# Patient Record
Sex: Female | Born: 1965 | Race: Black or African American | Hispanic: No | Smoking: Current every day smoker
Health system: Southern US, Community
[De-identification: ages and names within clinical notes are randomized; demographics above are authoritative.]

## PROBLEM LIST (undated history)

## (undated) DIAGNOSIS — E119 Type 2 diabetes mellitus without complications: Secondary | ICD-10-CM

## (undated) DIAGNOSIS — I10 Essential (primary) hypertension: Secondary | ICD-10-CM

## (undated) DIAGNOSIS — J9621 Acute and chronic respiratory failure with hypoxia: Secondary | ICD-10-CM

## (undated) DIAGNOSIS — J449 Chronic obstructive pulmonary disease, unspecified: Secondary | ICD-10-CM

## (undated) DIAGNOSIS — J45909 Unspecified asthma, uncomplicated: Secondary | ICD-10-CM

## (undated) HISTORY — PX: OTHER SURGICAL HISTORY: SHX169

## (undated) HISTORY — DX: Acute and chronic respiratory failure with hypoxia: J96.21

---

## 2004-11-29 ENCOUNTER — Emergency Department: Payer: Self-pay | Admitting: Emergency Medicine

## 2005-02-04 ENCOUNTER — Emergency Department: Payer: Self-pay | Admitting: Emergency Medicine

## 2005-02-15 ENCOUNTER — Emergency Department: Payer: Self-pay | Admitting: Unknown Physician Specialty

## 2005-03-09 ENCOUNTER — Emergency Department: Payer: Self-pay | Admitting: Emergency Medicine

## 2005-04-15 ENCOUNTER — Emergency Department: Payer: Self-pay | Admitting: Emergency Medicine

## 2005-04-30 ENCOUNTER — Emergency Department: Payer: Self-pay | Admitting: Emergency Medicine

## 2005-07-19 ENCOUNTER — Emergency Department: Payer: Self-pay | Admitting: Emergency Medicine

## 2005-08-22 ENCOUNTER — Emergency Department: Payer: Self-pay | Admitting: Emergency Medicine

## 2005-08-23 ENCOUNTER — Emergency Department: Payer: Self-pay | Admitting: Emergency Medicine

## 2005-09-09 ENCOUNTER — Emergency Department: Payer: Self-pay | Admitting: General Practice

## 2005-11-07 ENCOUNTER — Emergency Department: Payer: Self-pay | Admitting: Emergency Medicine

## 2005-11-27 ENCOUNTER — Emergency Department: Payer: Self-pay | Admitting: Internal Medicine

## 2005-12-19 ENCOUNTER — Emergency Department: Payer: Self-pay | Admitting: Emergency Medicine

## 2005-12-30 ENCOUNTER — Emergency Department: Payer: Self-pay | Admitting: Internal Medicine

## 2006-01-29 ENCOUNTER — Emergency Department: Payer: Self-pay | Admitting: Unknown Physician Specialty

## 2006-02-15 ENCOUNTER — Emergency Department: Payer: Self-pay | Admitting: Emergency Medicine

## 2006-03-01 ENCOUNTER — Inpatient Hospital Stay: Payer: Self-pay | Admitting: Internal Medicine

## 2006-03-01 ENCOUNTER — Other Ambulatory Visit: Payer: Self-pay

## 2006-04-21 ENCOUNTER — Emergency Department: Payer: Self-pay | Admitting: Emergency Medicine

## 2006-05-16 ENCOUNTER — Other Ambulatory Visit: Payer: Self-pay

## 2006-05-16 ENCOUNTER — Emergency Department: Payer: Self-pay | Admitting: Emergency Medicine

## 2006-05-30 ENCOUNTER — Emergency Department: Payer: Self-pay | Admitting: Emergency Medicine

## 2006-06-12 ENCOUNTER — Emergency Department: Payer: Self-pay | Admitting: Emergency Medicine

## 2006-06-16 ENCOUNTER — Inpatient Hospital Stay: Payer: Self-pay | Admitting: Internal Medicine

## 2006-07-18 ENCOUNTER — Emergency Department: Payer: Self-pay | Admitting: Emergency Medicine

## 2006-07-31 ENCOUNTER — Emergency Department: Payer: Self-pay | Admitting: Emergency Medicine

## 2006-08-14 ENCOUNTER — Emergency Department: Payer: Self-pay | Admitting: Emergency Medicine

## 2006-09-03 ENCOUNTER — Emergency Department: Payer: Self-pay | Admitting: Emergency Medicine

## 2006-09-24 ENCOUNTER — Emergency Department: Payer: Self-pay | Admitting: Emergency Medicine

## 2006-10-04 ENCOUNTER — Emergency Department: Payer: Self-pay | Admitting: Emergency Medicine

## 2006-10-22 ENCOUNTER — Emergency Department: Payer: Self-pay | Admitting: Emergency Medicine

## 2006-11-09 ENCOUNTER — Emergency Department: Payer: Self-pay

## 2006-11-19 ENCOUNTER — Emergency Department: Payer: Self-pay | Admitting: Emergency Medicine

## 2006-12-18 ENCOUNTER — Inpatient Hospital Stay: Payer: Self-pay | Admitting: *Deleted

## 2006-12-18 ENCOUNTER — Other Ambulatory Visit: Payer: Self-pay

## 2006-12-20 ENCOUNTER — Other Ambulatory Visit: Payer: Self-pay

## 2006-12-21 ENCOUNTER — Inpatient Hospital Stay: Payer: Self-pay | Admitting: *Deleted

## 2006-12-23 IMAGING — CR DG CHEST 2V
1 series · 2 of 2 positions shown · non-contrast
Comparison: none

REASON FOR EXAM: Difficulty breathing
COMMENTS:

[Series 1: view not recorded · 0.17mm/px · 2 of 2 slices shown]
[im 1/2]
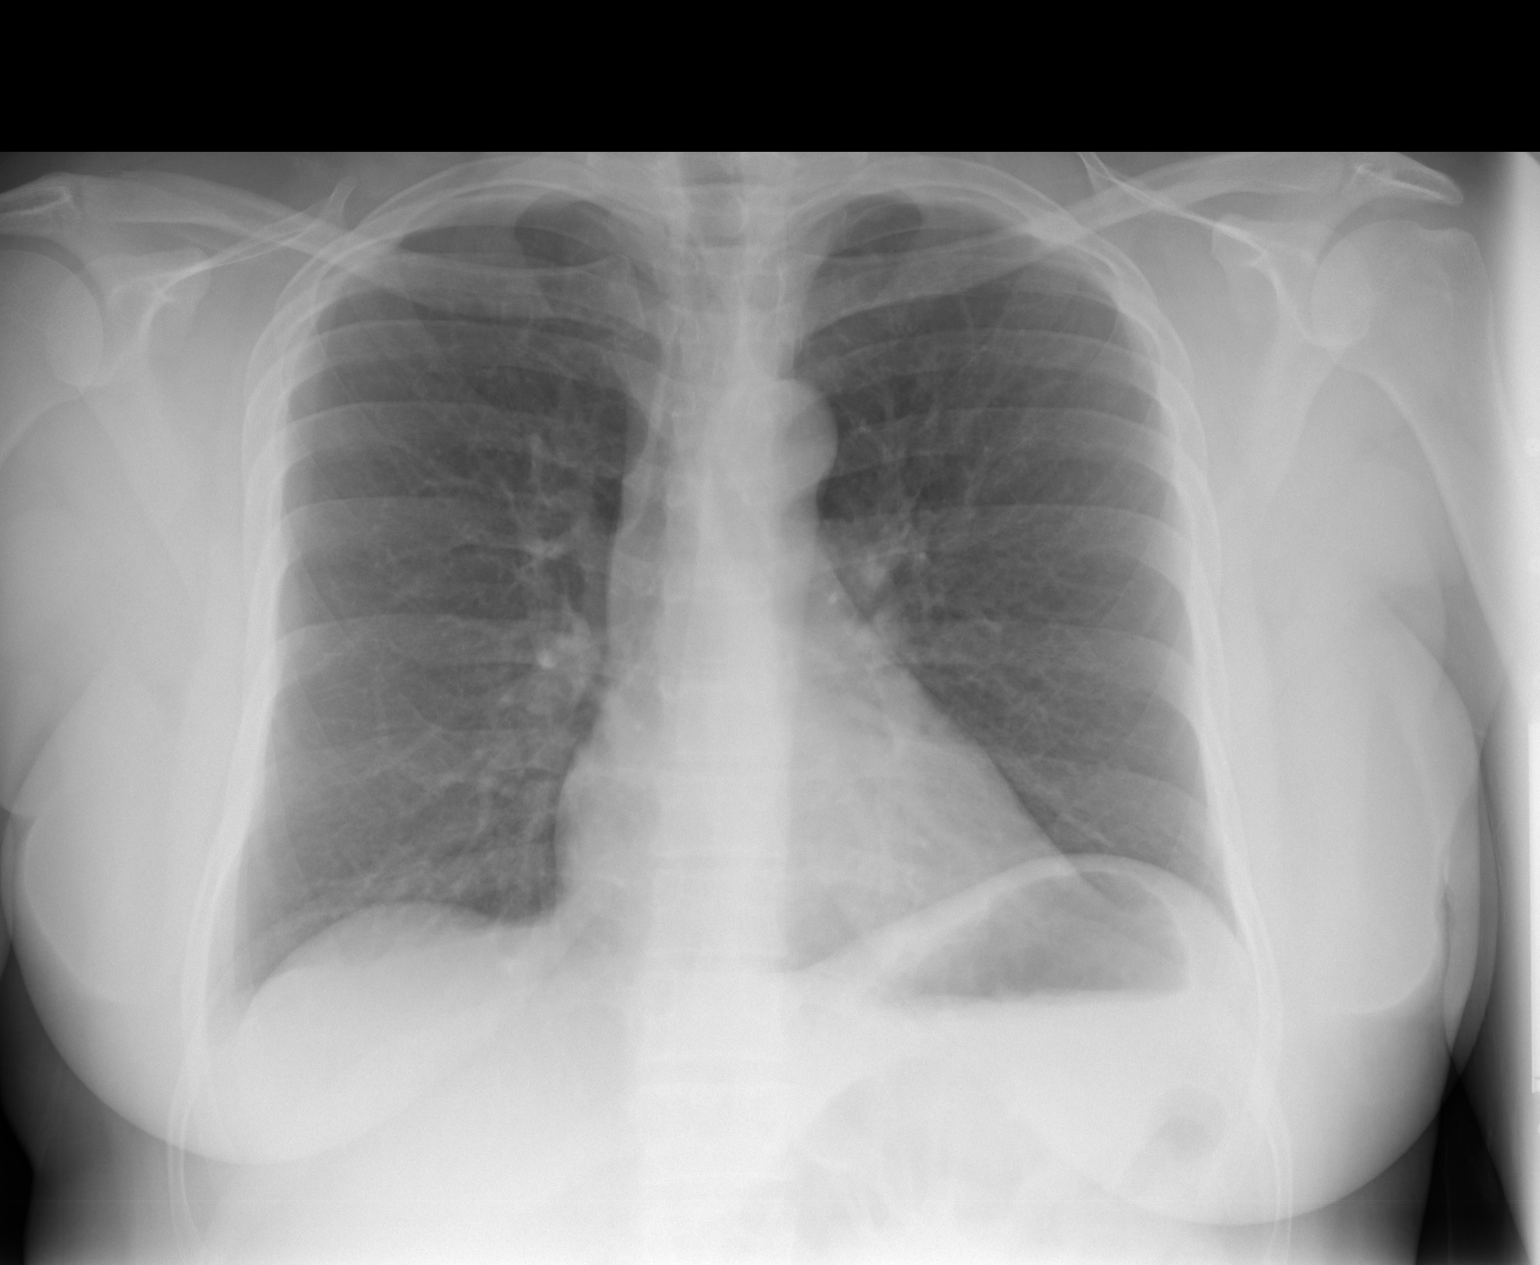
[im 2/2]
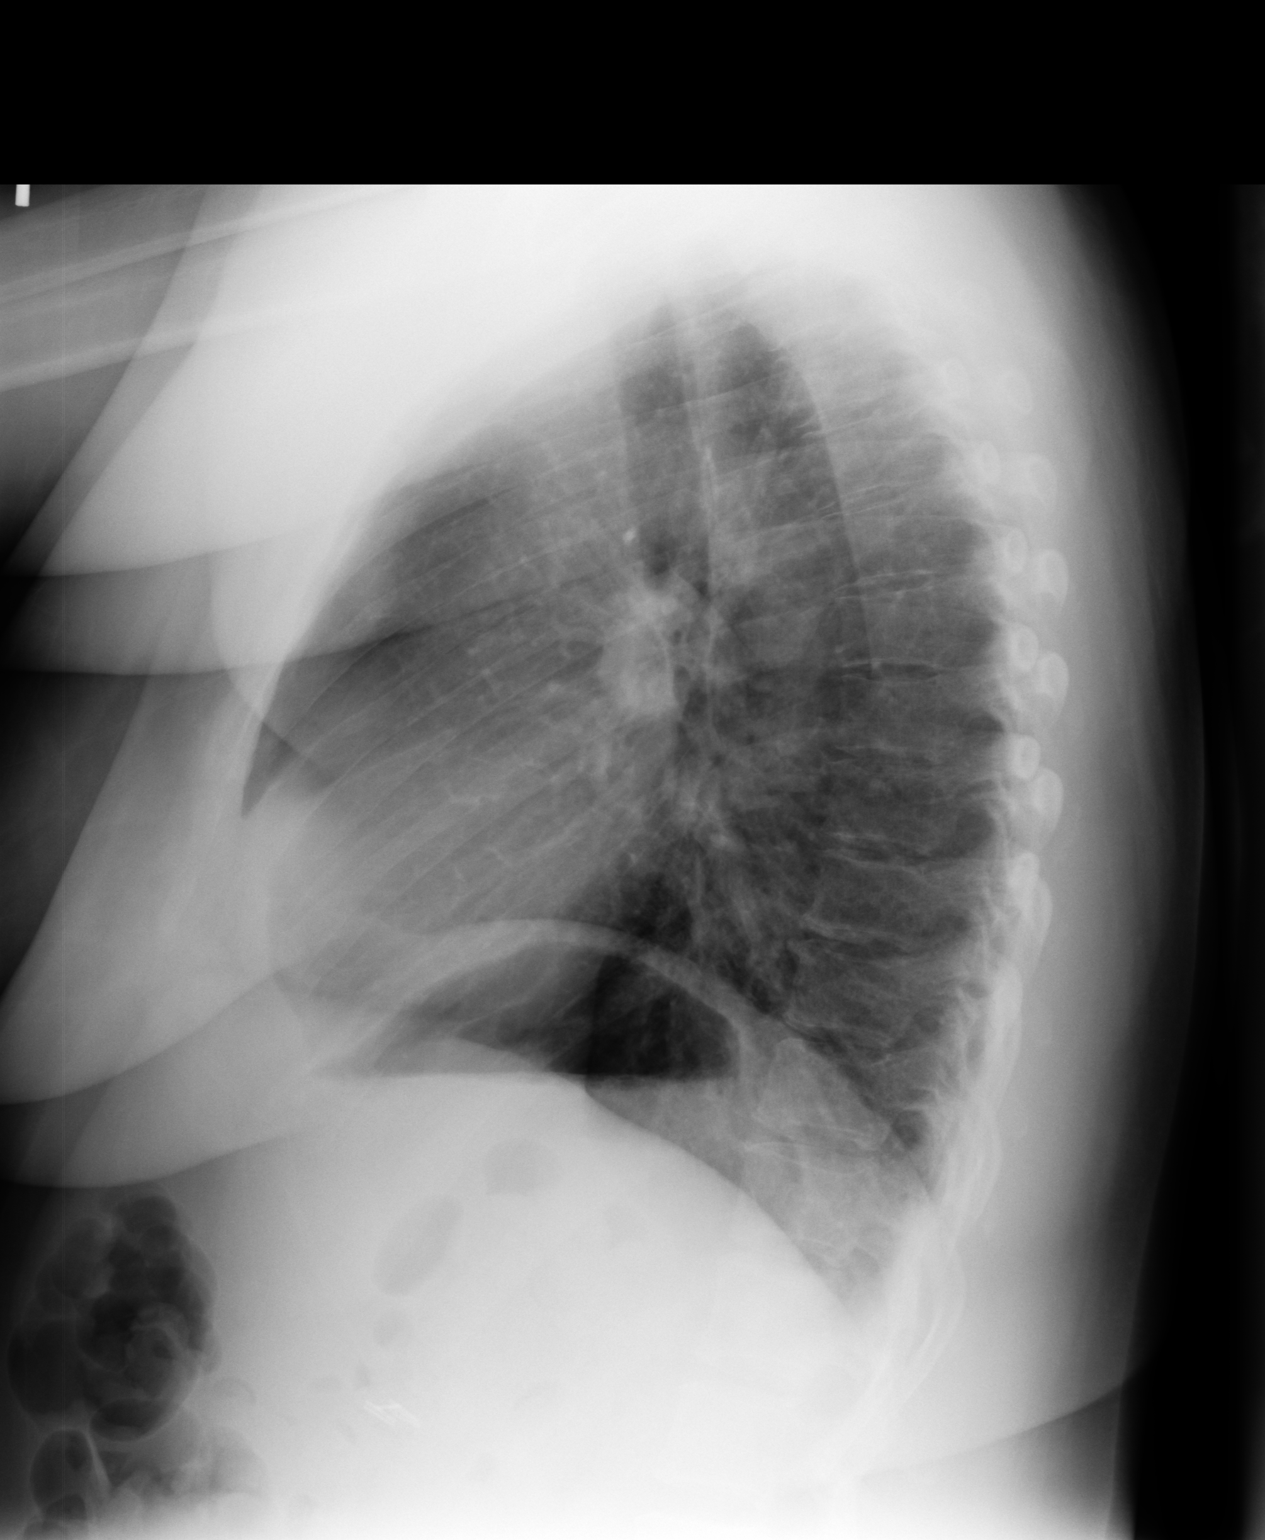

[2 of 2 positions shown; findings below may reference images not displayed]

PROCEDURE:     DXR - DXR CHEST PA (OR AP) AND LATERAL  - August 23, 2005  [DATE]

RESULT:     The current exam is compared to a prior exam of 02/15/2005.

The lung fields are clear. No pneumonia, pneumothorax or pleural effusion is
seen. The heart, mediastinal and osseous structures show no significant
abnormalities.
IMPRESSION: No significant abnormalities are noted.

## 2007-02-17 ENCOUNTER — Inpatient Hospital Stay: Payer: Self-pay | Admitting: Internal Medicine

## 2007-03-29 IMAGING — CR DG CHEST 2V
1 series · 2 of 2 positions shown · non-contrast
Comparison: none

REASON FOR EXAM: Difficulty breathing
COMMENTS:

PROCEDURE:     DXR - DXR CHEST PA (OR AP) AND LATERAL  - November 27, 2005  [DATE]
RESULT:          No acute cardiopulmonary disease.  The chest is stable from
08/23/05.

[Series 1: view not recorded · 0.17mm/px · 2 of 2 slices shown]
[im 1/2]
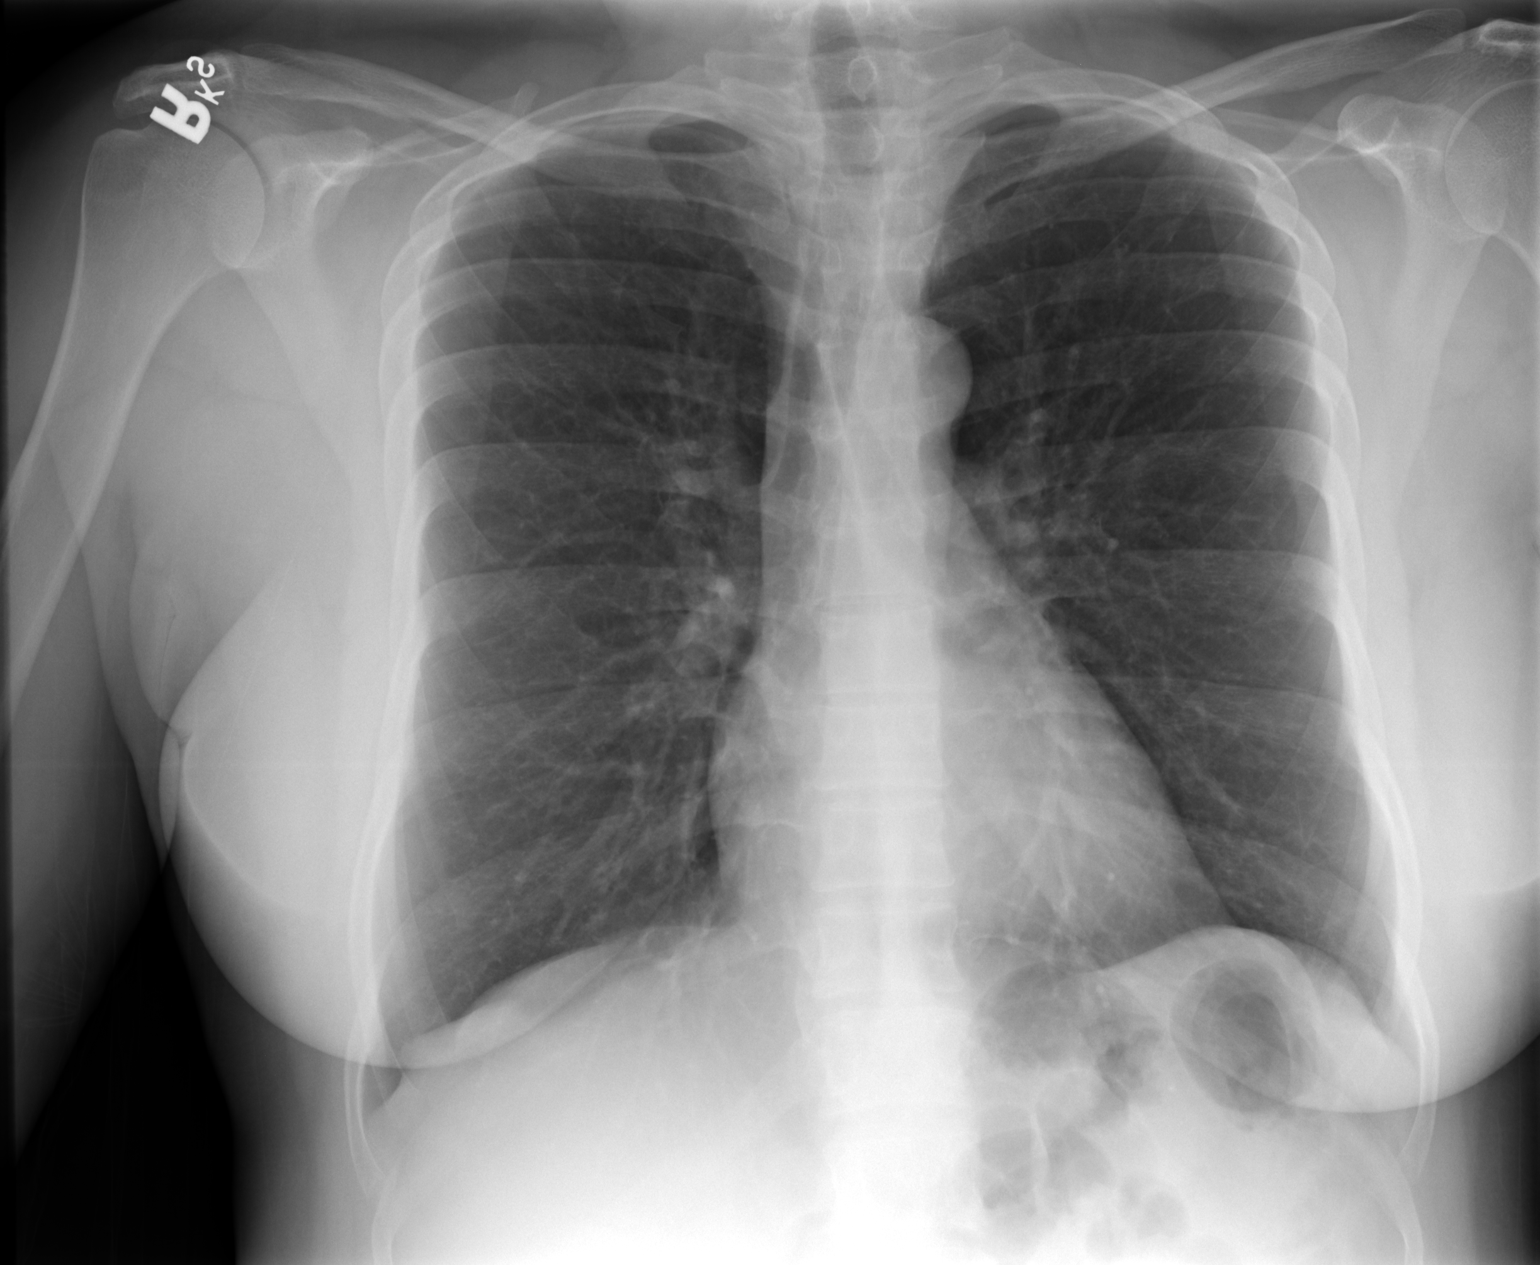
[im 2/2]
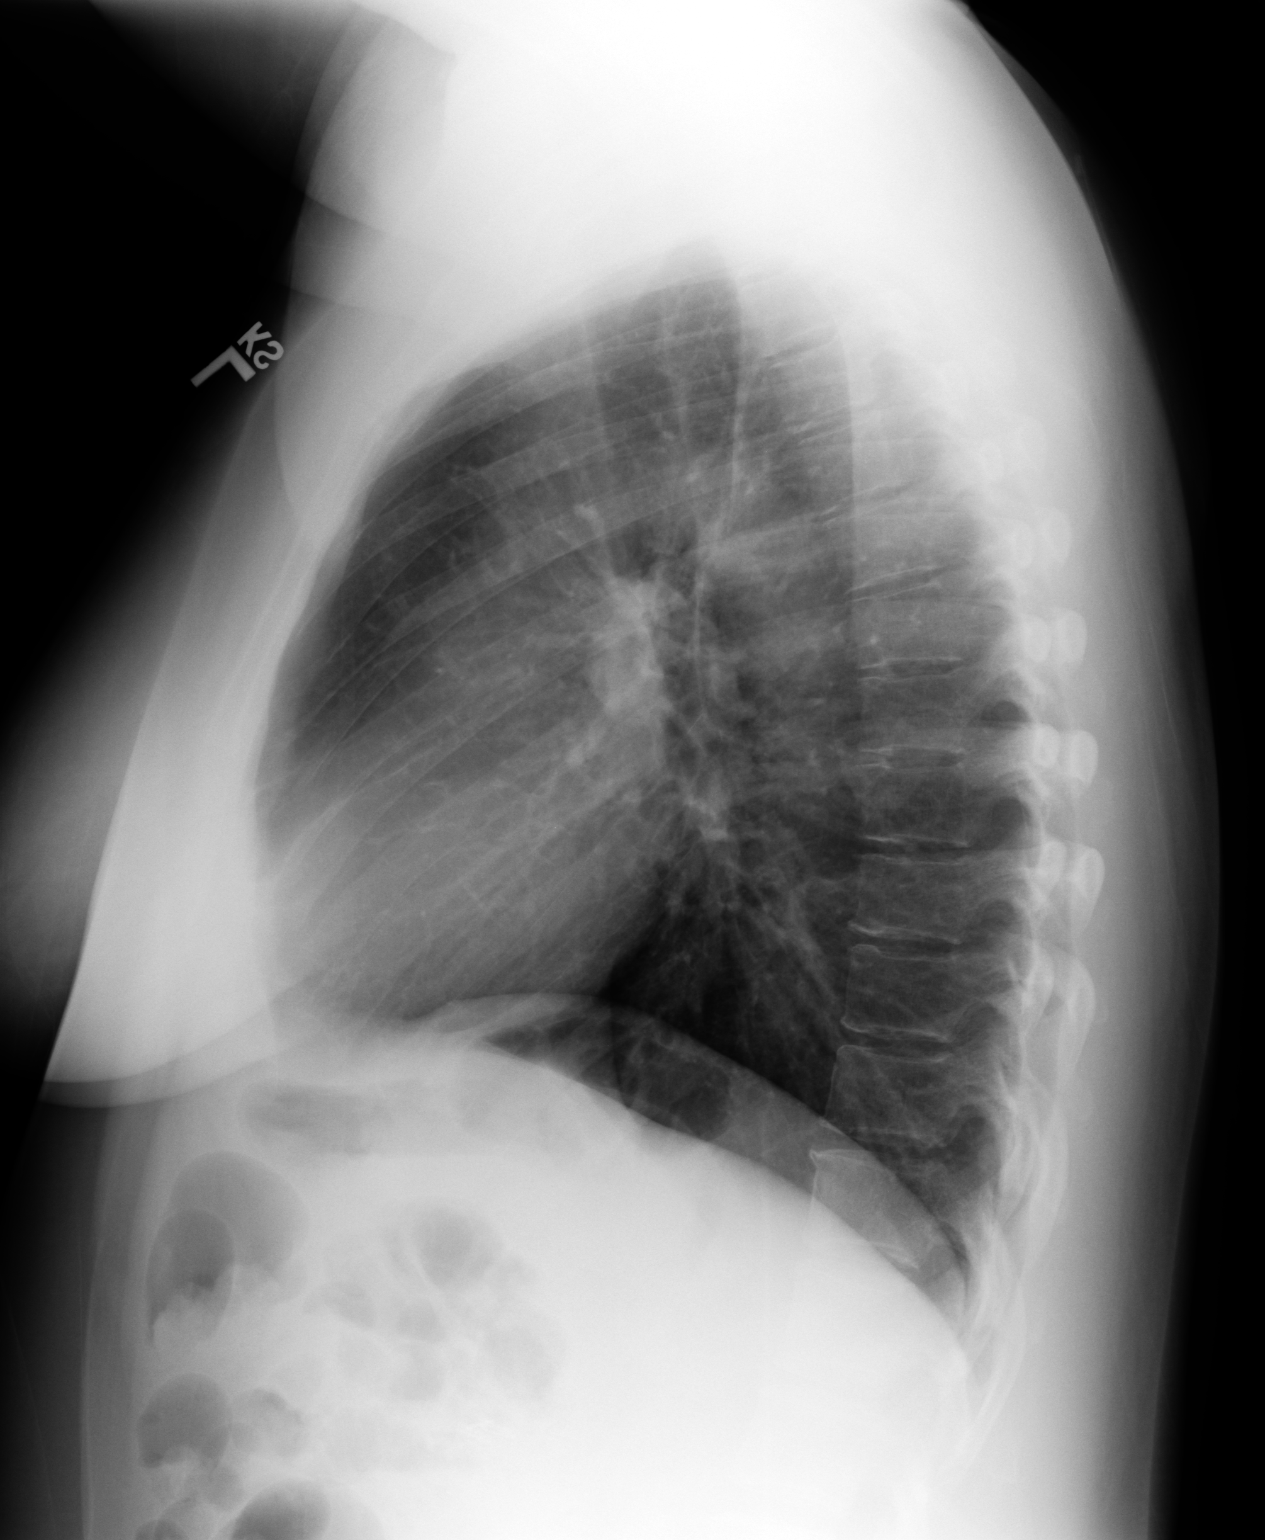

[2 of 2 positions shown; findings below may reference images not displayed]

IMPRESSION: No acute cardiopulmonary disease is evident.

## 2007-03-31 ENCOUNTER — Emergency Department: Payer: Self-pay | Admitting: Emergency Medicine

## 2007-04-08 ENCOUNTER — Emergency Department: Payer: Self-pay | Admitting: Emergency Medicine

## 2007-04-30 ENCOUNTER — Emergency Department: Payer: Self-pay | Admitting: Unknown Physician Specialty

## 2007-05-01 IMAGING — CR DG CHEST 2V
1 series · 2 of 2 positions shown · non-contrast
Comparison: none

REASON FOR EXAM: Difficulty breathing pt in rm 17
COMMENTS:

PROCEDURE:     DXR - DXR CHEST PA (OR AP) AND LATERAL  - December 30, 2005  [DATE]
RESULT:        The current exam is compared to a prior exam of 11/27/05.
The lung fields are clear.   The heart, mediastinal and osseous structures
are normal in appearance.

[Series 3343: postero_anterior · 0.11mm/px · 2 of 2 slices shown]
[im 1/2]
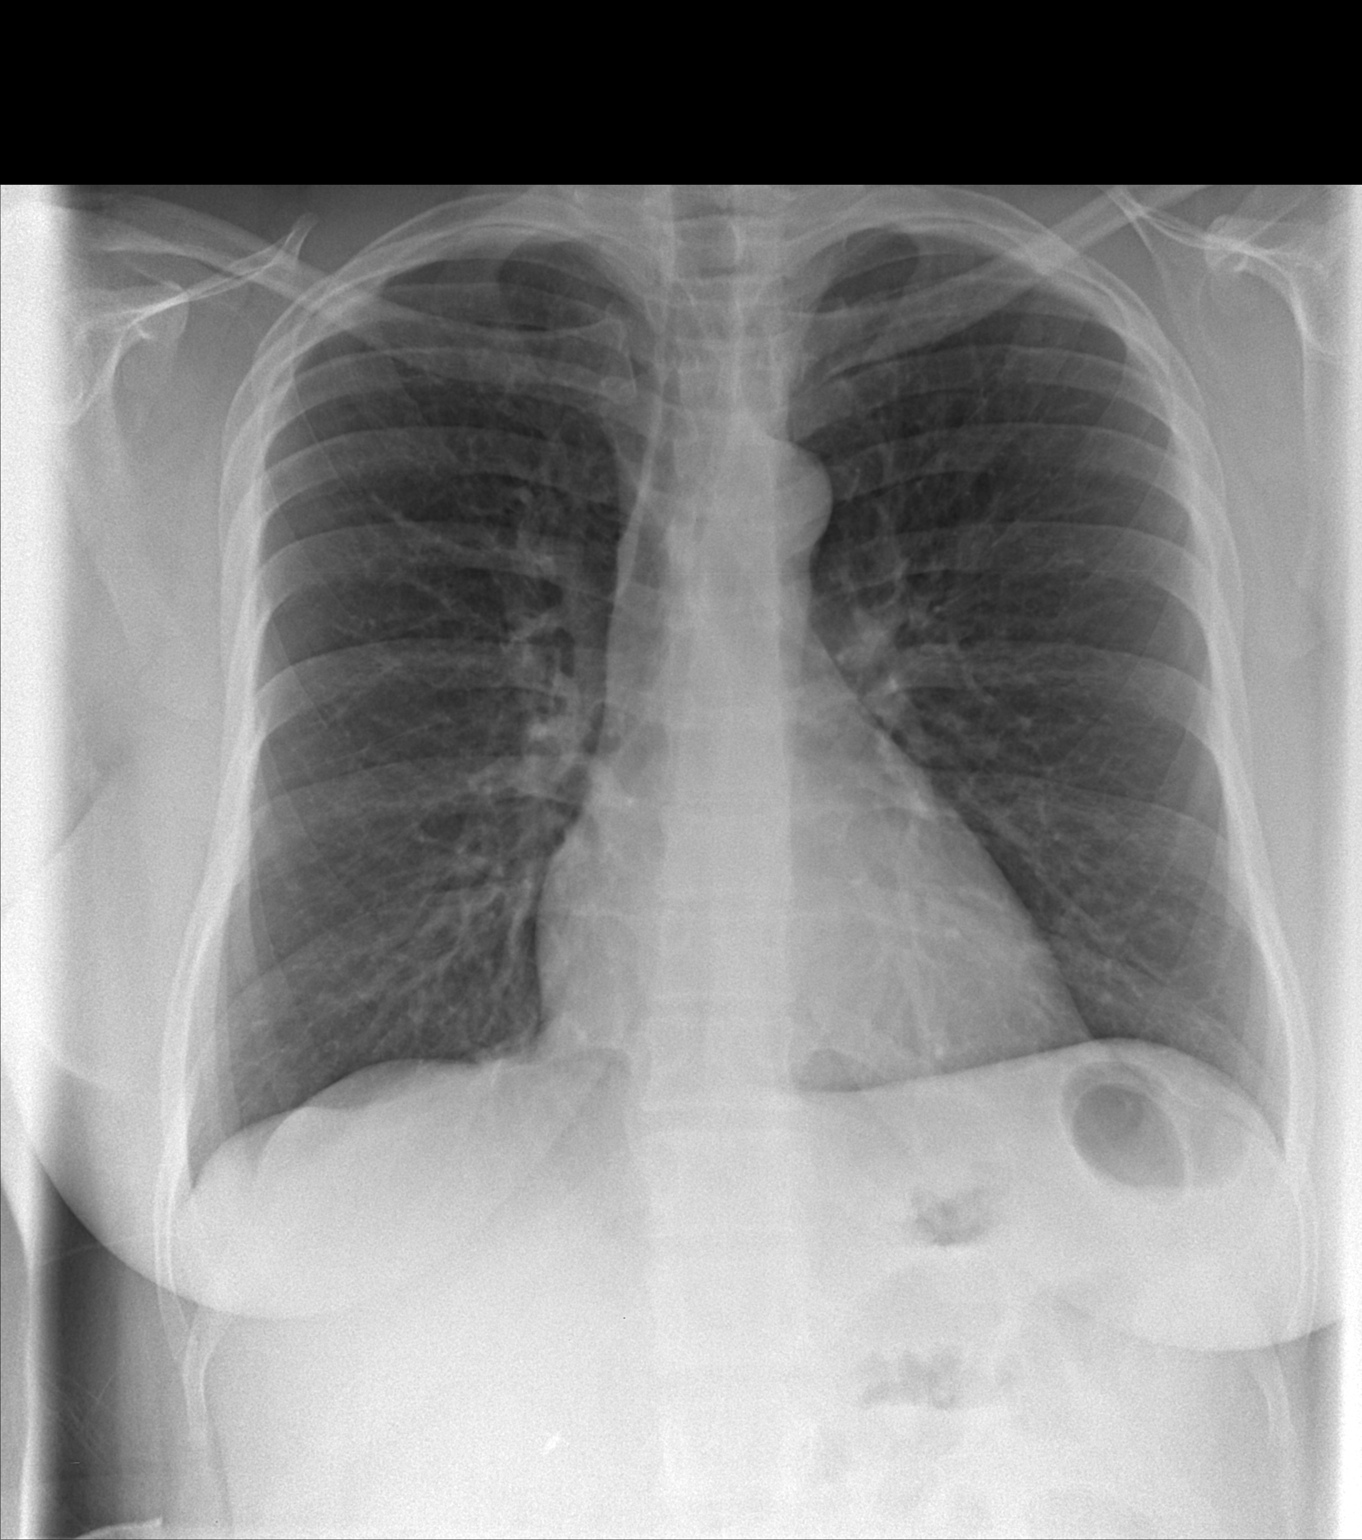
[im 2/2]
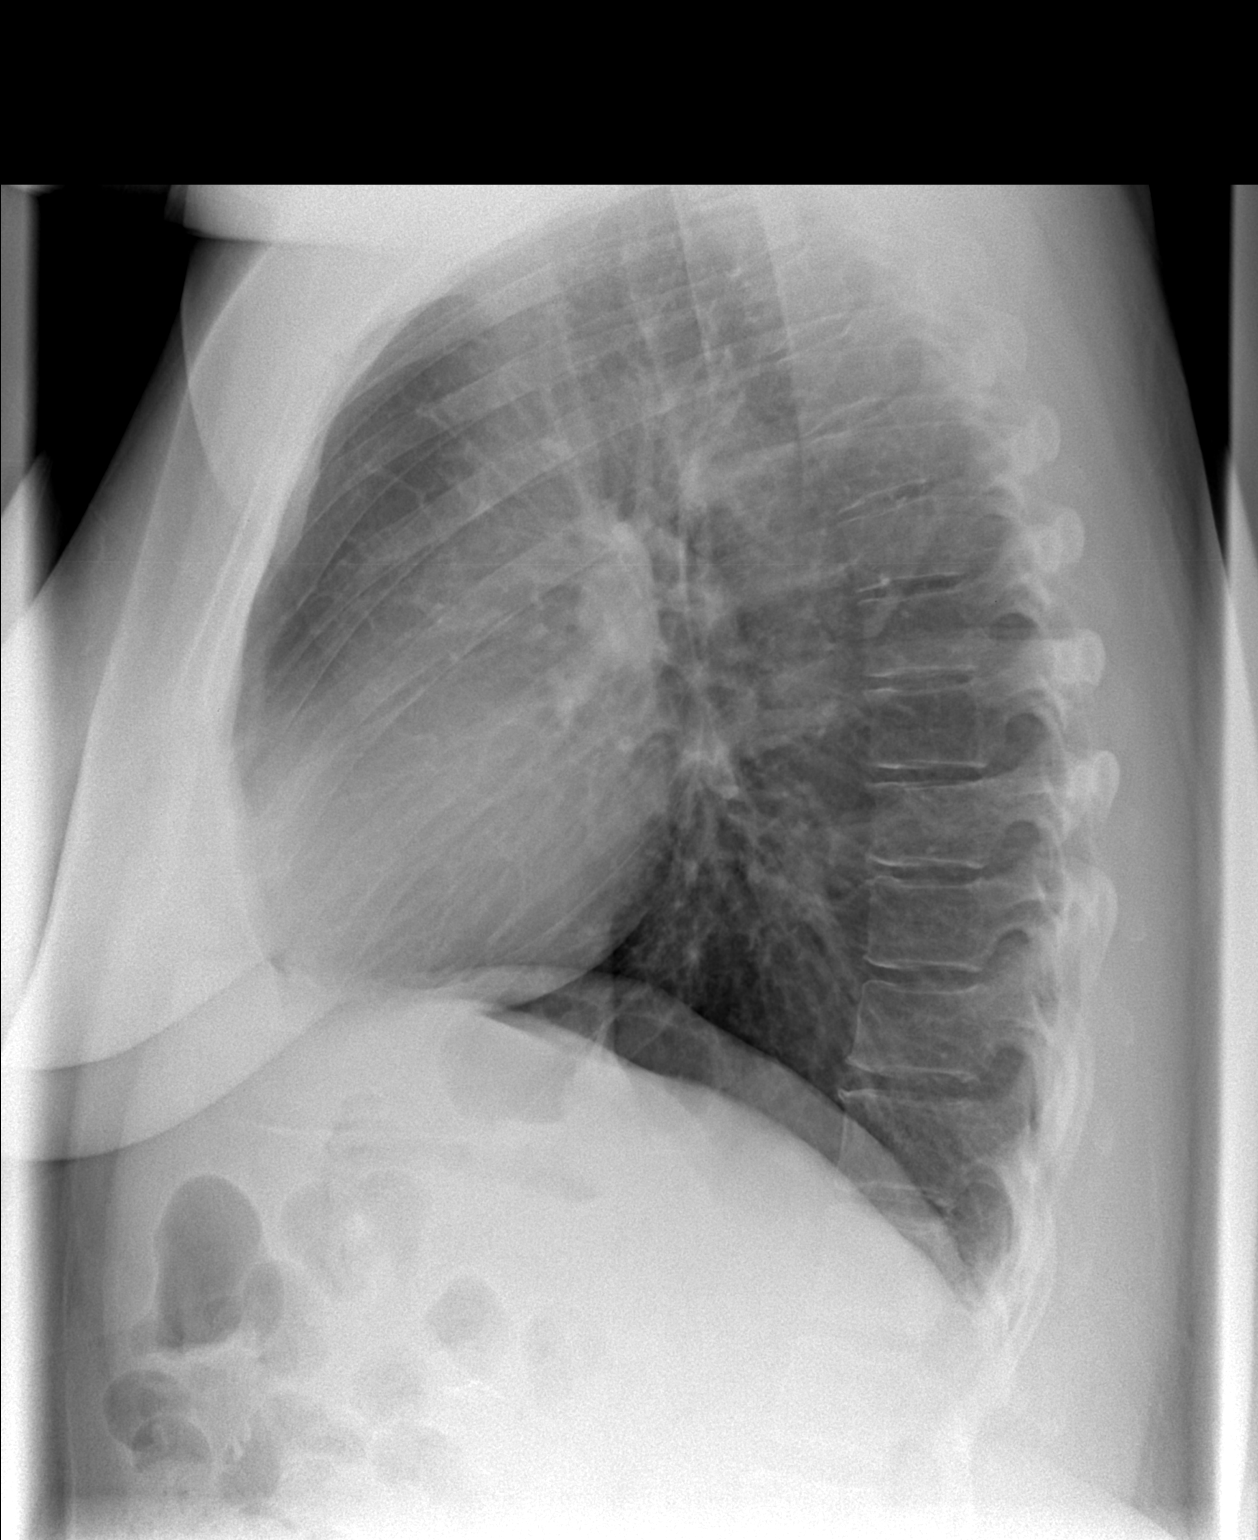

[2 of 2 positions shown; findings below may reference images not displayed]

IMPRESSION: No acute changes are identified.

## 2007-05-23 ENCOUNTER — Emergency Department: Payer: Self-pay | Admitting: Emergency Medicine

## 2007-05-31 IMAGING — CR DG CHEST 2V
1 series · 2 of 2 positions shown · non-contrast
Comparison: none

REASON FOR EXAM: Asthma
COMMENTS:  LMP: Three weeks ago

PROCEDURE:     DXR - DXR CHEST PA (OR AP) AND LATERAL  - January 29, 2006  [DATE]
RESULT:          Two views of the chest are compared to the study of
12/30/2005.
The lungs are clear.  The heart and pulmonary vessels are normal.  The bony
structures are unremarkable.

[Series 1: view not recorded · 0.17mm/px · 2 of 2 slices shown]
[im 1/2]
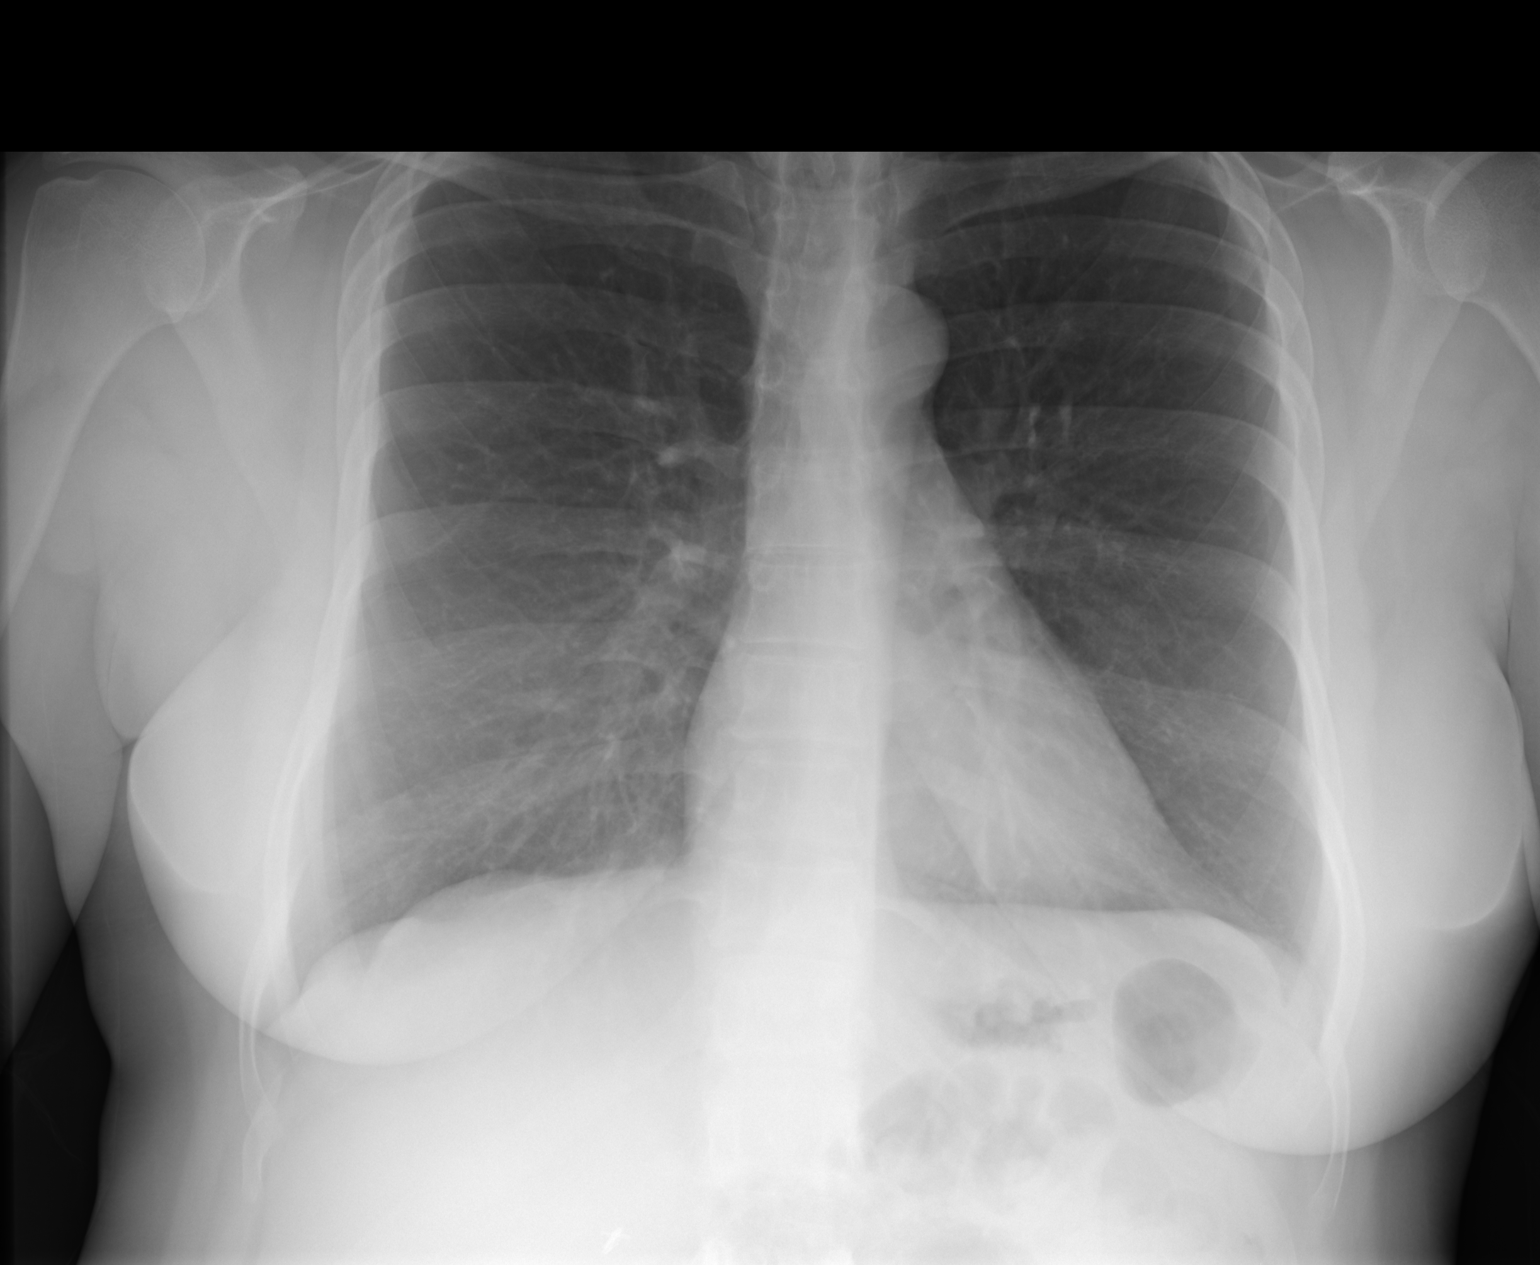
[im 2/2]
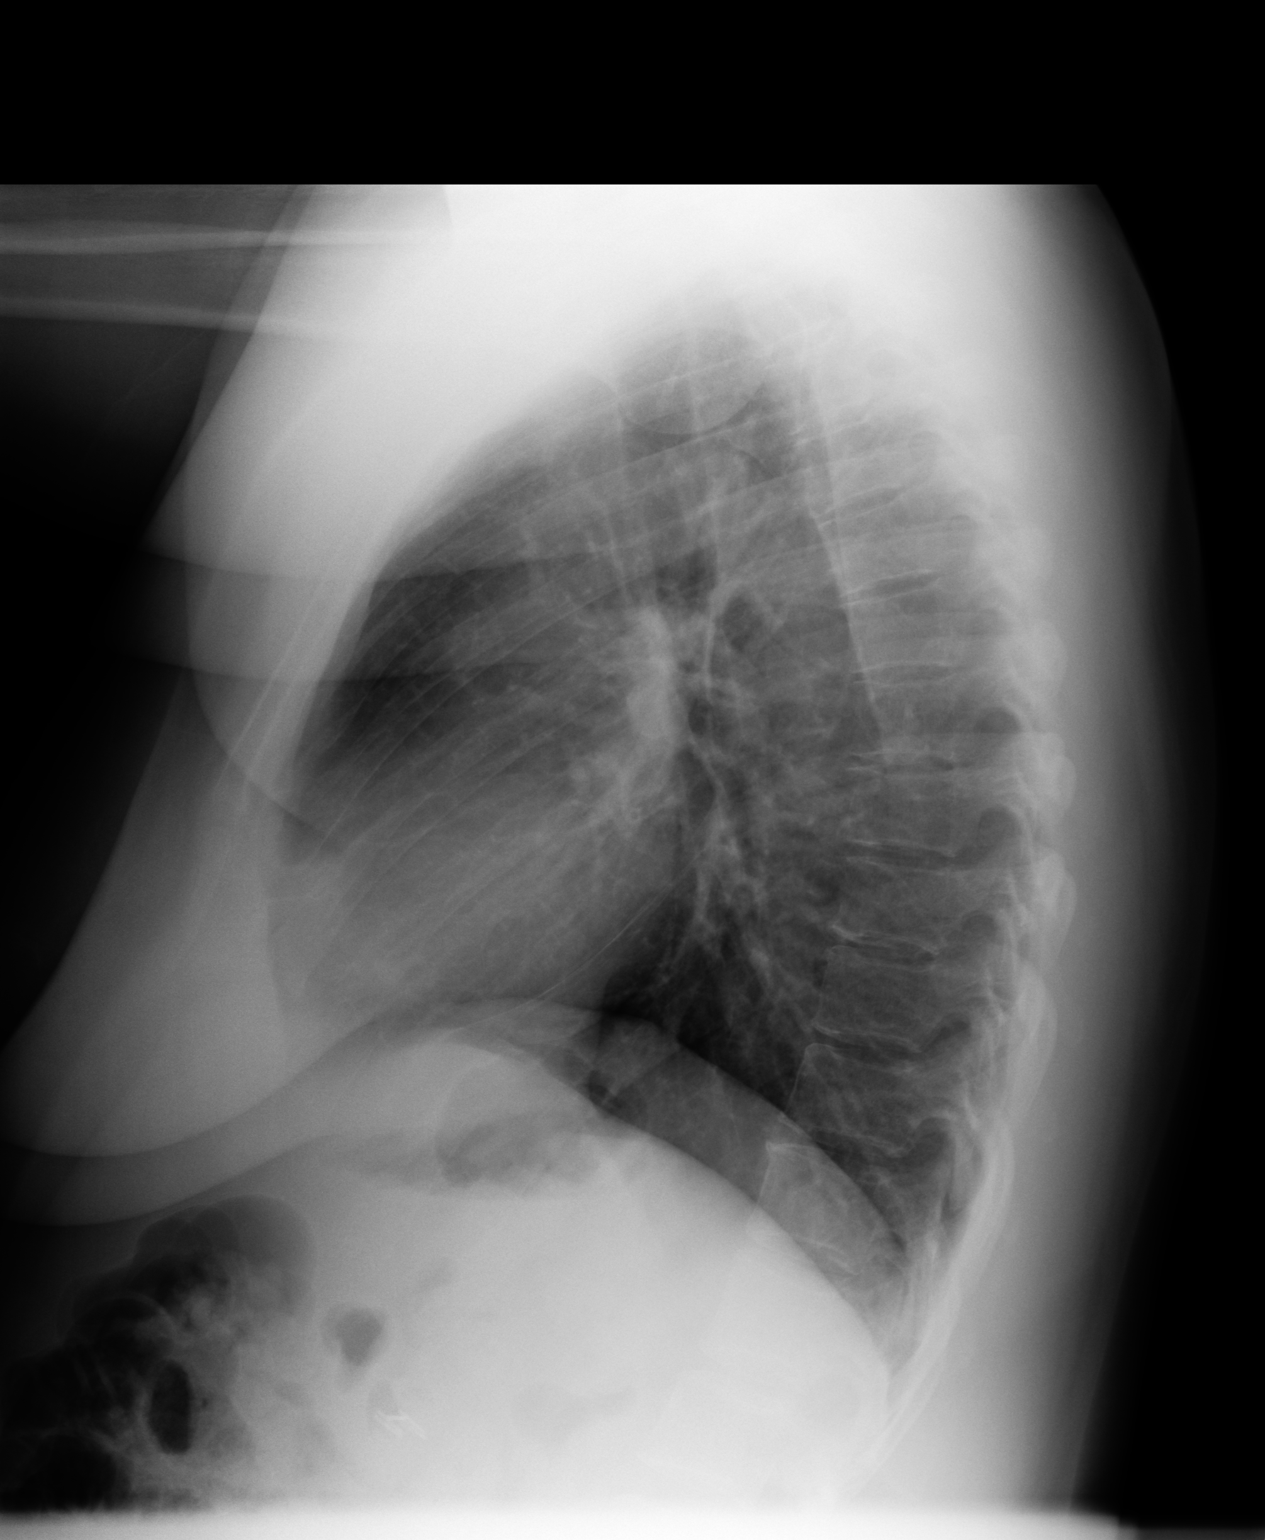

[2 of 2 positions shown; findings below may reference images not displayed]

IMPRESSION: No acute cardiopulmonary disease.

## 2007-06-17 IMAGING — CR DG CHEST 2V
1 series · 2 of 2 positions shown · non-contrast
Comparison: none

REASON FOR EXAM: Difficulty breathing
COMMENTS:

PROCEDURE:     DXR - DXR CHEST PA (OR AP) AND LATERAL  - February 15, 2006 [DATE]
RESULT:     The current exam is compared to the prior exam of 01/29/06.
The lung fields are clear.  The heart, mediastinal, and osseous structures
are normal in appearance.

[Series 4220: postero_anterior · 0.22mm/px · 2 of 2 slices shown]
[im 1/2]
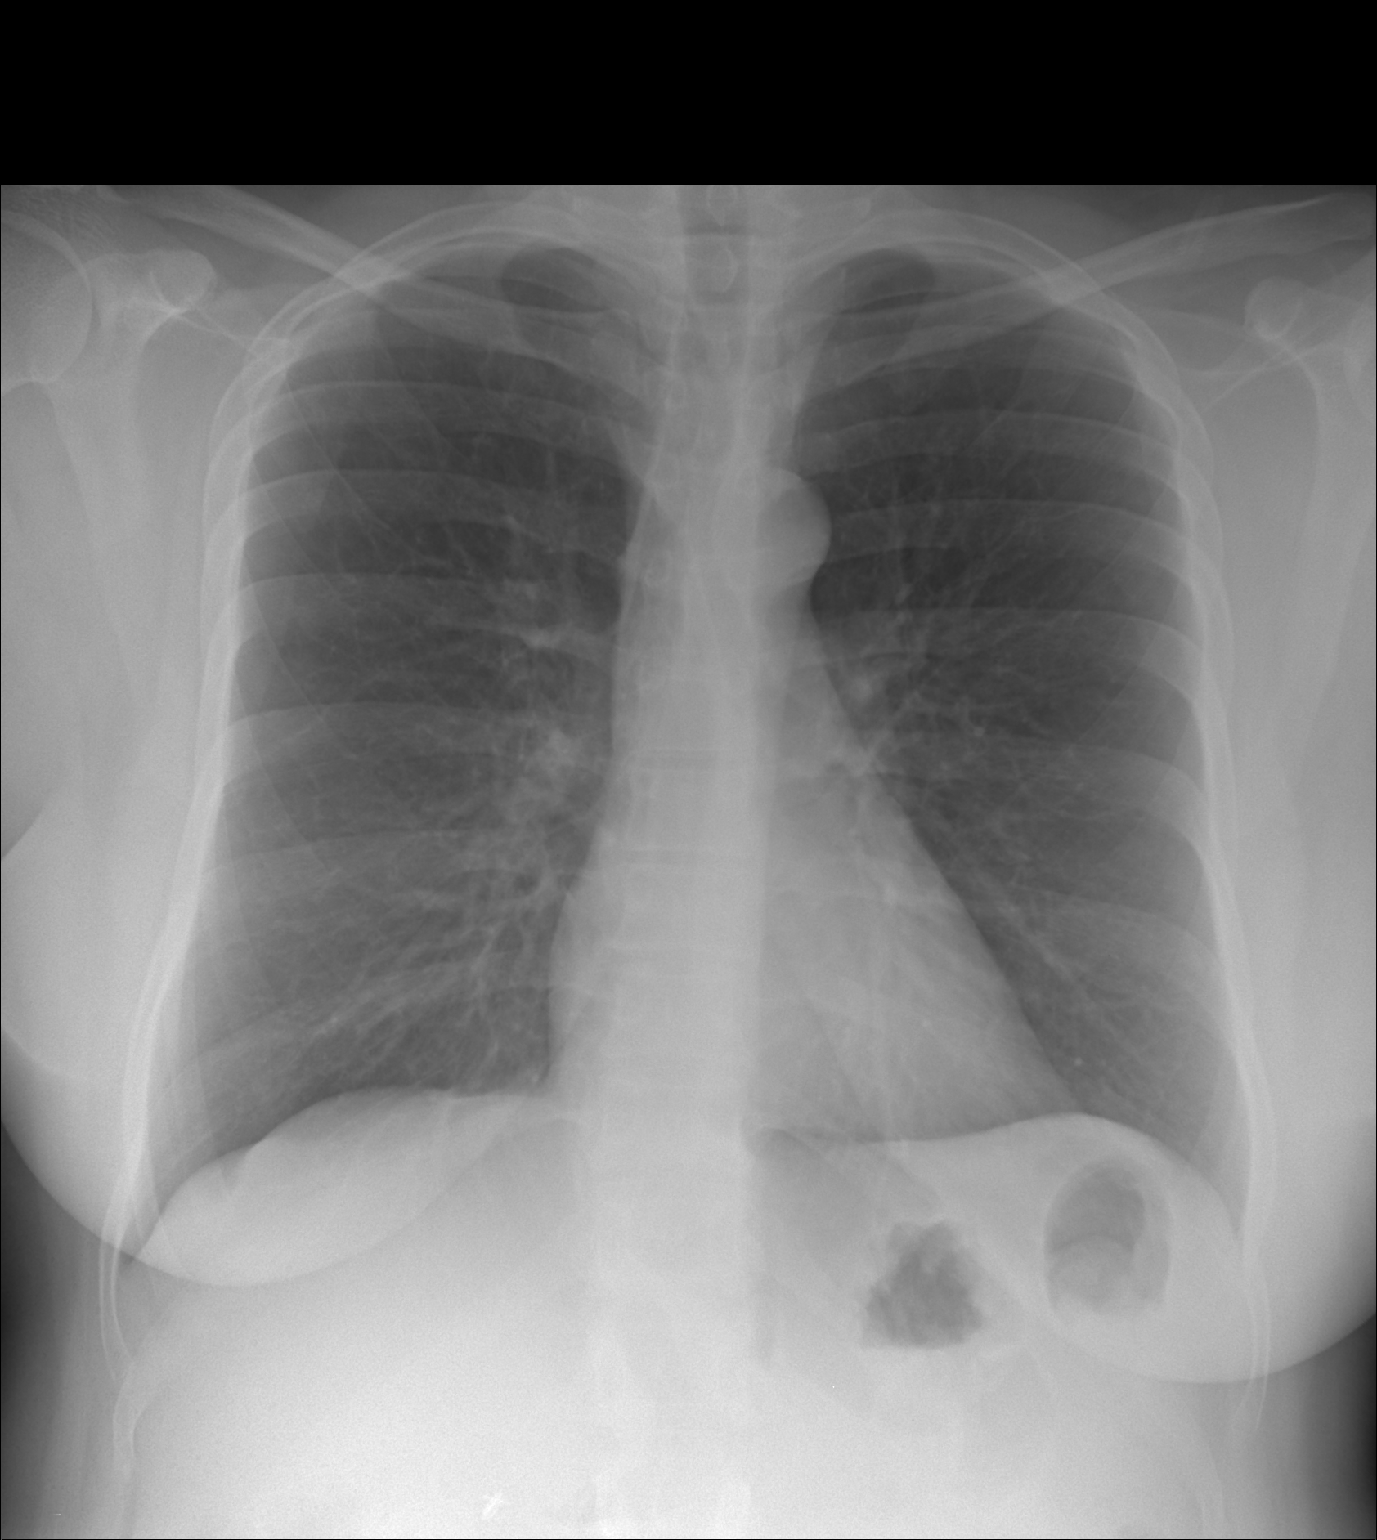
[im 2/2]
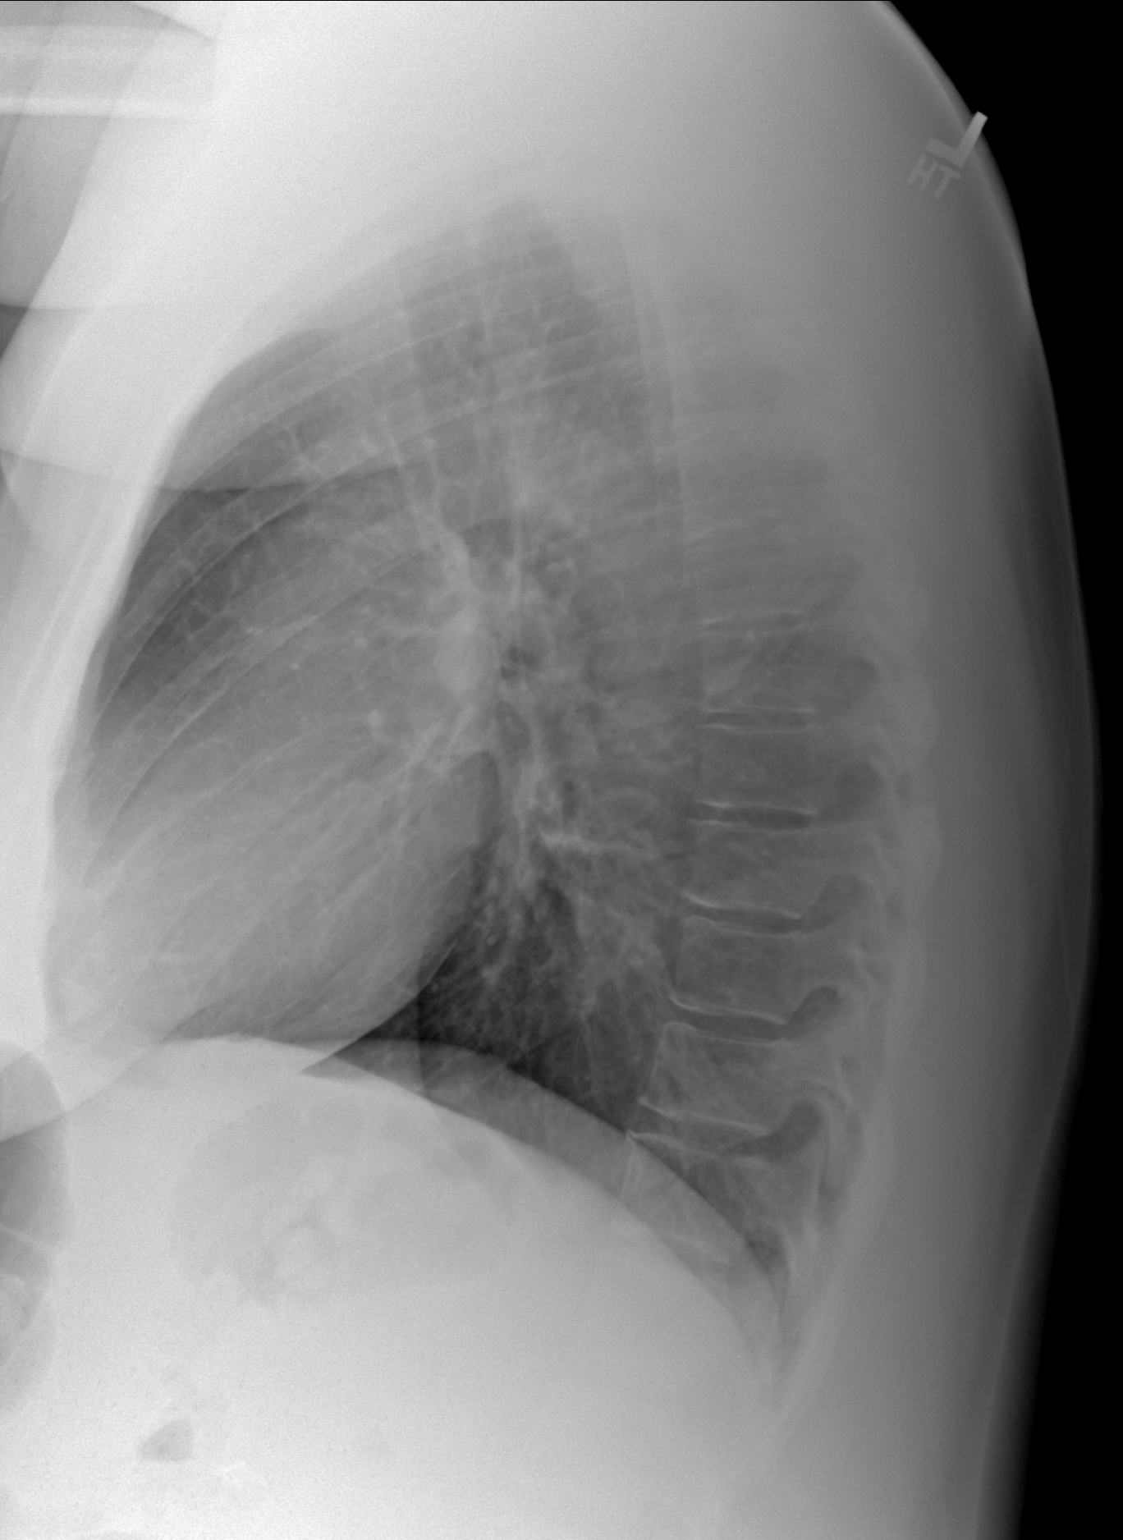

[2 of 2 positions shown; findings below may reference images not displayed]

IMPRESSION: No significant abnormalities are noted.

## 2007-06-20 ENCOUNTER — Emergency Department: Payer: Self-pay

## 2007-07-01 IMAGING — CR DG CHEST 1V PORT
1 series · 1 of 1 positions shown · non-contrast
Comparison: none

REASON FOR EXAM: Difficulty breathing
COMMENTS:

[view not recorded]
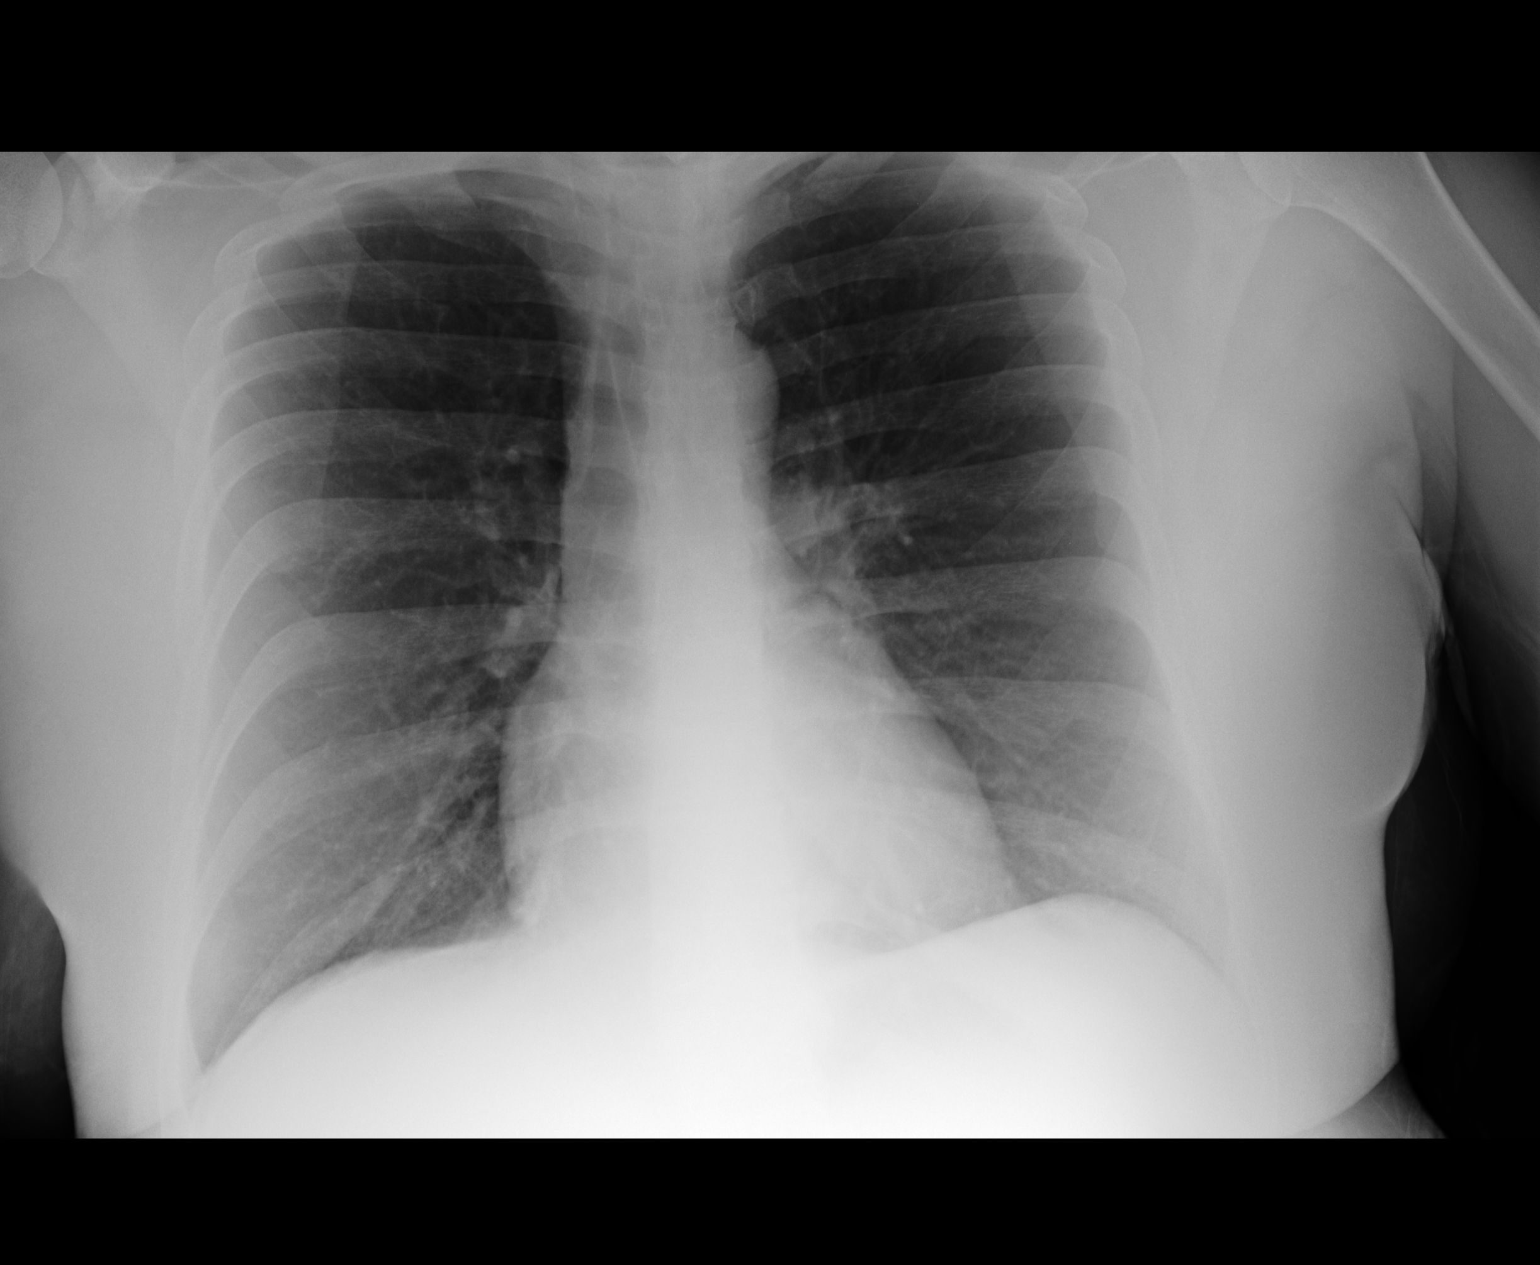

[1 of 1 positions shown; findings below may reference images not displayed]

PROCEDURE:     DXR - DXR PORTABLE CHEST SINGLE VIEW  - March 01, 2006  [DATE]

RESULT:       AP view of the chest is compared to a prior exam of 02/15/06.
The lung fields are clear.  The heart, mediastinal and osseous structures
show no significant abnormalities.   In this AP view, the chest appears
mildly hyperexpanded which would raise the question of a history of asthma
or COPD.
IMPRESSION: 1.     The lung fields are clear.
2.     The heart size is normal.
3.     The chest appears mildly hyperexpanded.

## 2007-07-17 ENCOUNTER — Emergency Department: Payer: Self-pay | Admitting: Emergency Medicine

## 2007-08-13 ENCOUNTER — Emergency Department: Payer: Self-pay | Admitting: Emergency Medicine

## 2007-08-29 ENCOUNTER — Inpatient Hospital Stay: Payer: Self-pay | Admitting: Pediatrics

## 2007-08-30 ENCOUNTER — Other Ambulatory Visit: Payer: Self-pay

## 2007-09-15 IMAGING — CR RIGHT ANKLE - COMPLETE 3+ VIEW
1 series · 5 of 5 positions shown · non-contrast
Comparison: none

REASON FOR EXAM: Injury
COMMENTS:  LMP: 2 weeks ago

PROCEDURE:     DXR - DXR ANKLE RIGHT COMPLETE  - May 16, 2006  [DATE]
RESULT:     There is a small amount of soft tissue swelling over the lateral
malleolus. The ankle joint mortise is preserved. The talar dome is intact.

[Series 1: view not recorded · 0.17mm/px · 5 of 5 slices shown]
[im 1/5]
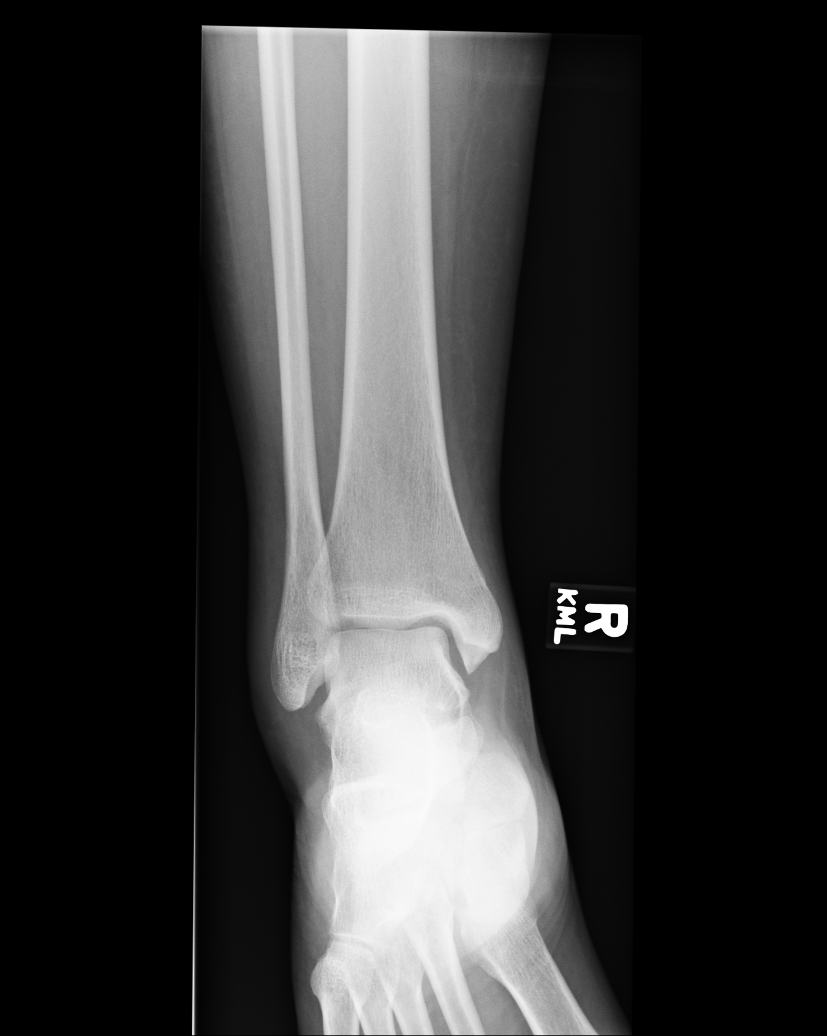
[im 2/5]
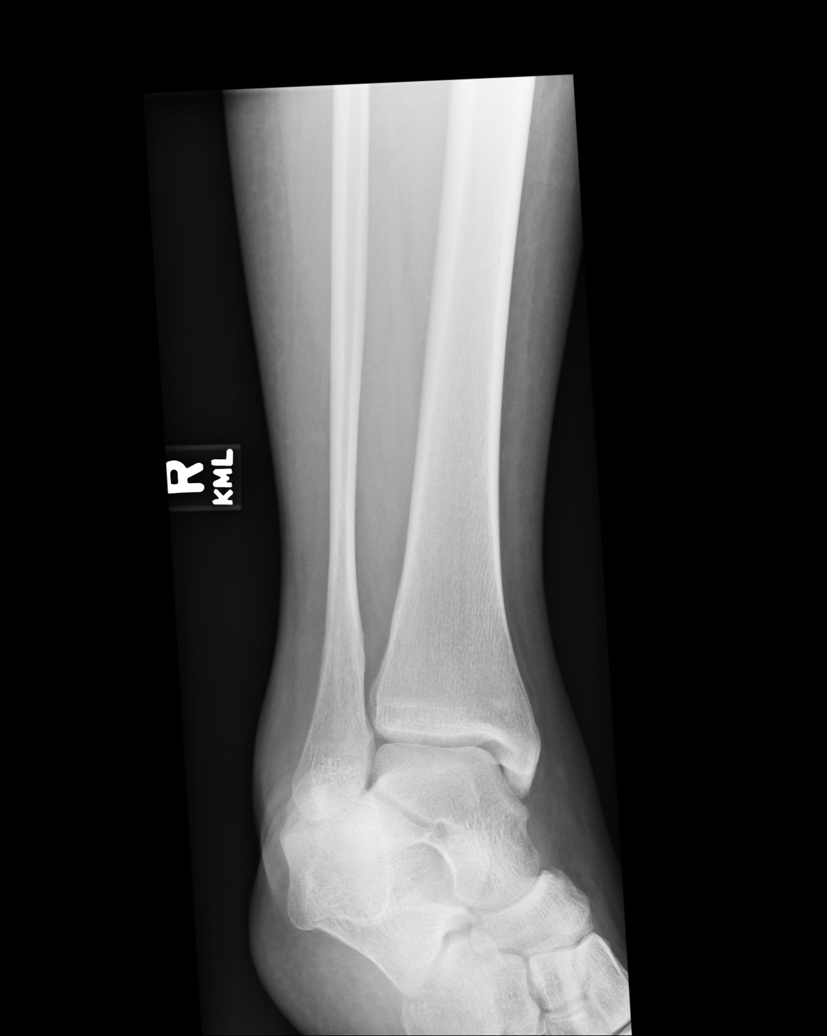
[im 3/5]
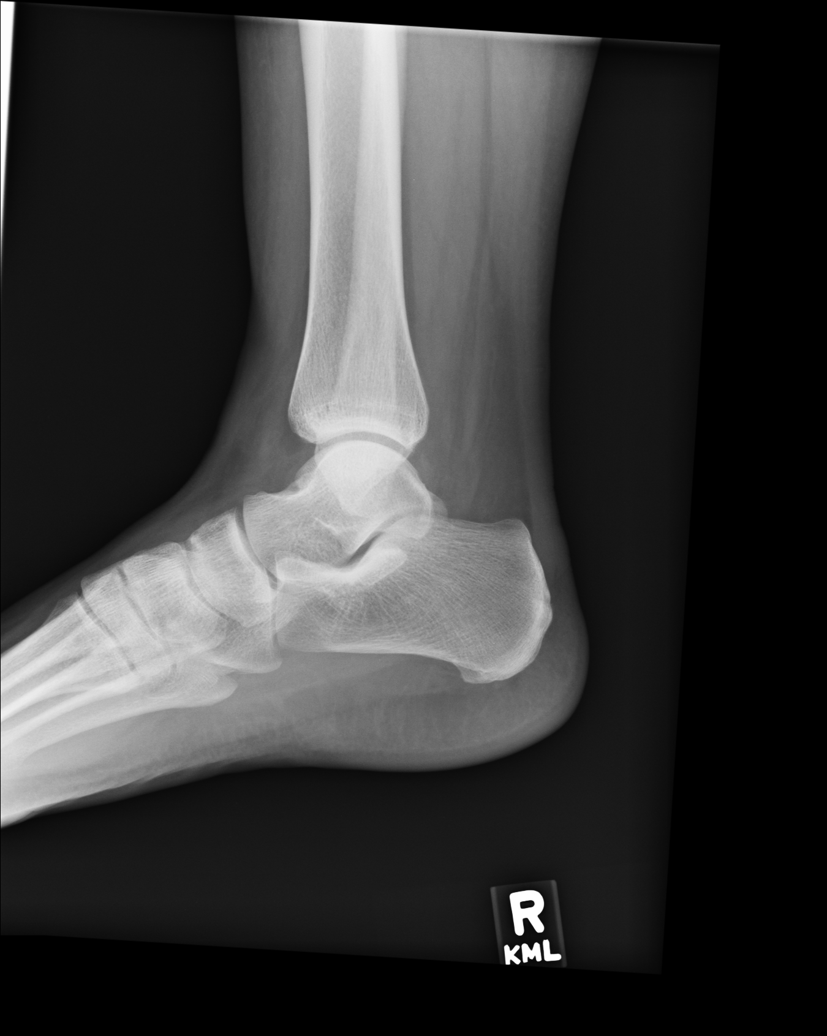
[im 4/5]
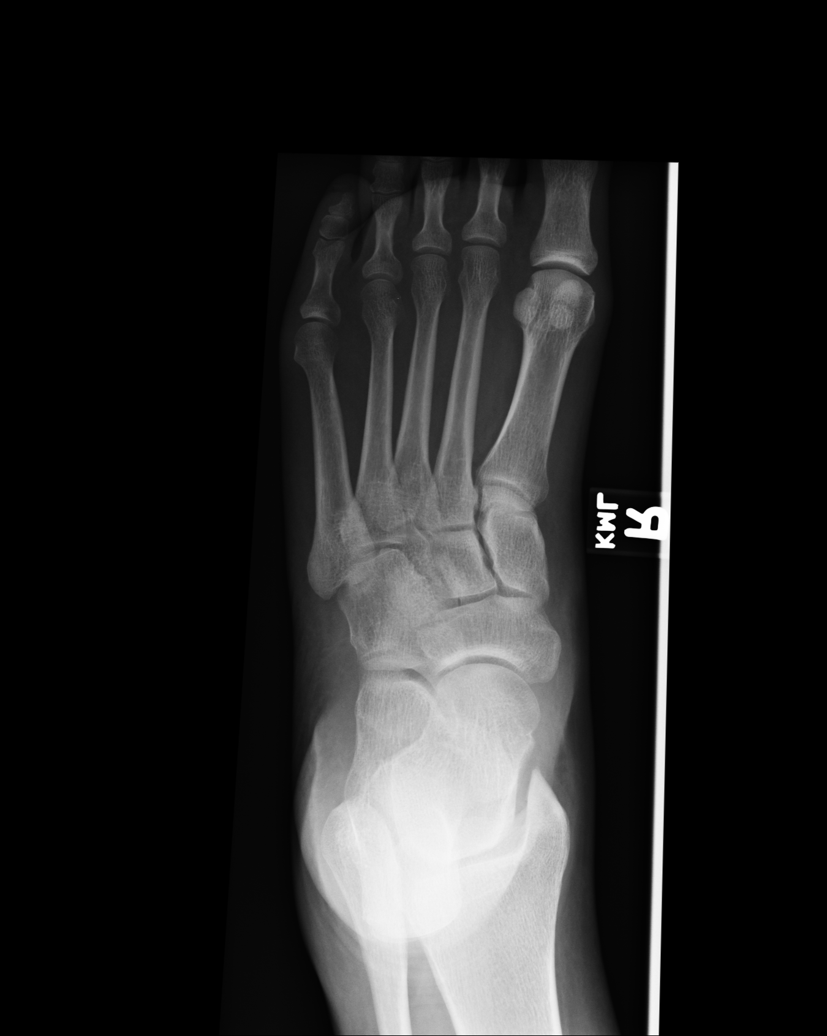
[im 5/5]
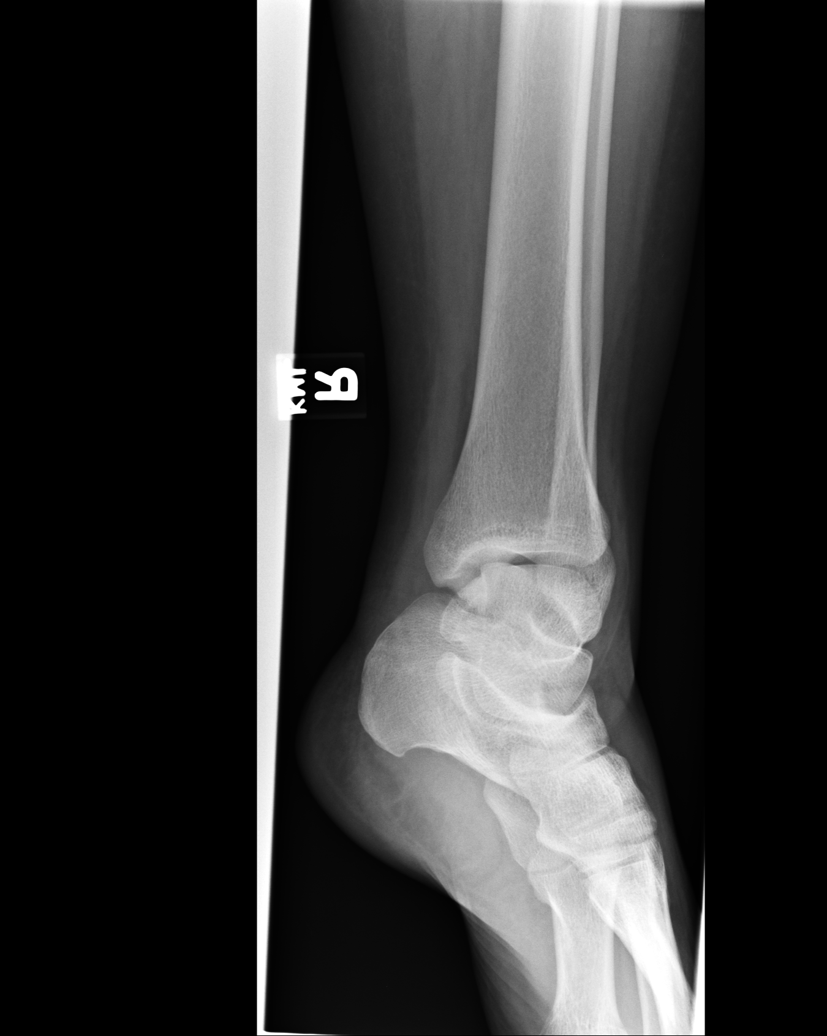

[5 of 5 positions shown; findings below may reference images not displayed]

IMPRESSION: I do not see evidence of an acute fracture. There is some
narrowing of the joint mortise. There is no disruption of the joint mortise.
There is soft tissue swelling over the lateral malleolus consistent with
sprain.

## 2007-09-15 IMAGING — CR DG CHEST 2V
1 series · 2 of 2 positions shown · non-contrast
Comparison: none

REASON FOR EXAM: SOB
COMMENTS:

PROCEDURE:     DXR - DXR CHEST PA (OR AP) AND LATERAL  - May 16, 2006  [DATE]
RESULT:     Comparison is made to the study of 03/01/06.
The lungs remain hyperinflated.  There is no focal infiltrate.  The heart is
normal in size and the pulmonary vascularity is not congested.

[Series 1: view not recorded · 0.17mm/px · 2 of 2 slices shown]
[im 1/2]
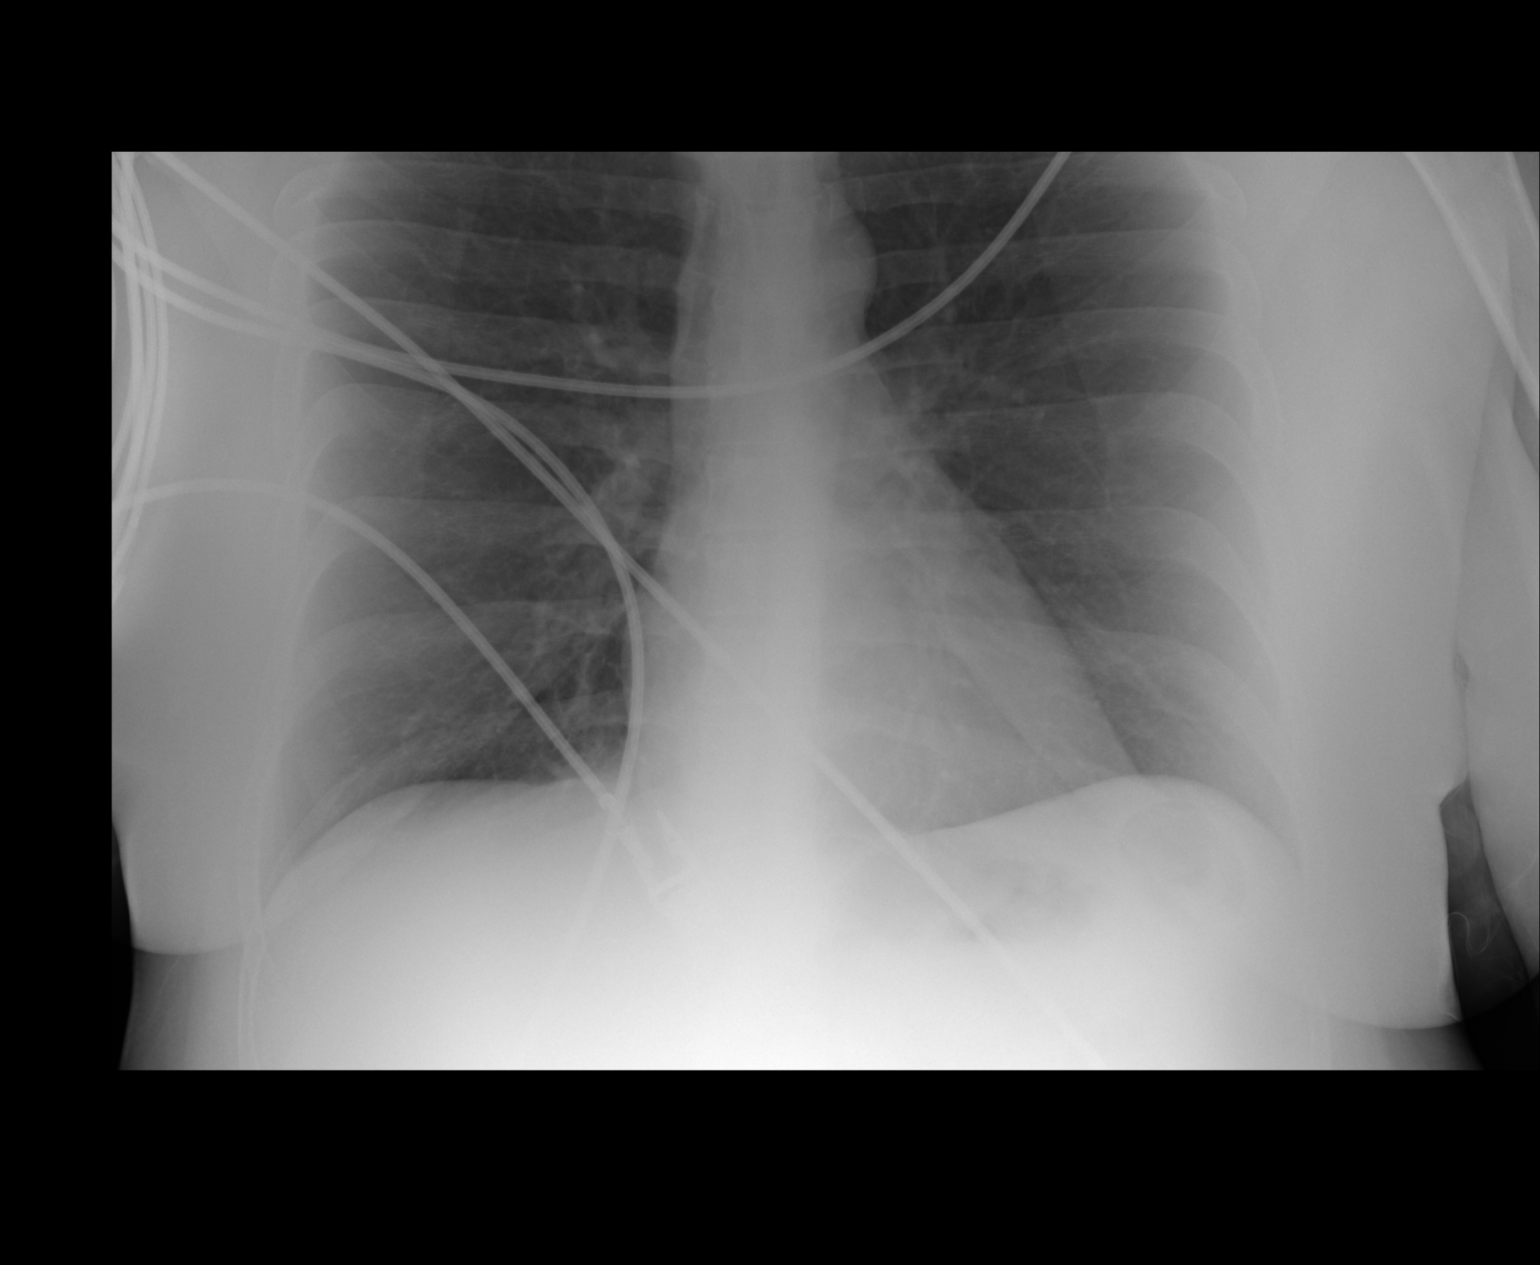
[im 2/2]
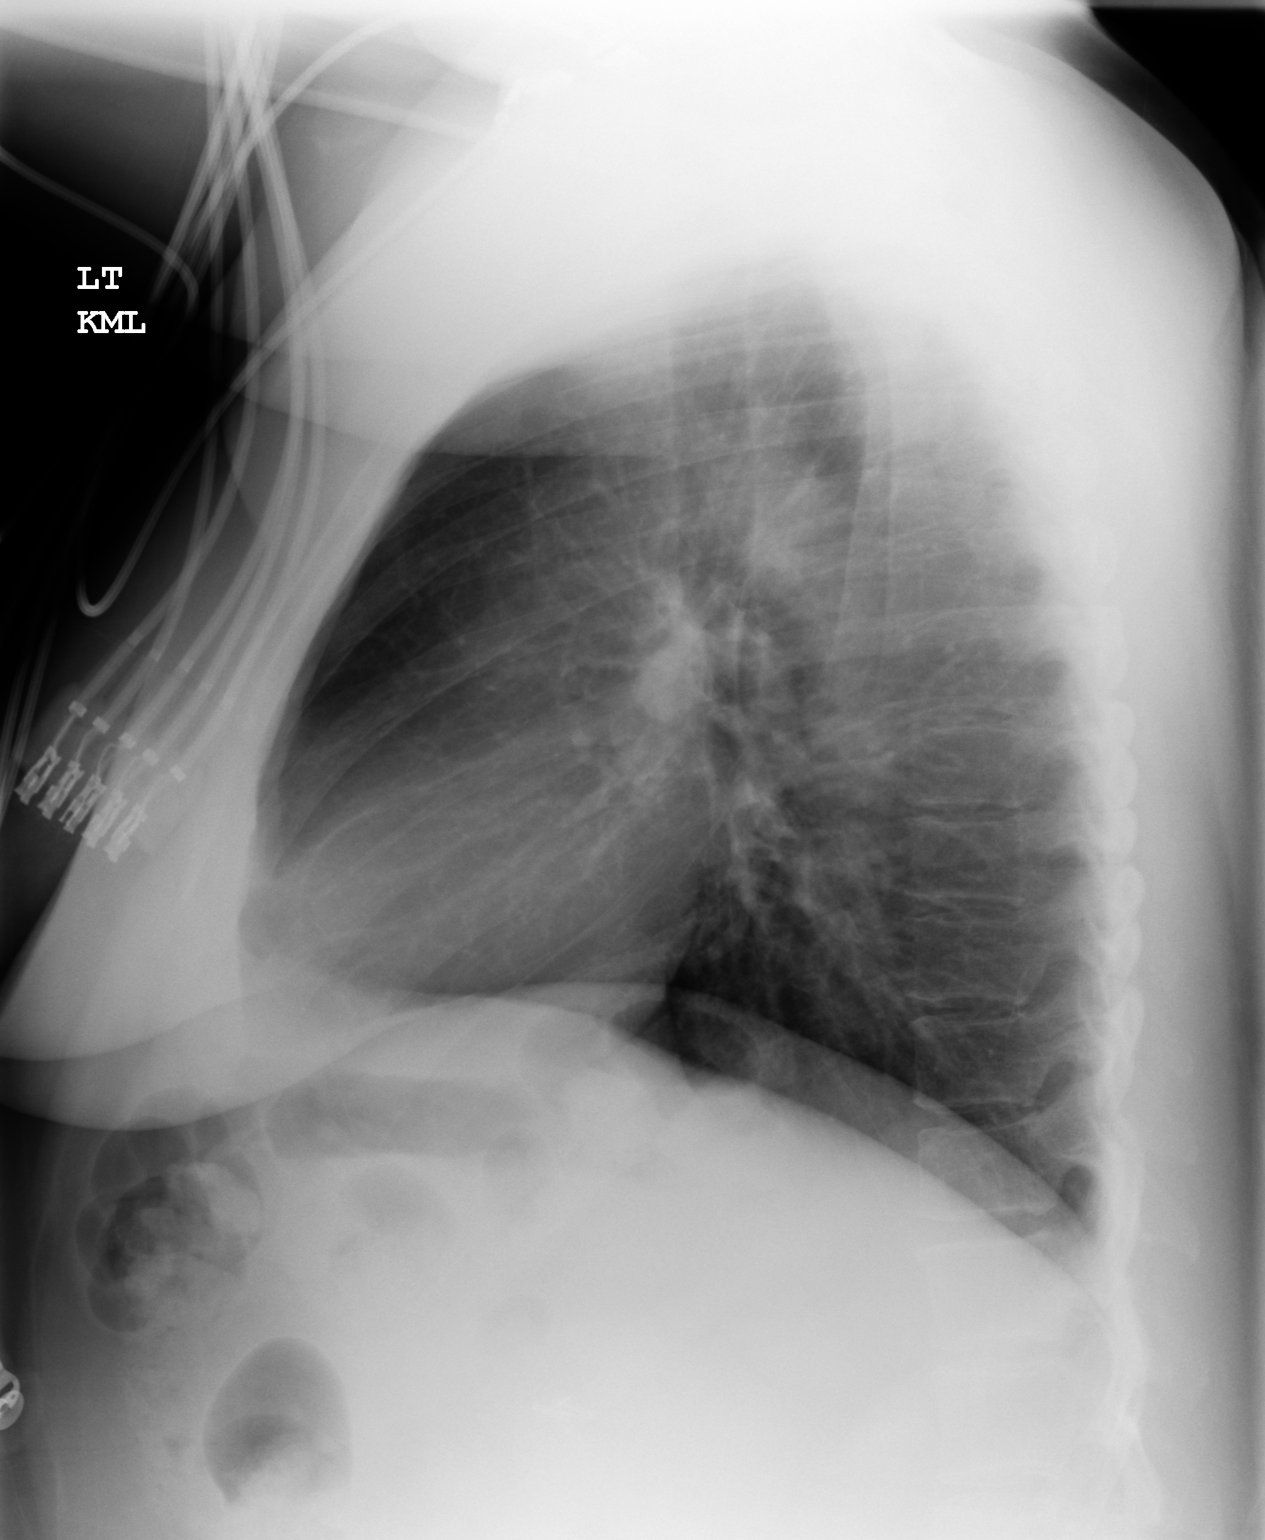

[2 of 2 positions shown; findings below may reference images not displayed]

IMPRESSION: I do not see evidence of pneumonia.  There is hyperinflation consistent with
reactive airway disease.

## 2007-09-15 IMAGING — CT CT CHEST W/ CM
1 series · 16 of 33 positions shown, 20 images · IV contrast (APPLIED)
Comparison: none

REASON FOR EXAM: Shortness of breath//RM10
COMMENTS:

[Series 4: soft tissue · axial · 0.74mm/px · z∈[-132,+116]mm · 16 of 91 slices shown, 20 images]
[im 4/91  mediastinal]
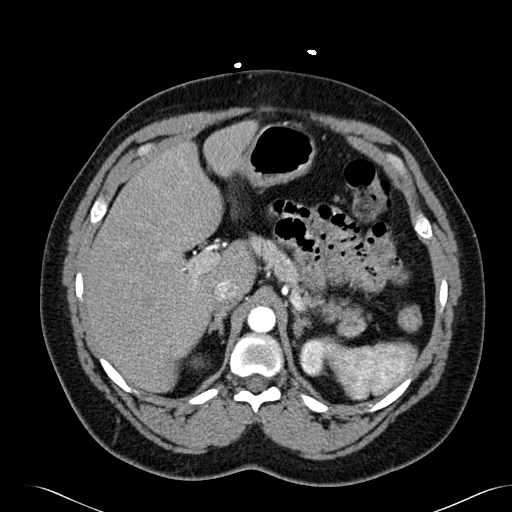
[im 4/91  lung]
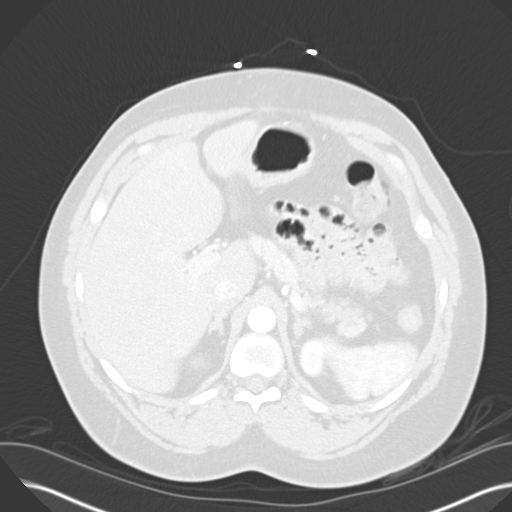
[im 11/91  lung]
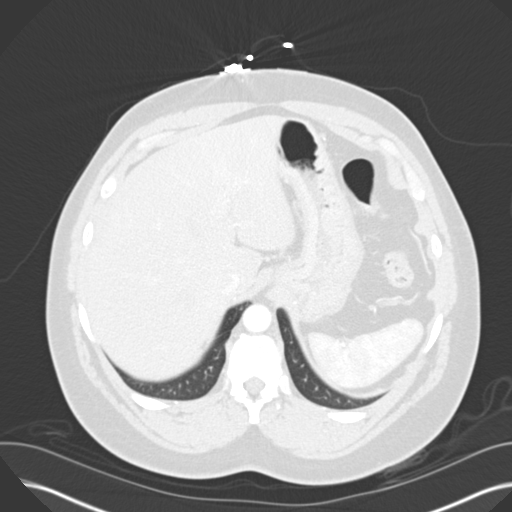
[im 17/91  lung]
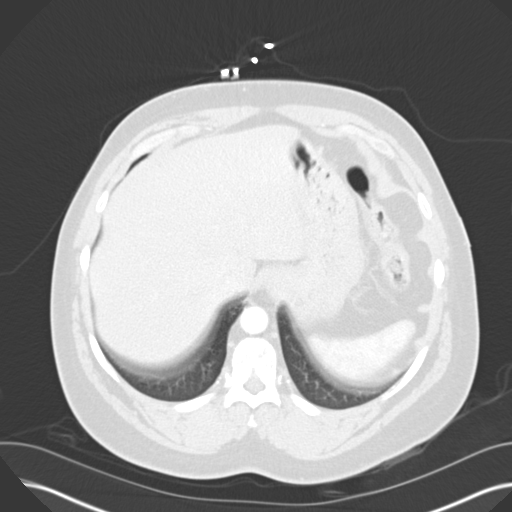
[im 21/91  lung]
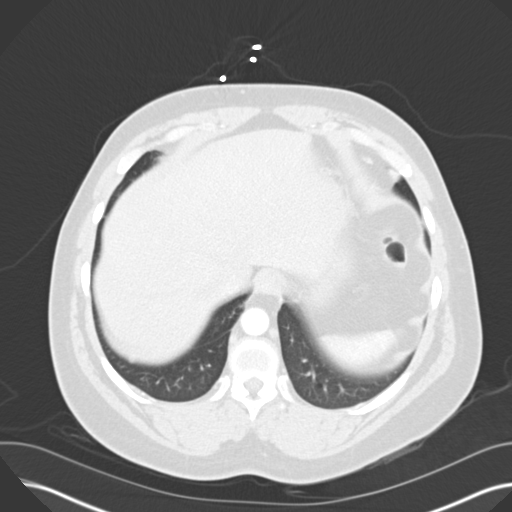
[im 27/91  mediastinal]
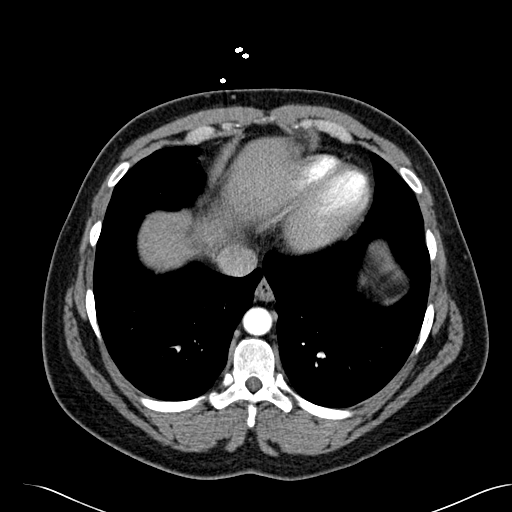
[im 27/91  lung]
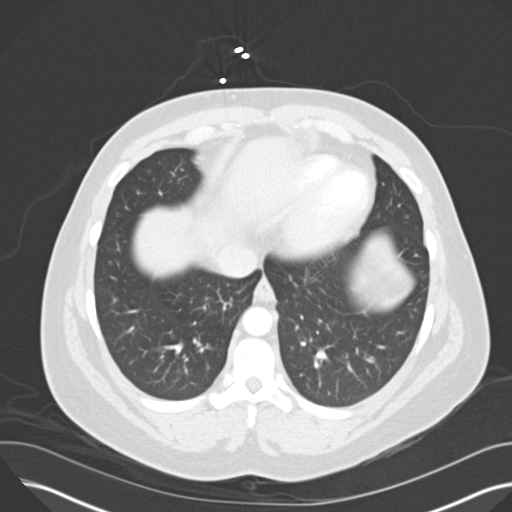
[im 34/91  lung]
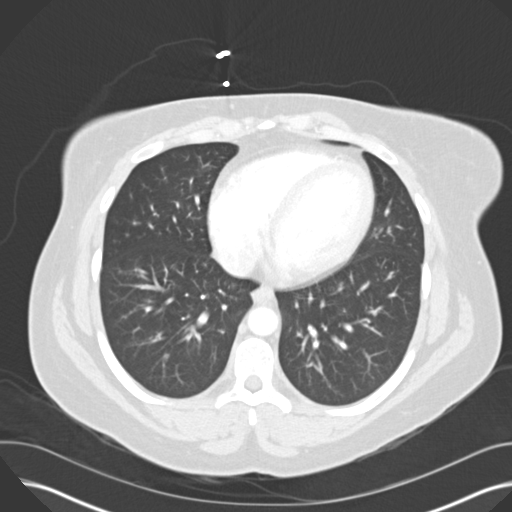
[im 41/91  lung]
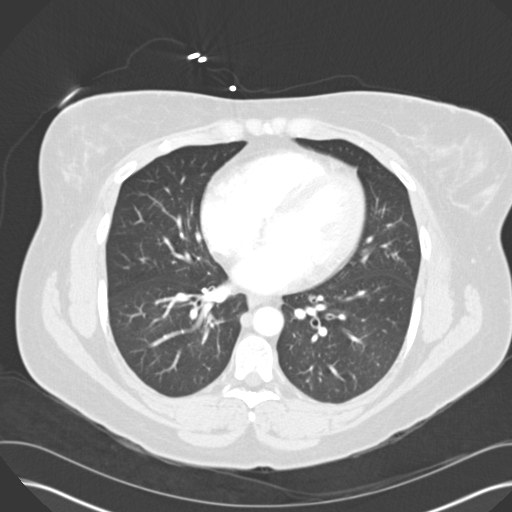
[im 44/91  lung]
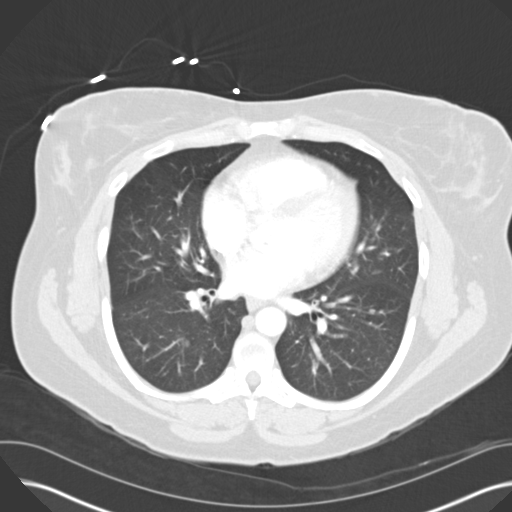
[im 49/91  mediastinal]
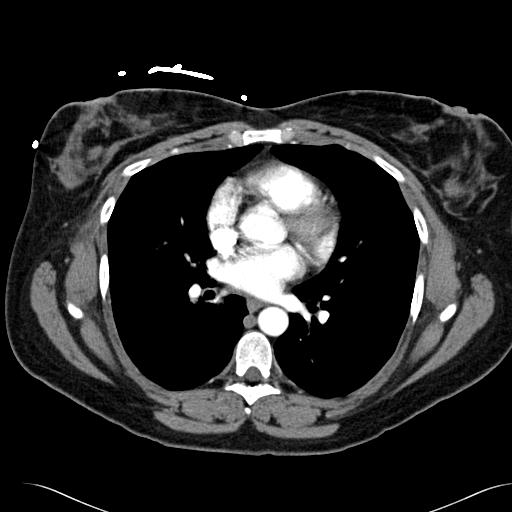
[im 49/91  lung]
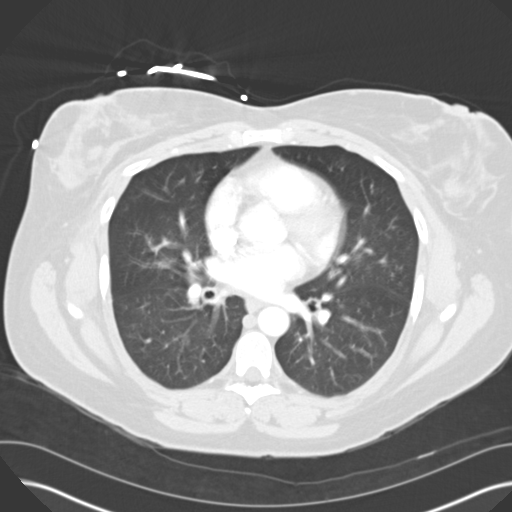
[im 54/91  lung]
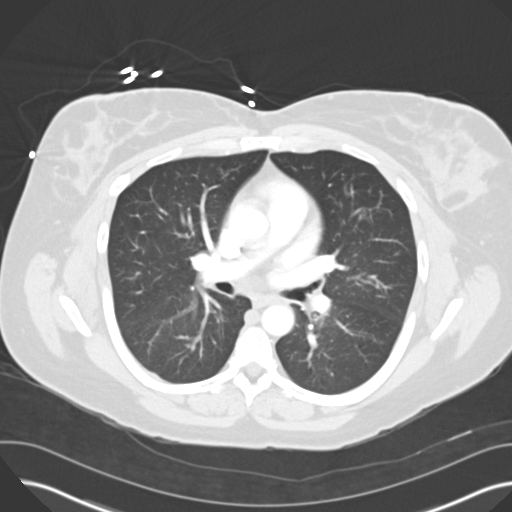
[im 57/91  lung]
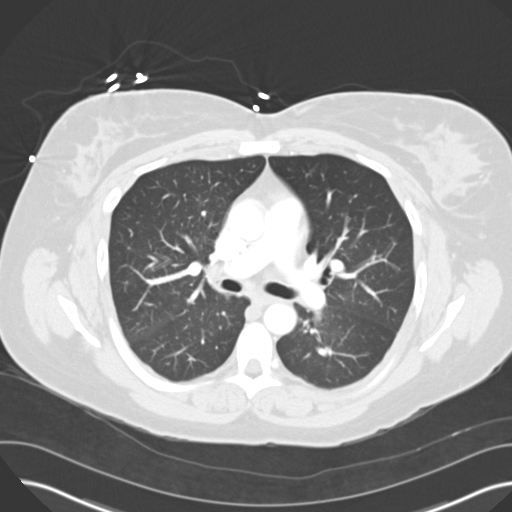
[im 64/91  lung]
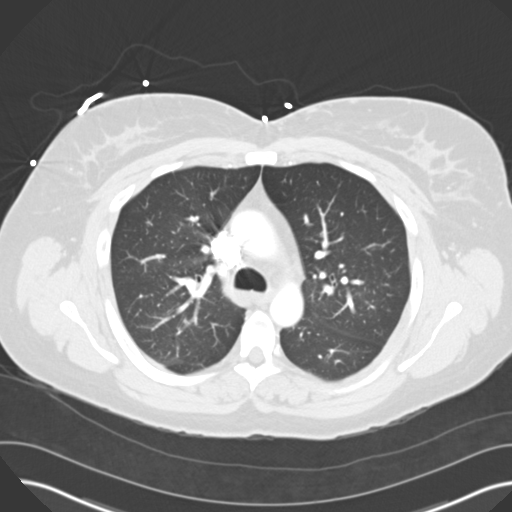
[im 71/91  mediastinal]
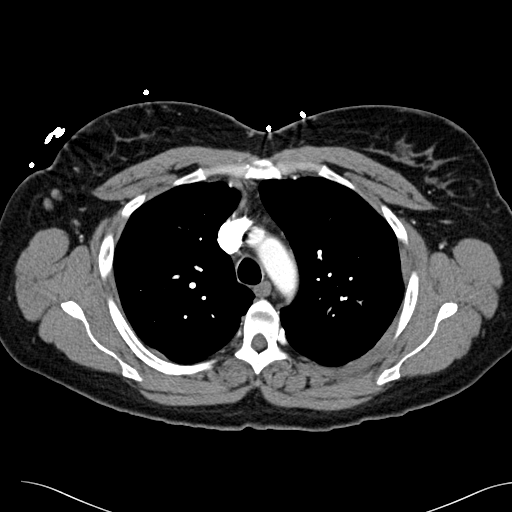
[im 71/91  lung]
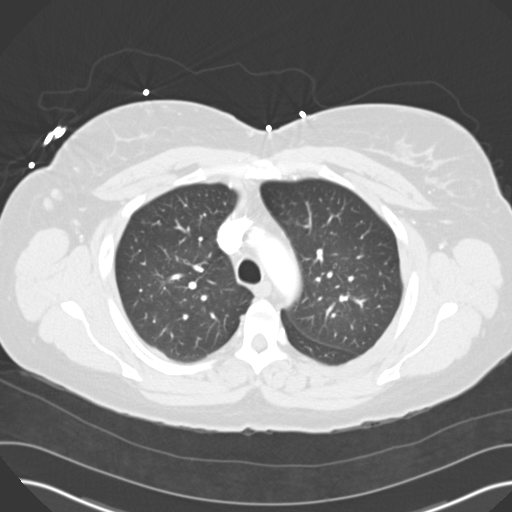
[im 74/91  lung]
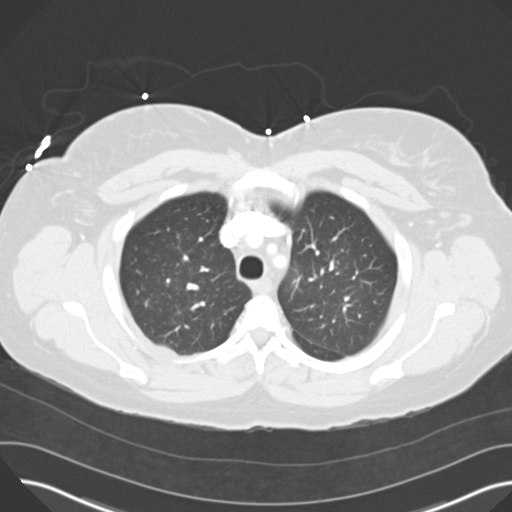
[im 81/91  lung]
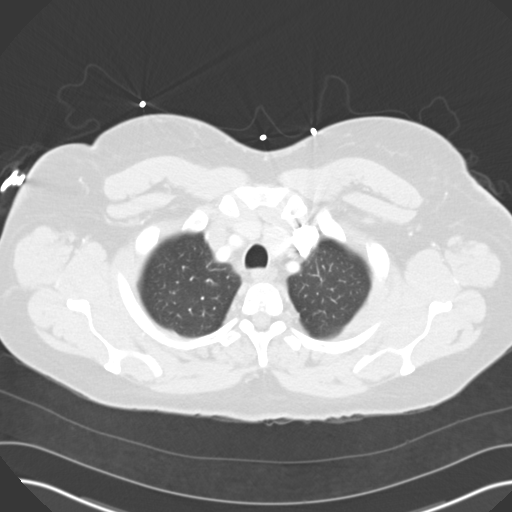
[im 87/91  lung]
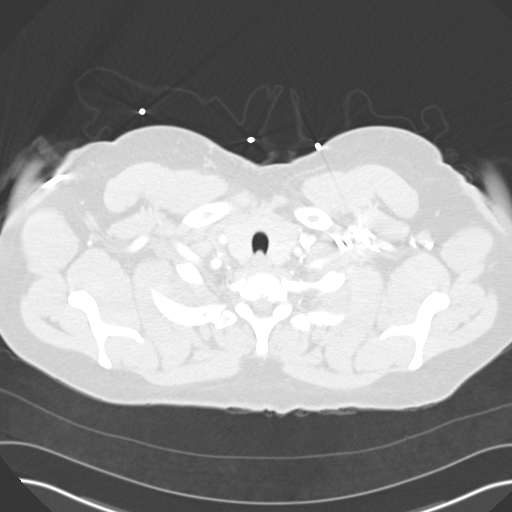

[16 of 33 positions shown; findings below may reference images not displayed]

PROCEDURE:     CT  - CT CHEST (FOR PE) W  - May 16, 2006  [DATE]

RESULT:       Spiral 3 mm sections were obtained from the thoracic inlet
through the lung bases status post intravenous administration of 100 ml of
[id number].

Evaluation of the mediastinum, hilar regions and structures demonstrate no
evidence of mediastinal nor hilar adenopathy or masses.  There is no
evidence of filling defects within the main, lobar or segmental pulmonary
arteries to suggest a pulmonary embolus.  The lung parenchyma demonstrate no
evidence of focal infiltrates, effusions or edema.  The visualized upper
abdominal viscera demonstrate no gross abnormalities.
IMPRESSION: 1.     No evidence of pulmonary embolic disease as described above and
otherwise unremarkable CT of the chest.
2.     Dr. Stoll of the Emergency Department was informed of these findings
at the time of the initial interpretation.

## 2007-09-29 IMAGING — CR DG CHEST 1V PORT
1 series · 1 of 1 positions shown · non-contrast
Comparison: none

REASON FOR EXAM: Difficulty breathing
COMMENTS:

[view not recorded]
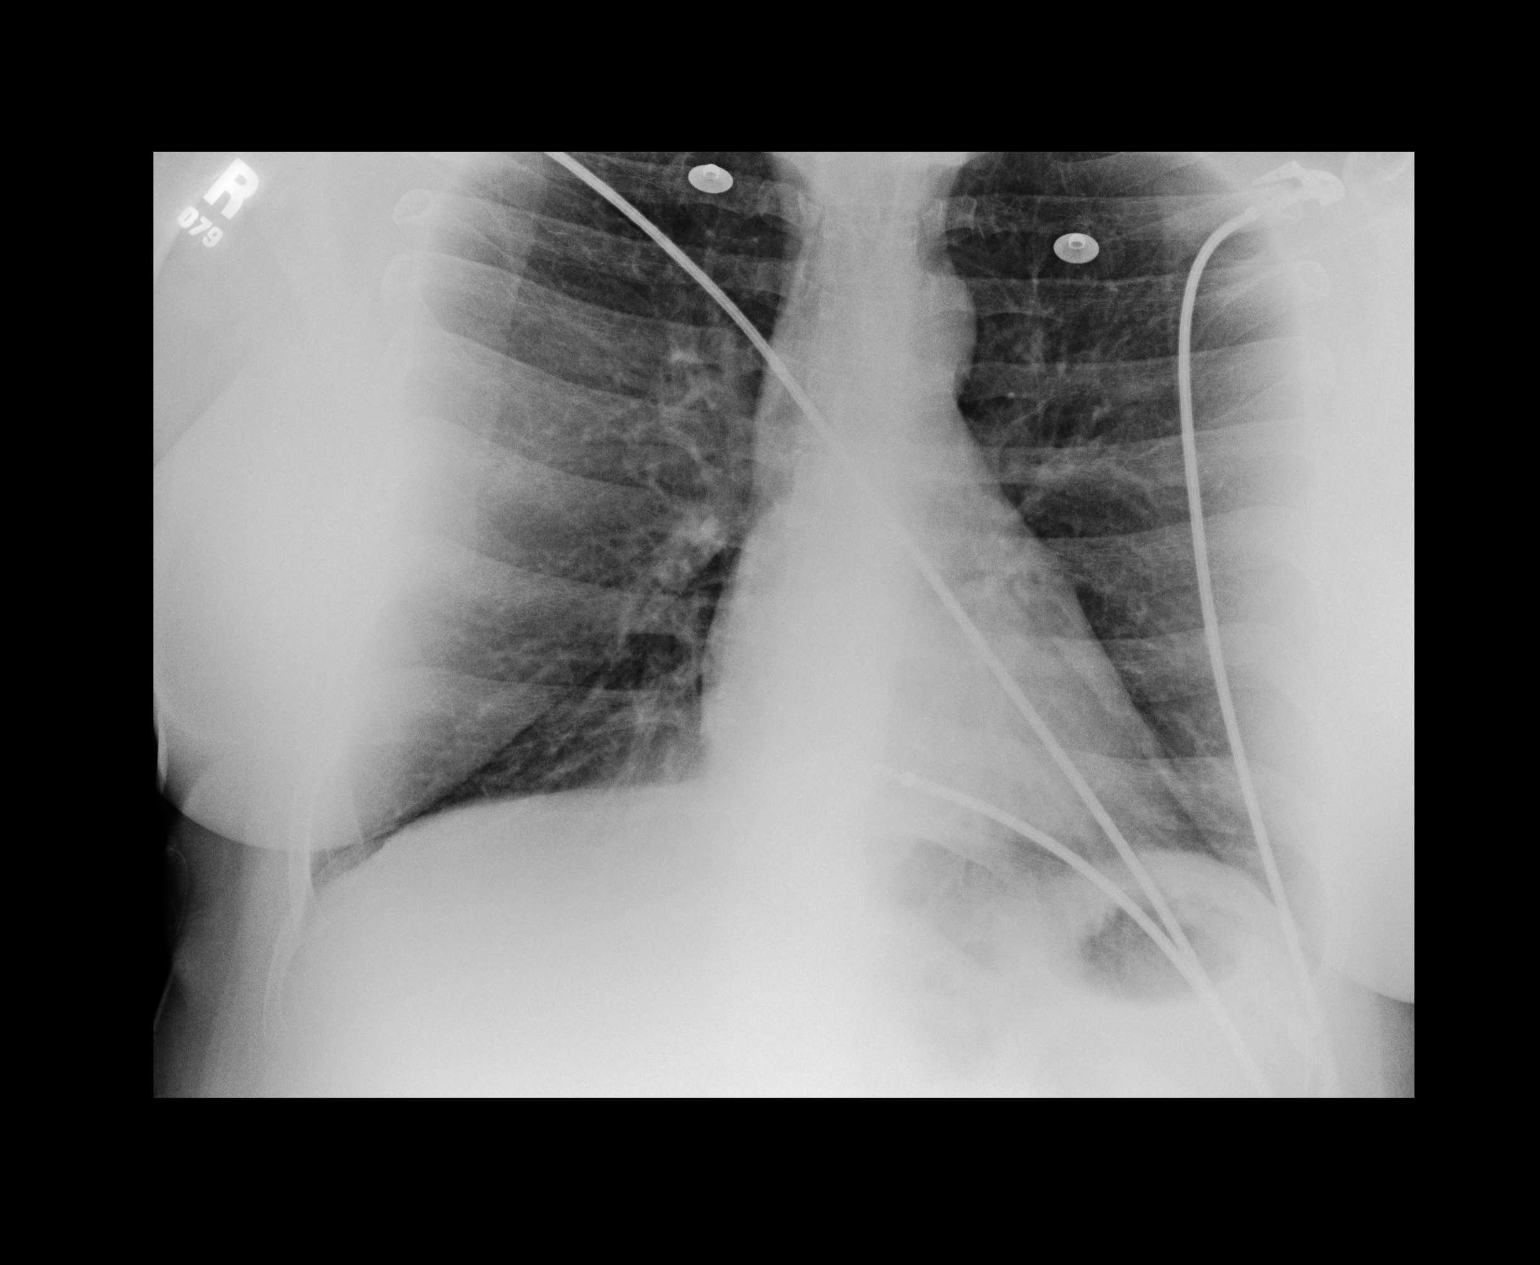

[1 of 1 positions shown; findings below may reference images not displayed]

PROCEDURE:     DXR - DXR PORTABLE CHEST SINGLE VIEW  - May 30, 2006  [DATE]

RESULT:     AP view of the chest is compared to the prior exam of
05/16/2006.

The lung fields are clear. No pneumonia, pneumothorax or pleural effusion is
seen. The chest appears hyperexpanded suspicious for a history of asthma or
COPD.
IMPRESSION: 1.     The lung fields are clear.
2.     The chest appears hyperexpanded.

## 2007-10-01 ENCOUNTER — Emergency Department: Payer: Self-pay | Admitting: Emergency Medicine

## 2007-10-02 ENCOUNTER — Inpatient Hospital Stay: Payer: Self-pay | Admitting: Internal Medicine

## 2007-10-02 ENCOUNTER — Other Ambulatory Visit: Payer: Self-pay

## 2007-10-16 IMAGING — CR DG CHEST 2V
1 series · 2 of 2 positions shown · non-contrast
Comparison: none

REASON FOR EXAM: diff breathing  rm 18
COMMENTS:

PROCEDURE:     DXR - DXR CHEST PA (OR AP) AND LATERAL  - June 16, 2006  [DATE]
RESULT:     The lung fields are clear. The heart, mediastinal and osseous
structures show no significant abnormalities.

[Series 1: view not recorded · 0.17mm/px · 2 of 2 slices shown]
[im 1/2]
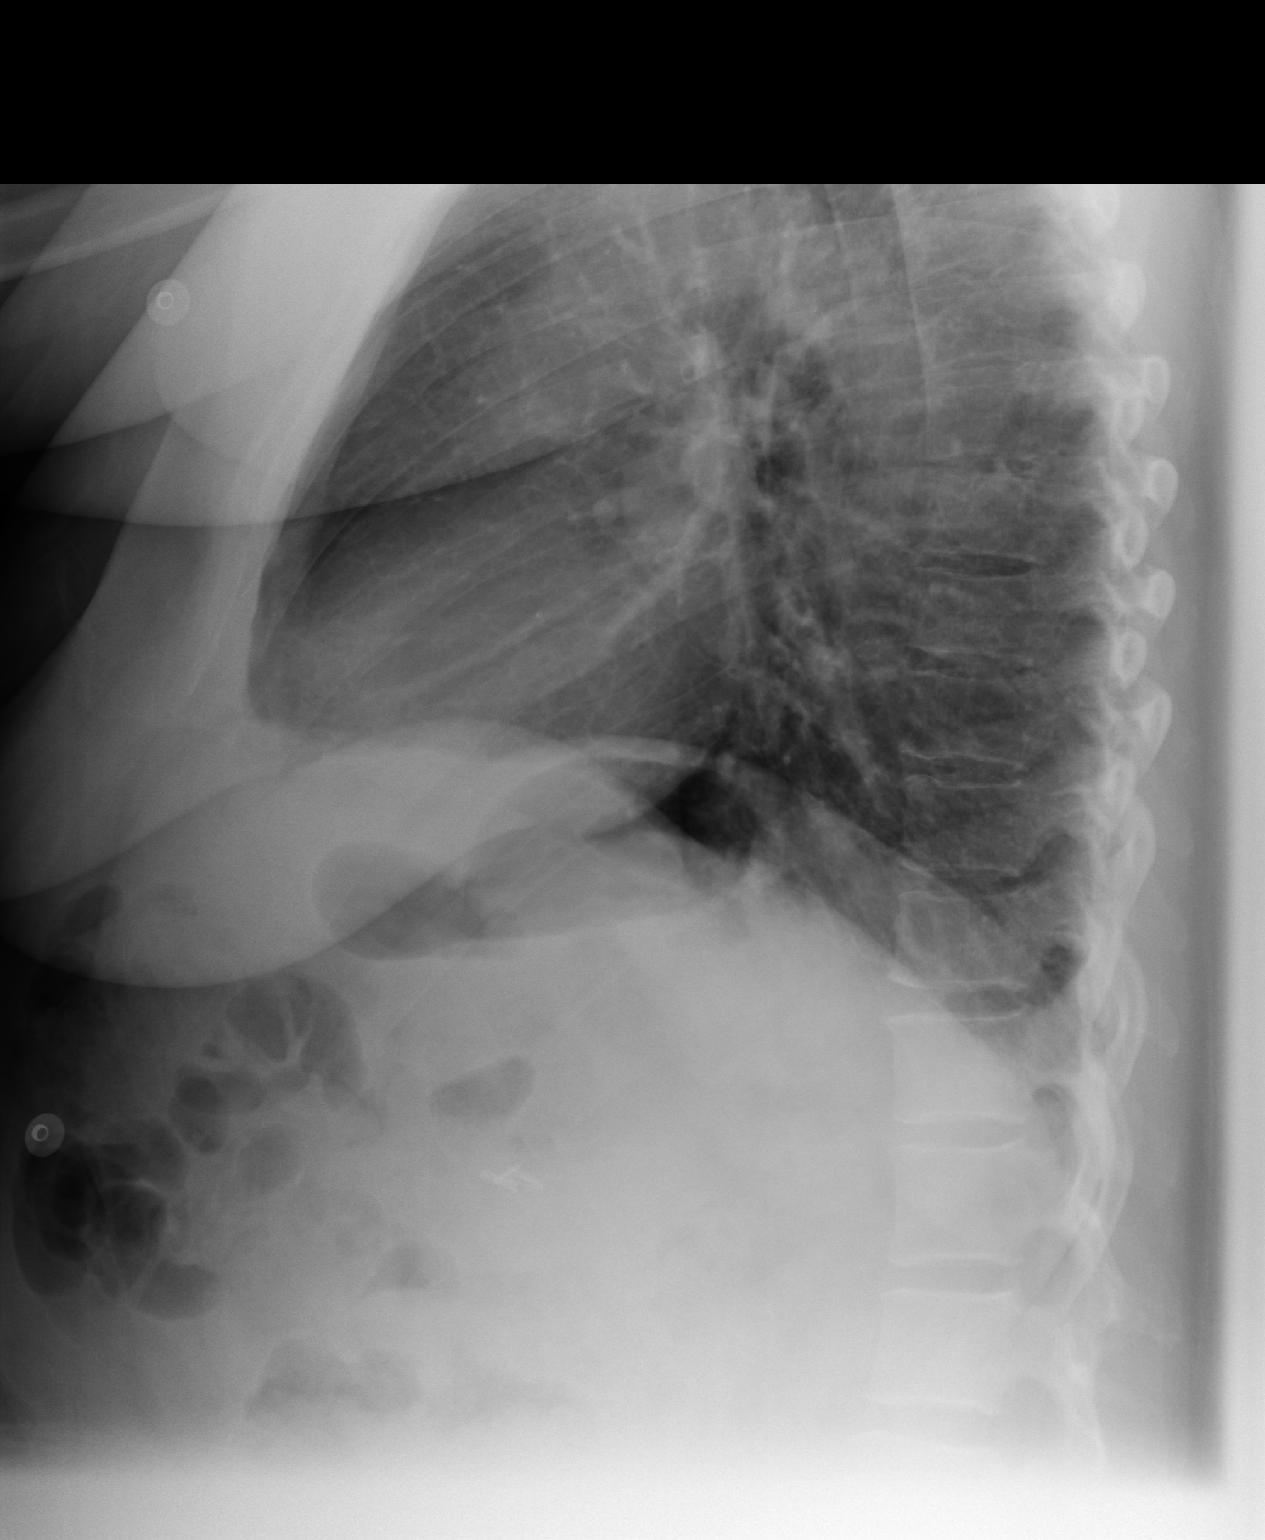
[im 2/2]
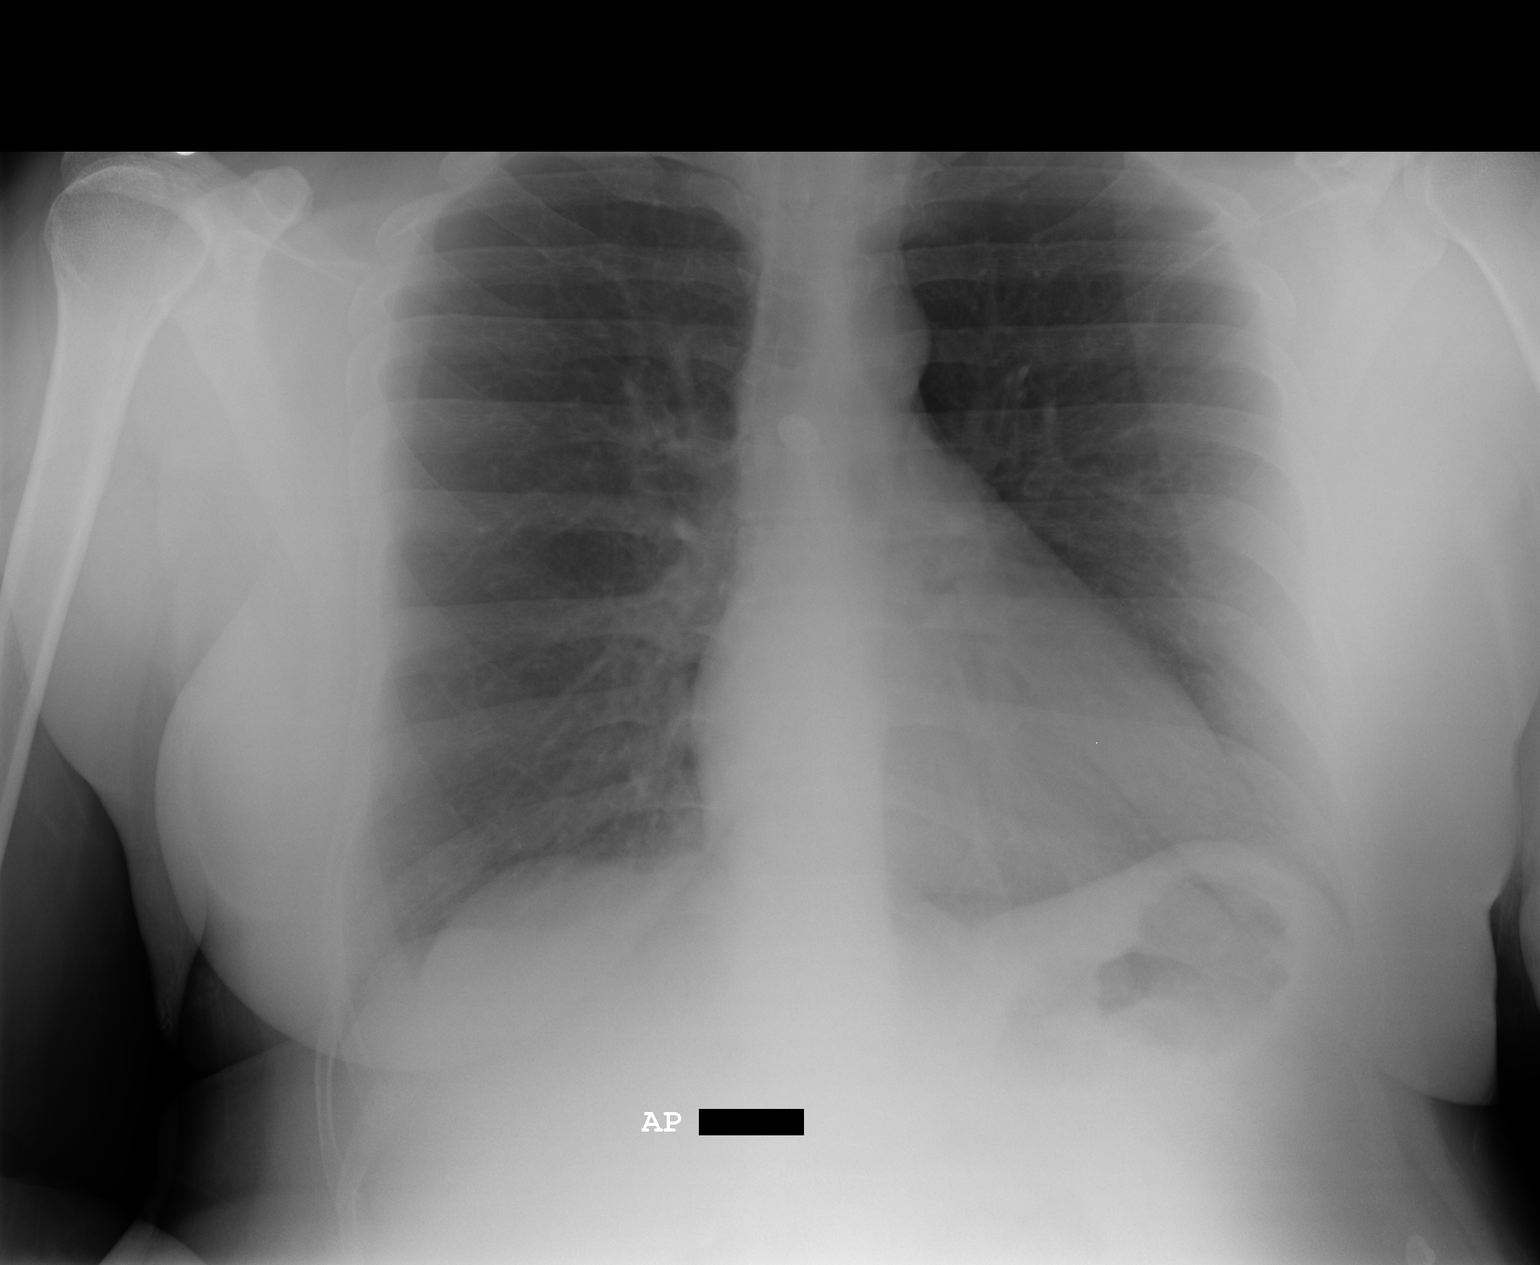

[2 of 2 positions shown; findings below may reference images not displayed]

IMPRESSION: 1)No significant abnormalities are noted and no significant interval change
is seen as compared to the prior exam of 05-30-06.

## 2008-01-12 ENCOUNTER — Emergency Department: Payer: Self-pay | Admitting: Emergency Medicine

## 2008-01-24 IMAGING — CR DG CHEST 1V PORT
1 series · 1 of 1 positions shown · non-contrast
Comparison: none

REASON FOR EXAM: SHORTNESS OF BREATH
COMMENTS:

PROCEDURE:     DXR - DXR PORTABLE CHEST SINGLE VIEW  - September 24, 2006  [DATE]
RESULT:          This study was compared to a previous study dated
05/30/2006.
The lungs are clear.  The cardiac silhouette and visualized bony skeleton
are unremarkable.

[view not recorded]
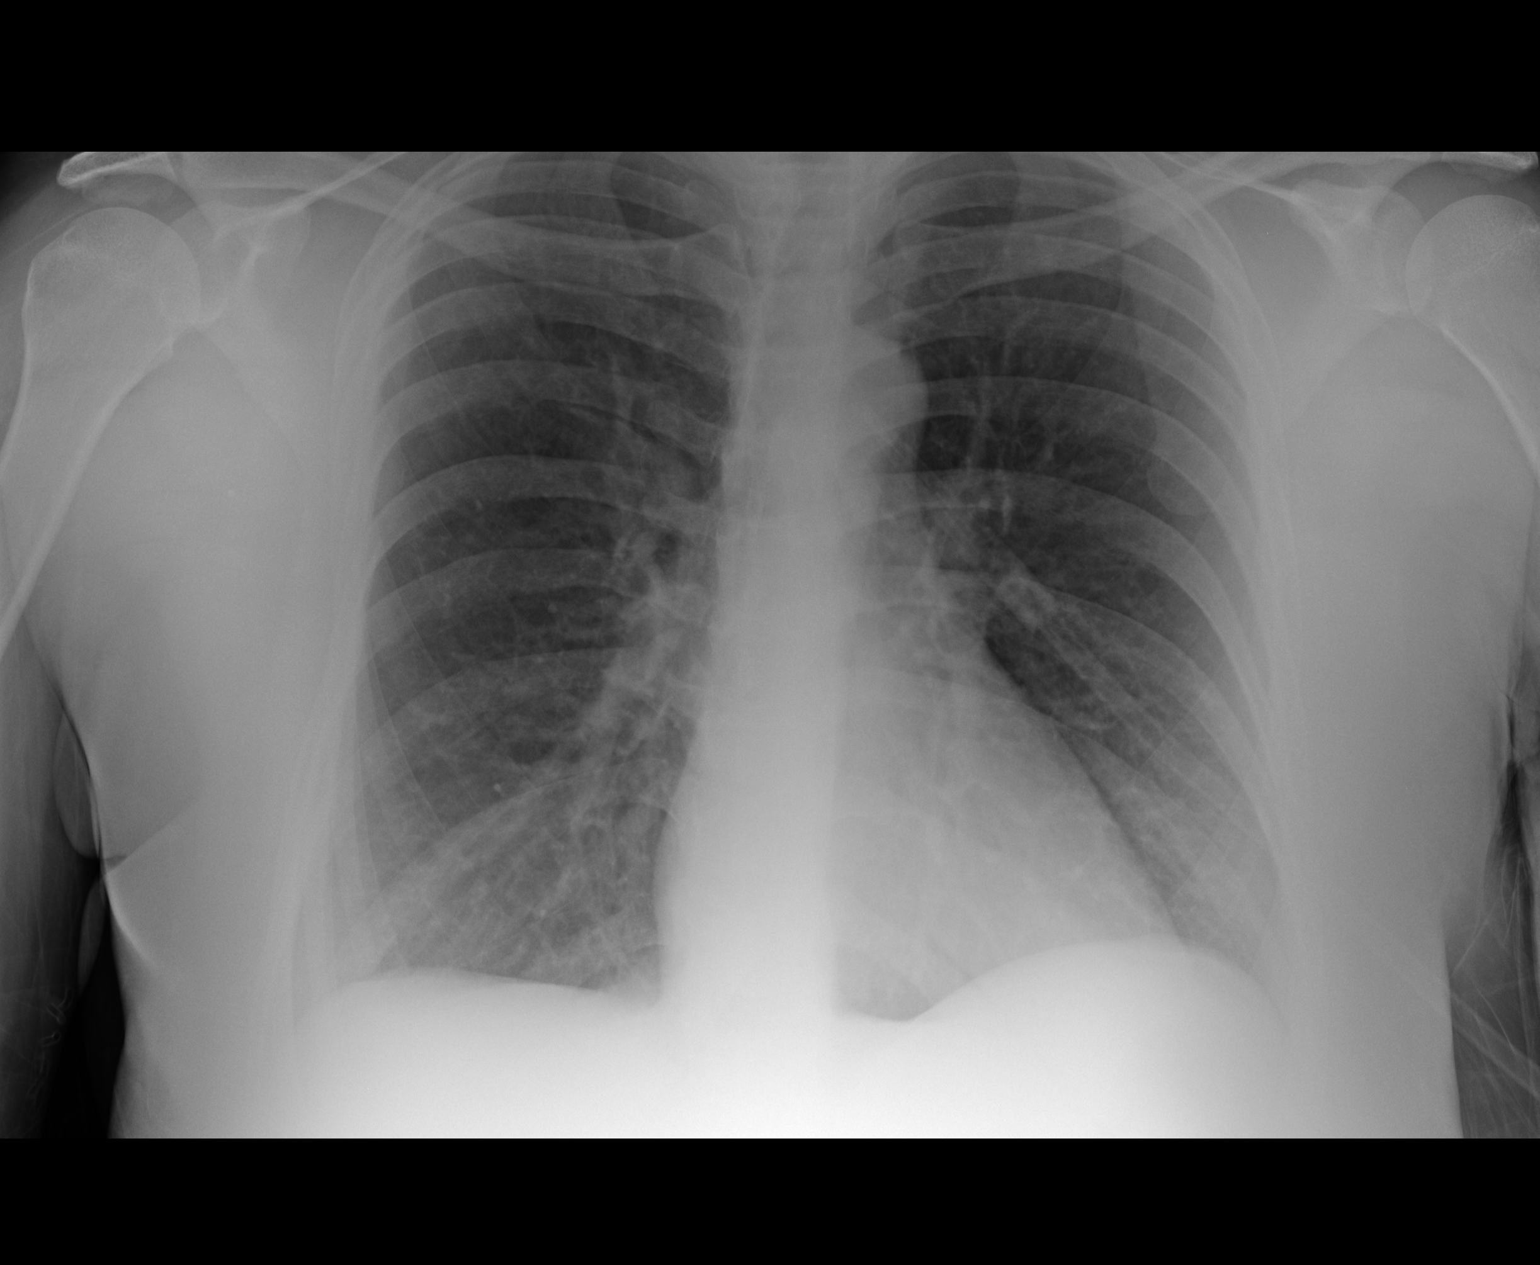

[1 of 1 positions shown; findings below may reference images not displayed]

IMPRESSION: Chest radiograph without evidence of acute
cardiopulmonary disease.

## 2008-02-03 IMAGING — CR DG CHEST 1V PORT
1 series · 1 of 1 positions shown · non-contrast
Comparison: none

REASON FOR EXAM: Shortness of Breath
COMMENTS:

PROCEDURE:     DXR - DXR PORTABLE CHEST SINGLE VIEW  - October 04, 2006 [DATE]
RESULT:     AP view of the chest shows the lung fields to be clear. The
heart, mediastinal and osseous structures show no significant abnormalities.

[view not recorded]
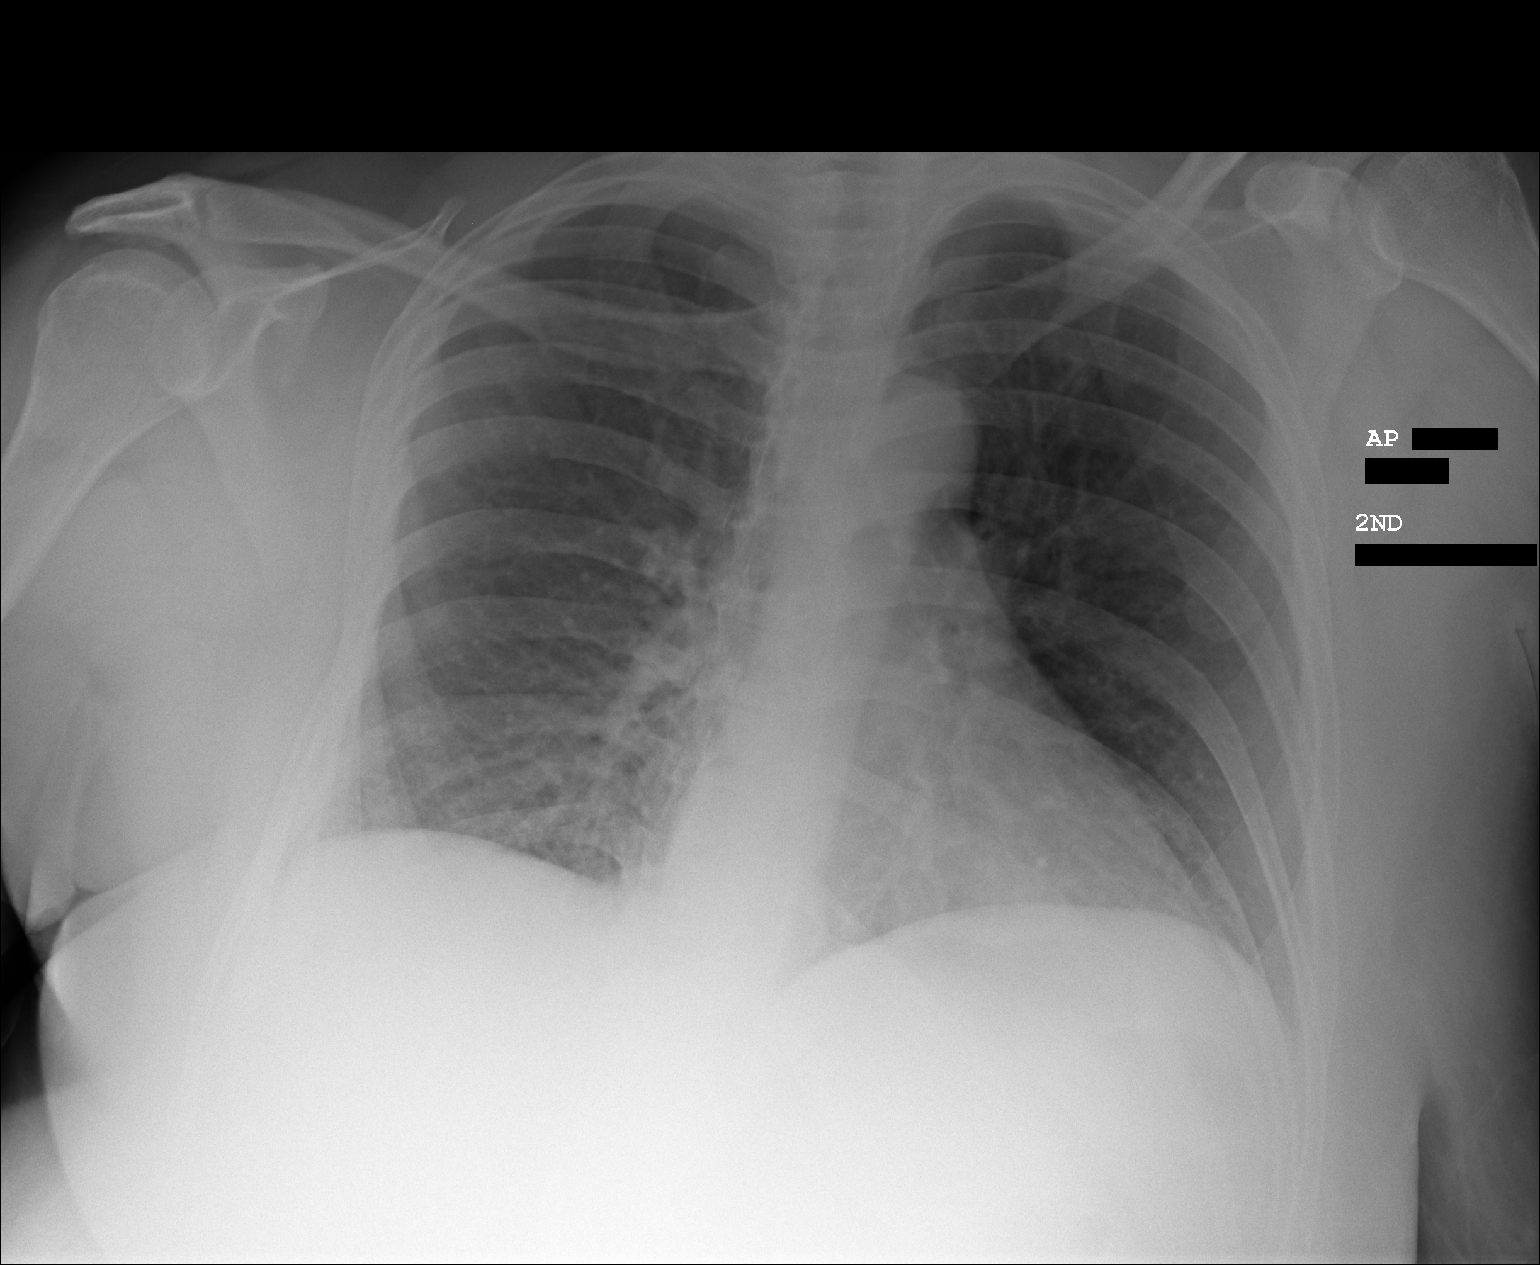

[1 of 1 positions shown; findings below may reference images not displayed]

IMPRESSION: 1)No significant abnormalities are noted.

## 2008-02-21 IMAGING — CR DG CHEST 2V
1 series · 2 of 2 positions shown · non-contrast
Comparison: none

REASON FOR EXAM: shortness of breath
COMMENTS:

PROCEDURE:     DXR - DXR CHEST PA (OR AP) AND LATERAL  - October 22, 2006  [DATE]
RESULT:          Two views of the chest show the cardiac silhouette and
pulmonary vasculature appear to be normal.  The lungs are clear.  The bony
and mediastinal structures are unremarkable.

[Series 1: view not recorded · 0.17mm/px · 2 of 2 slices shown]
[im 1/2]
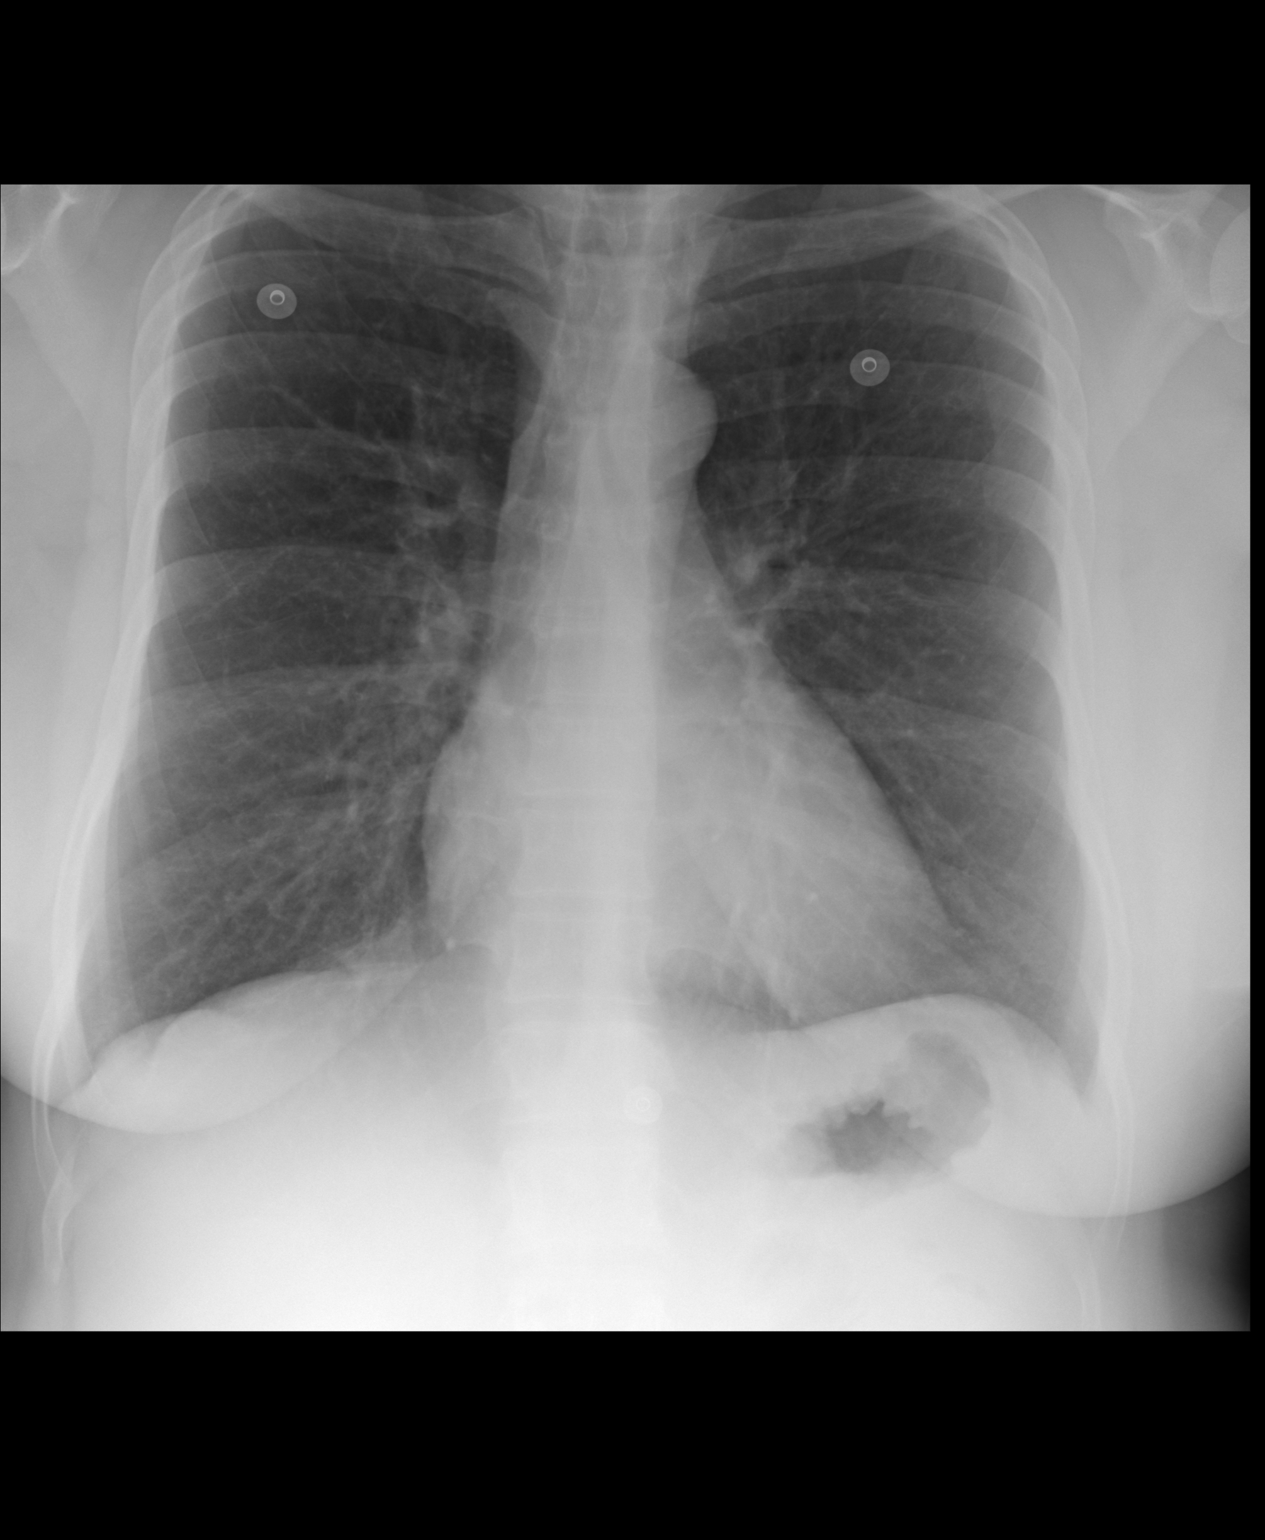
[im 2/2]
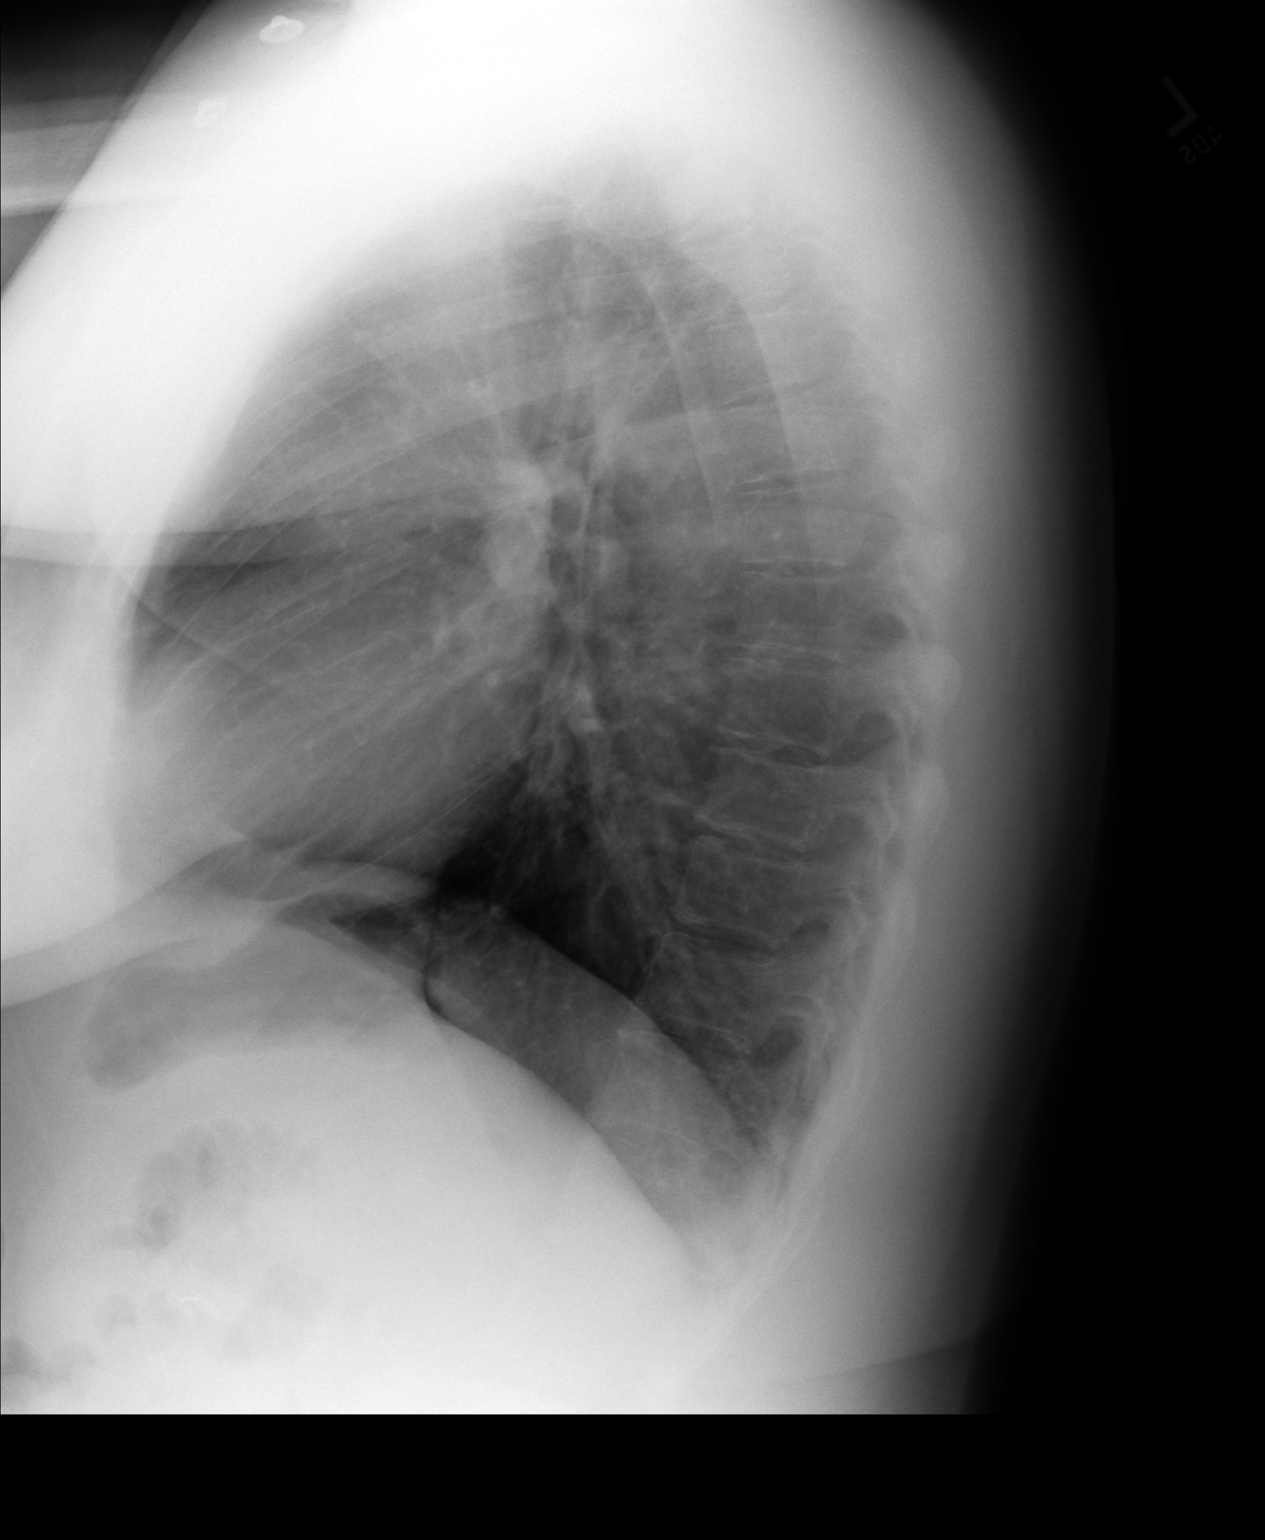

[2 of 2 positions shown; findings below may reference images not displayed]

IMPRESSION: No acute cardiopulmonary disease.

## 2008-03-10 IMAGING — CR DG CHEST 1V PORT
1 series · 1 of 1 positions shown · non-contrast
Comparison: none

REASON FOR EXAM: Asthma
COMMENTS:

PROCEDURE:     DXR - DXR PORTABLE CHEST SINGLE VIEW  - November 09, 2006  [DATE]
RESULT:     The lung fields are clear. No pneumonia, pneumothorax or pleural
effusion is seen. The heart size is normal.

[view not recorded]
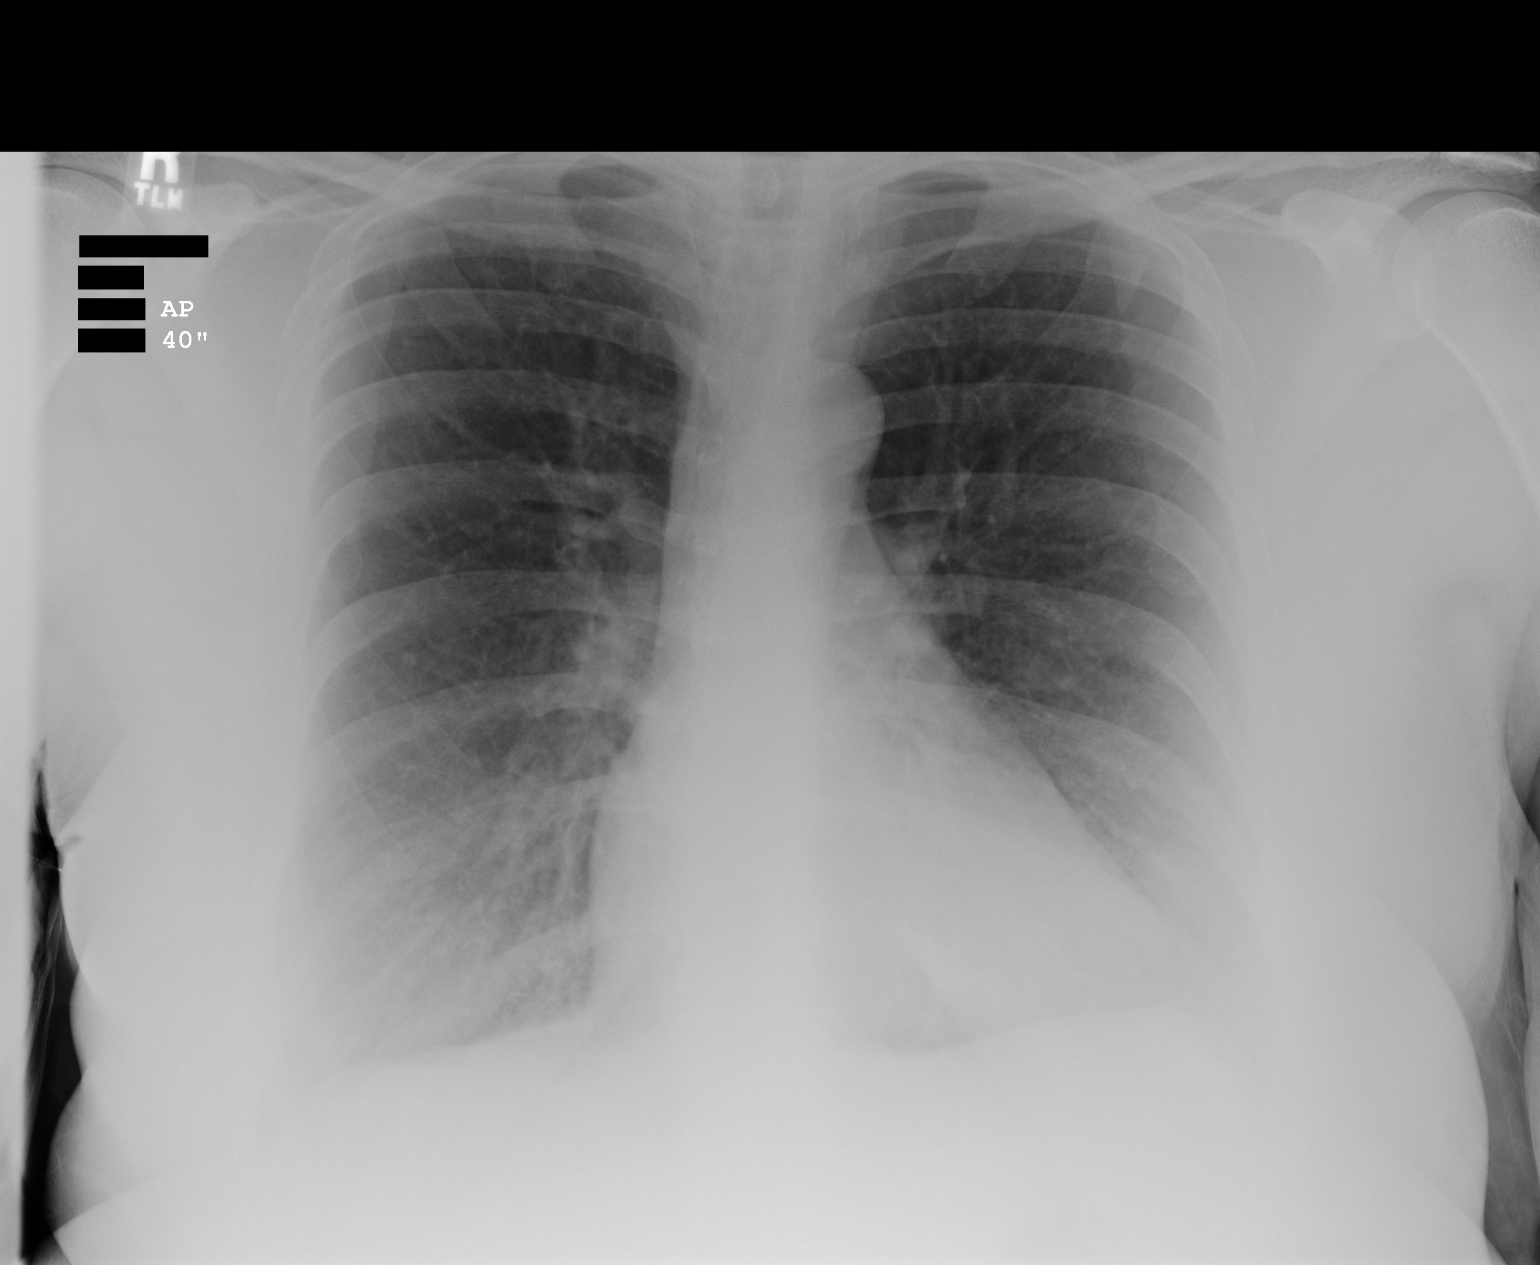

[1 of 1 positions shown; findings below may reference images not displayed]

IMPRESSION: 1.     No acute changes are identified.
2.     The chest appears mildly hyperexpanded which would be consistent with
a clinical history of asthma.
3.     The heart size is normal.

## 2008-04-13 ENCOUNTER — Emergency Department: Payer: Self-pay | Admitting: Internal Medicine

## 2008-04-15 ENCOUNTER — Emergency Department: Payer: Self-pay | Admitting: Emergency Medicine

## 2008-04-18 IMAGING — CR DG CHEST 2V
1 series · 2 of 2 positions shown · non-contrast
Comparison: none

REASON FOR EXAM: Shortness of breath
COMMENTS:

[Series 1: view not recorded · 0.17mm/px · 2 of 2 slices shown]
[im 1/2]
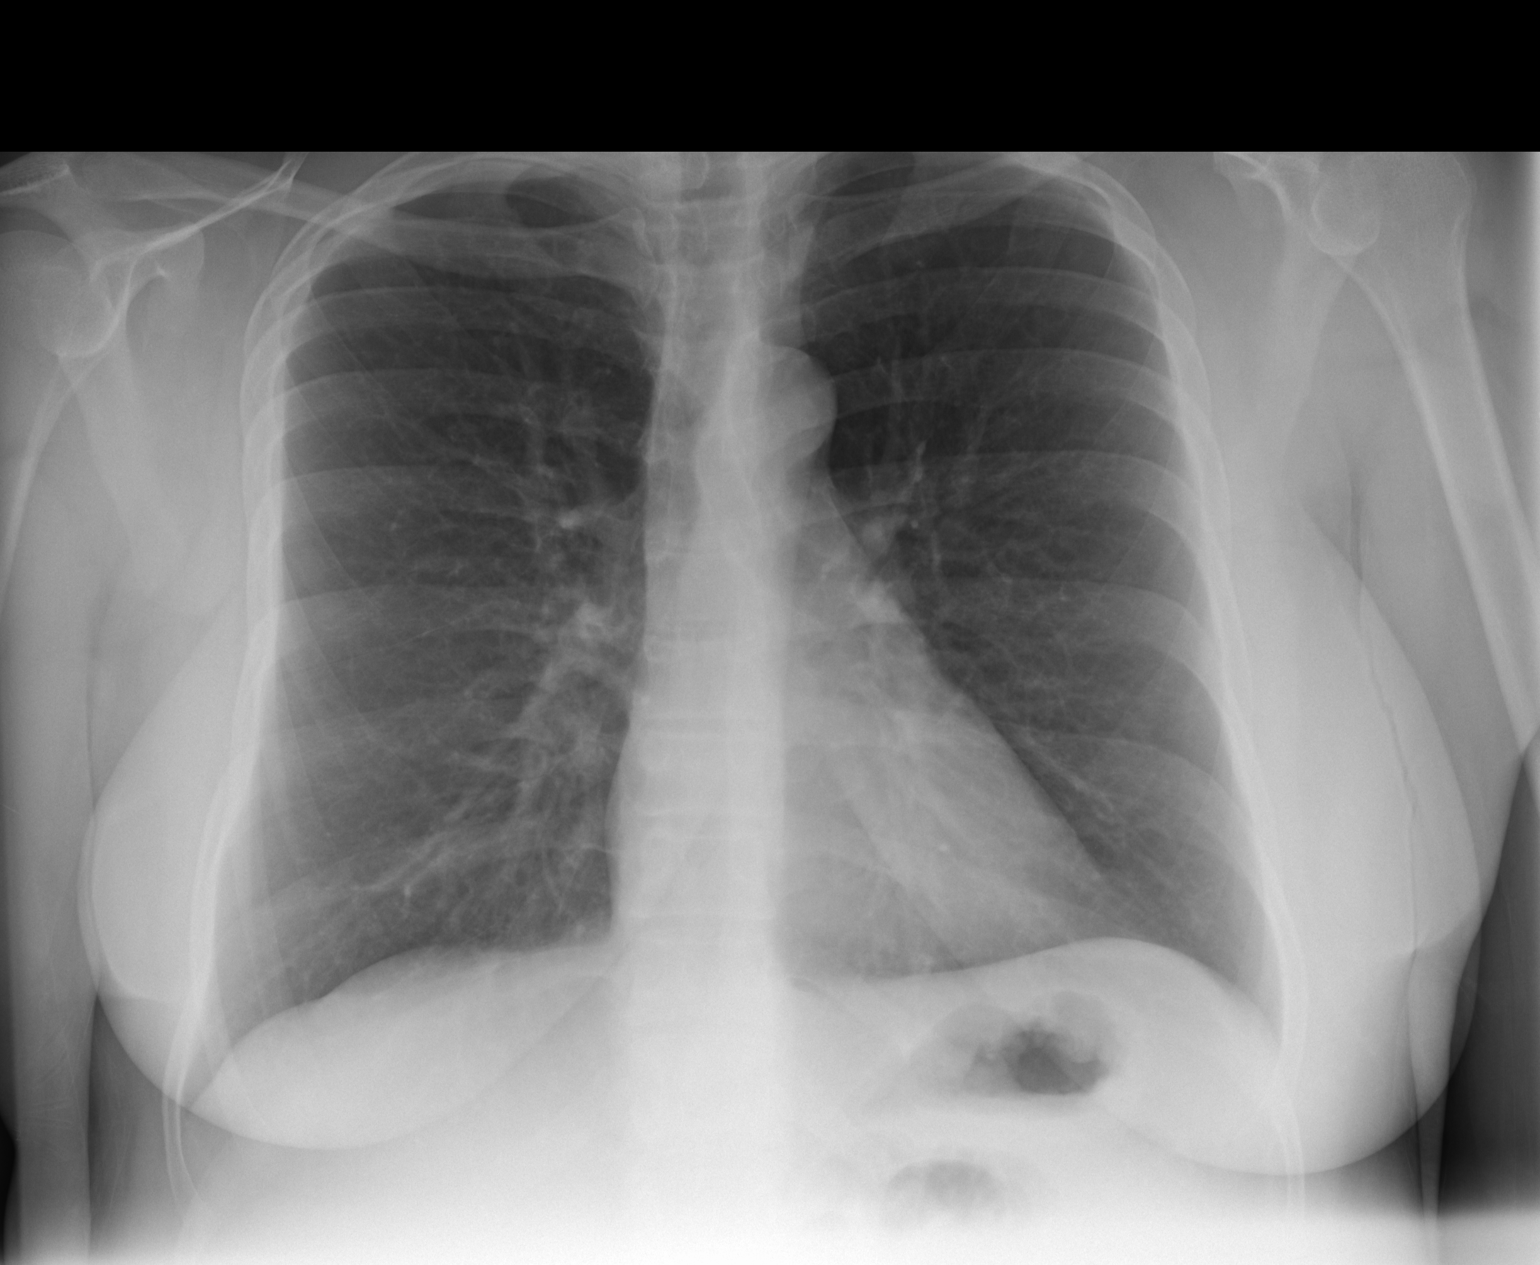
[im 2/2]
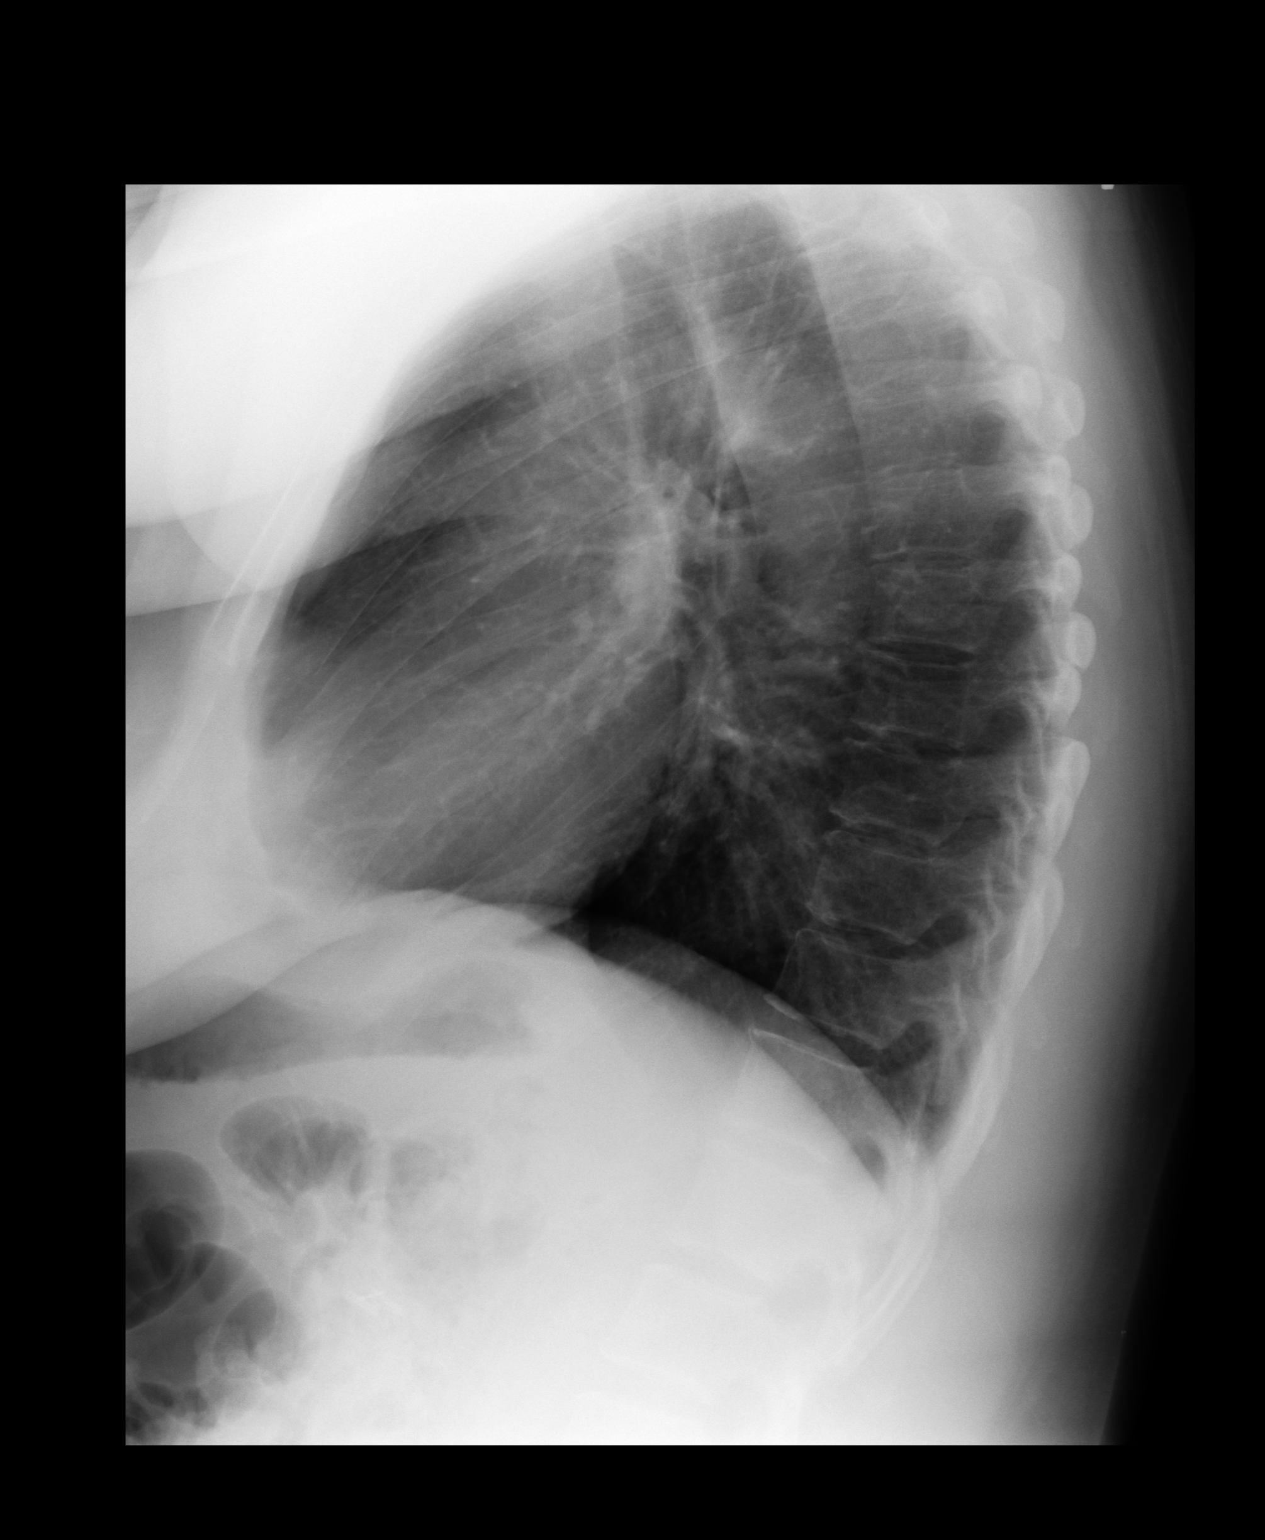

[2 of 2 positions shown; findings below may reference images not displayed]

PROCEDURE:     DXR - DXR CHEST PA (OR AP) AND LATERAL  - December 18, 2006  [DATE]

RESULT:     The lung fields are clear. No pneumonia, pneumothorax or pleural
effusion is seen. The heart, mediastinal and osseous structures show no
significant abnormalities. There is no significant interval change compared
to prior exam of 11/09/2006.
IMPRESSION: No significant abnormality is noted.

## 2008-04-21 ENCOUNTER — Emergency Department: Payer: Self-pay | Admitting: Emergency Medicine

## 2008-05-14 ENCOUNTER — Emergency Department: Payer: Self-pay | Admitting: Emergency Medicine

## 2008-06-03 ENCOUNTER — Emergency Department: Payer: Self-pay | Admitting: Emergency Medicine

## 2008-06-18 IMAGING — CR DG CHEST 1V PORT
1 series · 1 of 1 positions shown · non-contrast
Comparison: none

REASON FOR EXAM: DIFF BREATHING/[HOSPITAL]
COMMENTS:

[view not recorded]
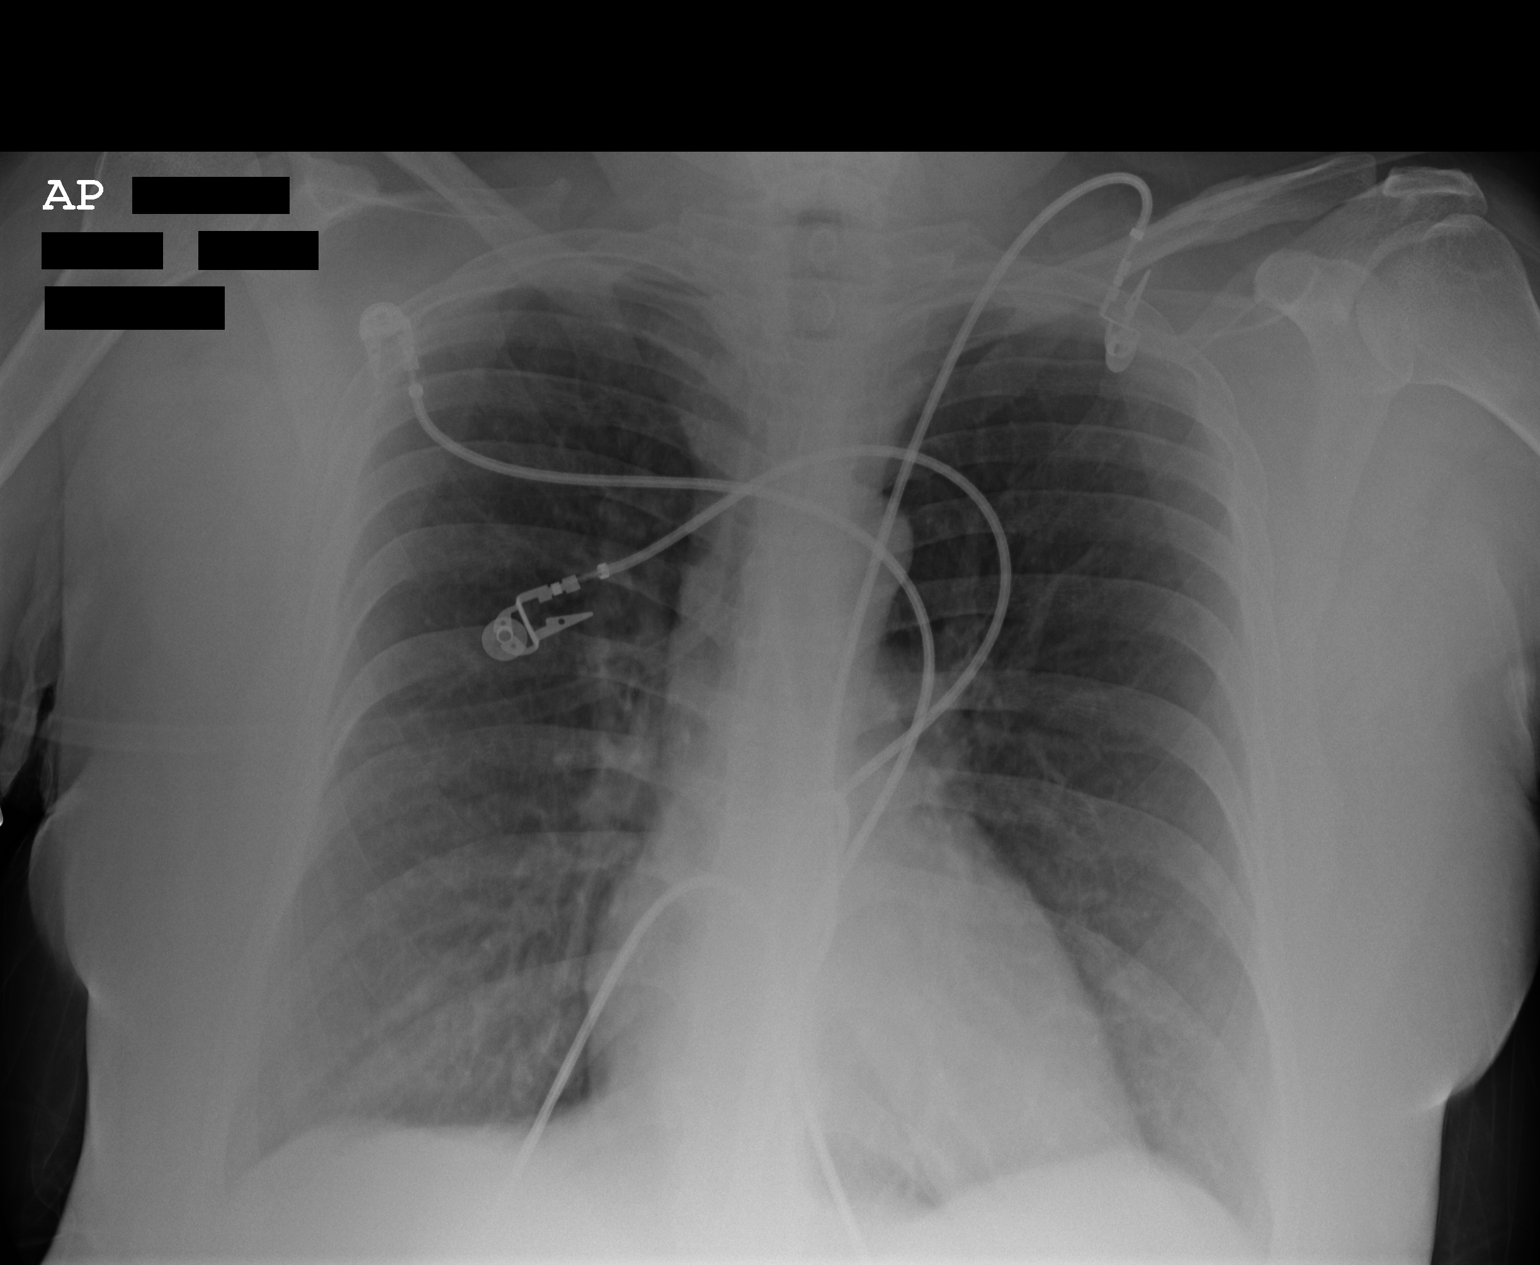

[1 of 1 positions shown; findings below may reference images not displayed]

PROCEDURE:     DXR - DXR PORTABLE CHEST SINGLE VIEW  - February 17, 2007  [DATE]

RESULT:     Comparison is made to the prior study dated 12/20/06.

The lungs are hyperinflated.  No focal regions of consolidation are
demonstrated.  The cardiac silhouette and visualized bony skeleton are
unremarkable.
IMPRESSION: Hyperinflation without evidence of acute cardiopulmonary
disease.

## 2008-07-04 ENCOUNTER — Other Ambulatory Visit: Payer: Self-pay

## 2008-07-04 ENCOUNTER — Emergency Department: Payer: Self-pay | Admitting: Emergency Medicine

## 2008-07-24 ENCOUNTER — Emergency Department: Payer: Self-pay | Admitting: Emergency Medicine

## 2008-08-05 ENCOUNTER — Emergency Department: Payer: Self-pay | Admitting: Emergency Medicine

## 2008-08-05 ENCOUNTER — Other Ambulatory Visit: Payer: Self-pay

## 2008-08-29 IMAGING — CR DG CHEST 1V PORT
1 series · 1 of 1 positions shown · non-contrast
Comparison: none

REASON FOR EXAM: Difficulty breathing
COMMENTS:

[view not recorded]
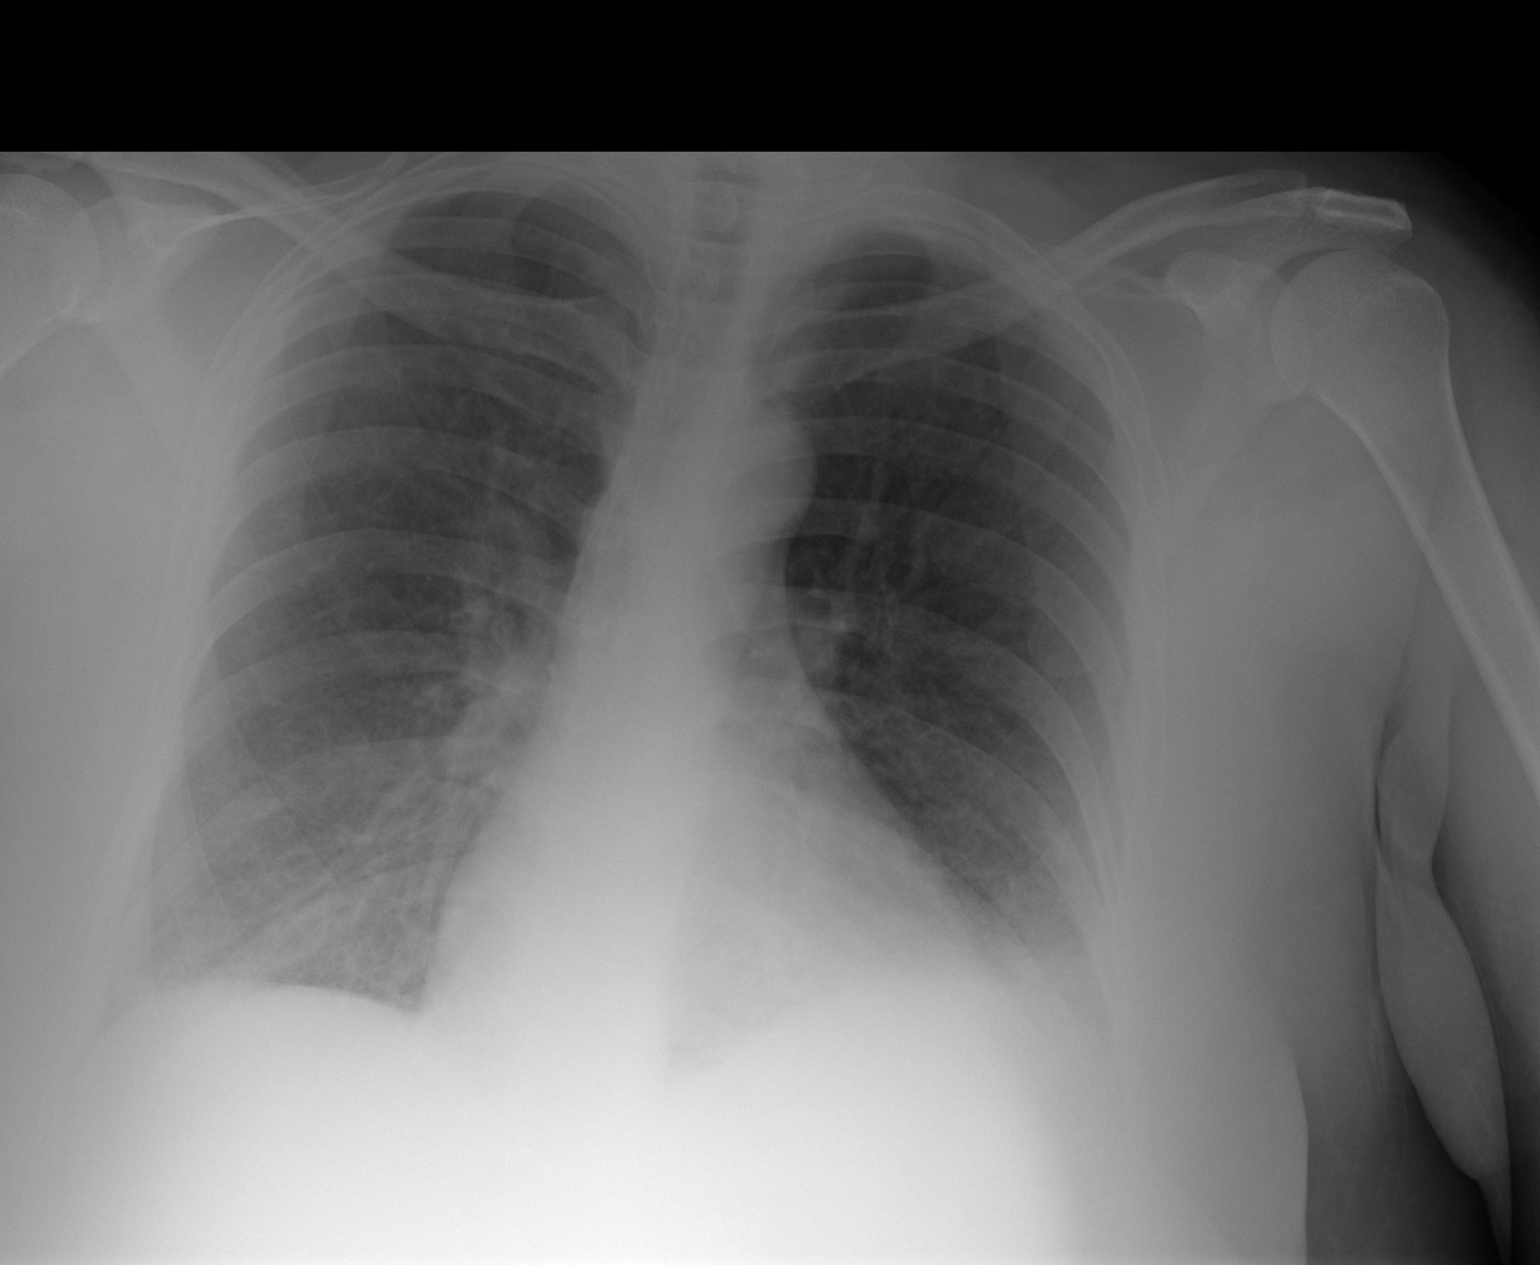

[1 of 1 positions shown; findings below may reference images not displayed]

PROCEDURE:     DXR - DXR PORTABLE CHEST SINGLE VIEW  - April 30, 2007  [DATE]

RESULT:     Comparison is made to a prior exam of 02/17/2007.

There is minimal haziness at the LEFT base suspicious for LEFT basilar
atelectasis or infiltrate. The RIGHT lung field is clear. The heart size is
normal. No pulmonary edema or pleural effusion is seen.
IMPRESSION: There is haziness at the LEFT base. This could be secondary
to overlying soft tissue but the possibility of infiltrate or atelectasis
cannot be ruled out. Follow-up exam including a lateral view is recommended
when clinically feasible.

## 2008-09-09 ENCOUNTER — Emergency Department: Payer: Self-pay | Admitting: Emergency Medicine

## 2008-10-19 IMAGING — CR DG CHEST 1V PORT
1 series · 1 of 1 positions shown · non-contrast
Comparison: none

REASON FOR EXAM: SOB; cough with yellow phlegm; [HOSPITAL]
COMMENTS:

PROCEDURE:     DXR - DXR PORTABLE CHEST SINGLE VIEW  - June 20, 2007  [DATE]
RESULT:       The lungs are clear.  The cardiac silhouette and visualized
bony skeleton are unremarkable.

[view not recorded]
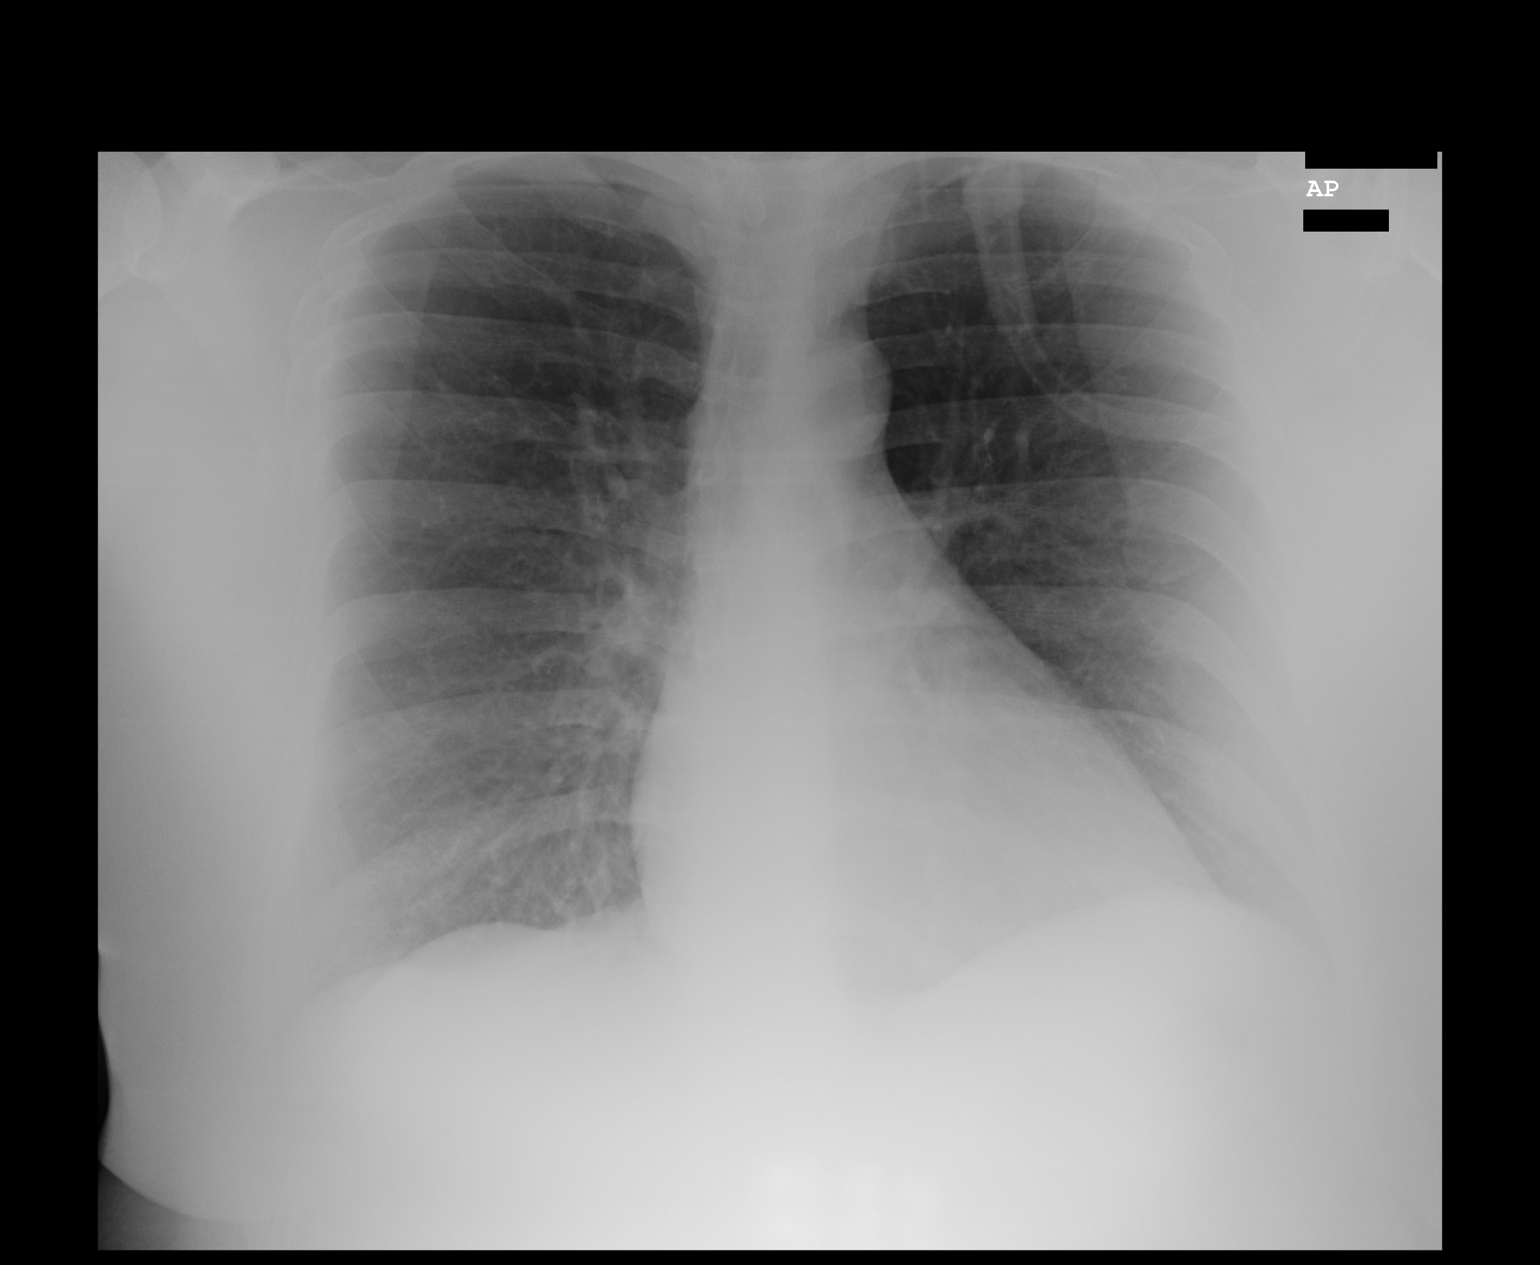

[1 of 1 positions shown; findings below may reference images not displayed]

IMPRESSION: Chest radiograph without evidence of acute cardiopulmonary disease.

## 2008-10-29 ENCOUNTER — Emergency Department: Payer: Self-pay | Admitting: Emergency Medicine

## 2008-12-13 ENCOUNTER — Emergency Department: Payer: Self-pay | Admitting: Emergency Medicine

## 2008-12-28 IMAGING — CR DG CHEST 2V
1 series · 2 of 2 positions shown · non-contrast
Comparison: none

REASON FOR EXAM: dyspnea, hx of asthma
COMMENTS:

[Series 1: view not recorded · 0.17mm/px · 2 of 2 slices shown]
[im 1/2]
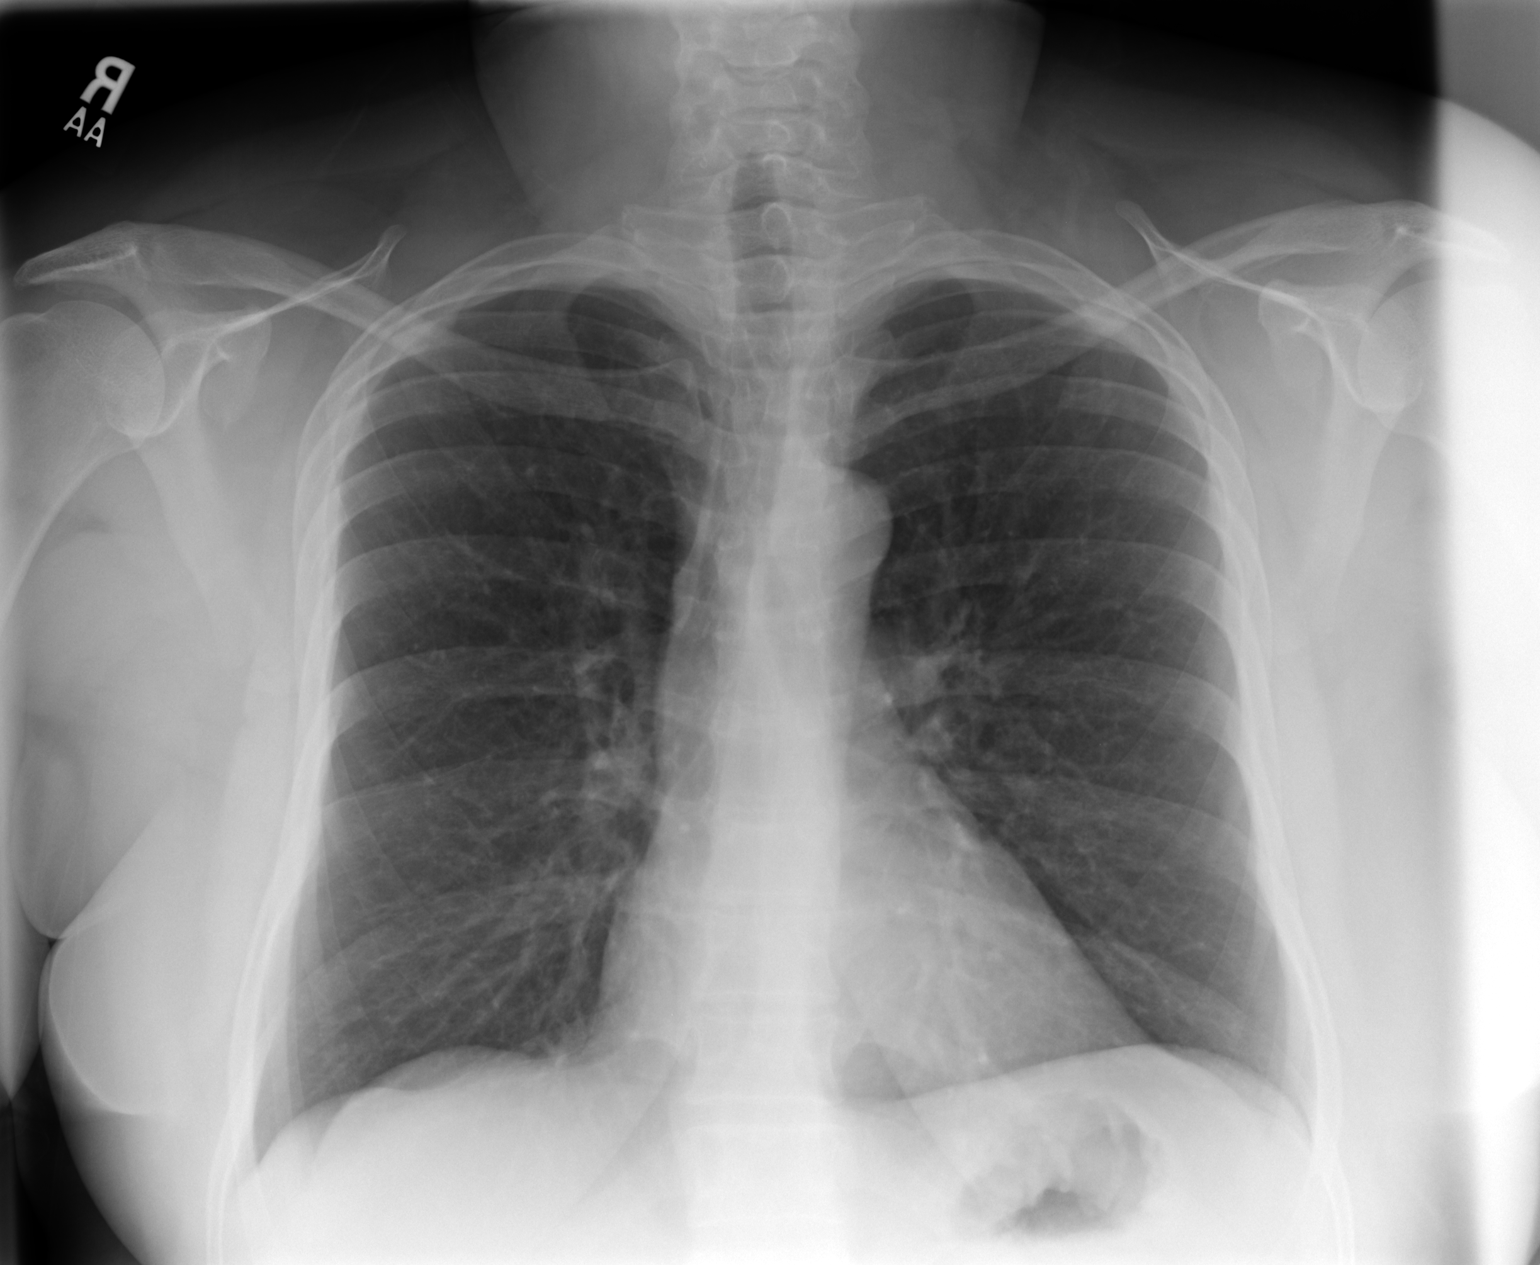
[im 2/2]
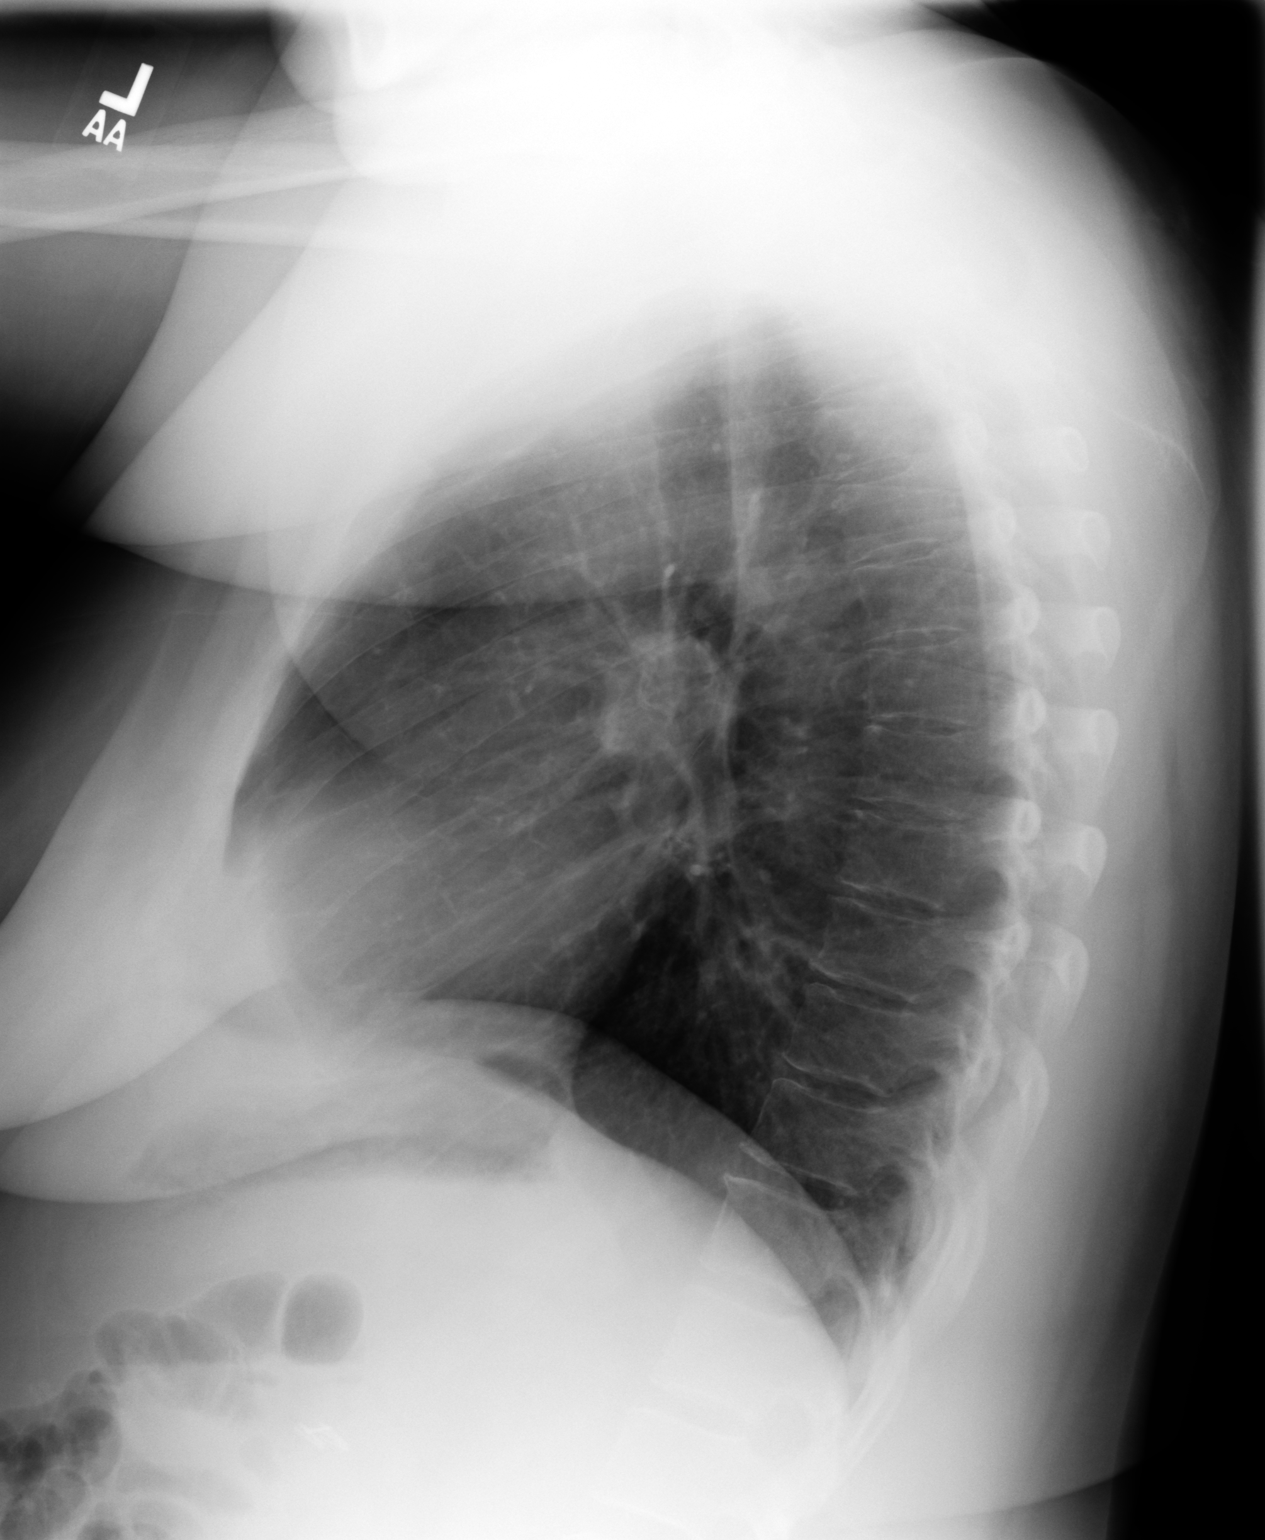

[2 of 2 positions shown; findings below may reference images not displayed]

PROCEDURE:     DXR - DXR CHEST PA (OR AP) AND LATERAL  - August 29, 2007  [DATE]

RESULT:     Comparison is made to the prior exam of 06/20/2007. The lung
fields are clear. No pneumonia, pneumothorax or pleural effusion is seen.
The chest is mildly hyperexpanded consistent with the clinical history of
asthma. Heart size is normal. The mediastinal and osseous structures show no
significant abnormalities.
IMPRESSION: 1.     The lung fields are clear.
2.     The chest appears mildly hyperexpanded.
3.     Heart size is normal.

## 2009-01-31 IMAGING — CR DG CHEST 1V PORT
1 series · 1 of 1 positions shown · non-contrast
Comparison: none

REASON FOR EXAM: sob
COMMENTS:

PROCEDURE:     DXR - DXR PORTABLE CHEST SINGLE VIEW  - October 02, 2007  [DATE]
RESULT:      Comparison is made to a prior study dated 06/20/07.
The lungs are clear.  The cardiac silhouette and visualized bony skeleton
are unremarkable.

[view not recorded]
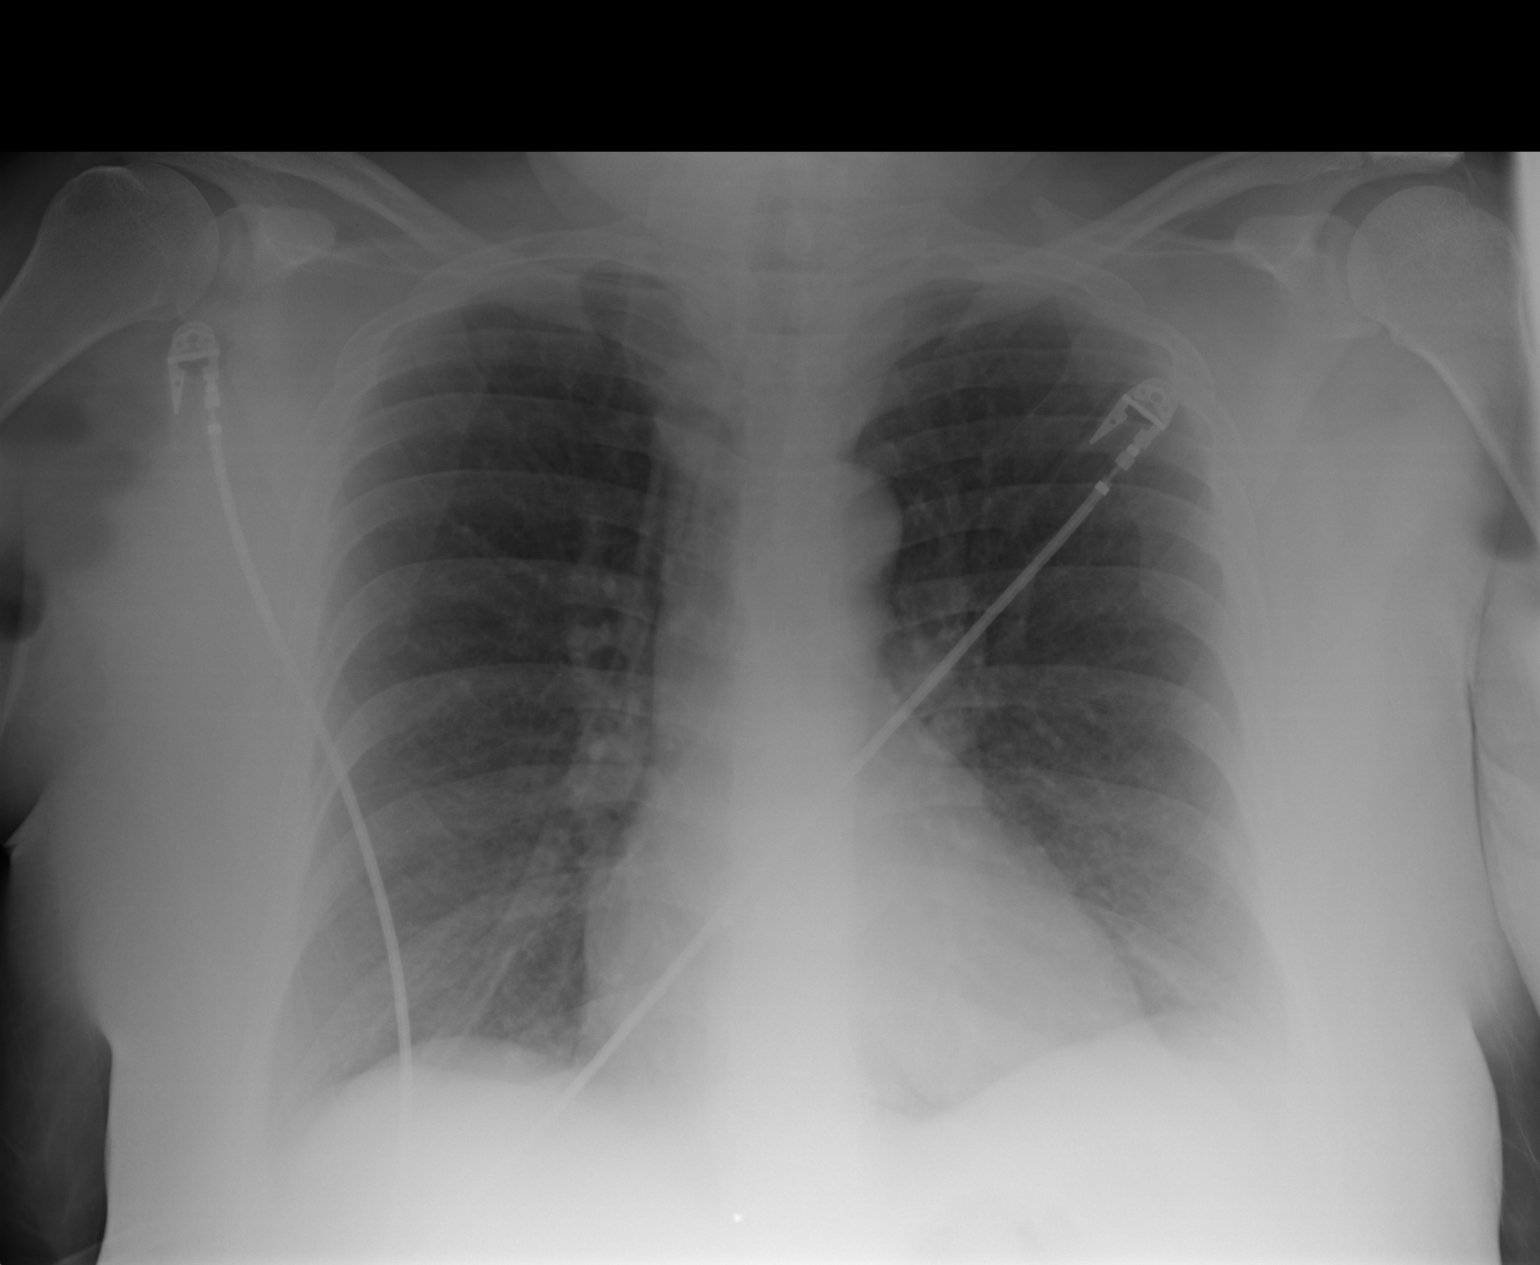

[1 of 1 positions shown; findings below may reference images not displayed]

IMPRESSION: Chest radiograph without evidence of acute cardiopulmonary disease.

## 2009-02-04 ENCOUNTER — Emergency Department: Payer: Self-pay | Admitting: Internal Medicine

## 2009-02-21 ENCOUNTER — Emergency Department: Payer: Self-pay | Admitting: Emergency Medicine

## 2009-04-27 ENCOUNTER — Emergency Department: Payer: Self-pay | Admitting: Emergency Medicine

## 2009-05-10 ENCOUNTER — Emergency Department: Payer: Self-pay | Admitting: Emergency Medicine

## 2009-05-19 ENCOUNTER — Emergency Department: Payer: Self-pay | Admitting: Internal Medicine

## 2009-08-21 IMAGING — CR DG CHEST 2V
1 series · 2 of 2 positions shown · non-contrast
Comparison: none

REASON FOR EXAM: Productive cough
COMMENTS:

[Series 1: view not recorded · 0.17mm/px · 2 of 2 slices shown]
[im 1/2]
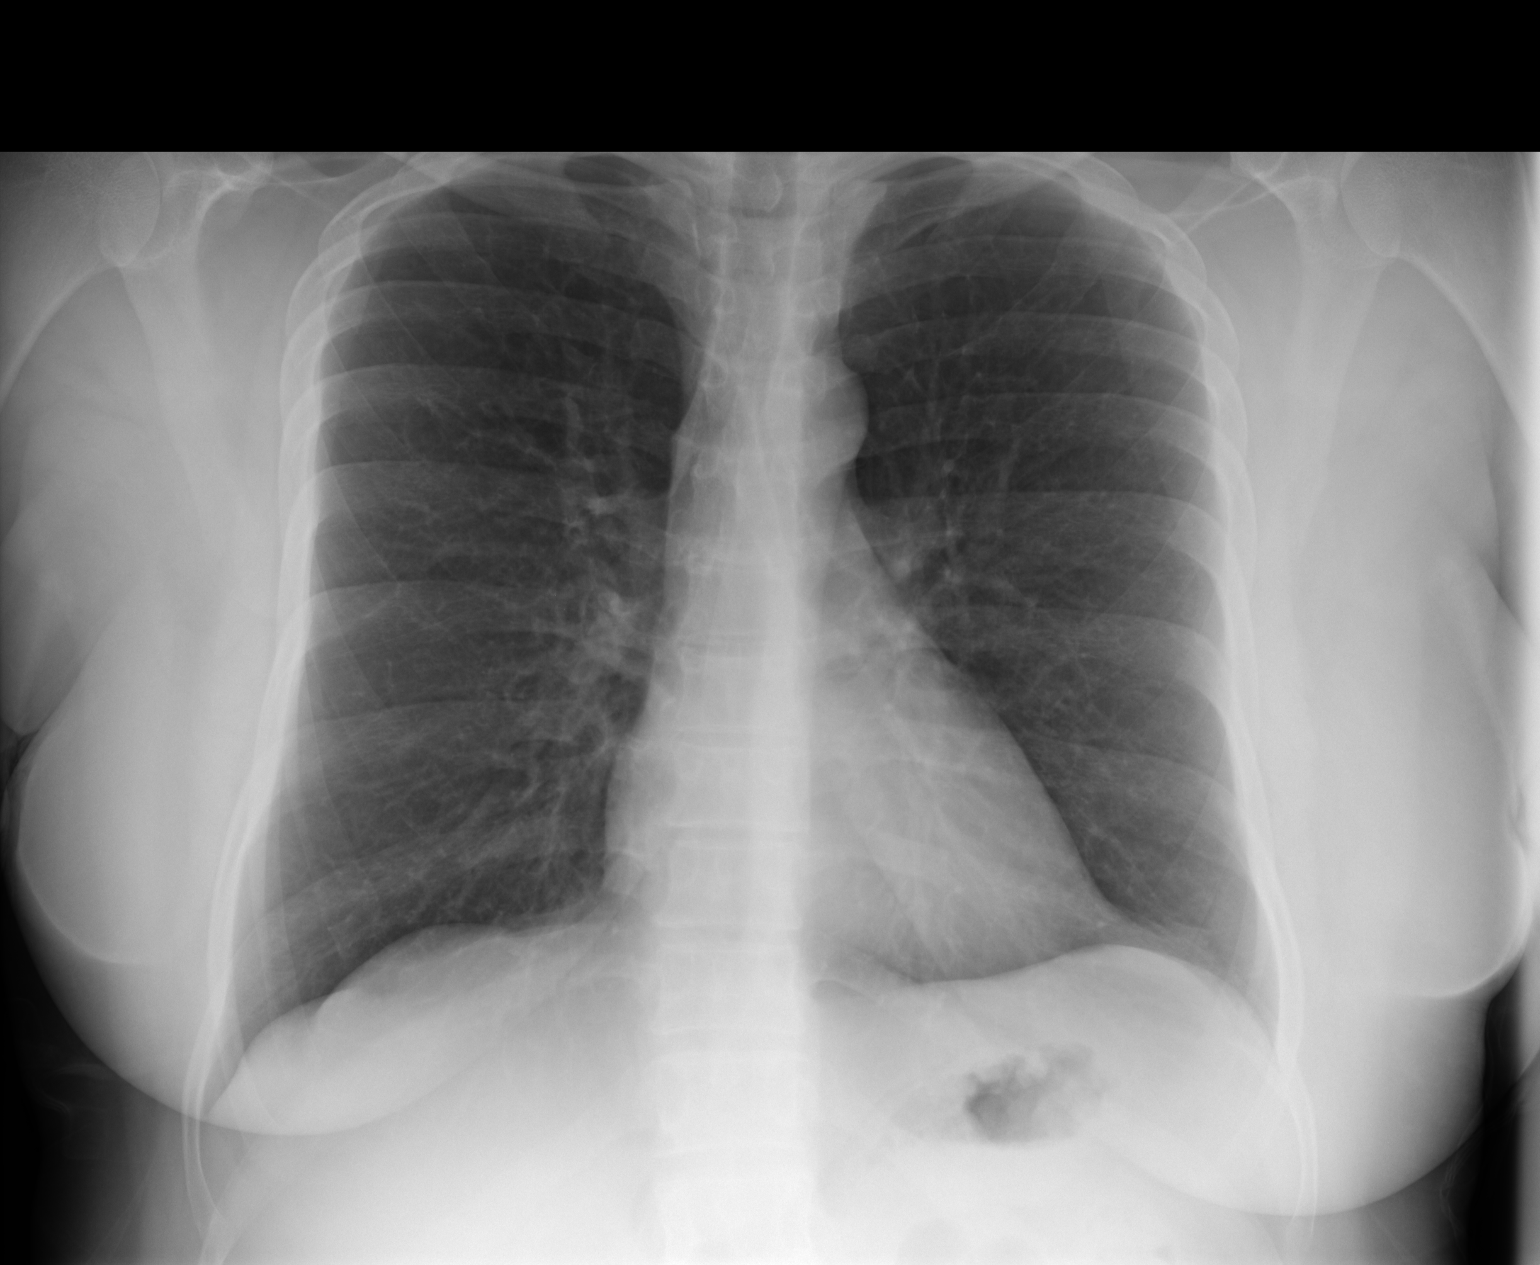
[im 2/2]
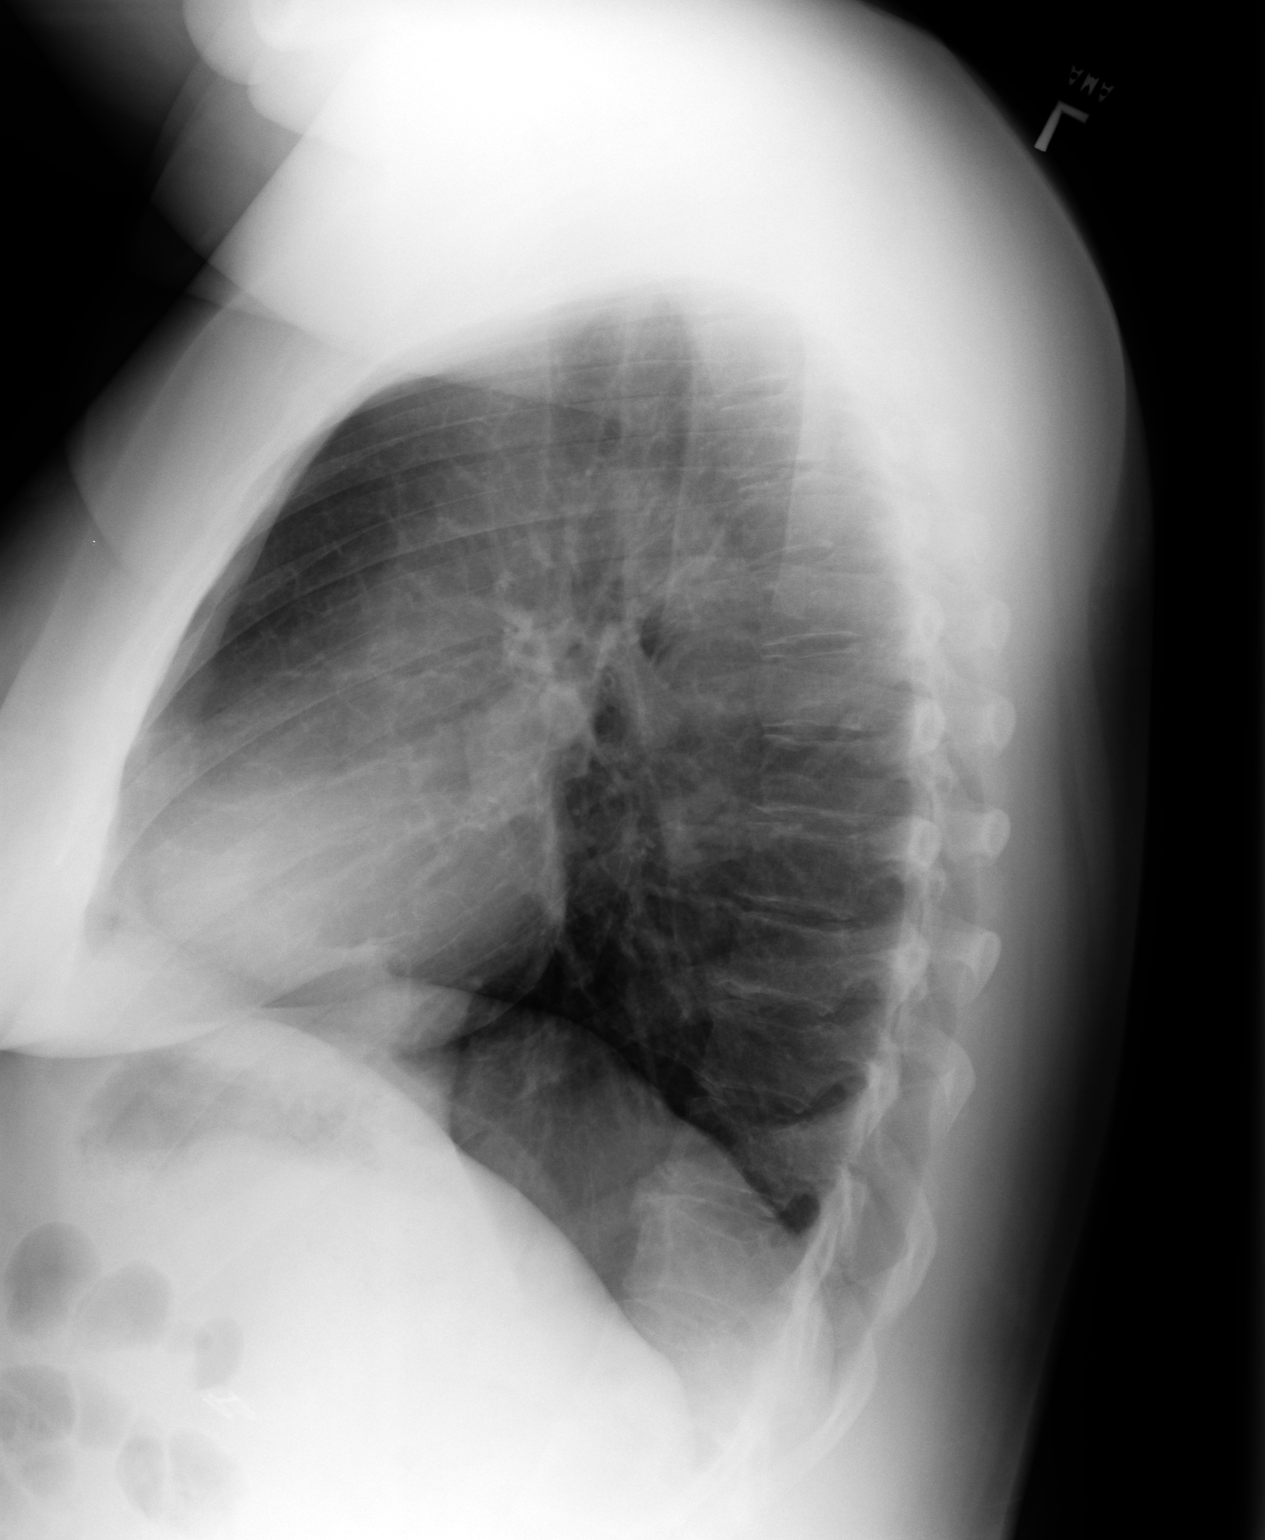

[2 of 2 positions shown; findings below may reference images not displayed]

PROCEDURE:     DXR - DXR CHEST PA (OR AP) AND LATERAL  - April 21, 2008  [DATE]

RESULT:     Comparison is made to a prior exam of 01/12/2008.

The lung fields are clear. The heart, mediastinal and osseous structures
show no significant abnormalities. The chest appears hyperexpanded
compatible with a history of COPD or asthma.
IMPRESSION: 1.  The lung fields are clear.
2.  The heart size is normal.
3.  The chest is mildly hyperexpanded.

## 2009-08-28 ENCOUNTER — Emergency Department: Payer: Self-pay | Admitting: Emergency Medicine

## 2009-09-13 IMAGING — CR DG CHEST 2V
1 series · 2 of 2 positions shown · non-contrast
Comparison: none

REASON FOR EXAM: Shortness of Breath
COMMENTS:

[Series 1: view not recorded · 0.17mm/px · 2 of 2 slices shown]
[im 1/2]
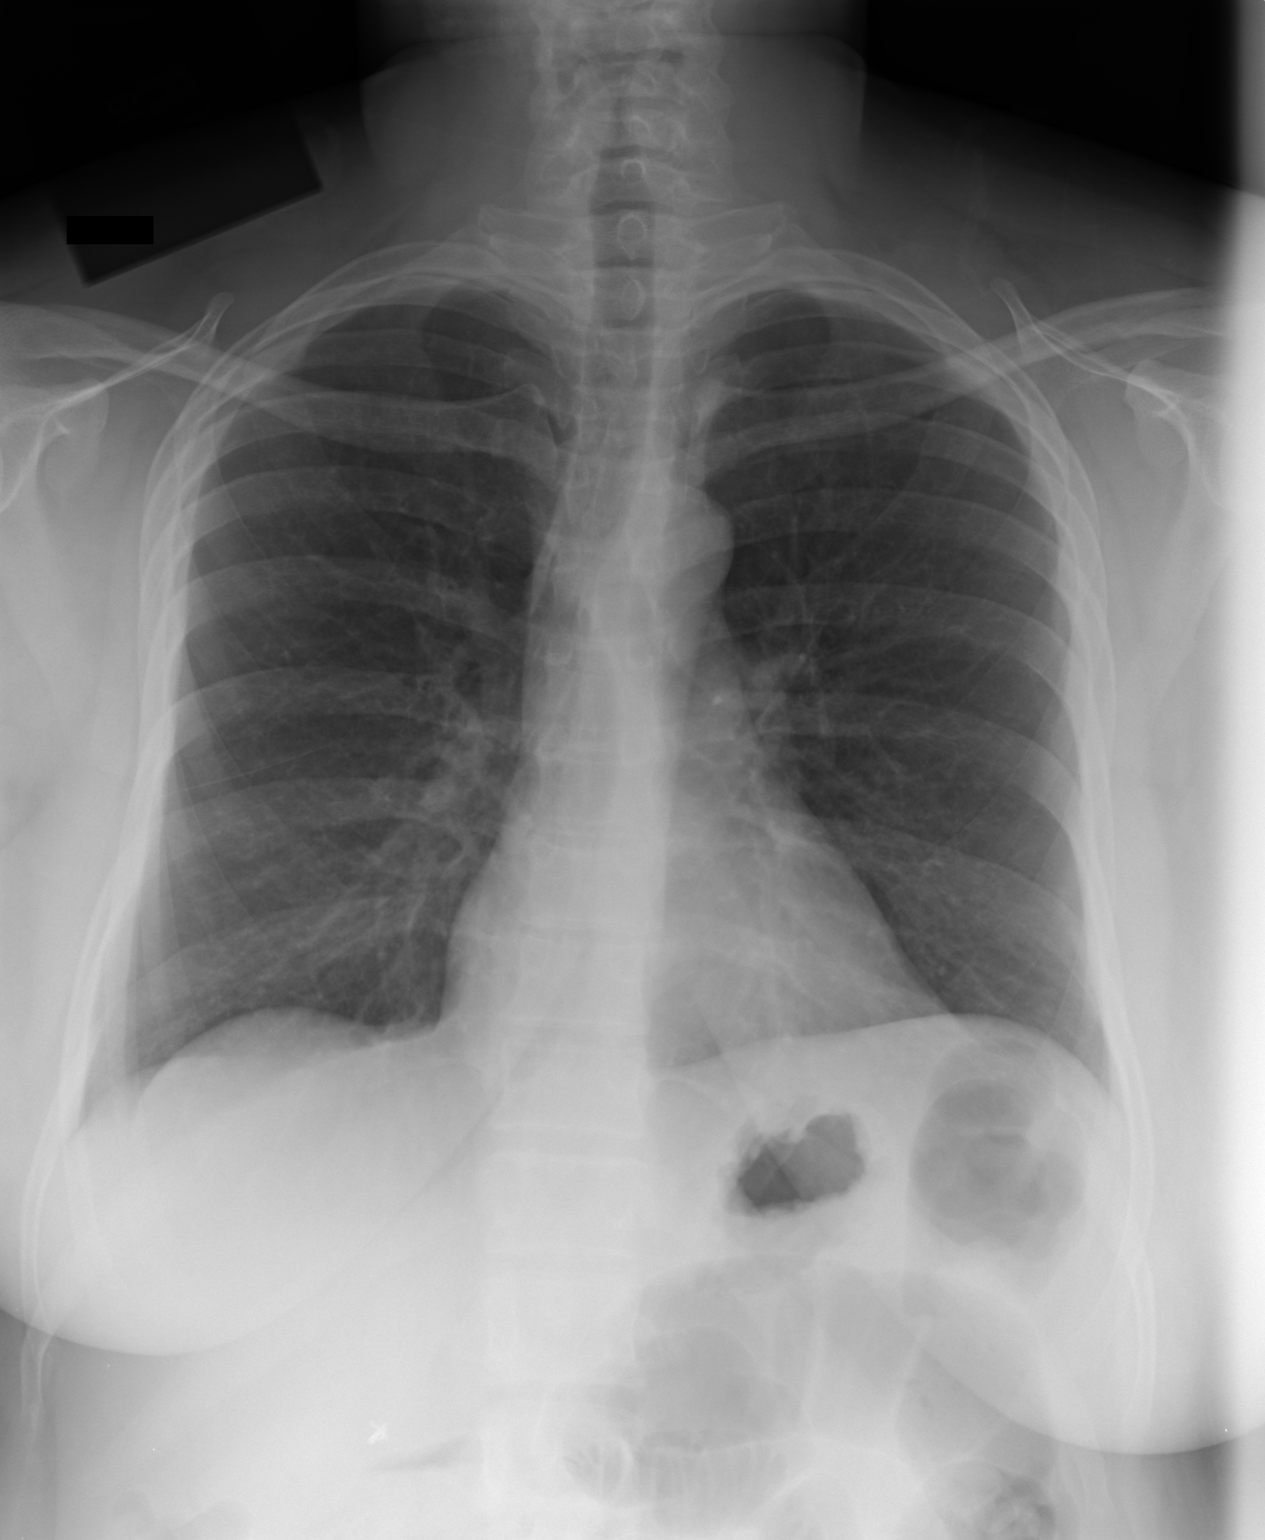
[im 2/2]
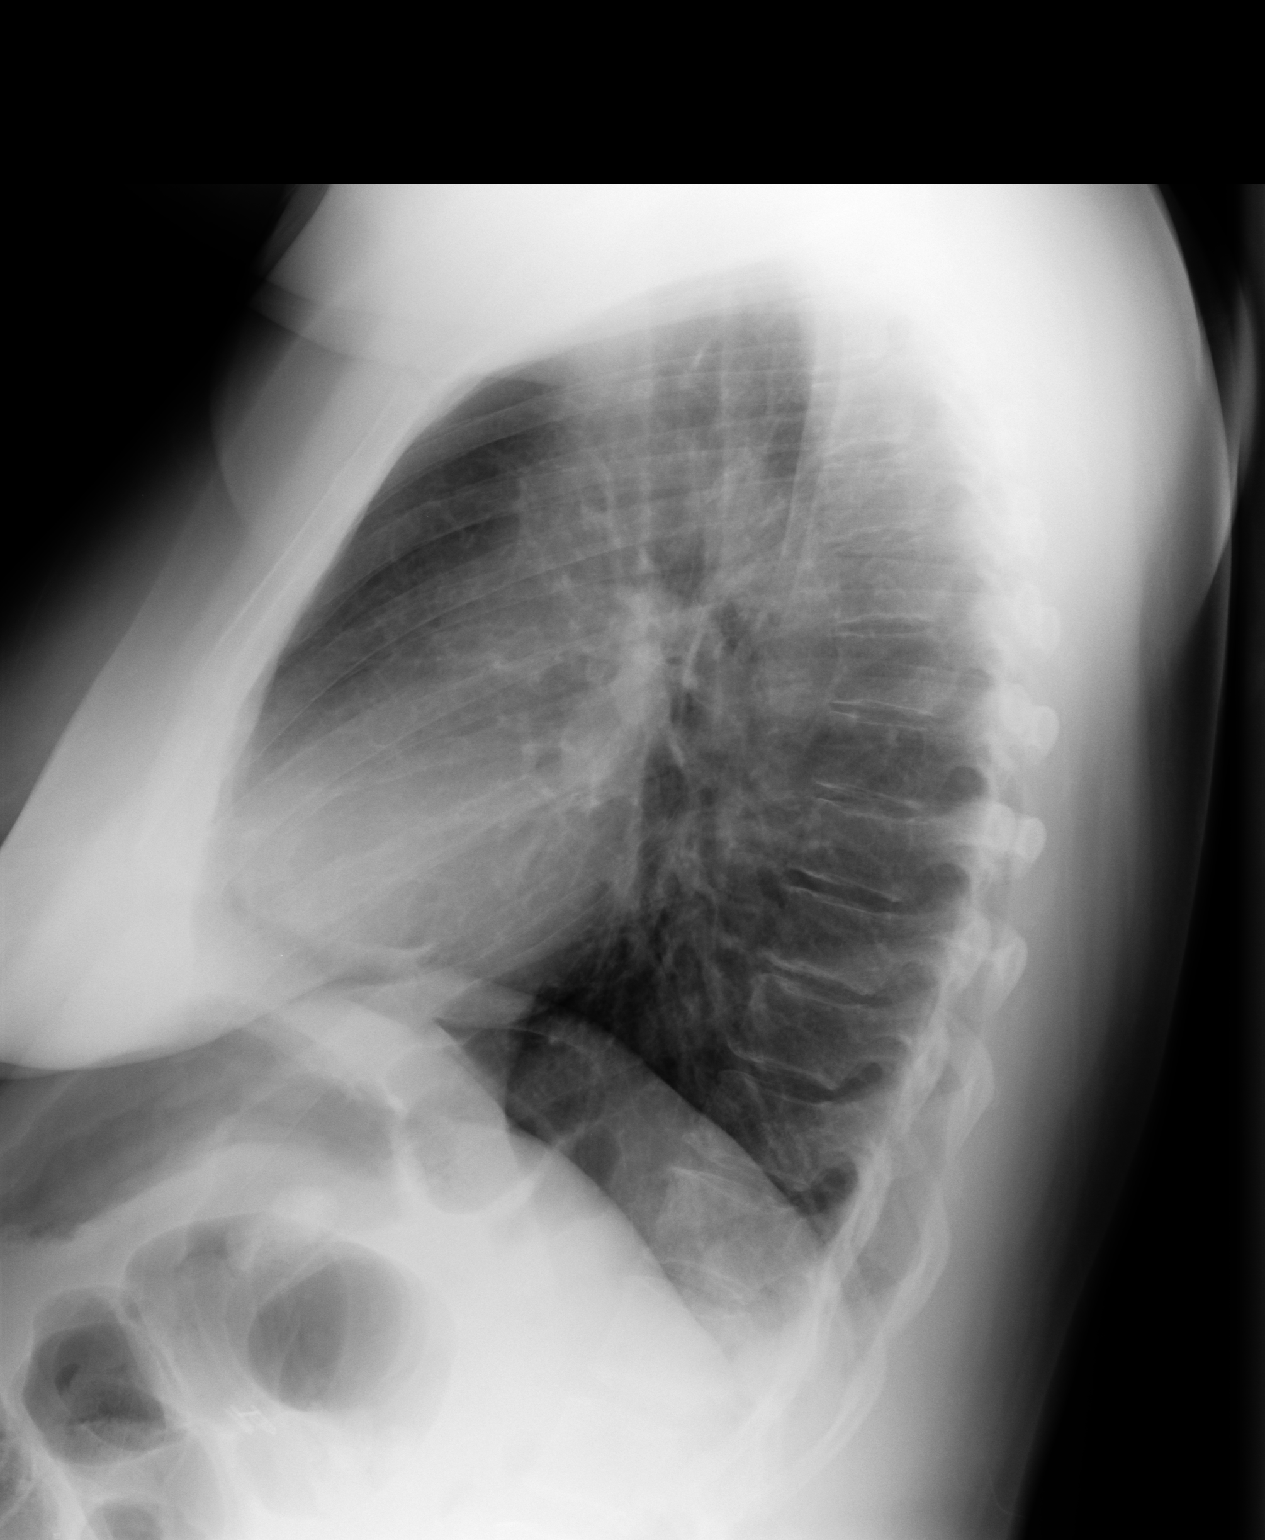

[2 of 2 positions shown; findings below may reference images not displayed]

PROCEDURE:     DXR - DXR CHEST PA (OR AP) AND LATERAL  - May 14, 2008 [DATE]

RESULT:     Comparison is made to the prior exam of 04/21/2008. The lung
fields are clear. No pneumonia, pneumothorax or pleural effusion is seen.
The heart size is normal. The mediastinal and osseous structures reveal no
significant abnormalities.
IMPRESSION: No acute changes are identified.

## 2009-10-03 IMAGING — CR DG CHEST 2V
1 series · 2 of 2 positions shown · non-contrast
Comparison: none

REASON FOR EXAM: History of Asthma and COPD with shortness of breath
COMMENTS:   LMP: One week ago

PROCEDURE:     DXR - DXR CHEST PA (OR AP) AND LATERAL  - June 03, 2008 [DATE]
RESULT:     The lung fields are clear. The heart, mediastinal and osseous
structures show no significant abnormalities and reveal no significant
interval change as compared to a prior exam 05/14/2008.

[Series 1: view not recorded · 0.17mm/px · 2 of 2 slices shown]
[im 1/2]
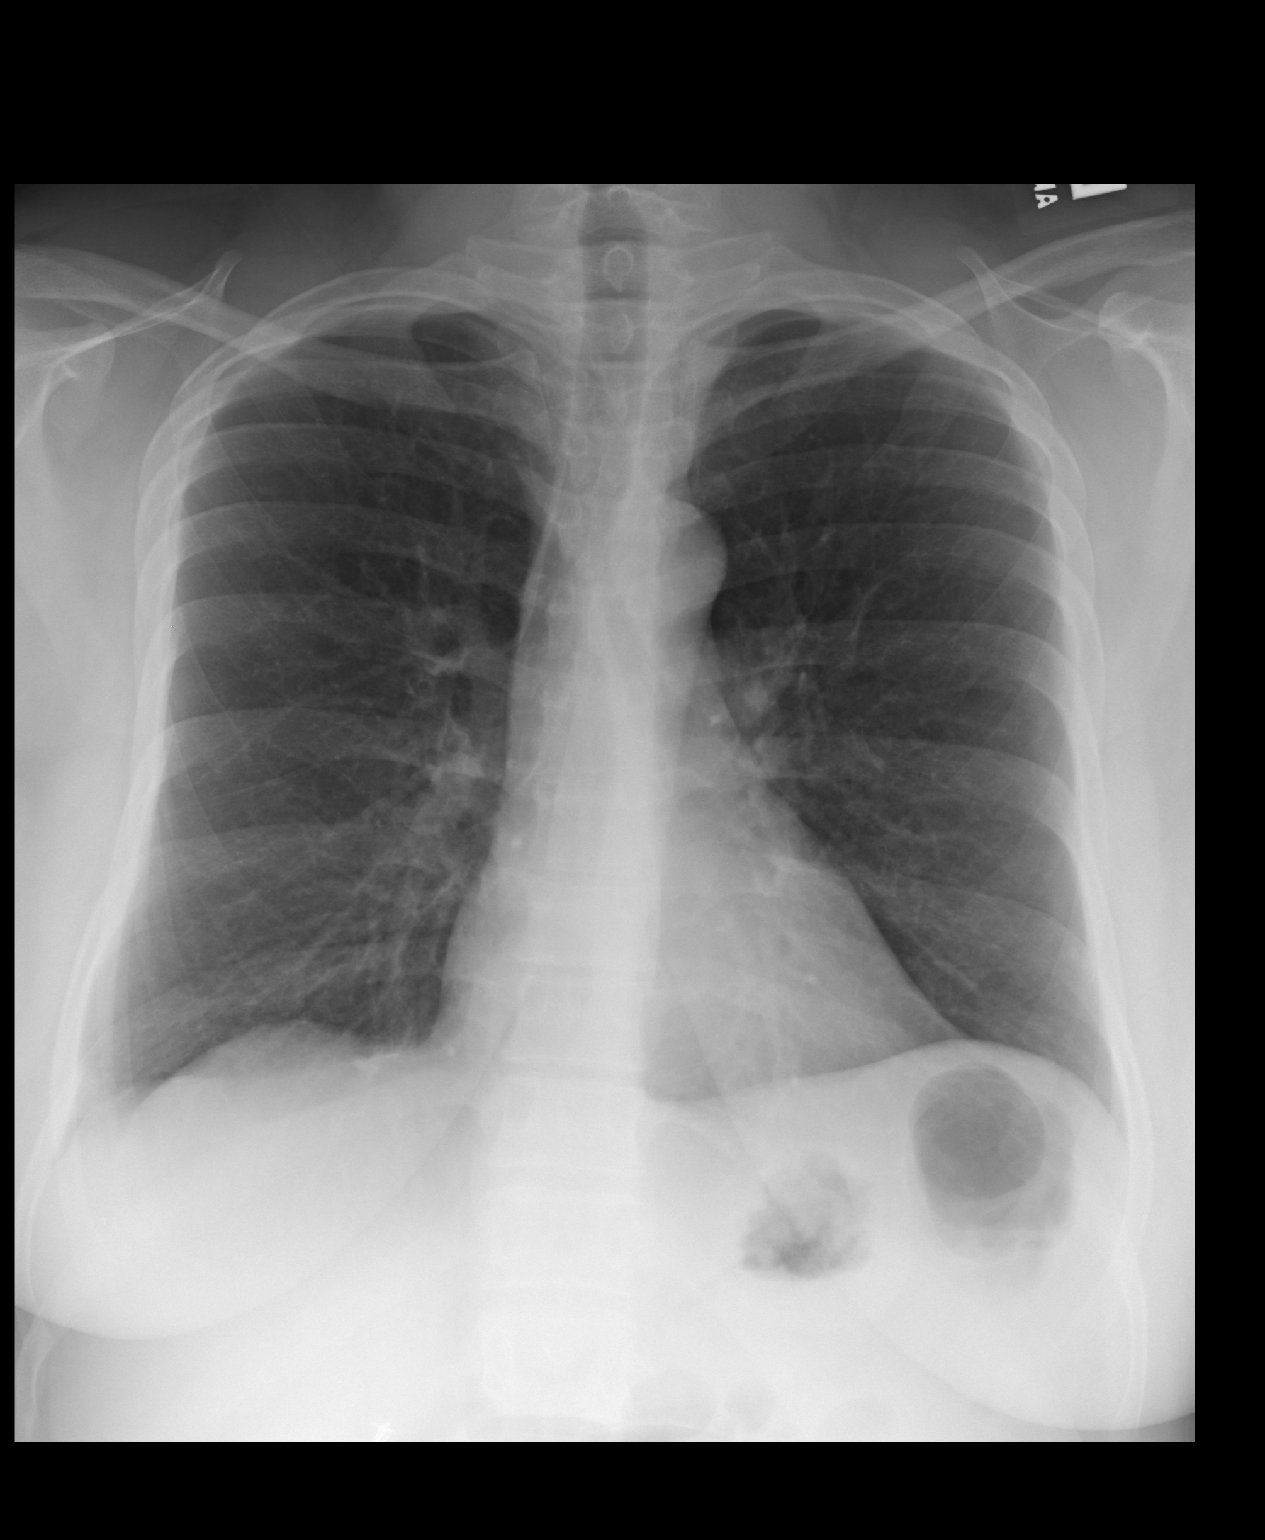
[im 2/2]
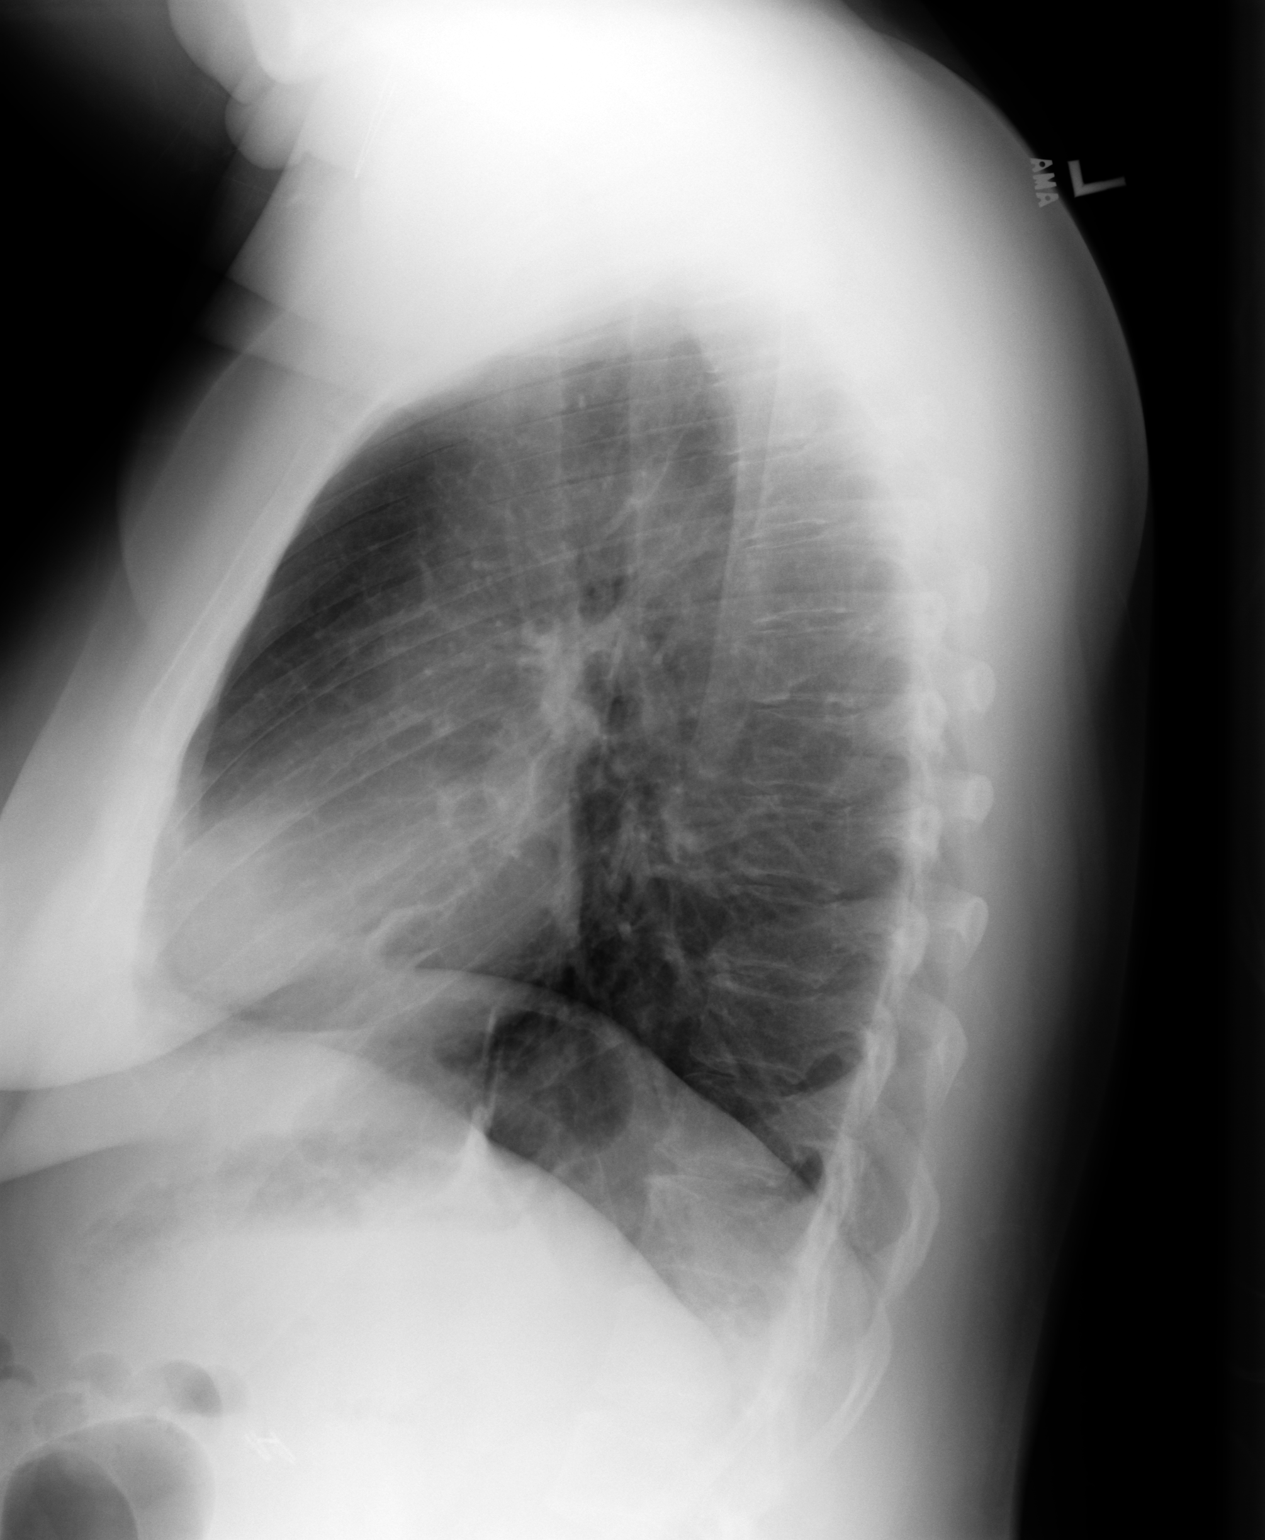

[2 of 2 positions shown; findings below may reference images not displayed]

IMPRESSION: No significant abnormalities are noted.

## 2009-10-13 ENCOUNTER — Emergency Department: Payer: Self-pay | Admitting: Emergency Medicine

## 2009-10-30 ENCOUNTER — Emergency Department: Payer: Self-pay

## 2009-11-03 IMAGING — CR DG CHEST 1V PORT
1 series · 1 of 1 positions shown · non-contrast
Comparison: none

REASON FOR EXAM: Shortness of Breath
COMMENTS:

[view not recorded]
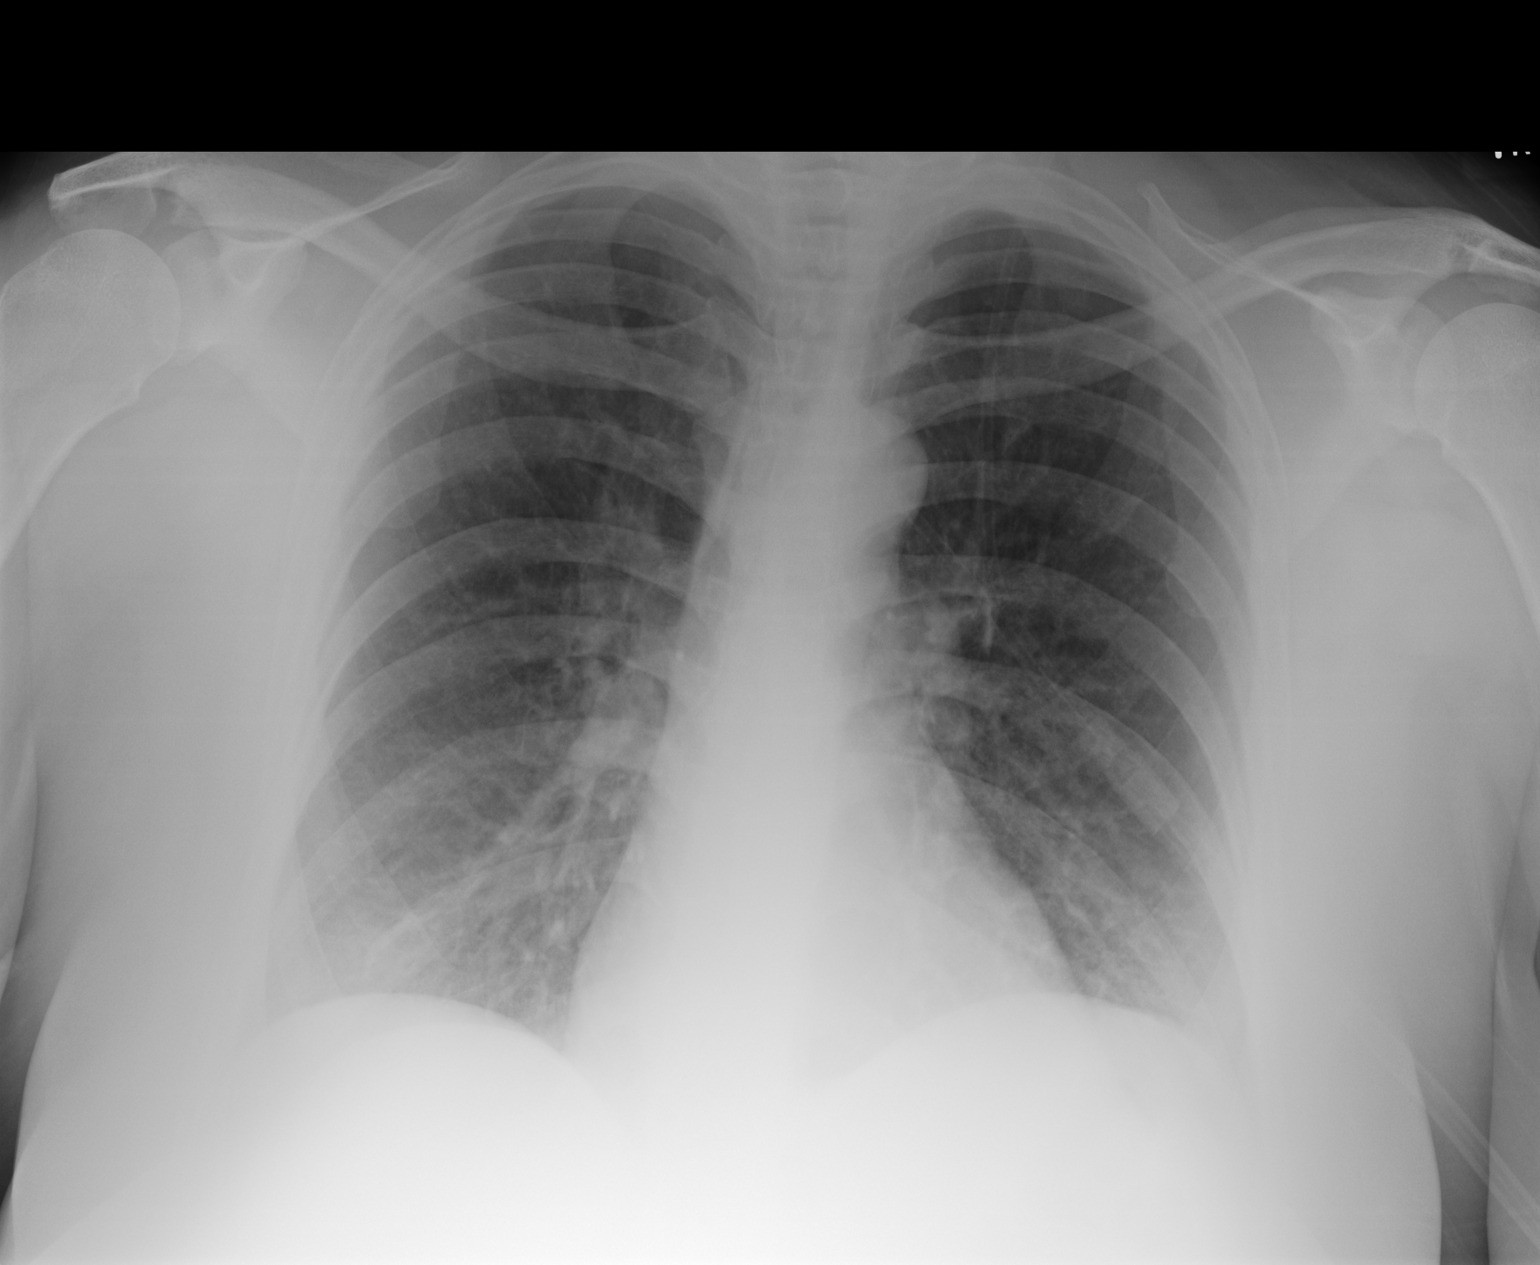

[1 of 1 positions shown; findings below may reference images not displayed]

PROCEDURE:     DXR - DXR PORTABLE CHEST SINGLE VIEW  - July 04, 2008  [DATE]

RESULT:     Comparison is made to the previous examination performed on
06/03/2008 and to the study of 05/14/2008. There is some ill-defined RIGHT
basilar atelectasis. The cardiac silhouette appears to be normal. The lungs
are otherwise clear. There is no definite effusion or pneumothorax. There is
no edema.
IMPRESSION: Minimal RIGHT basilar atelectasis.

## 2009-11-12 ENCOUNTER — Emergency Department: Payer: Self-pay | Admitting: Emergency Medicine

## 2009-11-20 ENCOUNTER — Inpatient Hospital Stay: Payer: Self-pay | Admitting: Internal Medicine

## 2009-11-23 IMAGING — CR DG CHEST 1V PORT
1 series · 1 of 1 positions shown · non-contrast
Comparison: none

REASON FOR EXAM: shortness of breath history of copd
COMMENTS:

[view not recorded]
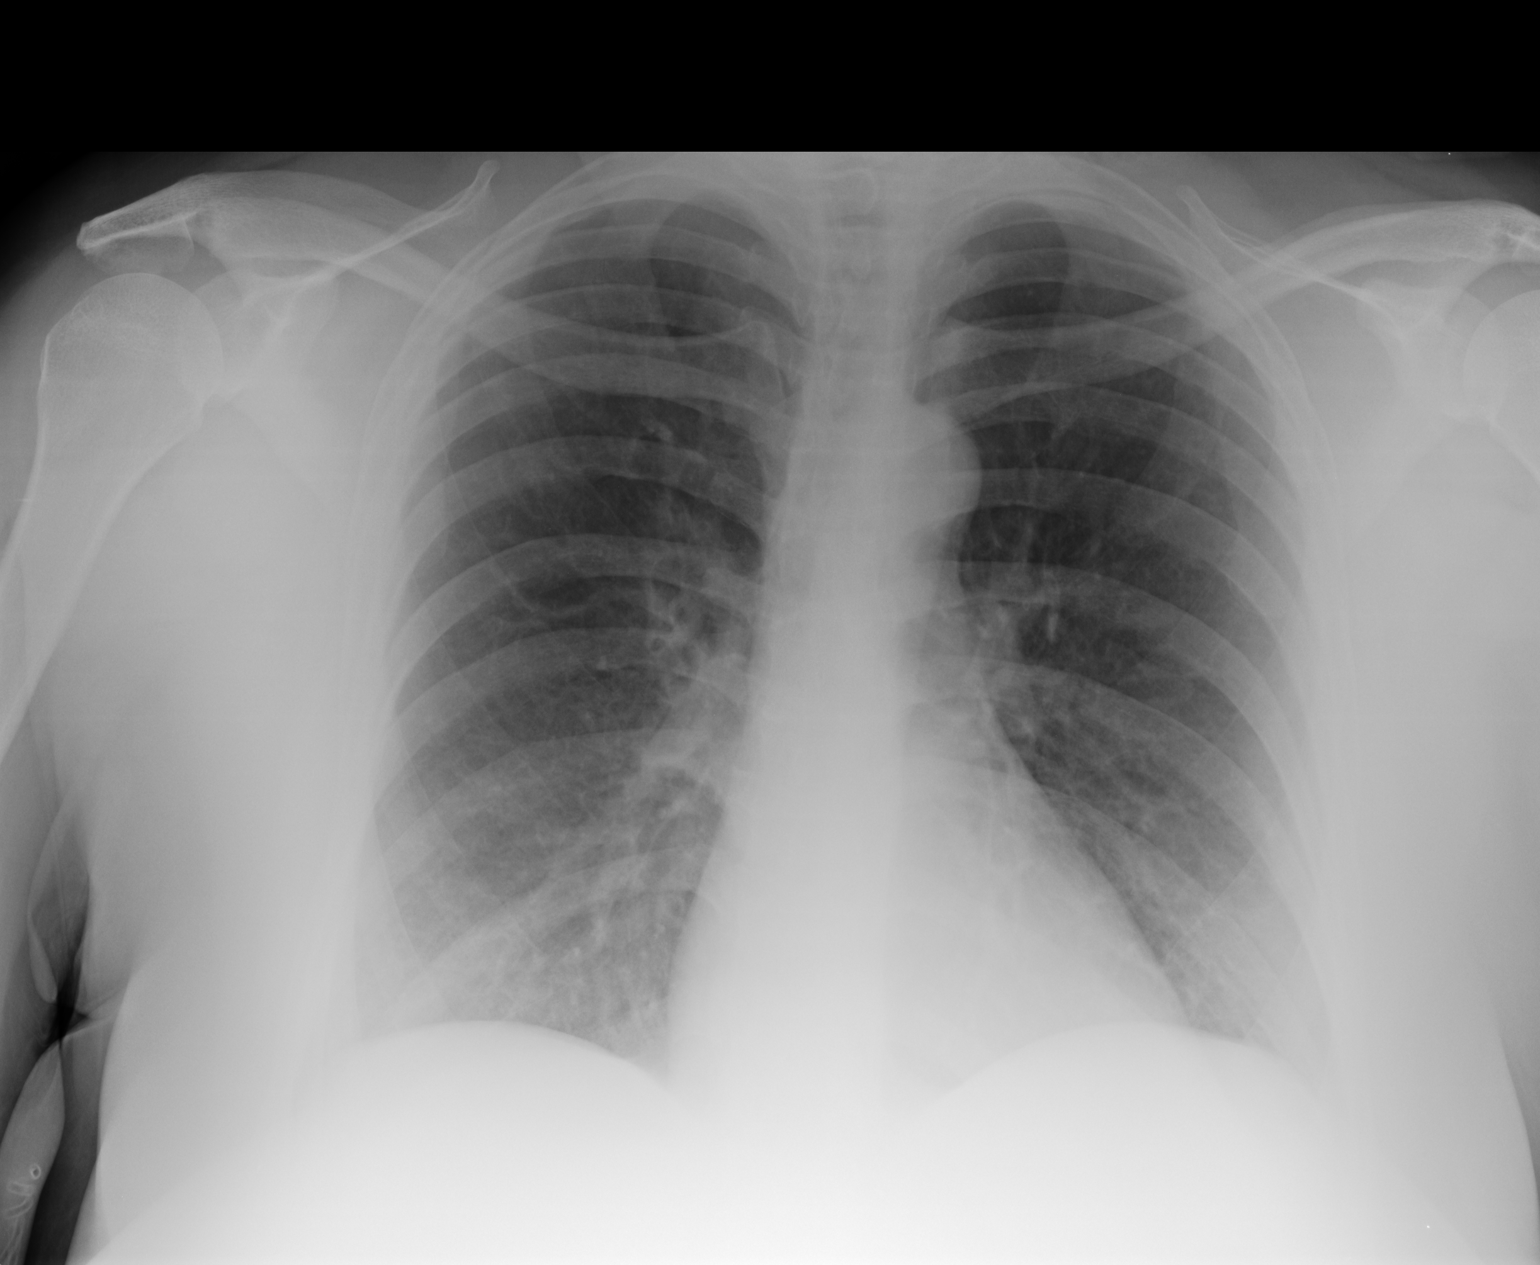

[1 of 1 positions shown; findings below may reference images not displayed]

PROCEDURE:     DXR - DXR PORTABLE CHEST SINGLE VIEW  - July 24, 2008  [DATE]

RESULT:     Comparison is made to study 04 July, 2008. The lungs are well
expanded. The interstitial markings are minimally prominent but have
improved since the prior study. The heart is not enlarged and the pulmonary
vascularity is not engorged.
IMPRESSION: There are findings consistent with air trapping secondary
to reactive airway disease or COPD. There may be low-grade acute bronchitis
as well. A followup PA and lateral chest x-ray would be of value.

## 2009-12-05 IMAGING — CR DG CHEST 1V PORT
1 series · 1 of 1 positions shown · non-contrast
Comparison: none

REASON FOR EXAM: Shortness of breath
COMMENTS:

PROCEDURE:     DXR - DXR PORTABLE CHEST SINGLE VIEW  - August 05, 2008  [DATE]
RESULT:     Comparison is made to the prior exam of 07/24/2008.  No
pneumonia, pneumothorax or pleural effusion is seen. Heart size is normal.
No significant osseous abnormalities are identified.

[view not recorded]
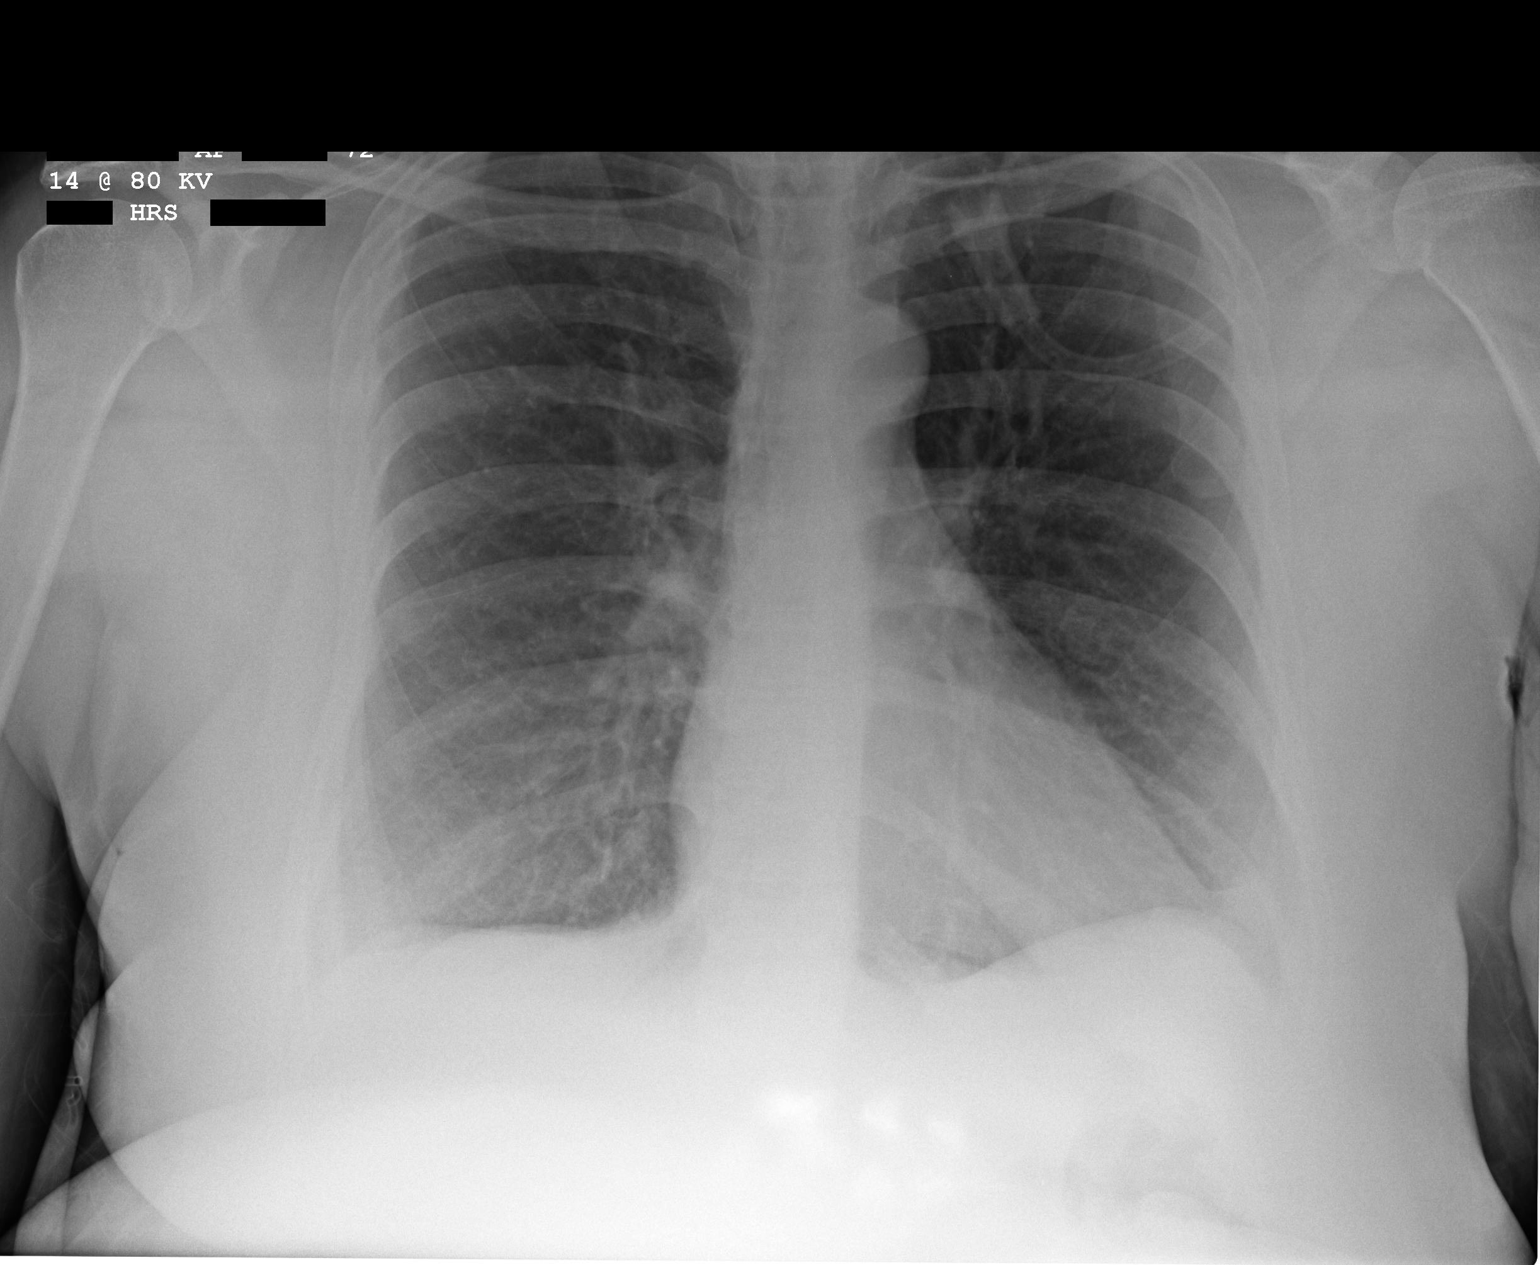

[1 of 1 positions shown; findings below may reference images not displayed]

IMPRESSION: 1.     No acute changes are identified.

## 2009-12-15 ENCOUNTER — Emergency Department: Payer: Self-pay | Admitting: Unknown Physician Specialty

## 2010-02-01 ENCOUNTER — Emergency Department: Payer: Self-pay | Admitting: Emergency Medicine

## 2010-02-17 ENCOUNTER — Emergency Department: Payer: Self-pay | Admitting: Unknown Physician Specialty

## 2010-02-28 IMAGING — CR DG CHEST 1V PORT
1 series · 1 of 1 positions shown · non-contrast
Comparison: none

REASON FOR EXAM: Shortness of Breath
COMMENTS:

PROCEDURE:     DXR - DXR PORTABLE CHEST SINGLE VIEW  - October 29, 2008  [DATE]
RESULT:     Portable AP view of the chest is compared to the prior exam of
08/05/2008. The lung fields are clear. The heart, mediastinal and osseous
structures show no acute changes.

[view not recorded]
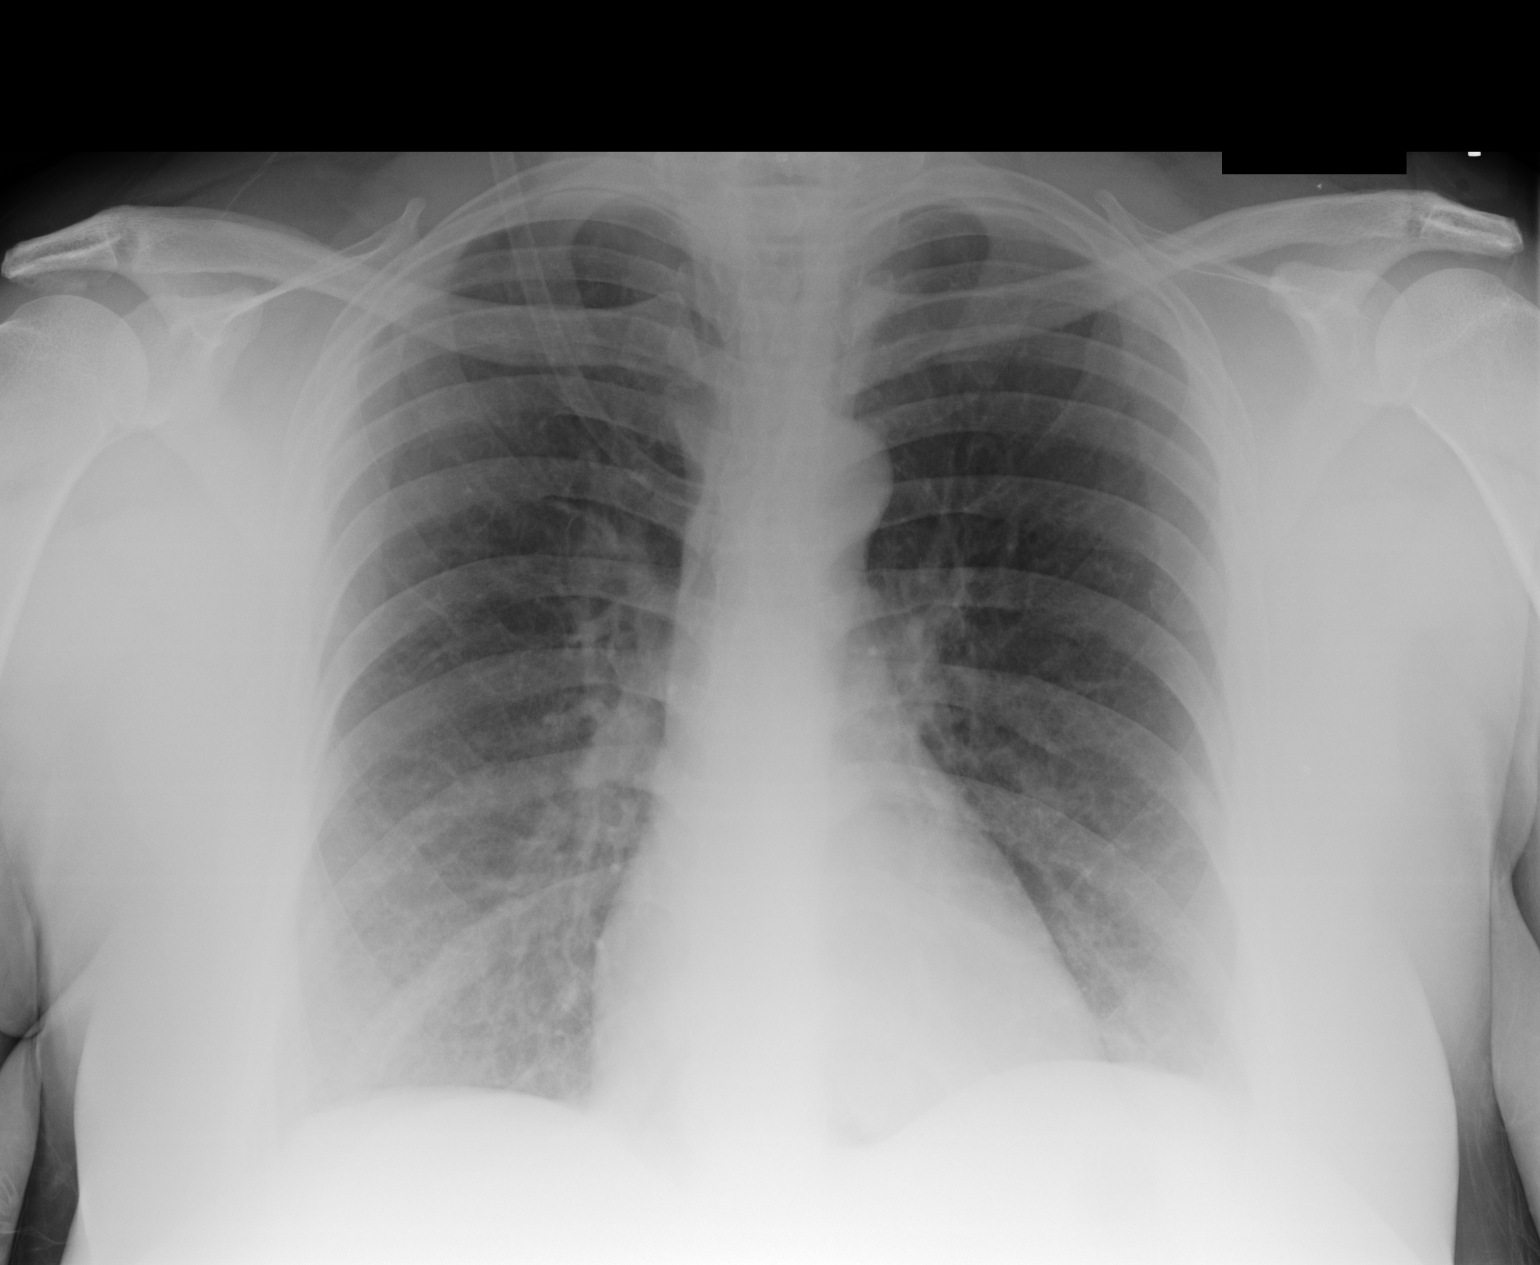

[1 of 1 positions shown; findings below may reference images not displayed]

IMPRESSION: 1.     No significant abnormalities are noted.

## 2010-03-07 ENCOUNTER — Emergency Department: Payer: Self-pay | Admitting: Unknown Physician Specialty

## 2010-04-14 IMAGING — CR DG CHEST 2V
1 series · 2 of 2 positions shown · non-contrast
Comparison: none

REASON FOR EXAM: Shortness of Breath
COMMENTS:

[Series 1: view not recorded · 0.17mm/px · 2 of 2 slices shown]
[im 1/2]
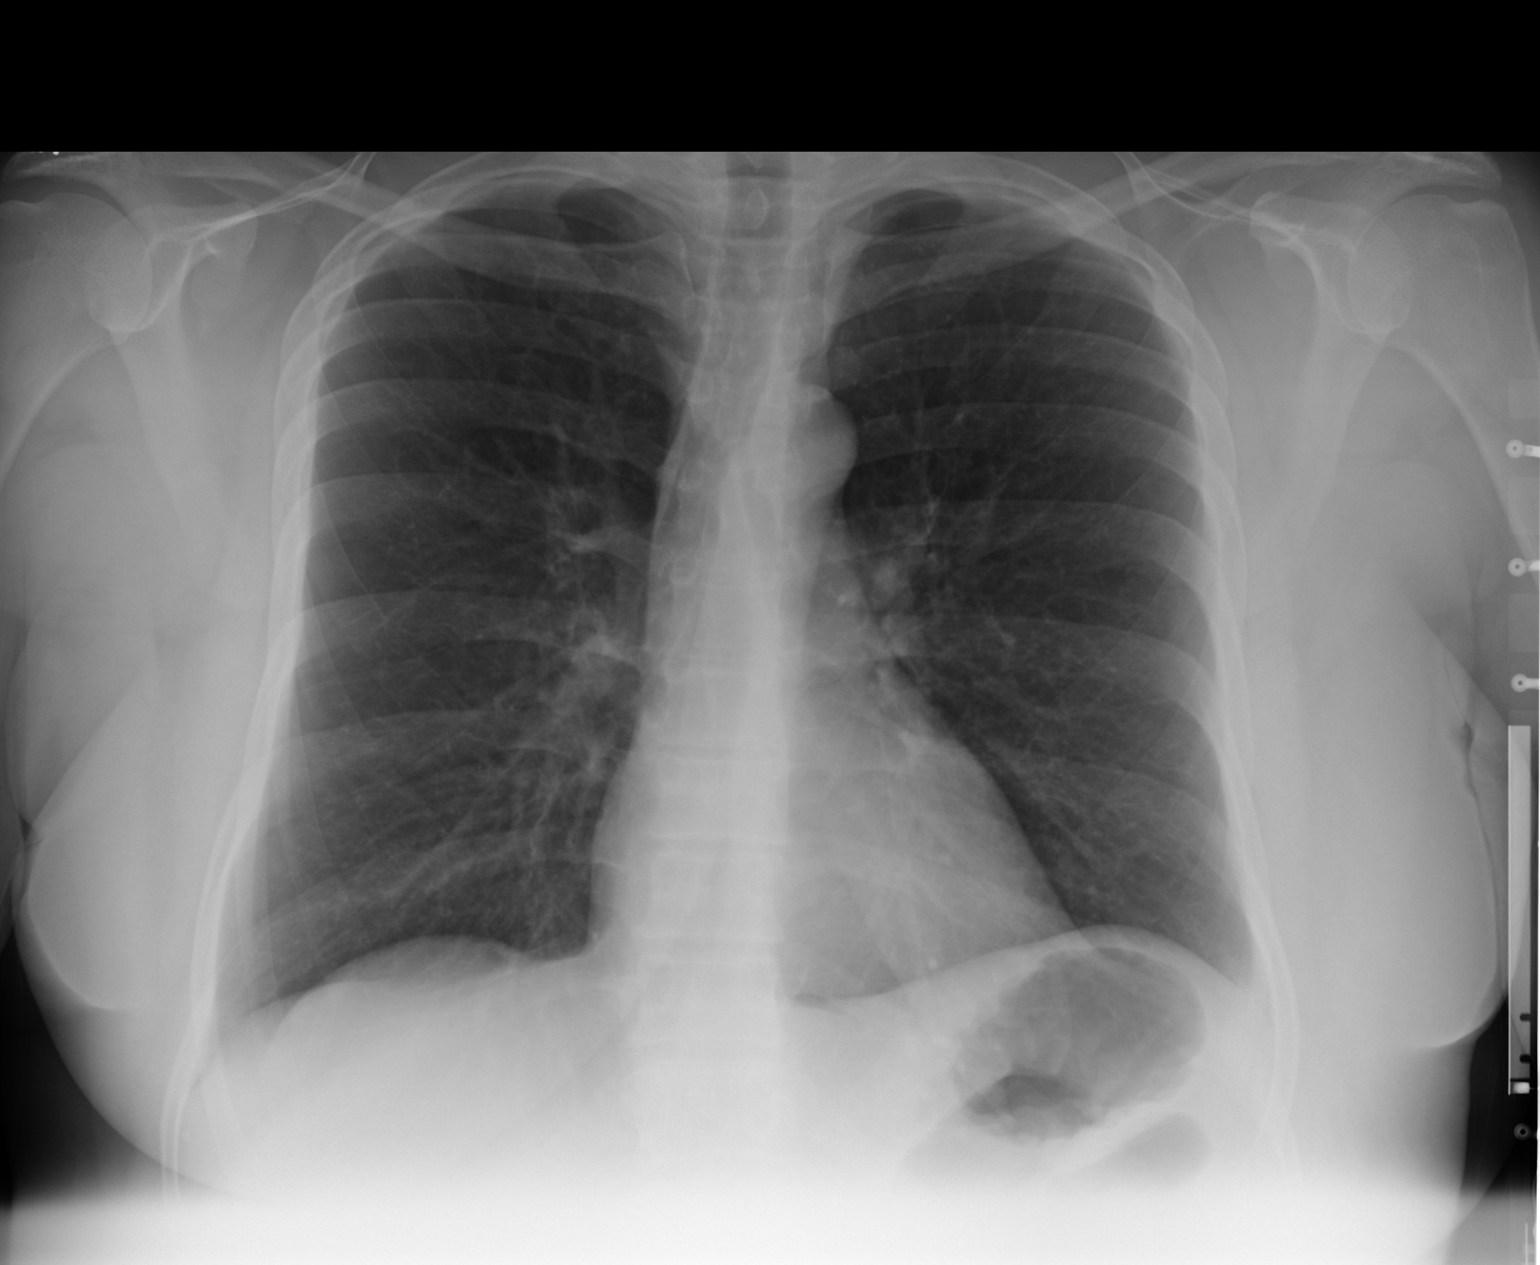
[im 2/2]
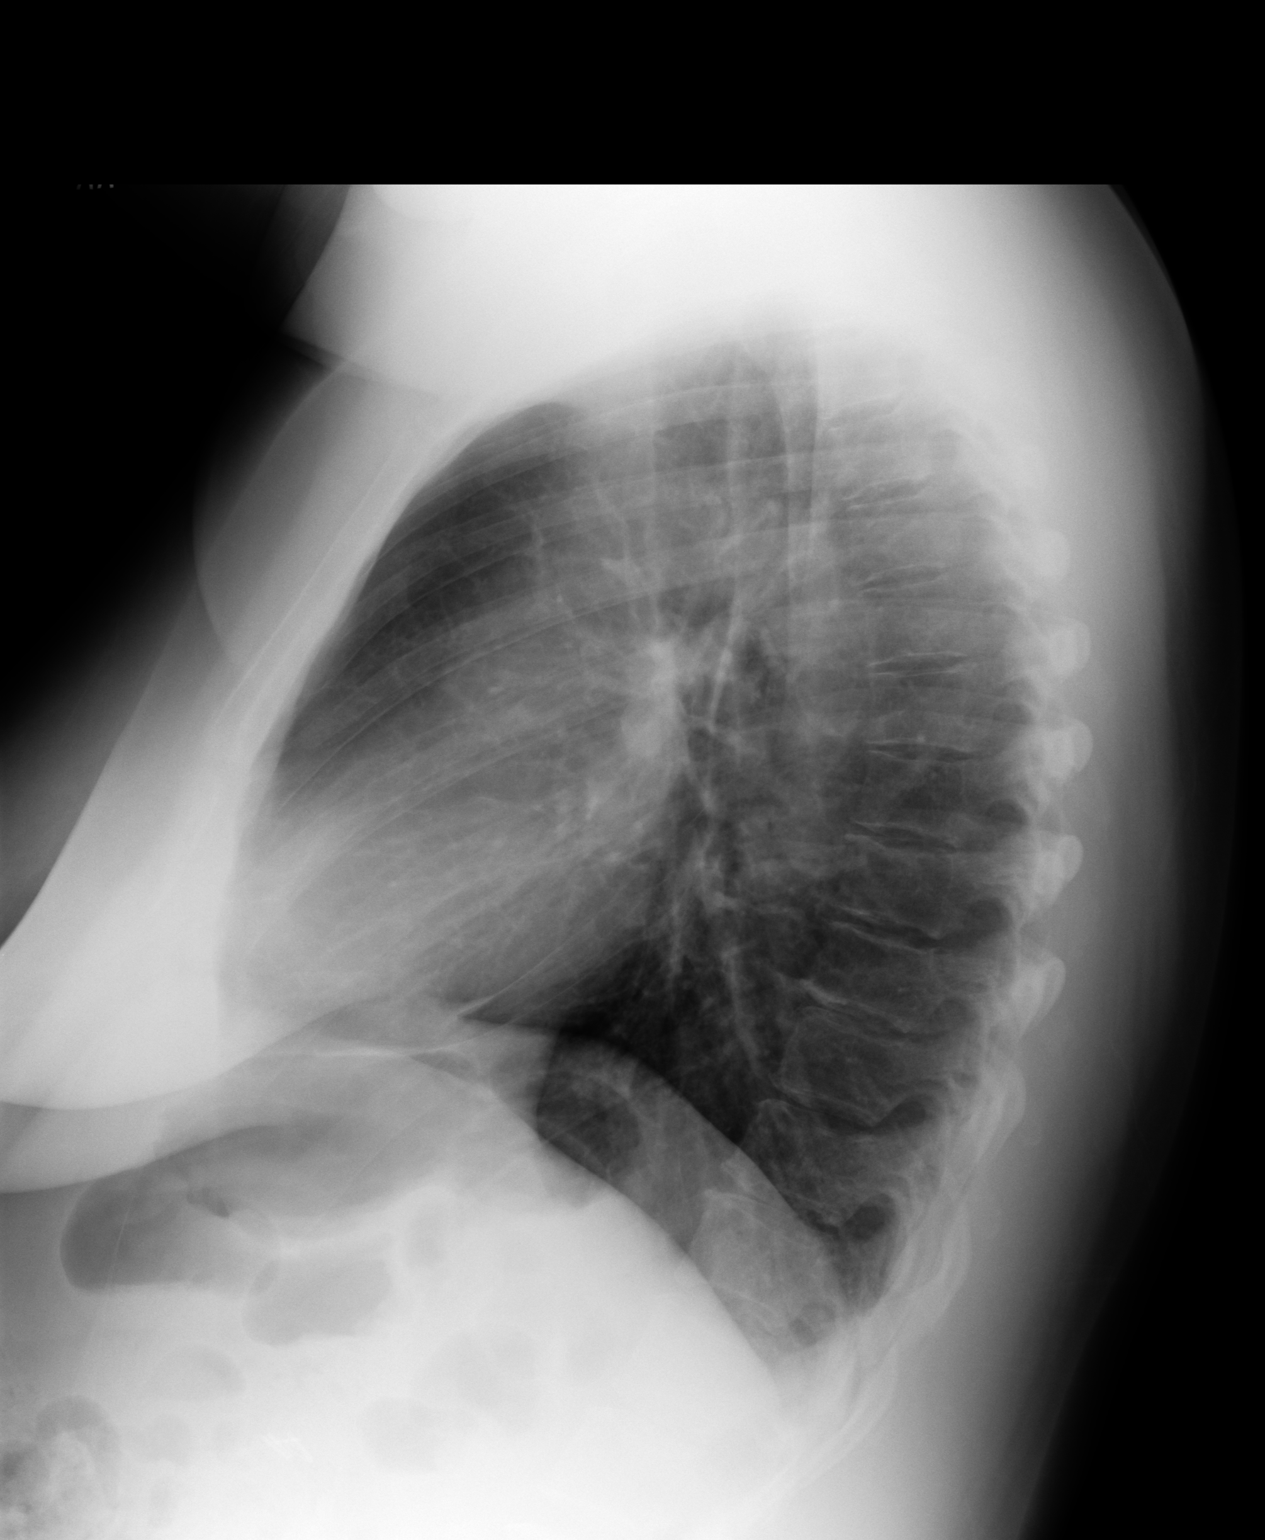

[2 of 2 positions shown; findings below may reference images not displayed]

PROCEDURE:     DXR - DXR CHEST PA (OR AP) AND LATERAL  - December 13, 2008  [DATE]

RESULT:      Comparison is made to a study 29 October, 2008.

The lungs are mildly hyperinflated. There is no focal infiltrate. The
cardiac silhouette is normal in size. The pulmonary vascularity is not
engorged. There is no pleural effusion.
IMPRESSION: There is mild hyperinflation which likely reflects the clinically known
reactive airway disease. I see no focal pneumonia. If the patient's symptoms
persist, followup films would be of value.

## 2010-04-17 ENCOUNTER — Inpatient Hospital Stay: Payer: Self-pay | Admitting: Internal Medicine

## 2010-05-30 ENCOUNTER — Inpatient Hospital Stay: Payer: Self-pay | Admitting: Internal Medicine

## 2010-07-20 ENCOUNTER — Emergency Department: Payer: Self-pay | Admitting: Emergency Medicine

## 2010-08-01 ENCOUNTER — Emergency Department: Payer: Self-pay | Admitting: Internal Medicine

## 2010-09-02 ENCOUNTER — Emergency Department: Payer: Self-pay | Admitting: Unknown Physician Specialty

## 2010-09-09 IMAGING — CR DG CHEST 2V
1 series · 2 of 2 positions shown · non-contrast
Comparison: none

REASON FOR EXAM: wheezing
COMMENTS:

PROCEDURE:     DXR - DXR CHEST PA (OR AP) AND LATERAL  - May 10, 2009 [DATE]
RESULT:     Mild left base subsegmental atelectasis is noted. It may be wise
to perform follow-up chest x-ray to exclude developing pneumonia. The
cardiovascular structures are unremarkable.

[Series 1: view not recorded · 0.17mm/px · 2 of 2 slices shown]
[im 1/2]
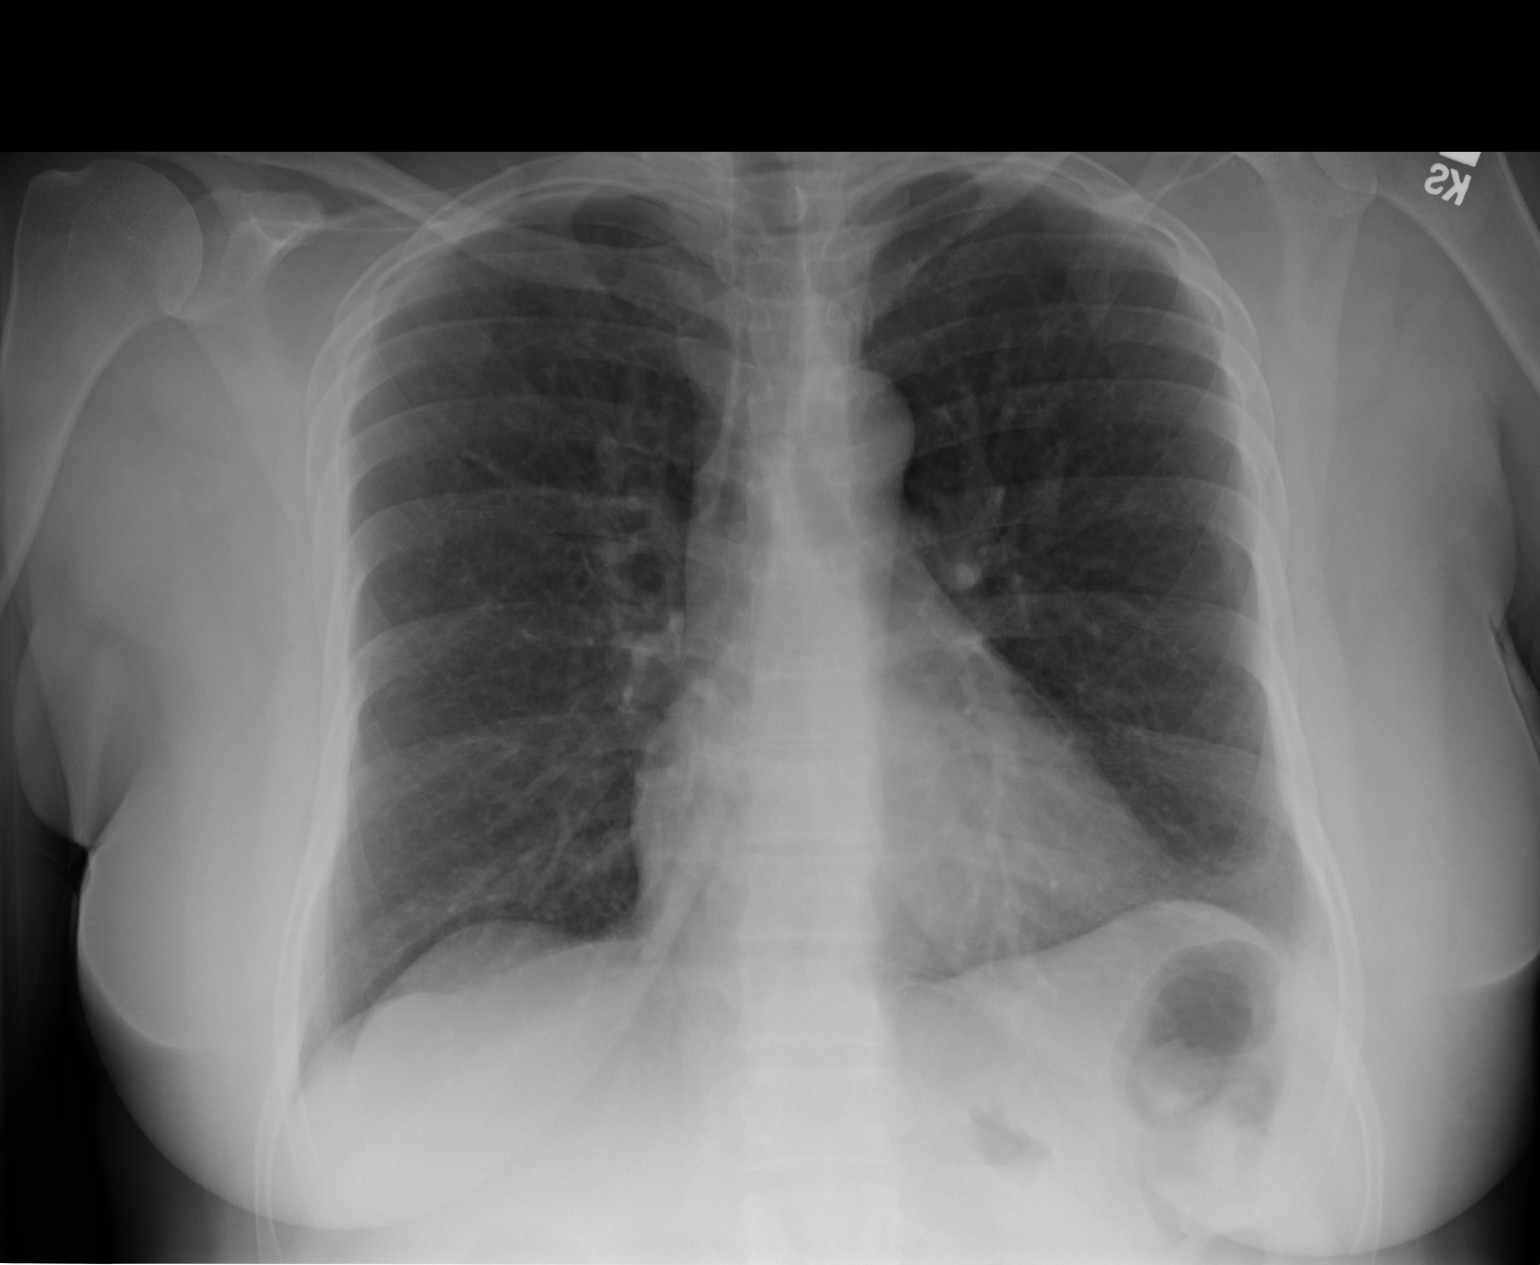
[im 2/2]
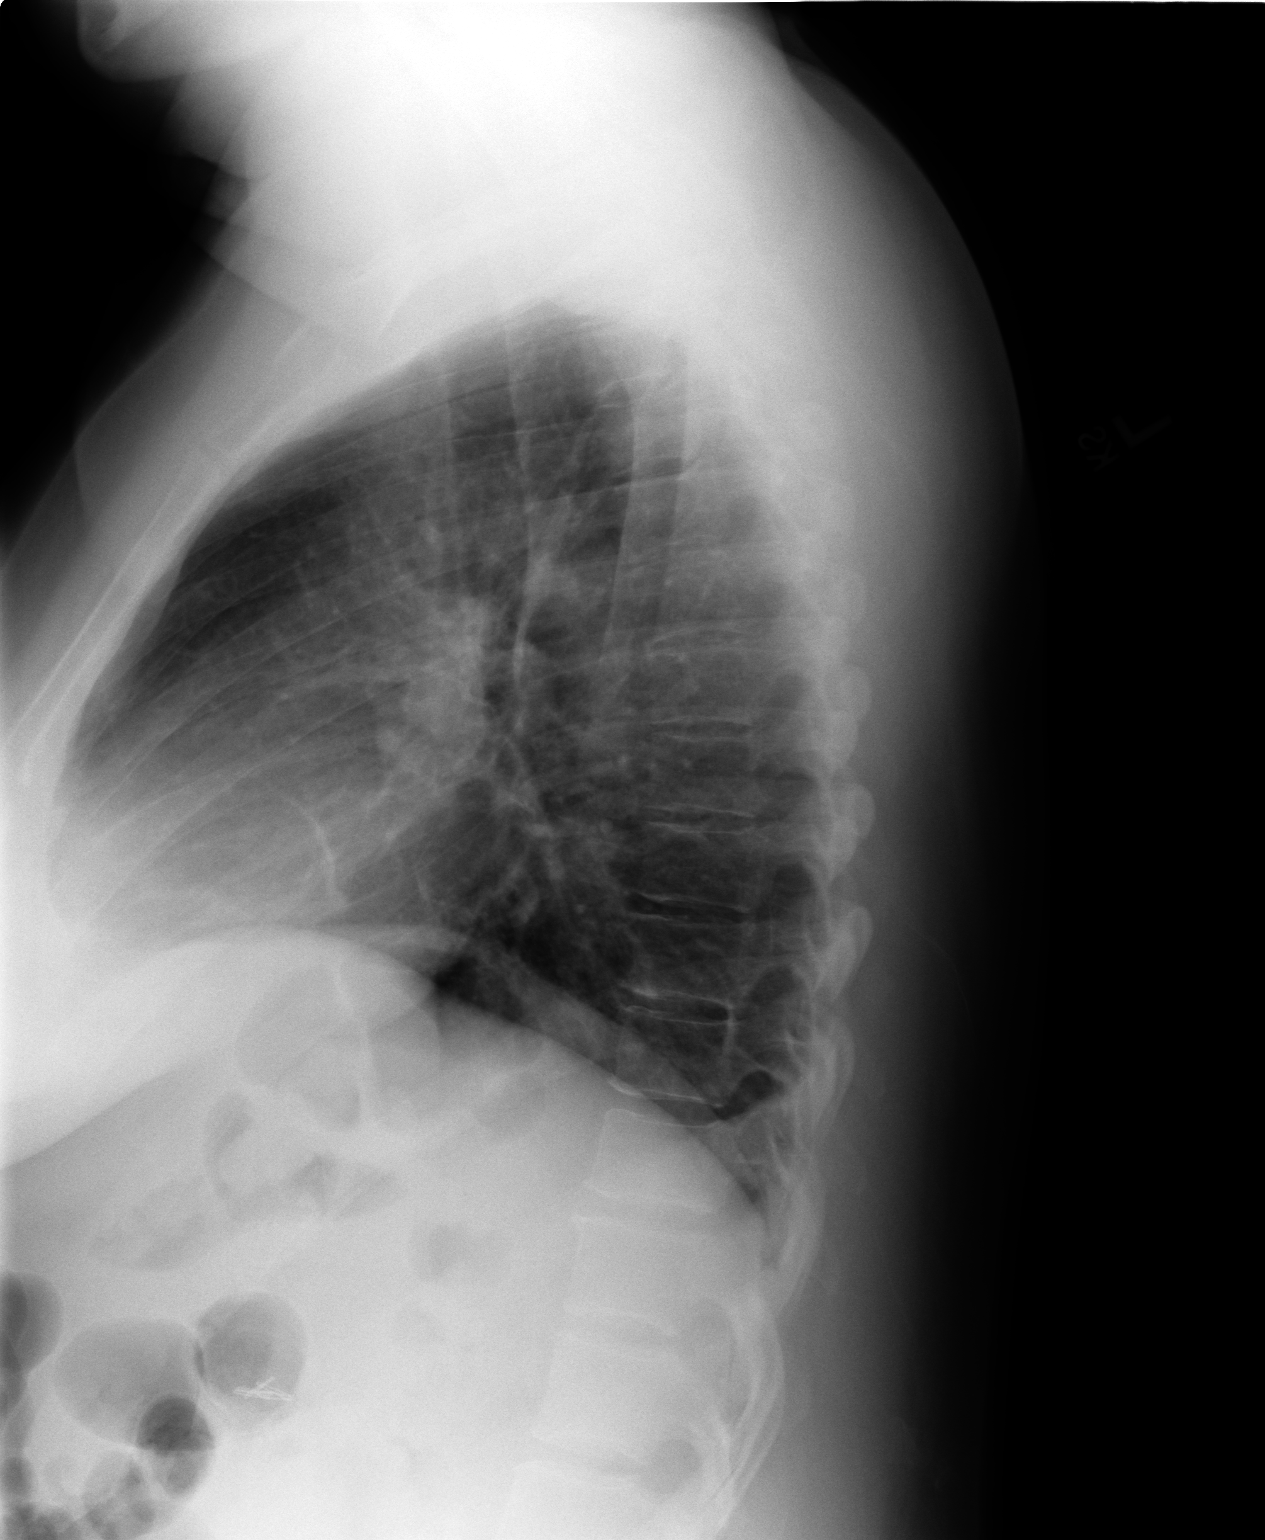

[2 of 2 positions shown; findings below may reference images not displayed]

IMPRESSION: Mild left base subsegmental atelectasis.

## 2010-09-20 ENCOUNTER — Emergency Department: Payer: Self-pay | Admitting: Emergency Medicine

## 2010-10-25 ENCOUNTER — Emergency Department: Payer: Self-pay | Admitting: Emergency Medicine

## 2011-01-24 ENCOUNTER — Ambulatory Visit: Payer: Self-pay | Admitting: Internal Medicine

## 2011-02-12 ENCOUNTER — Other Ambulatory Visit: Payer: Self-pay | Admitting: Internal Medicine

## 2011-02-12 ENCOUNTER — Emergency Department: Payer: Self-pay | Admitting: Emergency Medicine

## 2011-03-01 IMAGING — CR DG CHEST 2V
1 series · 2 of 2 positions shown · non-contrast
Comparison: none

REASON FOR EXAM: sob
COMMENTS:

[Series 1: view not recorded · 0.17mm/px · 2 of 2 slices shown]
[im 1/2]
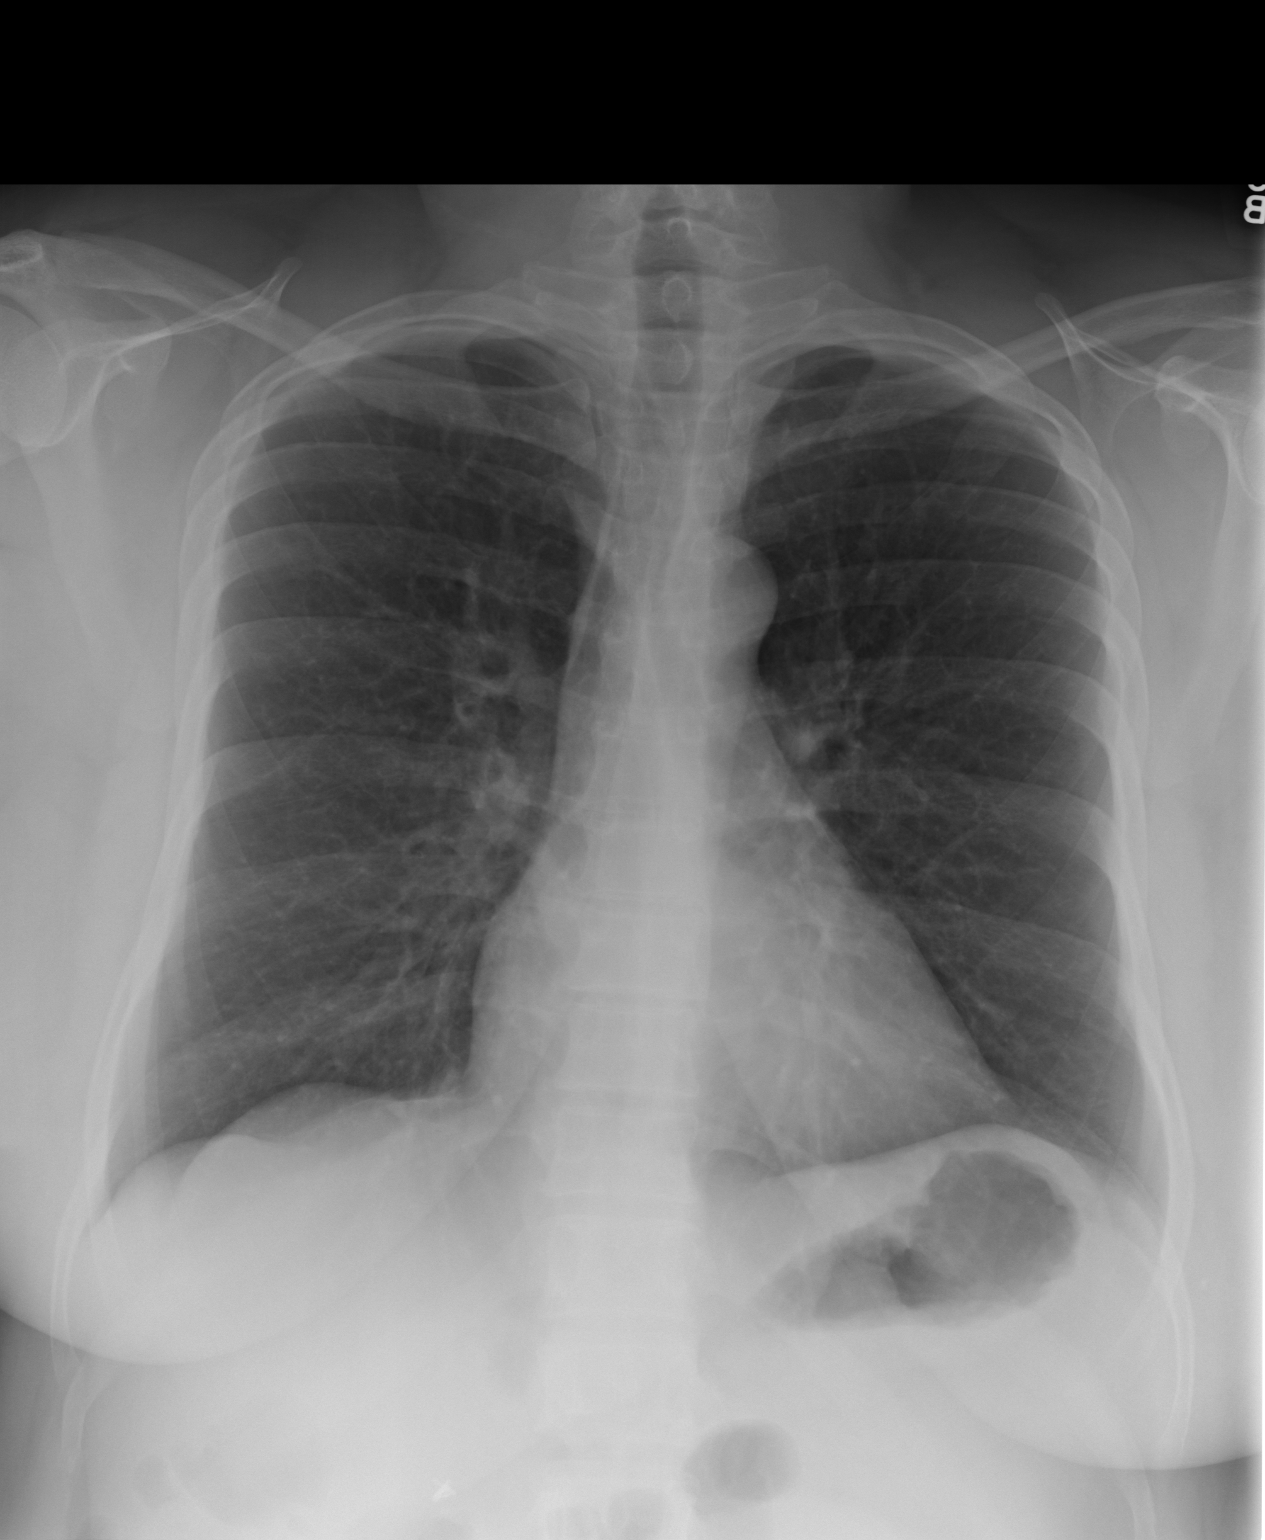
[im 2/2]
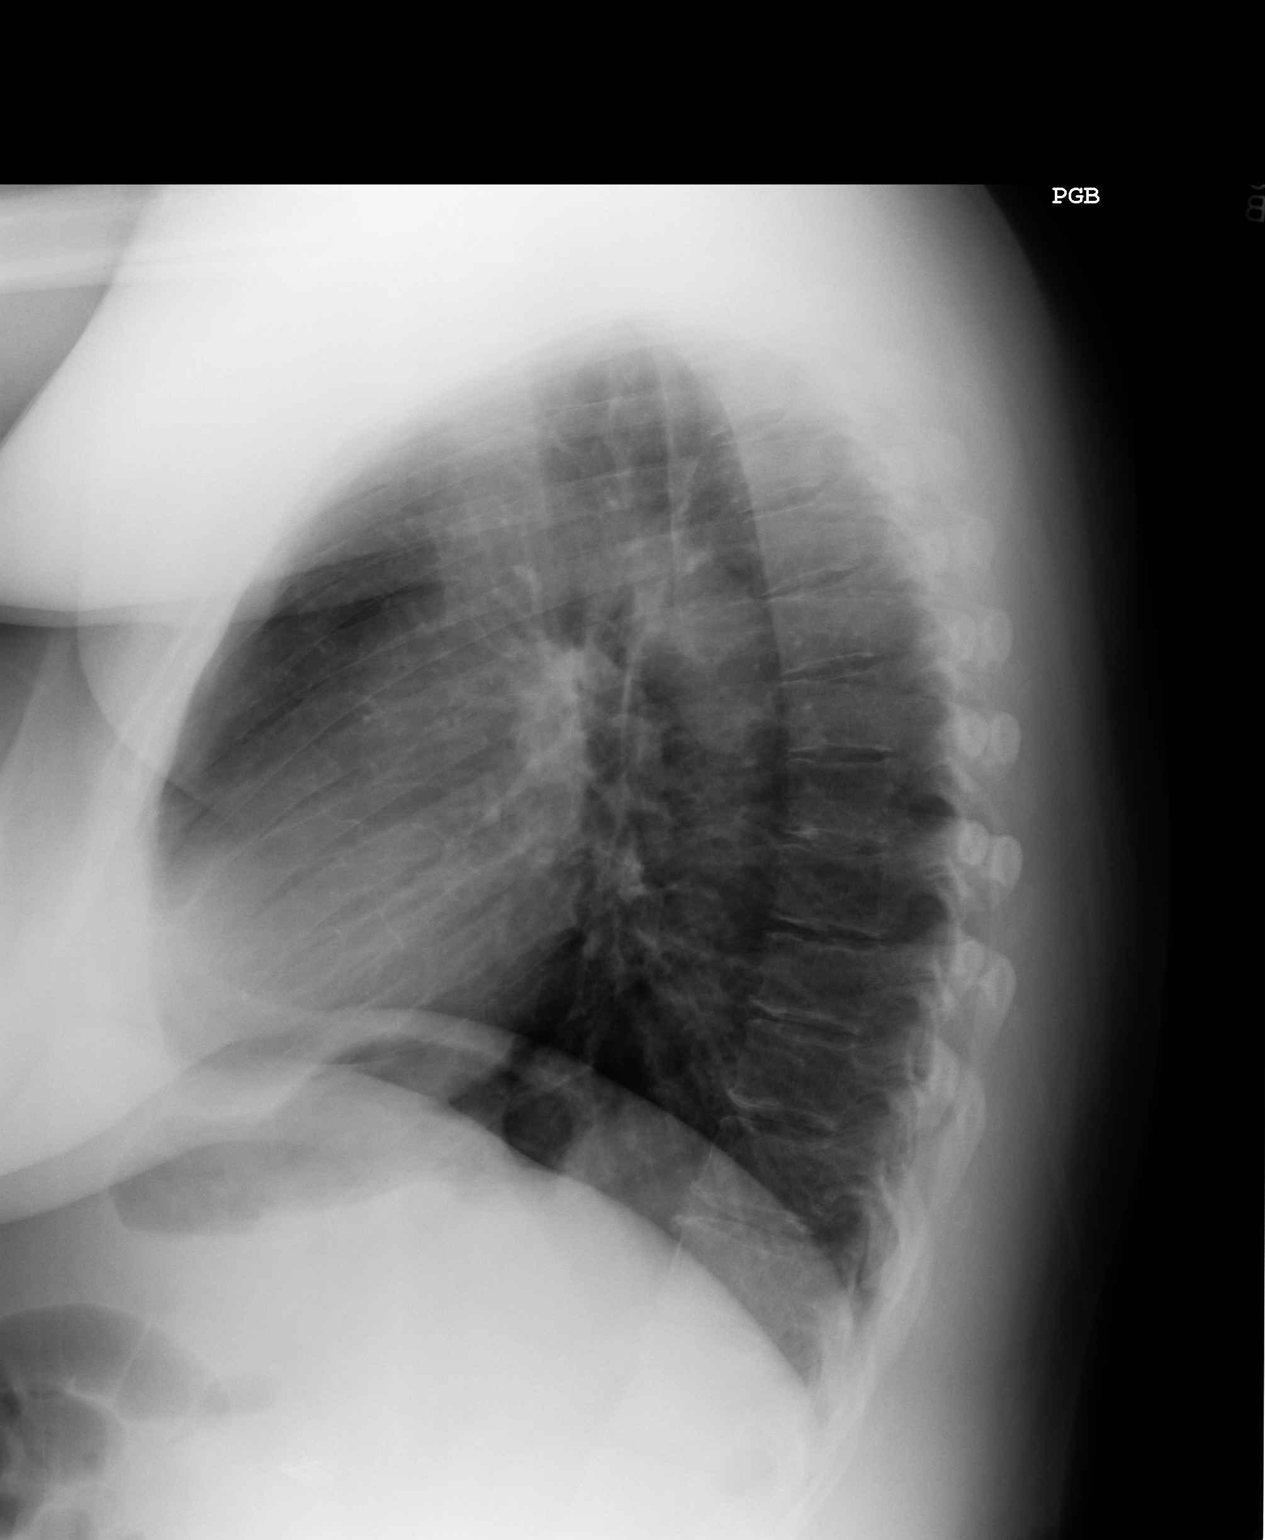

[2 of 2 positions shown; findings below may reference images not displayed]

PROCEDURE:     DXR - DXR CHEST PA (OR AP) AND LATERAL  - October 30, 2009  [DATE]

RESULT:     The lungs are mildly hyperinflated. The interstitial markings
are minimally prominent. The cardiac silhouette is normal in size. The
pulmonary vascularity is not engorged. There is no pleural effusion. The
bony thorax appears intact.
IMPRESSION: The findings are consistent with COPD or reactive airway
disease. I cannot exclude superimposed acute bronchitis. There is no
evidence of pneumonia nor CHF.

## 2011-03-14 IMAGING — CR DG CHEST 1V PORT
1 series · 1 of 1 positions shown · non-contrast
Comparison: none

REASON FOR EXAM: shortness of breath, tachypneic and hypoxic
COMMENTS:

PROCEDURE:     DXR - DXR PORTABLE CHEST SINGLE VIEW  - November 12, 2009  [DATE]
RESULT:     The lung fields are clear. No pneumonia, pneumothorax or pleural
effusion is seen. Heart size is normal. The mediastinal and osseous
structures show no acute changes.

[view not recorded]
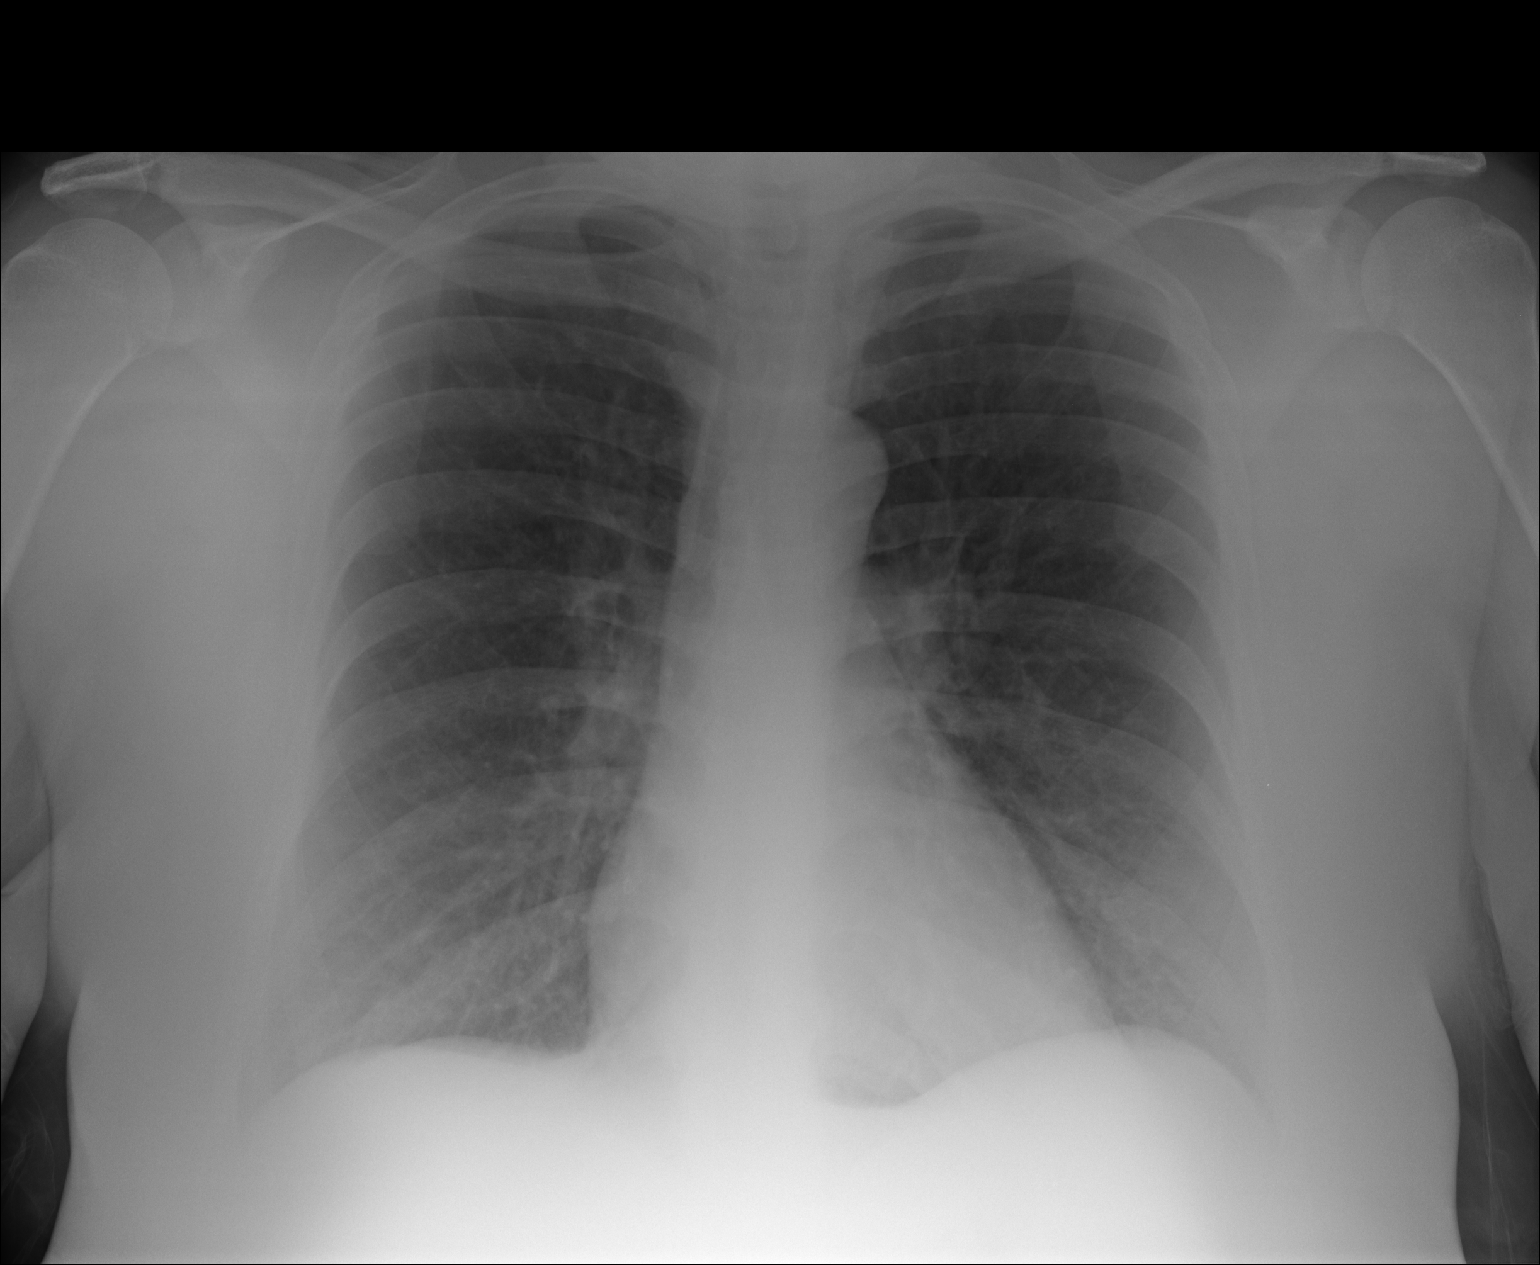

[1 of 1 positions shown; findings below may reference images not displayed]

IMPRESSION: 1.     No acute changes are identified.

## 2011-03-22 IMAGING — CR DG CHEST 1V PORT
1 series · 1 of 1 positions shown · non-contrast
Comparison: none

REASON FOR EXAM: sob
COMMENTS:

PROCEDURE:     DXR - DXR PORTABLE CHEST SINGLE VIEW  - November 20, 2009 [DATE]
RESULT:     The lungs are clear. The cardiac silhouette and visualized bony
skeleton are unremarkable.

[view not recorded]
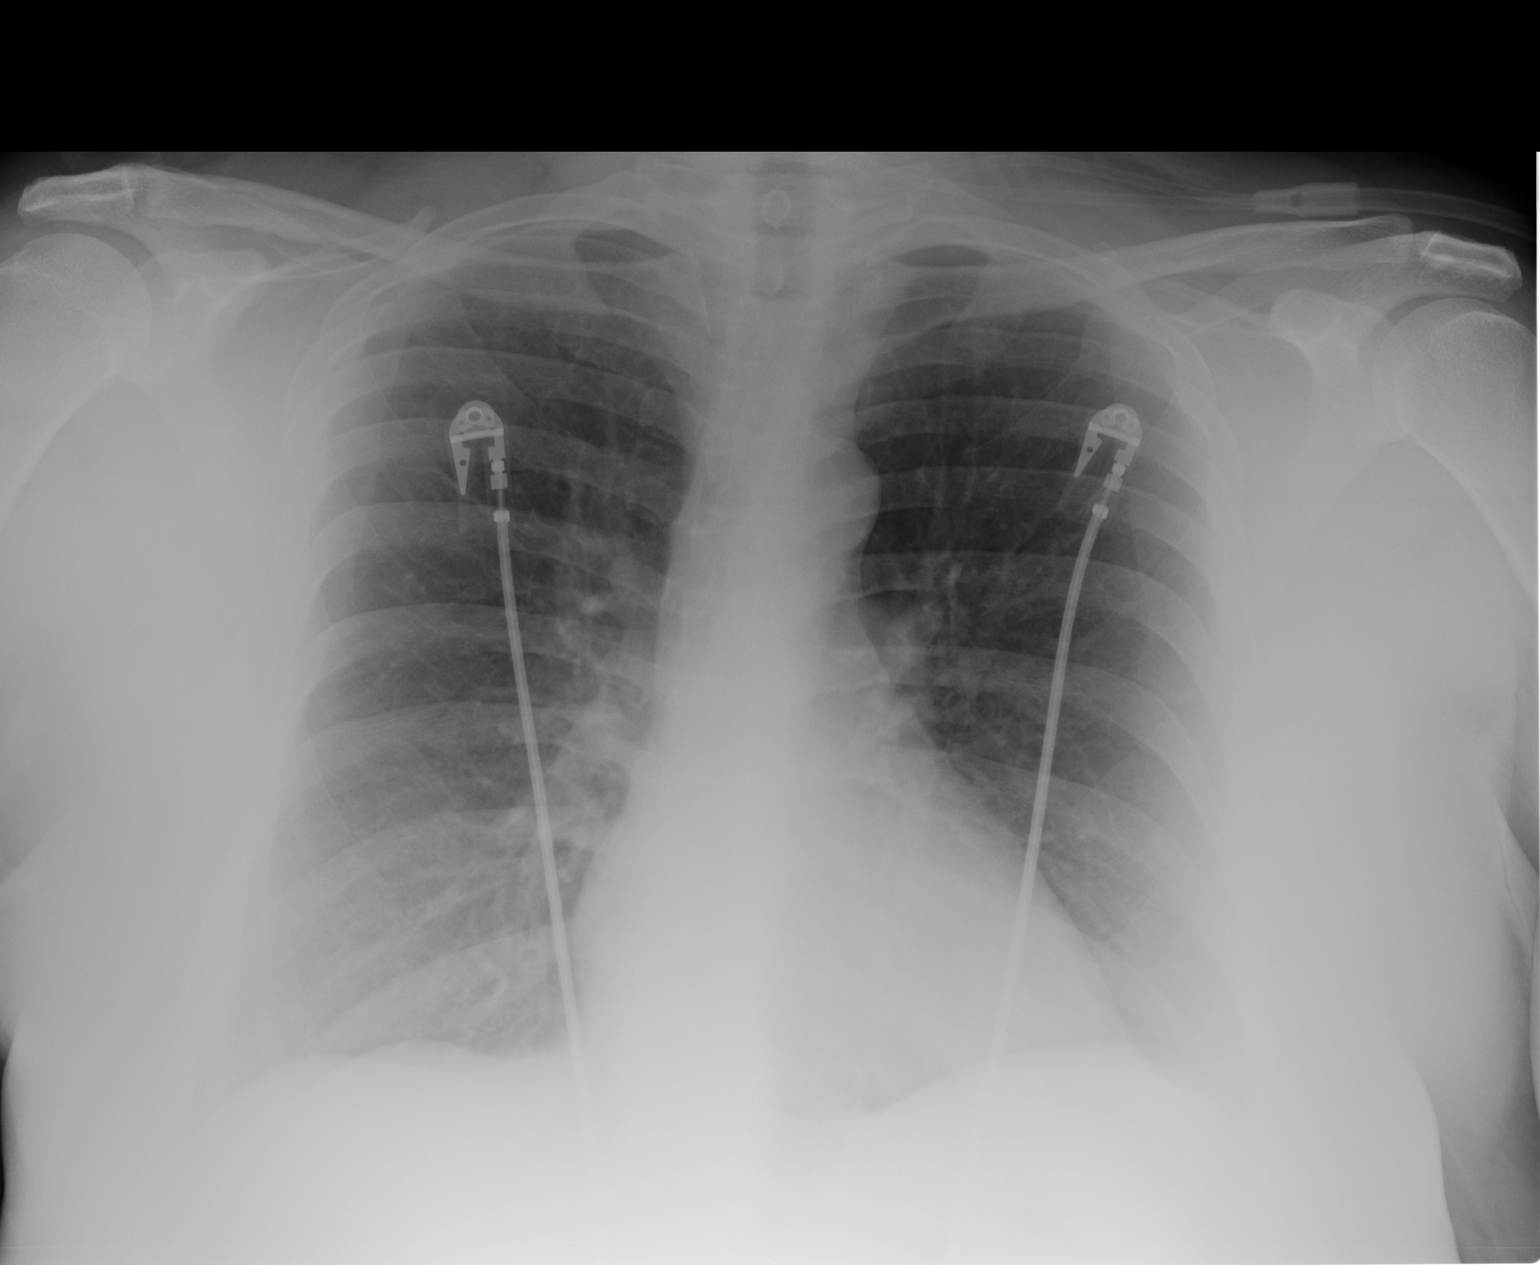

[1 of 1 positions shown; findings below may reference images not displayed]

IMPRESSION: 1. Chest radiograph without evidence of acute cardiopulmonary disease.
2. Comparison made to a prior study dated 11/12/2009.

## 2011-04-01 ENCOUNTER — Emergency Department: Payer: Self-pay | Admitting: Emergency Medicine

## 2011-04-16 IMAGING — CR DG CHEST 2V
1 series · 2 of 2 positions shown · non-contrast
Comparison: none

REASON FOR EXAM: Shortness of breath with wheezing
COMMENTS:   May transport without cardiac monitor

[Series 1: view not recorded · 0.17mm/px · 2 of 2 slices shown]
[im 1/2]
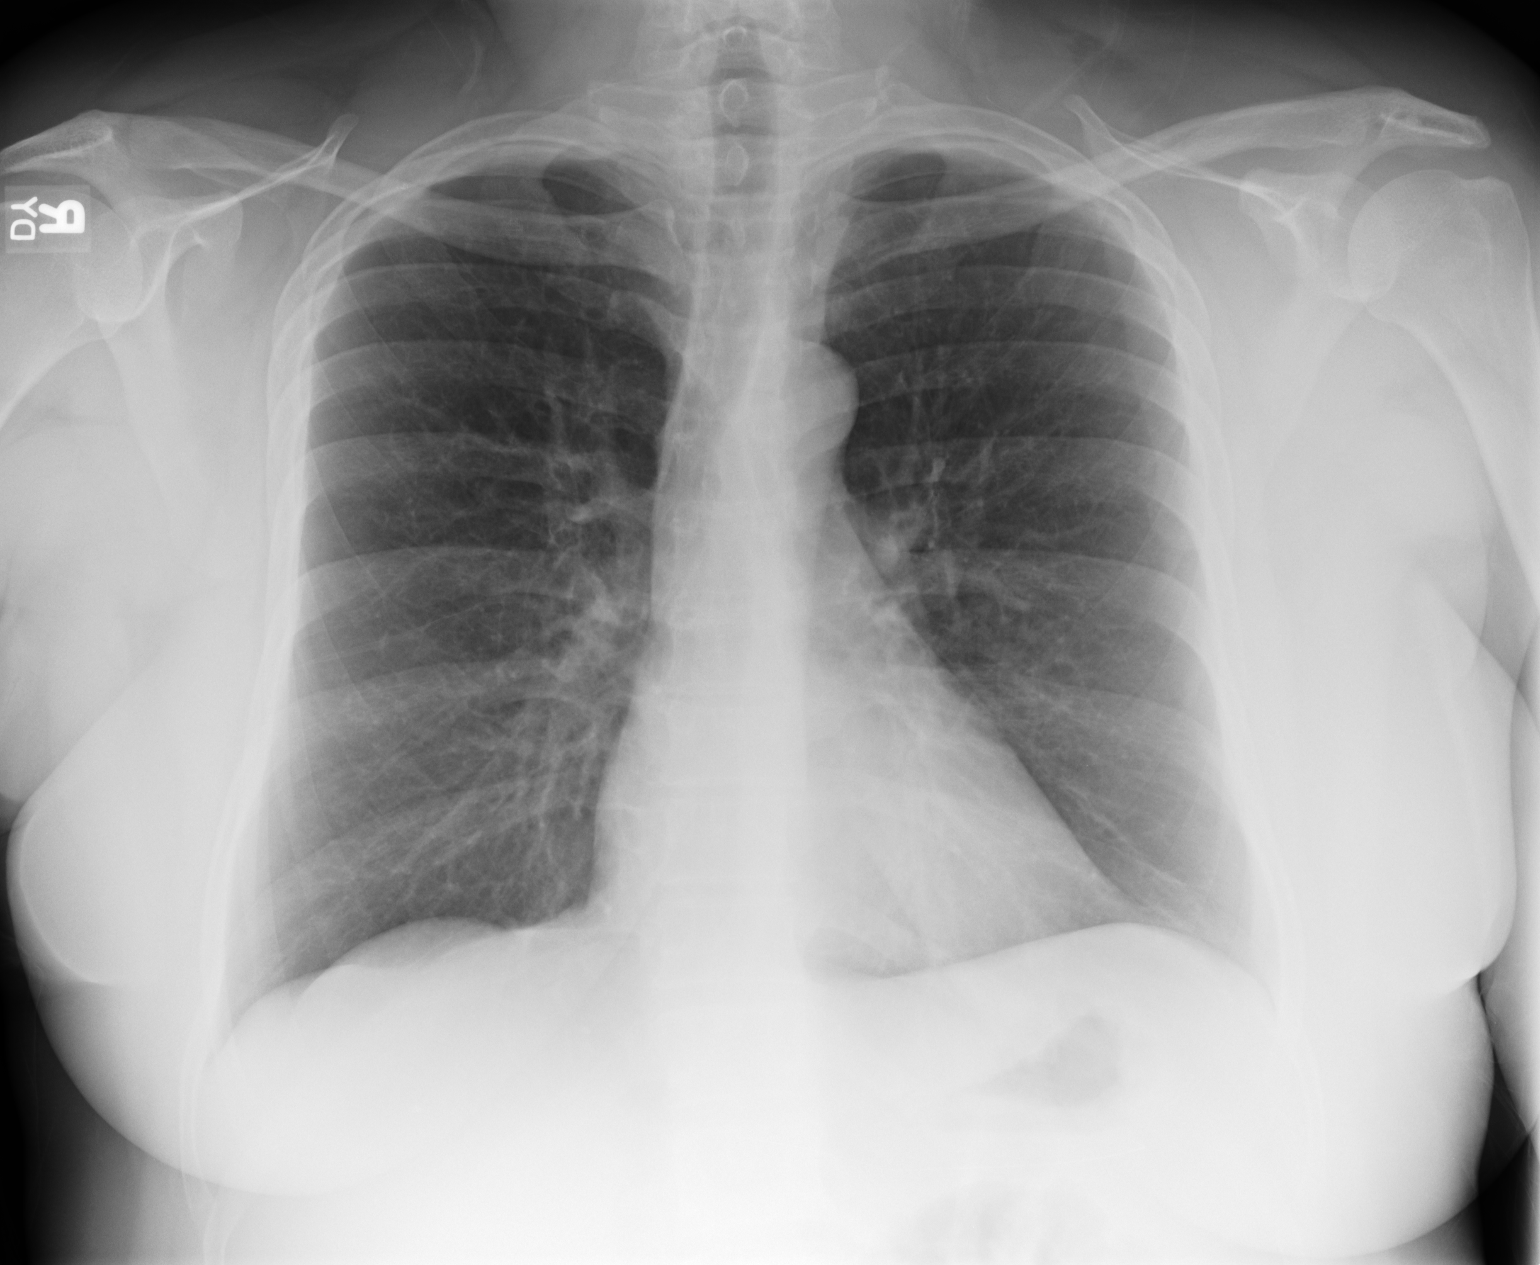
[im 2/2]
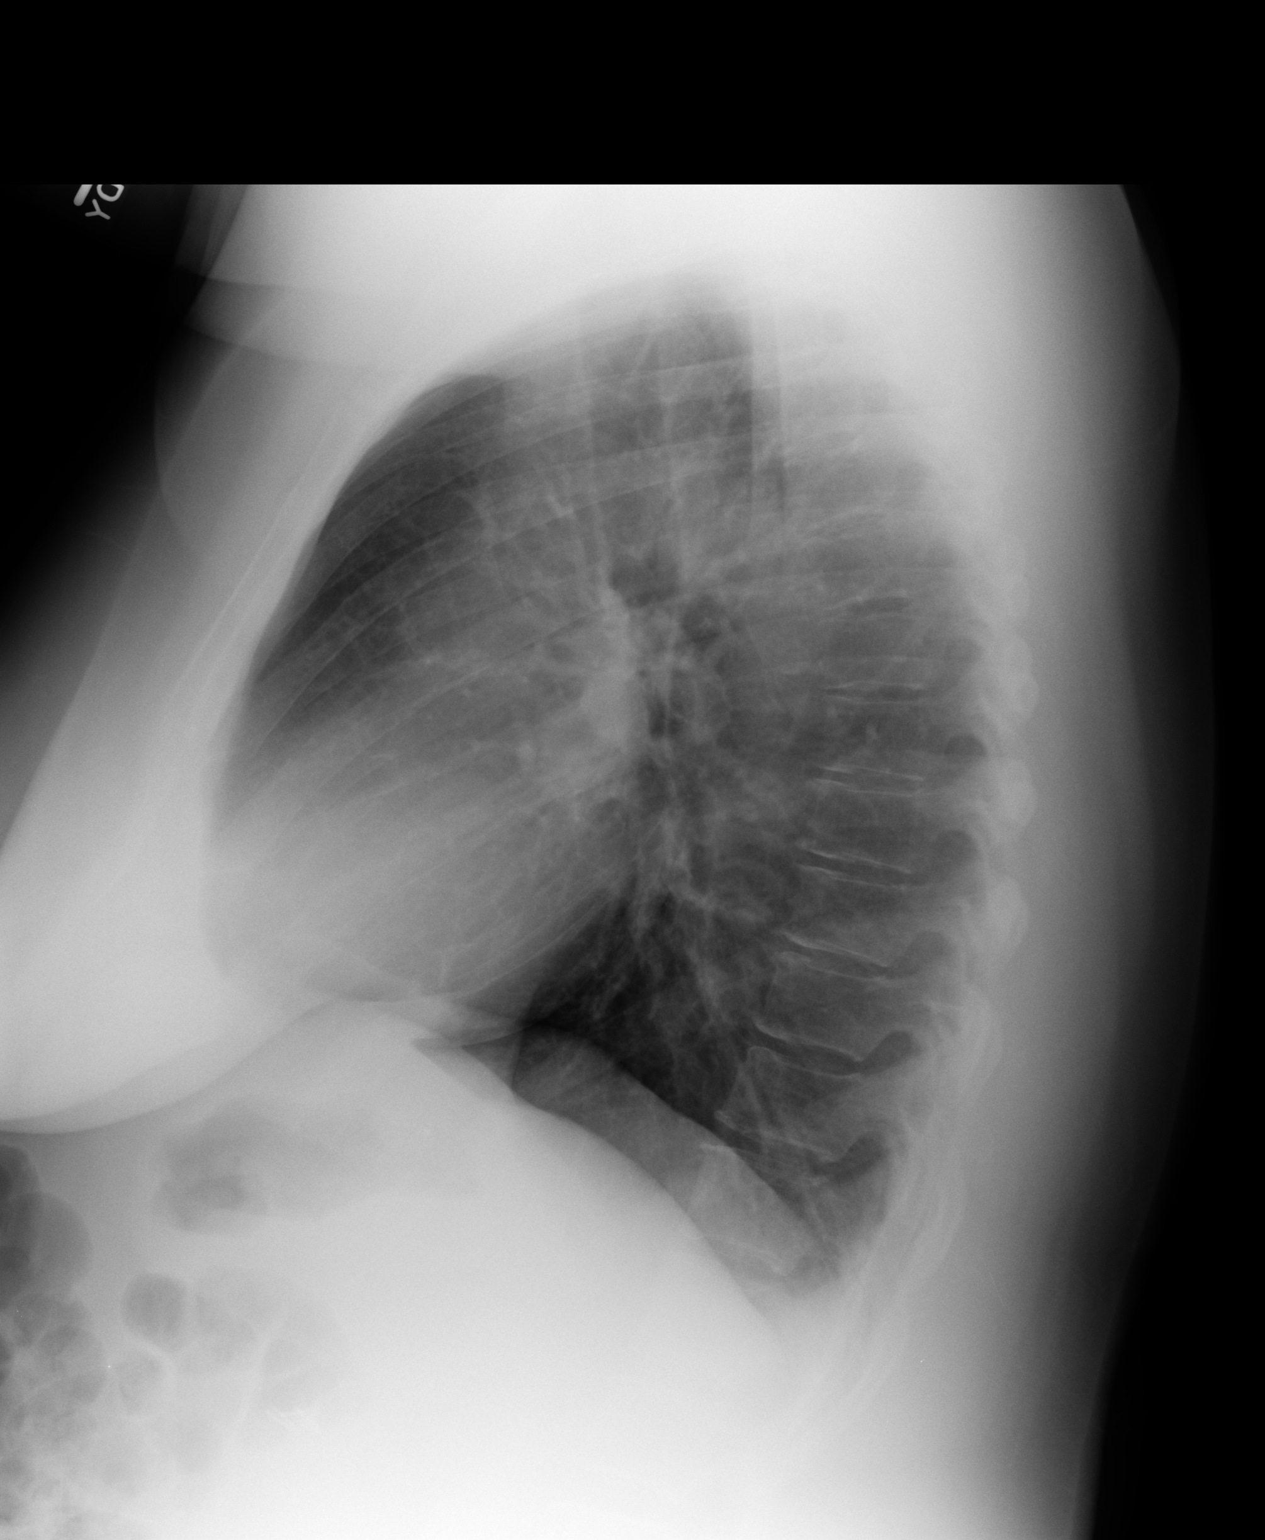

[2 of 2 positions shown; findings below may reference images not displayed]

PROCEDURE:     DXR - DXR CHEST PA (OR AP) AND LATERAL  - December 15, 2009 [DATE]

RESULT:     Comparison is made to the prior exam of 11-20-09. The lung
fields are clear. No pneumonia, pneumothorax or pleural effusion is seen.
Heart size is normal. The mediastinal and osseous structures show no
significant abnormalities.
IMPRESSION: 1.     No acute changes are identified.

## 2011-05-11 ENCOUNTER — Emergency Department: Payer: Self-pay | Admitting: Emergency Medicine

## 2011-06-19 IMAGING — CR DG CHEST 1V PORT
1 series · 1 of 1 positions shown · non-contrast
Comparison: none

REASON FOR EXAM: shortness of breath
COMMENTS:

PROCEDURE:     DXR - DXR PORTABLE CHEST SINGLE VIEW  - February 17, 2010  [DATE]
RESULT:     Portable AP view of the chest is compared to a prior exam of
12/15/2009. The lung fields are clear. The heart, mediastinal and osseous
structures show no acute changes. Monitoring electrodes are present.

[view not recorded]
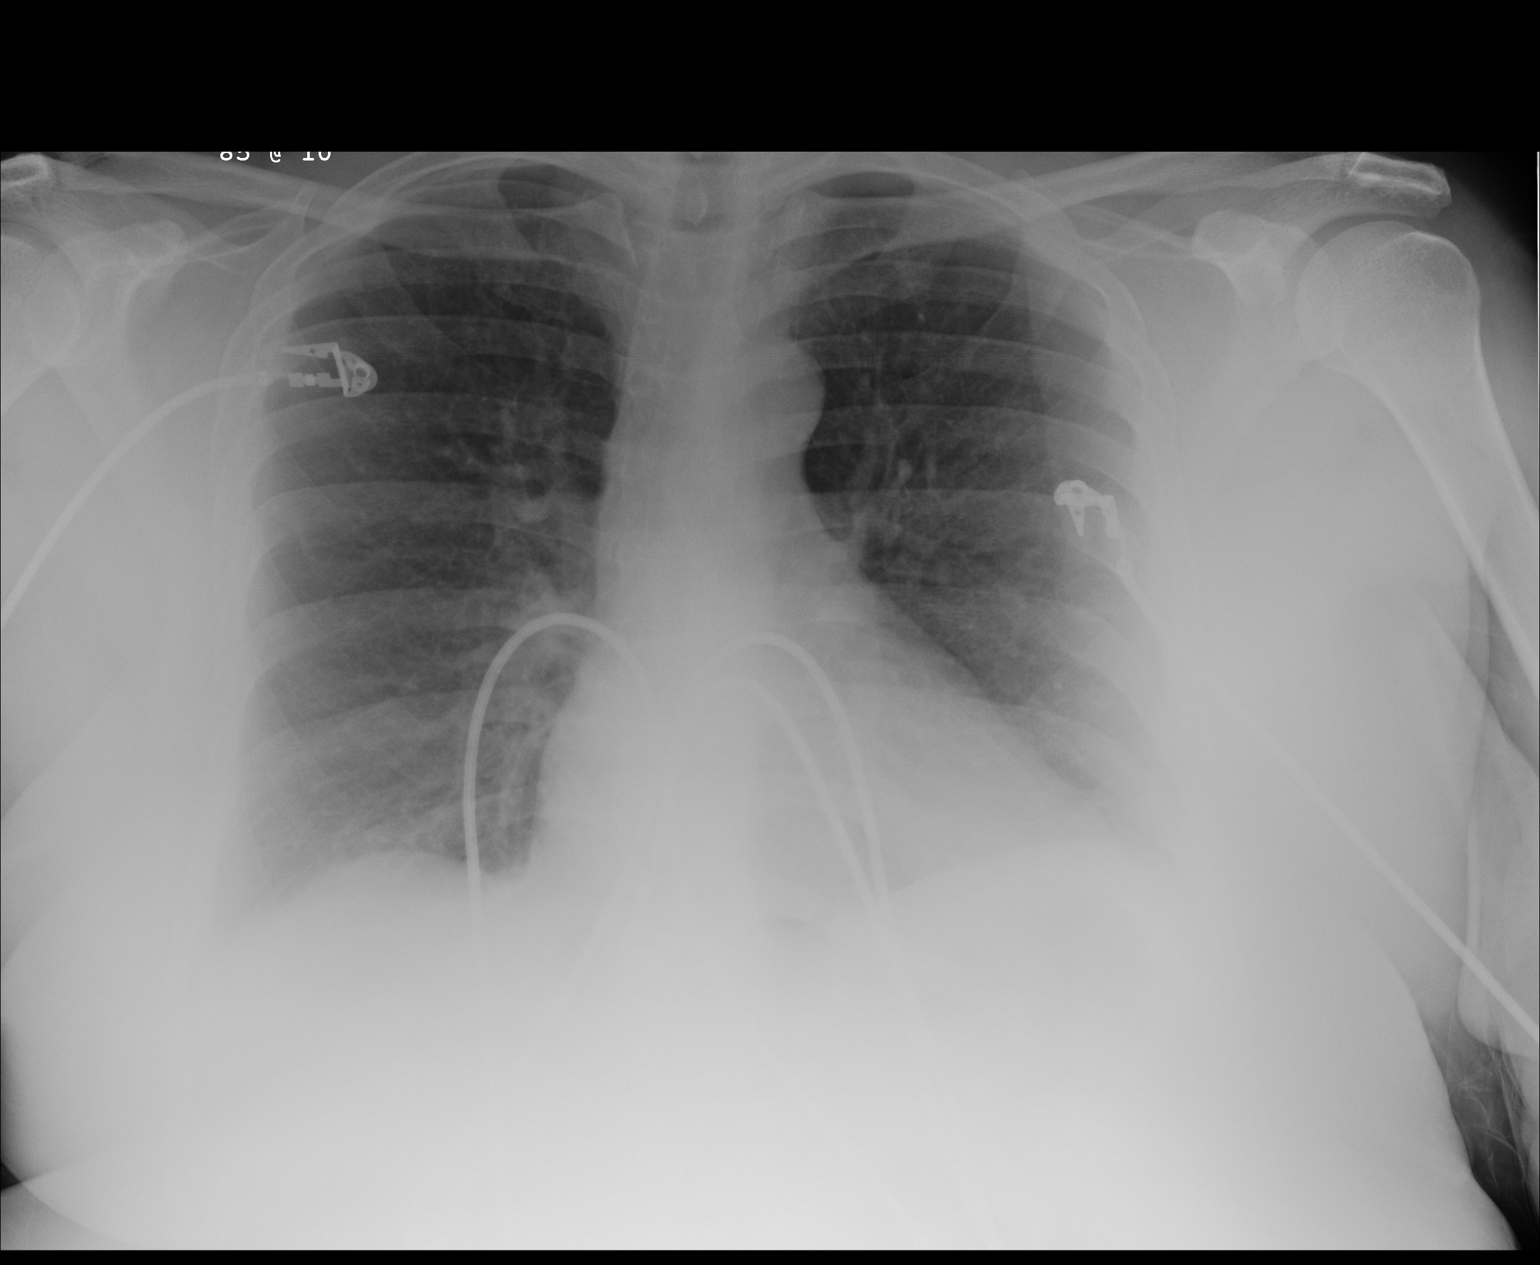

[1 of 1 positions shown; findings below may reference images not displayed]

IMPRESSION: 1.     No acute changes are identified.

## 2011-07-07 IMAGING — CR DG CHEST 2V
1 series · 2 of 2 positions shown · non-contrast
Comparison: none

REASON FOR EXAM: sob
COMMENTS:

PROCEDURE:     DXR - DXR CHEST PA (OR AP) AND LATERAL  - March 07, 2010 [DATE]
RESULT:     The lungs are clear. The cardiac silhouette and visualized bony
skeleton are unremarkable.

[Series 1: view not recorded · 0.17mm/px · 2 of 2 slices shown]
[im 1/2]
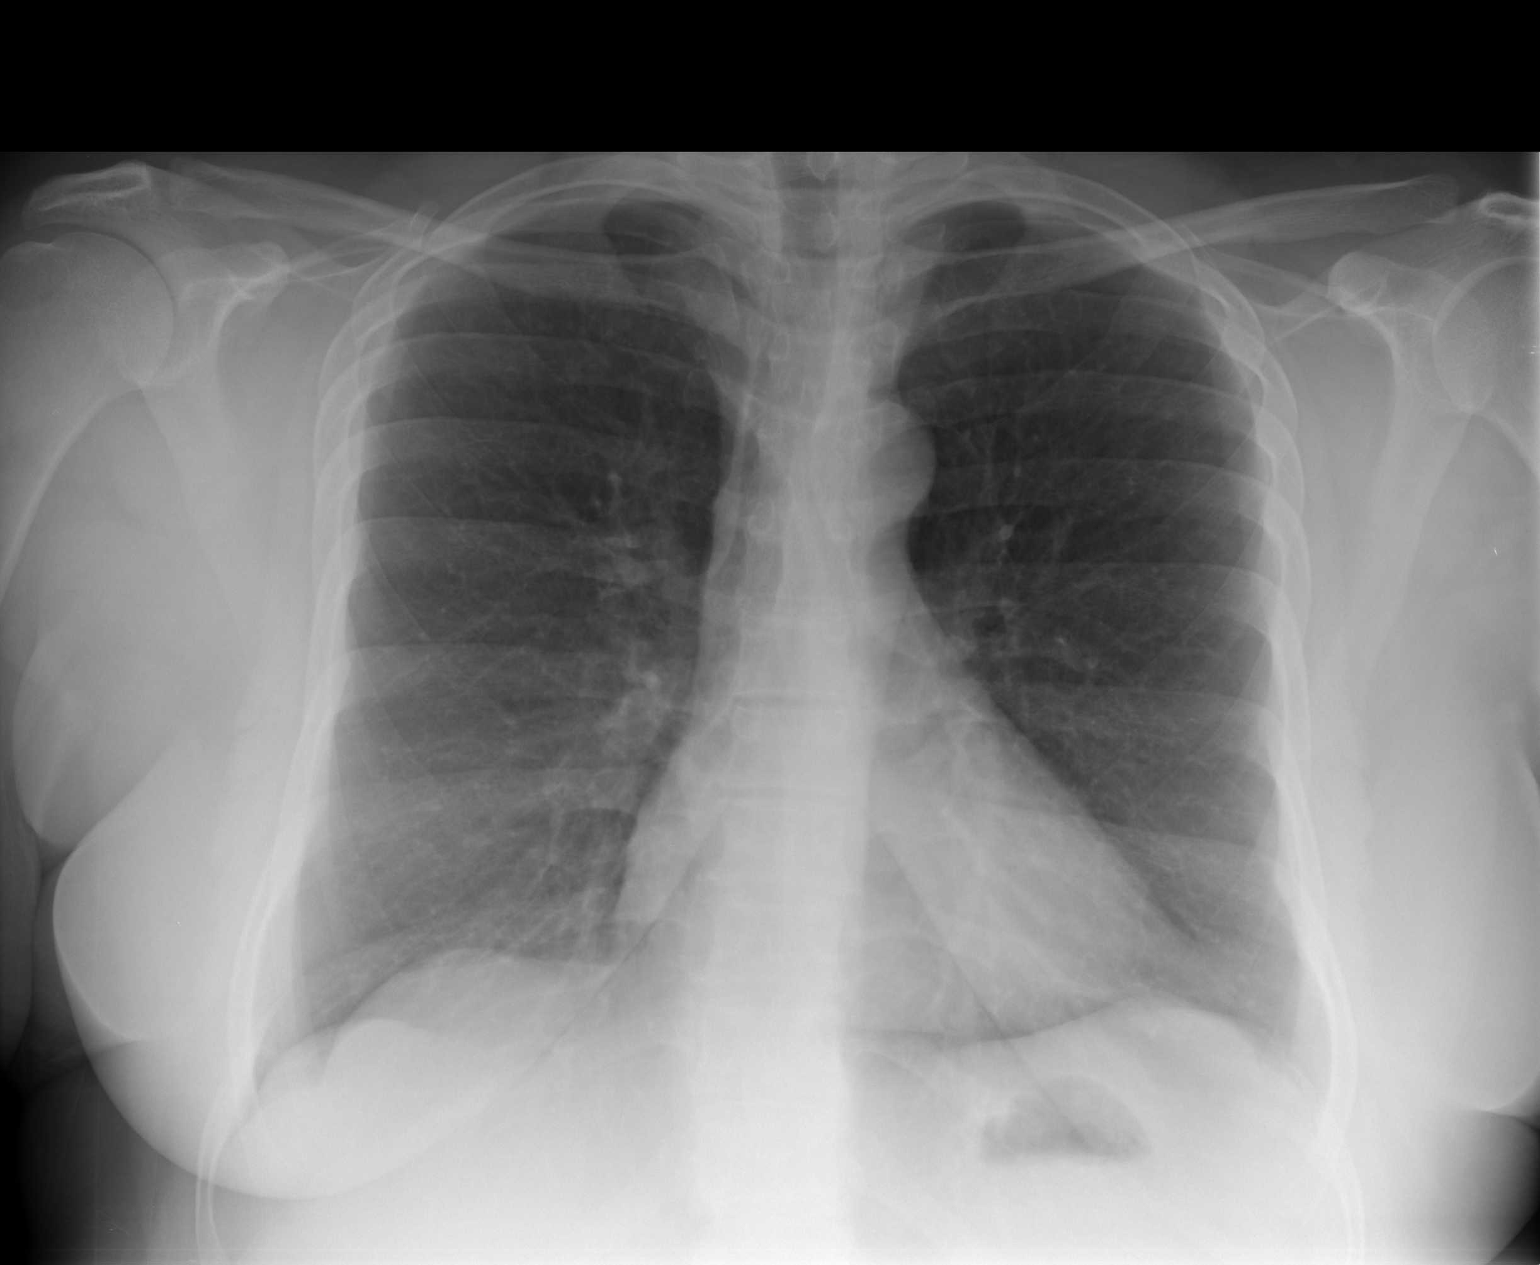
[im 2/2]
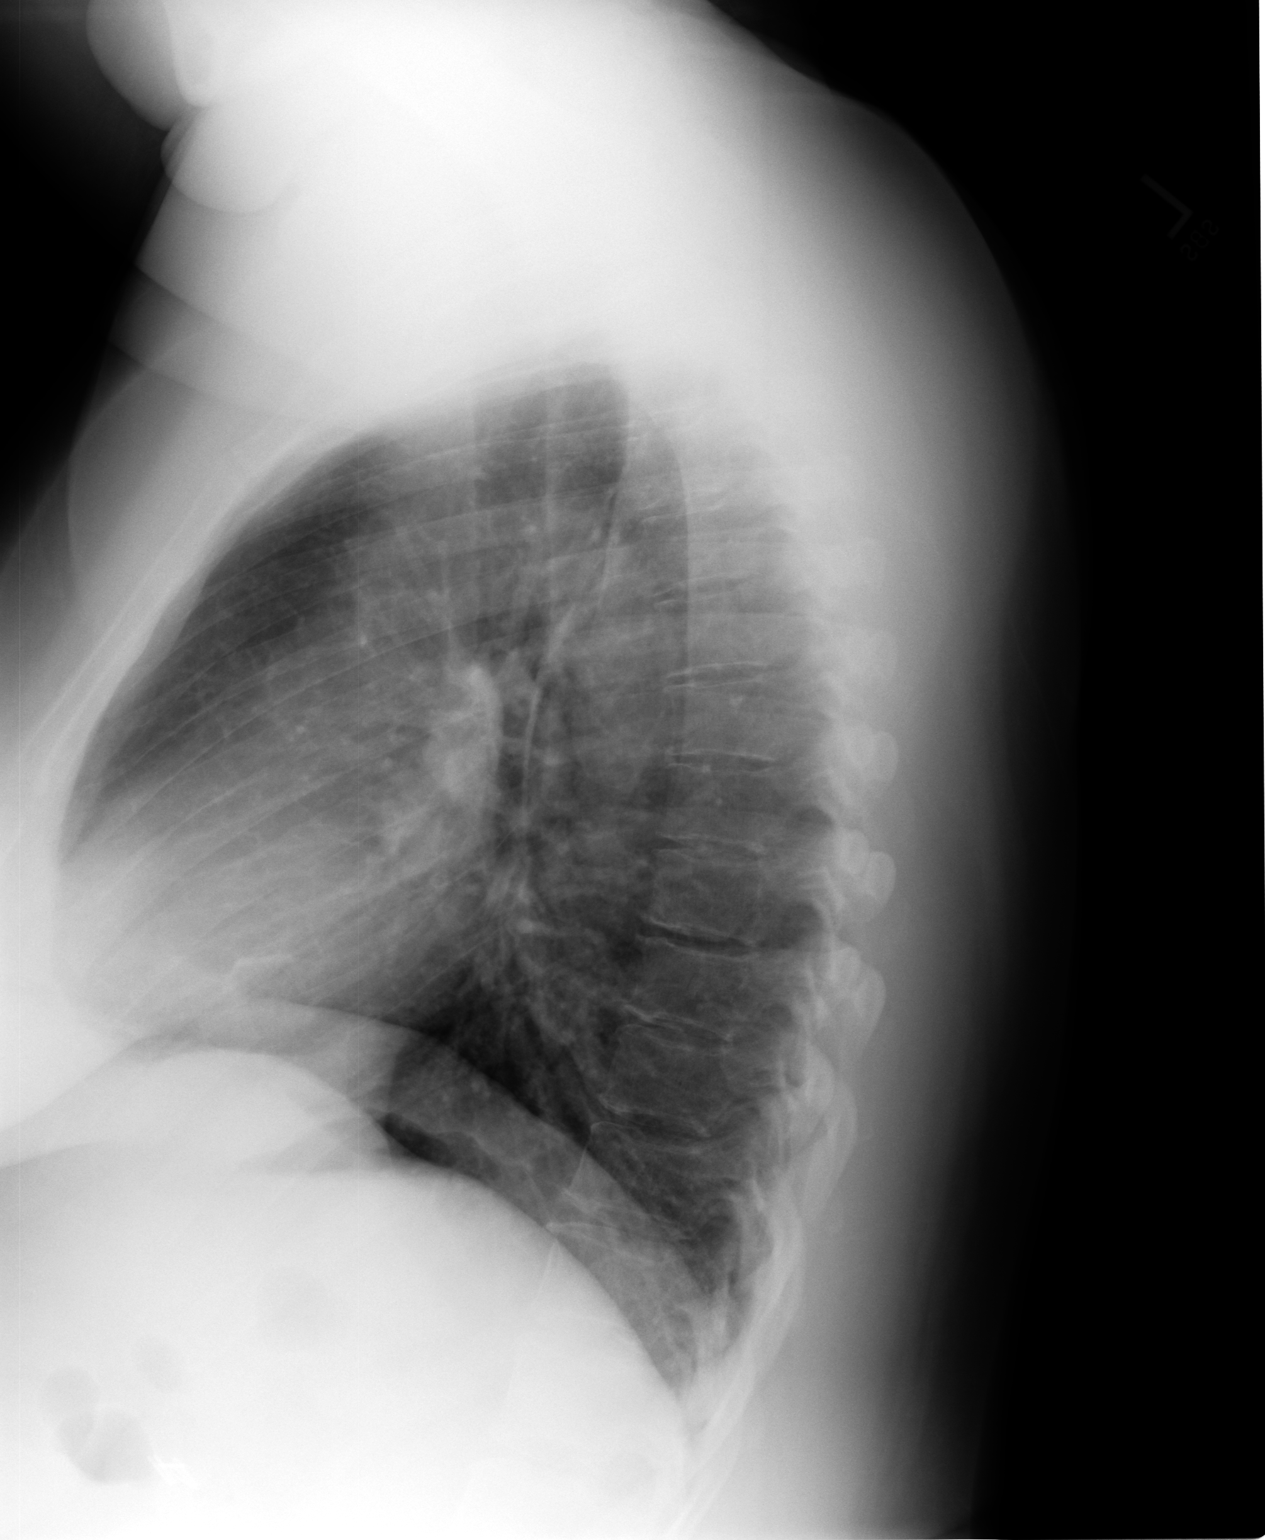

[2 of 2 positions shown; findings below may reference images not displayed]

IMPRESSION: 1. Chest radiograph without evidence of acute cardiopulmonary disease.

## 2011-07-22 ENCOUNTER — Emergency Department: Payer: Self-pay | Admitting: Unknown Physician Specialty

## 2011-08-03 ENCOUNTER — Emergency Department: Payer: Self-pay | Admitting: *Deleted

## 2011-08-17 IMAGING — CR DG CHEST 1V PORT
1 series · 1 of 1 positions shown · non-contrast
Comparison: none

REASON FOR EXAM: dyspnea - asthma attack
COMMENTS:

PROCEDURE:     DXR - DXR PORTABLE CHEST SINGLE VIEW  - April 17, 2010  [DATE]
RESULT:     The lungs are clear. The cardiac silhouette and visualized bony
skeleton are unremarkable.

[view not recorded]
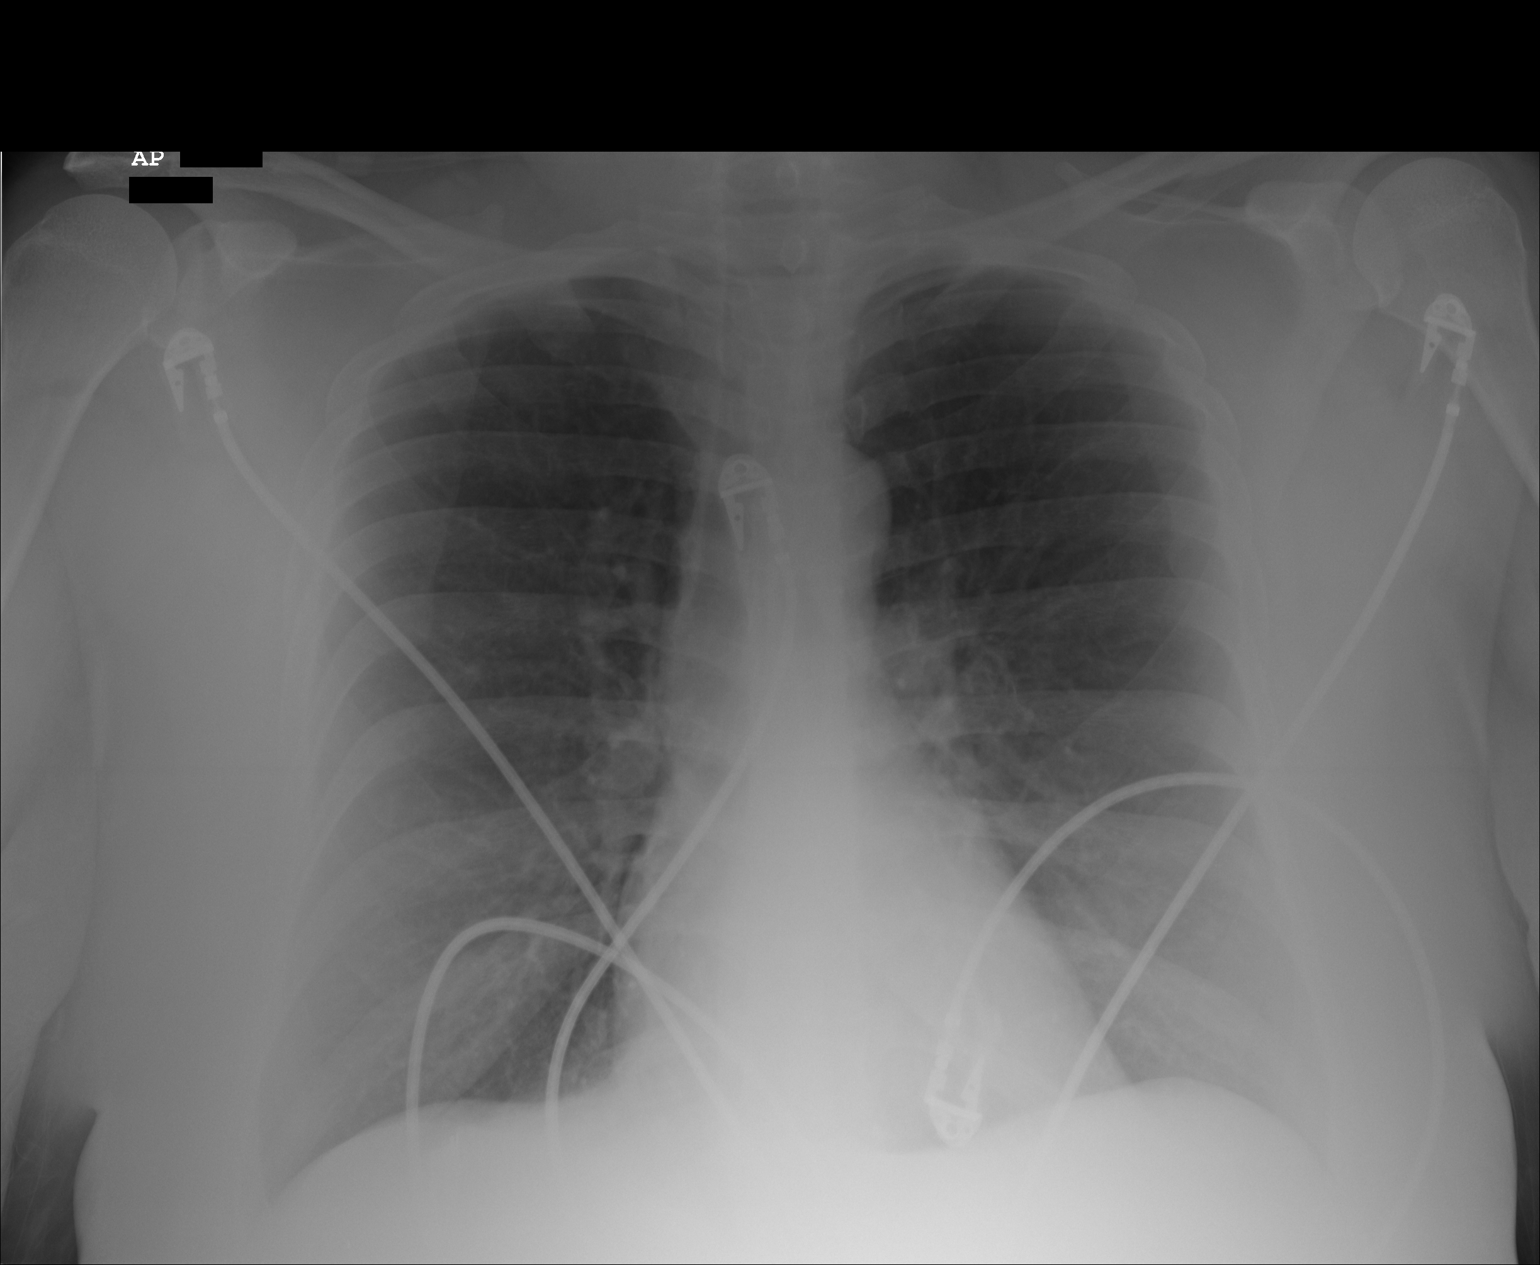

[1 of 1 positions shown; findings below may reference images not displayed]

IMPRESSION: 1. Chest radiograph without evidence of acute cardiopulmonary disease.
2. Comparison made to prior study dated 03/07/2010.

## 2011-09-27 ENCOUNTER — Emergency Department: Payer: Self-pay | Admitting: Emergency Medicine

## 2011-09-29 IMAGING — CR DG CHEST 1V PORT
1 series · 1 of 1 positions shown · non-contrast
Comparison: none

REASON FOR EXAM: dyspnea
COMMENTS:

PROCEDURE:     DXR - DXR PORTABLE CHEST SINGLE VIEW  - May 30, 2010  [DATE]
RESULT:     The lungs are clear. The cardiac silhouette and visualized bony
skeleton are unremarkable.

[view not recorded]
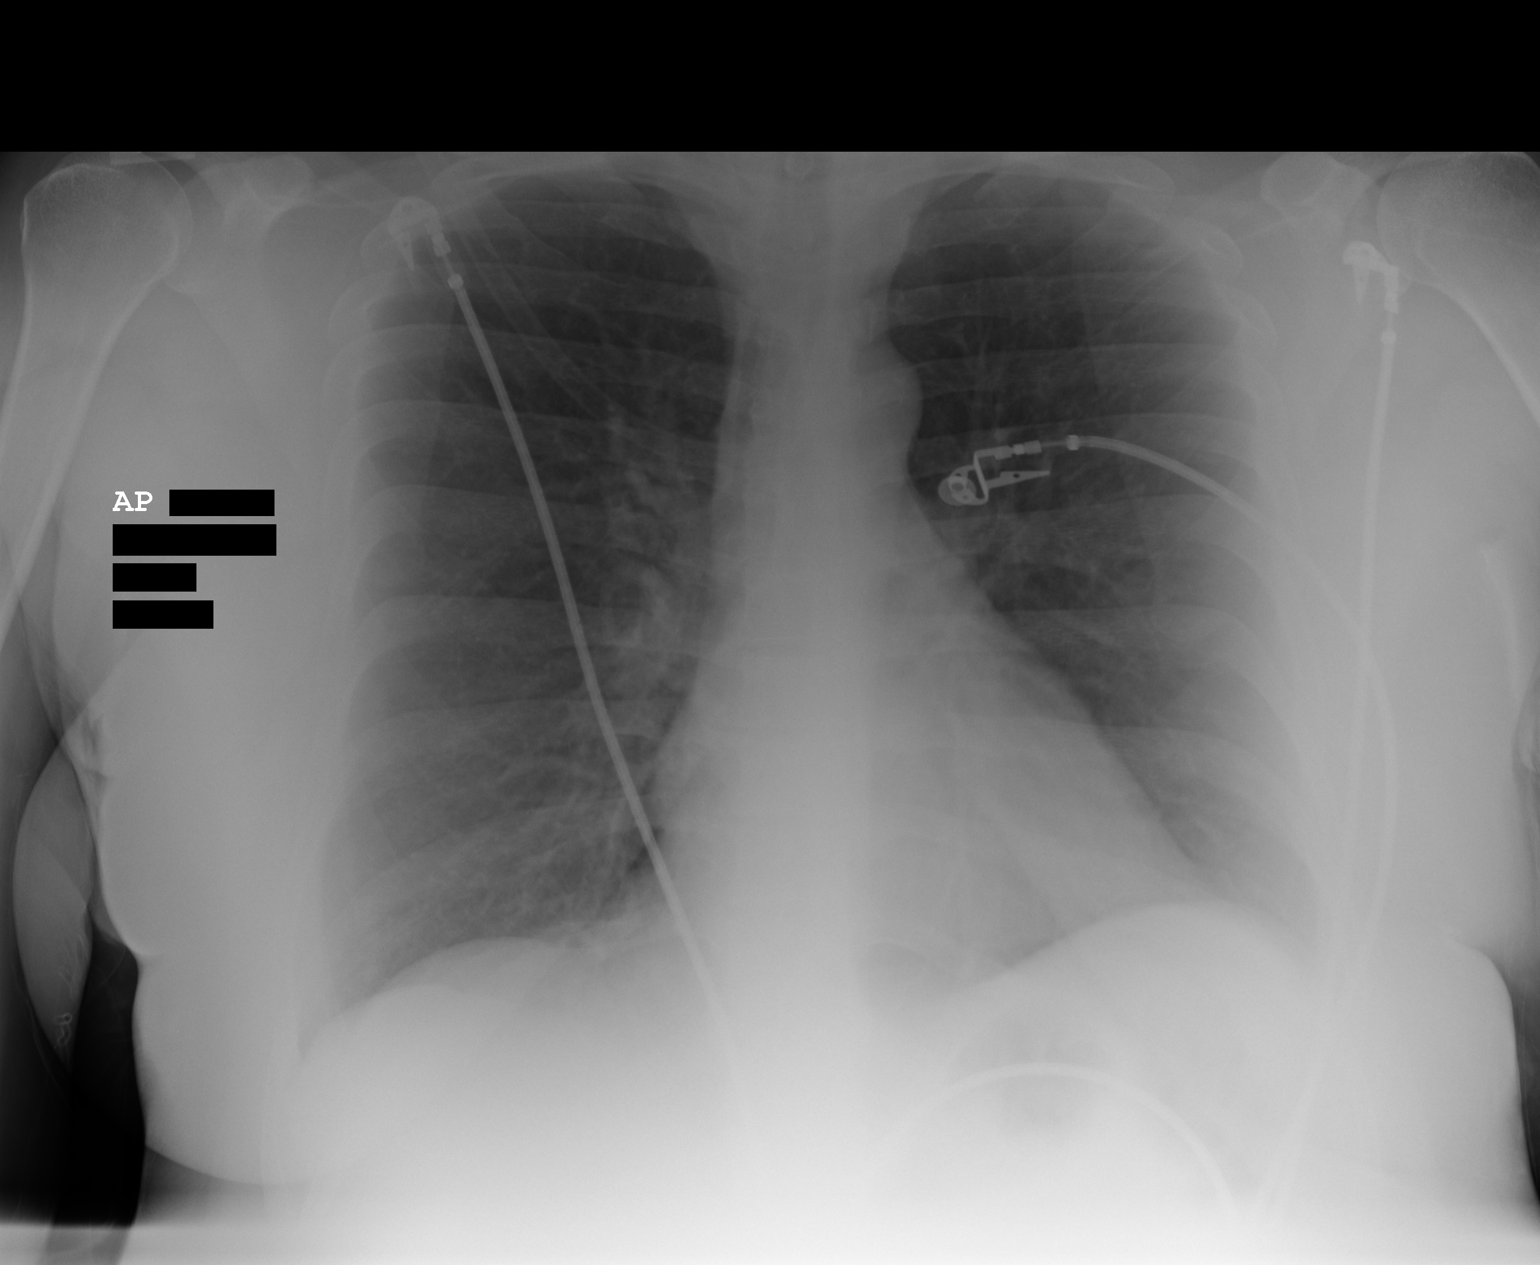

[1 of 1 positions shown; findings below may reference images not displayed]

IMPRESSION: 1. Chest radiograph without evidence of acute cardiopulmonary disease.
2. Comparison made to prior study dated 04/17/2010.

## 2011-10-02 ENCOUNTER — Emergency Department: Payer: Self-pay | Admitting: *Deleted

## 2011-10-28 ENCOUNTER — Emergency Department: Payer: Self-pay | Admitting: Emergency Medicine

## 2011-11-06 ENCOUNTER — Emergency Department: Payer: Self-pay | Admitting: Emergency Medicine

## 2011-11-12 ENCOUNTER — Emergency Department: Payer: Self-pay | Admitting: *Deleted

## 2011-11-30 DIAGNOSIS — J449 Chronic obstructive pulmonary disease, unspecified: Secondary | ICD-10-CM | POA: Diagnosis not present

## 2011-11-30 DIAGNOSIS — I10 Essential (primary) hypertension: Secondary | ICD-10-CM | POA: Diagnosis not present

## 2011-11-30 DIAGNOSIS — E1149 Type 2 diabetes mellitus with other diabetic neurological complication: Secondary | ICD-10-CM | POA: Diagnosis not present

## 2011-11-30 DIAGNOSIS — Z9981 Dependence on supplemental oxygen: Secondary | ICD-10-CM | POA: Diagnosis not present

## 2011-11-30 DIAGNOSIS — Z87891 Personal history of nicotine dependence: Secondary | ICD-10-CM | POA: Diagnosis not present

## 2011-11-30 DIAGNOSIS — G8929 Other chronic pain: Secondary | ICD-10-CM | POA: Diagnosis not present

## 2011-11-30 DIAGNOSIS — G479 Sleep disorder, unspecified: Secondary | ICD-10-CM | POA: Diagnosis not present

## 2011-11-30 DIAGNOSIS — E1142 Type 2 diabetes mellitus with diabetic polyneuropathy: Secondary | ICD-10-CM | POA: Diagnosis not present

## 2011-12-01 IMAGING — CR DG CHEST 2V
1 series · 2 of 2 positions shown · non-contrast
Comparison: none

REASON FOR EXAM: SOB
COMMENTS:

[Series 1: view not recorded · 0.17mm/px · 2 of 2 slices shown]
[im 1/2]
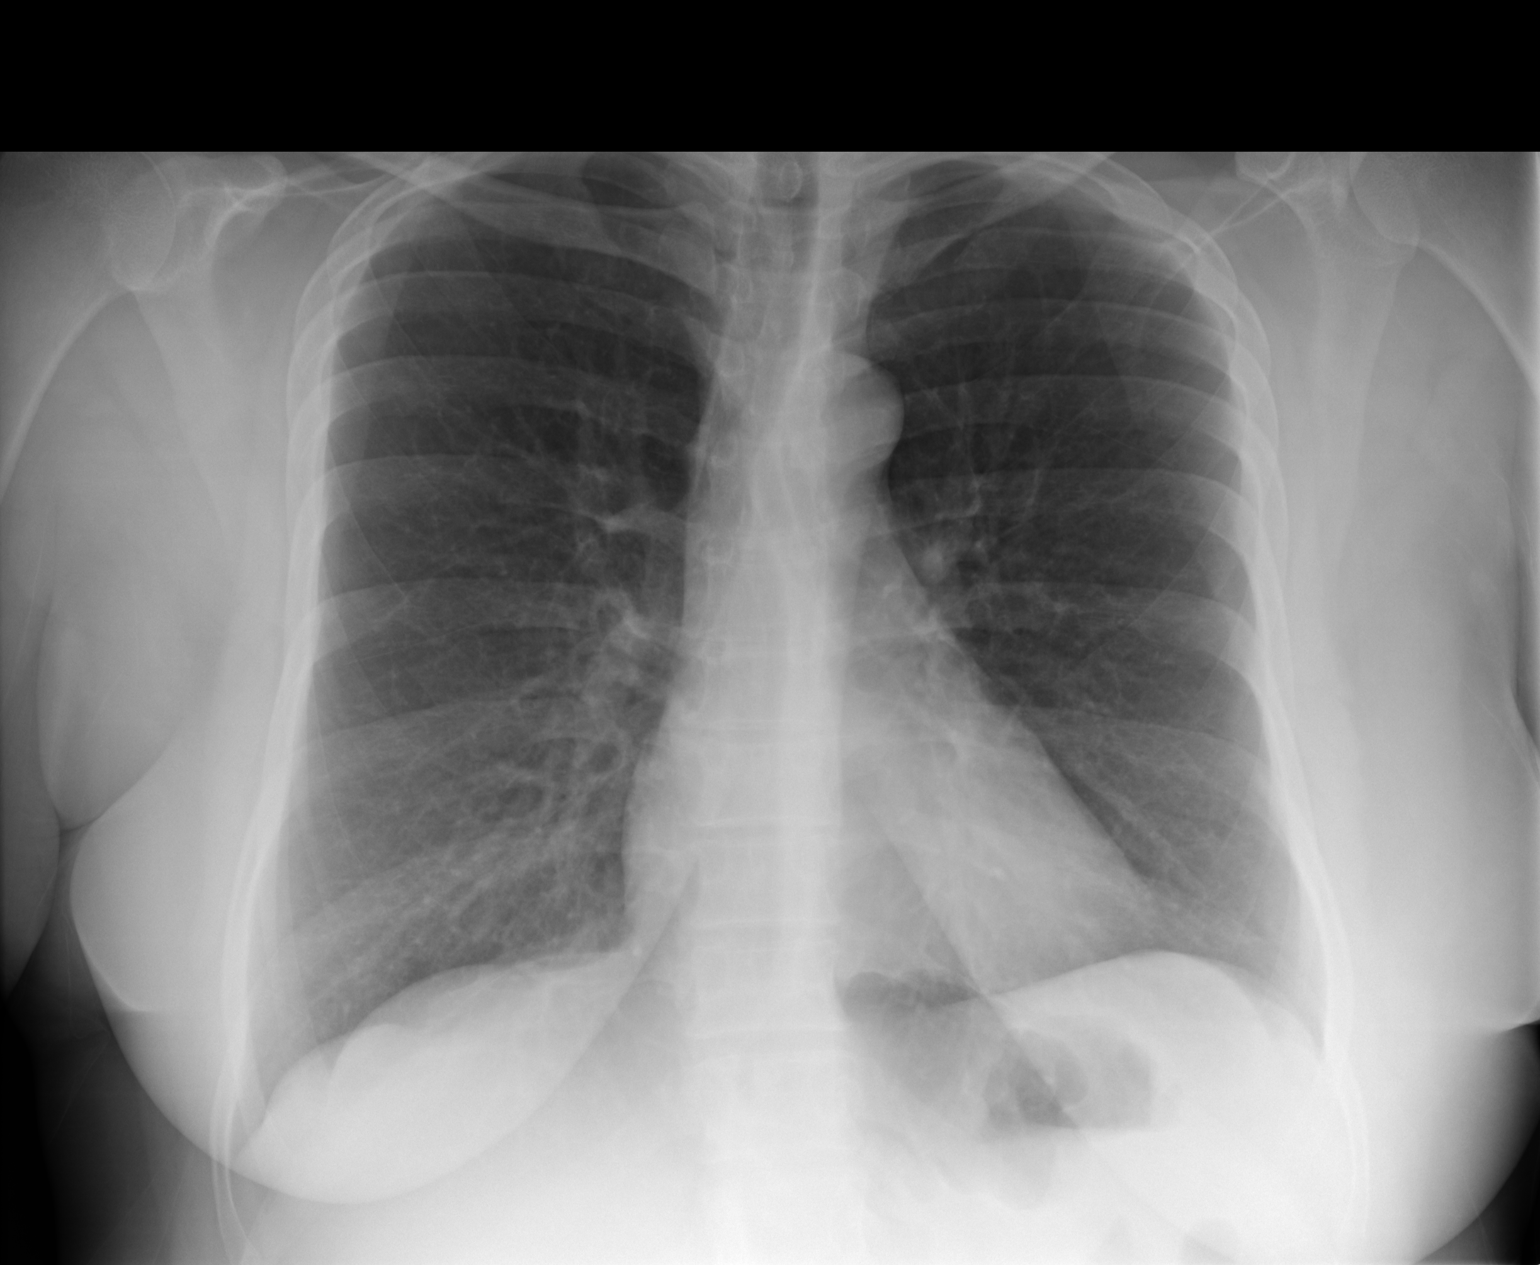
[im 2/2]
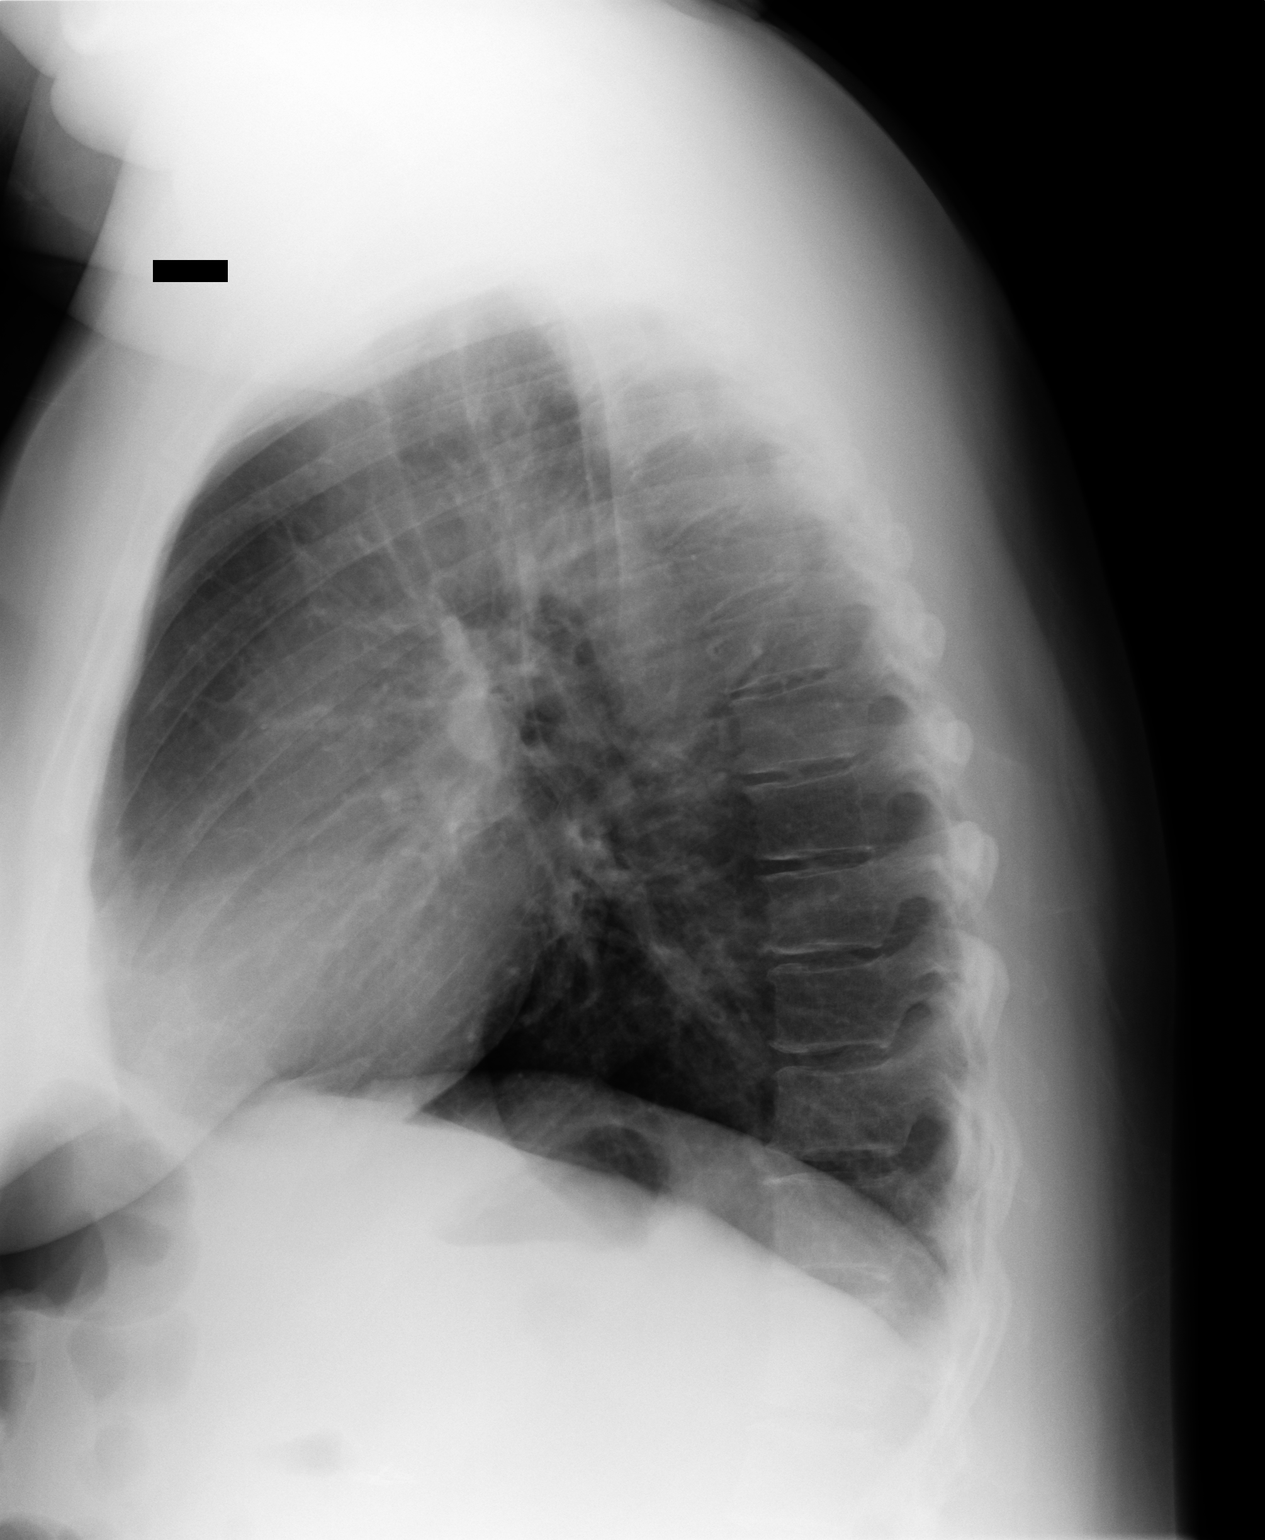

[2 of 2 positions shown; findings below may reference images not displayed]

PROCEDURE:     DXR - DXR CHEST PA (OR AP) AND LATERAL  - August 01, 2010  [DATE]

RESULT:     Comparison is made to the exam of 05/30/2010.

The lungs are clear. The heart and pulmonary vessels are normal. The bony
and mediastinal structures are unremarkable. There is no effusion. There is
no pneumothorax or evidence of congestive failure.
IMPRESSION: No acute cardiopulmonary disease.

## 2011-12-15 DIAGNOSIS — I10 Essential (primary) hypertension: Secondary | ICD-10-CM | POA: Diagnosis not present

## 2011-12-15 DIAGNOSIS — E1149 Type 2 diabetes mellitus with other diabetic neurological complication: Secondary | ICD-10-CM | POA: Diagnosis not present

## 2011-12-15 DIAGNOSIS — G8929 Other chronic pain: Secondary | ICD-10-CM | POA: Diagnosis not present

## 2011-12-15 DIAGNOSIS — J449 Chronic obstructive pulmonary disease, unspecified: Secondary | ICD-10-CM | POA: Diagnosis not present

## 2011-12-15 DIAGNOSIS — E1142 Type 2 diabetes mellitus with diabetic polyneuropathy: Secondary | ICD-10-CM | POA: Diagnosis not present

## 2011-12-15 DIAGNOSIS — G479 Sleep disorder, unspecified: Secondary | ICD-10-CM | POA: Diagnosis not present

## 2012-01-02 IMAGING — CR DG CHEST 1V PORT
1 series · 1 of 1 positions shown · non-contrast
Comparison: none

REASON FOR EXAM: sob
COMMENTS:

PROCEDURE:     DXR - DXR PORTABLE CHEST SINGLE VIEW  - September 02, 2010  [DATE]
RESULT:     Comparison is made to the prior exam 08/01/2010. The lung fields
are clear. No pneumonia, pneumothorax or pleural effusion is seen. The chest
appears mildly hyperexpanded.

[view not recorded]
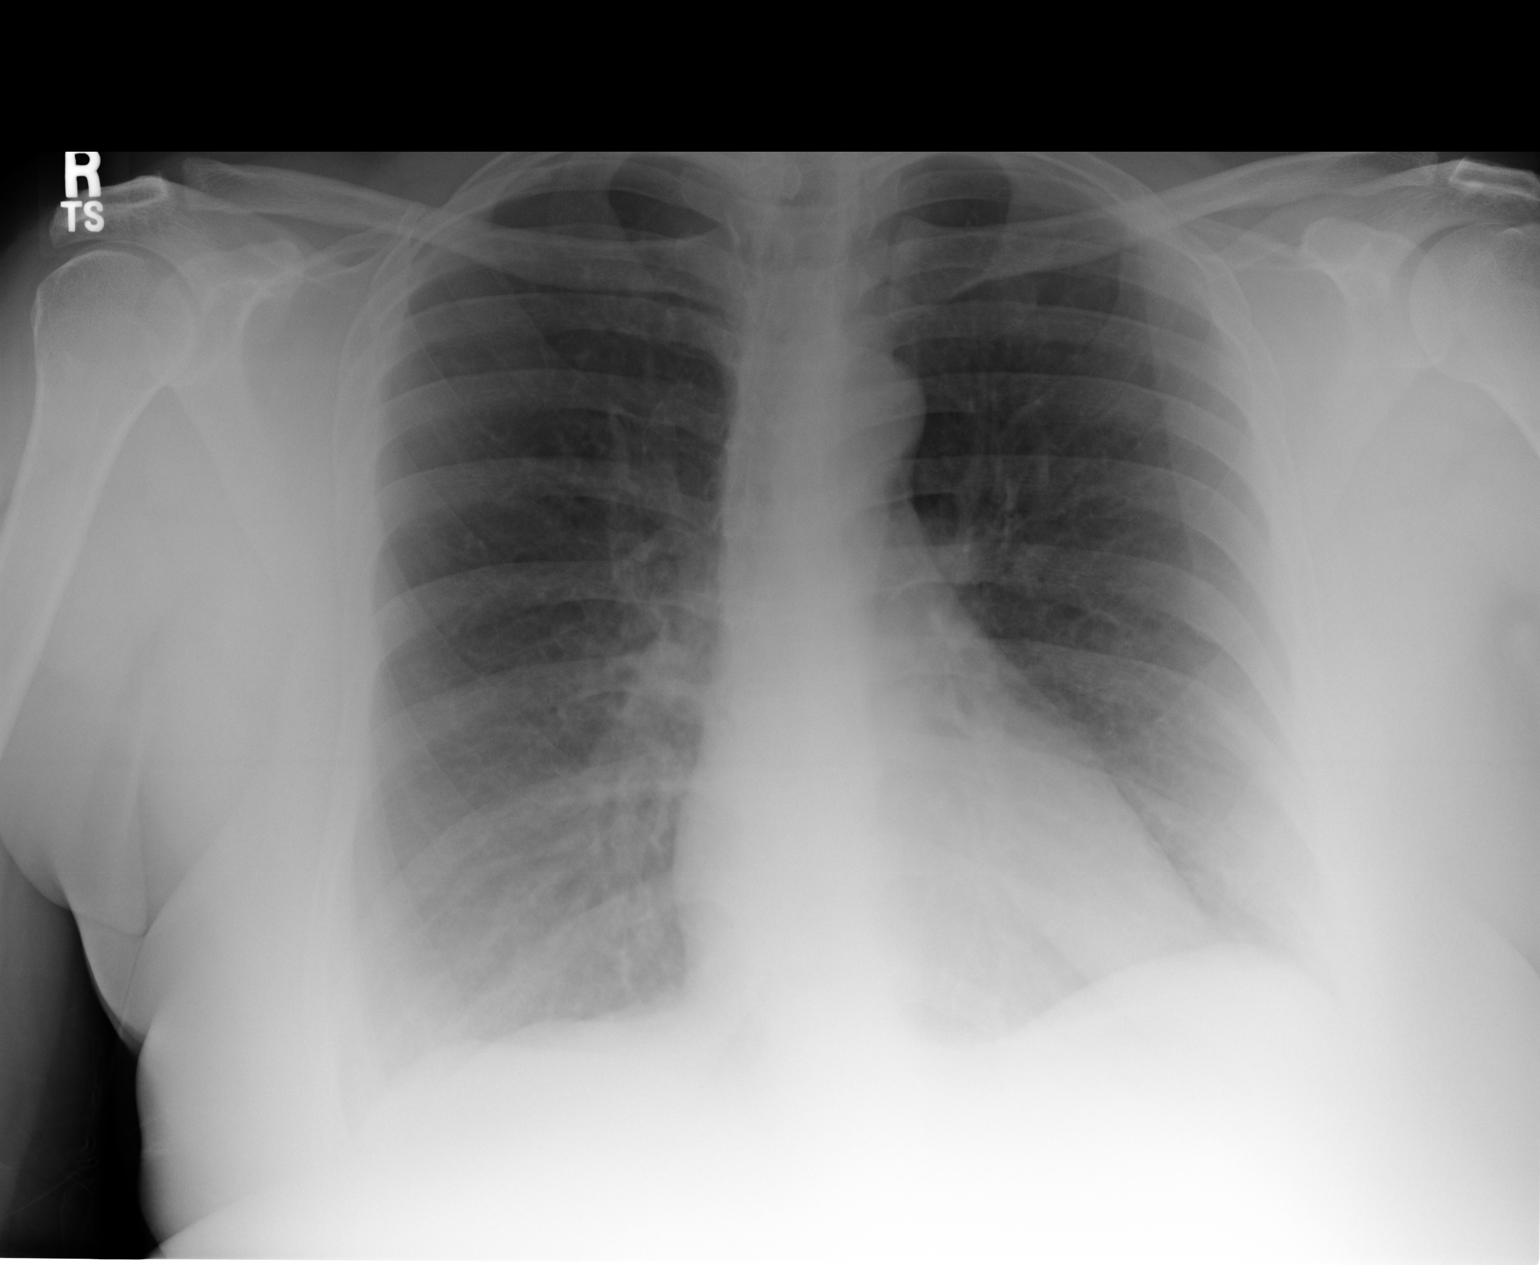

[1 of 1 positions shown; findings below may reference images not displayed]

IMPRESSION: 1. The lung fields are clear.
2. The chest appears mildly expanded.

## 2012-02-02 ENCOUNTER — Emergency Department: Payer: Self-pay | Admitting: Internal Medicine

## 2012-02-02 DIAGNOSIS — Z79899 Other long term (current) drug therapy: Secondary | ICD-10-CM | POA: Diagnosis not present

## 2012-02-02 DIAGNOSIS — E119 Type 2 diabetes mellitus without complications: Secondary | ICD-10-CM | POA: Diagnosis not present

## 2012-02-02 DIAGNOSIS — I10 Essential (primary) hypertension: Secondary | ICD-10-CM | POA: Diagnosis not present

## 2012-02-02 DIAGNOSIS — J45901 Unspecified asthma with (acute) exacerbation: Secondary | ICD-10-CM | POA: Diagnosis not present

## 2012-02-02 DIAGNOSIS — R0602 Shortness of breath: Secondary | ICD-10-CM | POA: Diagnosis not present

## 2012-02-02 DIAGNOSIS — R6889 Other general symptoms and signs: Secondary | ICD-10-CM | POA: Diagnosis not present

## 2012-02-02 LAB — COMPREHENSIVE METABOLIC PANEL
Albumin: 3.7 g/dL (ref 3.4–5.0)
Anion Gap: 11 (ref 7–16)
Calcium, Total: 9 mg/dL (ref 8.5–10.1)
Chloride: 106 mmol/L (ref 98–107)
Co2: 26 mmol/L (ref 21–32)
Osmolality: 282 (ref 275–301)
Potassium: 4.1 mmol/L (ref 3.5–5.1)
SGOT(AST): 27 U/L (ref 15–37)
Total Protein: 7.3 g/dL (ref 6.4–8.2)

## 2012-02-02 LAB — CBC
HCT: 43 % (ref 35.0–47.0)
MCH: 29.1 pg (ref 26.0–34.0)
MCV: 89 fL (ref 80–100)
Platelet: 272 10*3/uL (ref 150–440)
RBC: 4.85 10*6/uL (ref 3.80–5.20)
WBC: 8 10*3/uL (ref 3.6–11.0)

## 2012-02-07 LAB — CULTURE, BLOOD (SINGLE)

## 2012-02-15 DIAGNOSIS — J309 Allergic rhinitis, unspecified: Secondary | ICD-10-CM | POA: Diagnosis not present

## 2012-02-21 ENCOUNTER — Emergency Department: Payer: Self-pay | Admitting: Emergency Medicine

## 2012-02-21 DIAGNOSIS — J45901 Unspecified asthma with (acute) exacerbation: Secondary | ICD-10-CM | POA: Diagnosis not present

## 2012-02-21 DIAGNOSIS — F17200 Nicotine dependence, unspecified, uncomplicated: Secondary | ICD-10-CM | POA: Diagnosis not present

## 2012-02-21 DIAGNOSIS — R6889 Other general symptoms and signs: Secondary | ICD-10-CM | POA: Diagnosis not present

## 2012-02-21 DIAGNOSIS — R05 Cough: Secondary | ICD-10-CM | POA: Diagnosis not present

## 2012-02-24 IMAGING — CR DG CHEST 1V PORT
1 series · 1 of 1 positions shown · non-contrast
Comparison: none

REASON FOR EXAM: Shortness of Breath
COMMENTS:

[view not recorded]
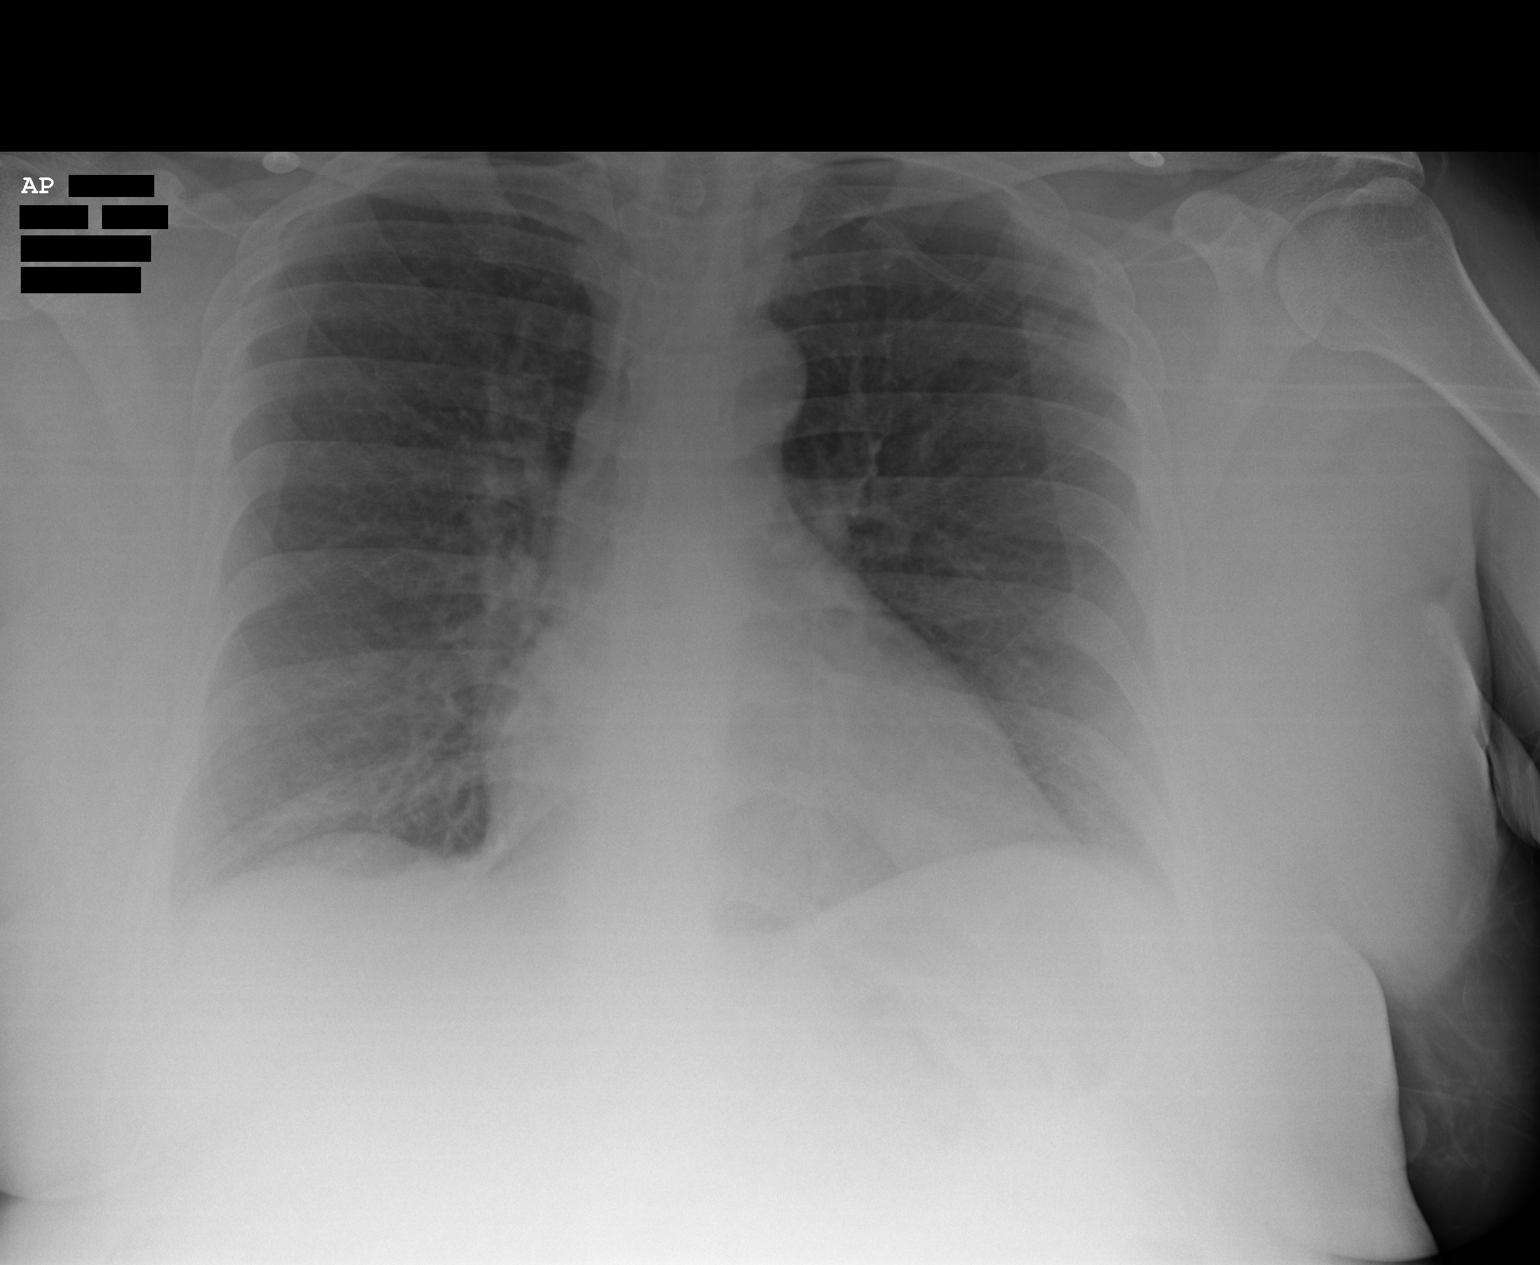

[1 of 1 positions shown; findings below may reference images not displayed]

PROCEDURE:     DXR - DXR PORTABLE CHEST SINGLE VIEW  - October 25, 2010  [DATE]

RESULT:     Comparison is made to the study of 09/20/2010.

The lungs are clear. The heart and pulmonary vessels are normal. The bony
and mediastinal structures are unremarkable. There is no effusion. There is
no pneumothorax or evidence of congestive failure.
IMPRESSION: No acute cardiopulmonary disease.

## 2012-02-27 DIAGNOSIS — J45901 Unspecified asthma with (acute) exacerbation: Secondary | ICD-10-CM | POA: Diagnosis not present

## 2012-02-27 DIAGNOSIS — F17200 Nicotine dependence, unspecified, uncomplicated: Secondary | ICD-10-CM | POA: Diagnosis not present

## 2012-04-29 ENCOUNTER — Emergency Department: Payer: Self-pay | Admitting: *Deleted

## 2012-04-29 DIAGNOSIS — J449 Chronic obstructive pulmonary disease, unspecified: Secondary | ICD-10-CM | POA: Diagnosis not present

## 2012-04-29 DIAGNOSIS — I10 Essential (primary) hypertension: Secondary | ICD-10-CM | POA: Diagnosis not present

## 2012-04-29 DIAGNOSIS — R6889 Other general symptoms and signs: Secondary | ICD-10-CM | POA: Diagnosis not present

## 2012-04-29 DIAGNOSIS — Z79899 Other long term (current) drug therapy: Secondary | ICD-10-CM | POA: Diagnosis not present

## 2012-04-29 DIAGNOSIS — J45901 Unspecified asthma with (acute) exacerbation: Secondary | ICD-10-CM | POA: Diagnosis not present

## 2012-04-29 DIAGNOSIS — R0609 Other forms of dyspnea: Secondary | ICD-10-CM | POA: Diagnosis not present

## 2012-04-29 DIAGNOSIS — F17200 Nicotine dependence, unspecified, uncomplicated: Secondary | ICD-10-CM | POA: Diagnosis not present

## 2012-04-29 DIAGNOSIS — E119 Type 2 diabetes mellitus without complications: Secondary | ICD-10-CM | POA: Diagnosis not present

## 2012-05-07 ENCOUNTER — Inpatient Hospital Stay: Payer: Self-pay | Admitting: Internal Medicine

## 2012-05-07 DIAGNOSIS — J40 Bronchitis, not specified as acute or chronic: Secondary | ICD-10-CM | POA: Diagnosis not present

## 2012-05-07 DIAGNOSIS — Z6841 Body Mass Index (BMI) 40.0 and over, adult: Secondary | ICD-10-CM | POA: Diagnosis not present

## 2012-05-07 DIAGNOSIS — R0602 Shortness of breath: Secondary | ICD-10-CM | POA: Diagnosis not present

## 2012-05-07 DIAGNOSIS — J45901 Unspecified asthma with (acute) exacerbation: Secondary | ICD-10-CM | POA: Diagnosis not present

## 2012-05-07 DIAGNOSIS — J441 Chronic obstructive pulmonary disease with (acute) exacerbation: Secondary | ICD-10-CM | POA: Diagnosis not present

## 2012-05-07 DIAGNOSIS — E119 Type 2 diabetes mellitus without complications: Secondary | ICD-10-CM | POA: Diagnosis not present

## 2012-05-07 DIAGNOSIS — Z794 Long term (current) use of insulin: Secondary | ICD-10-CM | POA: Diagnosis not present

## 2012-05-07 DIAGNOSIS — F17200 Nicotine dependence, unspecified, uncomplicated: Secondary | ICD-10-CM | POA: Diagnosis not present

## 2012-05-07 DIAGNOSIS — R6889 Other general symptoms and signs: Secondary | ICD-10-CM | POA: Diagnosis not present

## 2012-05-07 DIAGNOSIS — E669 Obesity, unspecified: Secondary | ICD-10-CM | POA: Diagnosis present

## 2012-05-07 DIAGNOSIS — J449 Chronic obstructive pulmonary disease, unspecified: Secondary | ICD-10-CM | POA: Diagnosis not present

## 2012-05-07 DIAGNOSIS — I10 Essential (primary) hypertension: Secondary | ICD-10-CM | POA: Diagnosis not present

## 2012-05-07 LAB — CBC
HCT: 43.7 % (ref 35.0–47.0)
HGB: 14.1 g/dL (ref 12.0–16.0)
MCH: 28.8 pg (ref 26.0–34.0)
MCHC: 32.2 g/dL (ref 32.0–36.0)
MCV: 89 fL (ref 80–100)
Platelet: 341 10*3/uL (ref 150–440)
RDW: 15 % — ABNORMAL HIGH (ref 11.5–14.5)
WBC: 9.2 10*3/uL (ref 3.6–11.0)

## 2012-05-07 LAB — URINALYSIS, COMPLETE
Bilirubin,UR: NEGATIVE
Ketone: NEGATIVE
Ph: 7 (ref 4.5–8.0)
Specific Gravity: 1.015 (ref 1.003–1.030)
Squamous Epithelial: 2
WBC UR: 12 /HPF (ref 0–5)

## 2012-05-07 LAB — COMPREHENSIVE METABOLIC PANEL
Albumin: 3.3 g/dL — ABNORMAL LOW (ref 3.4–5.0)
Alkaline Phosphatase: 75 U/L (ref 50–136)
Anion Gap: 6 — ABNORMAL LOW (ref 7–16)
Bilirubin,Total: 0.4 mg/dL (ref 0.2–1.0)
Calcium, Total: 8.4 mg/dL — ABNORMAL LOW (ref 8.5–10.1)
Co2: 32 mmol/L (ref 21–32)
Creatinine: 1.32 mg/dL — ABNORMAL HIGH (ref 0.60–1.30)
EGFR (Non-African Amer.): 48 — ABNORMAL LOW
Osmolality: 282 (ref 275–301)
Potassium: 3.5 mmol/L (ref 3.5–5.1)
Sodium: 141 mmol/L (ref 136–145)
Total Protein: 6.4 g/dL (ref 6.4–8.2)

## 2012-05-07 LAB — TROPONIN I: Troponin-I: <0.02 ng/mL

## 2012-05-07 LAB — CK TOTAL AND CKMB (NOT AT ARMC)
CK, Total: 117 U/L (ref 21–215)
CK-MB: 0.8 ng/mL (ref 0.5–3.6)

## 2012-05-07 LAB — PRO B NATRIURETIC PEPTIDE: B-Type Natriuretic Peptide: 88 pg/mL (ref 0–125)

## 2012-05-08 LAB — CBC WITH DIFFERENTIAL/PLATELET
Basophil #: 0 10*3/uL (ref 0.0–0.1)
Eosinophil %: 0.1 %
Lymphocyte #: 1.6 10*3/uL (ref 1.0–3.6)
MCH: 28.4 pg (ref 26.0–34.0)
Monocyte #: 0.3 x10 3/mm (ref 0.2–0.9)
Monocyte %: 2.3 %
Neutrophil #: 12.3 10*3/uL — ABNORMAL HIGH (ref 1.4–6.5)
Platelet: 373 10*3/uL (ref 150–440)
RDW: 15 % — ABNORMAL HIGH (ref 11.5–14.5)
WBC: 14.3 10*3/uL — ABNORMAL HIGH (ref 3.6–11.0)

## 2012-05-08 LAB — BASIC METABOLIC PANEL
Anion Gap: 10 (ref 7–16)
BUN: 14 mg/dL (ref 7–18)
Chloride: 104 mmol/L (ref 98–107)
Co2: 26 mmol/L (ref 21–32)
EGFR (African American): 50 — ABNORMAL LOW
EGFR (Non-African Amer.): 43 — ABNORMAL LOW
Glucose: 188 mg/dL — ABNORMAL HIGH (ref 65–99)
Osmolality: 285 (ref 275–301)
Potassium: 4.1 mmol/L (ref 3.5–5.1)
Sodium: 140 mmol/L (ref 136–145)

## 2012-05-13 LAB — CULTURE, BLOOD (SINGLE)

## 2012-05-21 ENCOUNTER — Emergency Department: Payer: Self-pay | Admitting: Emergency Medicine

## 2012-05-21 DIAGNOSIS — R0602 Shortness of breath: Secondary | ICD-10-CM | POA: Diagnosis not present

## 2012-05-21 DIAGNOSIS — R6889 Other general symptoms and signs: Secondary | ICD-10-CM | POA: Diagnosis not present

## 2012-05-21 DIAGNOSIS — F17200 Nicotine dependence, unspecified, uncomplicated: Secondary | ICD-10-CM | POA: Diagnosis not present

## 2012-05-21 DIAGNOSIS — J441 Chronic obstructive pulmonary disease with (acute) exacerbation: Secondary | ICD-10-CM | POA: Diagnosis not present

## 2012-05-21 DIAGNOSIS — Z79899 Other long term (current) drug therapy: Secondary | ICD-10-CM | POA: Diagnosis not present

## 2012-05-21 LAB — BASIC METABOLIC PANEL
Anion Gap: 8 (ref 7–16)
BUN: 13 mg/dL (ref 7–18)
Calcium, Total: 9 mg/dL (ref 8.5–10.1)
Chloride: 108 mmol/L — ABNORMAL HIGH (ref 98–107)
Co2: 24 mmol/L (ref 21–32)
Creatinine: 1.34 mg/dL — ABNORMAL HIGH (ref 0.60–1.30)
EGFR (African American): 55 — ABNORMAL LOW
EGFR (Non-African Amer.): 47 — ABNORMAL LOW
Glucose: 138 mg/dL — ABNORMAL HIGH (ref 65–99)
Osmolality: 282 (ref 275–301)
Potassium: 4.2 mmol/L (ref 3.5–5.1)

## 2012-05-21 LAB — CBC
MCV: 90 fL (ref 80–100)
Platelet: 285 10*3/uL (ref 150–440)
RBC: 4.79 10*6/uL (ref 3.80–5.20)
RDW: 15.7 % — ABNORMAL HIGH (ref 11.5–14.5)
WBC: 9.2 10*3/uL (ref 3.6–11.0)

## 2012-06-06 ENCOUNTER — Emergency Department: Payer: Self-pay | Admitting: Emergency Medicine

## 2012-06-06 DIAGNOSIS — I10 Essential (primary) hypertension: Secondary | ICD-10-CM | POA: Diagnosis not present

## 2012-06-06 DIAGNOSIS — J42 Unspecified chronic bronchitis: Secondary | ICD-10-CM | POA: Diagnosis not present

## 2012-06-06 DIAGNOSIS — R062 Wheezing: Secondary | ICD-10-CM | POA: Diagnosis not present

## 2012-06-06 DIAGNOSIS — R0609 Other forms of dyspnea: Secondary | ICD-10-CM | POA: Diagnosis not present

## 2012-06-06 DIAGNOSIS — E119 Type 2 diabetes mellitus without complications: Secondary | ICD-10-CM | POA: Diagnosis not present

## 2012-06-06 DIAGNOSIS — R6889 Other general symptoms and signs: Secondary | ICD-10-CM | POA: Diagnosis not present

## 2012-06-06 DIAGNOSIS — Z79899 Other long term (current) drug therapy: Secondary | ICD-10-CM | POA: Diagnosis not present

## 2012-06-06 DIAGNOSIS — F17200 Nicotine dependence, unspecified, uncomplicated: Secondary | ICD-10-CM | POA: Diagnosis not present

## 2012-06-06 LAB — CBC WITH DIFFERENTIAL/PLATELET
Basophil #: 0 10*3/uL (ref 0.0–0.1)
Basophil %: 0.4 %
Eosinophil %: 2.4 %
Lymphocyte #: 3.1 10*3/uL (ref 1.0–3.6)
Lymphocyte %: 37.8 %
MCV: 90 fL (ref 80–100)
Monocyte #: 0.4 x10 3/mm (ref 0.2–0.9)
Neutrophil #: 4.4 10*3/uL (ref 1.4–6.5)
Neutrophil %: 54 %
Platelet: 265 10*3/uL (ref 150–440)
RBC: 4.53 10*6/uL (ref 3.80–5.20)
RDW: 15 % — ABNORMAL HIGH (ref 11.5–14.5)

## 2012-06-06 LAB — CK TOTAL AND CKMB (NOT AT ARMC): CK-MB: 0.6 ng/mL (ref 0.5–3.6)

## 2012-06-06 LAB — PROTIME-INR
INR: 0.8
Prothrombin Time: 11.8 secs (ref 11.5–14.7)

## 2012-06-06 LAB — COMPREHENSIVE METABOLIC PANEL
Alkaline Phosphatase: 78 U/L (ref 50–136)
BUN: 16 mg/dL (ref 7–18)
Bilirubin,Total: 0.3 mg/dL (ref 0.2–1.0)
Chloride: 105 mmol/L (ref 98–107)
Co2: 28 mmol/L (ref 21–32)
Creatinine: 1.38 mg/dL — ABNORMAL HIGH (ref 0.60–1.30)
EGFR (Non-African Amer.): 45 — ABNORMAL LOW
SGPT (ALT): 45 U/L

## 2012-06-06 LAB — URINALYSIS, COMPLETE
Bilirubin,UR: NEGATIVE
Glucose,UR: >=500 mg/dL (ref 0–75)
Ketone: NEGATIVE
Nitrite: POSITIVE
Protein: 100
RBC,UR: 13 /HPF (ref 0–5)
Specific Gravity: 1.021 (ref 1.003–1.030)
Squamous Epithelial: 4
WBC UR: 150 /HPF (ref 0–5)

## 2012-06-06 LAB — TROPONIN I: Troponin-I: <0.02 ng/mL

## 2012-06-11 DIAGNOSIS — J309 Allergic rhinitis, unspecified: Secondary | ICD-10-CM | POA: Diagnosis not present

## 2012-06-11 DIAGNOSIS — J449 Chronic obstructive pulmonary disease, unspecified: Secondary | ICD-10-CM | POA: Diagnosis not present

## 2012-06-11 DIAGNOSIS — J45901 Unspecified asthma with (acute) exacerbation: Secondary | ICD-10-CM | POA: Diagnosis not present

## 2012-06-11 DIAGNOSIS — J441 Chronic obstructive pulmonary disease with (acute) exacerbation: Secondary | ICD-10-CM | POA: Diagnosis not present

## 2012-06-11 DIAGNOSIS — F17200 Nicotine dependence, unspecified, uncomplicated: Secondary | ICD-10-CM | POA: Diagnosis not present

## 2012-09-17 DIAGNOSIS — Z0000 Encounter for general adult medical examination without abnormal findings: Secondary | ICD-10-CM | POA: Diagnosis not present

## 2012-09-17 DIAGNOSIS — R0602 Shortness of breath: Secondary | ICD-10-CM | POA: Diagnosis not present

## 2012-09-17 DIAGNOSIS — J069 Acute upper respiratory infection, unspecified: Secondary | ICD-10-CM | POA: Diagnosis not present

## 2012-09-17 DIAGNOSIS — R5383 Other fatigue: Secondary | ICD-10-CM | POA: Diagnosis not present

## 2012-09-17 DIAGNOSIS — J449 Chronic obstructive pulmonary disease, unspecified: Secondary | ICD-10-CM | POA: Diagnosis not present

## 2012-09-17 DIAGNOSIS — I10 Essential (primary) hypertension: Secondary | ICD-10-CM | POA: Diagnosis not present

## 2012-09-17 DIAGNOSIS — E119 Type 2 diabetes mellitus without complications: Secondary | ICD-10-CM | POA: Diagnosis not present

## 2012-09-17 DIAGNOSIS — IMO0001 Reserved for inherently not codable concepts without codable children: Secondary | ICD-10-CM | POA: Diagnosis not present

## 2012-10-04 DIAGNOSIS — J069 Acute upper respiratory infection, unspecified: Secondary | ICD-10-CM | POA: Diagnosis not present

## 2012-10-04 DIAGNOSIS — F17200 Nicotine dependence, unspecified, uncomplicated: Secondary | ICD-10-CM | POA: Diagnosis not present

## 2012-10-04 DIAGNOSIS — J309 Allergic rhinitis, unspecified: Secondary | ICD-10-CM | POA: Diagnosis not present

## 2012-10-04 DIAGNOSIS — J45901 Unspecified asthma with (acute) exacerbation: Secondary | ICD-10-CM | POA: Diagnosis not present

## 2012-10-04 DIAGNOSIS — J441 Chronic obstructive pulmonary disease with (acute) exacerbation: Secondary | ICD-10-CM | POA: Diagnosis not present

## 2012-11-03 ENCOUNTER — Emergency Department: Payer: Self-pay | Admitting: Emergency Medicine

## 2012-11-03 DIAGNOSIS — J449 Chronic obstructive pulmonary disease, unspecified: Secondary | ICD-10-CM | POA: Diagnosis not present

## 2012-11-03 DIAGNOSIS — R0602 Shortness of breath: Secondary | ICD-10-CM | POA: Diagnosis not present

## 2012-11-03 DIAGNOSIS — F17200 Nicotine dependence, unspecified, uncomplicated: Secondary | ICD-10-CM | POA: Diagnosis not present

## 2012-11-03 DIAGNOSIS — M25519 Pain in unspecified shoulder: Secondary | ICD-10-CM | POA: Diagnosis not present

## 2012-11-03 DIAGNOSIS — J45901 Unspecified asthma with (acute) exacerbation: Secondary | ICD-10-CM | POA: Diagnosis not present

## 2012-11-03 DIAGNOSIS — E119 Type 2 diabetes mellitus without complications: Secondary | ICD-10-CM | POA: Diagnosis not present

## 2012-11-03 DIAGNOSIS — I10 Essential (primary) hypertension: Secondary | ICD-10-CM | POA: Diagnosis not present

## 2012-11-03 DIAGNOSIS — Z79899 Other long term (current) drug therapy: Secondary | ICD-10-CM | POA: Diagnosis not present

## 2012-11-03 LAB — TROPONIN I: Troponin-I: <0.02 ng/mL

## 2012-11-03 LAB — BASIC METABOLIC PANEL
Anion Gap: 5 — ABNORMAL LOW (ref 7–16)
BUN: 21 mg/dL — ABNORMAL HIGH (ref 7–18)
Calcium, Total: 9.1 mg/dL (ref 8.5–10.1)
Chloride: 109 mmol/L — ABNORMAL HIGH (ref 98–107)
Creatinine: 1.43 mg/dL — ABNORMAL HIGH (ref 0.60–1.30)
EGFR (Non-African Amer.): 43 — ABNORMAL LOW
Glucose: 104 mg/dL — ABNORMAL HIGH (ref 65–99)
Osmolality: 279 (ref 275–301)
Potassium: 4.2 mmol/L (ref 3.5–5.1)

## 2012-11-03 LAB — CBC
HCT: 42.9 % (ref 35.0–47.0)
MCH: 29.2 pg (ref 26.0–34.0)
MCHC: 33 g/dL (ref 32.0–36.0)
MCV: 88 fL (ref 80–100)
RDW: 15 % — ABNORMAL HIGH (ref 11.5–14.5)
WBC: 8.5 10*3/uL (ref 3.6–11.0)

## 2012-11-20 IMAGING — CR DG CHEST 1V PORT
1 series · 1 of 1 positions shown · non-contrast
Comparison: none

REASON FOR EXAM: sob
COMMENTS:

[view not recorded]
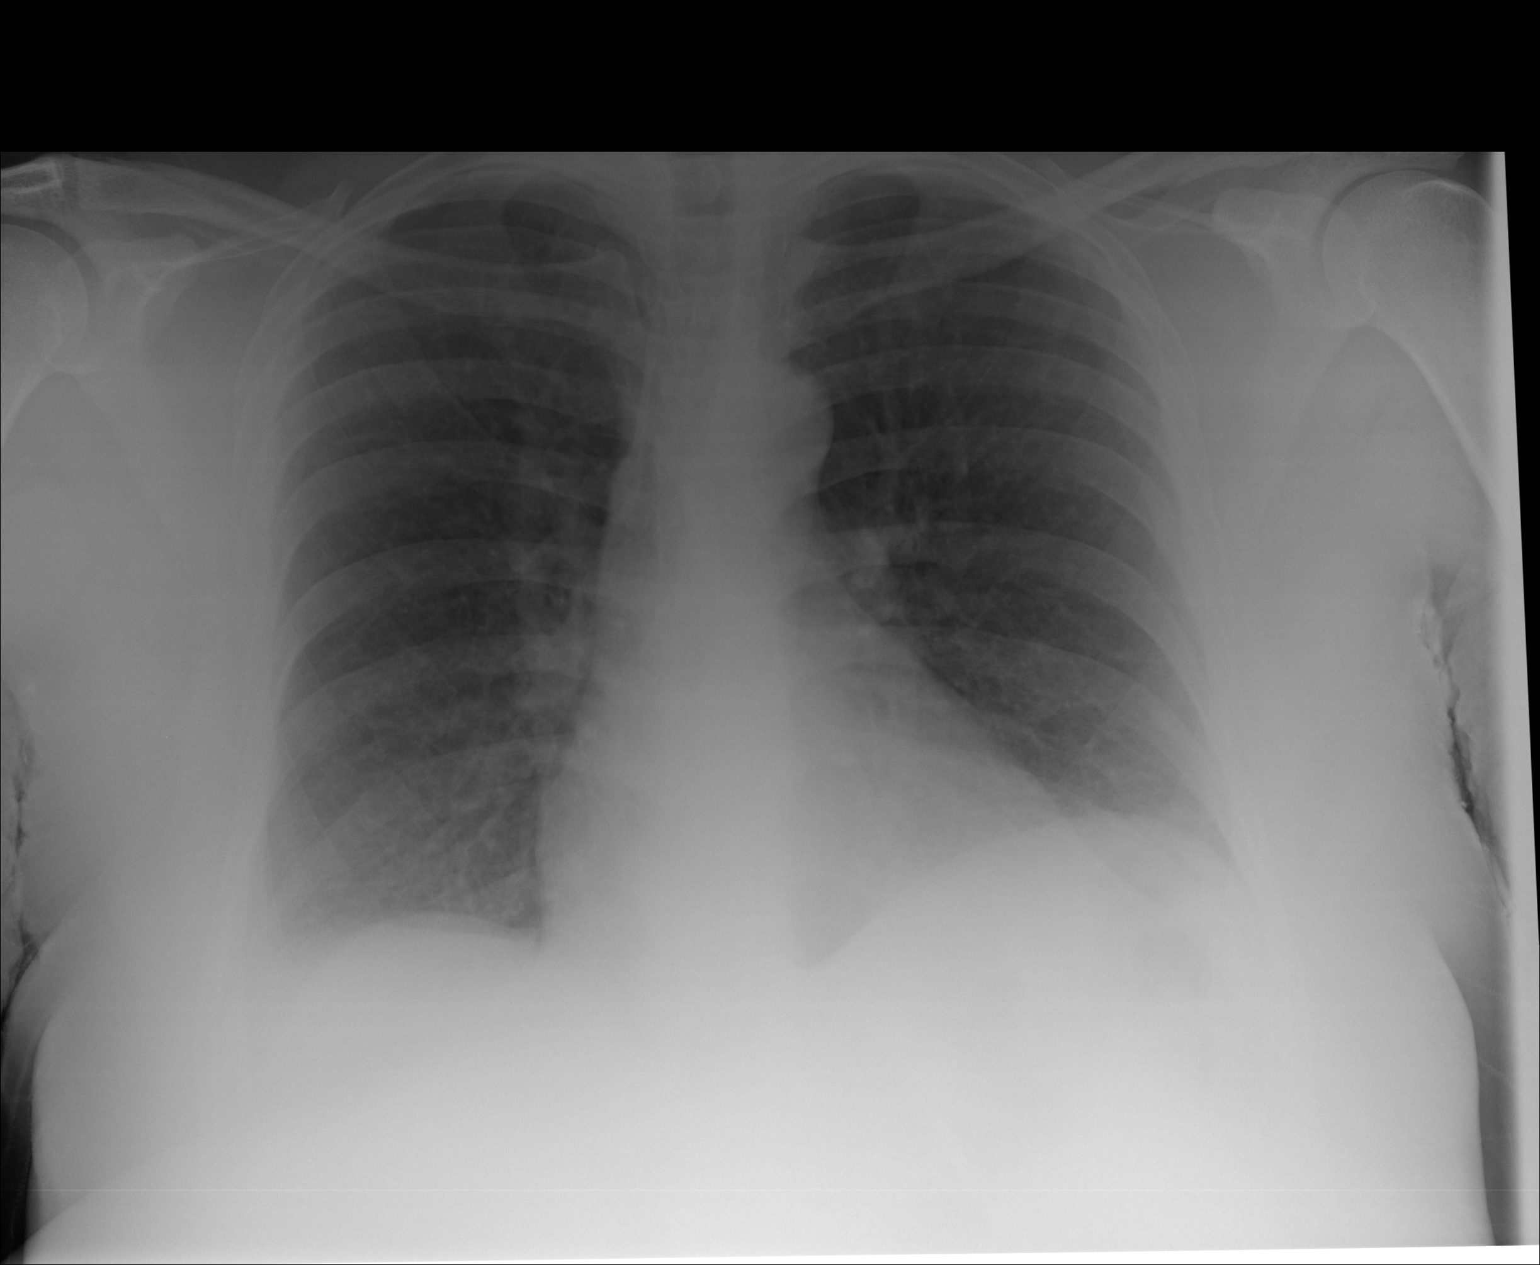

[1 of 1 positions shown; findings below may reference images not displayed]

PROCEDURE:     DXR - DXR PORTABLE CHEST SINGLE VIEW  - July 22, 2011  [DATE]

RESULT:     Comparison is made to the study dated 25 October, 2010.

The lungs are adequately inflated. The left hemidiaphragm is higher than the
right but this is not entirely new. It is more conspicuous today. The
central pulmonary vascularity is prominent. The pulmonary interstitial
markings are minimally prominent. The cardiac silhouette is top normal in
size.
IMPRESSION: Mildly increased interstitial markings appear chronic and
are likely related to the patient's long-standing reactive airway disease. I
see no focal pneumonia. The left hemidiaphragm is slightly higher than the
right though this is not an entirely new finding. A followup PA and lateral
chest x-ray would be of value.

## 2012-12-02 IMAGING — CR DG CHEST 2V
1 series · 2 of 2 positions shown · non-contrast
Comparison: none

REASON FOR EXAM: asthma attack
COMMENTS:   LMP: Two weeks ago

[Series 1: view not recorded · 0.17mm/px · 2 of 2 slices shown]
[im 1/2]
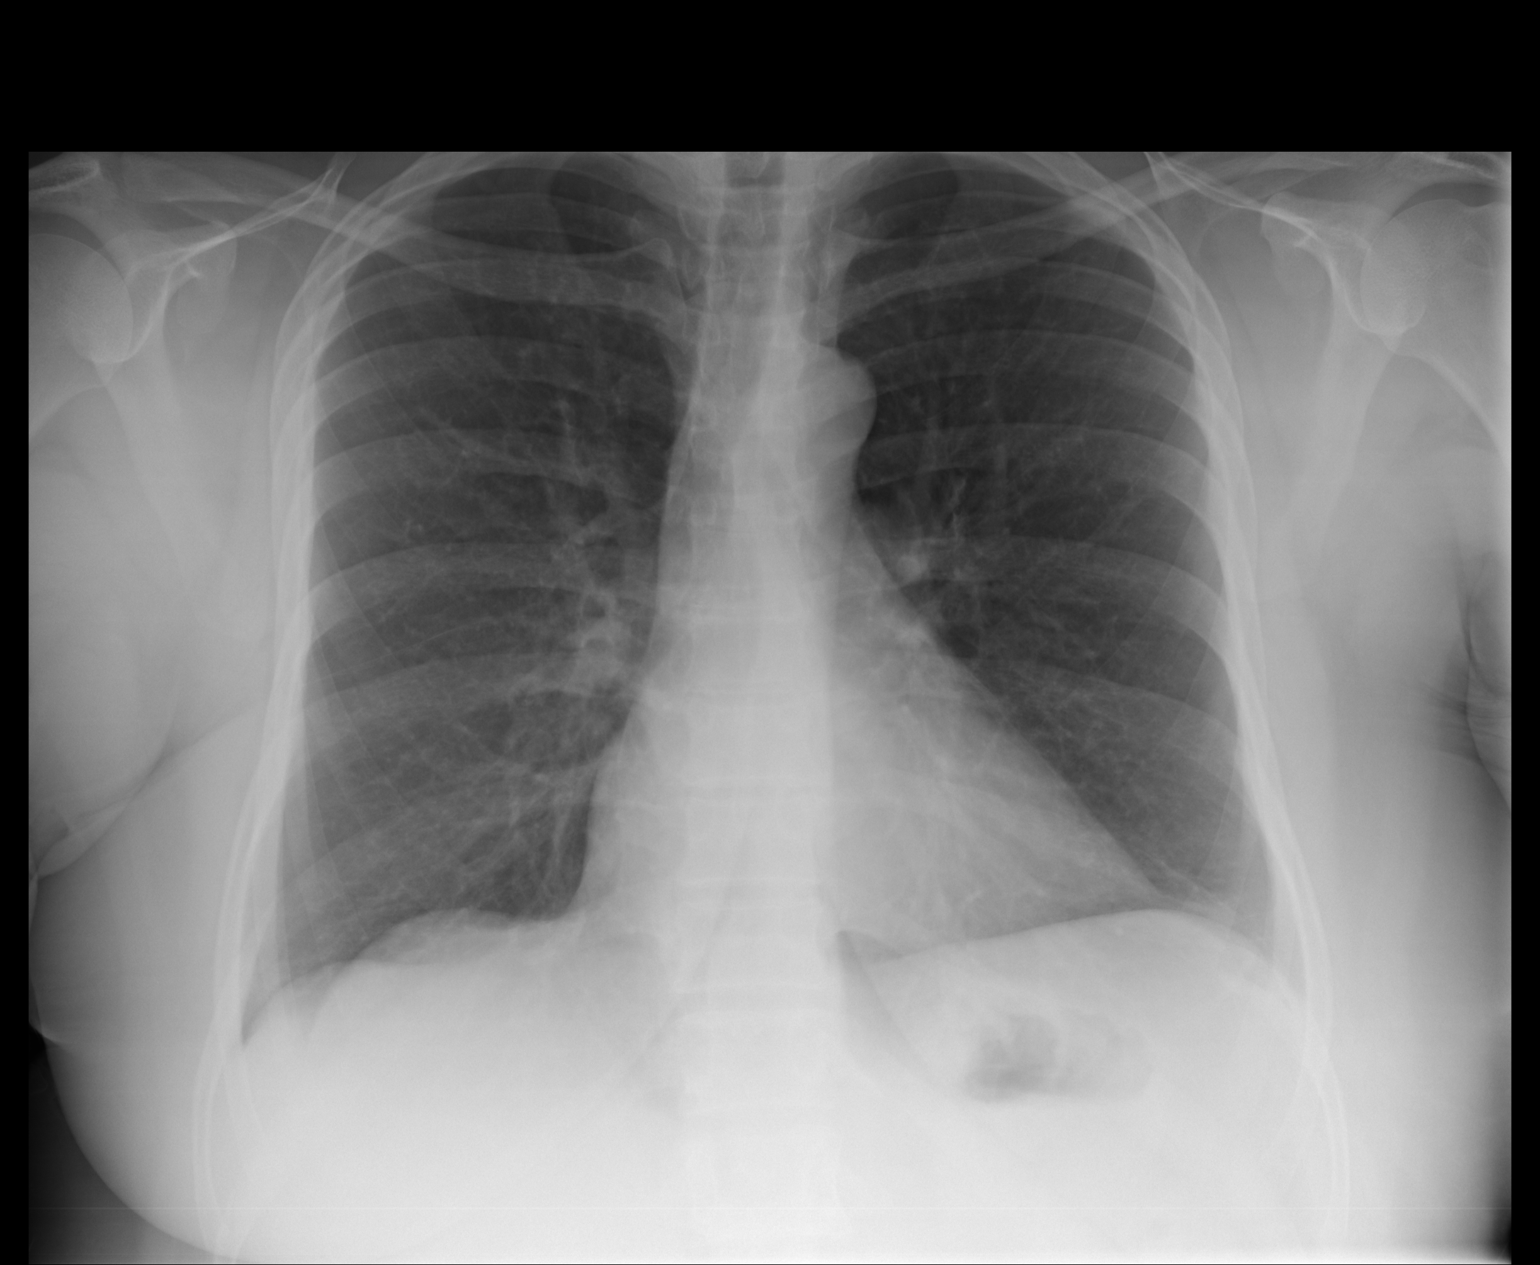
[im 2/2]
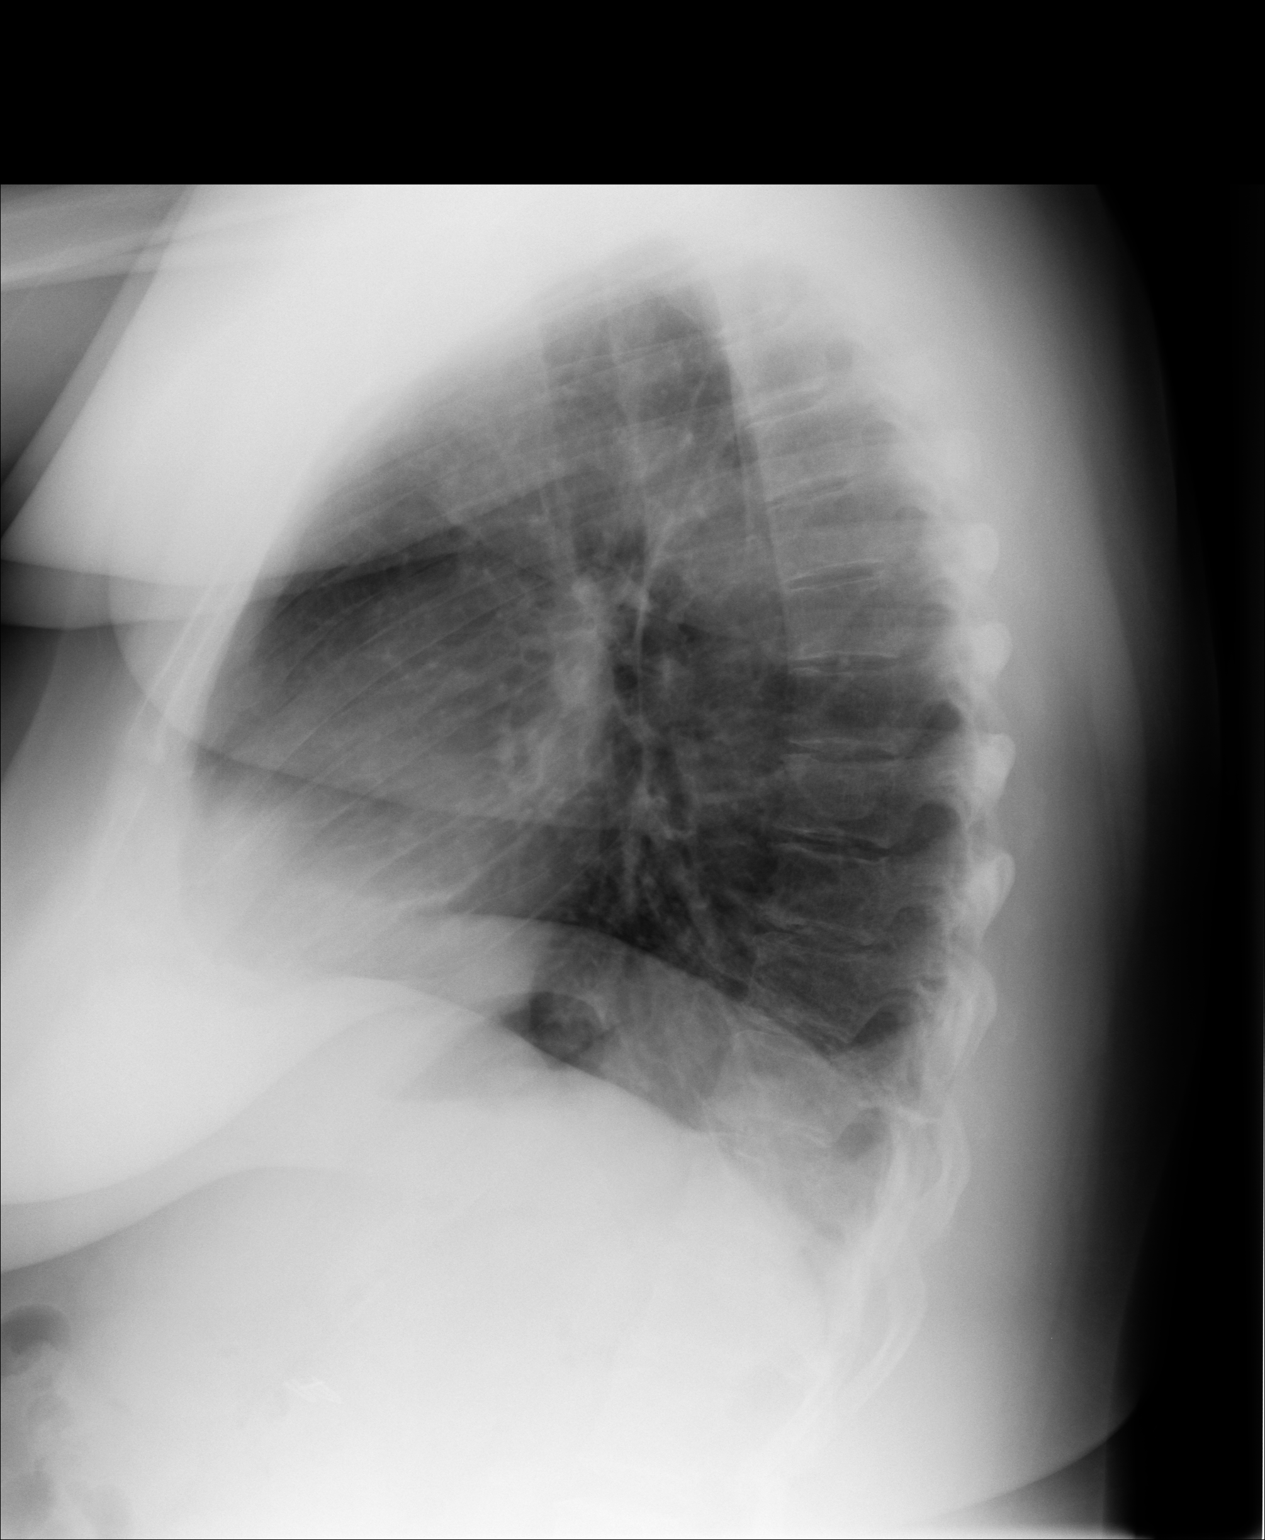

[2 of 2 positions shown; findings below may reference images not displayed]

PROCEDURE:     DXR - DXR CHEST PA (OR AP) AND LATERAL  - August 03, 2011 [DATE]

RESULT:     Comparison is made to the prior exam of 07/22/2011.

The lung fields are clear. No pneumonia, pneumothorax or pleural effusion is
seen. The heart size is normal. The mediastinal and osseous structures
reveal no significant abnormalities.
IMPRESSION: No acute changes are identified.

## 2012-12-23 ENCOUNTER — Emergency Department: Payer: Self-pay | Admitting: Emergency Medicine

## 2012-12-23 DIAGNOSIS — F17200 Nicotine dependence, unspecified, uncomplicated: Secondary | ICD-10-CM | POA: Diagnosis not present

## 2012-12-23 DIAGNOSIS — R0602 Shortness of breath: Secondary | ICD-10-CM | POA: Diagnosis not present

## 2012-12-23 DIAGNOSIS — J4489 Other specified chronic obstructive pulmonary disease: Secondary | ICD-10-CM | POA: Diagnosis not present

## 2012-12-23 DIAGNOSIS — I10 Essential (primary) hypertension: Secondary | ICD-10-CM | POA: Diagnosis not present

## 2012-12-23 DIAGNOSIS — J449 Chronic obstructive pulmonary disease, unspecified: Secondary | ICD-10-CM | POA: Diagnosis not present

## 2012-12-23 DIAGNOSIS — J45901 Unspecified asthma with (acute) exacerbation: Secondary | ICD-10-CM | POA: Diagnosis not present

## 2012-12-23 DIAGNOSIS — R6889 Other general symptoms and signs: Secondary | ICD-10-CM | POA: Diagnosis not present

## 2012-12-23 DIAGNOSIS — Z79899 Other long term (current) drug therapy: Secondary | ICD-10-CM | POA: Diagnosis not present

## 2012-12-23 DIAGNOSIS — E119 Type 2 diabetes mellitus without complications: Secondary | ICD-10-CM | POA: Diagnosis not present

## 2012-12-23 LAB — CBC
HCT: 42.4 % (ref 35.0–47.0)
HGB: 13.5 g/dL (ref 12.0–16.0)
Platelet: 366 10*3/uL (ref 150–440)
RBC: 4.85 10*6/uL (ref 3.80–5.20)
RDW: 14.2 % (ref 11.5–14.5)

## 2012-12-23 LAB — BASIC METABOLIC PANEL
Anion Gap: 7 (ref 7–16)
BUN: 21 mg/dL — ABNORMAL HIGH (ref 7–18)
Co2: 24 mmol/L (ref 21–32)
Creatinine: 1.32 mg/dL — ABNORMAL HIGH (ref 0.60–1.30)
EGFR (African American): 56 — ABNORMAL LOW
EGFR (Non-African Amer.): 48 — ABNORMAL LOW
Glucose: 86 mg/dL (ref 65–99)

## 2013-01-03 ENCOUNTER — Emergency Department: Payer: Self-pay | Admitting: Unknown Physician Specialty

## 2013-01-03 DIAGNOSIS — I10 Essential (primary) hypertension: Secondary | ICD-10-CM | POA: Diagnosis not present

## 2013-01-03 DIAGNOSIS — R05 Cough: Secondary | ICD-10-CM | POA: Diagnosis not present

## 2013-01-03 DIAGNOSIS — J40 Bronchitis, not specified as acute or chronic: Secondary | ICD-10-CM | POA: Diagnosis not present

## 2013-01-03 DIAGNOSIS — E119 Type 2 diabetes mellitus without complications: Secondary | ICD-10-CM | POA: Diagnosis not present

## 2013-01-03 DIAGNOSIS — J449 Chronic obstructive pulmonary disease, unspecified: Secondary | ICD-10-CM | POA: Diagnosis not present

## 2013-01-03 DIAGNOSIS — F17200 Nicotine dependence, unspecified, uncomplicated: Secondary | ICD-10-CM | POA: Diagnosis not present

## 2013-01-03 DIAGNOSIS — J209 Acute bronchitis, unspecified: Secondary | ICD-10-CM | POA: Diagnosis not present

## 2013-01-03 DIAGNOSIS — R0602 Shortness of breath: Secondary | ICD-10-CM | POA: Diagnosis not present

## 2013-01-03 DIAGNOSIS — Z79899 Other long term (current) drug therapy: Secondary | ICD-10-CM | POA: Diagnosis not present

## 2013-01-03 DIAGNOSIS — R6889 Other general symptoms and signs: Secondary | ICD-10-CM | POA: Diagnosis not present

## 2013-01-03 LAB — COMPREHENSIVE METABOLIC PANEL
Albumin: 3.4 g/dL (ref 3.4–5.0)
Alkaline Phosphatase: 101 U/L (ref 50–136)
BUN: 23 mg/dL — ABNORMAL HIGH (ref 7–18)
Calcium, Total: 8.6 mg/dL (ref 8.5–10.1)
Co2: 23 mmol/L (ref 21–32)
Creatinine: 1.35 mg/dL — ABNORMAL HIGH (ref 0.60–1.30)
EGFR (Non-African Amer.): 47 — ABNORMAL LOW
Glucose: 216 mg/dL — ABNORMAL HIGH (ref 65–99)
Osmolality: 280 (ref 275–301)
Potassium: 4.4 mmol/L (ref 3.5–5.1)
SGOT(AST): 22 U/L (ref 15–37)
SGPT (ALT): 65 U/L (ref 12–78)
Sodium: 135 mmol/L — ABNORMAL LOW (ref 136–145)
Total Protein: 7.1 g/dL (ref 6.4–8.2)

## 2013-01-03 LAB — CBC
HGB: 14.9 g/dL (ref 12.0–16.0)
MCH: 29.6 pg (ref 26.0–34.0)
MCHC: 33.6 g/dL (ref 32.0–36.0)
MCV: 88 fL (ref 80–100)
Platelet: 380 10*3/uL (ref 150–440)
RDW: 14.6 % — ABNORMAL HIGH (ref 11.5–14.5)
WBC: 16 10*3/uL — ABNORMAL HIGH (ref 3.6–11.0)

## 2013-01-03 LAB — URINALYSIS, COMPLETE
Bacteria: NONE SEEN
Bilirubin,UR: NEGATIVE
Glucose,UR: >=500 mg/dL (ref 0–75)
Ketone: NEGATIVE
Protein: NEGATIVE
RBC,UR: <1 /HPF (ref 0–5)
Squamous Epithelial: 3

## 2013-01-03 LAB — RAPID INFLUENZA A&B ANTIGENS

## 2013-01-03 LAB — TROPONIN I: Troponin-I: <0.02 ng/mL

## 2013-01-03 LAB — PRO B NATRIURETIC PEPTIDE: B-Type Natriuretic Peptide: 140 pg/mL — ABNORMAL HIGH (ref 0–125)

## 2013-01-16 DIAGNOSIS — J441 Chronic obstructive pulmonary disease with (acute) exacerbation: Secondary | ICD-10-CM | POA: Diagnosis not present

## 2013-01-16 DIAGNOSIS — R05 Cough: Secondary | ICD-10-CM | POA: Diagnosis not present

## 2013-01-16 DIAGNOSIS — F17200 Nicotine dependence, unspecified, uncomplicated: Secondary | ICD-10-CM | POA: Diagnosis not present

## 2013-01-26 IMAGING — CR DG CHEST 2V
1 series · 2 of 2 positions shown · non-contrast
Comparison: none

REASON FOR EXAM: [place] cough/wheezuing
COMMENTS:

[Series 1: w chest pa · 0.14mm/px · 2 of 2 slices shown]
[im 1/2]
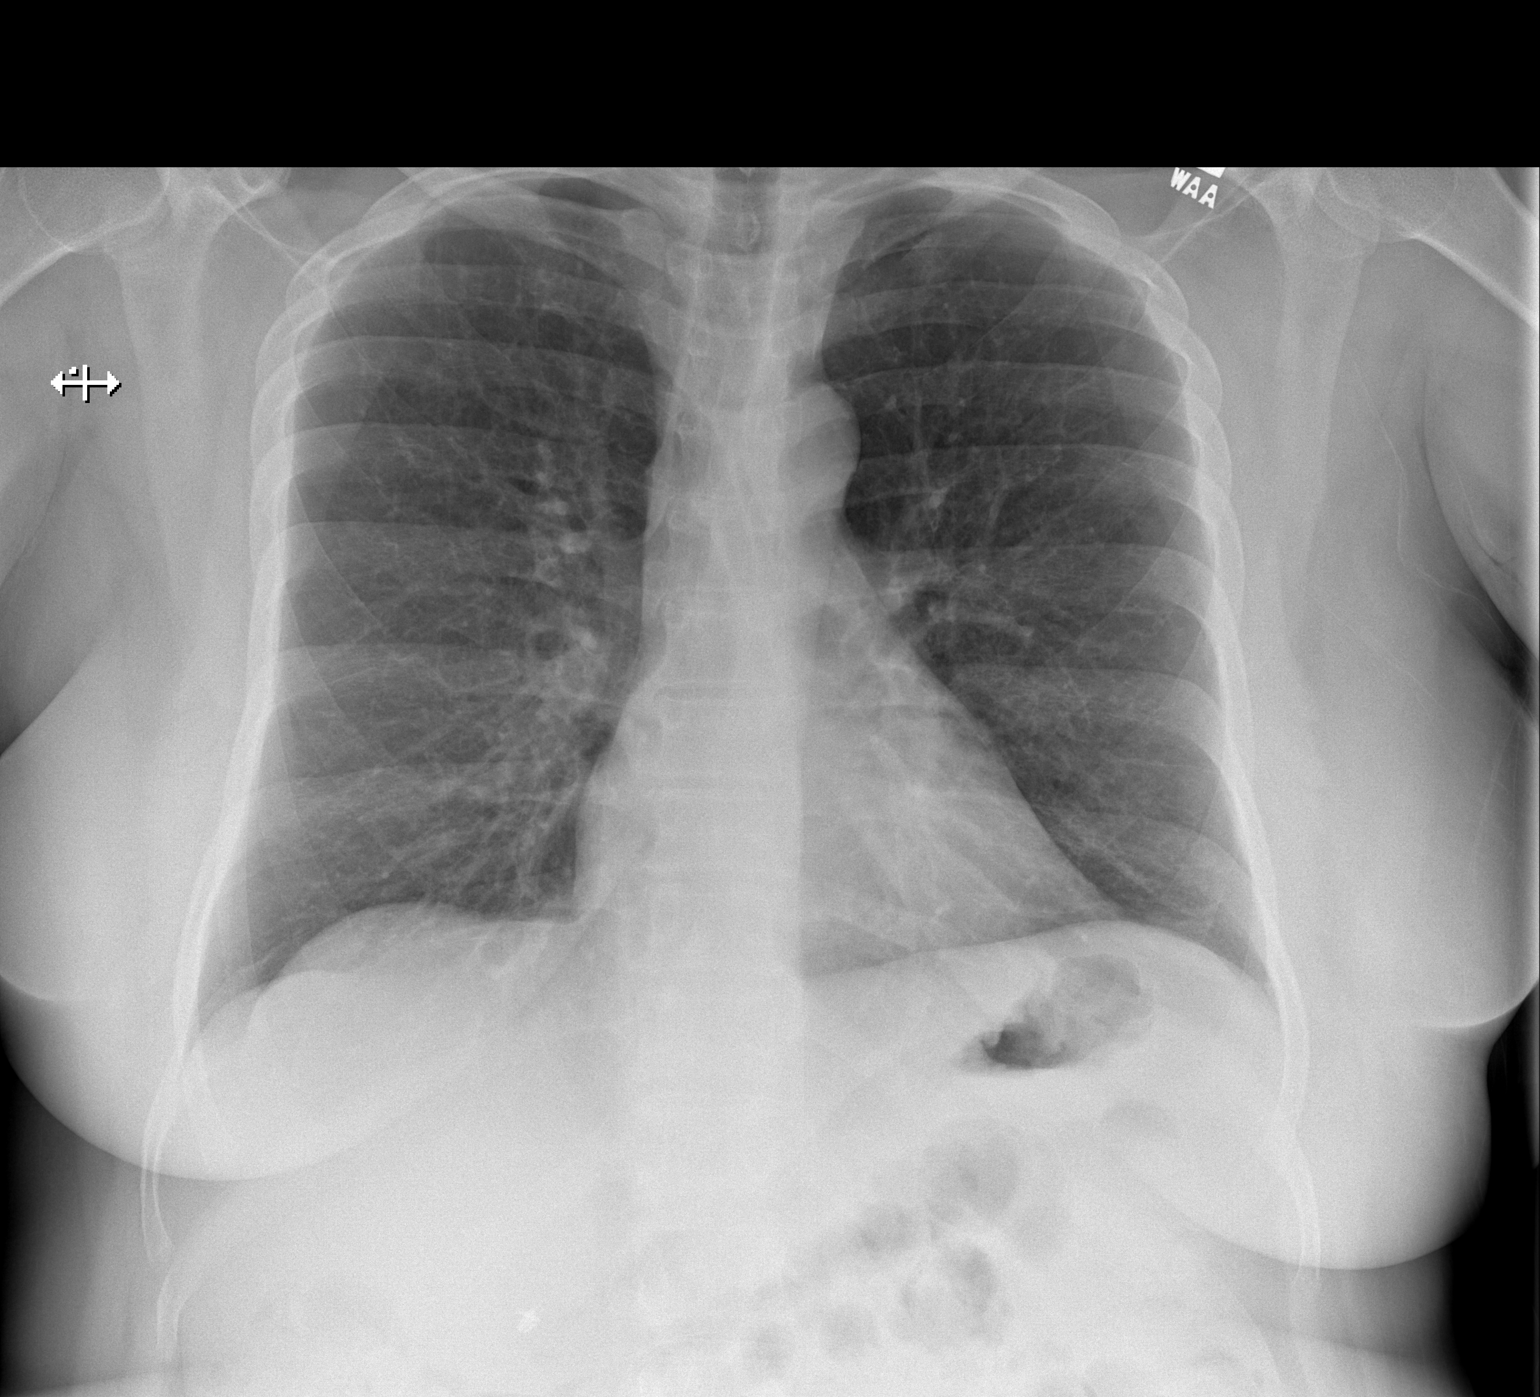
[im 2/2]
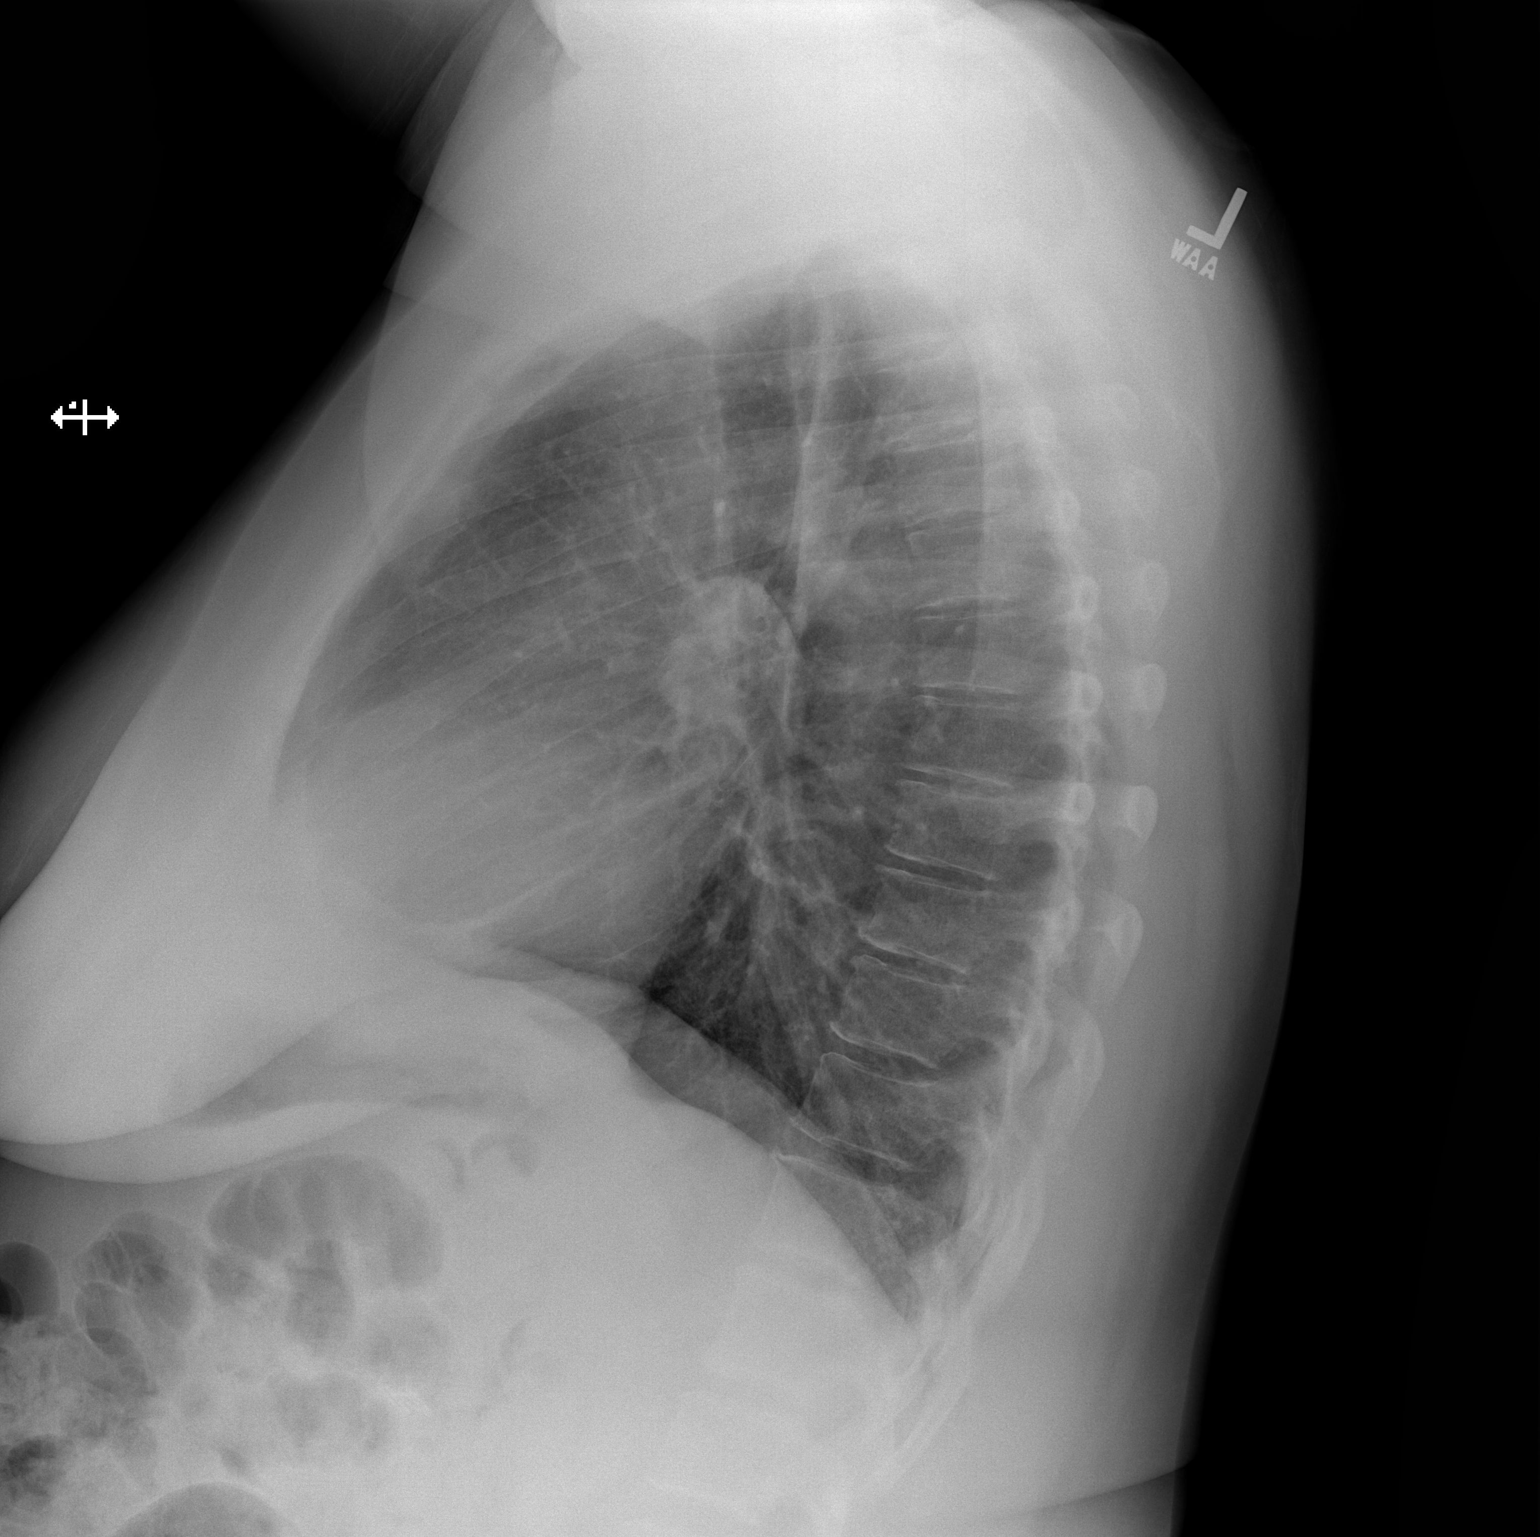

[2 of 2 positions shown; findings below may reference images not displayed]

PROCEDURE:     DXR - DXR CHEST PA (OR AP) AND LATERAL  - September 27, 2011  [DATE]

RESULT:     Comparison is made to the prior exam of 08/03/2011. The lung
fields are clear. No pneumonia, pneumothorax or pleural effusion is seen.
Heart size is normal. The mediastinal and osseous structures show no
significant abnormalities.
IMPRESSION: No significant abnormalities are noted.

## 2013-01-31 ENCOUNTER — Emergency Department: Payer: Self-pay | Admitting: Emergency Medicine

## 2013-01-31 DIAGNOSIS — F17200 Nicotine dependence, unspecified, uncomplicated: Secondary | ICD-10-CM | POA: Diagnosis not present

## 2013-01-31 DIAGNOSIS — J441 Chronic obstructive pulmonary disease with (acute) exacerbation: Secondary | ICD-10-CM | POA: Diagnosis not present

## 2013-01-31 DIAGNOSIS — R0602 Shortness of breath: Secondary | ICD-10-CM | POA: Diagnosis not present

## 2013-01-31 DIAGNOSIS — R6889 Other general symptoms and signs: Secondary | ICD-10-CM | POA: Diagnosis not present

## 2013-01-31 LAB — CBC
HCT: 42.8 % (ref 35.0–47.0)
HGB: 13.7 g/dL (ref 12.0–16.0)
MCH: 28.6 pg (ref 26.0–34.0)
MCHC: 32.1 g/dL (ref 32.0–36.0)
MCV: 89 fL (ref 80–100)
Platelet: 340 10*3/uL (ref 150–440)
RBC: 4.79 10*6/uL (ref 3.80–5.20)

## 2013-01-31 LAB — COMPREHENSIVE METABOLIC PANEL
Albumin: 3 g/dL — ABNORMAL LOW (ref 3.4–5.0)
Bilirubin,Total: 0.2 mg/dL (ref 0.2–1.0)
Calcium, Total: 8.7 mg/dL (ref 8.5–10.1)
Chloride: 104 mmol/L (ref 98–107)
Co2: 27 mmol/L (ref 21–32)
EGFR (Non-African Amer.): 44 — ABNORMAL LOW
Osmolality: 282 (ref 275–301)
Potassium: 3.9 mmol/L (ref 3.5–5.1)
SGOT(AST): 21 U/L (ref 15–37)
SGPT (ALT): 35 U/L (ref 12–78)
Total Protein: 6.4 g/dL (ref 6.4–8.2)

## 2013-01-31 LAB — PRO B NATRIURETIC PEPTIDE: B-Type Natriuretic Peptide: 152 pg/mL — ABNORMAL HIGH (ref 0–125)

## 2013-01-31 LAB — CK TOTAL AND CKMB (NOT AT ARMC)
CK, Total: 97 U/L (ref 21–215)
CK-MB: 0.8 ng/mL (ref 0.5–3.6)

## 2013-01-31 LAB — TROPONIN I: Troponin-I: <0.02 ng/mL

## 2013-01-31 IMAGING — CR DG CHEST 1V PORT
1 series · 1 of 1 positions shown · non-contrast
Comparison: none

REASON FOR EXAM: breathing difficulty  - rm7
COMMENTS:

PROCEDURE:     DXR - DXR PORTABLE CHEST SINGLE VIEW  - October 02, 2011  [DATE]
RESULT:     Comparison: 09/27/2011

[view not recorded]
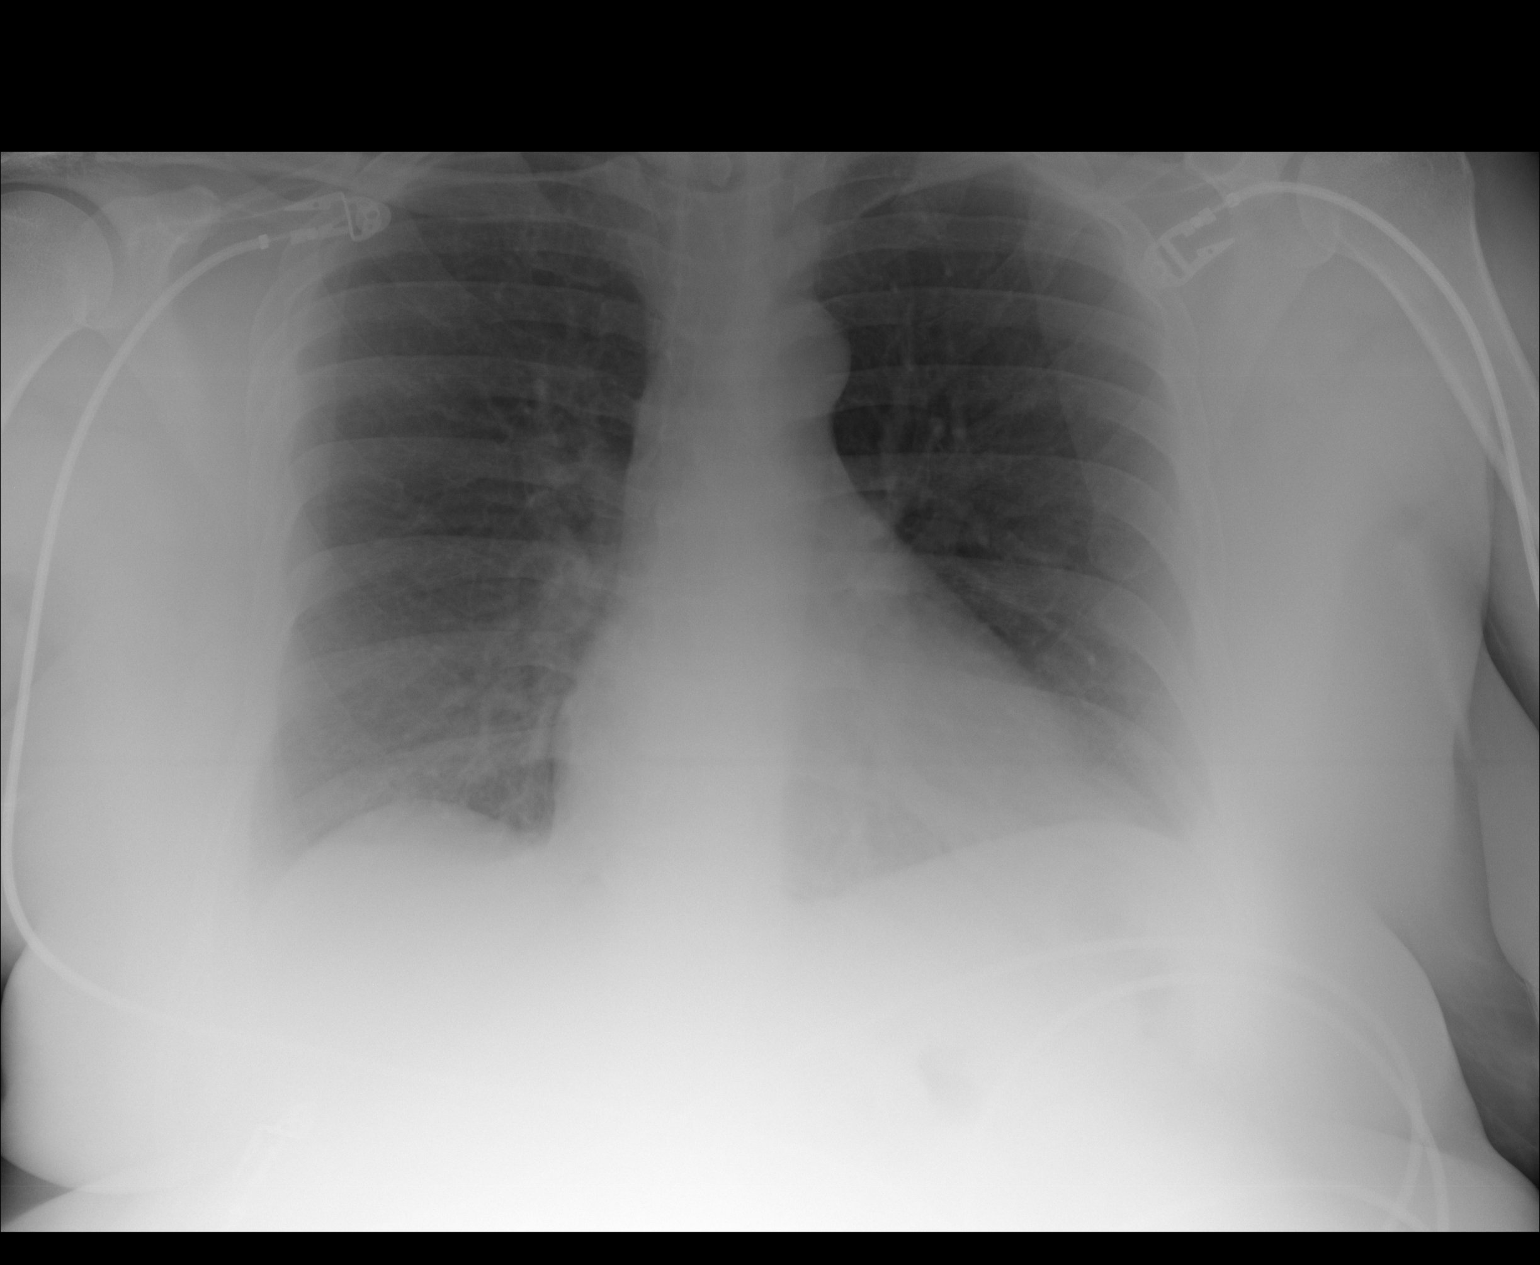

[1 of 1 positions shown; findings below may reference images not displayed]

FINDINGS: The heart and mediastinum are stable. No focal pulmonary opacities. Slight
increased density of the mid and lower lungs is felt to be secondary to
overlying soft tissue.
IMPRESSION: No acute cardiopulmonary disease.

## 2013-02-11 ENCOUNTER — Emergency Department: Payer: Self-pay | Admitting: Emergency Medicine

## 2013-02-26 IMAGING — CR DG CHEST 2V
1 series · 2 of 2 positions shown · non-contrast
Comparison: none

REASON FOR EXAM: COUGH, shortness of breath
COMMENTS:   May transport without cardiac monitor

PROCEDURE:     DXR - DXR CHEST PA (OR AP) AND LATERAL  - October 28, 2011  [DATE]
RESULT:     Comparison is made to the prior exam of 10/02/2011. The lung
fields are clear. The heart, mediastinal and osseous structures show no
significant abnormalities.

[Series 3: w chest pa · 0.14mm/px · 2 of 2 slices shown]
[im 1/2]
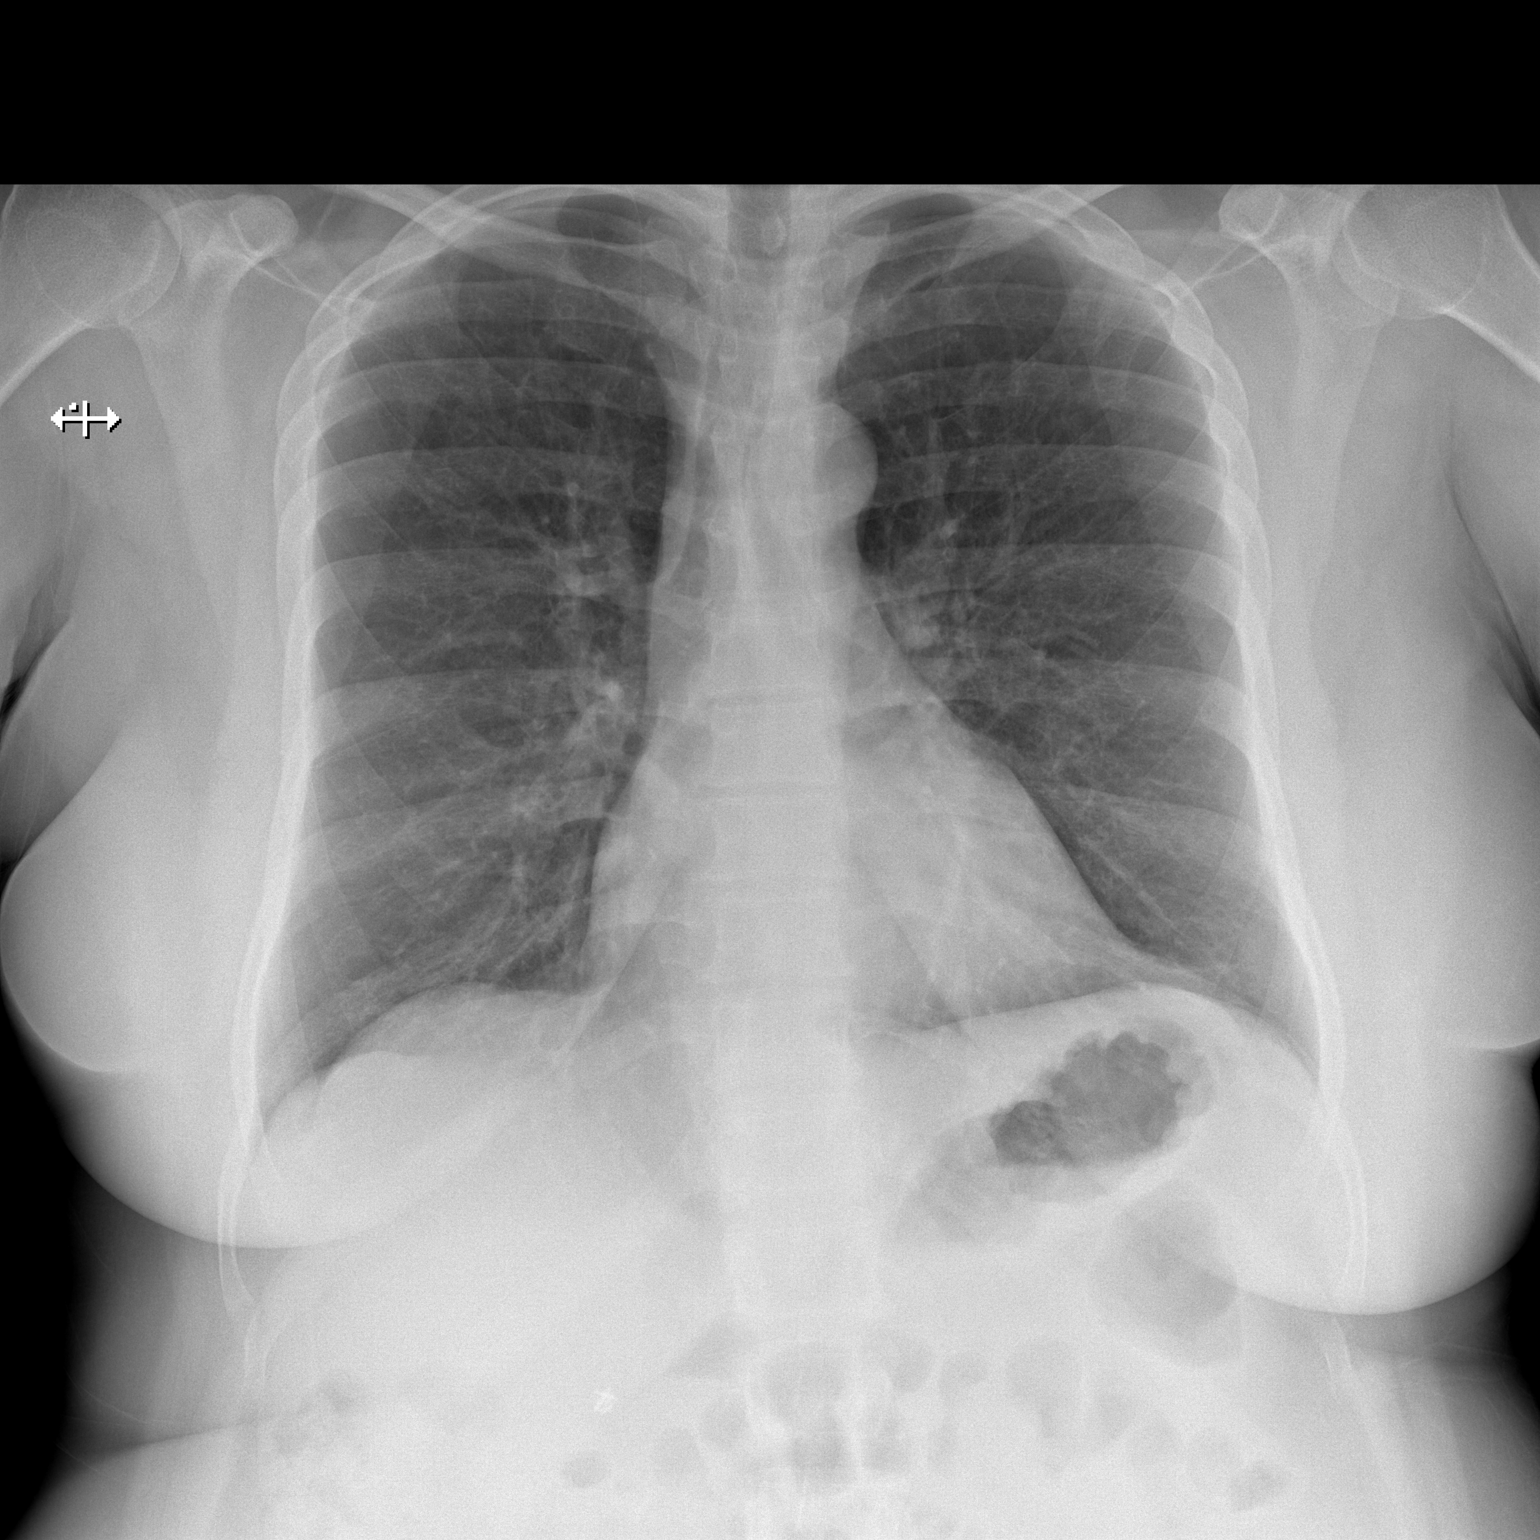
[im 2/2]
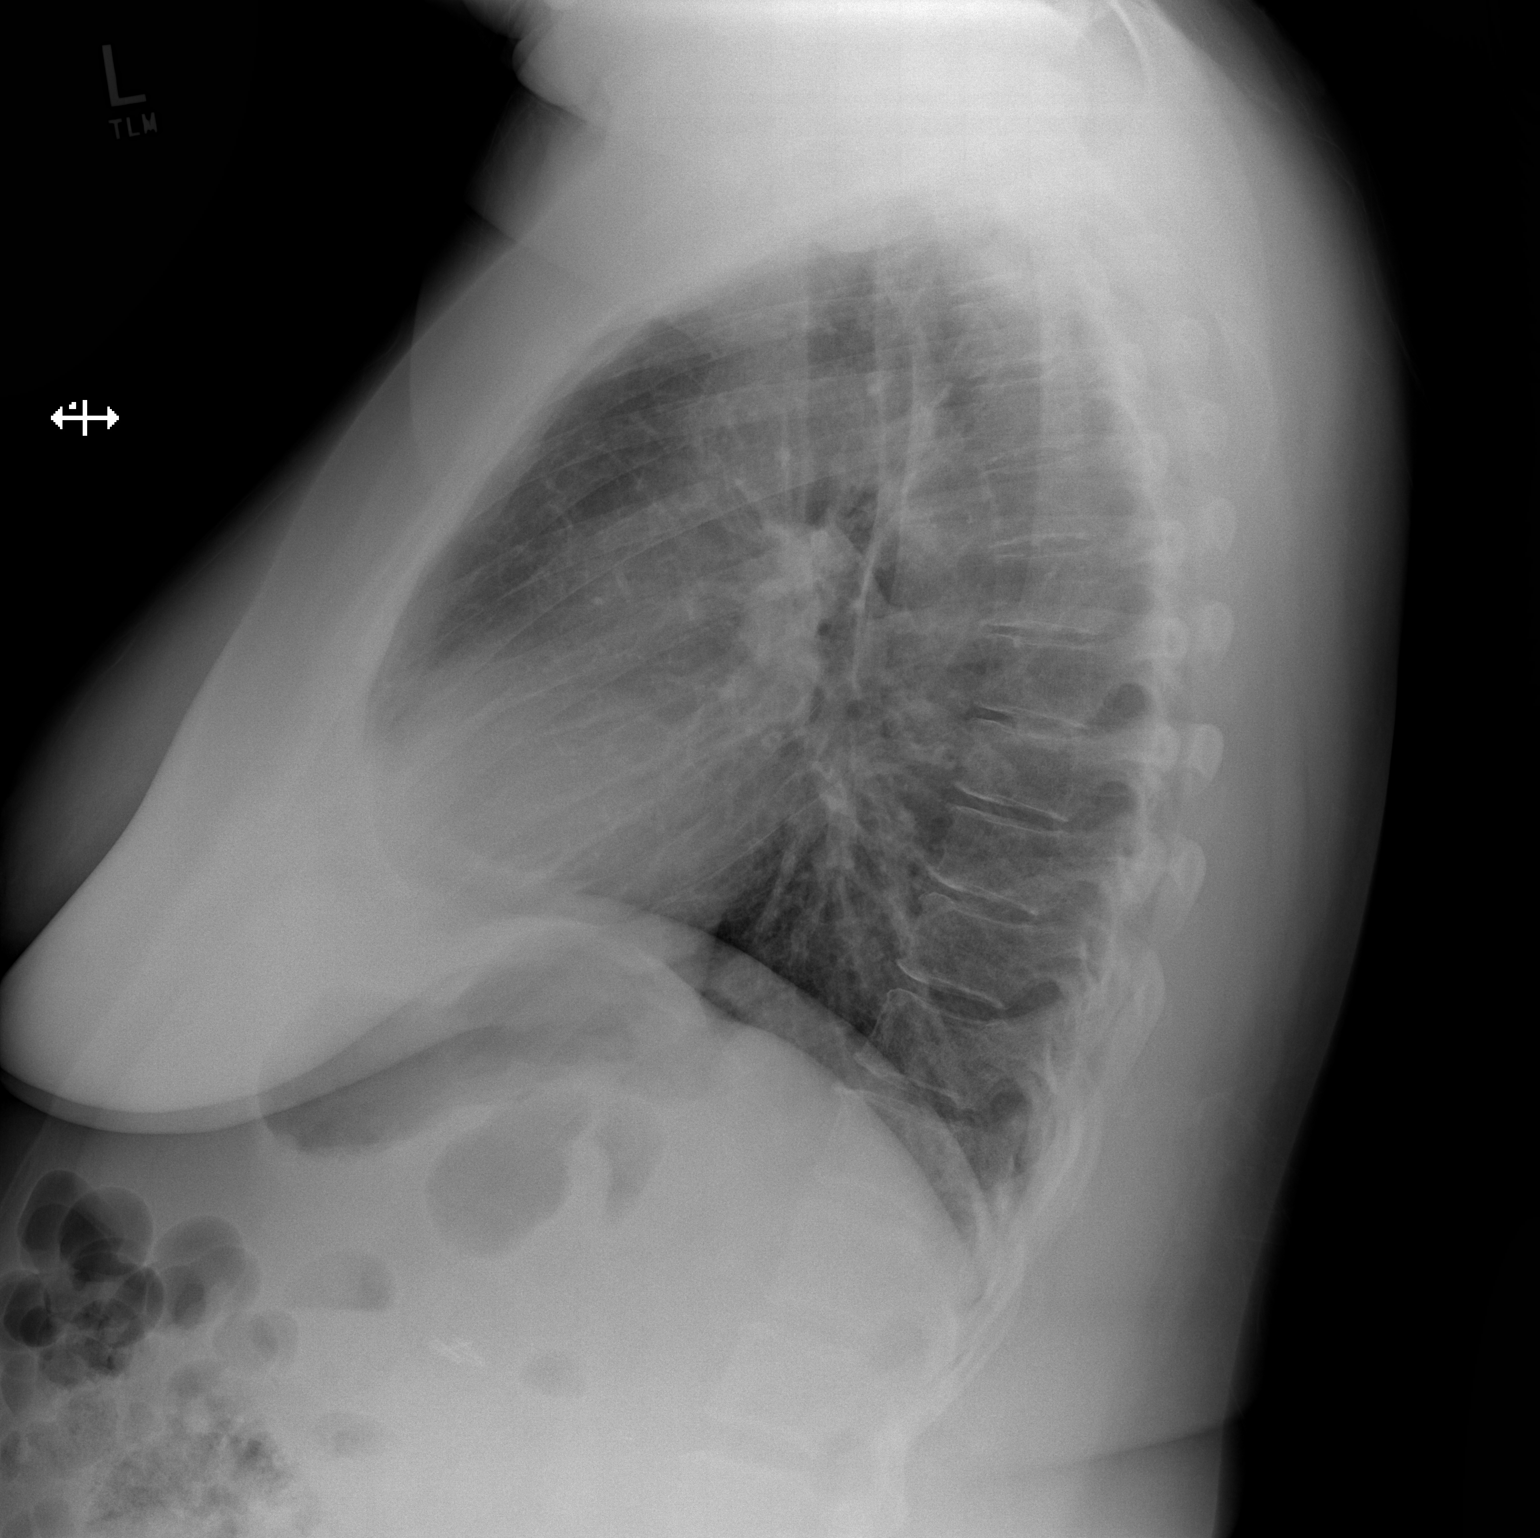

[2 of 2 positions shown; findings below may reference images not displayed]

IMPRESSION: 1.     No significant abnormalities are noted.

## 2013-03-07 IMAGING — CR DG CHEST 2V
1 series · 2 of 2 positions shown · non-contrast
Comparison: none

REASON FOR EXAM: Shortness of Breath
COMMENTS:   May transport without cardiac monitor

[Series 1: view not recorded · 0.17mm/px · 2 of 2 slices shown]
[im 1/2]
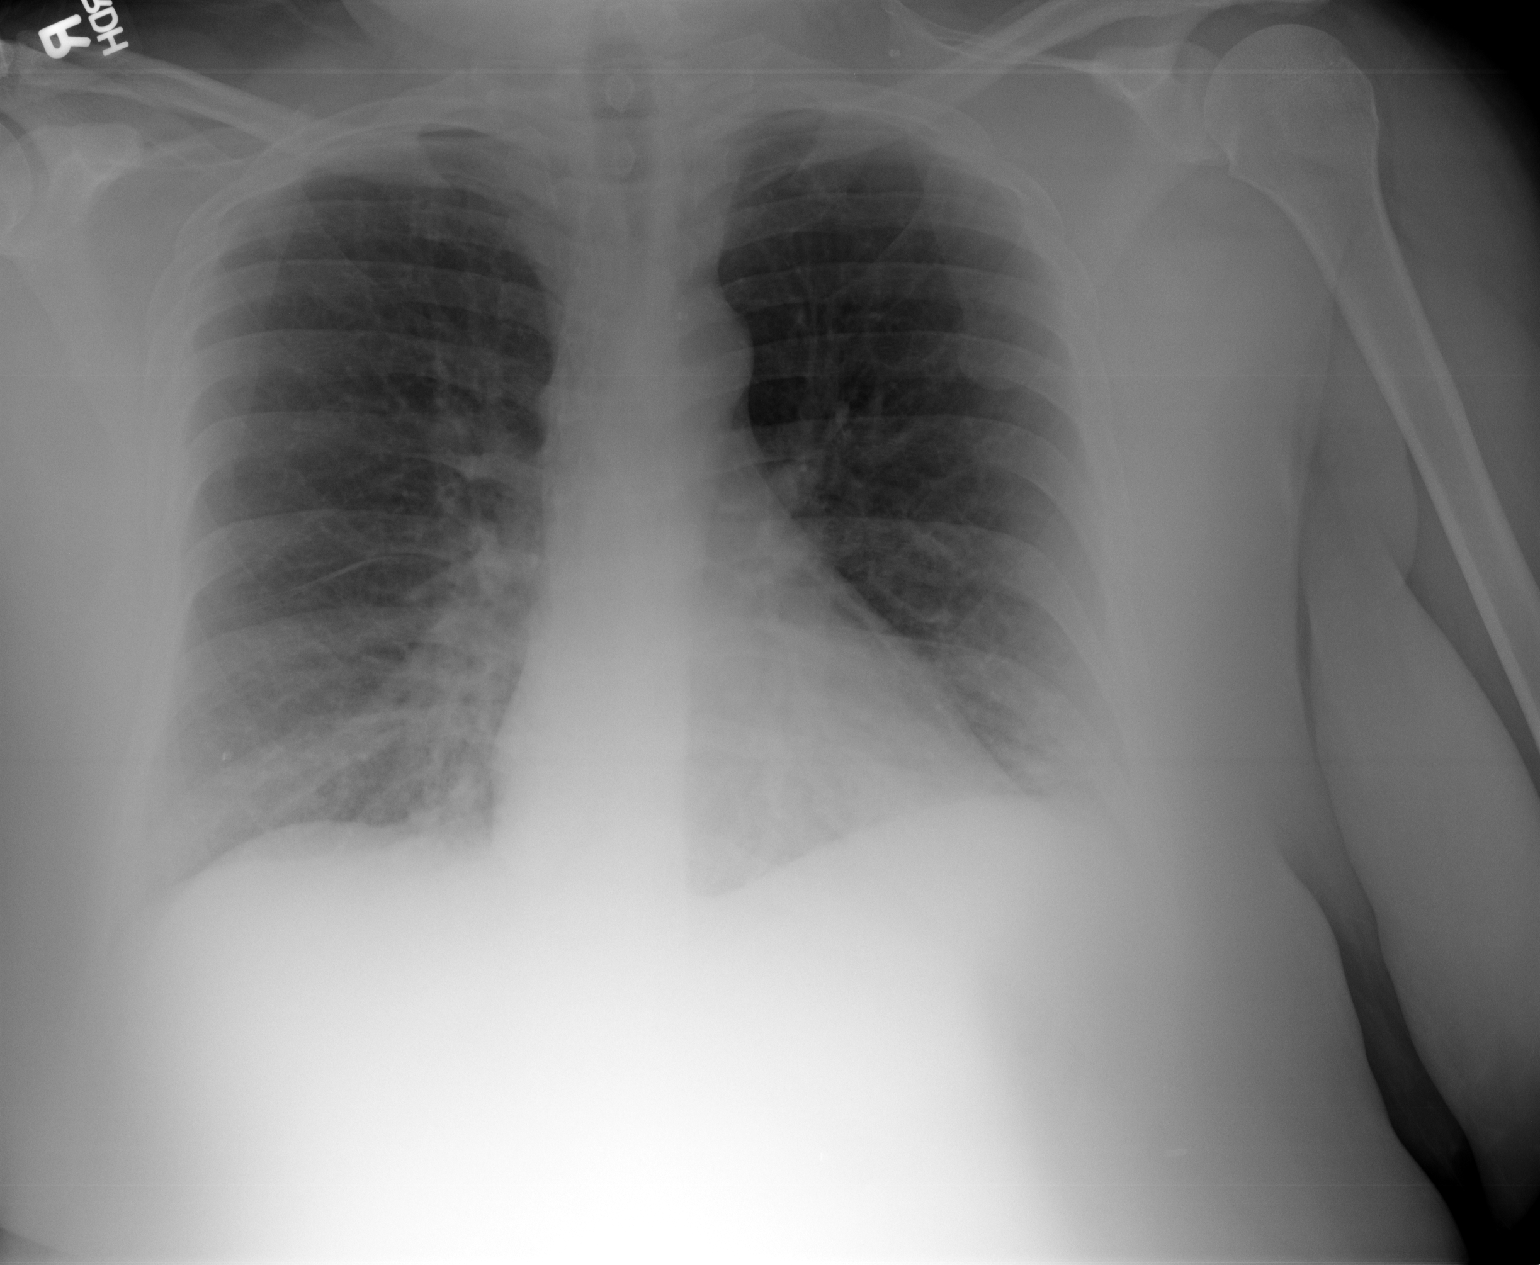
[im 2/2]
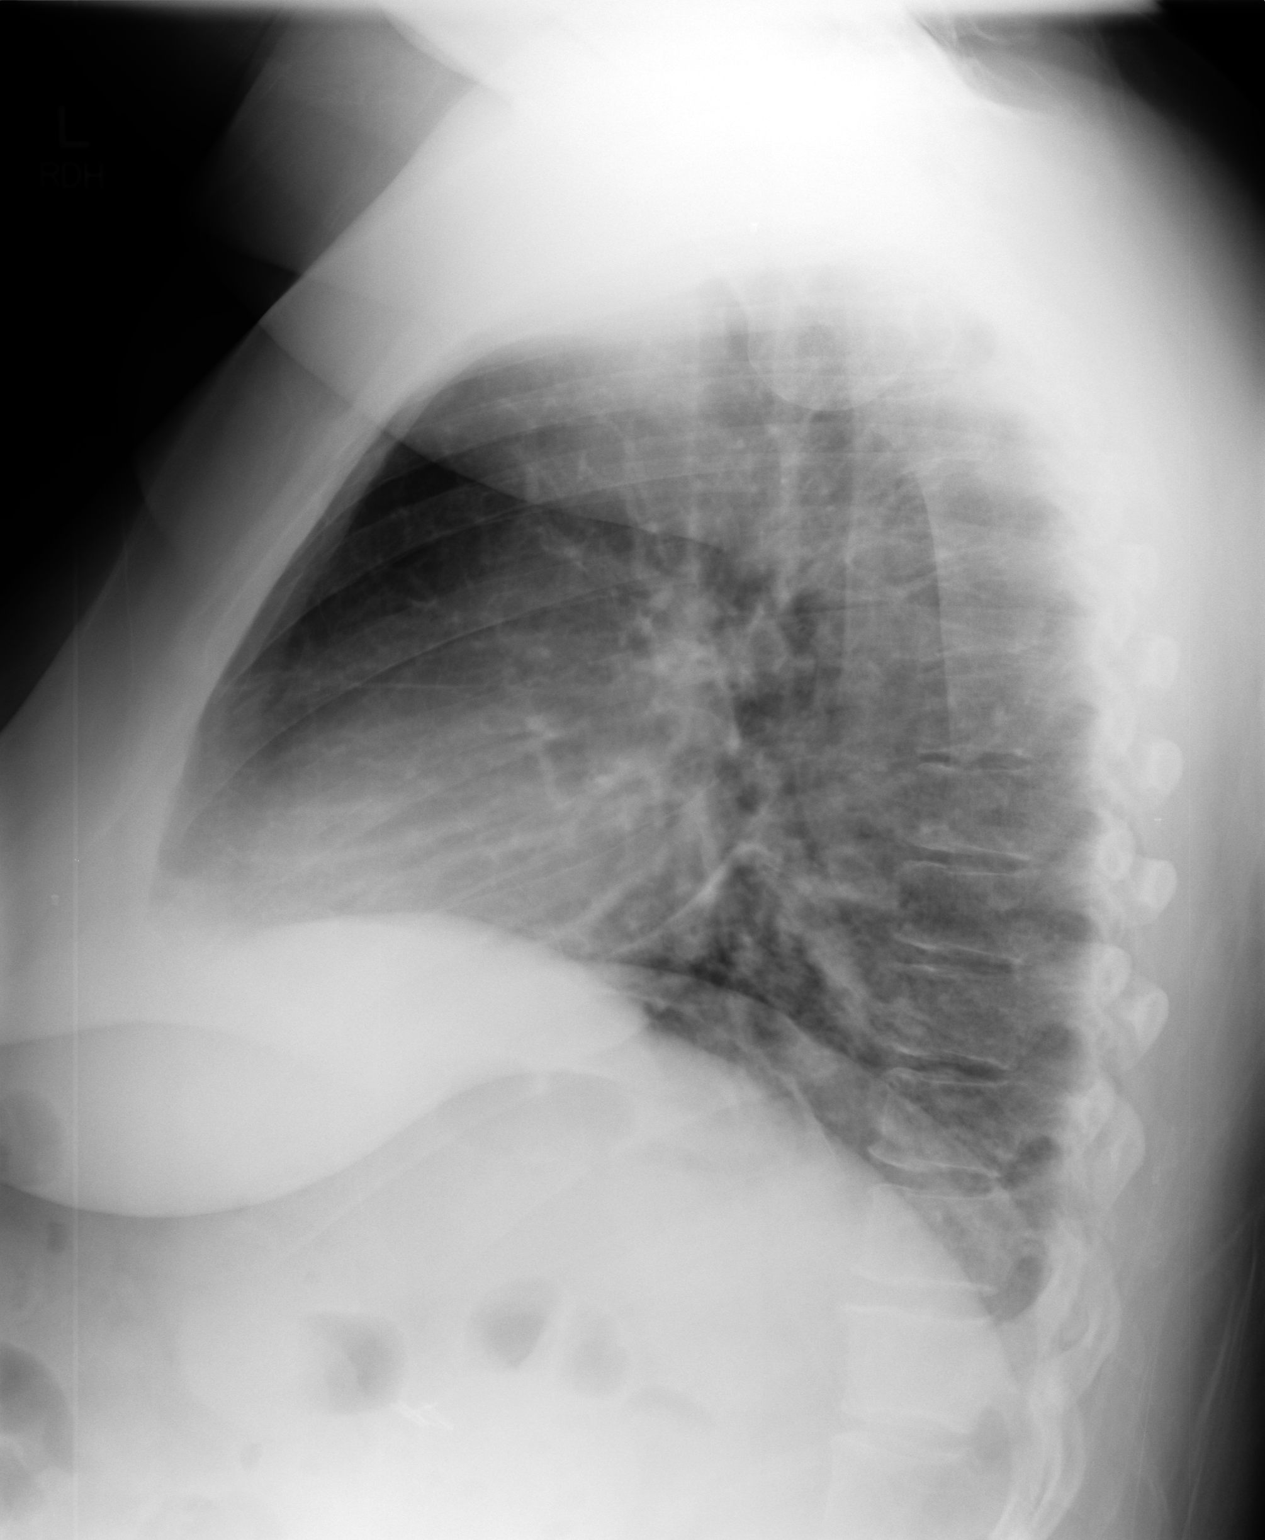

[2 of 2 positions shown; findings below may reference images not displayed]

PROCEDURE:     DXR - DXR CHEST PA (OR AP) AND LATERAL  - November 06, 2011 [DATE]

RESULT:     Comparison is made to the study 28 October, 2011.

The lungs are clear. The heart and pulmonary vessels are normal. The bony
and mediastinal structures are unremarkable. There is no effusion. There is
no pneumothorax or evidence of congestive failure.
IMPRESSION: No acute cardiopulmonary disease.

## 2013-03-07 IMAGING — US US EXTREM LOW VENOUS BILAT
1 series · 17 of 24 positions shown · non-contrast
Comparison: none

REASON FOR EXAM: bilat leg pain
COMMENTS:

[Series 1: us extrem low venous bilat · 17 of 56 slices shown]
[im 1/56]
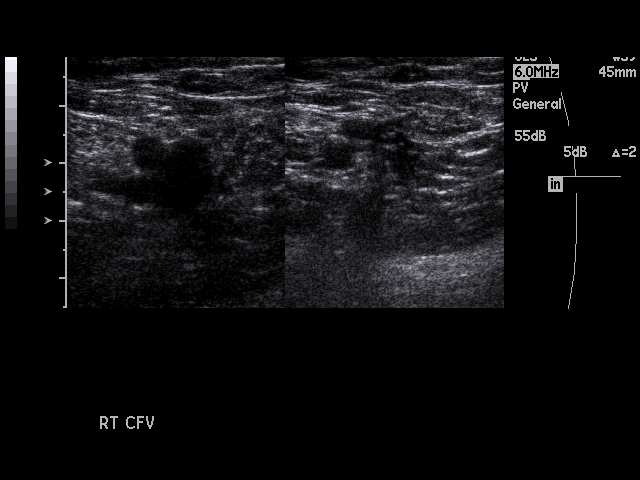
[im 5/56]
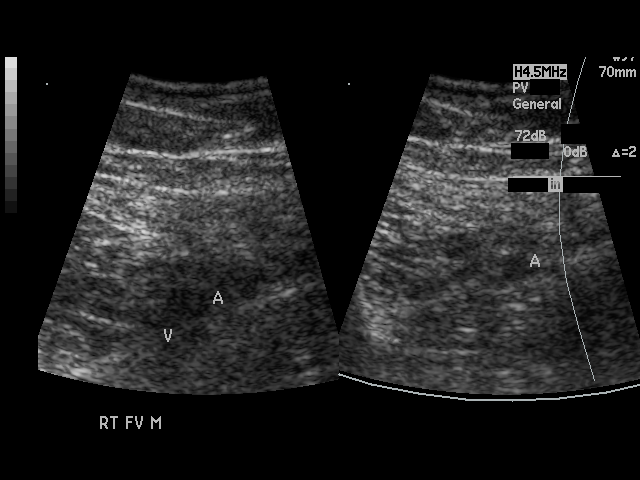
[im 8/56]
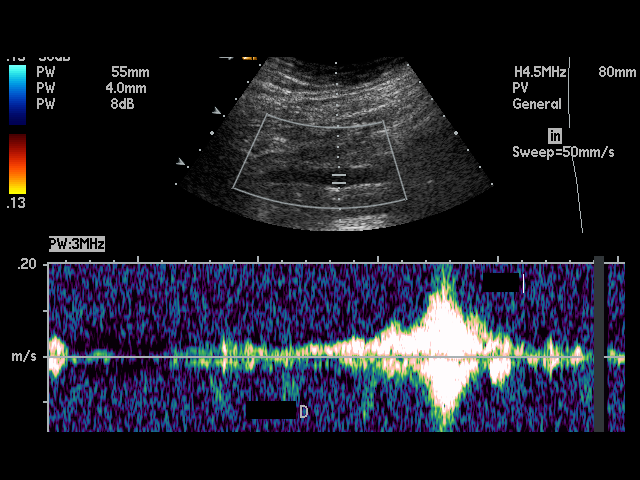
[im 10/56]
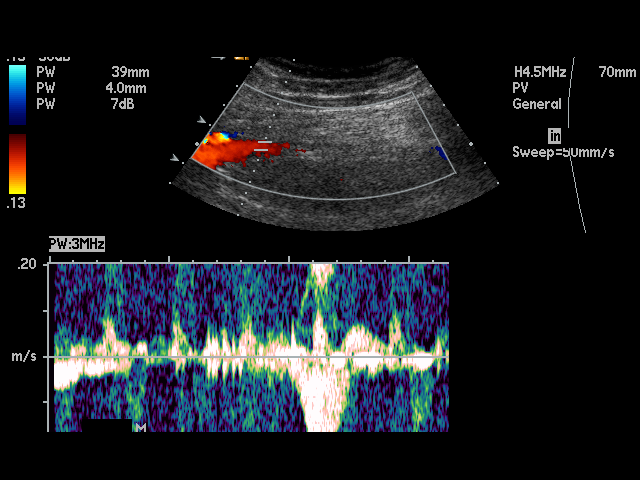
[im 15/56]
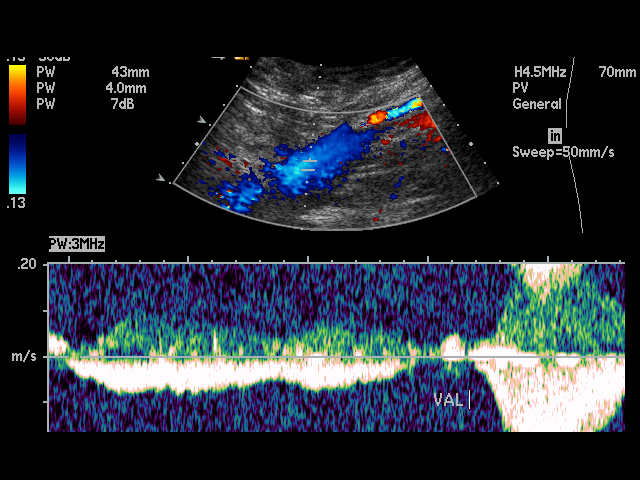
[im 17/56]
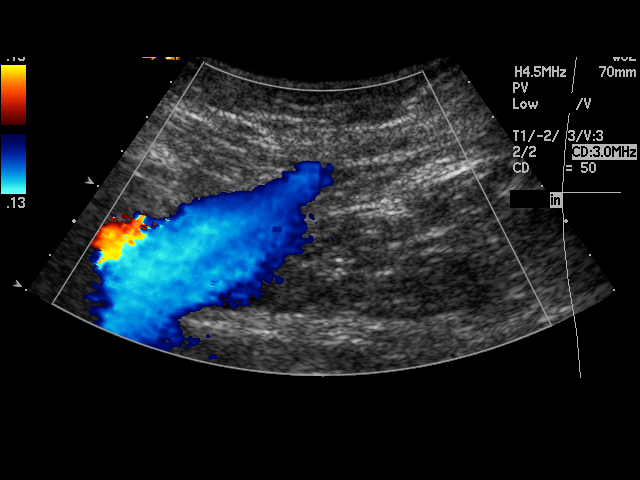
[im 22/56]
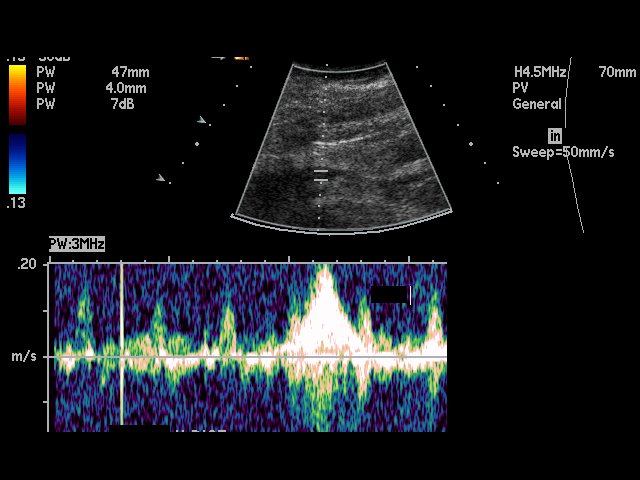
[im 24/56]
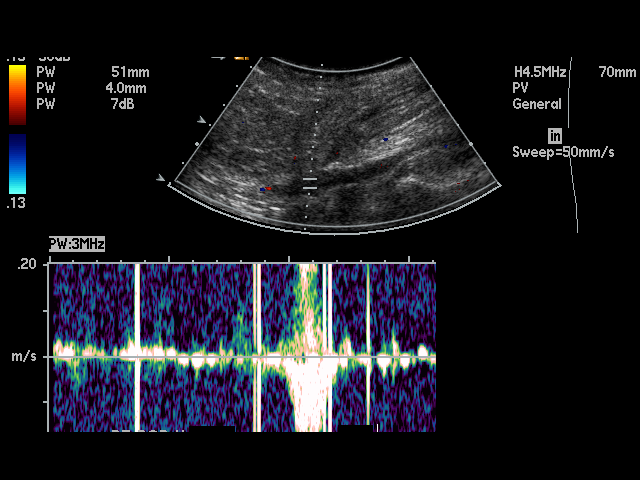
[im 29/56]
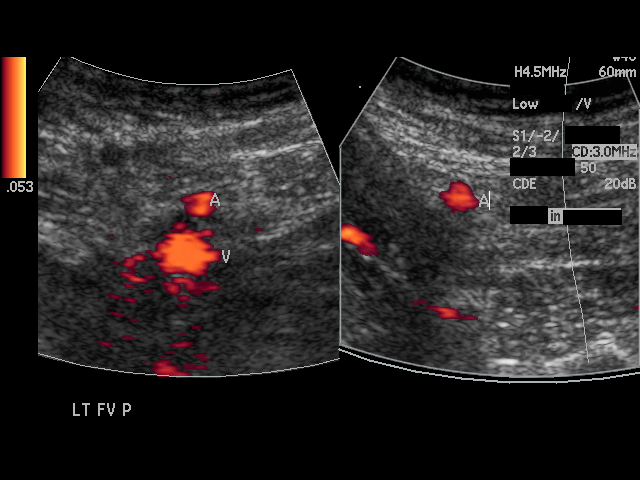
[im 32/56]
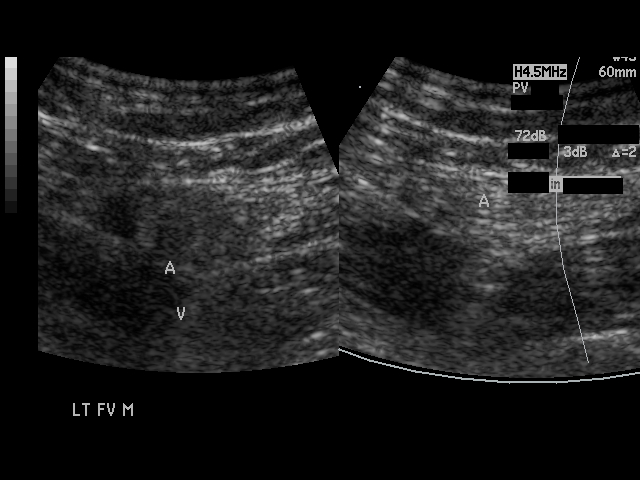
[im 34/56]
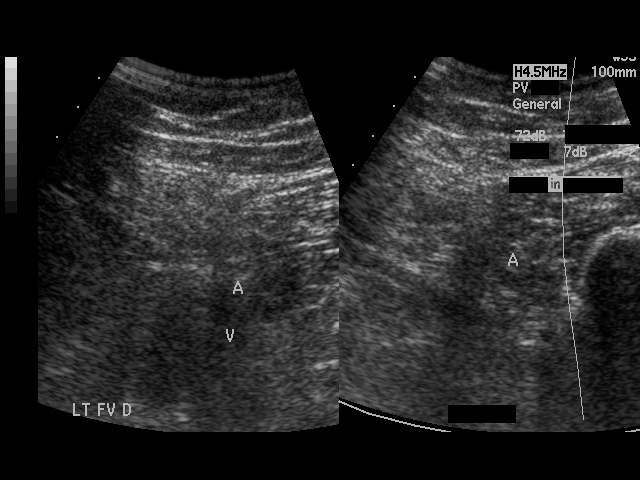
[im 39/56]
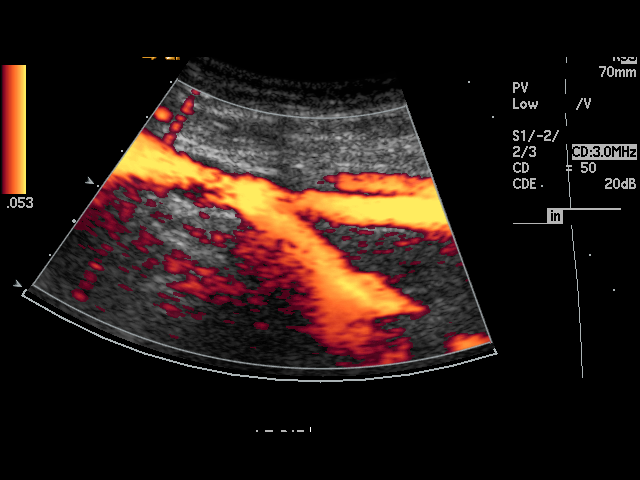
[im 41/56]
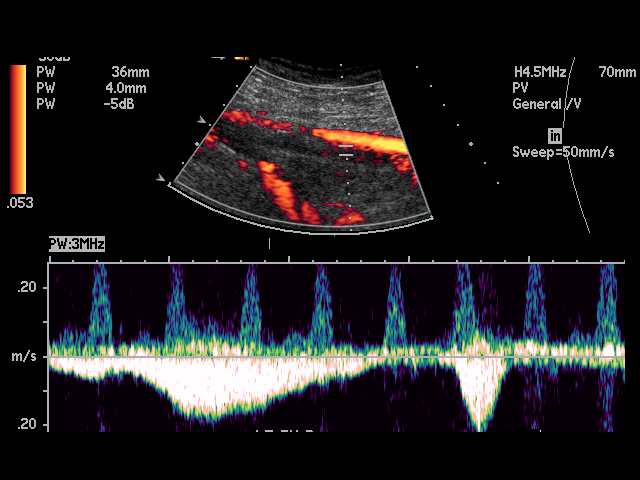
[im 46/56]
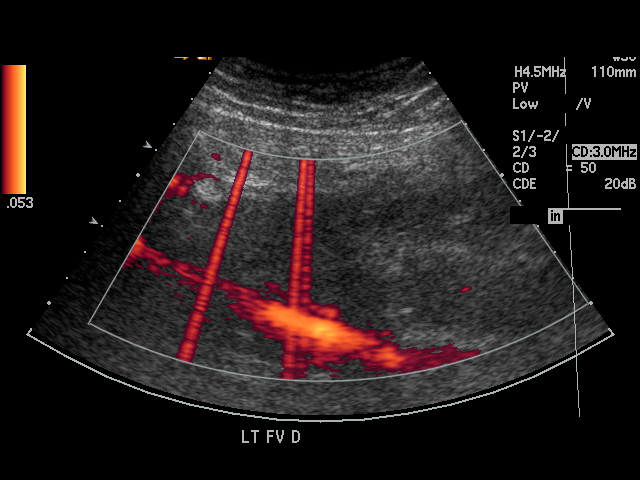
[im 48/56]
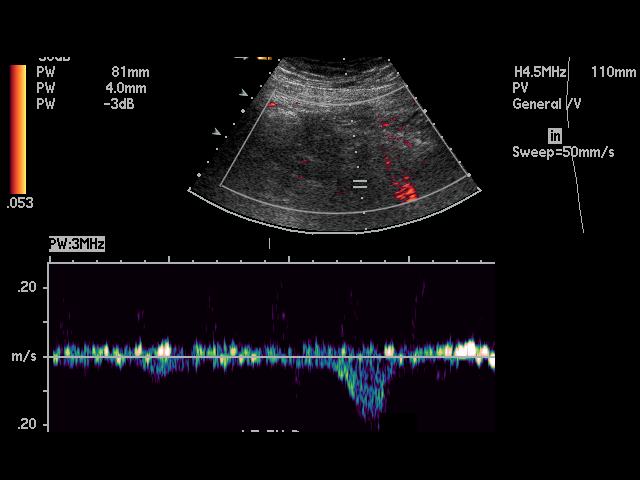
[im 51/56]
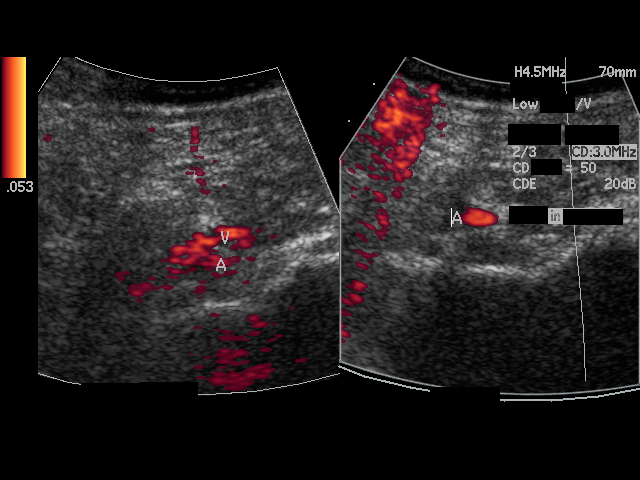
[im 56/56]
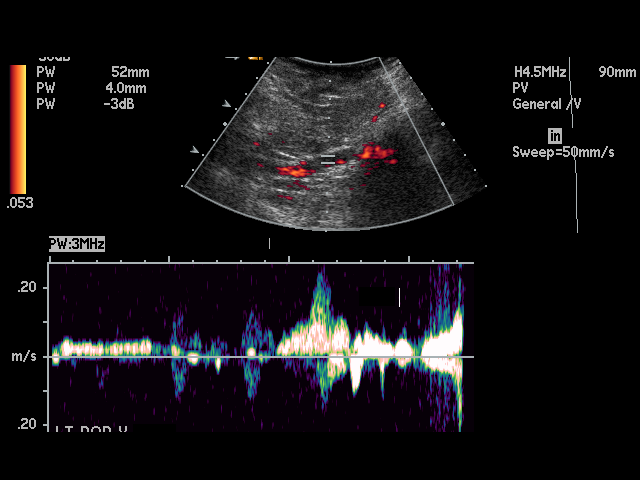

[17 of 24 positions shown; findings below may reference images not displayed]

PROCEDURE:     US  - US DOPPLER LOW EXTR BILATERAL  - November 06, 2011  [DATE]

RESULT:     Duplex Doppler interrogation of the deep venous system of both
legs from the inguinal to the popliteal region demonstrates the deep venous
systems are fully compressible throughout. The color Doppler and spectral
Doppler appearance is normal. There is normal response to distal
augmentation. The color Doppler images show no filling defect.
IMPRESSION: 1. No evidence of DVT in either lower extremity.

## 2013-03-09 ENCOUNTER — Emergency Department: Payer: Self-pay | Admitting: Internal Medicine

## 2013-03-09 DIAGNOSIS — J449 Chronic obstructive pulmonary disease, unspecified: Secondary | ICD-10-CM | POA: Diagnosis not present

## 2013-03-09 DIAGNOSIS — J45901 Unspecified asthma with (acute) exacerbation: Secondary | ICD-10-CM | POA: Diagnosis not present

## 2013-03-09 DIAGNOSIS — J441 Chronic obstructive pulmonary disease with (acute) exacerbation: Secondary | ICD-10-CM | POA: Diagnosis not present

## 2013-03-09 DIAGNOSIS — J209 Acute bronchitis, unspecified: Secondary | ICD-10-CM | POA: Diagnosis not present

## 2013-03-09 DIAGNOSIS — R0602 Shortness of breath: Secondary | ICD-10-CM | POA: Diagnosis not present

## 2013-03-09 DIAGNOSIS — I10 Essential (primary) hypertension: Secondary | ICD-10-CM | POA: Diagnosis not present

## 2013-03-09 DIAGNOSIS — E119 Type 2 diabetes mellitus without complications: Secondary | ICD-10-CM | POA: Diagnosis not present

## 2013-03-09 DIAGNOSIS — Z79899 Other long term (current) drug therapy: Secondary | ICD-10-CM | POA: Diagnosis not present

## 2013-03-09 LAB — CBC
HCT: 42.5 % (ref 35.0–47.0)
HGB: 14 g/dL (ref 12.0–16.0)
MCH: 29.2 pg (ref 26.0–34.0)
MCHC: 33 g/dL (ref 32.0–36.0)
MCV: 89 fL (ref 80–100)
Platelet: 374 10*3/uL (ref 150–440)
RBC: 4.79 10*6/uL (ref 3.80–5.20)
RDW: 14.5 % (ref 11.5–14.5)

## 2013-03-09 LAB — COMPREHENSIVE METABOLIC PANEL
Albumin: 3.4 g/dL (ref 3.4–5.0)
BUN: 14 mg/dL (ref 7–18)
Bilirubin,Total: 0.4 mg/dL (ref 0.2–1.0)
Calcium, Total: 9.1 mg/dL (ref 8.5–10.1)
Chloride: 108 mmol/L — ABNORMAL HIGH (ref 98–107)
Co2: 22 mmol/L (ref 21–32)
Creatinine: 1.33 mg/dL — ABNORMAL HIGH (ref 0.60–1.30)
EGFR (Non-African Amer.): 47 — ABNORMAL LOW
Glucose: 140 mg/dL — ABNORMAL HIGH (ref 65–99)
Osmolality: 275 (ref 275–301)
SGOT(AST): 26 U/L (ref 15–37)
Sodium: 136 mmol/L (ref 136–145)

## 2013-04-02 DIAGNOSIS — B370 Candidal stomatitis: Secondary | ICD-10-CM | POA: Diagnosis not present

## 2013-04-02 DIAGNOSIS — I10 Essential (primary) hypertension: Secondary | ICD-10-CM | POA: Diagnosis not present

## 2013-04-02 DIAGNOSIS — F17200 Nicotine dependence, unspecified, uncomplicated: Secondary | ICD-10-CM | POA: Diagnosis not present

## 2013-04-02 DIAGNOSIS — J301 Allergic rhinitis due to pollen: Secondary | ICD-10-CM | POA: Diagnosis not present

## 2013-04-02 DIAGNOSIS — J0100 Acute maxillary sinusitis, unspecified: Secondary | ICD-10-CM | POA: Diagnosis not present

## 2013-04-02 DIAGNOSIS — R0602 Shortness of breath: Secondary | ICD-10-CM | POA: Diagnosis not present

## 2013-04-02 DIAGNOSIS — E119 Type 2 diabetes mellitus without complications: Secondary | ICD-10-CM | POA: Diagnosis not present

## 2013-04-02 DIAGNOSIS — J441 Chronic obstructive pulmonary disease with (acute) exacerbation: Secondary | ICD-10-CM | POA: Diagnosis not present

## 2013-05-26 ENCOUNTER — Emergency Department: Payer: Self-pay | Admitting: Internal Medicine

## 2013-05-26 DIAGNOSIS — J45909 Unspecified asthma, uncomplicated: Secondary | ICD-10-CM | POA: Diagnosis not present

## 2013-05-26 DIAGNOSIS — F17200 Nicotine dependence, unspecified, uncomplicated: Secondary | ICD-10-CM | POA: Diagnosis not present

## 2013-05-26 DIAGNOSIS — R6889 Other general symptoms and signs: Secondary | ICD-10-CM | POA: Diagnosis not present

## 2013-05-26 DIAGNOSIS — R0602 Shortness of breath: Secondary | ICD-10-CM | POA: Diagnosis not present

## 2013-05-26 DIAGNOSIS — J189 Pneumonia, unspecified organism: Secondary | ICD-10-CM | POA: Diagnosis not present

## 2013-05-26 DIAGNOSIS — Z79899 Other long term (current) drug therapy: Secondary | ICD-10-CM | POA: Diagnosis not present

## 2013-05-26 DIAGNOSIS — J45901 Unspecified asthma with (acute) exacerbation: Secondary | ICD-10-CM | POA: Diagnosis not present

## 2013-05-26 DIAGNOSIS — Z888 Allergy status to other drugs, medicaments and biological substances status: Secondary | ICD-10-CM | POA: Diagnosis not present

## 2013-05-26 LAB — BASIC METABOLIC PANEL
Anion Gap: 8 (ref 7–16)
Calcium, Total: 9.3 mg/dL (ref 8.5–10.1)
Chloride: 110 mmol/L — ABNORMAL HIGH (ref 98–107)
Co2: 22 mmol/L (ref 21–32)
EGFR (African American): 46 — ABNORMAL LOW
EGFR (Non-African Amer.): 39 — ABNORMAL LOW
Glucose: 100 mg/dL — ABNORMAL HIGH (ref 65–99)

## 2013-05-26 LAB — CBC
HCT: 45 % (ref 35.0–47.0)
Platelet: 368 10*3/uL (ref 150–440)
RBC: 5.18 10*6/uL (ref 3.80–5.20)
RDW: 14.4 % (ref 11.5–14.5)

## 2013-06-03 IMAGING — CR DG CHEST 1V PORT
1 series · 1 of 1 positions shown · non-contrast
Comparison: none

REASON FOR EXAM: shortness of breath
COMMENTS:

PROCEDURE:     DXR - DXR PORTABLE CHEST SINGLE VIEW  - February 02, 2012 [DATE]
RESULT:     Comparison is made to the prior exam of 11/06/2011. The lung
fields are clear. The heart, mediastinal and osseous structures show no
significant abnormalities.

[portable]
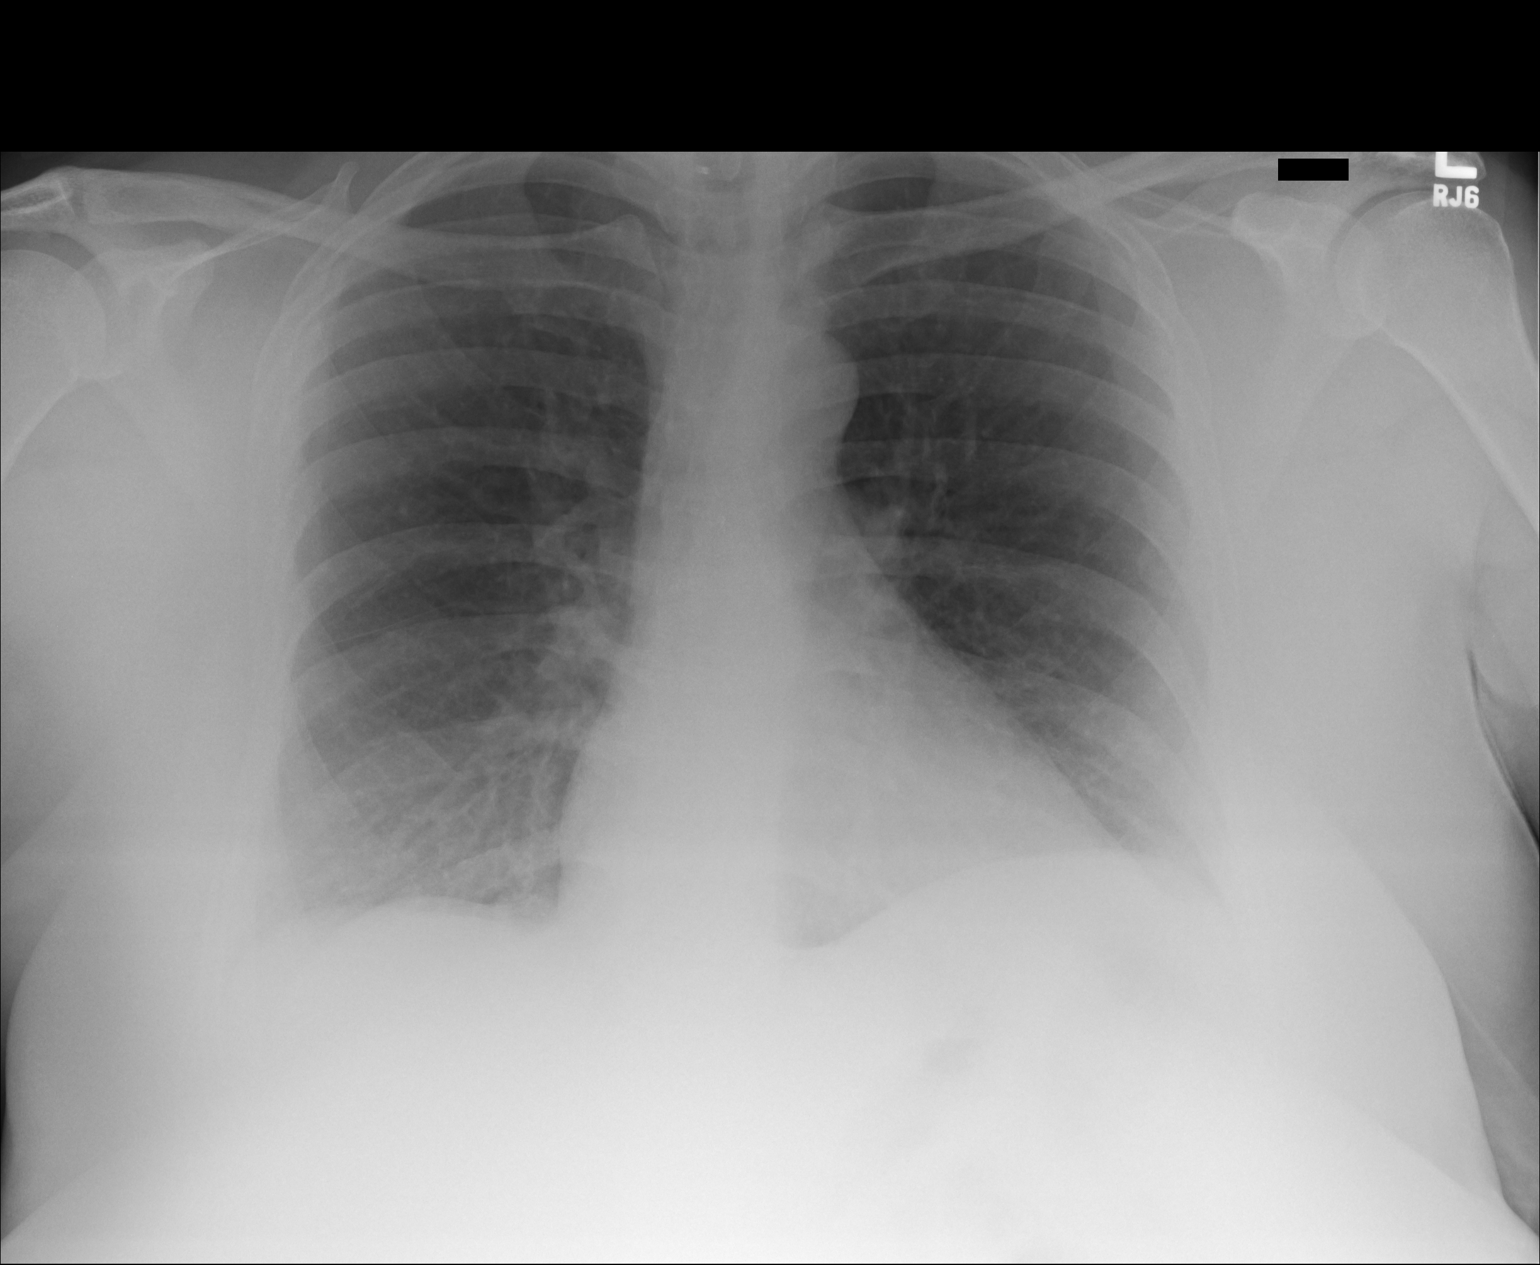

[1 of 1 positions shown; findings below may reference images not displayed]

IMPRESSION: 1. No acute changes are identified.

## 2013-06-22 IMAGING — CR DG CHEST 2V
1 series · 2 of 2 positions shown · non-contrast
Comparison: none

REASON FOR EXAM: cough
COMMENTS:

PROCEDURE:     DXR - DXR CHEST PA (OR AP) AND LATERAL  - February 21, 2012 [DATE]
RESULT:
Comparison is made to the prior exam of 02/02/2012.
The lung fields are clear. The heart, mediastinal and osseous structures
reveal no significant abnormalities.

[Series 1: w chest pa · 0.14mm/px · 2 of 2 slices shown]
[im 1/2]
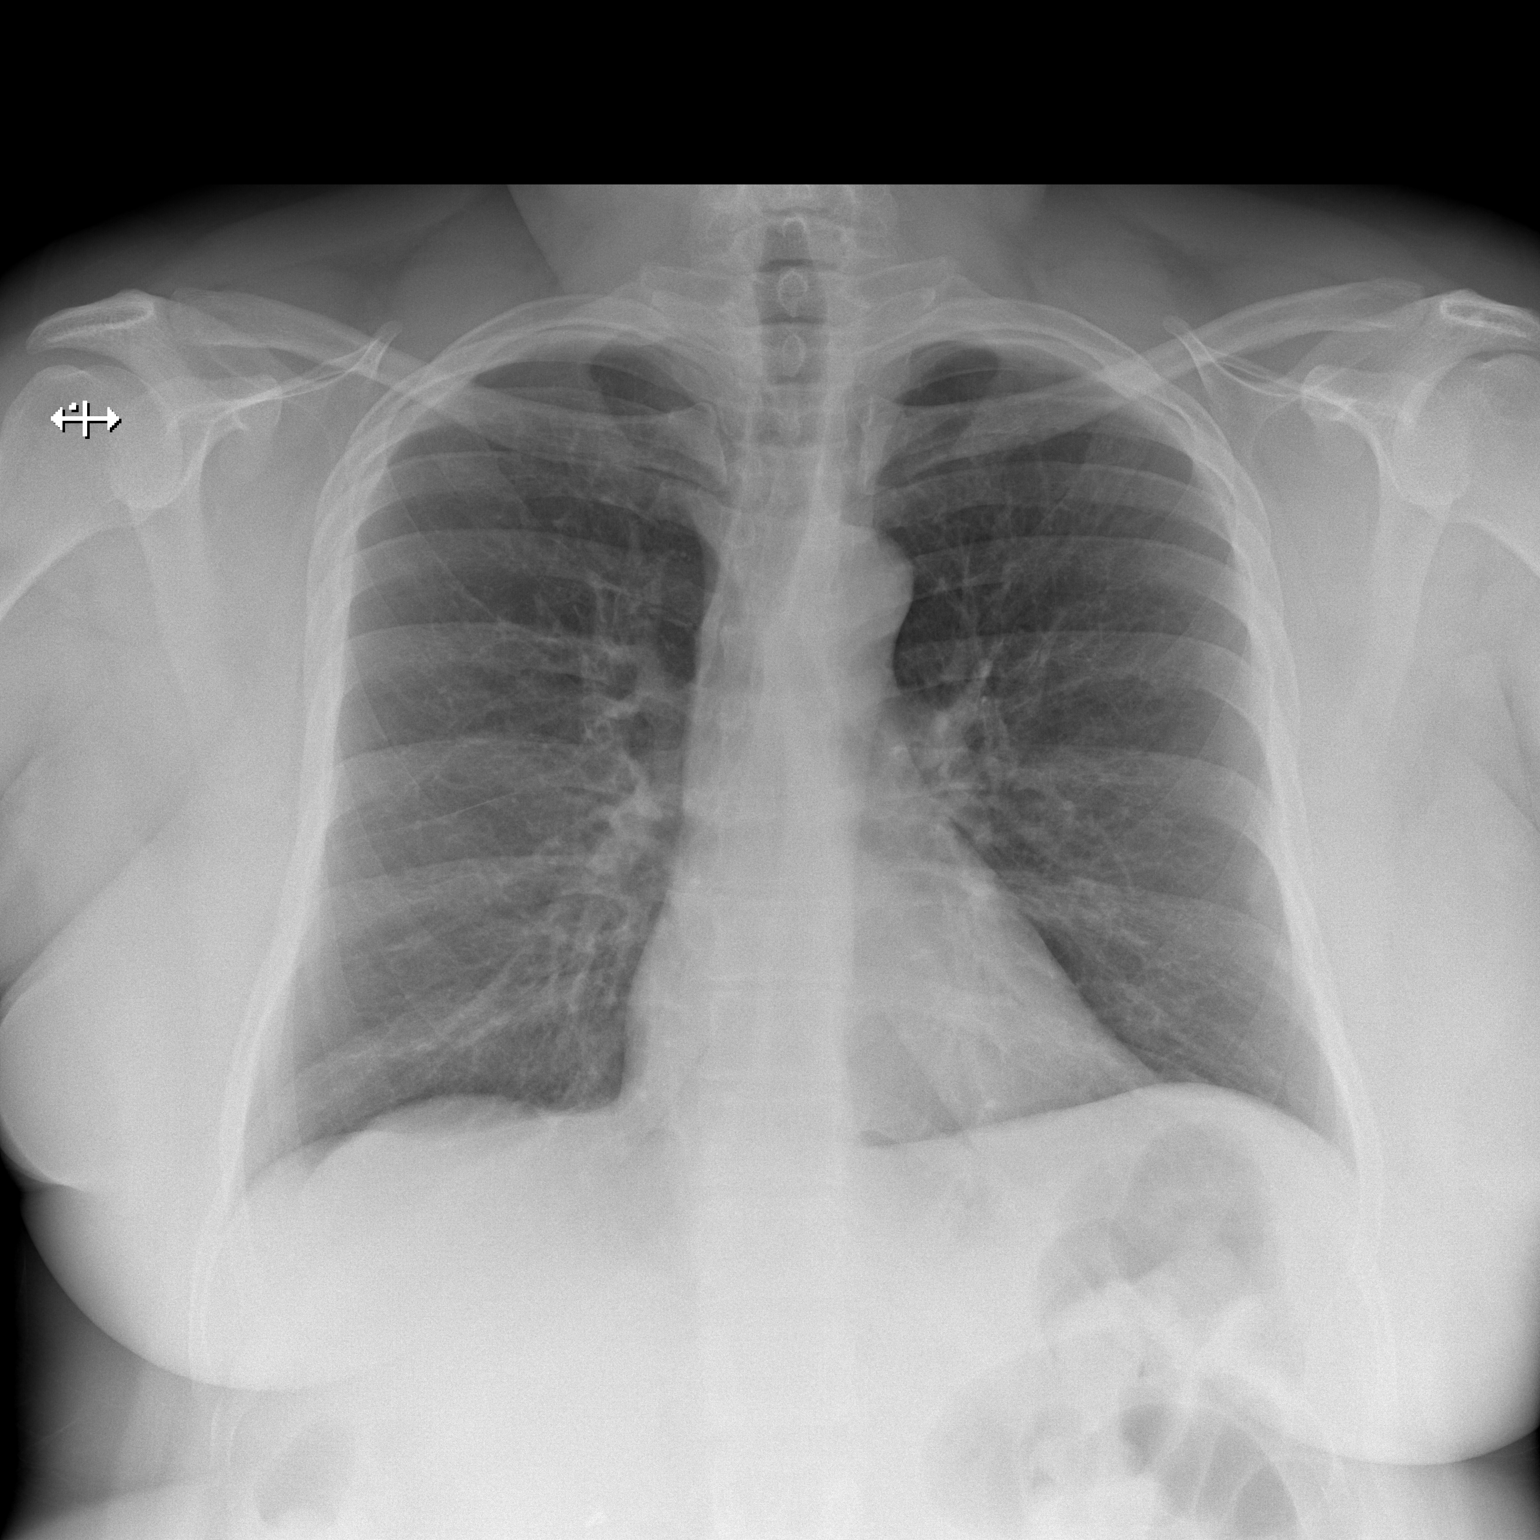
[im 2/2]
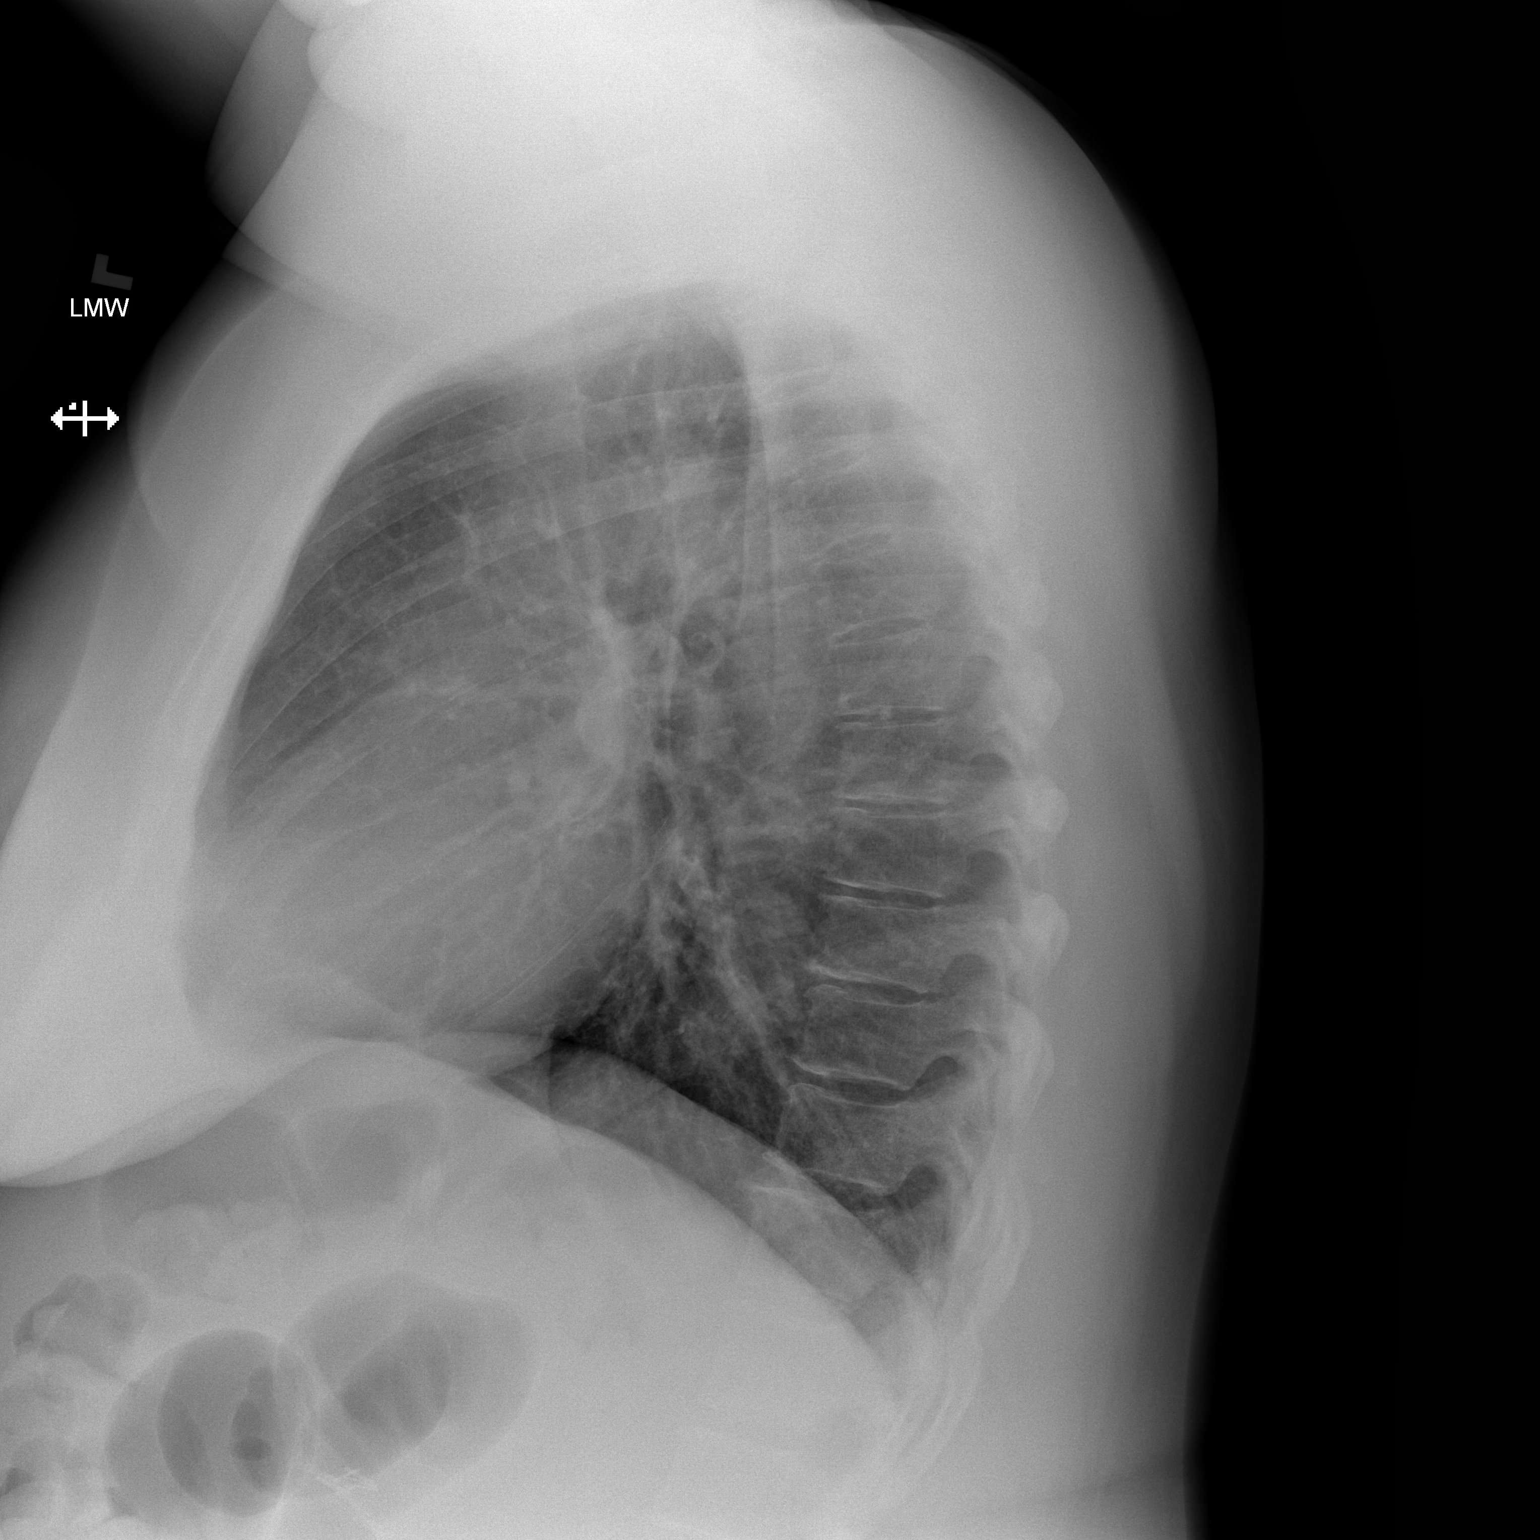

[2 of 2 positions shown; findings below may reference images not displayed]

IMPRESSION: No significant abnormalities are noted.

## 2013-07-09 DIAGNOSIS — E119 Type 2 diabetes mellitus without complications: Secondary | ICD-10-CM | POA: Diagnosis not present

## 2013-07-09 DIAGNOSIS — F17200 Nicotine dependence, unspecified, uncomplicated: Secondary | ICD-10-CM | POA: Diagnosis not present

## 2013-07-09 DIAGNOSIS — J029 Acute pharyngitis, unspecified: Secondary | ICD-10-CM | POA: Diagnosis not present

## 2013-07-09 DIAGNOSIS — J45901 Unspecified asthma with (acute) exacerbation: Secondary | ICD-10-CM | POA: Diagnosis not present

## 2013-07-09 DIAGNOSIS — E669 Obesity, unspecified: Secondary | ICD-10-CM | POA: Diagnosis not present

## 2013-07-09 DIAGNOSIS — J441 Chronic obstructive pulmonary disease with (acute) exacerbation: Secondary | ICD-10-CM | POA: Diagnosis not present

## 2013-07-09 DIAGNOSIS — I10 Essential (primary) hypertension: Secondary | ICD-10-CM | POA: Diagnosis not present

## 2013-07-09 DIAGNOSIS — J309 Allergic rhinitis, unspecified: Secondary | ICD-10-CM | POA: Diagnosis not present

## 2013-07-09 DIAGNOSIS — R498 Other voice and resonance disorders: Secondary | ICD-10-CM | POA: Diagnosis not present

## 2013-08-28 ENCOUNTER — Emergency Department: Payer: Self-pay | Admitting: Emergency Medicine

## 2013-08-28 DIAGNOSIS — R0602 Shortness of breath: Secondary | ICD-10-CM | POA: Diagnosis not present

## 2013-08-28 DIAGNOSIS — J449 Chronic obstructive pulmonary disease, unspecified: Secondary | ICD-10-CM | POA: Diagnosis not present

## 2013-08-28 DIAGNOSIS — Z79899 Other long term (current) drug therapy: Secondary | ICD-10-CM | POA: Diagnosis not present

## 2013-08-28 DIAGNOSIS — J45901 Unspecified asthma with (acute) exacerbation: Secondary | ICD-10-CM | POA: Diagnosis not present

## 2013-08-28 DIAGNOSIS — I10 Essential (primary) hypertension: Secondary | ICD-10-CM | POA: Diagnosis not present

## 2013-08-28 DIAGNOSIS — J45909 Unspecified asthma, uncomplicated: Secondary | ICD-10-CM | POA: Diagnosis not present

## 2013-08-28 DIAGNOSIS — R079 Chest pain, unspecified: Secondary | ICD-10-CM | POA: Diagnosis not present

## 2013-08-28 DIAGNOSIS — E119 Type 2 diabetes mellitus without complications: Secondary | ICD-10-CM | POA: Diagnosis not present

## 2013-08-28 LAB — COMPREHENSIVE METABOLIC PANEL
Alkaline Phosphatase: 76 U/L (ref 50–136)
Anion Gap: 5 — ABNORMAL LOW (ref 7–16)
BUN: 17 mg/dL (ref 7–18)
Bilirubin,Total: 0.5 mg/dL (ref 0.2–1.0)
Calcium, Total: 9 mg/dL (ref 8.5–10.1)
Co2: 25 mmol/L (ref 21–32)
Creatinine: 1.39 mg/dL — ABNORMAL HIGH (ref 0.60–1.30)
EGFR (African American): 52 — ABNORMAL LOW
SGPT (ALT): 49 U/L (ref 12–78)
Sodium: 140 mmol/L (ref 136–145)
Total Protein: 6.6 g/dL (ref 6.4–8.2)

## 2013-08-28 LAB — CBC
HCT: 42.5 % (ref 35.0–47.0)
MCH: 29.1 pg (ref 26.0–34.0)
MCV: 87 fL (ref 80–100)
Platelet: 336 10*3/uL (ref 150–440)
RBC: 4.88 10*6/uL (ref 3.80–5.20)
WBC: 9.3 10*3/uL (ref 3.6–11.0)

## 2013-08-28 LAB — CK TOTAL AND CKMB (NOT AT ARMC): CK-MB: 0.8 ng/mL (ref 0.5–3.6)

## 2013-09-06 IMAGING — CR DG CHEST 2V
1 series · 2 of 2 positions shown · non-contrast
Comparison: none

REASON FOR EXAM: Shortness of Breath
COMMENTS:   May transport without cardiac monitor

[Series 3: w chest pa · 0.14mm/px · 2 of 2 slices shown]
[im 1/2]
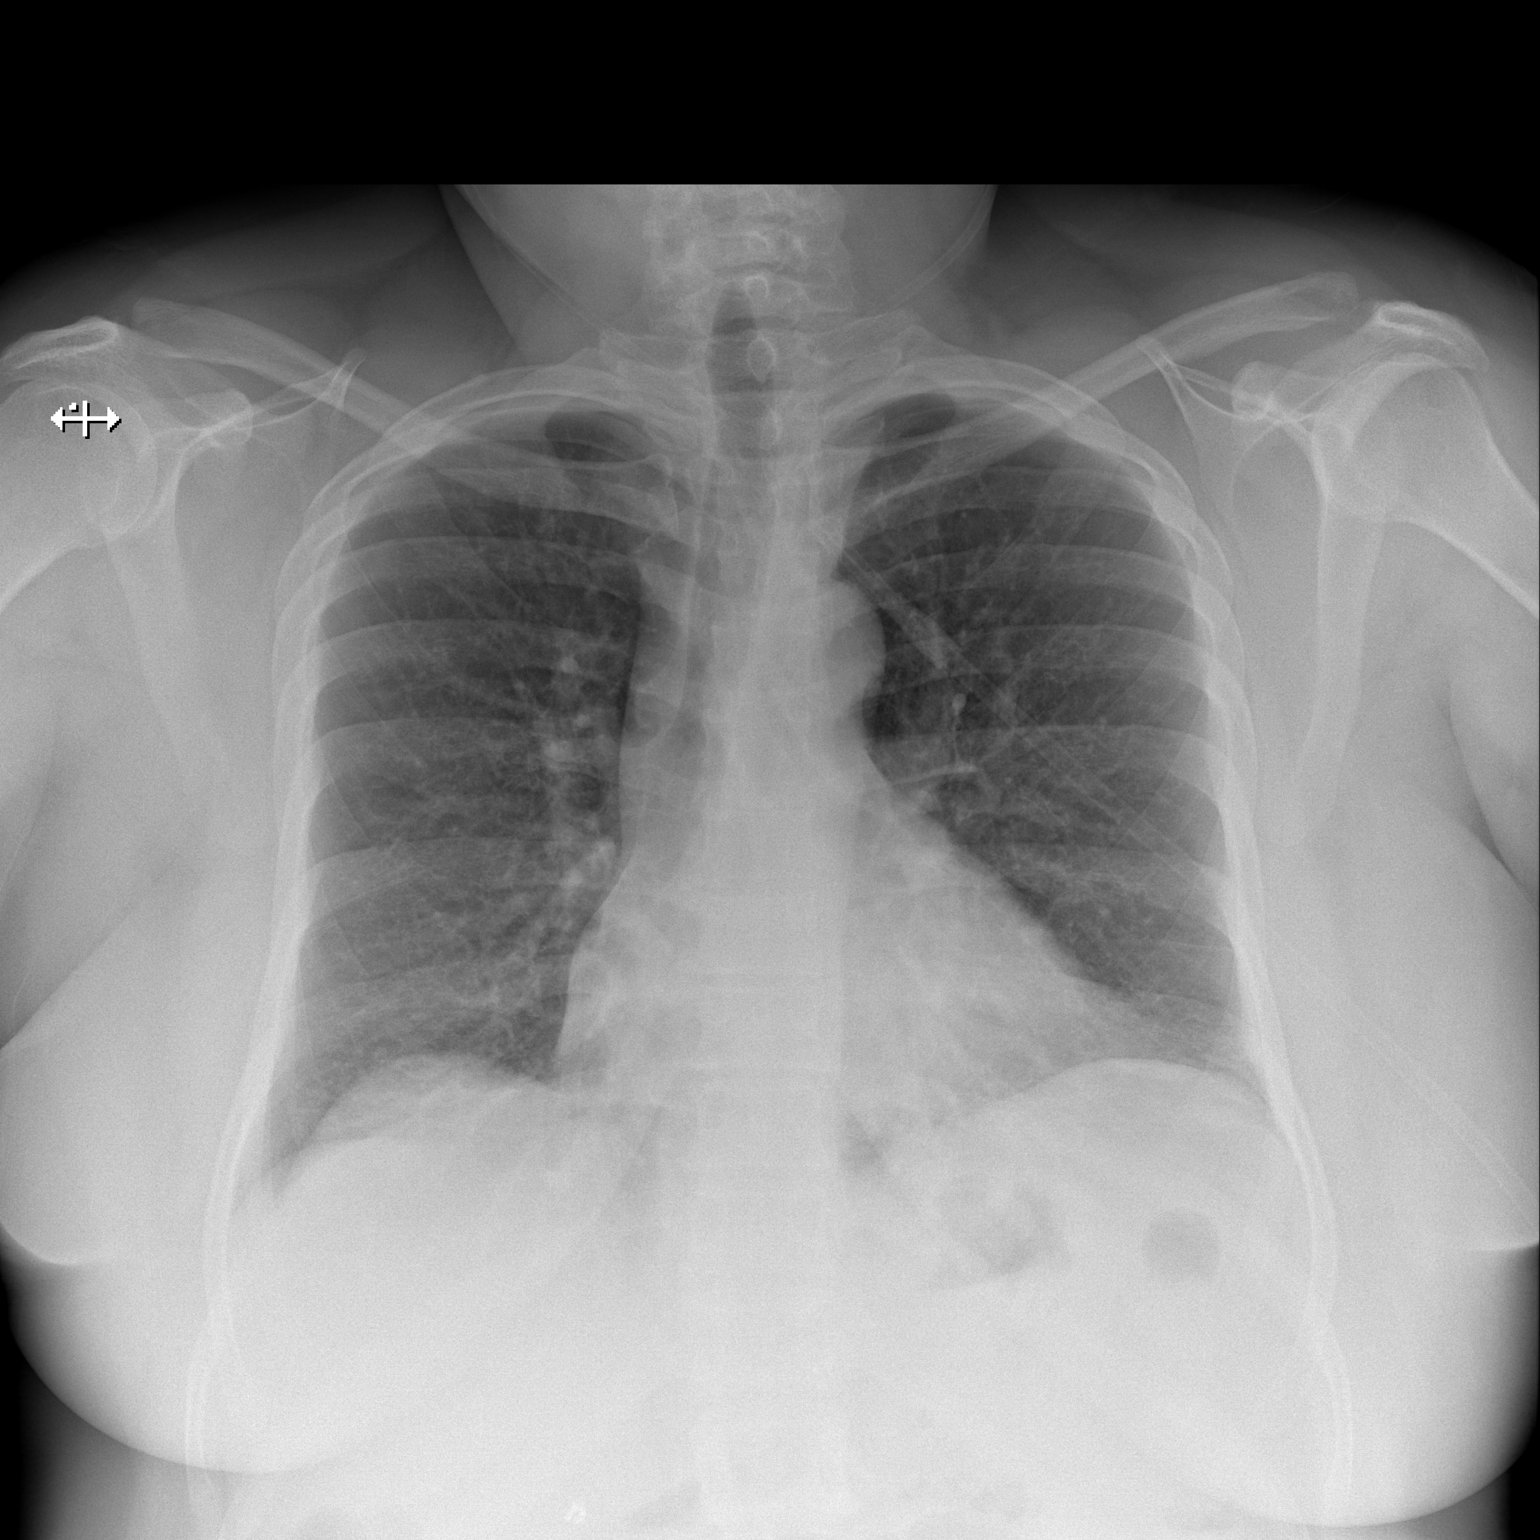
[im 2/2]
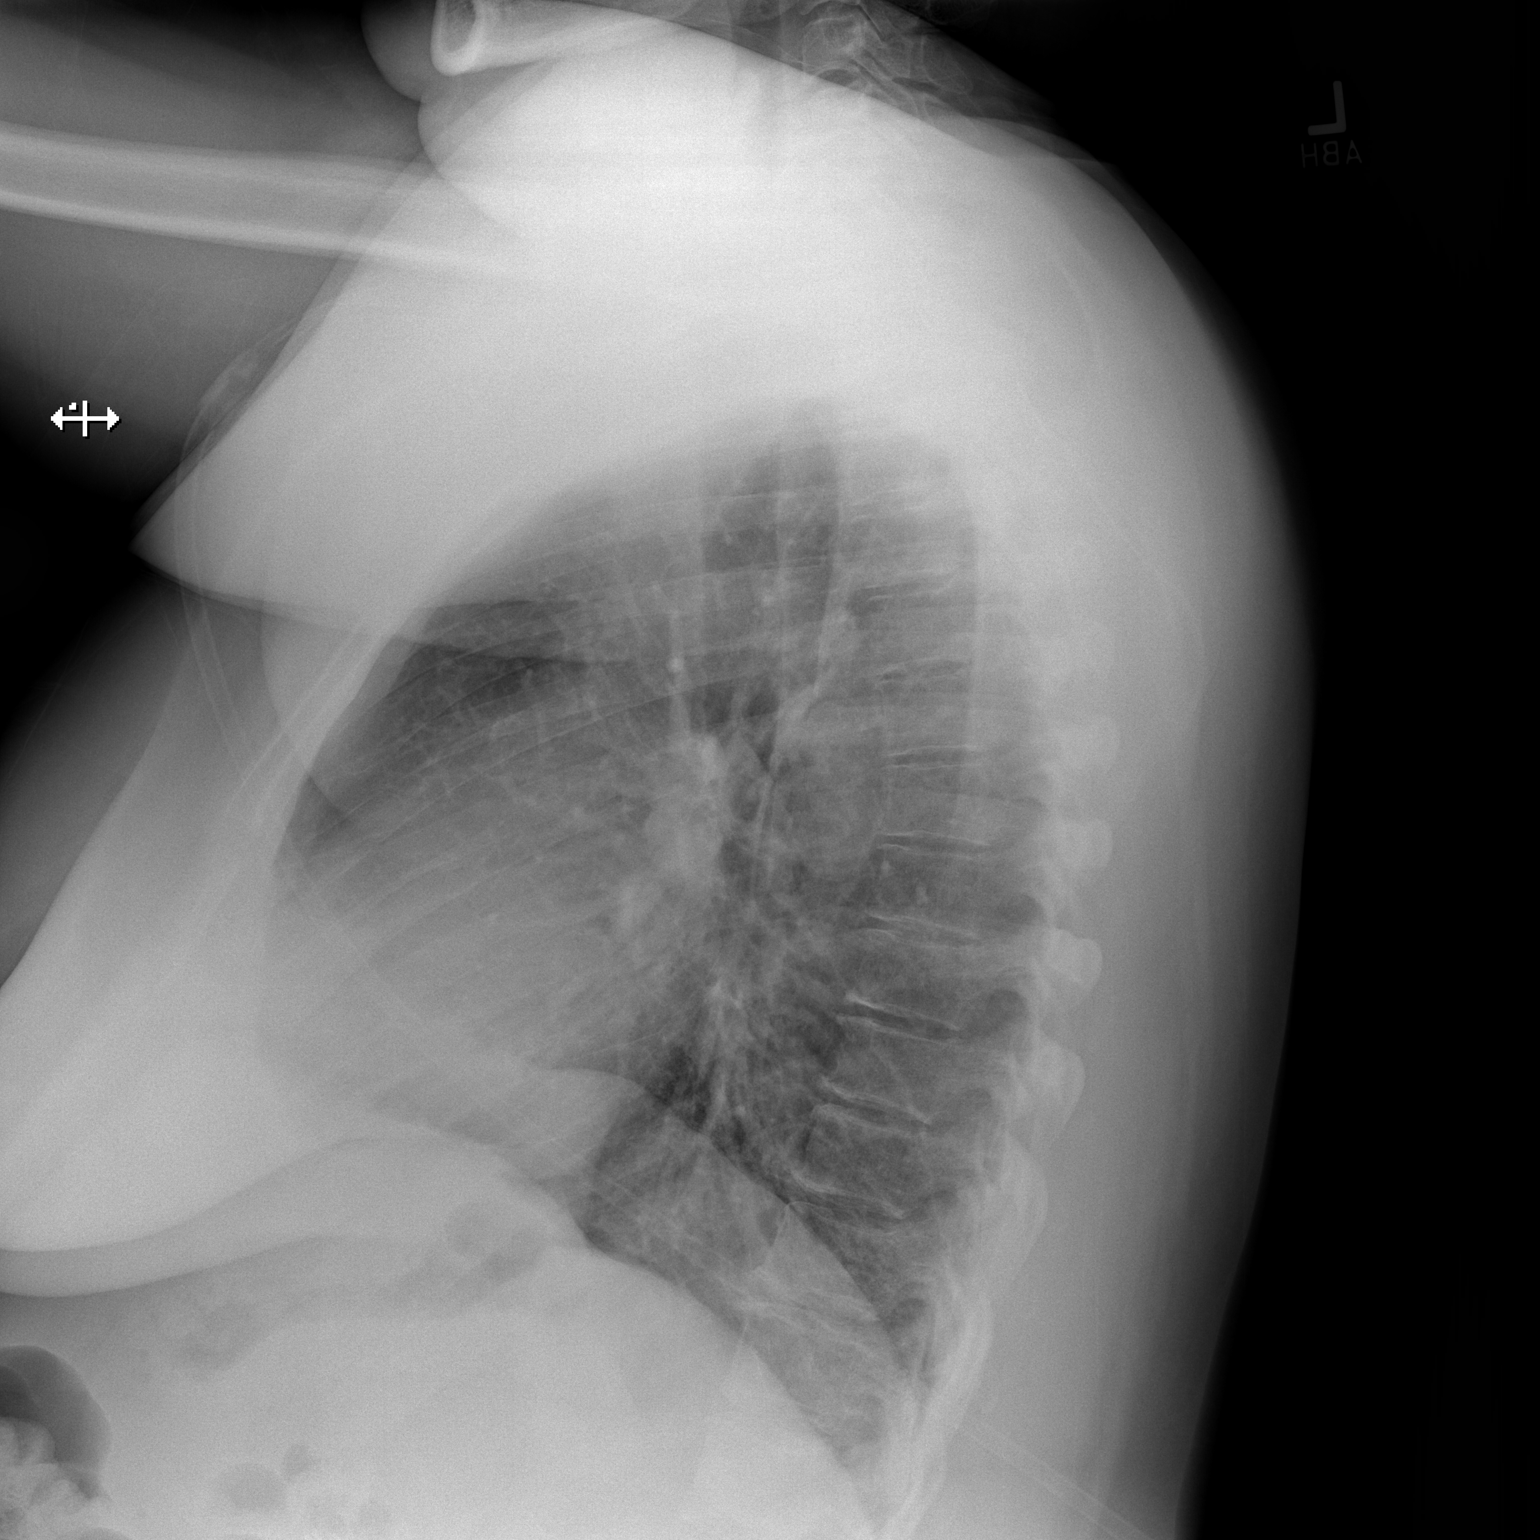

[2 of 2 positions shown; findings below may reference images not displayed]

PROCEDURE:     DXR - DXR CHEST PA (OR AP) AND LATERAL  - May 07, 2012 [DATE]

RESULT:     Comparison is made to the prior exam of 04/19/2012.

The lung fields are clear. No pneumonia, pneumothorax or pleural effusion is
seen. The heart size is normal. The mediastinal and osseous structures
reveal no significant abnormalities.
IMPRESSION: No acute changes are identified.

[REDACTED]

## 2013-09-07 IMAGING — CR DG CHEST 2V
1 series · 2 of 2 positions shown · non-contrast
Comparison: none

REASON FOR EXAM: Follow up -COPD
COMMENTS:

[Series 1: pa · 0.17mm/px · 2 of 2 slices shown]
[im 1/2]
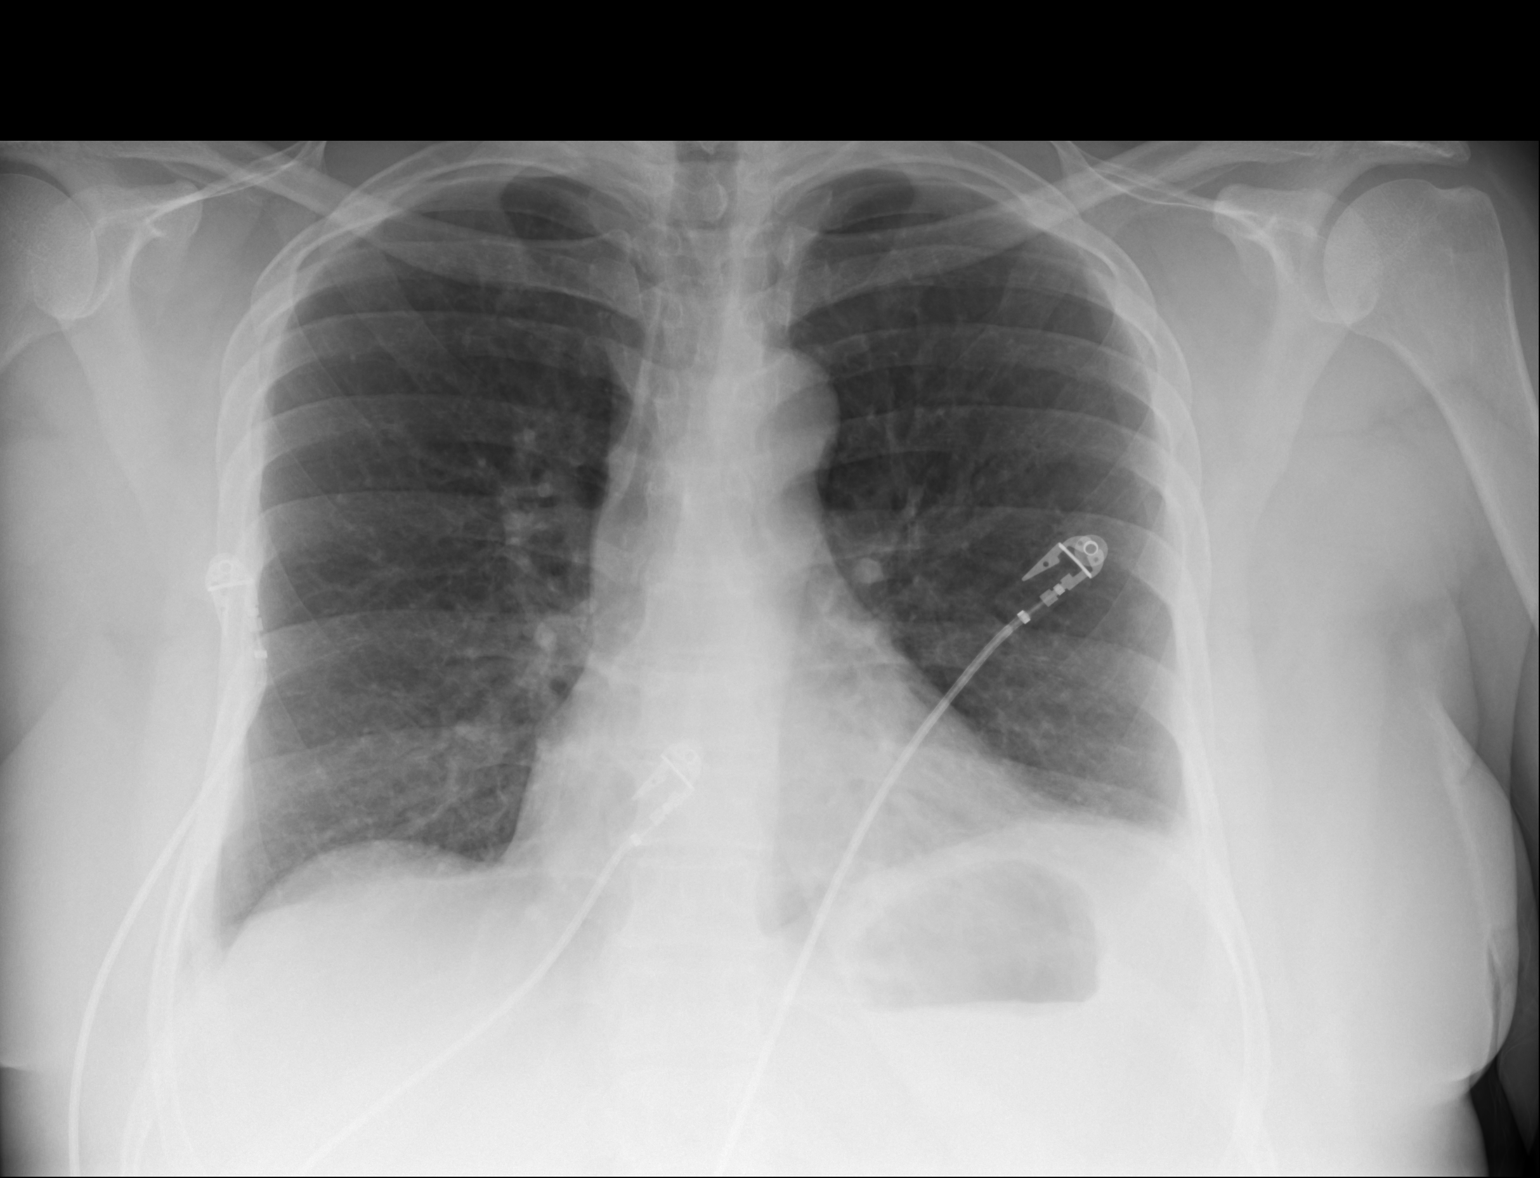
[im 2/2]
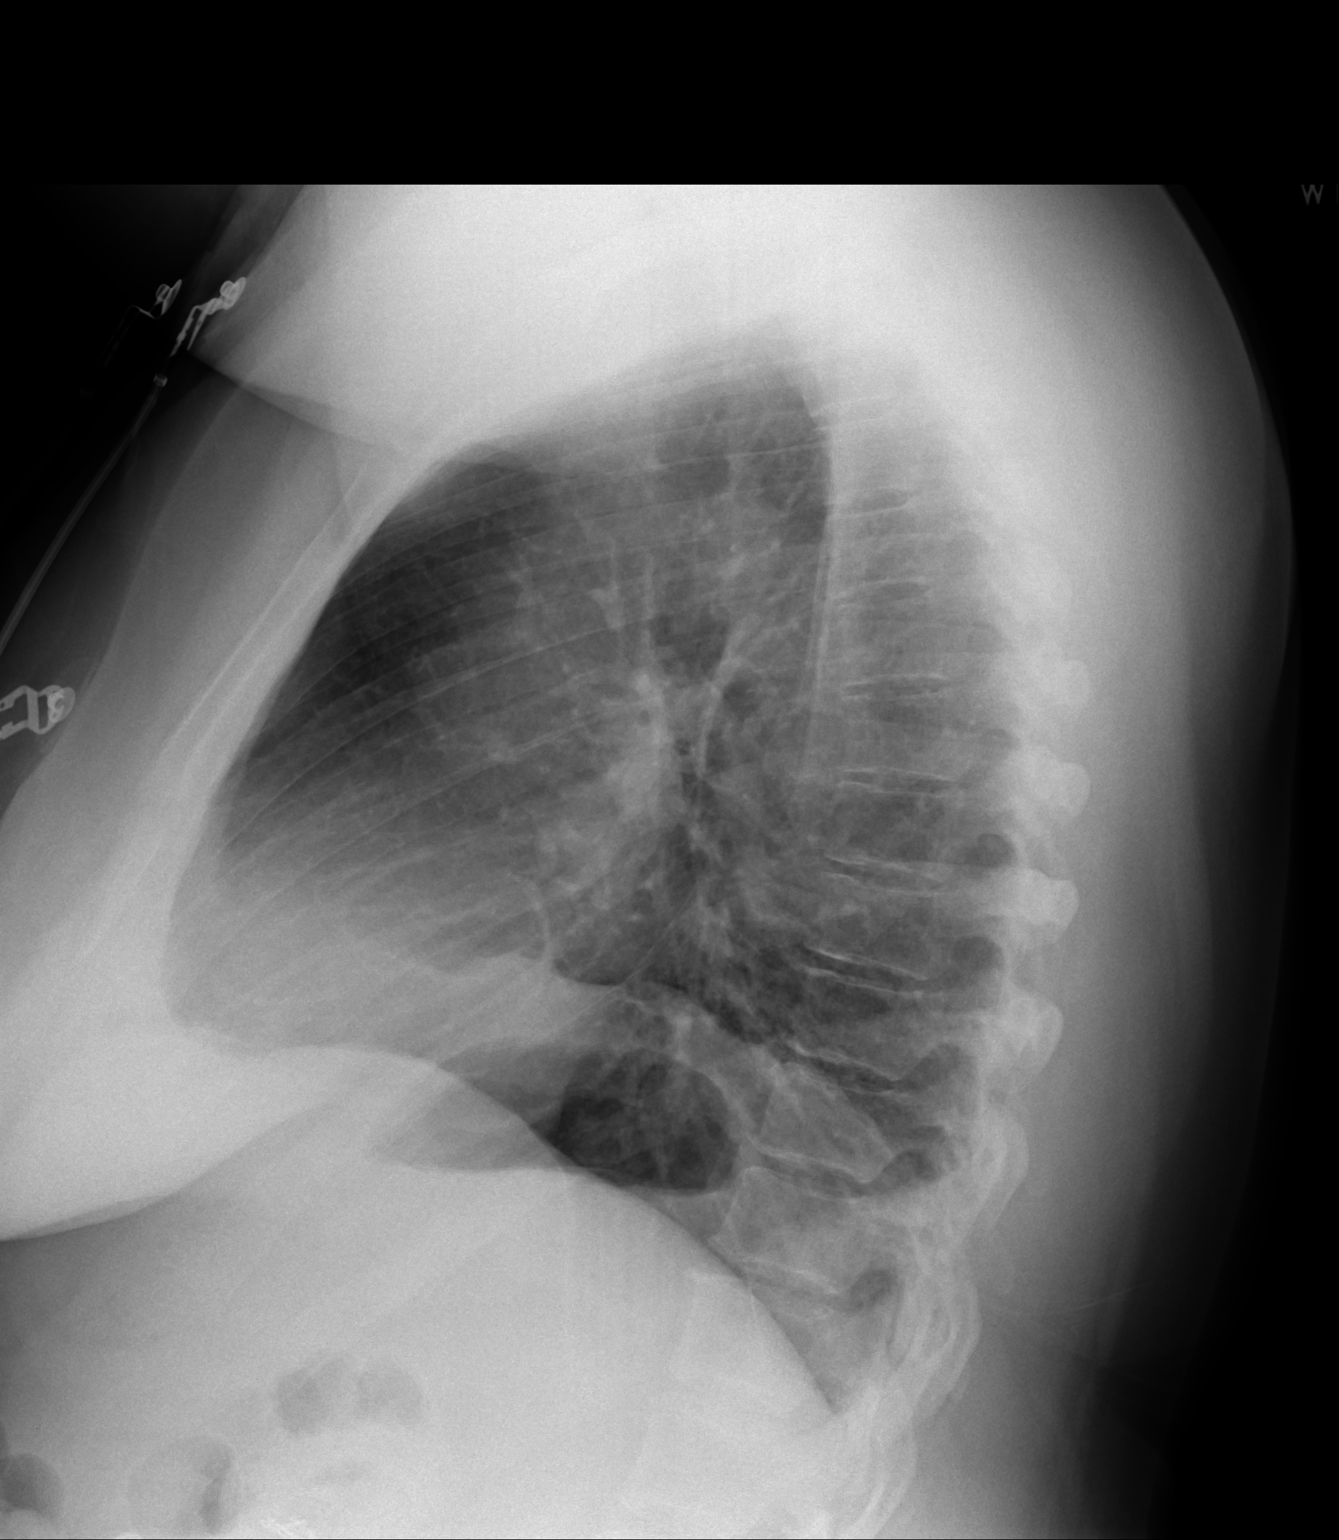

[2 of 2 positions shown; findings below may reference images not displayed]

PROCEDURE:     DXR - DXR CHEST PA (OR AP) AND LATERAL  - May 08, 2012  [DATE]

RESULT:     Comparison is made to the prior exam of 05/07/2012.

The lung fields are clear. No pneumonia, pneumothorax or pleural effusion is
seen. The heart size is within normal limits. I do not see definitive
changes of COPD but if such is a clinical consideration, than additional
evaluation by pulmonary function tests may be helpful.
IMPRESSION: 1.  The lung fields are clear.
2.  The heart size is normal.
3.  Stable appearing chest as compared to the prior exam of 05/07/2012.

[REDACTED]

## 2013-09-25 ENCOUNTER — Inpatient Hospital Stay: Payer: Self-pay | Admitting: Internal Medicine

## 2013-09-25 ENCOUNTER — Emergency Department: Payer: Self-pay | Admitting: Emergency Medicine

## 2013-09-25 DIAGNOSIS — M62838 Other muscle spasm: Secondary | ICD-10-CM | POA: Diagnosis present

## 2013-09-25 DIAGNOSIS — E1142 Type 2 diabetes mellitus with diabetic polyneuropathy: Secondary | ICD-10-CM | POA: Diagnosis present

## 2013-09-25 DIAGNOSIS — J9620 Acute and chronic respiratory failure, unspecified whether with hypoxia or hypercapnia: Secondary | ICD-10-CM | POA: Diagnosis present

## 2013-09-25 DIAGNOSIS — K5900 Constipation, unspecified: Secondary | ICD-10-CM | POA: Diagnosis present

## 2013-09-25 DIAGNOSIS — M542 Cervicalgia: Secondary | ICD-10-CM | POA: Diagnosis present

## 2013-09-25 DIAGNOSIS — I10 Essential (primary) hypertension: Secondary | ICD-10-CM | POA: Diagnosis not present

## 2013-09-25 DIAGNOSIS — F17200 Nicotine dependence, unspecified, uncomplicated: Secondary | ICD-10-CM | POA: Diagnosis not present

## 2013-09-25 DIAGNOSIS — E1149 Type 2 diabetes mellitus with other diabetic neurological complication: Secondary | ICD-10-CM | POA: Diagnosis not present

## 2013-09-25 DIAGNOSIS — R0609 Other forms of dyspnea: Secondary | ICD-10-CM | POA: Diagnosis not present

## 2013-09-25 DIAGNOSIS — Z794 Long term (current) use of insulin: Secondary | ICD-10-CM | POA: Diagnosis not present

## 2013-09-25 DIAGNOSIS — N179 Acute kidney failure, unspecified: Secondary | ICD-10-CM | POA: Diagnosis present

## 2013-09-25 DIAGNOSIS — R51 Headache: Secondary | ICD-10-CM | POA: Diagnosis present

## 2013-09-25 DIAGNOSIS — J441 Chronic obstructive pulmonary disease with (acute) exacerbation: Secondary | ICD-10-CM | POA: Diagnosis not present

## 2013-09-25 DIAGNOSIS — J45901 Unspecified asthma with (acute) exacerbation: Secondary | ICD-10-CM | POA: Diagnosis not present

## 2013-09-25 DIAGNOSIS — J209 Acute bronchitis, unspecified: Secondary | ICD-10-CM | POA: Diagnosis not present

## 2013-09-25 DIAGNOSIS — R0602 Shortness of breath: Secondary | ICD-10-CM | POA: Diagnosis not present

## 2013-09-25 DIAGNOSIS — F411 Generalized anxiety disorder: Secondary | ICD-10-CM | POA: Diagnosis not present

## 2013-09-25 DIAGNOSIS — Z6841 Body Mass Index (BMI) 40.0 and over, adult: Secondary | ICD-10-CM | POA: Diagnosis not present

## 2013-09-25 DIAGNOSIS — J440 Chronic obstructive pulmonary disease with acute lower respiratory infection: Secondary | ICD-10-CM | POA: Diagnosis present

## 2013-09-25 DIAGNOSIS — E119 Type 2 diabetes mellitus without complications: Secondary | ICD-10-CM | POA: Diagnosis not present

## 2013-09-25 LAB — CBC
HCT: 45.8 % (ref 35.0–47.0)
MCH: 29.4 pg (ref 26.0–34.0)
MCV: 88 fL (ref 80–100)
RBC: 5.18 10*6/uL (ref 3.80–5.20)
WBC: 8.8 10*3/uL (ref 3.6–11.0)

## 2013-09-25 LAB — BASIC METABOLIC PANEL
Anion Gap: 7 (ref 7–16)
BUN: 16 mg/dL (ref 7–18)
Chloride: 106 mmol/L (ref 98–107)
EGFR (African American): 38 — ABNORMAL LOW
EGFR (Non-African Amer.): 33 — ABNORMAL LOW
Osmolality: 284 (ref 275–301)
Potassium: 4.5 mmol/L (ref 3.5–5.1)
Sodium: 136 mmol/L (ref 136–145)

## 2013-09-25 LAB — TROPONIN I: Troponin-I: <0.02 ng/mL

## 2013-09-26 LAB — CBC WITH DIFFERENTIAL/PLATELET
HGB: 14.1 g/dL (ref 12.0–16.0)
Lymphocyte #: 1.1 10*3/uL (ref 1.0–3.6)
MCH: 29.9 pg (ref 26.0–34.0)
Monocyte #: 0.1 x10 3/mm — ABNORMAL LOW (ref 0.2–0.9)
Neutrophil #: 9.5 10*3/uL — ABNORMAL HIGH (ref 1.4–6.5)
Neutrophil %: 89.3 %
RBC: 4.73 10*6/uL (ref 3.80–5.20)
RDW: 14.8 % — ABNORMAL HIGH (ref 11.5–14.5)
WBC: 10.6 10*3/uL (ref 3.6–11.0)

## 2013-09-26 LAB — BASIC METABOLIC PANEL
Anion Gap: 9 (ref 7–16)
BUN: 19 mg/dL — ABNORMAL HIGH (ref 7–18)
EGFR (African American): 45 — ABNORMAL LOW
EGFR (Non-African Amer.): 39 — ABNORMAL LOW
Glucose: 248 mg/dL — ABNORMAL HIGH (ref 65–99)
Osmolality: 286 (ref 275–301)
Sodium: 138 mmol/L (ref 136–145)

## 2013-09-26 LAB — MAGNESIUM: Magnesium: 2.3 mg/dL

## 2013-09-27 LAB — HEMOGLOBIN A1C: Hemoglobin A1C: 6.9 % — ABNORMAL HIGH (ref 4.2–6.3)

## 2013-09-28 LAB — BASIC METABOLIC PANEL
Anion Gap: 8 (ref 7–16)
Chloride: 105 mmol/L (ref 98–107)
Co2: 25 mmol/L (ref 21–32)
Creatinine: 1.47 mg/dL — ABNORMAL HIGH (ref 0.60–1.30)
EGFR (African American): 48 — ABNORMAL LOW
EGFR (Non-African Amer.): 42 — ABNORMAL LOW
Glucose: 206 mg/dL — ABNORMAL HIGH (ref 65–99)
Osmolality: 286 (ref 275–301)
Potassium: 4.3 mmol/L (ref 3.5–5.1)
Sodium: 138 mmol/L (ref 136–145)

## 2013-10-06 IMAGING — CR DG CHEST 1V PORT
1 series · 1 of 1 positions shown · non-contrast
Comparison: none

REASON FOR EXAM: cough dyspnea
COMMENTS:

[portable]
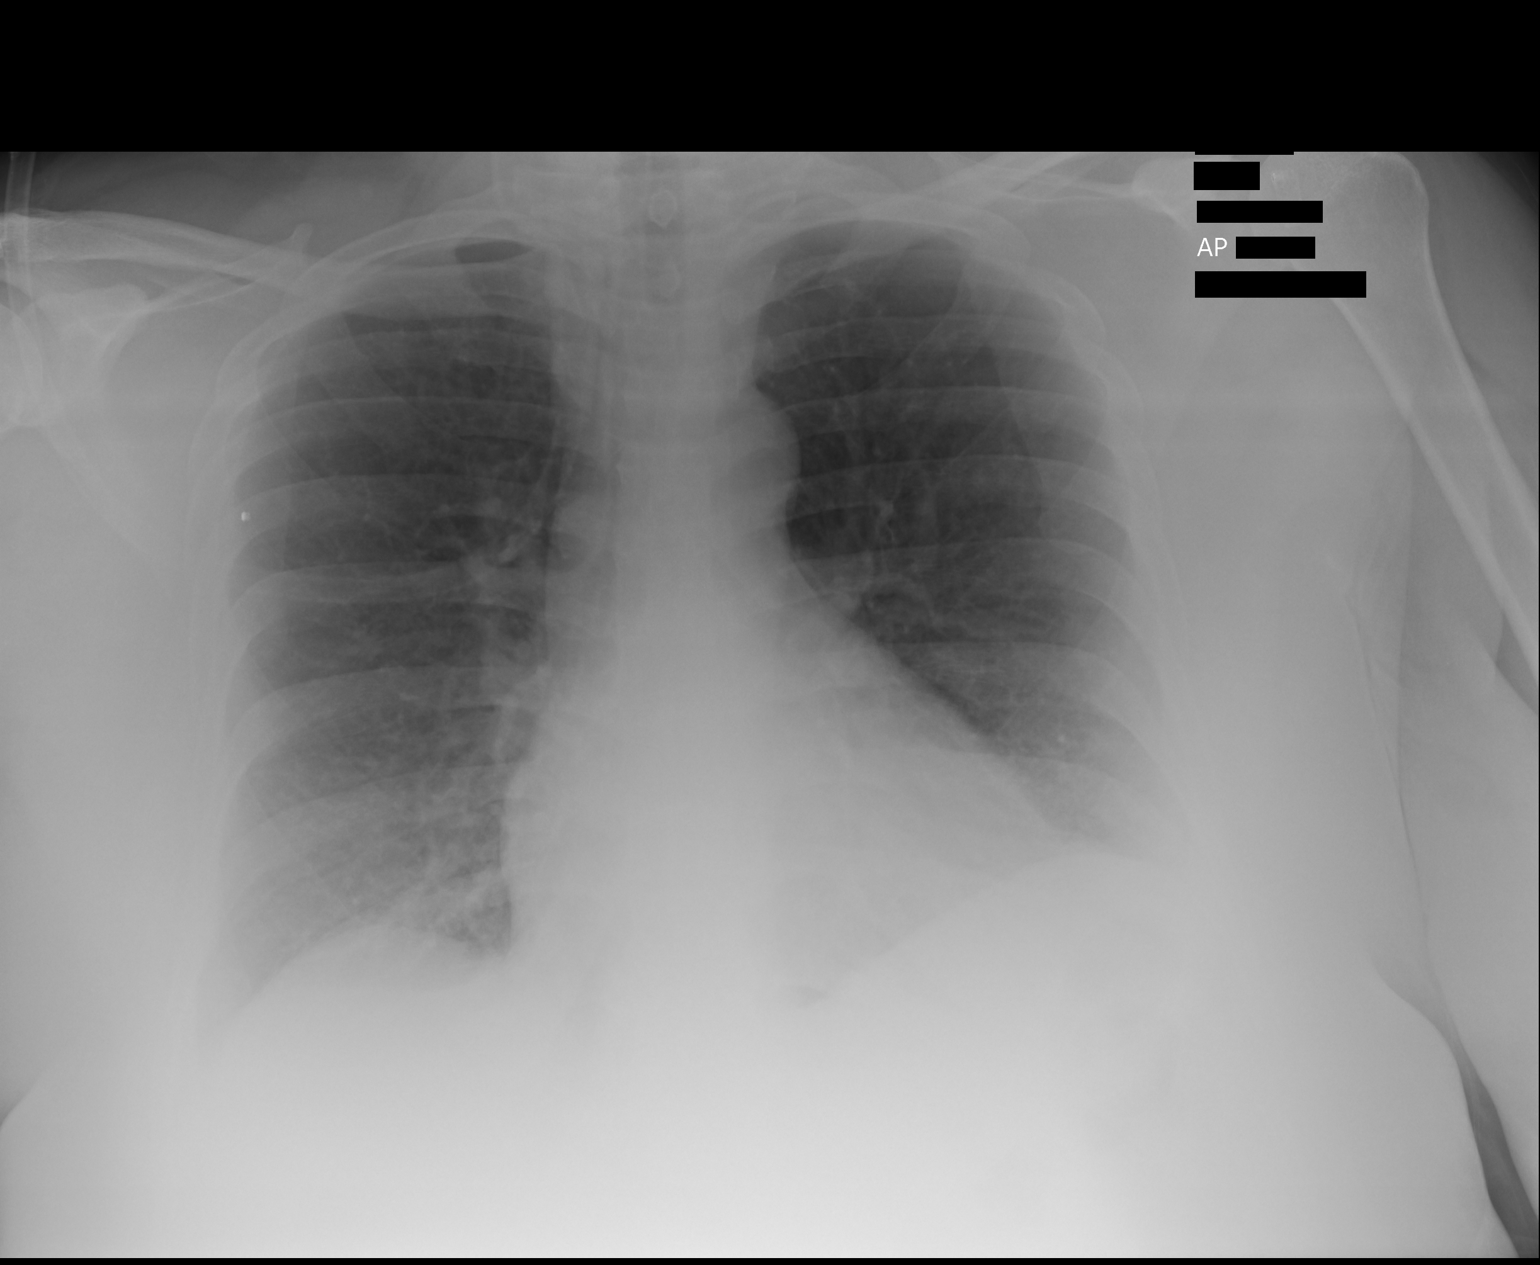

[1 of 1 positions shown; findings below may reference images not displayed]

PROCEDURE:     DXR - DXR PORTABLE CHEST SINGLE VIEW  - June 06, 2012  [DATE]

RESULT:     Comparison is made to the study dated 08 May, 2012.

The projection is lordotic. The lungs are clear. The heart and pulmonary
vessels are normal. The bony and mediastinal structures are unremarkable.
There is no effusion. There is no pneumothorax or evidence of congestive
failure.
IMPRESSION: No acute cardiopulmonary disease.

[REDACTED]

## 2013-11-01 DIAGNOSIS — F17200 Nicotine dependence, unspecified, uncomplicated: Secondary | ICD-10-CM | POA: Diagnosis not present

## 2013-11-01 DIAGNOSIS — J440 Chronic obstructive pulmonary disease with acute lower respiratory infection: Secondary | ICD-10-CM | POA: Diagnosis not present

## 2013-11-01 DIAGNOSIS — J301 Allergic rhinitis due to pollen: Secondary | ICD-10-CM | POA: Diagnosis not present

## 2013-12-23 DIAGNOSIS — J209 Acute bronchitis, unspecified: Secondary | ICD-10-CM | POA: Diagnosis not present

## 2013-12-23 DIAGNOSIS — J441 Chronic obstructive pulmonary disease with (acute) exacerbation: Secondary | ICD-10-CM | POA: Diagnosis not present

## 2014-01-16 ENCOUNTER — Emergency Department: Payer: Self-pay | Admitting: Emergency Medicine

## 2014-01-16 DIAGNOSIS — J441 Chronic obstructive pulmonary disease with (acute) exacerbation: Secondary | ICD-10-CM | POA: Diagnosis not present

## 2014-01-16 DIAGNOSIS — J9819 Other pulmonary collapse: Secondary | ICD-10-CM | POA: Diagnosis not present

## 2014-01-16 DIAGNOSIS — I498 Other specified cardiac arrhythmias: Secondary | ICD-10-CM | POA: Diagnosis not present

## 2014-01-16 DIAGNOSIS — F17200 Nicotine dependence, unspecified, uncomplicated: Secondary | ICD-10-CM | POA: Diagnosis not present

## 2014-01-16 DIAGNOSIS — Z79899 Other long term (current) drug therapy: Secondary | ICD-10-CM | POA: Diagnosis not present

## 2014-01-16 LAB — CBC
HCT: 43.9 % (ref 35.0–47.0)
HGB: 13.9 g/dL (ref 12.0–16.0)
MCH: 28.3 pg (ref 26.0–34.0)
MCHC: 31.7 g/dL — ABNORMAL LOW (ref 32.0–36.0)
MCV: 89 fL (ref 80–100)
Platelet: 348 10*3/uL (ref 150–440)
RBC: 4.92 10*6/uL (ref 3.80–5.20)
RDW: 14 % (ref 11.5–14.5)
WBC: 9.3 10*3/uL (ref 3.6–11.0)

## 2014-01-16 LAB — BASIC METABOLIC PANEL
Anion Gap: 3 — ABNORMAL LOW (ref 7–16)
BUN: 12 mg/dL (ref 7–18)
CALCIUM: 8.8 mg/dL (ref 8.5–10.1)
CHLORIDE: 105 mmol/L (ref 98–107)
CO2: 28 mmol/L (ref 21–32)
CREATININE: 1.26 mg/dL (ref 0.60–1.30)
EGFR (African American): 58 — ABNORMAL LOW
EGFR (Non-African Amer.): 50 — ABNORMAL LOW
Glucose: 158 mg/dL — ABNORMAL HIGH (ref 65–99)
Osmolality: 275 (ref 275–301)
Potassium: 4 mmol/L (ref 3.5–5.1)
Sodium: 136 mmol/L (ref 136–145)

## 2014-01-16 LAB — TROPONIN I

## 2014-02-27 ENCOUNTER — Emergency Department: Payer: Self-pay | Admitting: Emergency Medicine

## 2014-02-27 DIAGNOSIS — E119 Type 2 diabetes mellitus without complications: Secondary | ICD-10-CM | POA: Diagnosis not present

## 2014-02-27 DIAGNOSIS — J449 Chronic obstructive pulmonary disease, unspecified: Secondary | ICD-10-CM | POA: Diagnosis not present

## 2014-02-27 DIAGNOSIS — R0609 Other forms of dyspnea: Secondary | ICD-10-CM | POA: Diagnosis not present

## 2014-02-27 DIAGNOSIS — J441 Chronic obstructive pulmonary disease with (acute) exacerbation: Secondary | ICD-10-CM | POA: Diagnosis not present

## 2014-02-27 DIAGNOSIS — R062 Wheezing: Secondary | ICD-10-CM | POA: Diagnosis not present

## 2014-02-27 DIAGNOSIS — Z79899 Other long term (current) drug therapy: Secondary | ICD-10-CM | POA: Diagnosis not present

## 2014-02-27 DIAGNOSIS — I10 Essential (primary) hypertension: Secondary | ICD-10-CM | POA: Diagnosis not present

## 2014-02-27 DIAGNOSIS — R05 Cough: Secondary | ICD-10-CM | POA: Diagnosis not present

## 2014-02-27 DIAGNOSIS — R059 Cough, unspecified: Secondary | ICD-10-CM | POA: Diagnosis not present

## 2014-03-05 IMAGING — CR DG SHOULDER 3+V*R*
1 series · 3 of 3 positions shown · non-contrast
Comparison: none

REASON FOR EXAM: pain
COMMENTS:

PROCEDURE:     DXR - DXR SHOULDER RIGHT COMPLETE  - November 03, 2012  [DATE]
RESULT:     Right shoulder images demonstrate no fracture, dislocation or
radiopaque foreign body evident.

[Series 1: internal rotate · 0.17mm/px · 3 of 3 slices shown]
[im 1/3]
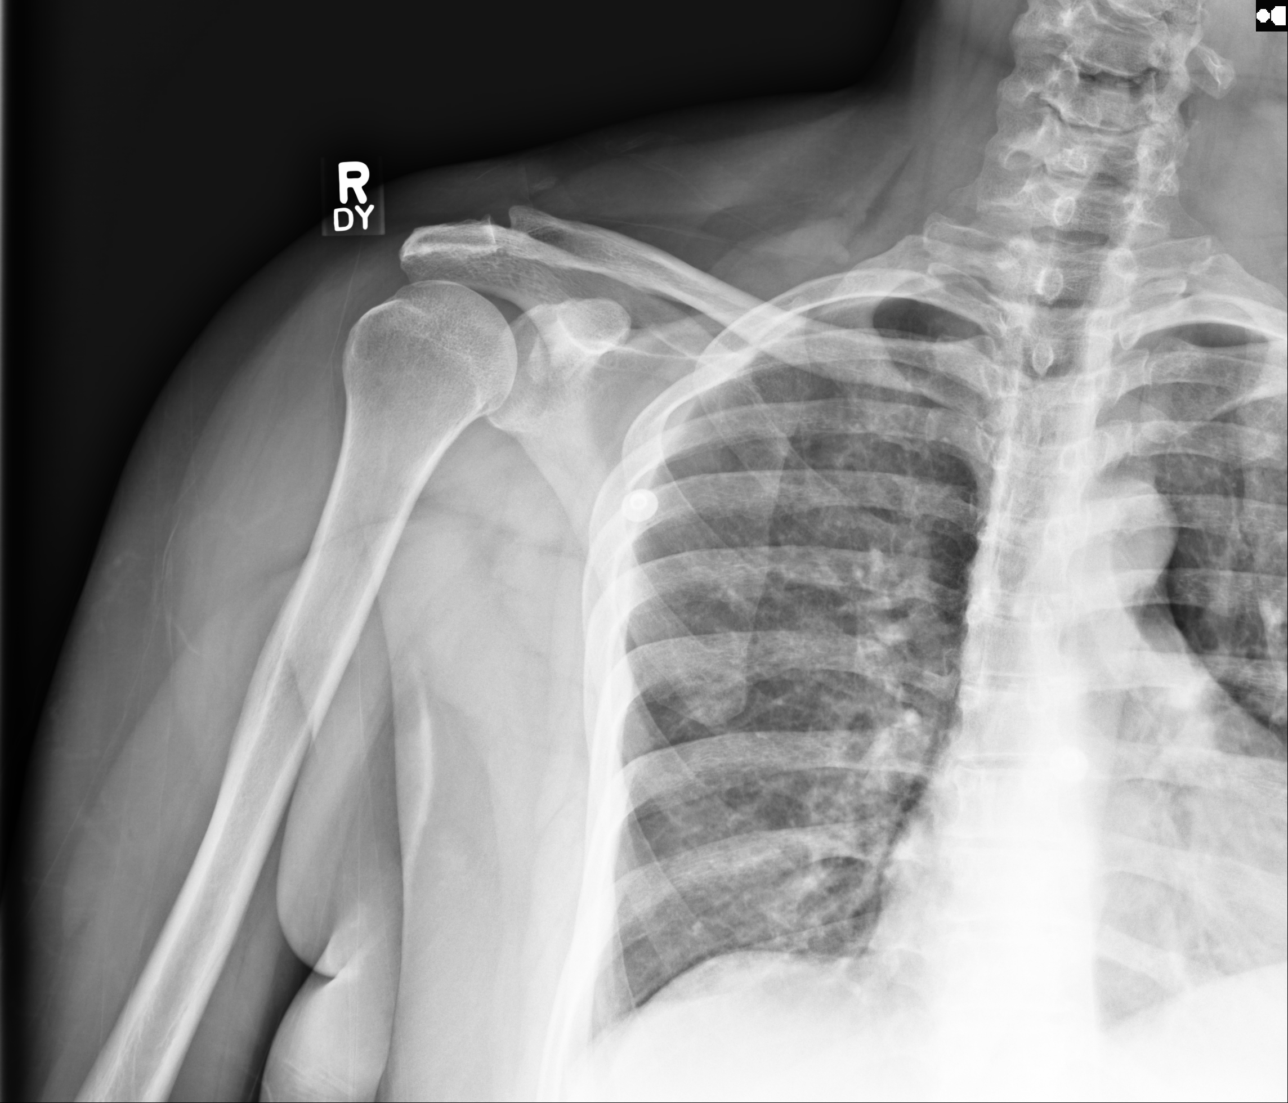
[im 2/3]
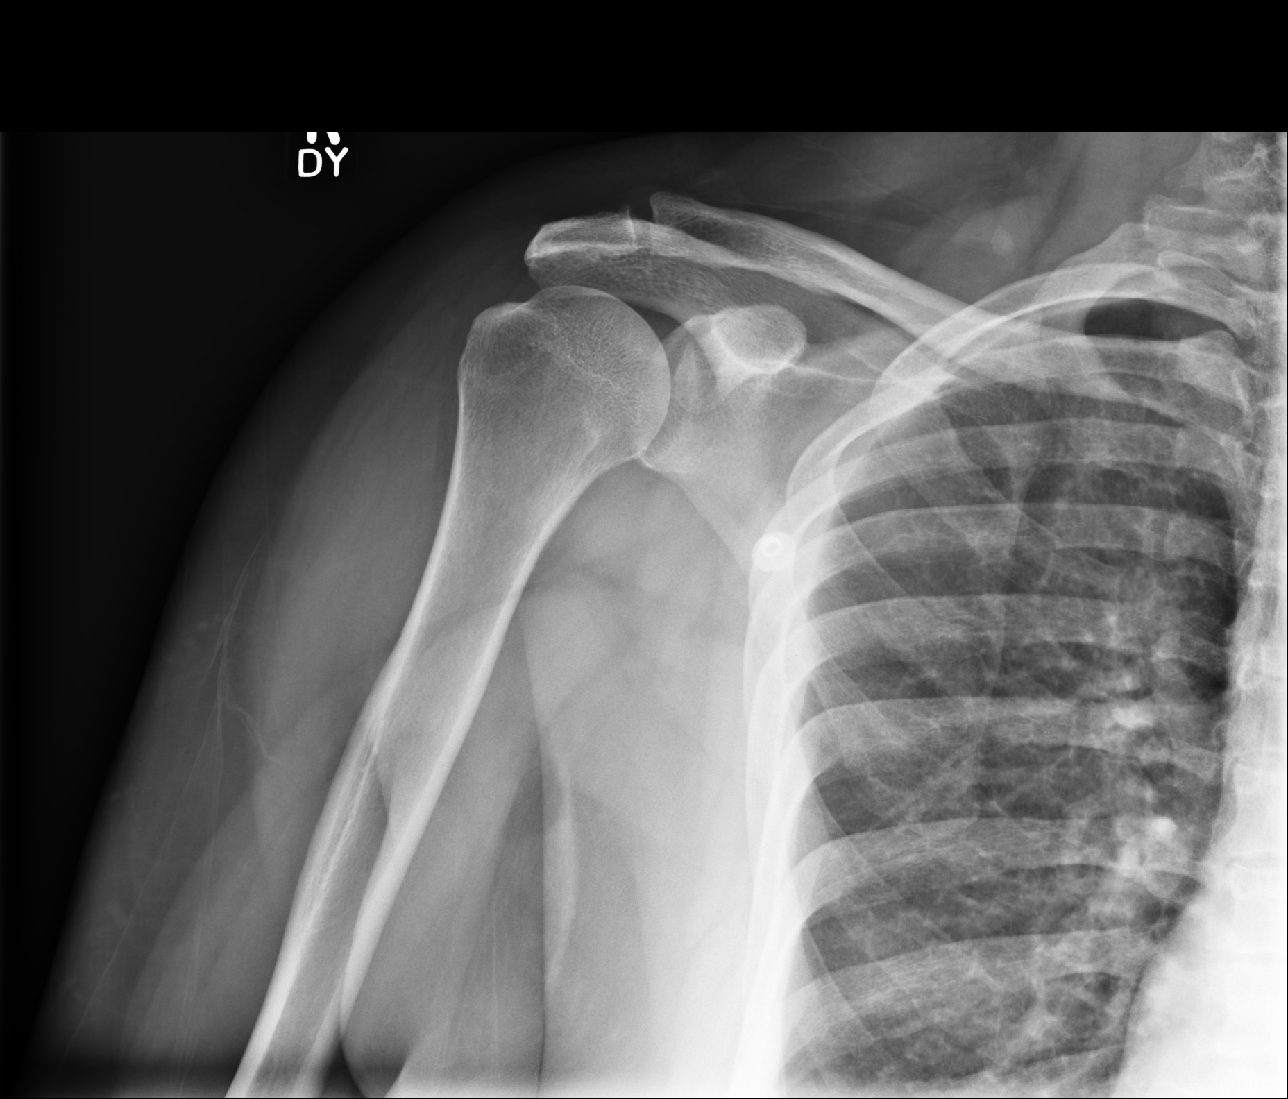
[im 3/3]
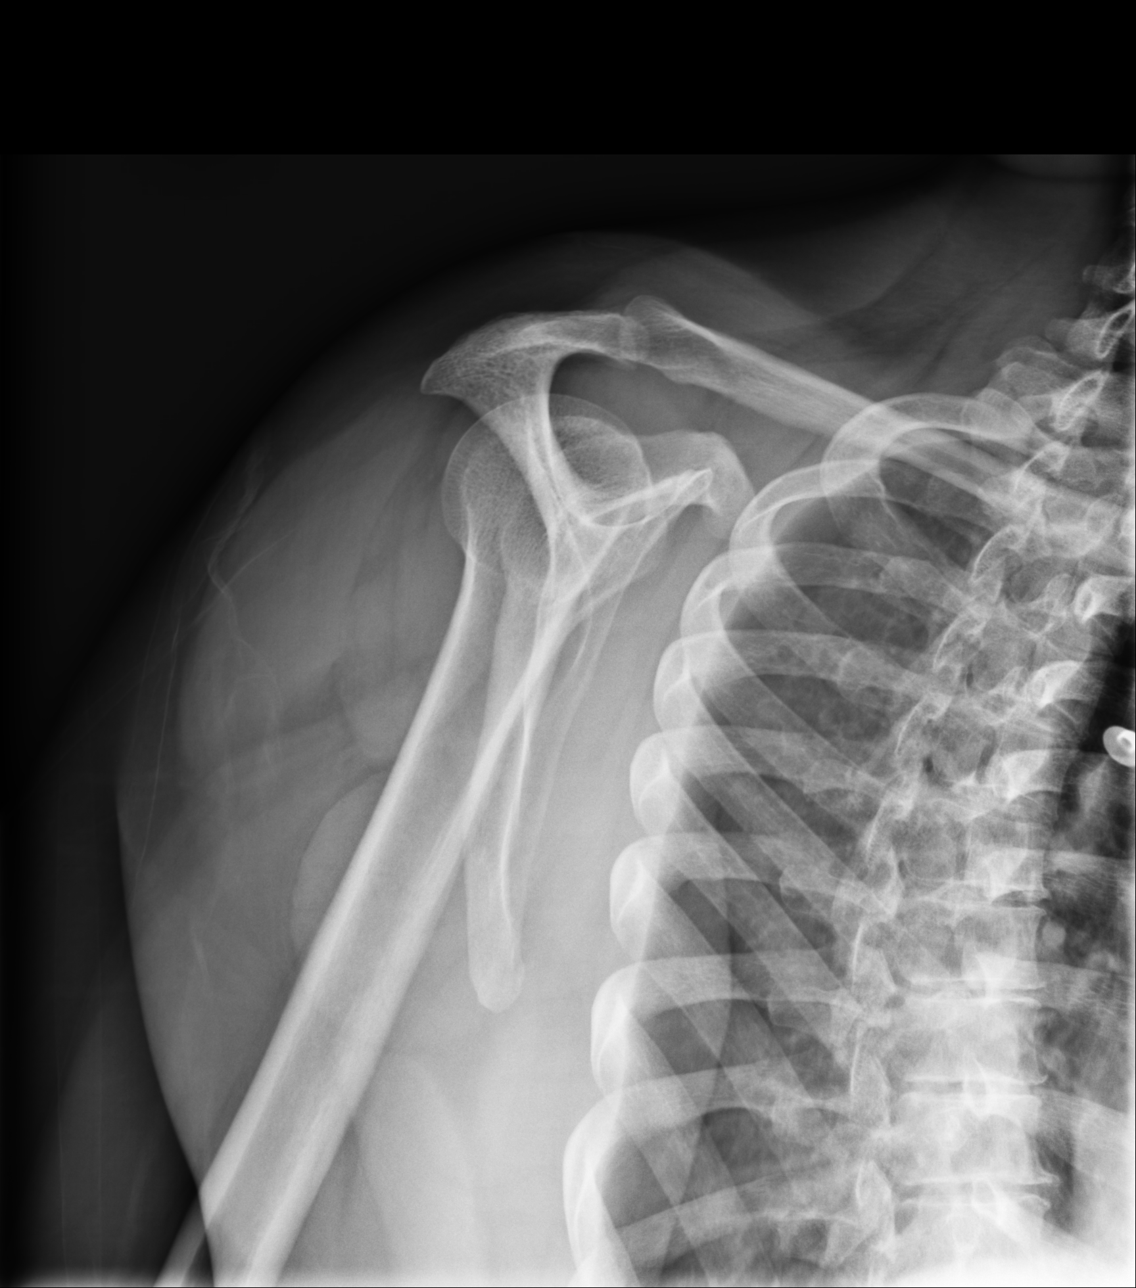

[3 of 3 positions shown; findings below may reference images not displayed]

IMPRESSION: Please see above.

[REDACTED]

## 2014-03-05 IMAGING — CR DG CHEST 2V
1 series · 2 of 2 positions shown · non-contrast
Comparison: none

REASON FOR EXAM: SOB
COMMENTS:

[Series 1: pa · 0.17mm/px · 2 of 2 slices shown]
[im 1/2]
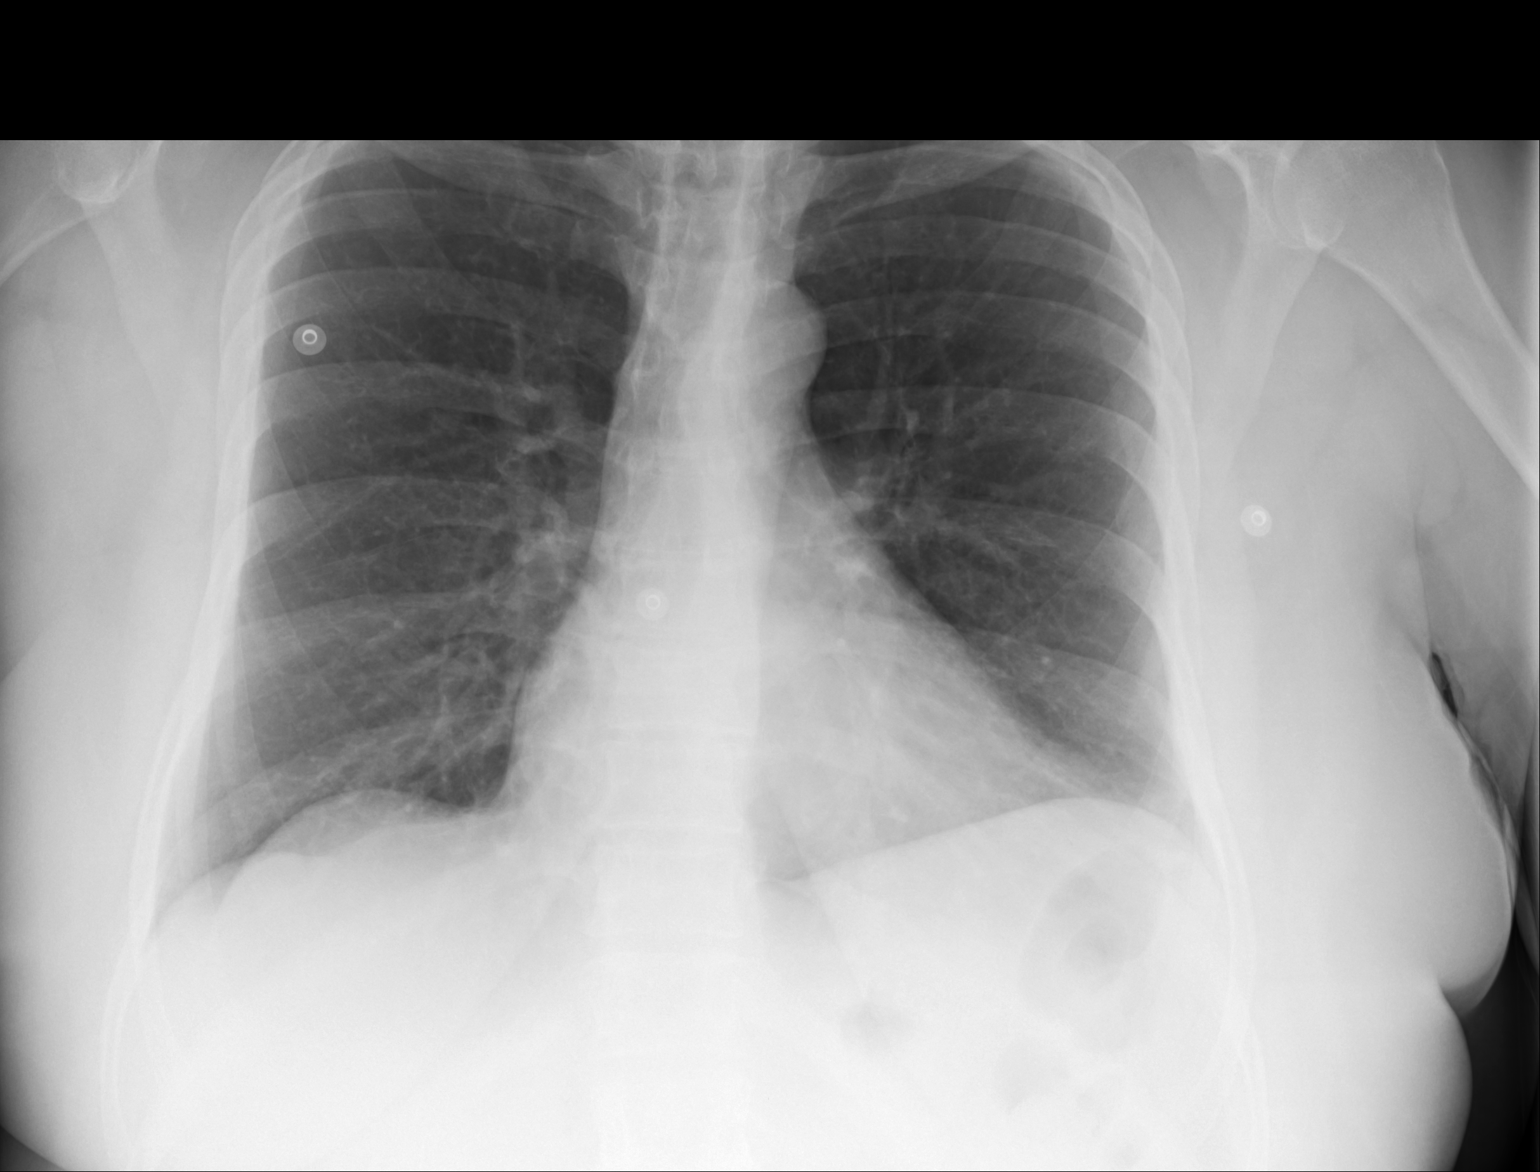
[im 2/2]
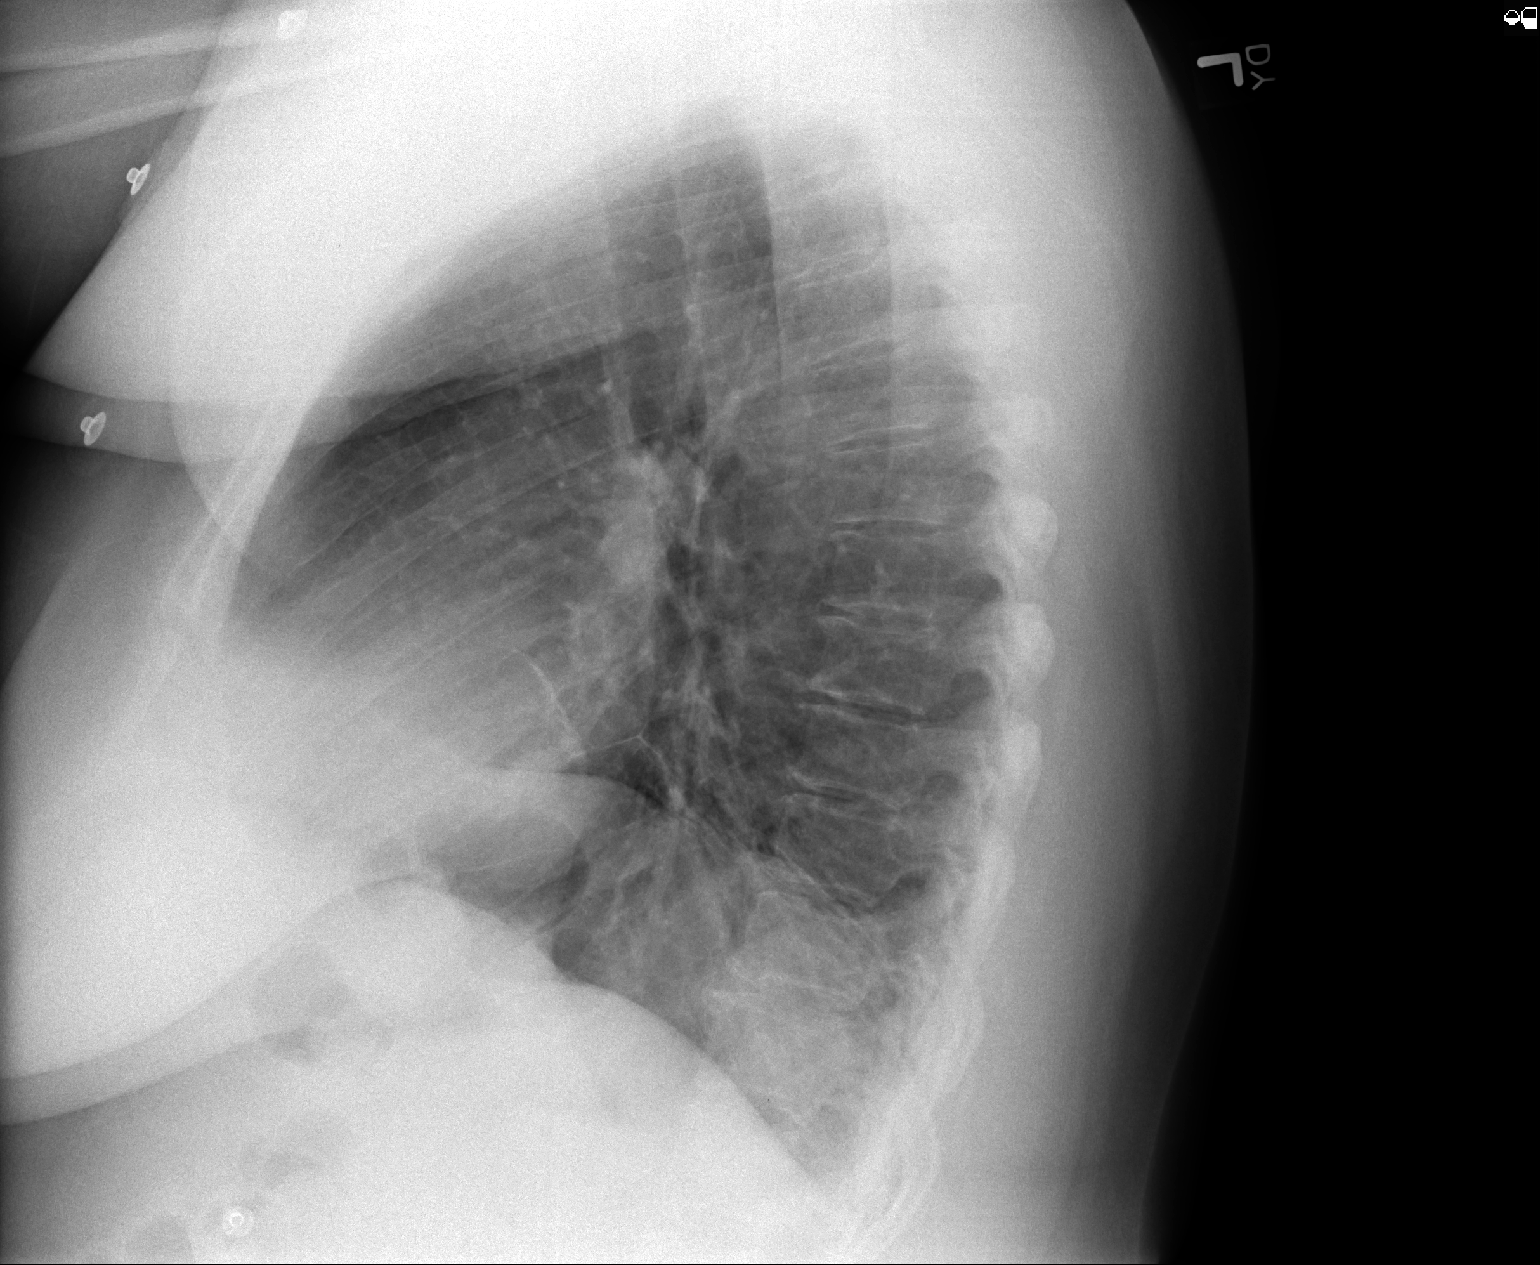

[2 of 2 positions shown; findings below may reference images not displayed]

PROCEDURE:     DXR - DXR CHEST PA (OR AP) AND LATERAL  - November 03, 2012  [DATE]

RESULT:     Comparison is made to the most recent exam 06 June, 2012.

The lungs are clear. The heart and pulmonary vessels are normal. The bony
and mediastinal structures are unremarkable. There is no effusion. There is
no pneumothorax or evidence of congestive failure.
IMPRESSION: No acute cardiopulmonary disease.

[REDACTED]

## 2014-03-24 DIAGNOSIS — J45901 Unspecified asthma with (acute) exacerbation: Secondary | ICD-10-CM | POA: Diagnosis not present

## 2014-03-24 DIAGNOSIS — J441 Chronic obstructive pulmonary disease with (acute) exacerbation: Secondary | ICD-10-CM | POA: Diagnosis not present

## 2014-03-24 DIAGNOSIS — J309 Allergic rhinitis, unspecified: Secondary | ICD-10-CM | POA: Diagnosis not present

## 2014-03-24 DIAGNOSIS — J019 Acute sinusitis, unspecified: Secondary | ICD-10-CM | POA: Diagnosis not present

## 2014-03-24 DIAGNOSIS — I10 Essential (primary) hypertension: Secondary | ICD-10-CM | POA: Diagnosis not present

## 2014-03-24 DIAGNOSIS — IMO0001 Reserved for inherently not codable concepts without codable children: Secondary | ICD-10-CM | POA: Diagnosis not present

## 2014-04-14 ENCOUNTER — Emergency Department: Payer: Self-pay | Admitting: Emergency Medicine

## 2014-04-14 DIAGNOSIS — J449 Chronic obstructive pulmonary disease, unspecified: Secondary | ICD-10-CM | POA: Diagnosis not present

## 2014-04-14 DIAGNOSIS — R0602 Shortness of breath: Secondary | ICD-10-CM | POA: Diagnosis not present

## 2014-04-14 DIAGNOSIS — Z79899 Other long term (current) drug therapy: Secondary | ICD-10-CM | POA: Diagnosis not present

## 2014-04-14 DIAGNOSIS — J45901 Unspecified asthma with (acute) exacerbation: Secondary | ICD-10-CM | POA: Diagnosis not present

## 2014-04-14 DIAGNOSIS — E119 Type 2 diabetes mellitus without complications: Secondary | ICD-10-CM | POA: Diagnosis not present

## 2014-04-14 DIAGNOSIS — I10 Essential (primary) hypertension: Secondary | ICD-10-CM | POA: Diagnosis not present

## 2014-04-14 DIAGNOSIS — F17200 Nicotine dependence, unspecified, uncomplicated: Secondary | ICD-10-CM | POA: Diagnosis not present

## 2014-04-14 DIAGNOSIS — R062 Wheezing: Secondary | ICD-10-CM | POA: Diagnosis not present

## 2014-04-24 IMAGING — CR DG CHEST 2V
1 series · 2 of 2 positions shown · non-contrast
Comparison: none

REASON FOR EXAM: SOB
COMMENTS:

[Series 1: w chest pa · 0.14mm/px · 2 of 2 slices shown]
[im 1/2]
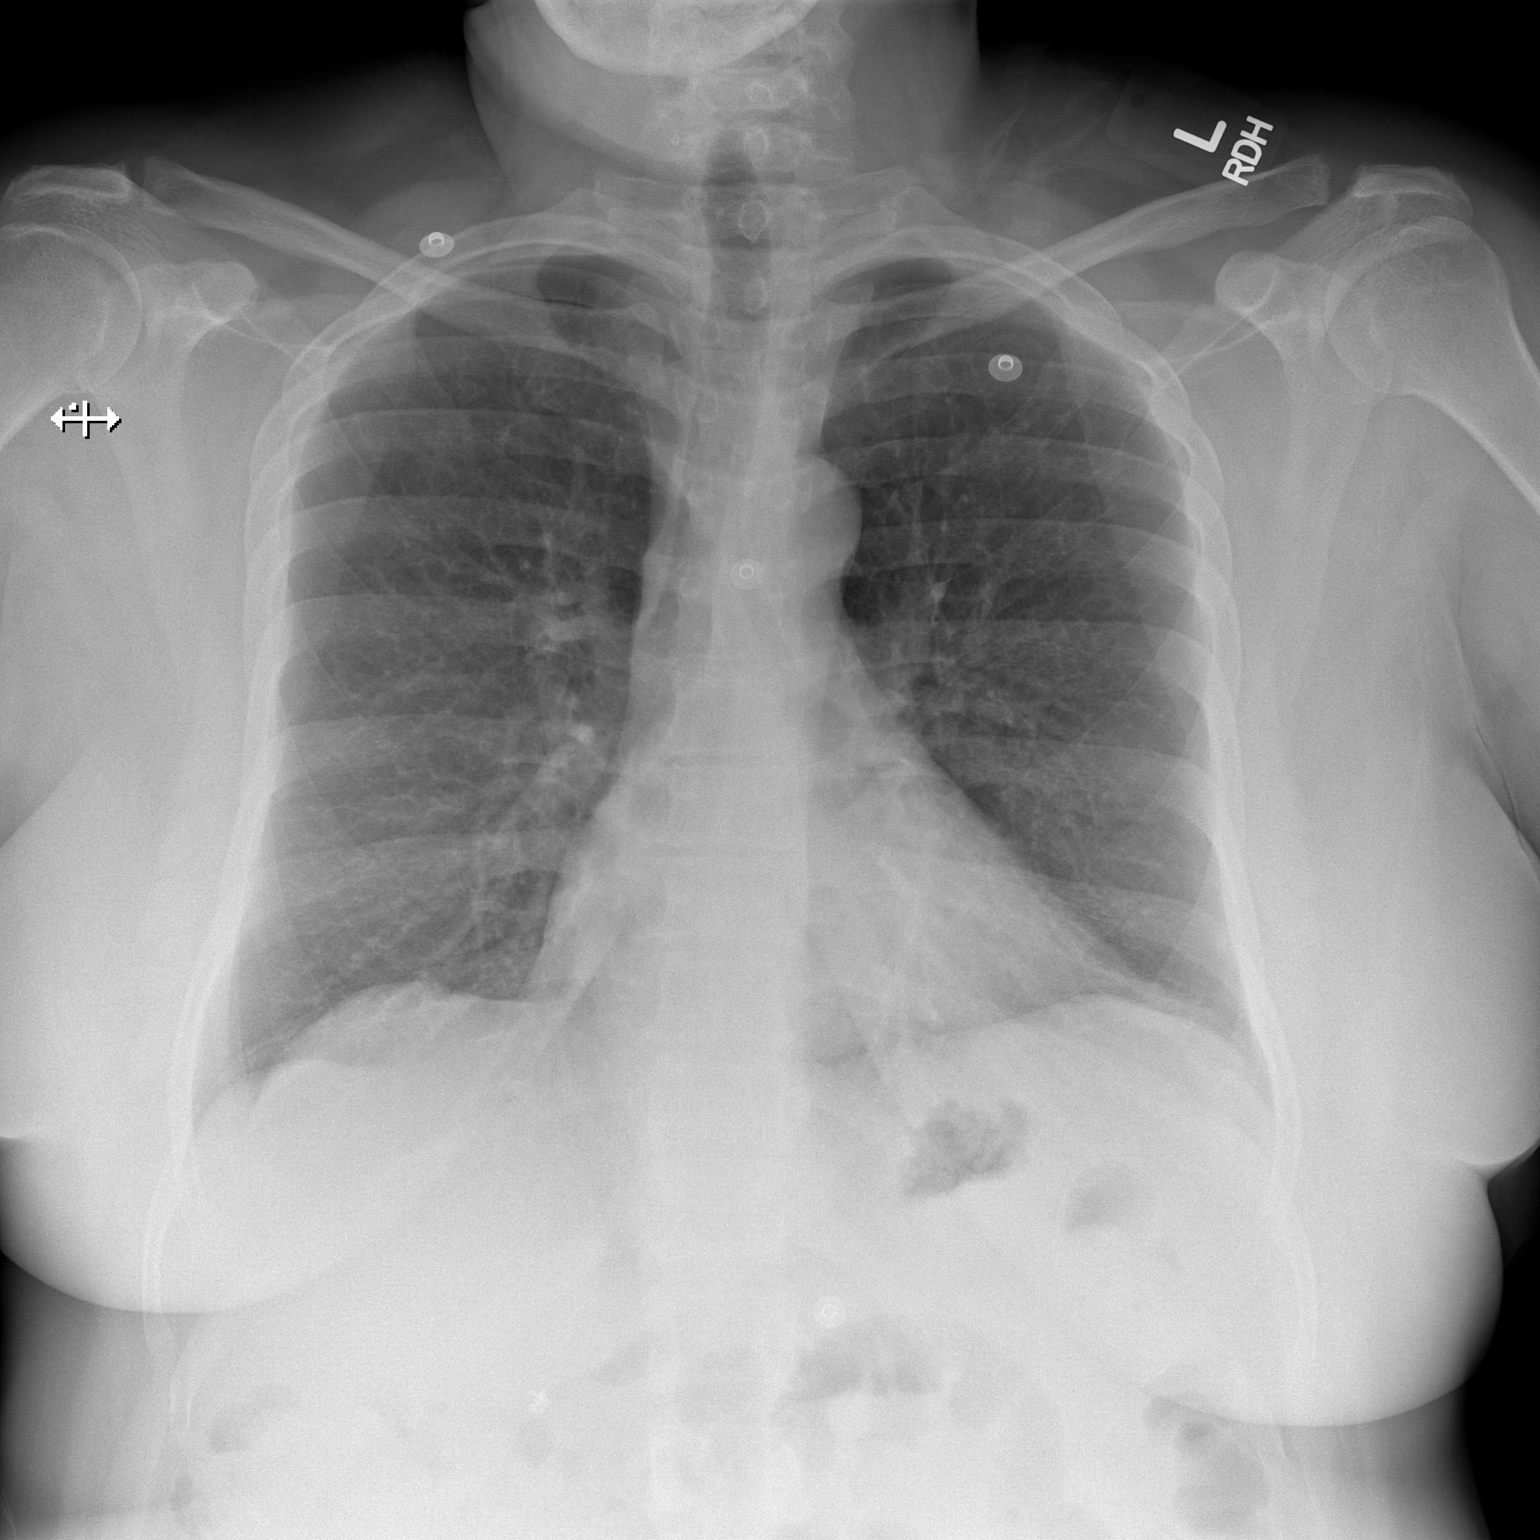
[im 2/2]
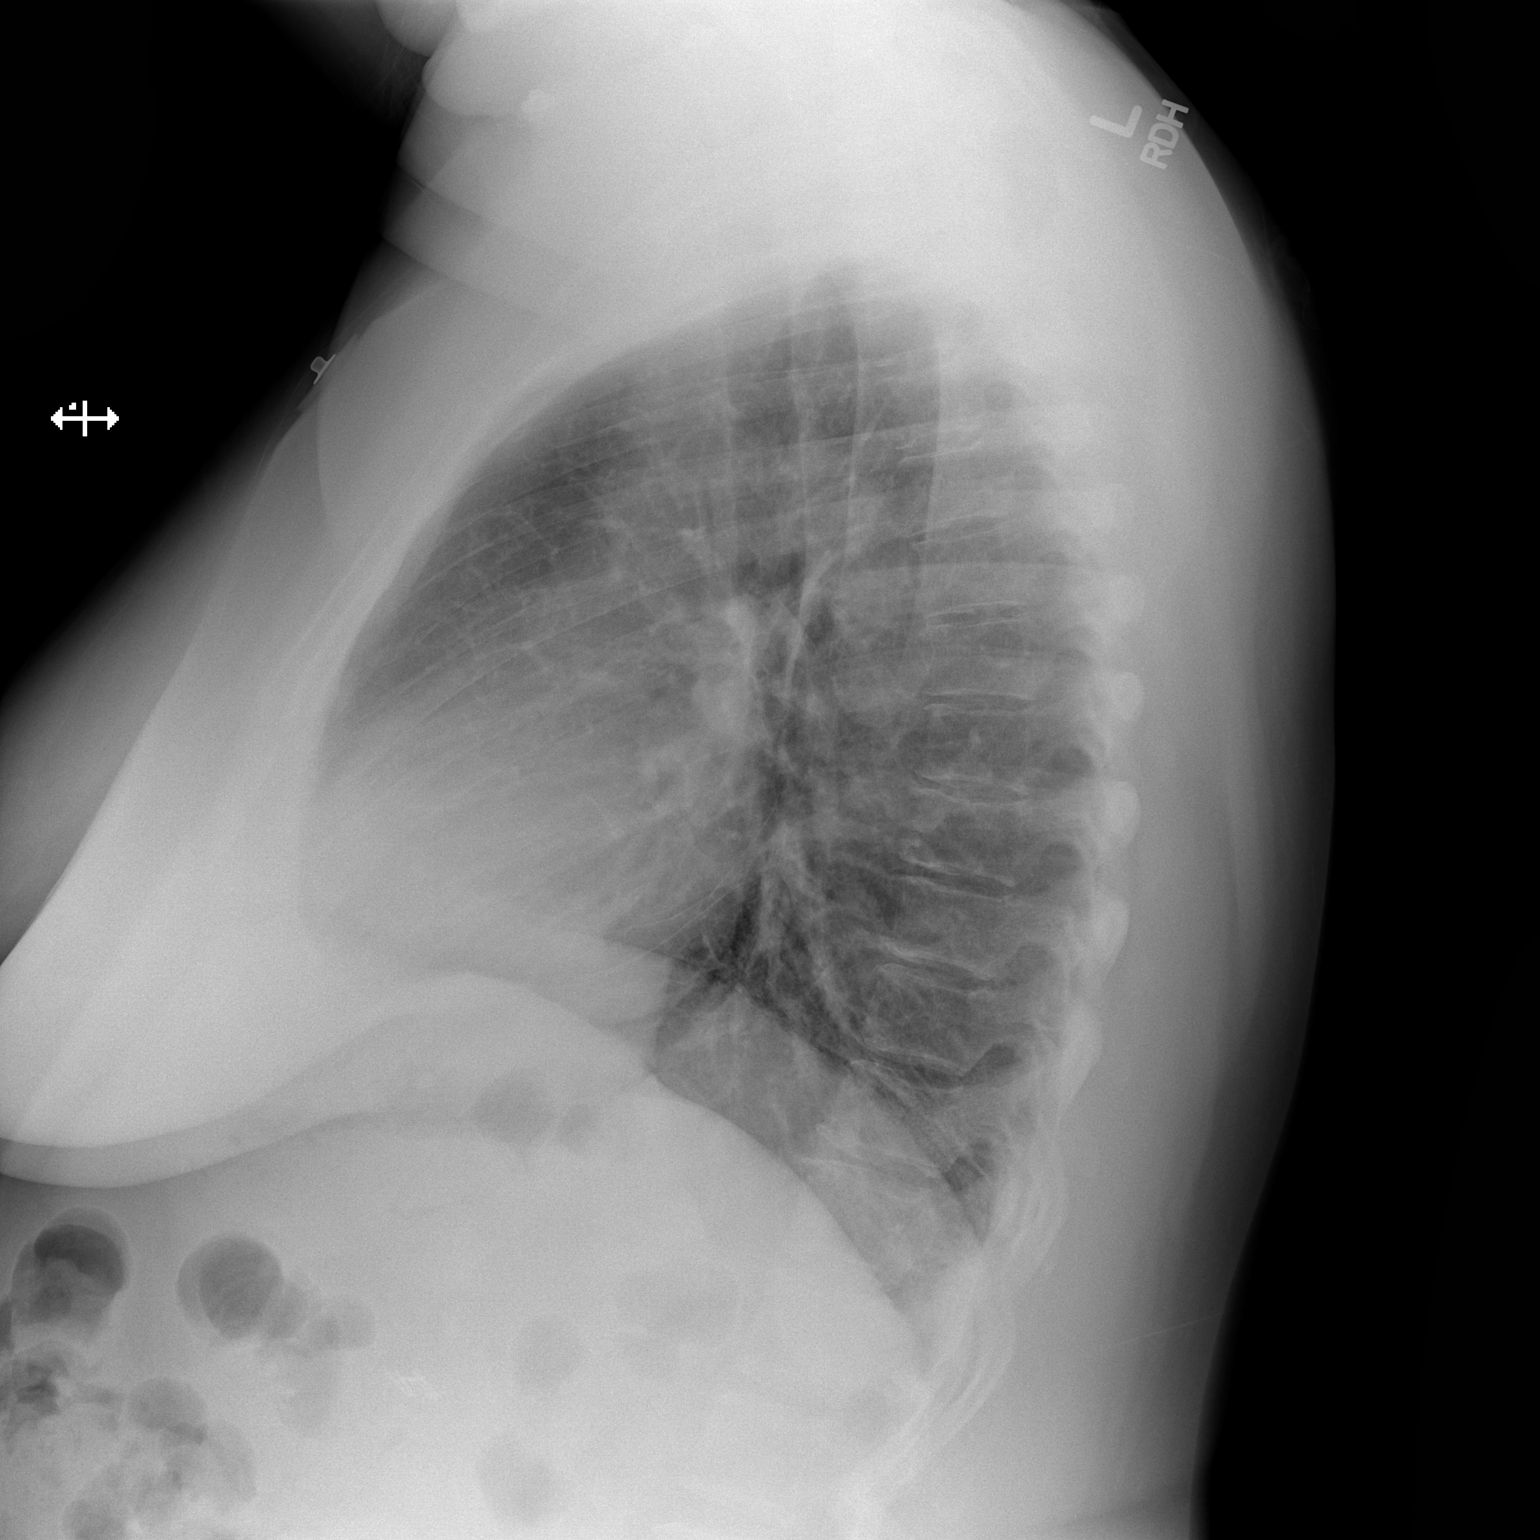

[2 of 2 positions shown; findings below may reference images not displayed]

PROCEDURE:     DXR - DXR CHEST PA (OR AP) AND LATERAL  - December 23, 2012  [DATE]

RESULT:     The lungs are well-expanded. The interstitial markings are
mildly increased bilaterally. The cardiac silhouette is top normal in size
but stable. The pulmonary vascularity is prominent centrally. There is no
pleural effusion.
IMPRESSION: The findings are consistent with reactive airway disease
with superimposed acute bronchitis. There is no focal pneumonia.

[REDACTED]

## 2014-05-04 ENCOUNTER — Emergency Department: Payer: Self-pay | Admitting: Emergency Medicine

## 2014-05-04 DIAGNOSIS — I10 Essential (primary) hypertension: Secondary | ICD-10-CM | POA: Diagnosis not present

## 2014-05-04 DIAGNOSIS — Z79899 Other long term (current) drug therapy: Secondary | ICD-10-CM | POA: Diagnosis not present

## 2014-05-04 DIAGNOSIS — J441 Chronic obstructive pulmonary disease with (acute) exacerbation: Secondary | ICD-10-CM | POA: Diagnosis not present

## 2014-05-04 DIAGNOSIS — R079 Chest pain, unspecified: Secondary | ICD-10-CM | POA: Diagnosis not present

## 2014-05-04 DIAGNOSIS — J45909 Unspecified asthma, uncomplicated: Secondary | ICD-10-CM | POA: Diagnosis not present

## 2014-05-04 DIAGNOSIS — R0602 Shortness of breath: Secondary | ICD-10-CM | POA: Diagnosis not present

## 2014-05-04 DIAGNOSIS — E119 Type 2 diabetes mellitus without complications: Secondary | ICD-10-CM | POA: Diagnosis not present

## 2014-05-04 LAB — CBC
HCT: 44.3 % (ref 35.0–47.0)
HGB: 14.7 g/dL (ref 12.0–16.0)
MCH: 29.2 pg (ref 26.0–34.0)
MCHC: 33.3 g/dL (ref 32.0–36.0)
MCV: 88 fL (ref 80–100)
Platelet: 307 10*3/uL (ref 150–440)
RBC: 5.04 10*6/uL (ref 3.80–5.20)
RDW: 14.7 % — AB (ref 11.5–14.5)
WBC: 8.8 10*3/uL (ref 3.6–11.0)

## 2014-05-04 LAB — BASIC METABOLIC PANEL
Anion Gap: 6 — ABNORMAL LOW (ref 7–16)
BUN: 16 mg/dL (ref 7–18)
CHLORIDE: 108 mmol/L — AB (ref 98–107)
Calcium, Total: 8.8 mg/dL (ref 8.5–10.1)
Co2: 23 mmol/L (ref 21–32)
Creatinine: 1.77 mg/dL — ABNORMAL HIGH (ref 0.60–1.30)
EGFR (African American): 38 — ABNORMAL LOW
EGFR (Non-African Amer.): 33 — ABNORMAL LOW
Glucose: 147 mg/dL — ABNORMAL HIGH (ref 65–99)
OSMOLALITY: 278 (ref 275–301)
Potassium: 3.8 mmol/L (ref 3.5–5.1)
Sodium: 137 mmol/L (ref 136–145)

## 2014-05-04 LAB — TROPONIN I

## 2014-05-05 IMAGING — CR DG CHEST 2V
1 series · 2 of 2 positions shown · non-contrast
Comparison: none

REASON FOR EXAM: sob
COMMENTS:

[Series 3: w chest pa · 0.14mm/px · 2 of 2 slices shown]
[im 1/2]
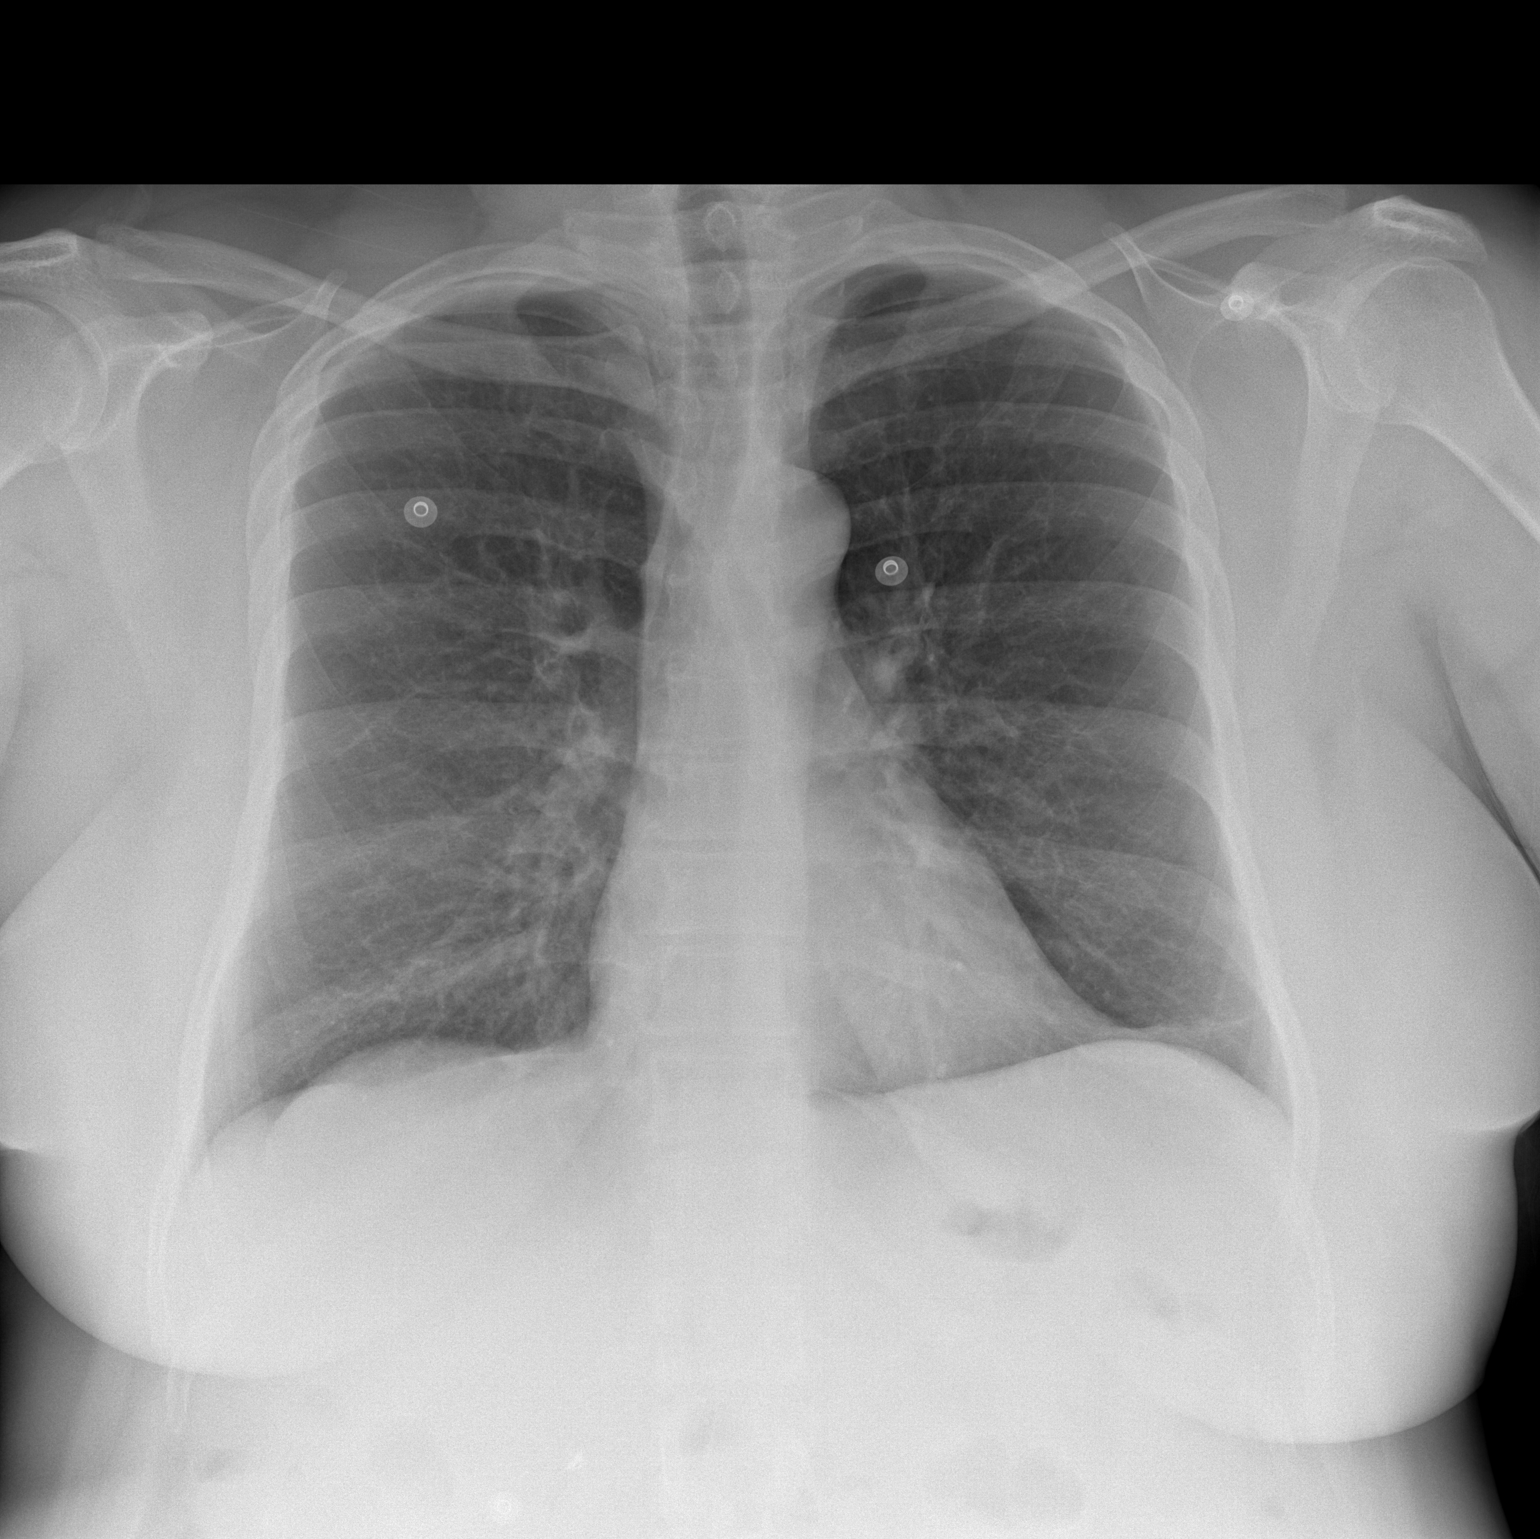
[im 2/2]
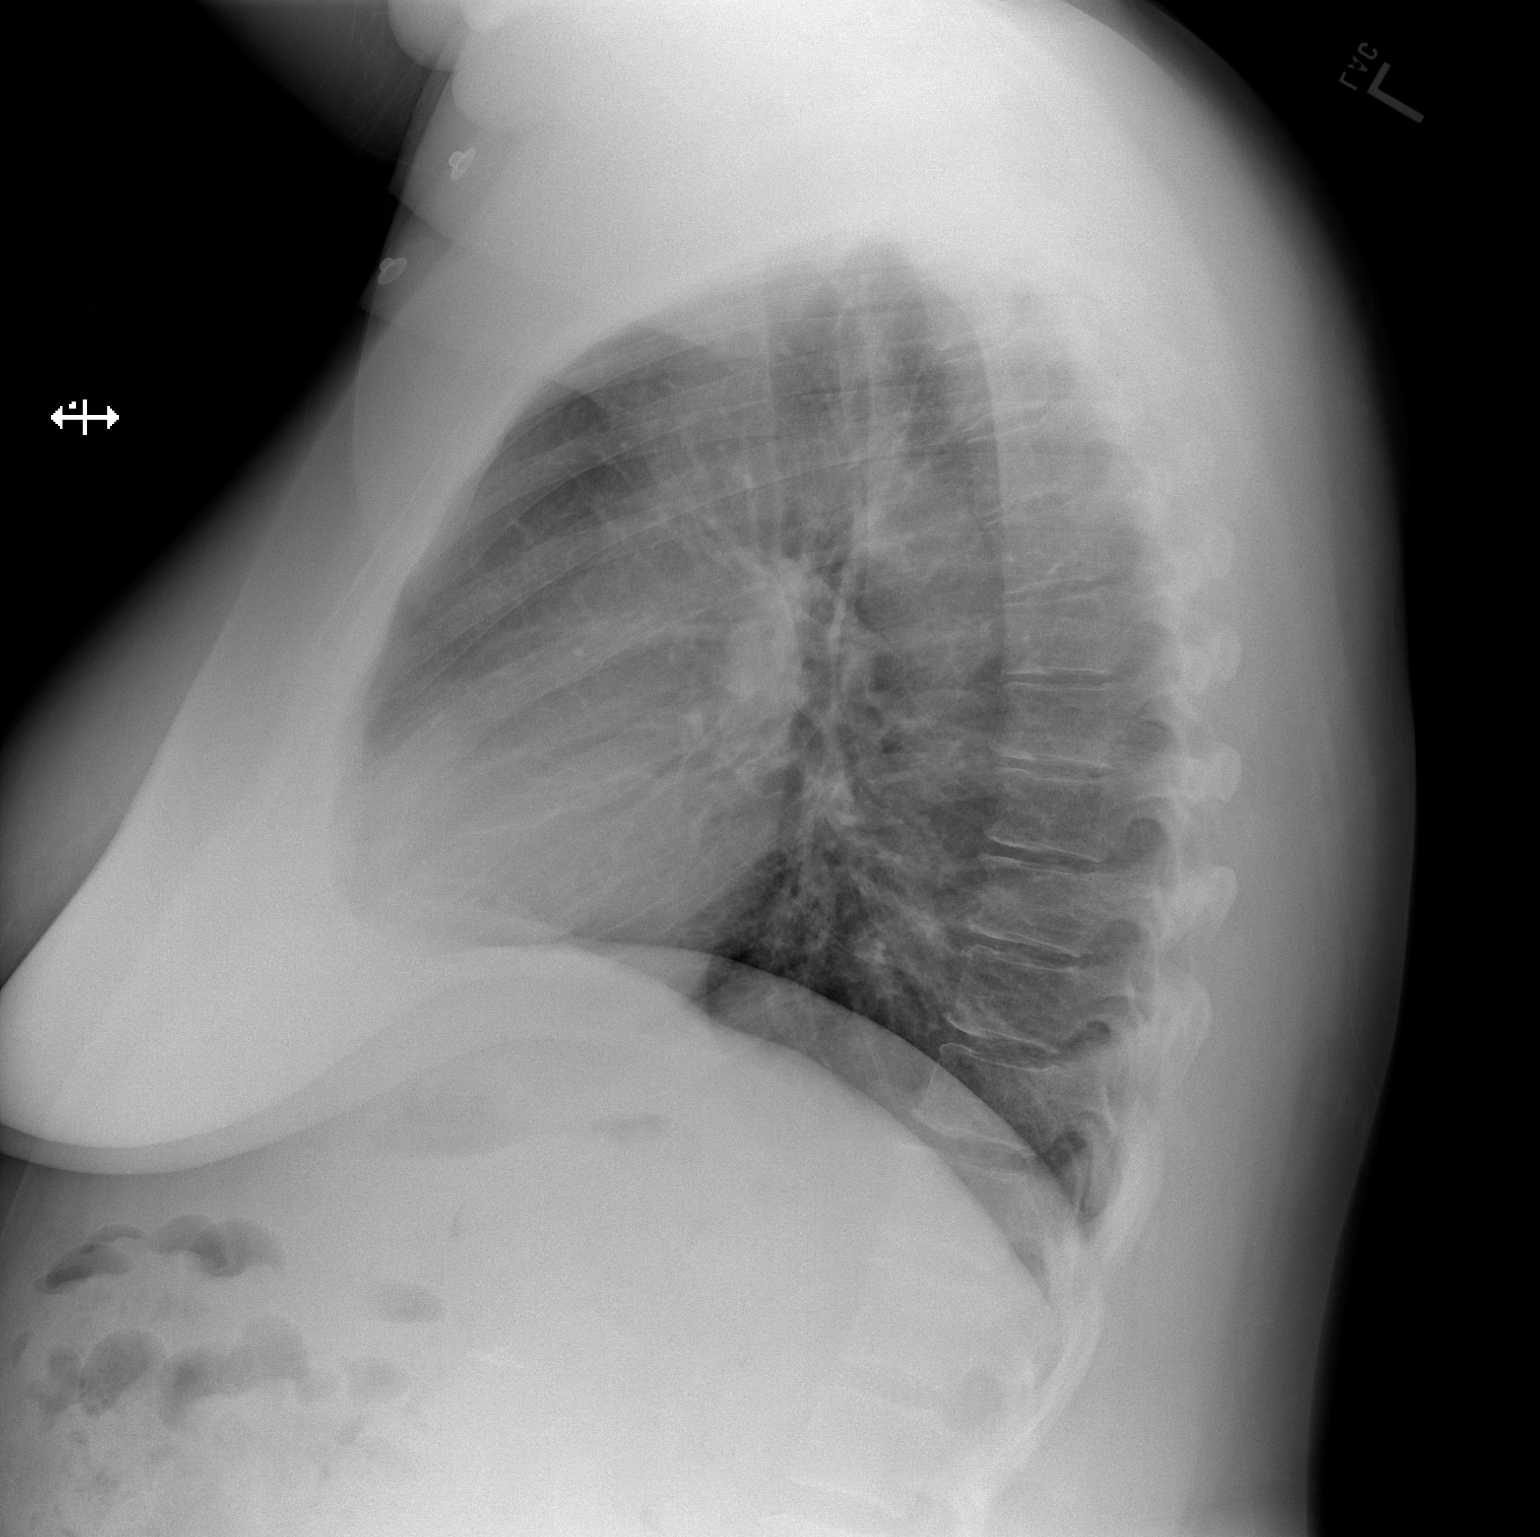

[2 of 2 positions shown; findings below may reference images not displayed]

PROCEDURE:     DXR - DXR CHEST PA (OR AP) AND LATERAL  - January 03, 2013  [DATE]

RESULT:     Comparison is made to the previous examination dated 23 December, 2012.

The lungs are clear. The heart and pulmonary vessels are normal. The bony
and mediastinal structures are unremarkable. There is no effusion. There is
no pneumothorax or evidence of congestive failure.
IMPRESSION: No acute cardiopulmonary disease..

[REDACTED]

## 2014-06-16 ENCOUNTER — Emergency Department: Payer: Self-pay | Admitting: Emergency Medicine

## 2014-06-16 DIAGNOSIS — J441 Chronic obstructive pulmonary disease with (acute) exacerbation: Secondary | ICD-10-CM | POA: Diagnosis not present

## 2014-06-16 DIAGNOSIS — R05 Cough: Secondary | ICD-10-CM | POA: Diagnosis not present

## 2014-06-16 DIAGNOSIS — Z79899 Other long term (current) drug therapy: Secondary | ICD-10-CM | POA: Diagnosis not present

## 2014-06-16 DIAGNOSIS — R0602 Shortness of breath: Secondary | ICD-10-CM | POA: Diagnosis not present

## 2014-06-16 DIAGNOSIS — R059 Cough, unspecified: Secondary | ICD-10-CM | POA: Diagnosis not present

## 2014-06-16 DIAGNOSIS — F17200 Nicotine dependence, unspecified, uncomplicated: Secondary | ICD-10-CM | POA: Diagnosis not present

## 2014-06-16 DIAGNOSIS — R0609 Other forms of dyspnea: Secondary | ICD-10-CM | POA: Diagnosis not present

## 2014-06-16 DIAGNOSIS — R0789 Other chest pain: Secondary | ICD-10-CM | POA: Diagnosis not present

## 2014-06-16 DIAGNOSIS — I10 Essential (primary) hypertension: Secondary | ICD-10-CM | POA: Diagnosis not present

## 2014-06-16 LAB — CBC
HCT: 43.5 % (ref 35.0–47.0)
HGB: 14.1 g/dL (ref 12.0–16.0)
MCH: 28.9 pg (ref 26.0–34.0)
MCHC: 32.3 g/dL (ref 32.0–36.0)
MCV: 89 fL (ref 80–100)
PLATELETS: 316 10*3/uL (ref 150–440)
RBC: 4.87 10*6/uL (ref 3.80–5.20)
RDW: 14.4 % (ref 11.5–14.5)
WBC: 8.6 10*3/uL (ref 3.6–11.0)

## 2014-06-16 LAB — COMPREHENSIVE METABOLIC PANEL
ALT: 39 U/L (ref 12–78)
Albumin: 3.1 g/dL — ABNORMAL LOW (ref 3.4–5.0)
Alkaline Phosphatase: 80 U/L
Anion Gap: 6 — ABNORMAL LOW (ref 7–16)
BILIRUBIN TOTAL: 0.3 mg/dL (ref 0.2–1.0)
BUN: 14 mg/dL (ref 7–18)
Calcium, Total: 8.6 mg/dL (ref 8.5–10.1)
Chloride: 109 mmol/L — ABNORMAL HIGH (ref 98–107)
Co2: 24 mmol/L (ref 21–32)
Creatinine: 1.46 mg/dL — ABNORMAL HIGH (ref 0.60–1.30)
GFR CALC AF AMER: 48 — AB
GFR CALC NON AF AMER: 42 — AB
Glucose: 113 mg/dL — ABNORMAL HIGH (ref 65–99)
Osmolality: 279 (ref 275–301)
Potassium: 3.8 mmol/L (ref 3.5–5.1)
SGOT(AST): 23 U/L (ref 15–37)
SODIUM: 139 mmol/L (ref 136–145)
Total Protein: 6.6 g/dL (ref 6.4–8.2)

## 2014-06-16 LAB — TROPONIN I

## 2014-06-28 ENCOUNTER — Inpatient Hospital Stay: Payer: Self-pay | Admitting: Internal Medicine

## 2014-06-28 DIAGNOSIS — J9620 Acute and chronic respiratory failure, unspecified whether with hypoxia or hypercapnia: Secondary | ICD-10-CM | POA: Diagnosis not present

## 2014-06-28 DIAGNOSIS — R0602 Shortness of breath: Secondary | ICD-10-CM | POA: Diagnosis not present

## 2014-06-28 DIAGNOSIS — I10 Essential (primary) hypertension: Secondary | ICD-10-CM | POA: Diagnosis not present

## 2014-06-28 DIAGNOSIS — J441 Chronic obstructive pulmonary disease with (acute) exacerbation: Secondary | ICD-10-CM | POA: Diagnosis not present

## 2014-06-28 DIAGNOSIS — G609 Hereditary and idiopathic neuropathy, unspecified: Secondary | ICD-10-CM | POA: Diagnosis present

## 2014-06-28 DIAGNOSIS — F17200 Nicotine dependence, unspecified, uncomplicated: Secondary | ICD-10-CM | POA: Diagnosis not present

## 2014-06-28 DIAGNOSIS — E119 Type 2 diabetes mellitus without complications: Secondary | ICD-10-CM | POA: Diagnosis not present

## 2014-06-28 DIAGNOSIS — N189 Chronic kidney disease, unspecified: Secondary | ICD-10-CM | POA: Diagnosis not present

## 2014-06-28 DIAGNOSIS — I129 Hypertensive chronic kidney disease with stage 1 through stage 4 chronic kidney disease, or unspecified chronic kidney disease: Secondary | ICD-10-CM | POA: Diagnosis not present

## 2014-06-28 LAB — COMPREHENSIVE METABOLIC PANEL
ALK PHOS: 88 U/L
ALT: 57 U/L (ref 12–78)
AST: 30 U/L (ref 15–37)
Albumin: 3.5 g/dL (ref 3.4–5.0)
Anion Gap: 11 (ref 7–16)
BUN: 13 mg/dL (ref 7–18)
Bilirubin,Total: 0.4 mg/dL (ref 0.2–1.0)
CO2: 22 mmol/L (ref 21–32)
Calcium, Total: 8.9 mg/dL (ref 8.5–10.1)
Chloride: 108 mmol/L — ABNORMAL HIGH (ref 98–107)
Creatinine: 1.7 mg/dL — ABNORMAL HIGH (ref 0.60–1.30)
EGFR (Non-African Amer.): 35 — ABNORMAL LOW
GFR CALC AF AMER: 40 — AB
GLUCOSE: 184 mg/dL — AB (ref 65–99)
Osmolality: 286 (ref 275–301)
POTASSIUM: 3.8 mmol/L (ref 3.5–5.1)
SODIUM: 141 mmol/L (ref 136–145)
Total Protein: 7.2 g/dL (ref 6.4–8.2)

## 2014-06-28 LAB — TROPONIN I

## 2014-06-28 LAB — CBC
HCT: 45.8 % (ref 35.0–47.0)
HGB: 14.8 g/dL (ref 12.0–16.0)
MCH: 29 pg (ref 26.0–34.0)
MCHC: 32.2 g/dL (ref 32.0–36.0)
MCV: 90 fL (ref 80–100)
PLATELETS: 328 10*3/uL (ref 150–440)
RBC: 5.1 10*6/uL (ref 3.80–5.20)
RDW: 14 % (ref 11.5–14.5)
WBC: 10.2 10*3/uL (ref 3.6–11.0)

## 2014-06-28 LAB — PRO B NATRIURETIC PEPTIDE: B-TYPE NATIURETIC PEPTID: 71 pg/mL (ref 0–125)

## 2014-06-28 LAB — CK TOTAL AND CKMB (NOT AT ARMC)
CK, TOTAL: 273 U/L — AB
CK-MB: 3.8 ng/mL — ABNORMAL HIGH (ref 0.5–3.6)

## 2014-06-29 LAB — MAGNESIUM: Magnesium: 1.9 mg/dL

## 2014-06-29 LAB — CBC WITH DIFFERENTIAL/PLATELET
BASOS ABS: 0 10*3/uL (ref 0.0–0.1)
Basophil %: 0.2 %
EOS PCT: 0 %
Eosinophil #: 0 10*3/uL (ref 0.0–0.7)
HCT: 44.6 % (ref 35.0–47.0)
HGB: 14.4 g/dL (ref 12.0–16.0)
LYMPHS PCT: 12.2 %
Lymphocyte #: 0.8 10*3/uL — ABNORMAL LOW (ref 1.0–3.6)
MCH: 29.1 pg (ref 26.0–34.0)
MCHC: 32.3 g/dL (ref 32.0–36.0)
MCV: 90 fL (ref 80–100)
Monocyte #: 0 x10 3/mm — ABNORMAL LOW (ref 0.2–0.9)
Monocyte %: 0.7 %
NEUTROS ABS: 5.9 10*3/uL (ref 1.4–6.5)
NEUTROS PCT: 86.9 %
PLATELETS: 317 10*3/uL (ref 150–440)
RBC: 4.95 10*6/uL (ref 3.80–5.20)
RDW: 14.2 % (ref 11.5–14.5)
WBC: 6.7 10*3/uL (ref 3.6–11.0)

## 2014-06-29 LAB — BASIC METABOLIC PANEL
ANION GAP: 11 (ref 7–16)
BUN: 15 mg/dL (ref 7–18)
CHLORIDE: 106 mmol/L (ref 98–107)
CO2: 22 mmol/L (ref 21–32)
Calcium, Total: 9.3 mg/dL (ref 8.5–10.1)
Creatinine: 1.79 mg/dL — ABNORMAL HIGH (ref 0.60–1.30)
EGFR (Non-African Amer.): 33 — ABNORMAL LOW
GFR CALC AF AMER: 38 — AB
Glucose: 215 mg/dL — ABNORMAL HIGH (ref 65–99)
OSMOLALITY: 285 (ref 275–301)
Potassium: 4.5 mmol/L (ref 3.5–5.1)
Sodium: 139 mmol/L (ref 136–145)

## 2014-07-03 LAB — CULTURE, BLOOD (SINGLE)

## 2014-07-09 IMAGING — CR DG CHEST 1V PORT
1 series · 1 of 1 positions shown · non-contrast
Comparison: none

REASON FOR EXAM: sob
COMMENTS:

PROCEDURE:     DXR - DXR PORTABLE CHEST SINGLE VIEW  - March 09, 2013 [DATE]
RESULT:     The lungs are clear. The cardiac silhouette and visualized bony
skeleton are unremarkable.

[ap]
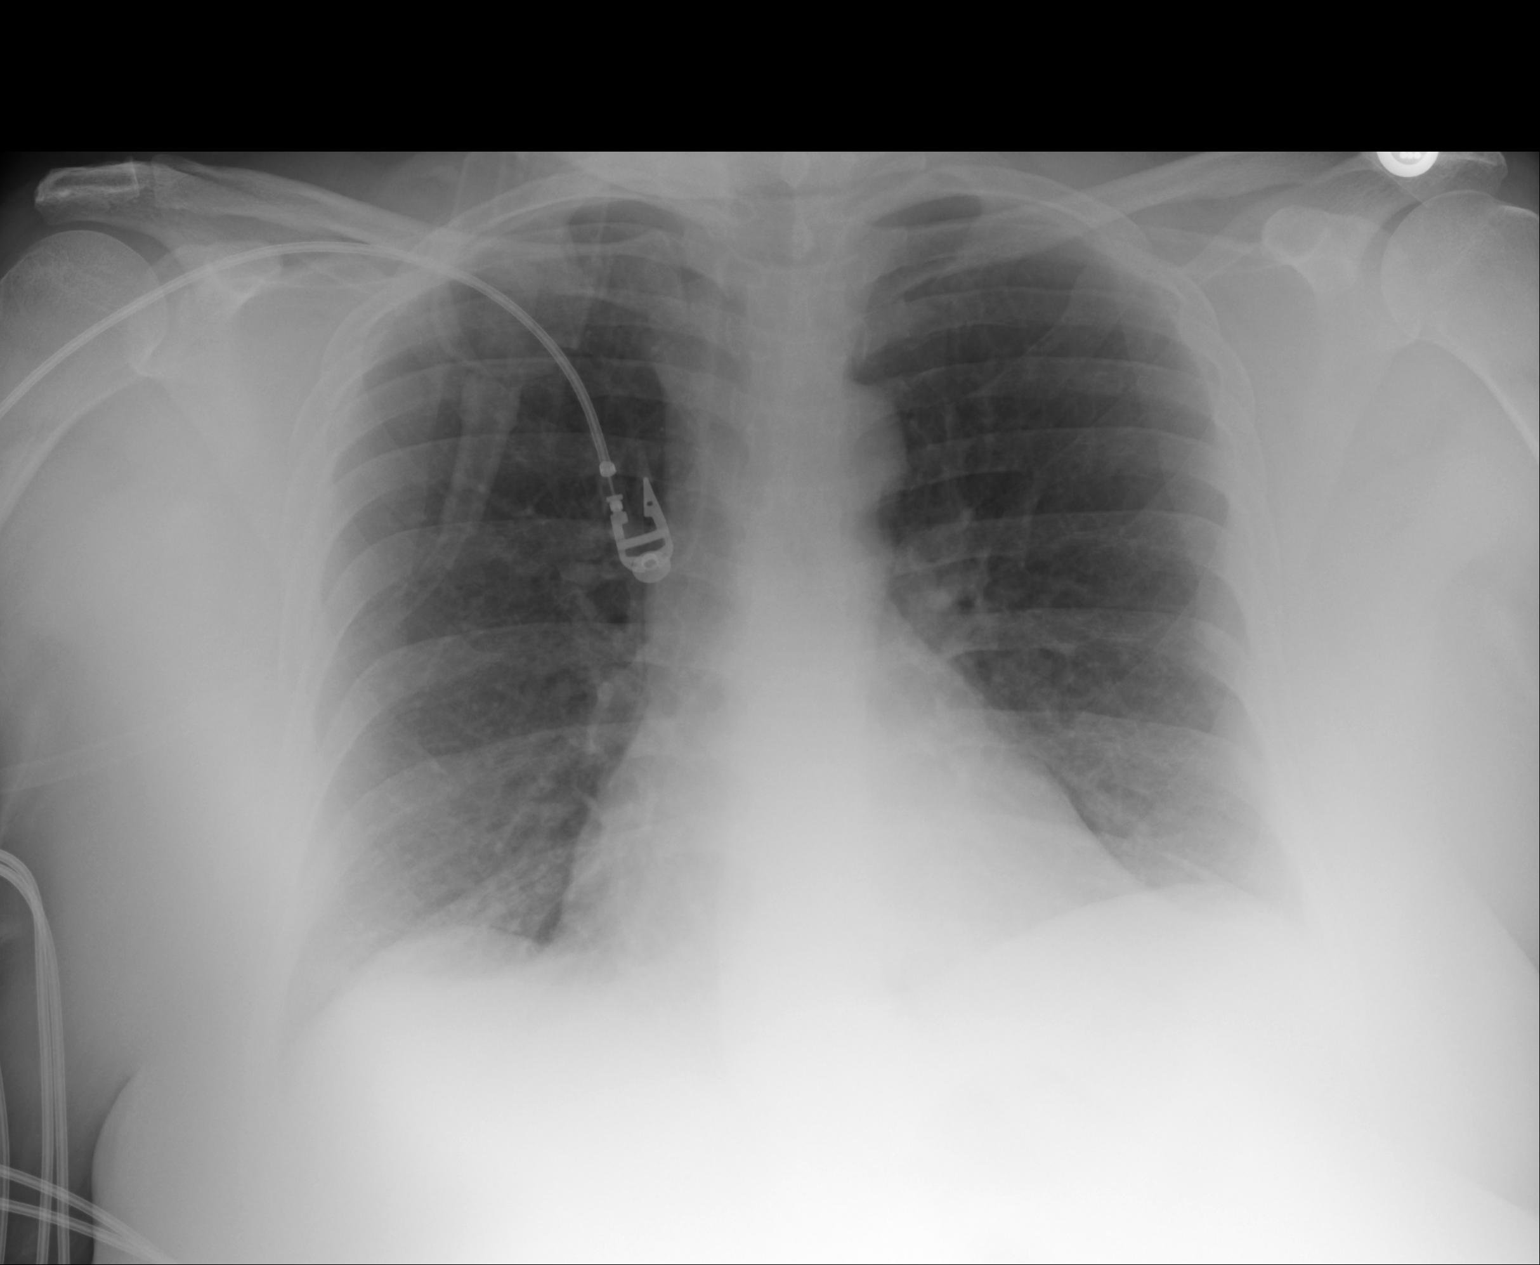

[1 of 1 positions shown; findings below may reference images not displayed]

IMPRESSION: 1. Chest radiograph without evidence of acute cardiopulmonary disease.
2. Comparison made to prior study dated 01/31/2013.

## 2014-08-04 DIAGNOSIS — R0602 Shortness of breath: Secondary | ICD-10-CM | POA: Diagnosis not present

## 2014-08-04 DIAGNOSIS — J441 Chronic obstructive pulmonary disease with (acute) exacerbation: Secondary | ICD-10-CM | POA: Diagnosis not present

## 2014-09-25 IMAGING — CR DG CHEST 2V
1 series · 2 of 2 positions shown · non-contrast
Comparison: none

REASON FOR EXAM: shortness of breath
COMMENTS:

[Series 1: pa · 0.17mm/px · 2 of 2 slices shown]
[im 1/2]
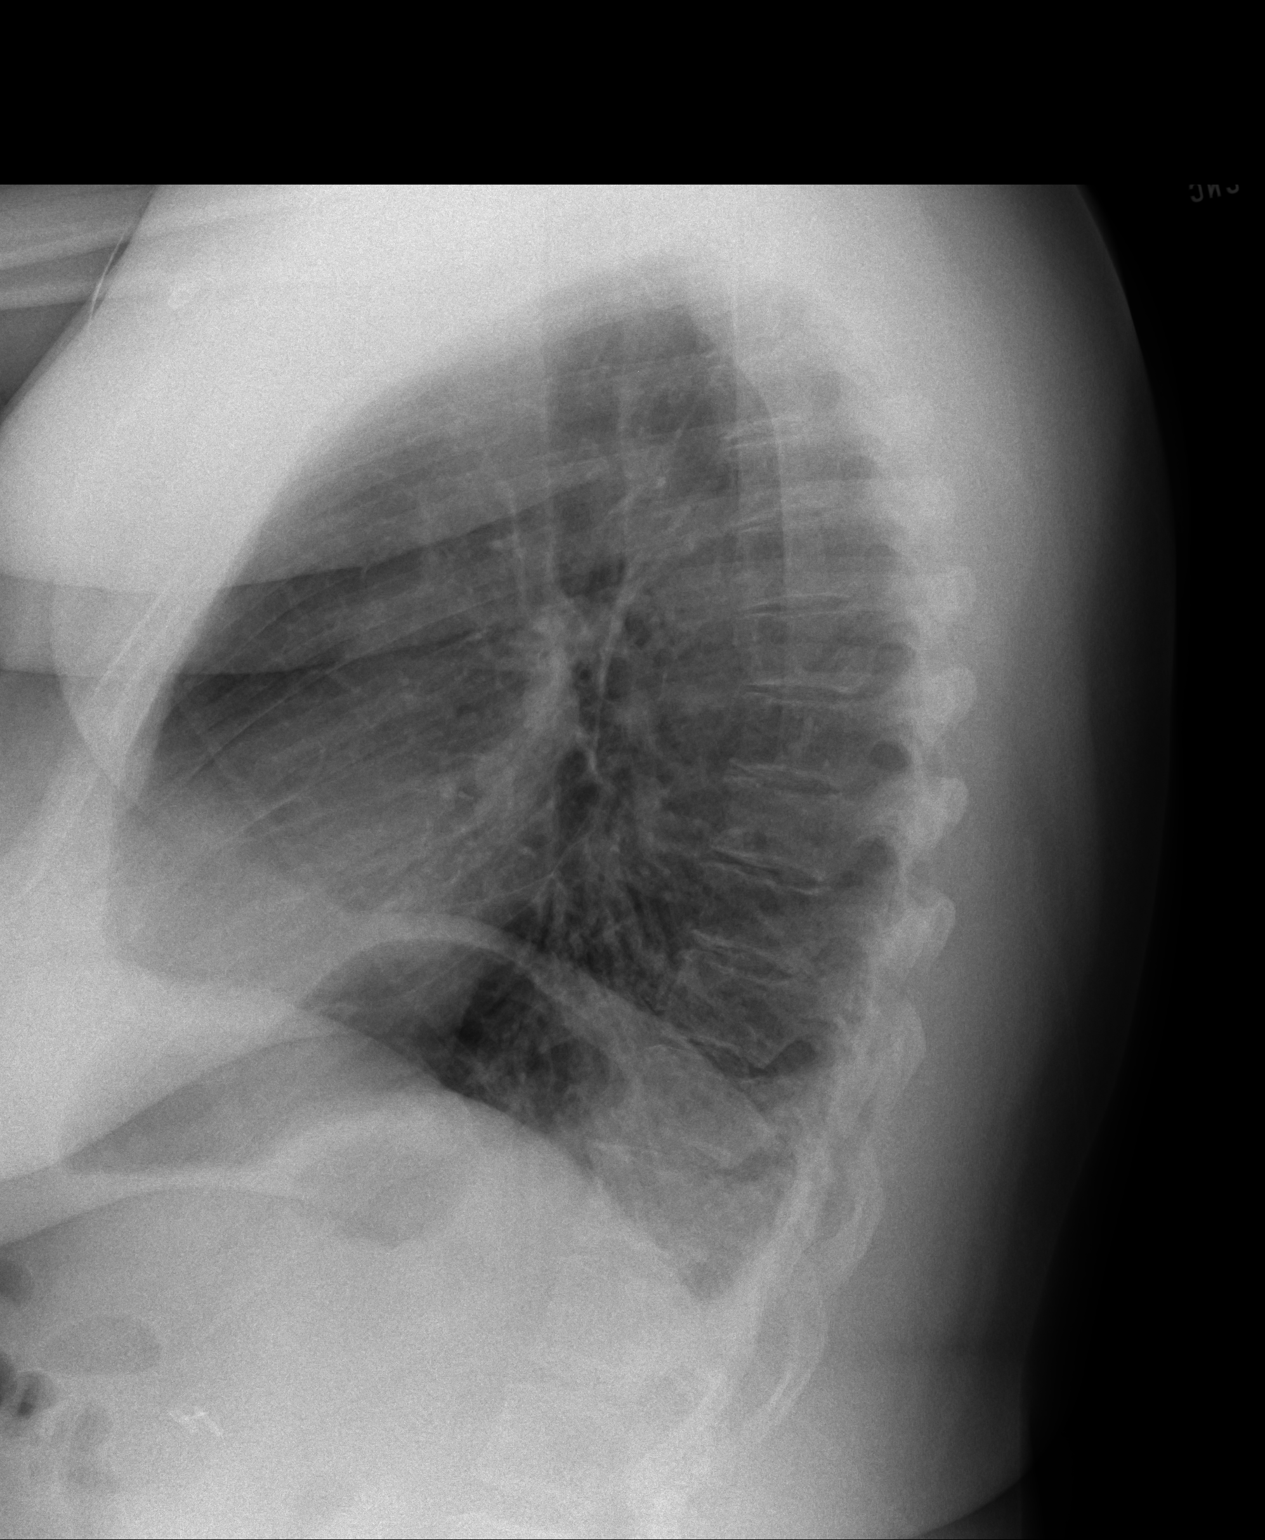
[im 2/2]
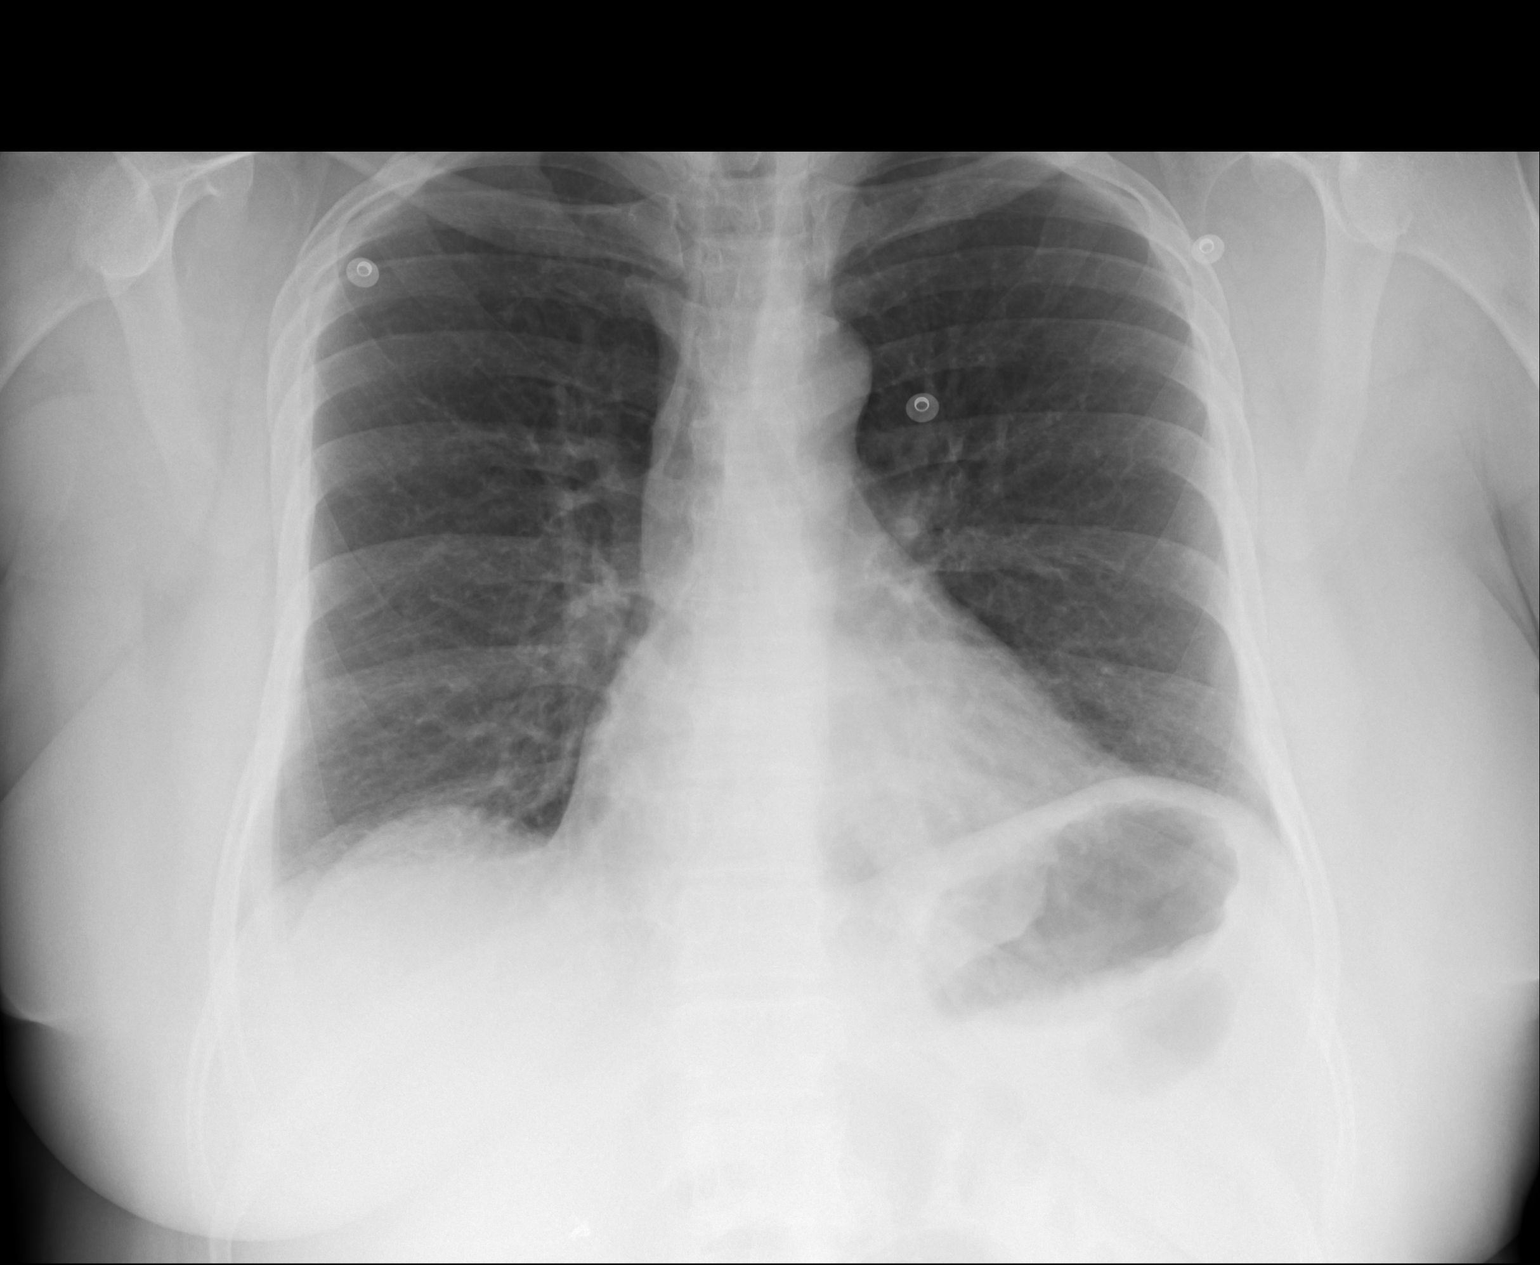

[2 of 2 positions shown; findings below may reference images not displayed]

PROCEDURE:     DXR - DXR CHEST PA (OR AP) AND LATERAL  - May 26, 2013 [DATE]

RESULT:     Comparison is made to the study of 03/09/2013.

There is mild elevation of the left hemidiaphragm. There is some increased
density in the retrocardiac region of the left lung base paraspinal region
which could represent atelectasis or minimal or early pneumonia. Correlate
with clinical and laboratory data. The lungs are otherwise clear. There is
no edema, effusion or pneumothorax. Cardiac silhouette is normal.
IMPRESSION: Minimal left lower lobe early pneumonia versus atelectasis.

[REDACTED]

## 2014-10-23 ENCOUNTER — Emergency Department: Payer: Self-pay | Admitting: Emergency Medicine

## 2014-10-23 DIAGNOSIS — R0602 Shortness of breath: Secondary | ICD-10-CM | POA: Diagnosis not present

## 2014-10-23 DIAGNOSIS — R0689 Other abnormalities of breathing: Secondary | ICD-10-CM | POA: Diagnosis not present

## 2014-10-23 DIAGNOSIS — R05 Cough: Secondary | ICD-10-CM | POA: Diagnosis not present

## 2014-10-23 DIAGNOSIS — I10 Essential (primary) hypertension: Secondary | ICD-10-CM | POA: Diagnosis not present

## 2014-10-23 DIAGNOSIS — Z7951 Long term (current) use of inhaled steroids: Secondary | ICD-10-CM | POA: Diagnosis not present

## 2014-10-23 DIAGNOSIS — Z720 Tobacco use: Secondary | ICD-10-CM | POA: Diagnosis not present

## 2014-10-23 DIAGNOSIS — J441 Chronic obstructive pulmonary disease with (acute) exacerbation: Secondary | ICD-10-CM | POA: Diagnosis not present

## 2014-10-23 DIAGNOSIS — Z79899 Other long term (current) drug therapy: Secondary | ICD-10-CM | POA: Diagnosis not present

## 2014-10-23 LAB — BASIC METABOLIC PANEL
ANION GAP: 6 — AB (ref 7–16)
BUN: 33 mg/dL — ABNORMAL HIGH (ref 7–18)
CO2: 25 mmol/L (ref 21–32)
Calcium, Total: 9.2 mg/dL (ref 8.5–10.1)
Chloride: 108 mmol/L — ABNORMAL HIGH (ref 98–107)
Creatinine: 1.69 mg/dL — ABNORMAL HIGH (ref 0.60–1.30)
EGFR (African American): 41 — ABNORMAL LOW
EGFR (Non-African Amer.): 34 — ABNORMAL LOW
Glucose: 121 mg/dL — ABNORMAL HIGH (ref 65–99)
Osmolality: 286 (ref 275–301)
Potassium: 4.6 mmol/L (ref 3.5–5.1)
Sodium: 139 mmol/L (ref 136–145)

## 2014-10-23 LAB — CBC WITH DIFFERENTIAL/PLATELET
BASOS ABS: 0 10*3/uL (ref 0.0–0.1)
Basophil %: 0.6 %
EOS ABS: 0.5 10*3/uL (ref 0.0–0.7)
Eosinophil %: 5.4 %
HCT: 48.1 % — ABNORMAL HIGH (ref 35.0–47.0)
HGB: 15.5 g/dL (ref 12.0–16.0)
Lymphocyte #: 2.7 10*3/uL (ref 1.0–3.6)
Lymphocyte %: 30.6 %
MCH: 29.2 pg (ref 26.0–34.0)
MCHC: 32.3 g/dL (ref 32.0–36.0)
MCV: 90 fL (ref 80–100)
MONOS PCT: 4.4 %
Monocyte #: 0.4 x10 3/mm (ref 0.2–0.9)
NEUTROS ABS: 5.2 10*3/uL (ref 1.4–6.5)
Neutrophil %: 59 %
Platelet: 361 10*3/uL (ref 150–440)
RBC: 5.32 10*6/uL — ABNORMAL HIGH (ref 3.80–5.20)
RDW: 13.9 % (ref 11.5–14.5)
WBC: 8.7 10*3/uL (ref 3.6–11.0)

## 2014-12-07 ENCOUNTER — Emergency Department: Payer: Self-pay | Admitting: Emergency Medicine

## 2014-12-07 DIAGNOSIS — R0689 Other abnormalities of breathing: Secondary | ICD-10-CM | POA: Diagnosis not present

## 2014-12-07 DIAGNOSIS — F419 Anxiety disorder, unspecified: Secondary | ICD-10-CM | POA: Diagnosis not present

## 2014-12-07 DIAGNOSIS — J441 Chronic obstructive pulmonary disease with (acute) exacerbation: Secondary | ICD-10-CM | POA: Diagnosis not present

## 2014-12-07 DIAGNOSIS — R0789 Other chest pain: Secondary | ICD-10-CM | POA: Diagnosis not present

## 2014-12-07 DIAGNOSIS — R0602 Shortness of breath: Secondary | ICD-10-CM | POA: Diagnosis not present

## 2014-12-07 DIAGNOSIS — Z720 Tobacco use: Secondary | ICD-10-CM | POA: Diagnosis not present

## 2014-12-07 LAB — CBC
HCT: 44.6 % (ref 35.0–47.0)
HGB: 14.3 g/dL (ref 12.0–16.0)
MCH: 28.6 pg (ref 26.0–34.0)
MCHC: 32.1 g/dL (ref 32.0–36.0)
MCV: 89 fL (ref 80–100)
PLATELETS: 324 10*3/uL (ref 150–440)
RBC: 5.01 10*6/uL (ref 3.80–5.20)
RDW: 13.8 % (ref 11.5–14.5)
WBC: 8.9 10*3/uL (ref 3.6–11.0)

## 2014-12-07 LAB — COMPREHENSIVE METABOLIC PANEL
ALBUMIN: 3.3 g/dL — AB (ref 3.4–5.0)
ALK PHOS: 84 U/L
Anion Gap: 5 — ABNORMAL LOW (ref 7–16)
BILIRUBIN TOTAL: 0.3 mg/dL (ref 0.2–1.0)
BUN: 9 mg/dL (ref 7–18)
CALCIUM: 8.5 mg/dL (ref 8.5–10.1)
Chloride: 110 mmol/L — ABNORMAL HIGH (ref 98–107)
Co2: 27 mmol/L (ref 21–32)
Creatinine: 1.53 mg/dL — ABNORMAL HIGH (ref 0.60–1.30)
EGFR (African American): 46 — ABNORMAL LOW
EGFR (Non-African Amer.): 38 — ABNORMAL LOW
Glucose: 109 mg/dL — ABNORMAL HIGH (ref 65–99)
Osmolality: 282 (ref 275–301)
Potassium: 3.6 mmol/L (ref 3.5–5.1)
SGOT(AST): 16 U/L (ref 15–37)
SGPT (ALT): 30 U/L
Sodium: 142 mmol/L (ref 136–145)
TOTAL PROTEIN: 6.6 g/dL (ref 6.4–8.2)

## 2014-12-07 LAB — PRO B NATRIURETIC PEPTIDE: B-Type Natriuretic Peptide: 444 pg/mL — ABNORMAL HIGH (ref 0–125)

## 2014-12-07 LAB — TROPONIN I

## 2014-12-22 DIAGNOSIS — J301 Allergic rhinitis due to pollen: Secondary | ICD-10-CM | POA: Diagnosis not present

## 2014-12-22 DIAGNOSIS — E1165 Type 2 diabetes mellitus with hyperglycemia: Secondary | ICD-10-CM | POA: Diagnosis not present

## 2014-12-22 DIAGNOSIS — F17210 Nicotine dependence, cigarettes, uncomplicated: Secondary | ICD-10-CM | POA: Diagnosis not present

## 2014-12-22 DIAGNOSIS — J441 Chronic obstructive pulmonary disease with (acute) exacerbation: Secondary | ICD-10-CM | POA: Diagnosis not present

## 2014-12-28 IMAGING — CR DG CHEST 1V PORT
1 series · 1 of 1 positions shown · non-contrast
Comparison: none

REASON FOR EXAM: Chest Pain
COMMENTS:

[portable]
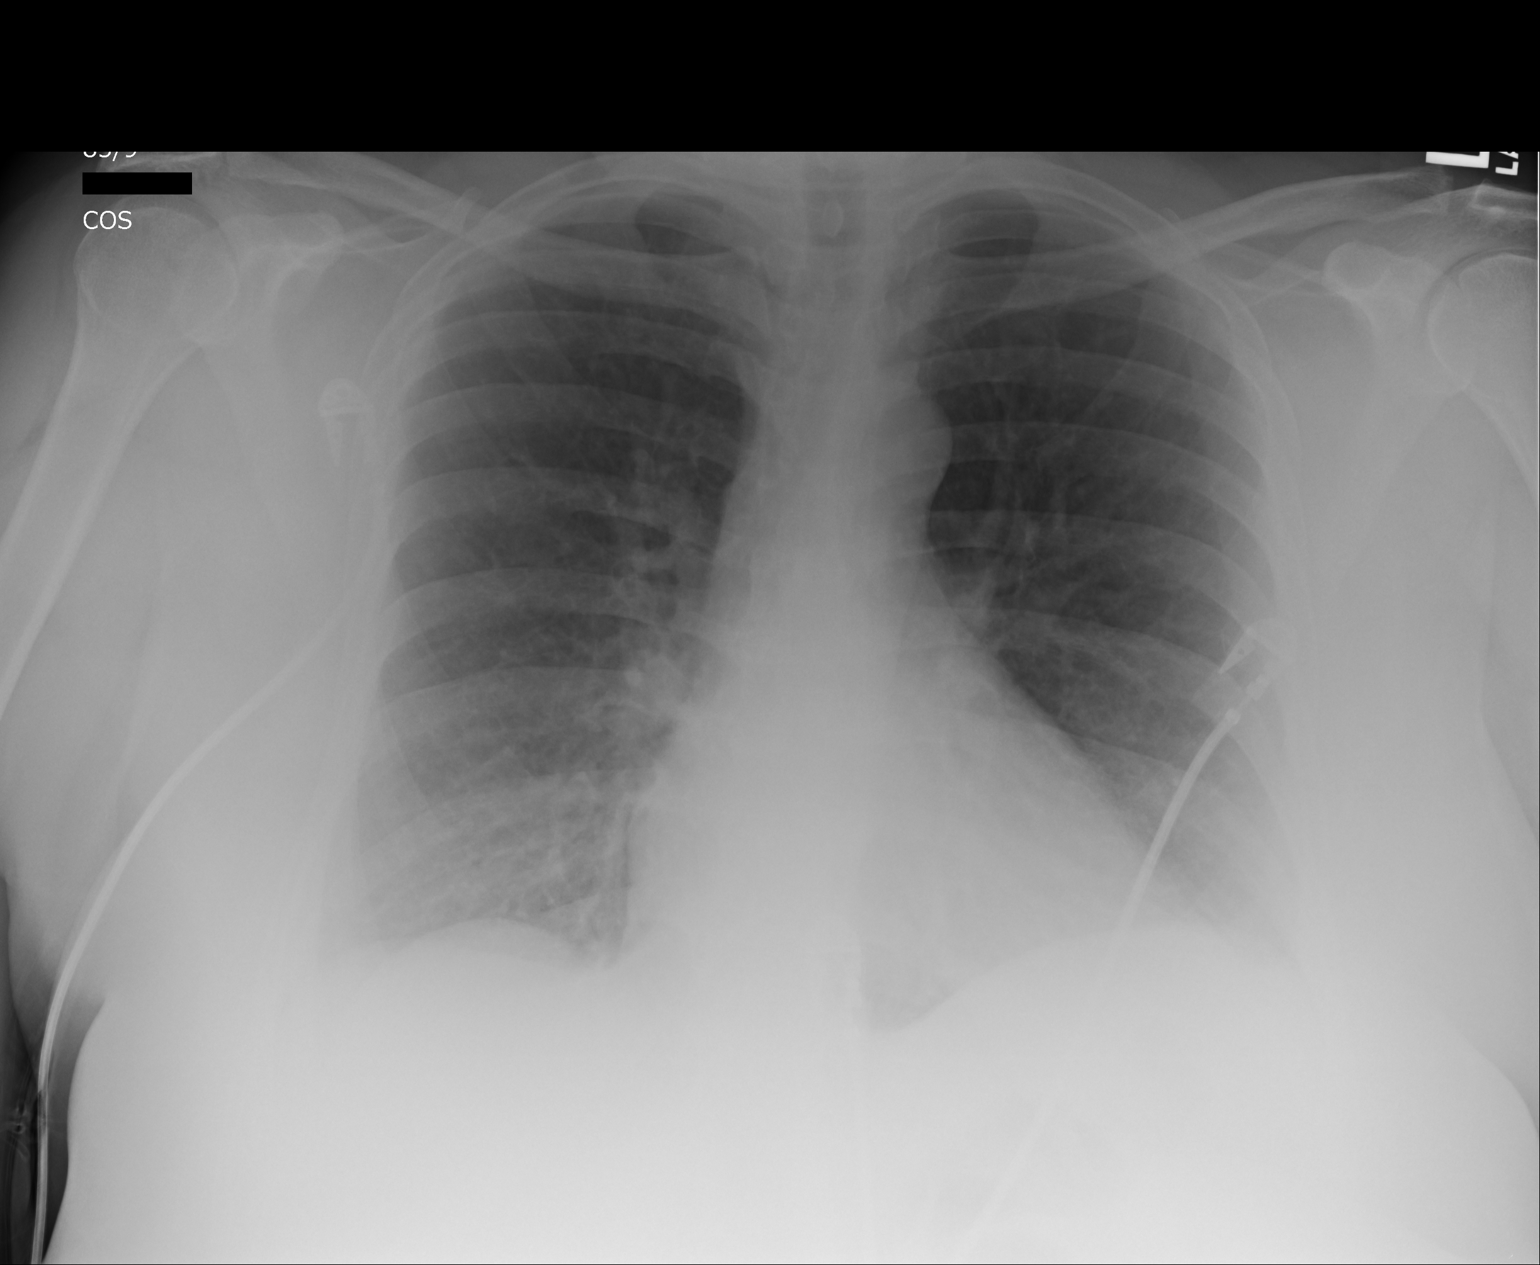

[1 of 1 positions shown; findings below may reference images not displayed]

PROCEDURE:     DXR - DXR PORTABLE CHEST SINGLE VIEW  - August 28, 2013  [DATE]

RESULT:     Comparison is made to a study May 26, 2013.

The lungs are mildly hyperinflated. There is no focal infiltrate. The
cardiac silhouette is top normal in size. The central pulmonary vascularity
is mildly prominent though stable.
IMPRESSION: There is no evidence of pneumonia nor definite evidence of
CHF. There is hyperinflation consistent with COPD. I cannot exclude
superimposed acute bronchitis.

[REDACTED]

## 2015-01-25 IMAGING — CR DG CHEST 2V
1 series · 2 of 2 positions shown · non-contrast
Comparison: none

REASON FOR EXAM: SOB
COMMENTS:

PROCEDURE:     DXR - DXR CHEST PA (OR AP) AND LATERAL  - September 25, 2013  [DATE]
RESULT:     Comparison made to prior study of 08/28/2013. Mediastinum and
hilar structures are normal. Lungs are clear. Heart size normal.

[Series 1: w chest pa · 0.14mm/px · 2 of 2 slices shown]
[im 1/2]
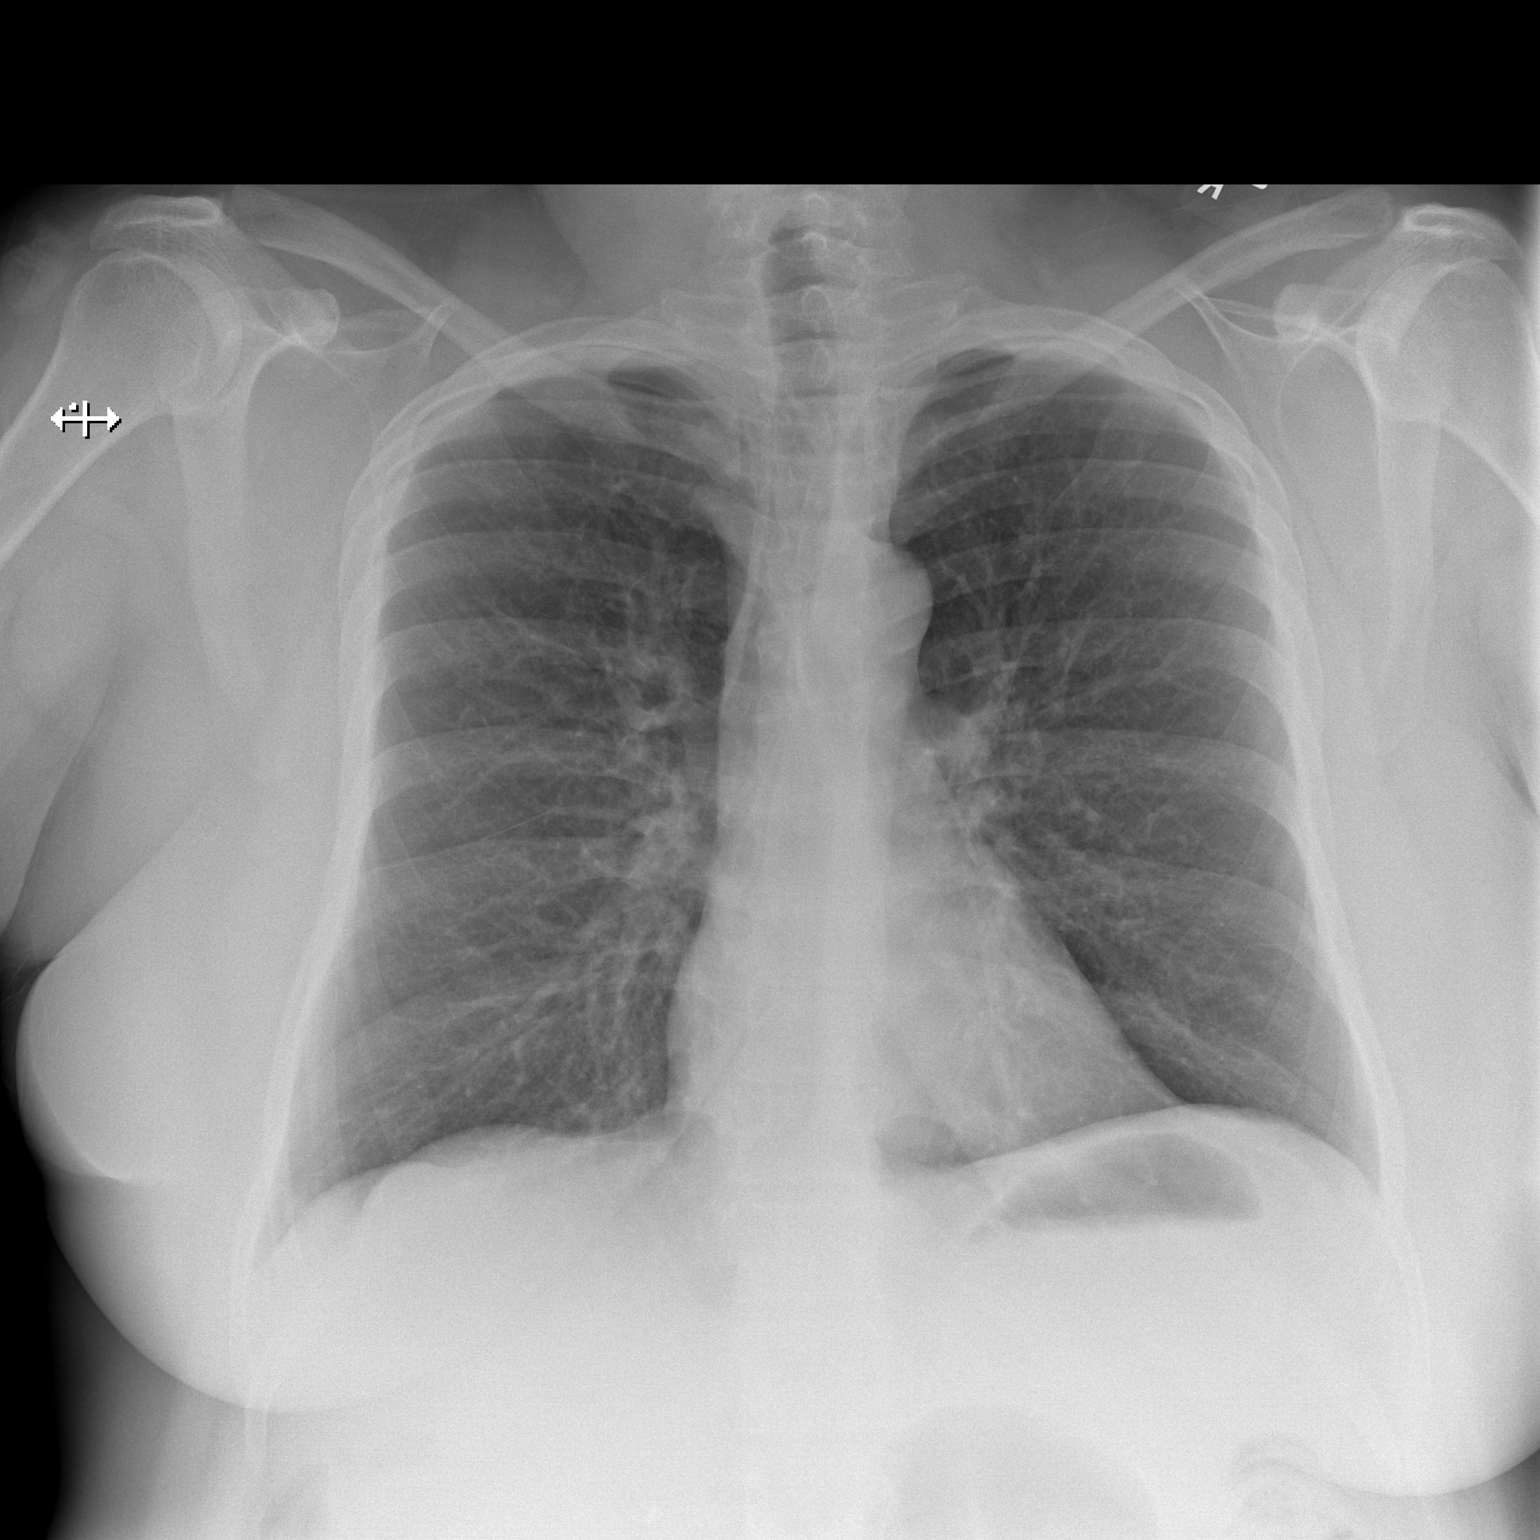
[im 2/2]
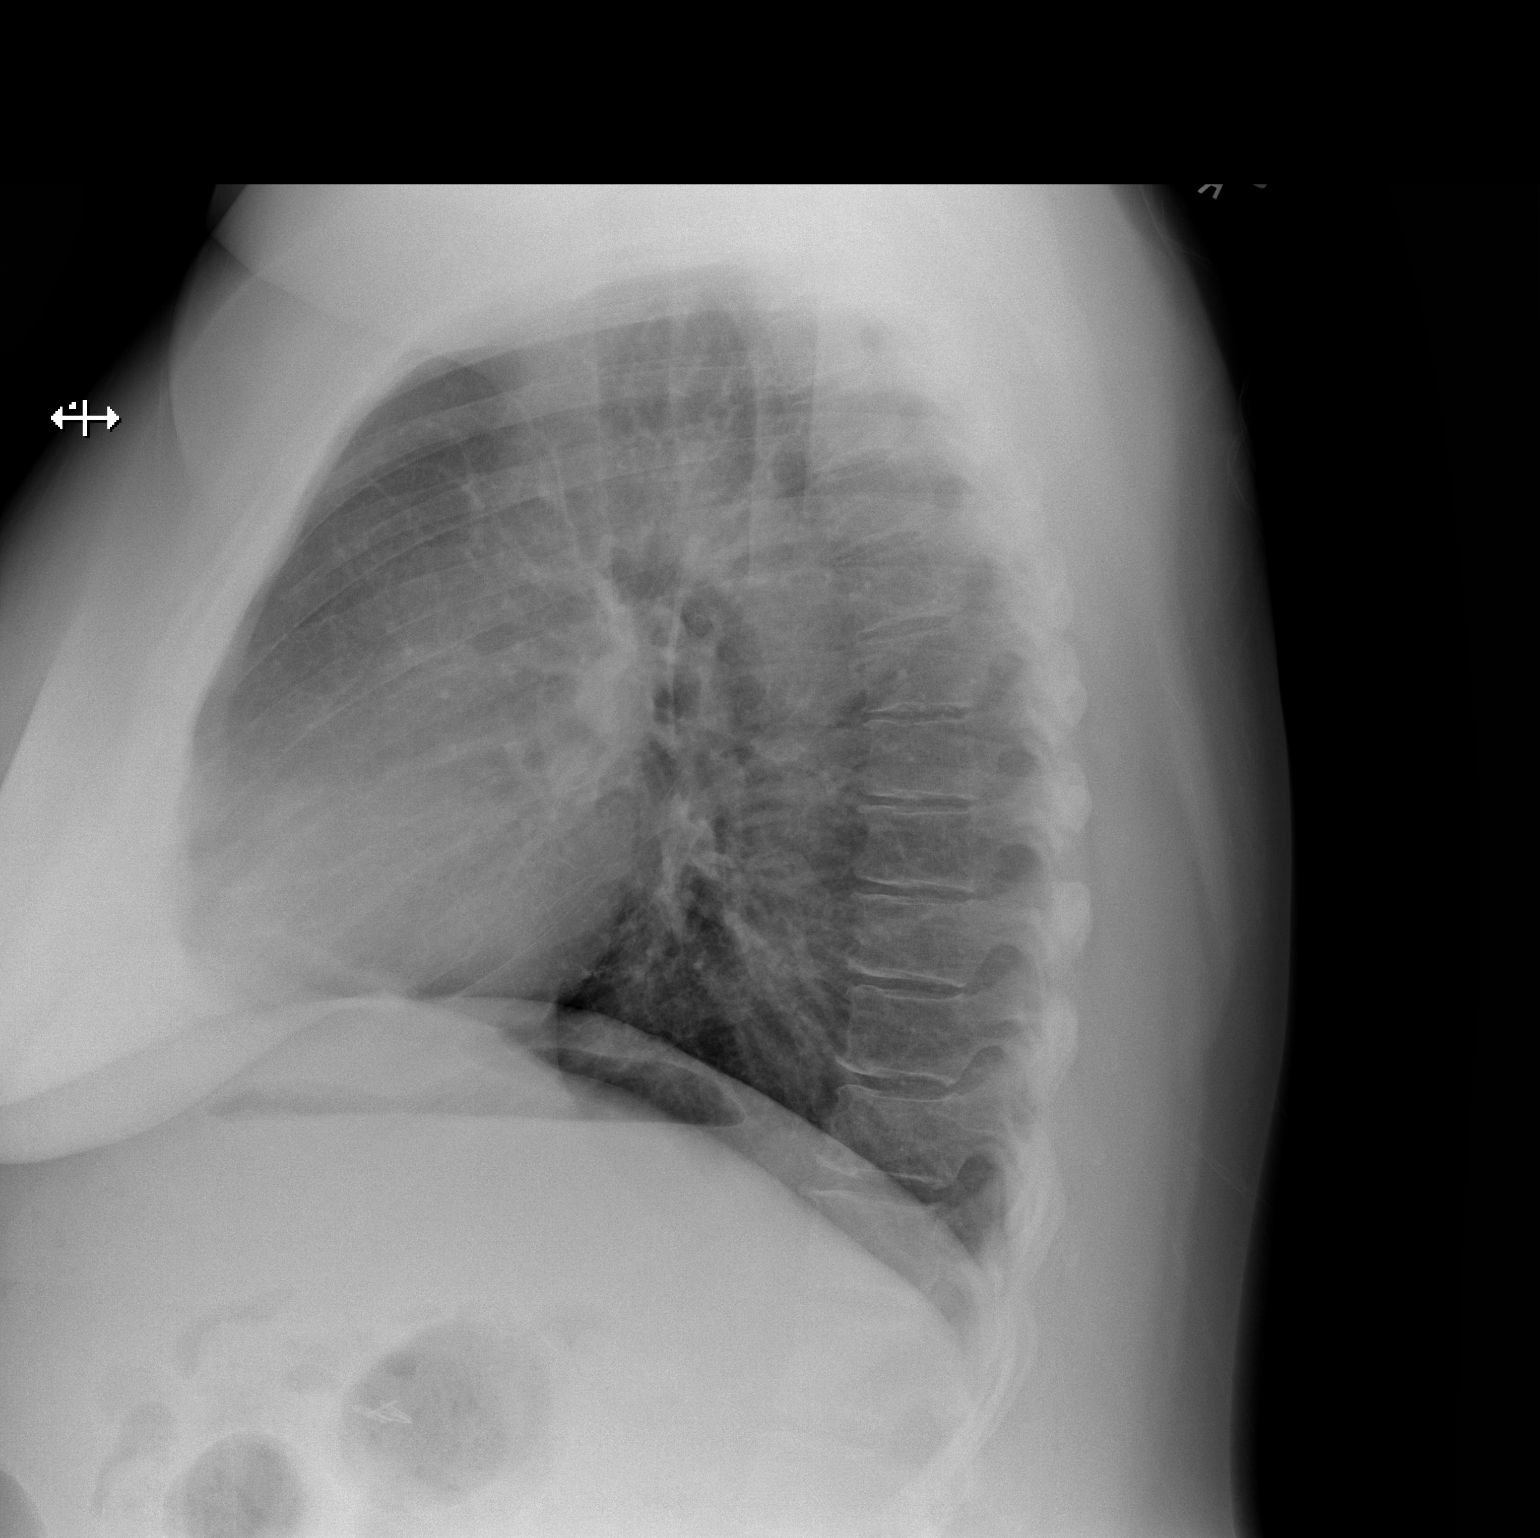

[2 of 2 positions shown; findings below may reference images not displayed]

IMPRESSION: Negative chest.

## 2015-02-21 ENCOUNTER — Emergency Department: Payer: Self-pay | Admitting: Emergency Medicine

## 2015-02-21 DIAGNOSIS — I10 Essential (primary) hypertension: Secondary | ICD-10-CM | POA: Diagnosis not present

## 2015-02-21 DIAGNOSIS — J441 Chronic obstructive pulmonary disease with (acute) exacerbation: Secondary | ICD-10-CM | POA: Diagnosis not present

## 2015-02-21 DIAGNOSIS — F419 Anxiety disorder, unspecified: Secondary | ICD-10-CM | POA: Diagnosis not present

## 2015-02-21 DIAGNOSIS — Z79899 Other long term (current) drug therapy: Secondary | ICD-10-CM | POA: Diagnosis not present

## 2015-02-21 DIAGNOSIS — F17200 Nicotine dependence, unspecified, uncomplicated: Secondary | ICD-10-CM | POA: Diagnosis not present

## 2015-02-21 DIAGNOSIS — Z720 Tobacco use: Secondary | ICD-10-CM | POA: Diagnosis not present

## 2015-02-21 DIAGNOSIS — E119 Type 2 diabetes mellitus without complications: Secondary | ICD-10-CM | POA: Diagnosis not present

## 2015-02-21 DIAGNOSIS — R0602 Shortness of breath: Secondary | ICD-10-CM | POA: Diagnosis not present

## 2015-03-10 ENCOUNTER — Emergency Department: Payer: Self-pay | Admitting: Emergency Medicine

## 2015-03-10 DIAGNOSIS — I10 Essential (primary) hypertension: Secondary | ICD-10-CM | POA: Diagnosis not present

## 2015-03-10 DIAGNOSIS — Z7952 Long term (current) use of systemic steroids: Secondary | ICD-10-CM | POA: Diagnosis not present

## 2015-03-10 DIAGNOSIS — R05 Cough: Secondary | ICD-10-CM | POA: Diagnosis not present

## 2015-03-10 DIAGNOSIS — J45909 Unspecified asthma, uncomplicated: Secondary | ICD-10-CM | POA: Diagnosis not present

## 2015-03-10 DIAGNOSIS — J449 Chronic obstructive pulmonary disease, unspecified: Secondary | ICD-10-CM | POA: Diagnosis not present

## 2015-03-10 DIAGNOSIS — J441 Chronic obstructive pulmonary disease with (acute) exacerbation: Secondary | ICD-10-CM | POA: Diagnosis not present

## 2015-03-10 DIAGNOSIS — F419 Anxiety disorder, unspecified: Secondary | ICD-10-CM | POA: Diagnosis not present

## 2015-03-10 DIAGNOSIS — E119 Type 2 diabetes mellitus without complications: Secondary | ICD-10-CM | POA: Diagnosis not present

## 2015-03-10 DIAGNOSIS — R0602 Shortness of breath: Secondary | ICD-10-CM | POA: Diagnosis not present

## 2015-03-10 DIAGNOSIS — Z79899 Other long term (current) drug therapy: Secondary | ICD-10-CM | POA: Diagnosis not present

## 2015-03-10 DIAGNOSIS — Z720 Tobacco use: Secondary | ICD-10-CM | POA: Diagnosis not present

## 2015-03-10 DIAGNOSIS — Z7951 Long term (current) use of inhaled steroids: Secondary | ICD-10-CM | POA: Diagnosis not present

## 2015-03-10 LAB — BASIC METABOLIC PANEL
ANION GAP: 8 (ref 7–16)
BUN: 16 mg/dL
CALCIUM: 9 mg/dL
CHLORIDE: 107 mmol/L
CO2: 25 mmol/L
Creatinine: 1.42 mg/dL — ABNORMAL HIGH
EGFR (African American): 50 — ABNORMAL LOW
EGFR (Non-African Amer.): 43 — ABNORMAL LOW
Glucose: 146 mg/dL — ABNORMAL HIGH
POTASSIUM: 3.8 mmol/L
Sodium: 140 mmol/L

## 2015-03-10 LAB — CBC
HCT: 45.2 % (ref 35.0–47.0)
HGB: 14.4 g/dL (ref 12.0–16.0)
MCH: 28.3 pg (ref 26.0–34.0)
MCHC: 31.8 g/dL — AB (ref 32.0–36.0)
MCV: 89 fL (ref 80–100)
Platelet: 298 10*3/uL (ref 150–440)
RBC: 5.09 10*6/uL (ref 3.80–5.20)
RDW: 14.1 % (ref 11.5–14.5)
WBC: 9.1 10*3/uL (ref 3.6–11.0)

## 2015-03-10 LAB — TROPONIN I: Troponin-I: <0.03 ng/mL

## 2015-04-03 NOTE — H&P (Signed)
PATIENT NAME:  Samantha Samantha [name], Samantha Samantha [name] MR#:  [id number] DATE OF BIRTH:  1966/10/24  PRIMARY CARE PHYSICIAN: Samantha RisenFozia Khan, MD PRIMARY PULMONOLOGIST: Freda MunroSaadat Khan, MD REFERRING EMERGENCY ROOM PHYSICIAN: Samantha LovelyNoelle McLaurin, MD  CHIEF COMPLAINT: Shortness of breath.   HISTORY OF PRESENT ILLNESS: This is Samantha [name] 49 year old female with medical history of hypertension, diabetes, severe COPD, and ongoing smoker who had multiple episodes in the past few months for COPD exacerbation and steroid and oral antibiotic for 3 to 4 times by primary care physician. Last course finished Samantha [name] few days ago. Started getting short of breath and came to Emergency Room today early morning, but after 1 or 2 treatments, she was discharged home even though she says that she was not feeling good, but ER physician told her to go home as there was good air entry.   The patient continued feeling worse and so came back to the Emergency Room in the evening  again. As per ER physician, 3 treatments of DuoNeb nebulizer were given but did not relieve her shortness of breath, and so she is given admission for COPD exacerbation.   REVIEW OF SYSTEMS:  CONSTITUTIONAL: Negative for fever, fatigue, weakness or weight loss.  EYES: No blurring, double vision, discharge, or redness.  ENT: No tinnitus, ear pain or hearing loss.  RESPIRATORY: No cough but has severe wheezing and shortness of breath.  CARDIOVASCULAR: No chest pain, orthopnea, edema or palpitations.  GASTROINTESTINAL: No nausea, vomiting, diarrhea, abdominal pain.  GENITOURINARY: No dysuria, hematuria or increased frequency.  SKIN: No rashes.  JOINTS: No swelling or tenderness.  NEUROLOGICAL: No weakness, tingling, tremors, or vertigo.  PSYCHIATRIC: No anxiety, insomnia, bipolar disorder.   PAST MEDICAL HISTORY:  1.  Hypertension.  2.  Diabetes.  3.  Tobacco abuse.  4.  Chronic obstructive pulmonary disease.     FAMILY HISTORY: Positive for hypertension.   SOCIAL HISTORY: Smoked 1 pack  of cigarettes for many years and now trying to cut down and says that she is currently smoking 3 to 4 cigarettes every day.   FAMILY HISTORY: Positive for hypertension.   SOCIAL HISTORY: She denies for alcohol or any illicit drug use.   HOME MEDICATIONS:  1.  Symbicort 2 puffs two times Samantha [name] day.  2.  Singulair 10 mg oral tablet once Samantha [name] day.  3.  ProAir HFA 2 puffs inhalation every 4 hours as needed for shortness of breath. Hydrochlorothiazide plus losartan 12.5 mg/50 mg oral tablet once Samantha [name] day.   5.  Flonase 50 mcg inhalation once Samantha [name] day.  6.  Erythromycin 250 mg once Samantha [name] day.  7.  Albuterol 2 puffs inhalation 4 times Samantha [name] day.   PHYSICAL EXAMINATION:  VITAL SIGNS: In ER: Temperature 98.3, pulse 107, respirations 24, and blood pressure 172/77.  GENERAL: Fully alert and oriented to time, place and person. Mild distress due to respiratory distress.  HEENT: Head and neck atraumatic. Conjunctivae pink. Oral mucosa moist.  NECK: Supple. No JVD.  RESPIRATORY: Bilateral equal air entry. Extensive wheezing present. No crepitations.  CARDIOVASCULAR: S1, S2 present. Tachycardia. No murmur.  ABDOMEN: Soft, nontender. Bowel sounds present. No organomegaly.  SKIN: No rashes. Legs: No edema.  NEUROLOGICAL: Power 5/5. Follows commands. Moves all 4 limbs.  PSYCHIATRIC: Does not appear in any acute psychiatric illness.  JOINTS: No swelling or tenderness.   IMPORTANT LABORATORY RESULTS: Glucose 288, BUN 16, creatinine 1.80, sodium 136, potassium 4.5, chloride 106, and CO2 23. Troponin less than 0.02. WBC 8.8, hemoglobin 15.2, platelet count 361.  ASSESSMENT AND PLAN: Samantha [name] 49 year old female with history of chronic obstructive pulmonary disease, hypertension, diabetes, and current smoker who came with chronic obstructive pulmonary disease exacerbation.  1.  Chronic obstructive pulmonary disease exacerbation. Will give IV steroid, nebulizer treatment, and Spiriva plus inhaled steroid and will call pulmonary consult  with Dr. Welton Flakes who is her primary pulmonologist because of repeated episodes.  2.  Hypertension. We will continue home medications.  3.  Diabetes. Continue home medications plus insulin plus sliding scale coverage.  4.  Anxiety. Will give Xanax.  5.  Current smoker. Counseling done for 5 minutes and the patient accepted to have nicotine patch.   CODE STATUS: FULL CODE.   TOTAL TIME SPENT: 50 minutes.   ____________________________ Hope Pigeon Elisabeth Pigeon, MD [id number] D: 09/25/2013 22:12:55 ET T: 09/25/2013 22:36:34 ET JOB#: [id number]  cc: Hope Pigeon. Elisabeth Pigeon, MD, <Dictator> Yevonne Pax, MD Altamese Dilling MD ELECTRONICALLY SIGNED 10/08/2013 0:27

## 2015-04-03 NOTE — Discharge Summary (Signed)
PATIENT NAME:  Samantha [name], Samantha [name] MR#:  [id number] DATE OF BIRTH:  1966-11-01  DATE OF ADMISSION:  09/25/2013 DATE OF DISCHARGE:  09/28/2013  PRESENTING COMPLAINT: Shortness of breath and cough.   DISCHARGE DIAGNOSES: 1. Acute on chronic respiratory failure secondary to chronic obstructive pulmonary disease exacerbation.  2. Acute bronchitis.  3. Hypertension.  4. Neuropathy.  5. Morbid obesity   CONDITION ON DISCHARGE: Fair.   CODE STATUS: FULL CODE.  The patient's sats 91% to 96% on room air.   DISCHARGE MEDICATIONS: 1. Symbicort 160/4.5, 2 puffs b.i.d.  2. Flonase 50 mcg/inhalations daily.  3. Singulair 10 mg daily.  4. Hydrochlorothiazide and losartan 12.5/50 mg 1 tablet daily.  5. Glimepiride 2 mg b.i.d.  6. Albuterol nebs every four hours as needed.  7. Combivent 1 puff b.i.d. 2 to 3 times [name] day.  8. Azithromycin 250 mg p.o. daily.  9. Prednisone taper.  10. Gabapentin 300 mg 3 times [name] day.   The patient advised smoking cessation.   FOLLOWUP: With Dr. Freda MunroSaadat Khan in 1 to 2 weeks.   LABORATORY DATA: BUN is 26, creatinine 1.47, sodium 138, chloride is 105, bicarbonate is 25. A1c is 6.9. White count is 10.6, H and H  is 14.1 and 41.6.   CHEST X-RAY: No acute cardiopulmonary abnormality.   BRIEF SUMMARY OF HOSPITAL COURSE: Ms. Samantha [name] is [name] 49 year old Caucasian female with history of smoking, COPD, and history of hypertension, comes to the Emergency Room with:  1. Acute on chronic obstructive pulmonary disease exacerbation with acute bronchitis. The patient was started on IV Solu-Medrol around the clock along with empiric antibiotics along with around-the-clock nebulizers and inhalers. She improved over the hospital stay. Her sats remained 91% to 96% on room air. She will finish up the antibiotic and prednisone taper.  2. Hypertension. Continue losartan hydrochlorothiazide.  3. Acute renal failure, improved. Her losartan and hydrochlorothiazide were resumed at discharge.  4.  Type 2 diabetes. The patient was kept on sliding scale insulin. Her sugars were somewhat elevated secondary to her IV steroids. She is continued on her home medications.  5. Peripheral neuropathy. Continue Neurontin.  6. Tobacco abuse. The patient counseled on smoking cessation.   Hospital stay otherwise remained stable.   CODE STATUS: The patient remained [name] FULL CODE.   TIME SPENT: 40 minutes. ____________________________ Wylie HailSona [name]. Allena KatzPatel, MD sap:sg D: 09/29/2013 07:42:07 ET T: 09/29/2013 09:05:11 ET JOB#: [id number]  cc: [name]. Allena KatzPatel, MD, <Dictator> Yevonne PaxSaadat [name], MD  [name] Sanuel Ladnier MD ELECTRONICALLY SIGNED 10/06/2013 10:01

## 2015-04-04 NOTE — H&P (Signed)
PATIENT NAME:  Samantha [name], Samantha [name] MR#:  [id number] DATE OF BIRTH:  1966/02/15  DATE OF ADMISSION:  06/28/2014  PRIMARY CARE PHYSICIAN: Dr. Beverely Risen.  PRIMARY PULMONARY: Dr. Adrian Blackwater.  ATTENDING PHYSICIAN:  Dr. Elayne Guerin.   CHIEF COMPLAINT: Shortness of breath.   HISTORY OF PRESENT ILLNESS: This is [name] 49 year old female with known history of hypertension, diabetes, severe COPD on 2 liters nasal cannula at bedtime, with ongoing smoking. The patient  had multiple admissions in the past due to COPD exacerbation, but reports she was never intubated because of that. Had multiple prednisone courses as an outpatient. The patient presents with complaints of shortness of breath, reports has been going on for the last 24 hours. She had multiple nebulizer treatments at home without much relief which prompted her to come to the ED.  The patient was in severe respiratory distress upon presentation, but she did refuse BiPAP. She received multiple nebulizer treatments in ED and IV Solu-Medrol and despite that, she remained to have significant wheezing, reporting cough with productive sputum, green in color, reports an episode of runny nose preceding this flair.  Denies any chest pain, any fever, any chills, any dysuria or polyuria. Hospitalist service requested to admit the patient for further treatment of her COPD exacerbation.   REVIEW OF SYSTEMS: GENERAL: Denies fever, chills, fatigue, weakness, weight loss.  EYES: Denies blurry vision, double vision, inflammation.  EAR, NOSE, THROAT: Denies tinnitus, ear pain, hearing loss.  RESPIRATORY: Reports cough, productive sputum and severe wheezing, and chronic obstructive pulmonary disease.  CARDIOVASCULAR: Denies any chest pain, orthopnea, edema, or palpitation.  GASTROINTESTINAL: Denies nausea, vomiting, diarrhea, abdominal pain.  GENITOURINARY: Denies dysuria and hematuria.  ENDOCRINE:  Denies polyuria, polydipsia, heat or cold intolerance.   HEMATOLOGY: Denies anemia, easy bruising, bleeding diathesis.  INTEGUMENT: Denies acne, rash or skin lesion.  MUSCULOSKELETAL: Denies any gout, arthritis, or cramps.  NEUROLOGIC: Denies any headache, history of seizures, CVA.  No weakness, tingling, tremors or vertigo.  PSYCHIATRIC: Denies anxiety, insomnia, or depression.   PAST MEDICAL HISTORY:  1.  Hypertension.  2.  Diabetes.  3.  Tobacco abuse.  4.  COPD.  5.  Chronic respiratory failure with baseline 2 liters nasal cannula at nighttime.   FAMILY HISTORY: Significant for hypertension.   SOCIAL HISTORY: The patient used to smoke 1 pack of cigarettes for so many years, but currently she cut back to on average 3 cigarettes per day.  No alcohol. No illicit drug use.   HOME MEDICATIONS:  1.  Symbicort 2 puffs b.i.d.  2.  Singulair 10 mg oral daily.  3.  Albuterol as needed.  4.  Hydrochlorothiazide/losartan 12.5/50 mg oral daily.  5.  Flonase daily.  6.  Gabapentin 300 mg oral 3 times [name] day.  7.  Glyburide oral 2 times [name] day.   PHYSICAL EXAMINATION:  VITAL SIGNS: Temperature 98.2, pulse 124, respiratory rate 21, blood pressure 163/102, 89% on 2 liters nasal cannula.  GENERAL: Well-nourished female sitting in bed, in mild respiratory distress.  HEENT: Head is atraumatic, normocephalic. Pupils are equal and reactive to light. Pink conjunctivae. Anicteric sclerae. Moist oral mucosa.  NECK: Supple. No thyromegaly. No JVD  CHEST: Had decreased air entry bilaterally with diffuse wheezing. No rales or rhonchi. Has coarse respiratory sounds.  CARDIOVASCULAR: S1, S2 heard. No rubs, murmurs or gallops. Tachycardic.  ABDOMEN: Obese, soft, nontender, nondistended. Bowel sounds present.  EXTREMITIES: No edema. No clubbing. No cyanosis. Pedal pulses +2 bilaterally.  PSYCHIATRIC: Appropriate affect. Awake  and alert x 3. Intact judgment and insight.  NEUROLOGIC: Cranial nerves grossly intact. Motor 5/5. No focal deficits.  MUSCULOSKELETAL: No  joint effusion or erythema.  SKIN: Normal skin turgor. Warm and dry.   PERTINENT LABORATORY DATA: Glucose 184, BUN 13, creatinine 1.7, sodium 141 potassium 3.8, chloride 108, CO2 22. ALT 57, AST 30, alkaline phosphatase 88. Troponin less than 0.02, CK-MB 3.8, CK total 273. White blood cells 10.2, hemoglobin 14.8, hematocrit 45.8, platelets 328,000, pH 7.36, pCO2 42.   ASSESSMENT AND PLAN:  1.  Chronic obstructive pulmonary disease exacerbation. The patient will be continued on her home medication including Singulair and Symbicort. We will start her on DuoNeb around the clock. We will start her on large dose steroids 125 mg of Solu-Medrol IV every 6 hours. We will start her on levofloxacin as well.  We will keep her on oxygen as needed. The patient currently is using  BiPAP.  2.  Diabetes mellitus. We will start the patient on insulin sliding scale. Will resume her on the glyburide when she is more stable.   3.  Hypertension: Slightly elevated. We will resume her back on home medications.   4.  Chronic kidney disease, appears to be at baseline. We will monitor closely.   5.  Tobacco abuse. The patient was counseled. Will start her on nicotine patch.   6.  Deep vein thrombosis prophylaxis. Subcutaneous heparin.   7.  CODE STATUS: FULL CODE.   TOTAL TIME SPENT ON ADMISSION AND PATIENT CARE: 55 minutes.     ____________________________ Starleen Armsawood S. Elgergawy, MD dse:ts D: 06/28/2014 19:23:43 ET T: 06/28/2014 20:07:23 ET JOB#: [id number]  cc: Starleen Armsawood S. Elgergawy, MD, <Dictator> DAWOOD Teena IraniS ELGERGAWY MD ELECTRONICALLY SIGNED 07/04/2014 1:29

## 2015-04-04 NOTE — Discharge Summary (Signed)
PATIENT NAME:  Samantha [name], [name] MR#:  [id number] DATE OF BIRTH:  [birth year]  DATE OF ADMISSION:  06/28/2014 DATE OF DISCHARGE:  06/30/2014  ADMITTING DIAGNOSIS: Shortness of breath.   DISCHARGE DIAGNOSES: 1.  Acute respiratory failure due to acute chronic obstructive pulmonary disease exacerbation.  2.  Diabetes.  3.  Hypertension.  4.  Chronic kidney disease.  5.  Tobacco abuse.   CONSULTANTS: None.   PERTINENT LABS AND EVALUATIONS: Glucose 184, BUN 13, creatinine 1.70, sodium 141, potassium 3.8, chloride 108, CO2 22, calcium 8.9. LFTs were normal. Troponin less than 0.02. WBC 10.2, hemoglobin 14.8 and platelet count 328.   Chest x-ray showed no acute cardiopulmonary processes.   HOSPITAL COURSE: Please refer to H and P done by the admitting physician. The patient is a 49 year old African American female with history of COPD who reports that she is only on oxygen 2 liters at bedtime. Continues to smoke and has had multiple admissions in the past due to COPD exacerbation. Presents with complaint of shortness of breath and cough, ongoing for 24 hours. The patient was in severe respiratory distress apparently, according to the H and P and refused BiPAP. The patient received multiple breathing treatments and was treated with IV steroids and antibiotics with significant improvement in her symptoms. She continued to be treated for 2 more days during the hospitalization with improvement. Today she is doing much better and is very anxious to go home.   DISCHARGE MEDICATIONS: Flonase 50 mcg 1 spray to each nostril daily, singular 10 daily, hydrochlorothiazide/losartan 12.5/50 one tab p.o. daily, glimepiride 2 mg 1 tab p.o. b.i.d., gabapentin 300 one tab p.o. t.i.d., albuterol nebulizers q. 6 p.r.n., Combivent 1 puff 4 times a day as needed, Symbicort 160/4.5 two puffs b.i.d., prednisone taper starting at 60 mg and taper by 10 until complete, Spiriva 18 mcg daily, Levaquin 250 q. 24 x4 days.  DISCHARGE  OXYGEN: 2 liters nasal cannula.   DISCHARGE DIET: Low-sodium, low-fat, low-cholesterol.   DISCHARGE ACTIVITY: As tolerated.   DISCHARGE FOLLOWUP: Follow up with primary MD in 1 to 2 weeks.  TIME SPENT: 35 minutes.  ____________________________ Lacie ScottsShreyang H. Allena KatzPatel, MD shp:sb D: 07/01/2014 08:14:52 ET T: 07/01/2014 10:33:22 ET JOB#: [id number]  cc: Mccall Lomax H. Allena KatzPatel, MD, <Dictator> Charise CarwinSHREYANG H Add Dinapoli MD ELECTRONICALLY SIGNED 07/02/2014 12:32

## 2015-04-05 NOTE — H&P (Signed)
PATIENT NAME:  Samantha [name], Samantha [name] MR#:  [id number] DATE OF BIRTH:  09/07/1966  DATE OF ADMISSION:  05/07/2012  PRIMARY CARE PHYSICIAN: Dr. Beverely RisenFozia Khan  PULMONOLOGIST: Dr. Freda MunroSaadat Khan REQUESTING PHYSICIAN: Dr. Marilynne HalstedBoland   CHIEF COMPLAINT: Shortness of breath.  HISTORY OF PRESENT ILLNESS: Patient is [name] 49 year old female with [name] known history of chronic obstructive pulmonary disease, hypertension, diabetes is being admitted for chronic obstructive pulmonary disease exacerbation. Patient was here in the Emergency Department last Thursday. She has been having trouble breathing since then, was given nebulizer and steroid and was requested to come into the hospital but she decided to go home and refused admission. She continued to get worse, was unable to breathe, kept having dry cough and headache and decided to come back to the Emergency Department today and she is being admitted for further evaluation and management.   PAST MEDICAL HISTORY: 1. Hypertension. 2. Diabetes. 3. Tobacco abuse. 4. Asthma.  5. Chronic obstructive pulmonary disease.   ALLERGIES: Percocet.   FAMILY HISTORY: Positive for hypertension.  SOCIAL HISTORY: Smoked one pack of cigarettes daily for last so many years. Did not smoke for last two days. Occasional alcohol. Denies any illicit drug use. She is on disability. She is divorced, has children.   MEDICATIONS AT HOME:  1. Albuterol/ipratropium 2 puffs inhaled q.i.d. as needed. 2. Flonase 1 spray daily. 3. Glimepiride 2 mg p.o. b.i.d.  4. Hydrochlorothiazide/losartan 12.5/50, 1 tablet p.o. daily. 5. Proventil 2 puffs inhaled q.i.d. as needed. 6. Singulair once daily. 7. Symbicort 2 puffs inhaled b.i.d.   REVIEW OF SYSTEMS: CONSTITUTIONAL: No fever, fatigue, weakness. EYES: No blurred or double vision. ENT: No tinnitus or ear pain. RESPIRATORY: Positive for dry cough, wheezing and shortness of breath. CARDIOVASCULAR: No chest pain, orthopnea, edema. GASTROINTESTINAL: No  nausea, vomiting, diarrhea. GENITOURINARY: No dysuria, hematuria. ENDOCRINE: No polyuria, nocturia. HEMATOLOGY: No anemia, easy bruising. SKIN: No rash or lesions. MUSCULOSKELETAL: No arthritis or muscle cramps. NEUROLOGICAL: No tingling, numbness, weakness. PSYCH: No history of anxiety, depression.   PHYSICAL EXAMINATION: VITAL SIGNS: Temperature 98, heart rate 93 per minute, respirations 21 per minute, blood pressure 137/88 mmHg. She is saturating 96% on 2 liters oxygen by nasal cannula.  GENERAL: Patient is [name] 49 year old female laying in the bed in mild respiratory distress.  EYES: Pupils equal, round, reactive to light, accommodation. No scleral icterus. Extraocular movements are intact.  HENT: Head atraumatic, normocephalic. Oropharynx and nasopharynx clear.   NECK: Supple. No jugular venous distention. No thyroid enlargement or tenderness.  LUNGS: Decreased breath sounds at the bases. No accessory muscle of respirations use. Extensive wheezing throughout both lungs. No crackles or rales.  CARDIOVASCULAR: S1, S2 normal. No murmur, rales, gallops.  ABDOMEN: Soft, obese, nontender, nondistended. Bowel sounds present. No organomegaly or mass.  EXTREMITIES: No pedal edema, cyanosis, clubbing.   NEUROLOGICAL: Nonfocal examination. Cranial nerves III through XII intact. Muscle strength 5/5 in extremities. Sensation intact.  PSYCH: Patient is oriented to time, place and person x3.   SKIN: No obvious rash, lesion, ulcer.   LABORATORY, DIAGNOSTIC AND RADIOLOGICAL DATA: Normal BMP except creatinine 1.32, glucose 105. Normal liver function tests. Normal first set of cardiac enzymes. Normal CBC.   Chest x-ray while in the ED shows no acute cardiopulmonary disease.   EKG shows normal sinus rhythm. No ST-T changes.   IMPRESSION AND PLAN: 1. Chronic obstructive pulmonary disease exacerbation. Will start her on IV antibiotics, nebulizer breathing treatment, steroids and monitor her on off unit  telemetry to measure for  any arrhythmias. This can be discontinued after 24 hours if she does not have any more arrhythmias. Will obtain sputum culture if able.  2. Diabetes mellitus. Will start her on sliding scale insulin. She will continue glimepiride.  3. Hypertension. Will continue home medication. Watch renal function closely while on hydrochlorothiazide and losartan.  4. Tobacco abuse. She was counseled for about three minutes. She was agreeable to use nicotine patch while in the hospital as she feels she will need it. Will provide smoking cessation material also.   TOTAL TIME TAKING CARE OF THIS PATIENT: 45 minutes.   ____________________________ Samantha [name]. Sherryll Burger, MD vss:cms D: 05/07/2012 17:32:00 ET T: 05/08/2012 07:09:40 ET JOB#: [id number]  cc: Samantha Elizardo S. Sherryll Burger, MD, <Dictator> Samantha Code, MD Samantha Pax, MD Samantha [name] [facility] MD ELECTRONICALLY SIGNED 05/08/2012 13:44

## 2015-04-05 NOTE — Discharge Summary (Signed)
PATIENT NAME:  Samantha [name], Samantha [name] MR#:  [id number] DATE OF BIRTH:  Aug 25, 1966  DATE OF ADMISSION:  05/07/2012 DATE OF DISCHARGE:  05/09/2012  ADMITTING PHYSICIAN: Delfino LovettVipul Shah, MD   DISCHARGING PHYSICIAN: Enid Baasadhika Deshawna Mcneece, MD    PRIMARY CARE PHYSICIAN: Beverely RisenFozia Khan, MD   PRIMARY PULMONOLOGIST: Freda MunroSaadat Khan, MD   CONSULTATIONS IN THE HOSPITAL: None.   DISCHARGE DIAGNOSES:  1. Acute chronic obstructive pulmonary disease exacerbation.  2. Tobacco use disorder.  3. Hypertension.  4. Non-insulin-dependent diabetes mellitus.  5. Obesity.   DISCHARGE MEDICATIONS:  1. Proventil inhaler 2 puffs q.6 hours p.r.n. for wheezing.  2. Symbicort 160/4.5 two puffs b.i.d.  3. Flonase 50 mcg nasal spray one spray once [name] day both nares.   4. Singulair 10 mg p.o. daily.  5. Losartan/HCTZ 50 mg/12.5 mg p.o. daily.  6. Glimepiride 2 mg p.o. b.i.d.  7. Prednisone taper.  8. Levaquin 500 mg p.o. daily for three more days.   DISCHARGE DIET: Low sodium, ADA diet.   DISCHARGE ACTIVITY: As tolerated.    FOLLOW-UP INSTRUCTIONS:  1. PCP follow-up in 1 to 2 weeks.  2. Smoking cessation advised.   LABS AND IMAGING STUDIES: Sodium 140, potassium 4.1, chloride 104, bicarb 26, BUN 14, creatinine 1.4, glucose 188, calcium 9.2, WBC 14.3, hemoglobin 14.2, hematocrit 44.0, platelet count 373. Sats on ambulation were 95%. Urinalysis with 3+ blood, several RBCs, no bacteria. Blood cultures one set came back positive for coagulase-negative Staph, likely contamination.  Chest x-ray showing clear lung fields. No evidence of pneumonia, pneumothorax, or effusion.  BRIEF HOSPITAL COURSE: Samantha [name] is [name] 49 year old obese African American female with past medical history of ongoing smoking with COPD, hypertension, and diabetes who presented to the Emergency Room with difficulty breathing. She was in the ER 1 to 2 days prior with the same problems but refused admission at the time. Her symptoms became worse so she came back.   1. Acute COPD exacerbation probably with ongoing smoking. She was treated with IV Solu-Medrol, DuoNebs, and is being changed over to inhalers with Symbicort, Proventil p.r.n, and also prednisone taper. She had bronchitis symptoms with productive cough so was started on Levaquin with improvement in symptoms so she will be discharged on p.o. Levaquin for [name] total of five days. She was strongly advised against smoking. She was on Nicotrol inhaler while in the hospital.  2. Hypertension. She will go back to her home dose of losartan/HCTZ.  3. Diabetes mellitus. She is on glimepiride p.o. b.i.d. which she will continue on.  Her course has been otherwise uneventful in the hospital. She was initially requiring 2 liters of oxygen and her sats improved. She is currently ambulating well on room air with good sats. She is being discharged without any home oxygen. Care Management was requested to help with her home medications.   DISCHARGE CONDITION: Stable.   DISCHARGE DISPOSITION: Home.       TIME SPENT ON DISCHARGE: 40 minutes.   ____________________________ Enid Baasadhika Prestin Munch, MD rk:drc D: 05/09/2012 13:30:04 ET T: 05/09/2012 14:12:09 ET JOB#: [id number]  cc: Enid Baasadhika Dakin Madani, MD, <Dictator> Lyndon CodeFozia M. Khan, MD Enid BaasADHIKA [place] Sigmon MD ELECTRONICALLY SIGNED 05/14/2012 10:13

## 2015-04-16 DIAGNOSIS — J301 Allergic rhinitis due to pollen: Secondary | ICD-10-CM | POA: Diagnosis not present

## 2015-04-16 DIAGNOSIS — F17210 Nicotine dependence, cigarettes, uncomplicated: Secondary | ICD-10-CM | POA: Diagnosis not present

## 2015-04-16 DIAGNOSIS — E1165 Type 2 diabetes mellitus with hyperglycemia: Secondary | ICD-10-CM | POA: Diagnosis not present

## 2015-04-16 DIAGNOSIS — J441 Chronic obstructive pulmonary disease with (acute) exacerbation: Secondary | ICD-10-CM | POA: Diagnosis not present

## 2015-05-18 IMAGING — CR DG CHEST 2V
1 series · 2 of 2 positions shown · non-contrast
Comparison: DG CHEST 2V dated 09/25/2013

CLINICAL DATA: Dyspnea, history of asthma and COPD

EXAM:
CHEST  2 VIEW

[Series 1: w chest pa · 0.14mm/px · 2 of 2 slices shown]
[im 1/2]
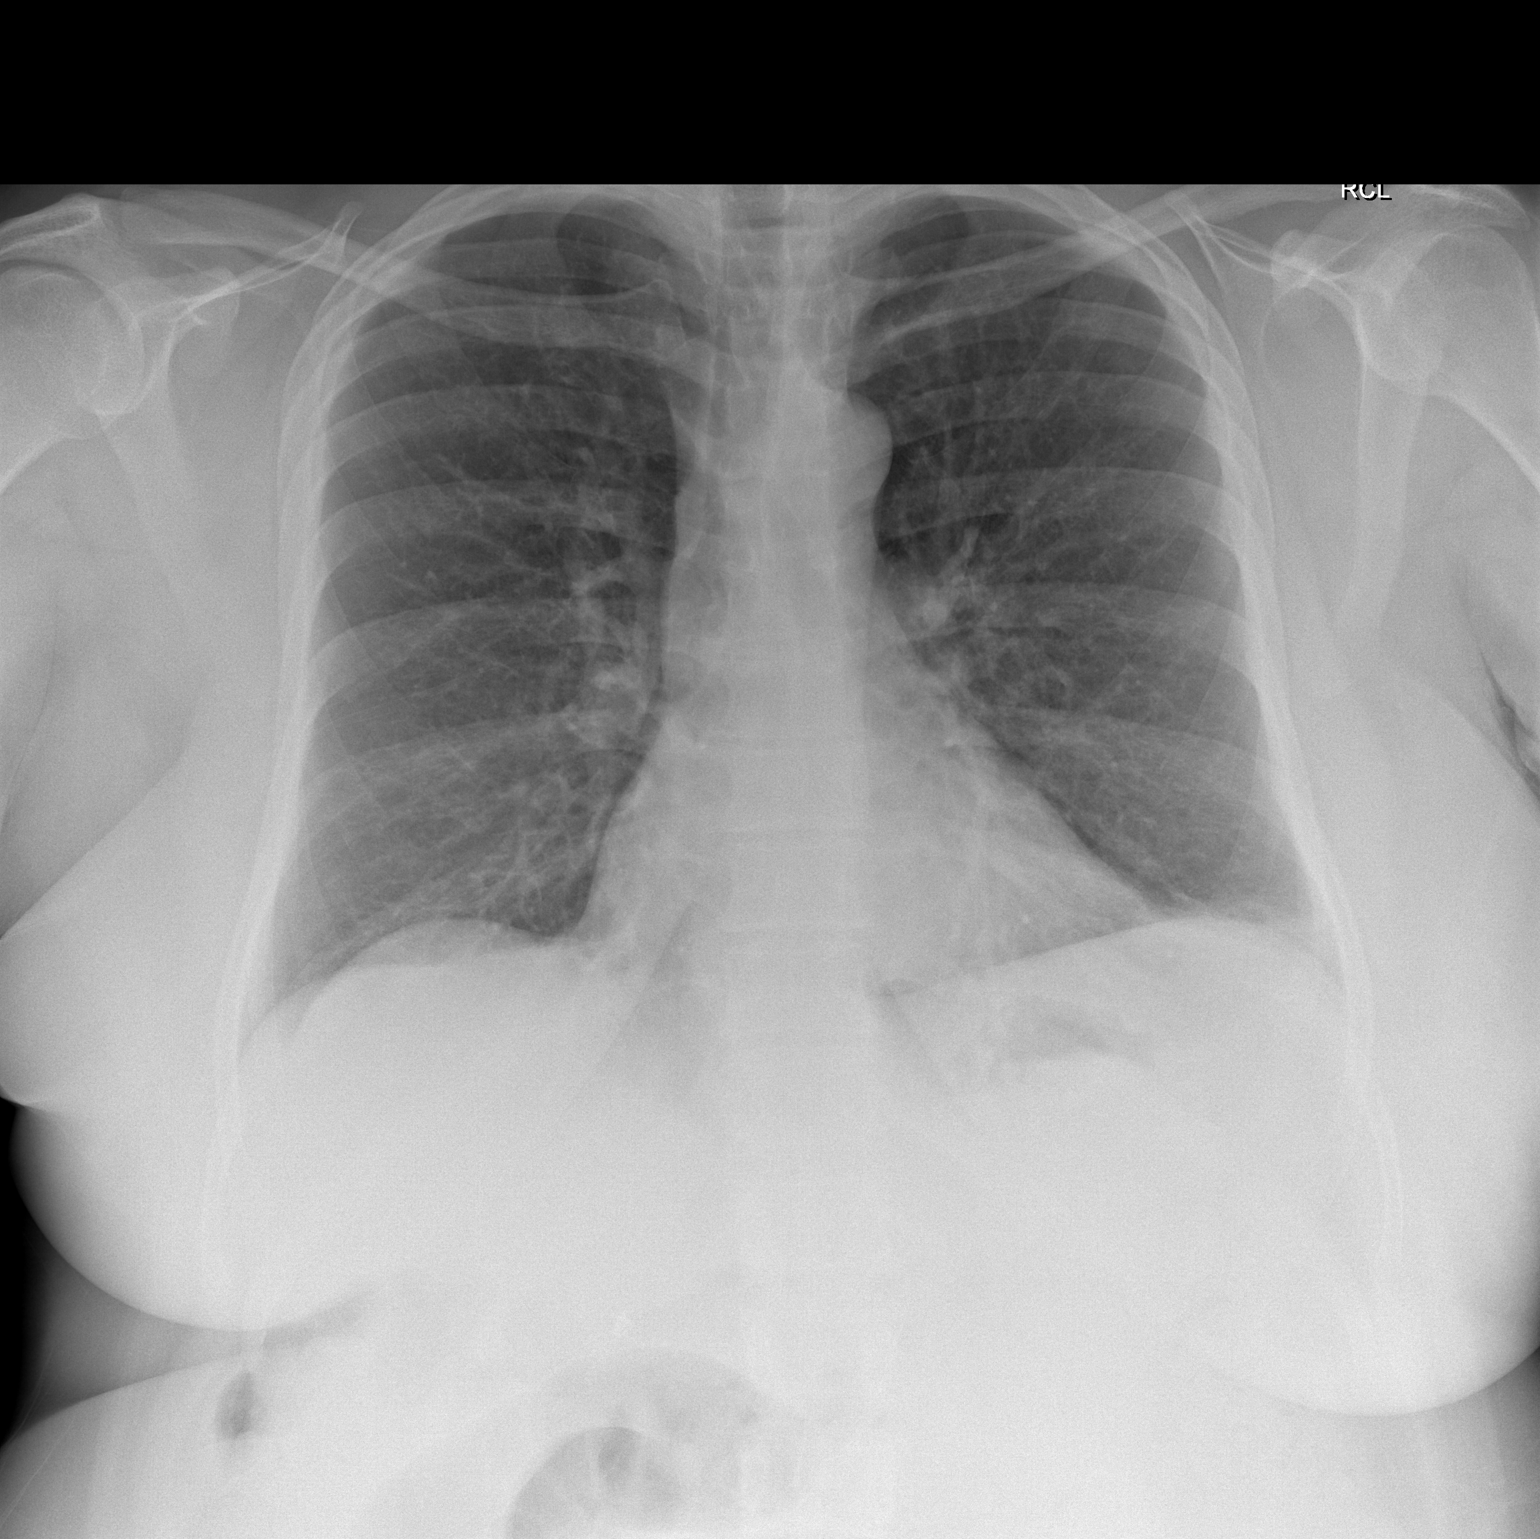
[im 2/2]
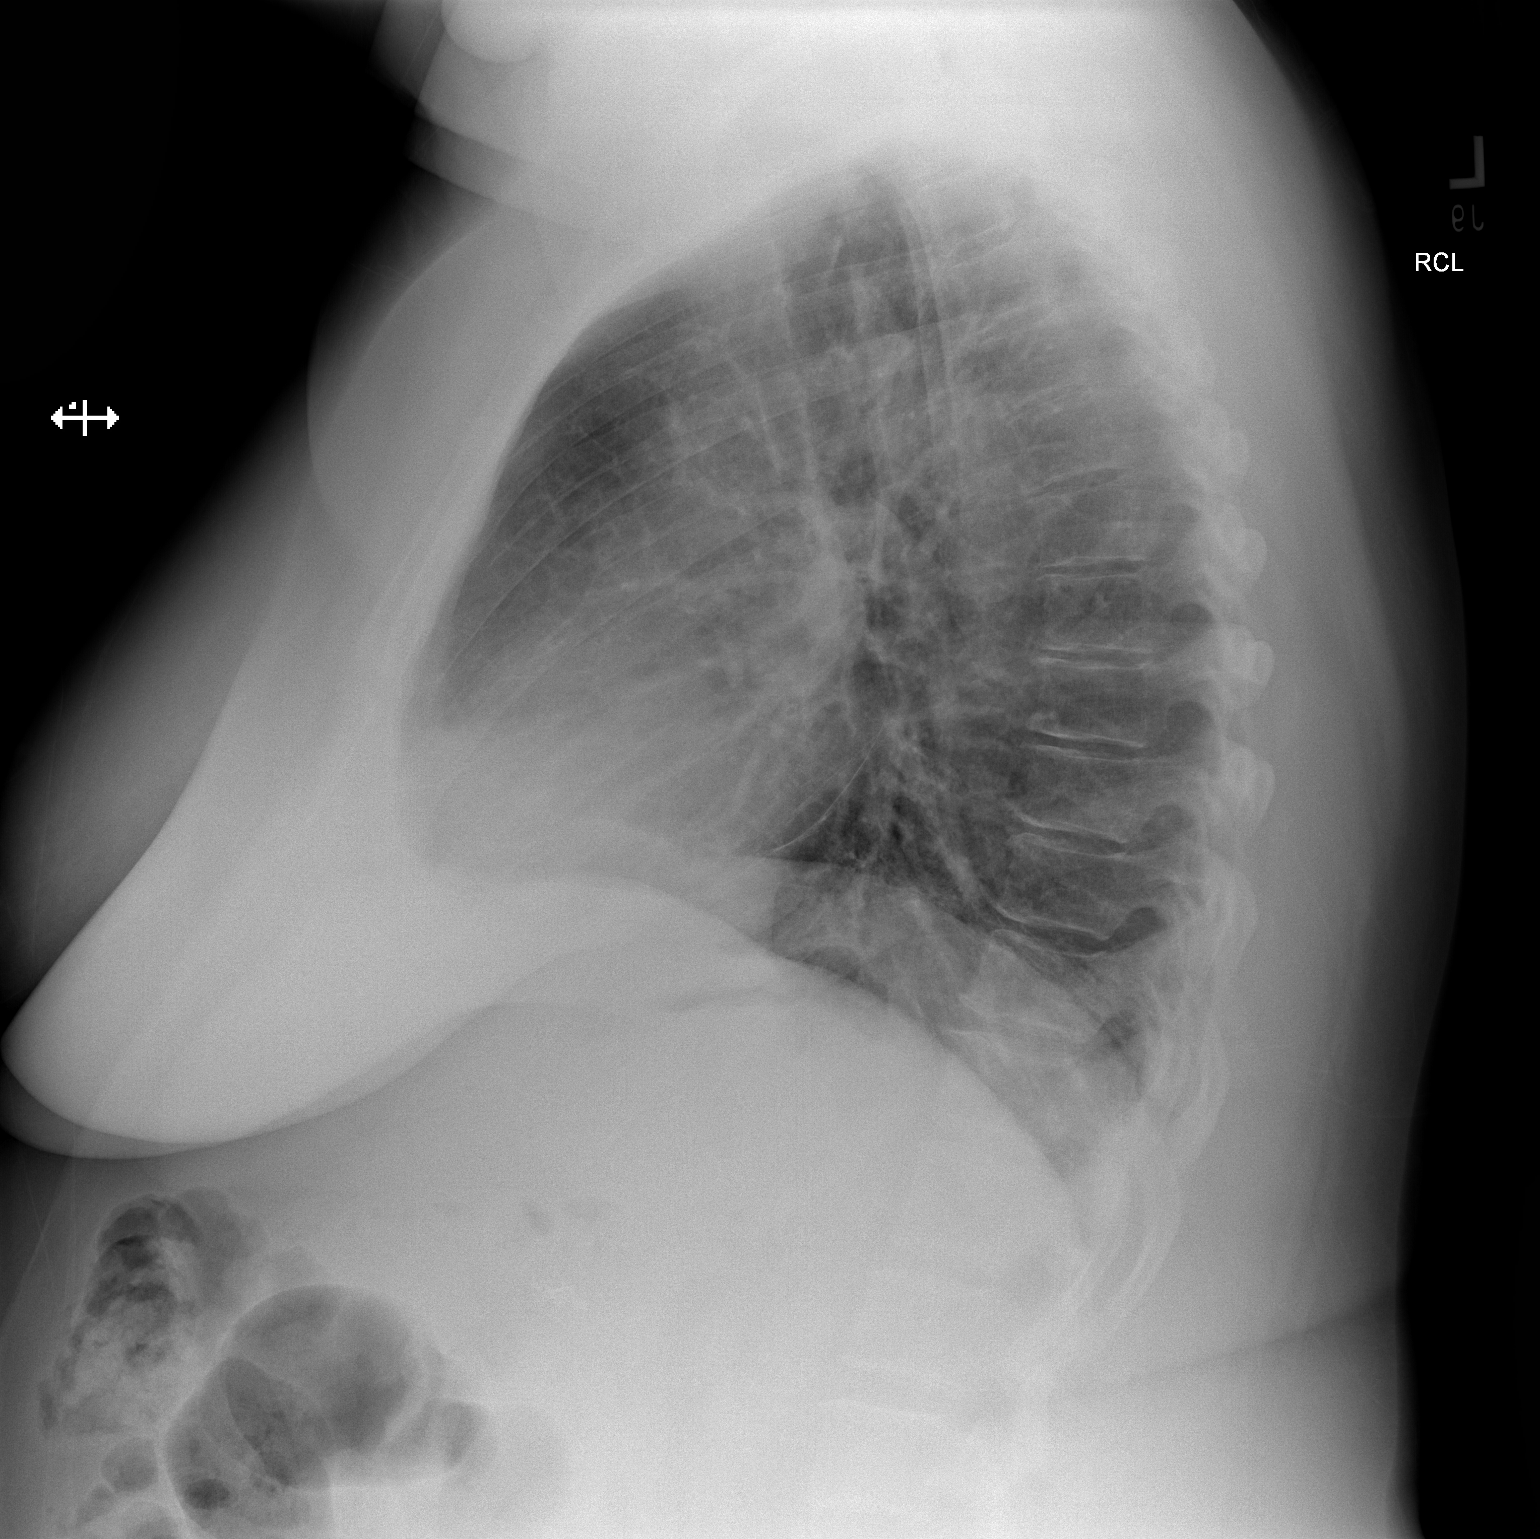

[2 of 2 positions shown; findings below may reference images not displayed]

FINDINGS: The lungs are adequately inflated. There is hazy increased density
anterolaterally in the left lower lobe. The cardiopericardial
silhouette is normal in size. The pulmonary vascularity is not
engorged. The mediastinum is normal in width. There is no pleural
effusion. The observed portions of the bony thorax appear normal.
IMPRESSION: There is atelectasis at the left lung base anteriorly and laterally.
There is no alveolar pneumonia. Follow-up films following therapy
are recommended to assure clearing.

## 2015-06-02 ENCOUNTER — Encounter: Payer: Self-pay | Admitting: Emergency Medicine

## 2015-06-02 ENCOUNTER — Emergency Department: Payer: Medicare Other

## 2015-06-02 ENCOUNTER — Other Ambulatory Visit: Payer: Self-pay

## 2015-06-02 ENCOUNTER — Emergency Department
Admission: EM | Admit: 2015-06-02 | Discharge: 2015-06-02 | Disposition: A | Discharge status: Home / Self Care | Payer: Medicare Other | Attending: Emergency Medicine | Admitting: Emergency Medicine

## 2015-06-02 DIAGNOSIS — Z87891 Personal history of nicotine dependence: Secondary | ICD-10-CM | POA: Diagnosis not present

## 2015-06-02 DIAGNOSIS — J45901 Unspecified asthma with (acute) exacerbation: Secondary | ICD-10-CM | POA: Diagnosis not present

## 2015-06-02 DIAGNOSIS — R0600 Dyspnea, unspecified: Secondary | ICD-10-CM

## 2015-06-02 DIAGNOSIS — R0602 Shortness of breath: Secondary | ICD-10-CM | POA: Diagnosis present

## 2015-06-02 HISTORY — DX: Unspecified asthma, uncomplicated: J45.909

## 2015-06-02 MED ORDER — IPRATROPIUM-ALBUTEROL 0.5-2.5 (3) MG/3ML IN SOLN
3.0000 mL | Freq: Once | RESPIRATORY_TRACT | Status: AC
  Administered 2015-06-02: 15:00:00 via RESPIRATORY_TRACT

## 2015-06-02 MED ORDER — METHYLPREDNISOLONE SODIUM SUCC 125 MG IJ SOLR
125.0000 mg | Freq: Once | INTRAMUSCULAR | Status: AC
  Administered 2015-06-02: 125 mg via INTRAVENOUS
  Filled 2015-06-02: qty 2

## 2015-06-02 MED ORDER — IPRATROPIUM-ALBUTEROL 0.5-2.5 (3) MG/3ML IN SOLN
3.0000 mL | Freq: Once | RESPIRATORY_TRACT | Status: AC
  Administered 2015-06-02: 3 mL via RESPIRATORY_TRACT

## 2015-06-02 MED ORDER — IPRATROPIUM-ALBUTEROL 0.5-2.5 (3) MG/3ML IN SOLN
RESPIRATORY_TRACT | Status: AC
  Filled 2015-06-02: qty 9

## 2015-06-02 MED ORDER — PREDNISONE 20 MG PO TABS: 40.0000 mg | ORAL_TABLET | Freq: Every day | ORAL | Status: DC

## 2015-06-02 MED ORDER — AZITHROMYCIN 250 MG PO TABS: ORAL_TABLET | ORAL | Status: AC

## 2015-06-02 MED ORDER — METHYLPREDNISOLONE SODIUM SUCC 125 MG IJ SOLR
INTRAMUSCULAR | Status: AC
  Administered 2015-06-02: 125 mg via INTRAVENOUS
  Filled 2015-06-02: qty 2

## 2015-06-02 NOTE — ED Provider Notes (Signed)
[facility] Emergency Department Provider Note  Time seen: 3:57 PM  I have reviewed the triage vital signs and the nursing notes.   HISTORY  Chief Complaint Asthma    HPI Samantha [name] is a 49 y.o. female with a past medical history of asthma and COPD, who wears 2 L of oxygen at night presents the emergency department for worsening shortness of breath. According to the patient for the past 2 weeks she has had cough and congestion which she states is a "cold." For the past several days she has had worsening shortness of breath, and now with audible wheeze so she came to the emergency department. The patient states she wears oxygen at home, she takes nebulizer treatments during the daytime. The patient does continue to smoke approximately half a pack of cigarettes per day. Denies any chest pain. States her shortness of breath is moderate, worse with exertion. Denies fever.     Past Medical History  Diagnosis Date  . Asthma     There are no active problems to display for this patient.   History reviewed. No pertinent past surgical history.  No current outpatient prescriptions on file.  Allergies Review of patient's allergies indicates not on file.  History reviewed. No pertinent family history.  Social History History  Substance Use Topics  . Smoking status: Former Games developer  . Smokeless tobacco: Not on file  . Alcohol Use: No    Review of Systems Constitutional: Negative for fever. ENT: Positive for congestion. Cardiovascular: Negative for chest pain. Respiratory: Positive for shortness of breath. Gastrointestinal: Negative for abdominal pain, vomiting and diarrhea. Neurological: Negative for headache 10-point ROS otherwise negative.  ____________________________________________   PHYSICAL EXAM:  VITAL SIGNS: ED Triage Vitals  Enc Vitals Group     BP 06/02/15 1501 155/97 mmHg     Pulse Rate 06/02/15 1501 110     Resp 06/02/15 1501  30     Temp 06/02/15 1501 98 F (36.7 C)     Temp Source 06/02/15 1501 Oral     SpO2 06/02/15 1501 90 %     Weight 06/02/15 1457 270 lb (122.471 kg)     Height 06/02/15 1457  (1.702 m)     Head Cir --      Peak Flow --      Pain Score --      Pain Loc --      Pain Edu? --      Excl. in GC? --     Constitutional: Alert and oriented. Well appearing  ENT   Mouth/Throat: Mucous membranes are moist. Cardiovascular: Regular rhythm around 115 bpm, status post nebulizer treatments, no murmur auscultated. Respiratory: Normal respiratory effort, no retractions noted. Mild expiratory wheeze bilateral, status post nebulizer treatments. Gastrointestinal: Soft and nontender. No distention.  Musculoskeletal: Nontender with normal range of motion in all extremities. Neurologic:  Normal speech and language. No gross focal neurologic deficits  Skin:  Skin is warm, dry and intact.  Psychiatric: Mood and affect are normal. Speech and behavior are normal. ____________________________________________    EKG  EKG reviewed and interpreted by myself shows normal sinus rhythm at 99 bpm, narrow QRS, normal axis, normal intervals, no ST changes noted.  ____________________________________________    RADIOLOGY  Chest x-ray within normal limits  ____________________________________________   INITIAL IMPRESSION / ASSESSMENT AND PLAN / ED COURSE  Pertinent labs & imaging results that were available during my care of the patient were reviewed by me and considered  in my medical decision making (see chart for details).  Patient presents for 3 days of worsening shortness of breath, and 2 weeks of cough/cold symptoms. Patient has a history of asthma and COPD, audible wheeze on arrival. Patient states she is feeling much better after DuoNeb's, Solu-Medrol. We will monitor in the emergency department, the patient is able to maintain her oxygen saturation we'll discharge home on Zithromax, and  prednisone. Patient agreeable to plan.  ____________________________________________   FINAL CLINICAL IMPRESSION(S) / ED DIAGNOSES  Asthma exacerbation COPD exacerbation   Minna Antis, MD 06/02/15 1623

## 2015-06-02 NOTE — ED Notes (Signed)
Increased diff breathing with wheezing

## 2015-06-02 NOTE — Discharge Instructions (Signed)

## 2015-06-16 ENCOUNTER — Other Ambulatory Visit: Payer: Self-pay

## 2015-06-16 ENCOUNTER — Emergency Department: Payer: Medicare Other

## 2015-06-16 ENCOUNTER — Emergency Department
Admission: EM | Admit: 2015-06-16 | Discharge: 2015-06-16 | Disposition: A | Discharge status: Home / Self Care | Payer: Medicare Other | Attending: Emergency Medicine | Admitting: Emergency Medicine

## 2015-06-16 ENCOUNTER — Encounter: Payer: Self-pay | Admitting: Emergency Medicine

## 2015-06-16 DIAGNOSIS — J45901 Unspecified asthma with (acute) exacerbation: Secondary | ICD-10-CM

## 2015-06-16 DIAGNOSIS — Z79899 Other long term (current) drug therapy: Secondary | ICD-10-CM | POA: Diagnosis not present

## 2015-06-16 DIAGNOSIS — E119 Type 2 diabetes mellitus without complications: Secondary | ICD-10-CM | POA: Insufficient documentation

## 2015-06-16 DIAGNOSIS — J441 Chronic obstructive pulmonary disease with (acute) exacerbation: Secondary | ICD-10-CM | POA: Insufficient documentation

## 2015-06-16 DIAGNOSIS — Z87891 Personal history of nicotine dependence: Secondary | ICD-10-CM | POA: Diagnosis not present

## 2015-06-16 DIAGNOSIS — I10 Essential (primary) hypertension: Secondary | ICD-10-CM | POA: Insufficient documentation

## 2015-06-16 DIAGNOSIS — J45909 Unspecified asthma, uncomplicated: Secondary | ICD-10-CM | POA: Diagnosis not present

## 2015-06-16 DIAGNOSIS — R0602 Shortness of breath: Secondary | ICD-10-CM | POA: Diagnosis present

## 2015-06-16 DIAGNOSIS — Z7951 Long term (current) use of inhaled steroids: Secondary | ICD-10-CM | POA: Insufficient documentation

## 2015-06-16 HISTORY — DX: Essential (primary) hypertension: I10

## 2015-06-16 HISTORY — DX: Type 2 diabetes mellitus without complications: E11.9

## 2015-06-16 LAB — CBC WITH DIFFERENTIAL/PLATELET
BASOS PCT: 1 %
Basophils Absolute: 0.1 10*3/uL (ref 0–0.1)
Eosinophils Absolute: 0.5 10*3/uL (ref 0–0.7)
Eosinophils Relative: 5 %
HCT: 44.6 % (ref 35.0–47.0)
Hemoglobin: 14.5 g/dL (ref 12.0–16.0)
Lymphocytes Relative: 30 %
Lymphs Abs: 2.9 10*3/uL (ref 1.0–3.6)
MCH: 29 pg (ref 26.0–34.0)
MCHC: 32.5 g/dL (ref 32.0–36.0)
MCV: 89.2 fL (ref 80.0–100.0)
MONO ABS: 0.6 10*3/uL (ref 0.2–0.9)
Monocytes Relative: 6 %
Neutro Abs: 5.6 10*3/uL (ref 1.4–6.5)
Neutrophils Relative %: 58 %
Platelets: 297 10*3/uL (ref 150–440)
RBC: 5 MIL/uL (ref 3.80–5.20)
RDW: 14 % (ref 11.5–14.5)
WBC: 9.7 10*3/uL (ref 3.6–11.0)

## 2015-06-16 LAB — COMPREHENSIVE METABOLIC PANEL
ALT: 41 U/L (ref 14–54)
ANION GAP: 11 (ref 5–15)
AST: 25 U/L (ref 15–41)
Albumin: 3.4 g/dL — ABNORMAL LOW (ref 3.5–5.0)
Alkaline Phosphatase: 81 U/L (ref 38–126)
BILIRUBIN TOTAL: 0.4 mg/dL (ref 0.3–1.2)
BUN: 18 mg/dL (ref 6–20)
CALCIUM: 9.2 mg/dL (ref 8.9–10.3)
CHLORIDE: 103 mmol/L (ref 101–111)
CO2: 24 mmol/L (ref 22–32)
Creatinine, Ser: 1.41 mg/dL — ABNORMAL HIGH (ref 0.44–1.00)
GFR calc Af Amer: 49 mL/min — ABNORMAL LOW (ref >60)
GFR calc non Af Amer: 43 mL/min — ABNORMAL LOW (ref >60)
Glucose, Bld: 142 mg/dL — ABNORMAL HIGH (ref 65–99)
Potassium: 3.8 mmol/L (ref 3.5–5.1)
Sodium: 138 mmol/L (ref 135–145)
Total Protein: 6.4 g/dL — ABNORMAL LOW (ref 6.5–8.1)

## 2015-06-16 LAB — TROPONIN I: Troponin I: <0.03 ng/mL (ref <0.031)

## 2015-06-16 LAB — MAGNESIUM: Magnesium: 1.8 mg/dL (ref 1.7–2.4)

## 2015-06-16 MED ORDER — DEXTROSE 5 % IV SOLN
INTRAVENOUS | Status: AC
  Filled 2015-06-16: qty 500

## 2015-06-16 MED ORDER — IPRATROPIUM-ALBUTEROL 0.5-2.5 (3) MG/3ML IN SOLN
RESPIRATORY_TRACT | Status: AC
  Administered 2015-06-16: 3 mL via RESPIRATORY_TRACT
  Filled 2015-06-16: qty 9

## 2015-06-16 MED ORDER — DEXTROSE 5 % IV SOLN: 500.0000 mg | Freq: Once | INTRAVENOUS | Status: DC

## 2015-06-16 MED ORDER — METHYLPREDNISOLONE SODIUM SUCC 125 MG IJ SOLR
125.0000 mg | Freq: Once | INTRAMUSCULAR | Status: AC
  Administered 2015-06-16: 125 mg via INTRAVENOUS

## 2015-06-16 MED ORDER — ALBUTEROL SULFATE HFA 108 (90 BASE) MCG/ACT IN AERS: INHALATION_SPRAY | RESPIRATORY_TRACT | Status: DC

## 2015-06-16 MED ORDER — PREDNISONE 20 MG PO TABS: 60.0000 mg | ORAL_TABLET | Freq: Every day | ORAL | Status: DC

## 2015-06-16 MED ORDER — METHYLPREDNISOLONE SODIUM SUCC 125 MG IJ SOLR
INTRAMUSCULAR | Status: AC
  Administered 2015-06-16: 125 mg via INTRAVENOUS
  Filled 2015-06-16: qty 2

## 2015-06-16 MED ORDER — IPRATROPIUM-ALBUTEROL 0.5-2.5 (3) MG/3ML IN SOLN
3.0000 mL | RESPIRATORY_TRACT | Status: AC
  Administered 2015-06-16 (×2): 3 mL via RESPIRATORY_TRACT

## 2015-06-16 NOTE — ED Notes (Signed)
Sick for few days with sob.  Today can't walk without dyspnea.

## 2015-06-16 NOTE — ED Provider Notes (Signed)
[facility] Emergency Department Provider Note  ____________________________________________  Time seen: Approximately 8:53 AM  I have reviewed the triage vital signs and the nursing notes.   HISTORY  Chief Complaint Shortness of Breath    HPI Samantha [name] is a 49 y.o. female with a history of COPD and asthma who presents with 3 days of worsening shortness of breath in spite of using her regular asthma medications.  She states that this feels like her usual asthma/COPD exacerbations but it is not quite as bad as it can get.  She states that she had a cold for several days (cough, nasal congestion, runny nose) and that having a cold always makes her breathing worse.  She denies fever/chills, chest pain, abdominal pain, dysuria.    She states that any sort of exertion makes her breathing worse.  Rest makes it a little bit better.  The shortness of breath is constant but waxes and wanes in intensity from moderate to severe.   Past Medical History  Diagnosis Date  . Asthma   . Hypertension   . Diabetes mellitus without complication     There are no active problems to display for this patient.   No past surgical history on file.  Current Outpatient Rx  Name  Route  Sig  Dispense  Refill  . Fluticasone-Salmeterol (ADVAIR) 500-50 MCG/DOSE AEPB   Inhalation   Inhale 1 puff into the lungs 2 (two) times daily.         Marland Kitchen. gabapentin (NEURONTIN) 300 MG capsule   Oral   Take 300 mg by mouth 2 (two) times daily.      0   . glimepiride (AMARYL) 4 MG tablet   Oral   Take 0.5 tablets by mouth 2 (two) times daily.      0   . losartan-hydrochlorothiazide (HYZAAR) 50-12.5 MG per tablet   Oral   Take 1 tablet by mouth daily.      0   . montelukast (SINGULAIR) 10 MG tablet   Oral   Take 1 tablet by mouth at bedtime.      0   . SYMBICORT 160-4.5 MCG/ACT inhaler   Inhalation   Inhale 2 puffs into the lungs every 12 (twelve) hours.          Dispense as written.   . tiotropium (SPIRIVA) 18 MCG inhalation capsule   Inhalation   Place 18 mcg into inhaler and inhale daily.         .           .             Allergies Review of patient's allergies indicates no known allergies.  No family history on file.  Social History History  Substance Use Topics  . Smoking status: Former Games developermoker  . Smokeless tobacco: Not on file  . Alcohol Use: No    Review of Systems Constitutional: No fever/chills Eyes: No visual changes. ENT: No sore throat.  Some runny nose  Cardiovascular: Denies chest pain. Respiratory: Increased shortness of breath for 3 days Gastrointestinal: No abdominal pain.  No nausea, no vomiting.  No diarrhea.  No constipation. Genitourinary: Negative for dysuria. Musculoskeletal: Negative for back pain. Skin: Negative for rash. Neurological: Negative for headaches, focal weakness or numbness.  10-point ROS otherwise negative.  ____________________________________________   PHYSICAL EXAM:  VITAL SIGNS: ED Triage Vitals  Enc Vitals Group     BP 06/16/15 0840 144/90 mmHg     Pulse Rate 06/16/15  0840 88     Resp 06/16/15 0840 20     Temp 06/16/15 0840 97.8 F (36.6 C)     Temp Source 06/16/15 0840 Oral     SpO2 06/16/15 0840 96 %     Weight 06/16/15 0840 270 lb (122.471 kg)     Height 06/16/15 0840 5\' 7"  (1.702 m)     Head Cir --      Peak Flow --      Pain Score 06/16/15 0841 0     Pain Loc --      Pain Edu? --      Excl. in GC? --     Constitutional: Alert and oriented.  Patient is in moderate distress. Eyes: Conjunctivae are normal. PERRL. EOMI. Head: Atraumatic. Nose: No congestion/rhinnorhea. Mouth/Throat: Mucous membranes are moist.  Oropharynx non-erythematous. Neck: No stridor.   Cardiovascular: Normal rate, regular rhythm. Grossly normal heart sounds.  Good peripheral circulation. Respiratory: Increased respiratory effort with retractions and accessory muscle usage.  Decreased  breath sounds throughout, prolonged expiratory phase, significant expiratory wheezing throughout Gastrointestinal: Obese, Soft and nontender. No distention. No abdominal bruits. No CVA tenderness. Musculoskeletal: No lower extremity tenderness nor edema.  No joint effusions. Neurologic:  Normal speech and language. No gross focal neurologic deficits are appreciated. Speech is normal. Skin:  Skin is warm, dry and intact. No rash noted. Psychiatric: Mood and affect are normal. Speech and behavior are normal.  ____________________________________________   LABS (all labs ordered are listed, but only abnormal results are displayed)  Labs Reviewed  CBC WITH DIFFERENTIAL/PLATELET  COMPREHENSIVE METABOLIC PANEL  TROPONIN I  MAGNESIUM   ____________________________________________  EKG  ED ECG REPORT I, Annick Dimaio, the attending physician, personally viewed and interpreted this ECG.  Date: 06/16/2015 EKG Time: 09:57 Rate: 89 Rhythm: normal sinus rhythm QRS Axis: normal Intervals: normal ST/T Wave abnormalities: normal Conduction Disutrbances: none Narrative Interpretation: unremarkable  ____________________________________________  RADIOLOGY  Dg Chest Port 1 View  06/16/2015   CLINICAL DATA:  Shortness of Breath  EXAM: PORTABLE CHEST - 1 VIEW  COMPARISON:  June 02, 2015  FINDINGS: There is no edema or consolidation. The heart size and pulmonary vascularity are normal. No adenopathy. No bone lesions.  IMPRESSION: No edema or consolidation.   Electronically Signed   By: Bretta Bang III M.D.   On: 06/16/2015 09:13   Dg Chest Portable 1 View  06/02/2015   CLINICAL DATA:  Shortness of breath and wheezing  EXAM: PORTABLE CHEST - 1 VIEW  COMPARISON:  03/10/2015  FINDINGS: The heart size and mediastinal contours are within normal limits. Both lungs are clear. The visualized skeletal structures are unremarkable.  IMPRESSION: No active disease.   Electronically Signed   By: Signa Kell M.D.   On: 06/02/2015 15:56    ____________________________________________   PROCEDURES  Procedure(s) performed: None  Critical Care performed: No ____________________________________________   INITIAL IMPRESSION / ASSESSMENT AND PLAN / ED COURSE  Pertinent labs & imaging results that were available during my care of the patient were reviewed by me and considered in my medical decision making (see chart for details).  The patient feels much better after 3 DuoNeb's and Solu-Medrol.  On auscultation her wheezing has improved significantly.  She states she is ready to go home.  I discharged her with the medications listed below with my usual and customary return precautions.  ____________________________________________  FINAL CLINICAL IMPRESSION(S) / ED DIAGNOSES  Final diagnoses:  Acute exacerbation of COPD with asthma  NEW MEDICATIONS STARTED DURING THIS VISIT:  New Prescriptions   ALBUTEROL (PROVENTIL HFA;VENTOLIN HFA) 108 (90 BASE) MCG/ACT INHALER    Inhale 4-6 puffs by mouth every 4 hours as needed for wheezing, cough, and/or shortness of breath   PREDNISONE (DELTASONE) 20 MG TABLET    Take 3 tablets (60 mg total) by mouth daily.     Loleta Rose, MD 06/16/15 1013

## 2015-06-16 NOTE — Discharge Instructions (Signed)
We believe that your symptoms are caused today by an exacerbation of your COPD.  Please take the prescribed medications and any medications that you have at home for your COPD.  Follow up with your doctor as recommended.  If you develop any new or worsening symptoms, including but not limited to fever, persistent vomiting, worsening shortness of breath, or other symptoms that concern you, please return to the Emergency Department immediately.   Chronic Obstructive Pulmonary Disease Chronic obstructive pulmonary disease (COPD) is a common lung condition in which airflow from the lungs is limited. COPD is a general term that can be used to describe many different lung problems that limit airflow, including both chronic bronchitis and emphysema. If you have COPD, your lung function will probably never return to normal, but there are measures you can take to improve lung function and make yourself feel better.  CAUSES   Smoking (common).   Exposure to secondhand smoke.   Genetic problems.  Chronic inflammatory lung diseases or recurrent infections. SYMPTOMS   Shortness of breath, especially with physical activity.   Deep, persistent (chronic) cough with a large amount of thick mucus.   Wheezing.   Rapid breaths (tachypnea).   Gray or bluish discoloration (cyanosis) of the skin, especially in fingers, toes, or lips.   Fatigue.   Weight loss.   Frequent infections or episodes when breathing symptoms become much worse (exacerbations).   Chest tightness. DIAGNOSIS  Your health care provider will take a medical history and perform a physical examination to make the initial diagnosis. Additional tests for COPD may include:   Lung (pulmonary) function tests.  Chest X-ray.  CT scan.  Blood tests. TREATMENT  Treatment available to help you feel better when you have COPD includes:   Inhaler and nebulizer medicines. These help manage the symptoms of COPD and make your  breathing more comfortable.  Supplemental oxygen. Supplemental oxygen is only helpful if you have a low oxygen level in your blood.   Exercise and physical activity. These are beneficial for nearly all people with COPD. Some people may also benefit from a pulmonary rehabilitation program. HOME CARE INSTRUCTIONS   Take all medicines (inhaled or pills) as directed by your health care provider.  Avoid over-the-counter medicines or cough syrups that dry up your airway (such as antihistamines) and slow down the elimination of secretions unless instructed otherwise by your health care provider.   If you are a smoker, the most important thing that you can do is stop smoking. Continuing to smoke will cause further lung damage and breathing trouble. Ask your health care provider for help with quitting smoking. He or she can direct you to community resources or hospitals that provide support.  Avoid exposure to irritants such as smoke, chemicals, and fumes that aggravate your breathing.  Use oxygen therapy and pulmonary rehabilitation if directed by your health care provider. If you require home oxygen therapy, ask your health care provider whether you should purchase a pulse oximeter to measure your oxygen level at home.   Avoid contact with individuals who have a contagious illness.  Avoid extreme temperature and humidity changes.  Eat healthy foods. Eating smaller, more frequent meals and resting before meals may help you maintain your strength.  Stay active, but balance activity with periods of rest. Exercise and physical activity will help you maintain your ability to do things you want to do.  Preventing infection and hospitalization is very important when you have COPD. Make sure to receive all  the vaccines your health care provider recommends, especially the pneumococcal and influenza vaccines. Ask your health care provider whether you need a pneumonia vaccine.  Learn and use relaxation  techniques to manage stress.  Learn and use controlled breathing techniques as directed by your health care provider. Controlled breathing techniques include:   Pursed lip breathing. Start by breathing in (inhaling) through your nose for 1 second. Then, purse your lips as if you were going to whistle and breathe out (exhale) through the pursed lips for 2 seconds.   Diaphragmatic breathing. Start by putting one hand on your abdomen just above your waist. Inhale slowly through your nose. The hand on your abdomen should move out. Then purse your lips and exhale slowly. You should be able to feel the hand on your abdomen moving in as you exhale.   Learn and use controlled coughing to clear mucus from your lungs. Controlled coughing is a series of short, progressive coughs. The steps of controlled coughing are:  1. Lean your head slightly forward.  2. Breathe in deeply using diaphragmatic breathing.  3. Try to hold your breath for 3 seconds.  4. Keep your mouth slightly open while coughing twice.  5. Spit any mucus out into a tissue.  6. Rest and repeat the steps once or twice as needed. SEEK MEDICAL CARE IF:   You are coughing up more mucus than usual.   There is a change in the color or thickness of your mucus.   Your breathing is more labored than usual.   Your breathing is faster than usual.  SEEK IMMEDIATE MEDICAL CARE IF:   You have shortness of breath while you are resting.   You have shortness of breath that prevents you from:  Being able to talk.   Performing your usual physical activities.   You have chest pain lasting longer than 5 minutes.   Your skin color is more cyanotic than usual.  You measure low oxygen saturations for longer than 5 minutes with a pulse oximeter. MAKE SURE YOU:   Understand these instructions.  Will watch your condition.  Will get help right away if you are not doing well or get worse. Document Released: 09/07/2005 Document  Revised: 04/14/2014 Document Reviewed: 07/25/2013 [facility] Patient Information 2015 [place], [place]. This information is not intended to replace advice given to you by your health care provider. Make sure you discuss any questions you have with your health care provider.  Asthma Asthma is a condition of the lungs in which the airways tighten and narrow. Asthma can make it hard to breathe. Asthma cannot be cured, but medicine and lifestyle changes can help control it. Asthma may be started (triggered) by:  Animal skin flakes (dander).  Dust.  Cockroaches.  Pollen.  Mold.  Smoke.  Cleaning products.  Hair sprays or aerosol sprays.  Paint fumes or strong smells.  Cold air, weather changes, and winds.  Crying or laughing hard.  Stress.  Certain medicines or drugs.  Foods, such as dried fruit, potato chips, and sparkling grape juice.  Infections or conditions (colds, flu).  Exercise.  Certain medical conditions or diseases.  Exercise or tiring activities. HOME CARE   Take medicine as told by your doctor.  Use a peak flow meter as told by your doctor. A peak flow meter is a tool that measures how well the lungs are working.  Record and keep track of the peak flow meter's readings.  Understand and use the asthma action plan. An asthma  action plan is a written plan for taking care of your asthma and treating your attacks.  To help prevent asthma attacks:  Do not smoke. Stay away from secondhand smoke.  Change your heating and air conditioning filter often.  Limit your use of fireplaces and wood stoves.  Get rid of pests (such as roaches and mice) and their droppings.  Throw away plants if you see mold on them.  Clean your floors. Dust regularly. Use cleaning products that do not smell.  Have someone vacuum when you are not home. Use a vacuum cleaner with a HEPA filter if possible.  Replace carpet with wood, tile, or vinyl flooring. Carpet can trap animal  skin flakes and dust.  Use allergy-proof pillows, mattress covers, and box spring covers.  Wash bed sheets and blankets every week in hot water and dry them in a dryer.  Use blankets that are made of polyester or cotton.  Clean bathrooms and kitchens with bleach. If possible, have someone repaint the walls in these rooms with mold-resistant paint. Keep out of the rooms that are being cleaned and painted.  Wash hands often. GET HELP IF:  You have make a whistling sound when breaking (wheeze), have shortness of breath, or have a cough even if taking medicine to prevent attacks.  The colored mucus you cough up (sputum) is thicker than usual.  The colored mucus you cough up changes from clear or white to yellow, green, gray, or bloody.  You have problems from the medicine you are taking such as:  A rash.  Itching.  Swelling.  Trouble breathing.  You need reliever medicines more than 2-3 times a week.  Your peak flow measurement is still at 50-79% of your personal best after following the action plan for 1 hour.  You have a fever. GET HELP RIGHT AWAY IF:   You seem to be worse and are not responding to medicine during an asthma attack.  You are short of breath even at rest.  You get short of breath when doing very little activity.  You have trouble eating, drinking, or talking.  You have chest pain.  You have a fast heartbeat.  Your lips or fingernails start to turn blue.  You are light-headed, dizzy, or faint.  Your peak flow is less than 50% of your personal best. MAKE SURE YOU:   Understand these instructions.  Will watch your condition.  Will get help right away if you are not doing well or get worse. Document Released: 05/16/2008 Document Revised: 04/14/2014 Document Reviewed: 06/27/2013 [facility] Patient Information 2015 [place]. This information is not intended to replace advice given to you by your health care provider. Make sure you discuss any  questions you have with your health care provider.

## 2015-06-29 IMAGING — CR DG CHEST 2V
1 series · 2 of 2 positions shown · non-contrast
Comparison: DG CHEST 2V dated 01/16/2014

CLINICAL DATA: Cough and dyspnea and labored breathing, history of
COPD

EXAM:
CHEST  2 VIEW

[Series 1: w chest pa · 0.14mm/px · 2 of 2 slices shown]
[im 1/2]
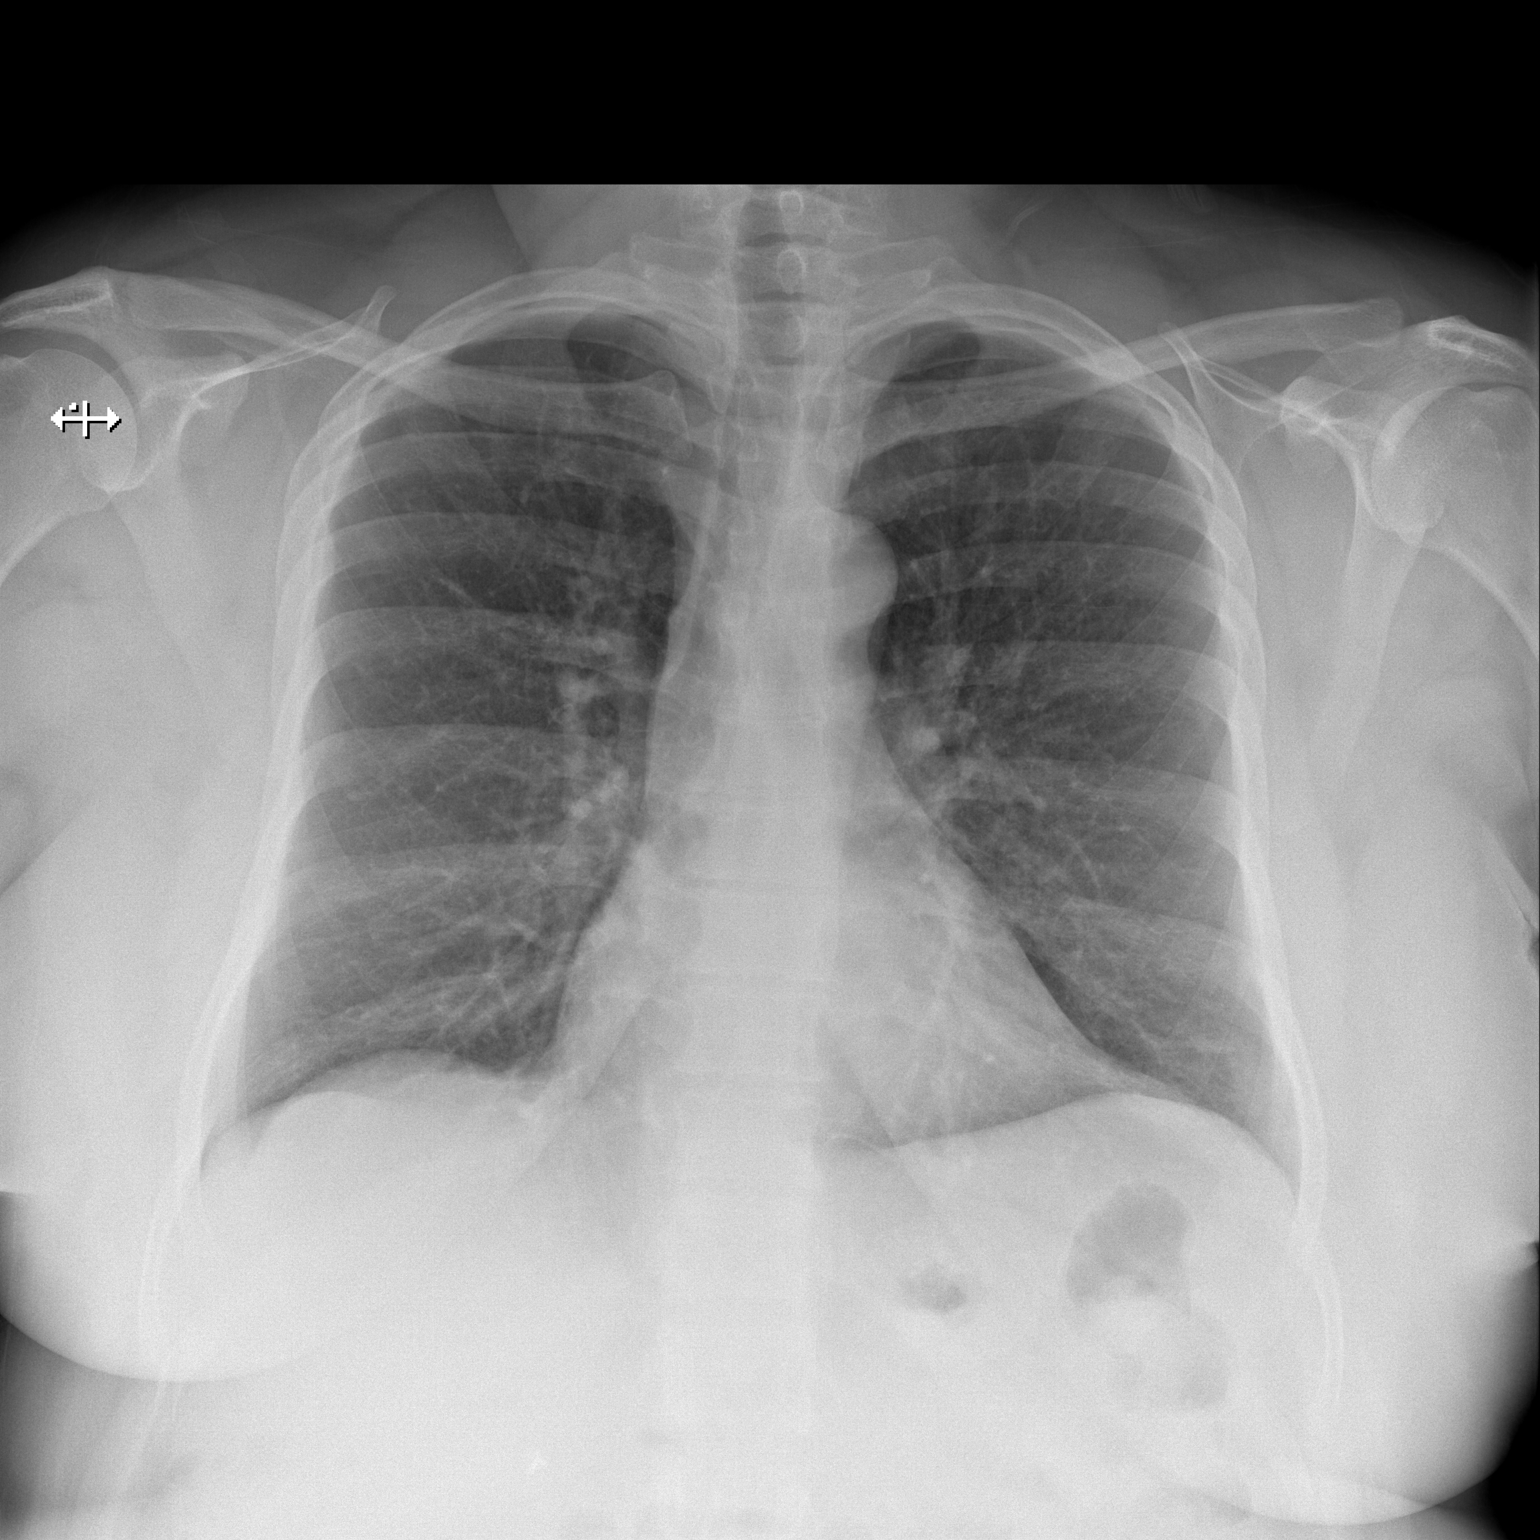
[im 2/2]
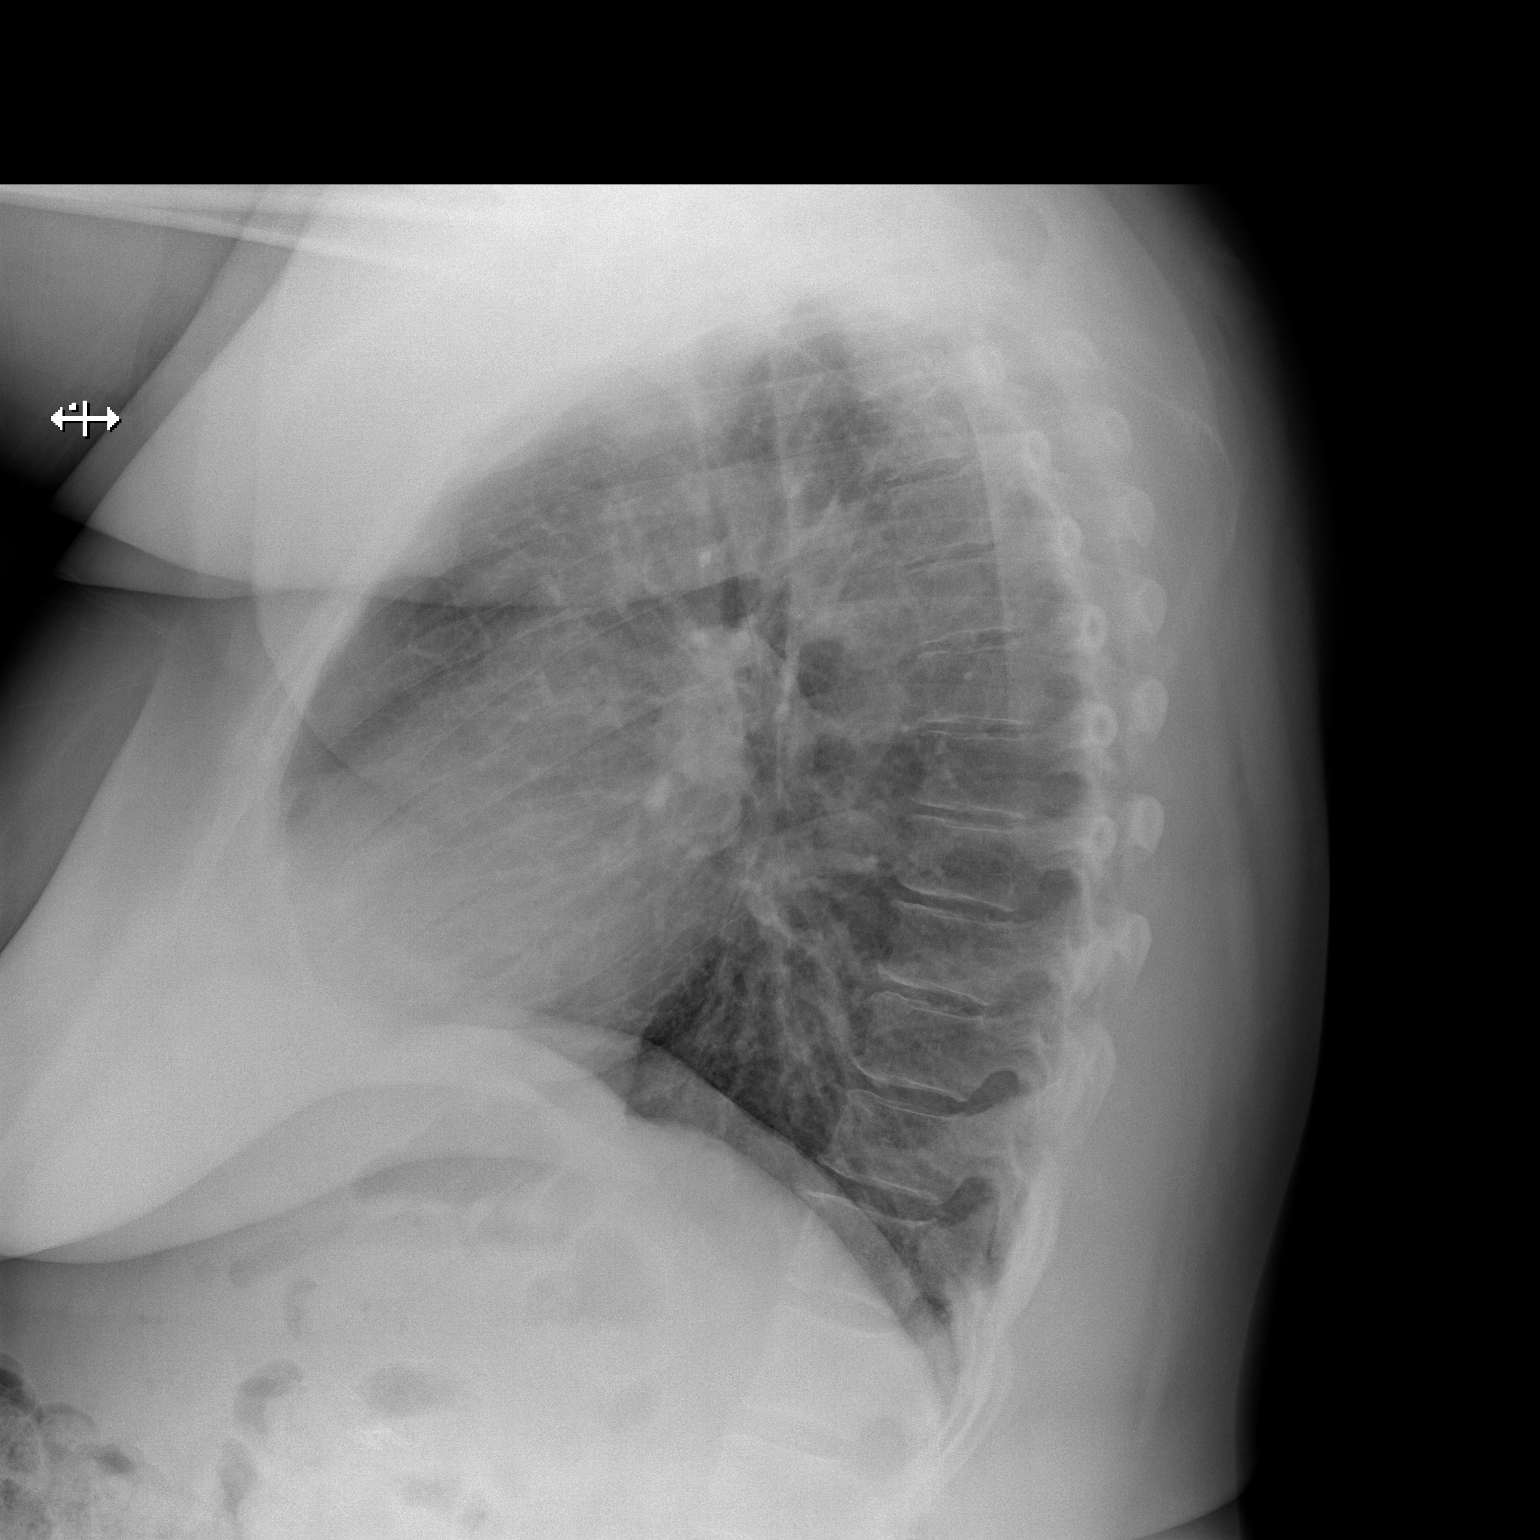

[2 of 2 positions shown; findings below may reference images not displayed]

FINDINGS: The lungs are adequately inflated. There is no focal infiltrate.
There are mild stable increased interstitial markings bilaterally.
The cardiac silhouette is normal in size. The pulmonary vascularity
is not engorged. The mediastinum is normal in width. There is no
pleural effusion or pneumothorax. The trachea is midline. The
observed portions of the bony thorax exhibit no acute abnormalities.
IMPRESSION: The lungs are well-expanded and the interstitial markings are mildly
prominent which may reflect clinically known COPD. There is no
evidence of pneumonia nor CHF or other acute cardiopulmonary
disease.

## 2015-06-30 ENCOUNTER — Emergency Department
Admission: EM | Admit: 2015-06-30 | Discharge: 2015-06-30 | Disposition: A | Discharge status: Home / Self Care | Payer: Medicare Other | Attending: Emergency Medicine | Admitting: Emergency Medicine

## 2015-06-30 ENCOUNTER — Encounter: Payer: Self-pay | Admitting: Medical Oncology

## 2015-06-30 ENCOUNTER — Other Ambulatory Visit: Payer: Self-pay

## 2015-06-30 ENCOUNTER — Emergency Department: Payer: Medicare Other

## 2015-06-30 DIAGNOSIS — E119 Type 2 diabetes mellitus without complications: Secondary | ICD-10-CM | POA: Insufficient documentation

## 2015-06-30 DIAGNOSIS — Z7951 Long term (current) use of inhaled steroids: Secondary | ICD-10-CM | POA: Diagnosis not present

## 2015-06-30 DIAGNOSIS — J441 Chronic obstructive pulmonary disease with (acute) exacerbation: Secondary | ICD-10-CM | POA: Diagnosis not present

## 2015-06-30 DIAGNOSIS — R0602 Shortness of breath: Secondary | ICD-10-CM | POA: Diagnosis present

## 2015-06-30 DIAGNOSIS — Z79899 Other long term (current) drug therapy: Secondary | ICD-10-CM | POA: Insufficient documentation

## 2015-06-30 DIAGNOSIS — Z87891 Personal history of nicotine dependence: Secondary | ICD-10-CM | POA: Insufficient documentation

## 2015-06-30 DIAGNOSIS — I10 Essential (primary) hypertension: Secondary | ICD-10-CM | POA: Insufficient documentation

## 2015-06-30 LAB — CBC
HCT: 45.4 % (ref 35.0–47.0)
HEMOGLOBIN: 14.8 g/dL (ref 12.0–16.0)
MCH: 28.7 pg (ref 26.0–34.0)
MCHC: 32.5 g/dL (ref 32.0–36.0)
MCV: 88.4 fL (ref 80.0–100.0)
Platelets: 296 10*3/uL (ref 150–440)
RBC: 5.14 MIL/uL (ref 3.80–5.20)
RDW: 14.6 % — AB (ref 11.5–14.5)
WBC: 8.4 10*3/uL (ref 3.6–11.0)

## 2015-06-30 LAB — TROPONIN I: Troponin I: <0.03 ng/mL (ref <0.031)

## 2015-06-30 LAB — BASIC METABOLIC PANEL
ANION GAP: 7 (ref 5–15)
BUN: 22 mg/dL — ABNORMAL HIGH (ref 6–20)
CHLORIDE: 108 mmol/L (ref 101–111)
CO2: 23 mmol/L (ref 22–32)
Calcium: 9 mg/dL (ref 8.9–10.3)
Creatinine, Ser: 1.6 mg/dL — ABNORMAL HIGH (ref 0.44–1.00)
GFR calc Af Amer: 42 mL/min — ABNORMAL LOW (ref >60)
GFR, EST NON AFRICAN AMERICAN: 37 mL/min — AB (ref >60)
Glucose, Bld: 179 mg/dL — ABNORMAL HIGH (ref 65–99)
Potassium: 4.2 mmol/L (ref 3.5–5.1)
SODIUM: 138 mmol/L (ref 135–145)

## 2015-06-30 MED ORDER — AZITHROMYCIN 250 MG PO TABS
500.0000 mg | ORAL_TABLET | Freq: Once | ORAL | Status: AC
  Administered 2015-06-30: 500 mg via ORAL
  Filled 2015-06-30: qty 2

## 2015-06-30 MED ORDER — IPRATROPIUM-ALBUTEROL 0.5-2.5 (3) MG/3ML IN SOLN
3.0000 mL | Freq: Once | RESPIRATORY_TRACT | Status: AC
  Administered 2015-06-30: 3 mL via RESPIRATORY_TRACT
  Filled 2015-06-30: qty 3

## 2015-06-30 MED ORDER — PREDNISONE 20 MG PO TABS
ORAL_TABLET | ORAL | Status: AC
  Filled 2015-06-30: qty 2

## 2015-06-30 MED ORDER — PREDNISONE 20 MG PO TABS: 40.0000 mg | ORAL_TABLET | Freq: Every day | ORAL | Status: DC

## 2015-06-30 MED ORDER — AZITHROMYCIN 250 MG PO TABS: ORAL_TABLET | ORAL | Status: DC

## 2015-06-30 MED ORDER — METHYLPREDNISOLONE SODIUM SUCC 125 MG IJ SOLR
125.0000 mg | INTRAMUSCULAR | Status: AC
  Administered 2015-06-30: 125 mg via INTRAVENOUS
  Filled 2015-06-30: qty 2

## 2015-06-30 MED ORDER — PREDNISONE 20 MG PO TABS
40.0000 mg | ORAL_TABLET | Freq: Once | ORAL | Status: AC
  Administered 2015-06-30: 40 mg via ORAL

## 2015-06-30 NOTE — Discharge Instructions (Signed)

## 2015-06-30 NOTE — ED Provider Notes (Signed)
[facility] Emergency Department Provider Note  ____________________________________________  Time seen: Approximately 10:05 AM  I have reviewed the triage vital signs and the nursing notes.   HISTORY  Chief Complaint COPD and Shortness of Breath    HPI Samantha [name] is a 49 y.o. female history of COPD and asthma. She states that she is had about 2 days now of increased wheezing and some mild shortness of breath especially when walking. She states that this is happened her many a time, and this feels like her COPD though she would describe it as relatively mild right now. She comes stating that she needs to get some steroid started.  No chest pain, though she does state her chest feels tight from wheezing. No fevers or chills. She's had a slight dry cough. She states she was last on steroid about 3 weeks ago with similar but slightly worse symptoms.  She does have an oxygen concentrator at home which she occasionally uses, but she states she has been using nebulizers which she used 4 times yesterday and once this morning.  No leg swelling. She has been hospitalized for COPD previously and states she was intubated once, but her symptoms were much much worse then.  Past Medical History  Diagnosis Date  . Asthma   . Hypertension   . Diabetes mellitus without complication     There are no active problems to display for this patient.   History reviewed. No pertinent past surgical history.  Current Outpatient Rx  Name  Route  Sig  Dispense  Refill  . albuterol (PROVENTIL HFA;VENTOLIN HFA) 108 (90 BASE) MCG/ACT inhaler      Inhale 4-6 puffs by mouth every 4 hours as needed for wheezing, cough, and/or shortness of breath   1 Inhaler   1   . Fluticasone-Salmeterol (ADVAIR) 500-50 MCG/DOSE AEPB   Inhalation   Inhale 1 puff into the lungs 2 (two) times daily.         Marland Kitchen gabapentin (NEURONTIN) 300 MG capsule   Oral   Take 300 mg by mouth 2 (two) times  daily.      0   . glimepiride (AMARYL) 4 MG tablet   Oral   Take 0.5 tablets by mouth 2 (two) times daily.      0   . losartan-hydrochlorothiazide (HYZAAR) 50-12.5 MG per tablet   Oral   Take 1 tablet by mouth daily.      0   . montelukast (SINGULAIR) 10 MG tablet   Oral   Take 1 tablet by mouth at bedtime.      0   . SYMBICORT 160-4.5 MCG/ACT inhaler   Inhalation   Inhale 2 puffs into the lungs every 12 (twelve) hours.           Dispense as written.   . tiotropium (SPIRIVA) 18 MCG inhalation capsule   Inhalation   Place 18 mcg into inhaler and inhale daily.         Marland Kitchen azithromycin (ZITHROMAX) 250 MG tablet      Take 1 tablet daily by mouth   4 tablet   0   . predniSONE (DELTASONE) 20 MG tablet   Oral   Take 2 tablets (40 mg total) by mouth daily.   10 tablet   0     Allergies Percocet  No family history on file.  Social History History  Substance Use Topics  . Smoking status: Former Games developer  . Smokeless tobacco: Not on file  .  Alcohol Use: No    Review of Systems Constitutional: No fever/chills Eyes: No visual changes. ENT: No sore throat. Cardiovascular: Denies chest pain. Respiratory: See history of present illness  Gastrointestinal: No abdominal pain.  No nausea, no vomiting.  No diarrhea.  No constipation. Genitourinary: Negative for dysuria. Musculoskeletal: Negative for back pain. Skin: Negative for rash. Neurological: Negative for headaches, focal weakness or numbness.  10-point ROS otherwise negative.  ____________________________________________   PHYSICAL EXAM:  VITAL SIGNS: ED Triage Vitals  Enc Vitals Group     BP 06/30/15 0948 120/81 mmHg     Pulse Rate 06/30/15 0948 101     Resp 06/30/15 0948 24     Temp 06/30/15 0948 97.7 F (36.5 C)     Temp Source 06/30/15 0948 Oral     SpO2 06/30/15 0948 96 %     Weight 06/30/15 0948 270 lb (122.471 kg)     Height 06/30/15 0948 5\' 7"  (1.702 m)     Head Cir --      Peak  Flow --      Pain Score 06/30/15 0949 0     Pain Loc --      Pain Edu? --      Excl. in GC? --     Constitutional: Alert and oriented. Well appearing and in no acute distress. Eyes: Conjunctivae are normal. PERRL. EOMI. Head: Atraumatic. Nose: No congestion/rhinnorhea. Mouth/Throat: Mucous membranes are moist.  Oropharynx non-erythematous. Neck: No stridor.   Cardiovascular: Normal rate, regular rhythm. Grossly normal heart sounds.  Good peripheral circulation. Respiratory: Patient exhibits mild use of accessory muscles. There is diffuse end expiratory wheezing throughout. She does speak in short sentences, and appears to have mild increased work of breathing overall. No retractions.  Gastrointestinal: Soft and nontender. No distention. No abdominal bruits. No CVA tenderness. Musculoskeletal: No lower extremity tenderness nor edema.  No joint effusions. Neurologic:  Normal speech and language. No gross focal neurologic deficits are appreciated.  Skin:  Skin is warm, dry and intact. No rash noted. Psychiatric: Mood and affect are normal. Speech and behavior are normal. The patient is very amicable.  ____________________________________________   LABS (all labs ordered are listed, but only abnormal results are displayed)  Labs Reviewed  BASIC METABOLIC PANEL - Abnormal; Notable for the following:    Glucose, Bld 179 (*)    BUN 22 (*)    Creatinine, Ser 1.60 (*)    GFR calc non Af Amer 37 (*)    GFR calc Af Amer 42 (*)    All other components within normal limits  CBC - Abnormal; Notable for the following:    RDW 14.6 (*)    All other components within normal limits  TROPONIN I   ____________________________________________  EKG  ED ECG REPORT I, QUALE, MARK, the attending physician, personally viewed and interpreted this ECG.  Date: 06/30/2015 EKG Time: 10 AM Rate: 95 Rhythm: normal sinus rhythm QRS Axis: normal Intervals: normal ST/T Wave abnormalities:  normal Conduction Disutrbances: none Narrative Interpretation: Questionable some left atrial enlargement, otherwise normal 12-lead  ____________________________________________  RADIOLOGY  DG Chest Port 1 View (Final result) Result time: 06/30/15 10:28:56   Final result by Rad Results In Interface (06/30/15 10:28:56)   Narrative:   CLINICAL DATA: Two day history of wheezing and shortness of breath  EXAM: PORTABLE CHEST - 1 VIEW  COMPARISON: June 16, 2015  FINDINGS: There is no edema or consolidation. The heart size and pulmonary vascularity are normal. No adenopathy. No pneumothorax.  No bone lesions.  IMPRESSION: No abnormality noted.     ____________________________________________   PROCEDURES  Procedure(s) performed: None  Critical Care performed: No  ____________________________________________   INITIAL IMPRESSION / ASSESSMENT AND PLAN / ED COURSE  Pertinent labs & imaging results that were available during my care of the patient were reviewed by me and considered in my medical decision making (see chart for details).  Patient presents with symptoms that appear most consistent with her prior history of COPD for which she has been good as far as a steroid responsive patient. She has appropriate nebulizer treatments at home, but despite this seems to have ongoing shortness of breath. I describe her symptoms today presently as mild but do feel that IV Solu-Medrol and stacked nebulizer treatment here will improve her symptoms. We'll obtain chest x-ray to evaluate for pneumonia. EKG demonstrates no ischemic change and her symptoms do not seem to match that of ACS. We will continue to monitor how she does, anticipated this point that she will likely be able to go home based on her mild symptomatology, but we will follow up closely.  ----------------------------------------- 11:32 AM on 06/30/2015 -----------------------------------------  Recent lab tests have  resulted in her reassuring. Creatinine appears to be at patient's relative baseline. On reexam the patient reports that she feels much improved. She is currently saturating normally, her respiratory rate is normal, she is speaking in full sentences in no increased work of breathing is evident. Lungs are much improved with only some very scant minimal end expiratory wheezing.  Anticipate discharge to home pending troponin. I discussed return precautions and treatment. Patient has ample supply of nebulizer solutions of albuterol at home, but we will provide her prescription for prednisone as well as azithromycin. She'll follow-up with her doctor and careful return precautions were discussed.  Condition much improved. ____________________________________________   FINAL CLINICAL IMPRESSION(S) / ED DIAGNOSES  Final diagnoses:  COPD with acute exacerbation      Sharyn Creamer, MD 06/30/15 1239

## 2015-06-30 NOTE — ED Notes (Signed)
Pt reports that she began having worsening sob x 2 days with wheezing and productive cough, hx of copd.

## 2015-06-30 NOTE — ED Notes (Signed)
Patient to nursing station stating "I need to leave hospital now, my family is waiting out in the heat to take me home".  Dr. Fanny BienQuale informed that patient was leaving.  Dr. Fanny BienQuale requested that nurse call and see how long it would take to get troponin result back.  [facility]  Vicente MalesKaylyn reported that it would take 5 minutes for result to populate.  Patient informed and refused to wait.  Patient also refused for last set of vitals to be taken.  Dr. Fanny BienQuale printed discharge instructions.  Patient to be discharged.

## 2015-07-31 ENCOUNTER — Emergency Department: Payer: Medicare Other

## 2015-07-31 ENCOUNTER — Emergency Department
Admission: EM | Admit: 2015-07-31 | Discharge: 2015-07-31 | Disposition: A | Discharge status: Home / Self Care | Payer: Medicare Other | Attending: Emergency Medicine | Admitting: Emergency Medicine

## 2015-07-31 ENCOUNTER — Encounter: Payer: Self-pay | Admitting: Emergency Medicine

## 2015-07-31 ENCOUNTER — Other Ambulatory Visit: Payer: Self-pay

## 2015-07-31 DIAGNOSIS — E119 Type 2 diabetes mellitus without complications: Secondary | ICD-10-CM | POA: Insufficient documentation

## 2015-07-31 DIAGNOSIS — J441 Chronic obstructive pulmonary disease with (acute) exacerbation: Secondary | ICD-10-CM | POA: Insufficient documentation

## 2015-07-31 DIAGNOSIS — Z87891 Personal history of nicotine dependence: Secondary | ICD-10-CM | POA: Insufficient documentation

## 2015-07-31 DIAGNOSIS — R0602 Shortness of breath: Secondary | ICD-10-CM | POA: Diagnosis present

## 2015-07-31 DIAGNOSIS — J45901 Unspecified asthma with (acute) exacerbation: Secondary | ICD-10-CM | POA: Diagnosis not present

## 2015-07-31 DIAGNOSIS — Z79811 Long term (current) use of aromatase inhibitors: Secondary | ICD-10-CM | POA: Diagnosis not present

## 2015-07-31 DIAGNOSIS — Z79899 Other long term (current) drug therapy: Secondary | ICD-10-CM | POA: Diagnosis not present

## 2015-07-31 DIAGNOSIS — I10 Essential (primary) hypertension: Secondary | ICD-10-CM | POA: Diagnosis not present

## 2015-07-31 DIAGNOSIS — Z7951 Long term (current) use of inhaled steroids: Secondary | ICD-10-CM | POA: Diagnosis not present

## 2015-07-31 MED ORDER — IPRATROPIUM-ALBUTEROL 0.5-2.5 (3) MG/3ML IN SOLN
RESPIRATORY_TRACT | Status: AC
  Filled 2015-07-31: qty 3

## 2015-07-31 MED ORDER — IPRATROPIUM-ALBUTEROL 0.5-2.5 (3) MG/3ML IN SOLN
3.0000 mL | Freq: Once | RESPIRATORY_TRACT | Status: AC
  Administered 2015-07-31: 3 mL via RESPIRATORY_TRACT
  Filled 2015-07-31: qty 3

## 2015-07-31 MED ORDER — ALBUTEROL SULFATE HFA 108 (90 BASE) MCG/ACT IN AERS: 2.0000 | INHALATION_SPRAY | Freq: Four times a day (QID) | RESPIRATORY_TRACT | Status: DC | PRN

## 2015-07-31 MED ORDER — PREDNISONE 20 MG PO TABS: 40.0000 mg | ORAL_TABLET | Freq: Every day | ORAL | Status: DC

## 2015-07-31 MED ORDER — IPRATROPIUM-ALBUTEROL 0.5-2.5 (3) MG/3ML IN SOLN
3.0000 mL | Freq: Once | RESPIRATORY_TRACT | Status: AC
  Administered 2015-07-31: 3 mL via RESPIRATORY_TRACT

## 2015-07-31 MED ORDER — PREDNISONE 20 MG PO TABS
40.0000 mg | ORAL_TABLET | Freq: Once | ORAL | Status: AC
  Administered 2015-07-31: 40 mg via ORAL
  Filled 2015-07-31: qty 2

## 2015-07-31 NOTE — ED Provider Notes (Signed)
[facility] Emergency Department Provider Note  ____________________________________________  Time seen: Approximately 11:56 AM  I have reviewed the triage vital signs and the nursing notes.   HISTORY  Chief Complaint Asthma    HPI Midge A Kaffenberger is a 49 y.o. female the history of COPD and asthma. She has previously once been intubated per her.  She comes in today, states that she has been wheezing for about the last day. She has used albuterol at home, and this has helped but she continues to wheeze and feels that she needs to go back on steroid. She describes feeling tight across the chest, but is not having any chest pain. She denies any nausea or vomiting. No abdominal pain. Denies pregnancy. She otherwise feels well. She denies any fevers or chills. She has a slight nonproductive cough which she attributed to "asthma".  In discussion, patient reports she's had these symptoms many times, and in fact she identifies me as the last seen her for a similar attack but today feels less severe.  The patient is very clear that her symptoms are much better than they have been in previous episodes.   Past Medical History  Diagnosis Date  . Asthma   . Hypertension   . Diabetes mellitus without complication     There are no active problems to display for this patient.   History reviewed. No pertinent past surgical history.  Current Outpatient Rx  Name  Route  Sig  Dispense  Refill  . albuterol (PROVENTIL HFA;VENTOLIN HFA) 108 (90 BASE) MCG/ACT inhaler      Inhale 4-6 puffs by mouth every 4 hours as needed for wheezing, cough, and/or shortness of breath   1 Inhaler   1   . Fluticasone-Salmeterol (ADVAIR) 500-50 MCG/DOSE AEPB   Inhalation   Inhale 1 puff into the lungs 2 (two) times daily.         Marland Kitchen gabapentin (NEURONTIN) 300 MG capsule   Oral   Take 300 mg by mouth 2 (two) times daily.      0   . glimepiride (AMARYL) 4 MG tablet   Oral   Take  0.5 tablets by mouth 2 (two) times daily.      0   . losartan-hydrochlorothiazide (HYZAAR) 50-12.5 MG per tablet   Oral   Take 1 tablet by mouth daily.      0   . montelukast (SINGULAIR) 10 MG tablet   Oral   Take 1 tablet by mouth at bedtime.      0   . SYMBICORT 160-4.5 MCG/ACT inhaler   Inhalation   Inhale 2 puffs into the lungs every 12 (twelve) hours.           Dispense as written.   . tiotropium (SPIRIVA) 18 MCG inhalation capsule   Inhalation   Place 18 mcg into inhaler and inhale daily.         Marland Kitchen albuterol (PROVENTIL HFA;VENTOLIN HFA) 108 (90 BASE) MCG/ACT inhaler   Inhalation   Inhale 2 puffs into the lungs every 6 (six) hours as needed for wheezing or shortness of breath.   1 Inhaler   2   . predniSONE (DELTASONE) 20 MG tablet   Oral   Take 2 tablets (40 mg total) by mouth daily.   10 tablet   0     Allergies Percocet  No family history on file.  Social History Social History  Substance Use Topics  . Smoking status: Former Games developer  . Smokeless tobacco:  None  . Alcohol Use: No    Review of Systems Constitutional: No fever/chills Eyes: No visual changes. ENT: No sore throat. Cardiovascular: Denies chest pain. Respiratory: Mild shortness of breath, wheezing. Nonproductive cough. no chest pain Gastrointestinal: No abdominal pain.  No nausea, no vomiting.  No diarrhea.  No constipation. Genitourinary: Negative for dysuria. Musculoskeletal: Negative for back pain. Skin: Negative for rash. No swelling of the legs. No recent trauma or hospitalization. She does not take any birth control tablets. Neurological: Negative for headaches, focal weakness or numbness.  10-point ROS otherwise negative.  ____________________________________________   PHYSICAL EXAM:  VITAL SIGNS: ED Triage Vitals  Enc Vitals Group     BP 07/31/15 1057 147/99 mmHg     Pulse Rate 07/31/15 1057 94     Resp 07/31/15 1057 22     Temp 07/31/15 1057 98.3 F (36.8 C)      Temp Source 07/31/15 1057 Oral     SpO2 07/31/15 1057 94 %     Weight 07/31/15 1057 260 lb (117.935 kg)     Height 07/31/15 1057  (1.702 m)     Head Cir --      Peak Flow --      Pain Score --      Pain Loc --      Pain Edu? --      Excl. in GC? --     Constitutional: Alert and oriented. Well appearing with mild increased work of breathing. Eyes: Conjunctivae are normal. PERRL. EOMI. Head: Atraumatic. Nose: No congestion/rhinnorhea. Mouth/Throat: Mucous membranes are moist.  Oropharynx non-erythematous. Neck: No stridor.   Cardiovascular: Normal rate, regular rhythm. Grossly normal heart sounds.  Good peripheral circulation. No JVD. Respiratory: Mild use of accessory muscles, still able to speak in full sentences. No retractions. No rales. Equal lung sounds, but an x-ray wheezing throughout. No stridor. Gastrointestinal: Soft and nontender. No distention. No abdominal bruits. No CVA tenderness. Musculoskeletal: No lower extremity tenderness nor edema.  No joint effusions. No tenderness to palpation of the thighs. No venous cords. Neurologic:  Normal speech and language. No gross focal neurologic deficits are appreciated. No gait instability. Skin:  Skin is warm, dry and intact. No rash noted. Psychiatric: Mood and affect are normal. Speech and behavior are normal.  ____________________________________________   LABS (all labs ordered are listed, but only abnormal results are displayed)  Labs Reviewed - No data to display ____________________________________________  EKG  ED ECG REPORT I, QUALE, MARK, the attending physician, personally viewed and interpreted this ECG.  Date: 07/31/2015 EKG Time: 1310 Rate: 90 Rhythm: normal sinus rhythm QRS Axis: normal Intervals: normal ST/T Wave abnormalities: normal Conduction Disutrbances: none Narrative Interpretation: unremarkable  ____________________________________________  RADIOLOGY     DG Chest Port 1 View  (Final result) Result time: 07/31/15 12:48:24   Final result by Rad Results In Interface (07/31/15 12:48:24)   Narrative:   CLINICAL DATA: Wheezing and shortness of breath for 3 days. Asthma and COPD.  EXAM: PORTABLE CHEST - 1 VIEW  COMPARISON: One-view chest 06/30/2015.  FINDINGS: The heart size is normal. Lungs are clear. Emphysematous changes are again noted. There is no edema or effusion to suggest failure. No focal airspace disease is present. The visualized soft tissues and bony thorax are unremarkable.  IMPRESSION: 1. No acute cardiopulmonary disease or significant interval change.     ____________________________________________   PROCEDURES  Procedure(s) performed: None  Critical Care performed: No  ____________________________________________   INITIAL IMPRESSION / ASSESSMENT AND PLAN /  ED COURSE  Pertinent labs & imaging results that were available during my care of the patient were reviewed by me and considered in my medical decision making (see chart for details).  Patient resents with wheezing for the last 24 hours, some improvement with albuterol at home but states that she feels her symptoms are still worsening. At present appears to have mild increased work of breathing, she is speaking in full sentences. She does have an established history of asthma, and possibly COPD. She has no acute infectious symptoms.  No risk factors for pulmonary mode some. No chest pain. Based on her presentation, this appears most consistent with the patient's history of known asthma. We will continue to treat her with nebulizers in the emergency room, add prednisone. We will observe her closely, they stopped her initial presentation I suspect she is likely able to be discharged if her symptoms improve with nebulizers in the emergency room.  ----------------------------------------- 1:52 PM on 07/31/2015 -----------------------------------------  Patient notified nurse  that she feels ready to leave and much improved. I reevaluated. Her lungs are clear, her respirations are normal speaking in full sentences. Currently satting 95-97% on room air without distress. Have advised careful return precautions, and patient has follow-up with primary care or a plan.  Discharge home. Condition much improved. ____________________________________________   FINAL CLINICAL IMPRESSION(S) / ED DIAGNOSES  Final diagnoses:  Asthma exacerbation, mild      Sharyn Creamer, MD 07/31/15 1353

## 2015-07-31 NOTE — ED Notes (Signed)
Asthma, wheezing.  Says she need steriopids.  Last albuterol about 15 min ago at home

## 2015-07-31 NOTE — Discharge Instructions (Signed)
We believe that your symptoms are caused today by an exacerbation of your asthma.  Please take the prescribed medications and any medications that you have at home.  Follow up with your doctor as recommended.  If you develop any new or worsening symptoms, including but not limited to fever, persistent vomiting, worsening shortness of breath, or other symptoms that concern you, please return to the Emergency Department immediately. ° ° °Asthma °Asthma is a recurring condition in which the airways tighten and narrow. Asthma can make it difficult to breathe. It can cause coughing, wheezing, and shortness of breath. Asthma episodes, also called asthma attacks, range from minor to life-threatening. Asthma cannot be cured, but medicines and lifestyle changes can help control it. °CAUSES °Asthma is believed to be caused by inherited (genetic) and environmental factors, but its exact cause is unknown. Asthma may be triggered by allergens, lung infections, or irritants in the air. Asthma triggers are different for each person. Common triggers include:  °· Animal dander. °· Dust mites. °· Cockroaches. °· Pollen from trees or grass. °· Mold. °· Smoke. °· Air pollutants such as dust, household cleaners, hair sprays, aerosol sprays, paint fumes, strong chemicals, or strong odors. °· Cold air, weather changes, and winds (which increase molds and pollens in the air). °· Strong emotional expressions such as crying or laughing hard. °· Stress. °· Certain medicines (such as aspirin) or types of drugs (such as beta-blockers). °· Sulfites in foods and drinks. Foods and drinks that may contain sulfites include dried fruit, potato chips, and sparkling grape juice. °· Infections or inflammatory conditions such as the flu, a cold, or an inflammation of the nasal membranes (rhinitis). °· Gastroesophageal reflux disease (GERD). °· Exercise or strenuous activity. °SYMPTOMS °Symptoms may occur immediately after asthma is triggered or many  hours later. Symptoms include: °· Wheezing. °· Excessive nighttime or early morning coughing. °· Frequent or severe coughing with a common cold. °· Chest tightness. °· Shortness of breath. °DIAGNOSIS  °The diagnosis of asthma is made by a review of your medical history and a physical exam. Tests may also be performed. These may include: °· Lung function studies. These tests show how much air you breathe in and out. °· Allergy tests. °· Imaging tests such as X-rays. °TREATMENT  °Asthma cannot be cured, but it can usually be controlled. Treatment involves identifying and avoiding your asthma triggers. It also involves medicines. There are 2 classes of medicine used for asthma treatment:  °· Controller medicines. These prevent asthma symptoms from occurring. They are usually taken every day. °· Reliever or rescue medicines. These quickly relieve asthma symptoms. They are used as needed and provide short-term relief. °Your health care provider will help you create an asthma action plan. An asthma action plan is a written plan for managing and treating your asthma attacks. It includes a list of your asthma triggers and how they may be avoided. It also includes information on when medicines should be taken and when their dosage should be changed. An action plan may also involve the use of a device called a peak flow meter. A peak flow meter measures how well the lungs are working. It helps you monitor your condition. °HOME CARE INSTRUCTIONS  °· Take medicines only as directed by your health care provider. Speak with your health care provider if you have questions about how or when to take the medicines. °· Use a peak flow meter as directed by your health care provider. Record and keep track of readings. °·   Understand and use the action plan to help minimize or stop an asthma attack without needing to seek medical care. °· Control your home environment in the following ways to help prevent asthma attacks: °¨ Do not smoke.  Avoid being exposed to secondhand smoke. °¨ Change your heating and air conditioning filter regularly. °¨ Limit your use of fireplaces and wood stoves. °¨ Get rid of pests (such as roaches and mice) and their droppings. °¨ Throw away plants if you see mold on them. °¨ Clean your floors and dust regularly. Use unscented cleaning products. °¨ Try to have someone else vacuum for you regularly. Stay out of rooms while they are being vacuumed and for a short while afterward. If you vacuum, use a dust mask from a hardware store, a double-layered or microfilter vacuum cleaner bag, or a vacuum cleaner with a HEPA filter. °¨ Replace carpet with wood, tile, or vinyl flooring. Carpet can trap dander and dust. °¨ Use allergy-proof pillows, mattress covers, and box spring covers. °¨ Wash bed sheets and blankets every week in hot water and dry them in a dryer. °¨ Use blankets that are made of polyester or cotton. °¨ Clean bathrooms and kitchens with bleach. If possible, have someone repaint the walls in these rooms with mold-resistant paint. Keep out of the rooms that are being cleaned and painted. °¨ Wash hands frequently. °SEEK MEDICAL CARE IF:  °· You have wheezing, shortness of breath, or a cough even if taking medicine to prevent attacks. °· The colored mucus you cough up (sputum) is thicker than usual. °· Your sputum changes from clear or white to yellow, green, gray, or bloody. °· You have any problems that may be related to the medicines you are taking (such as a rash, itching, swelling, or trouble breathing). °· You are using a reliever medicine more than 2-3 times per week. °· Your peak flow is still at 50-79% of your personal best after following your action plan for 1 hour. °· You have a fever. °SEEK IMMEDIATE MEDICAL CARE IF:  °· You seem to be getting worse and are unresponsive to treatment during an asthma attack. °· You are short of breath even at rest. °· You get short of breath when doing very little physical  activity. °· You have difficulty eating, drinking, or talking due to asthma symptoms. °· You develop chest pain. °· You develop a fast heartbeat. °· You have a bluish color to your lips or fingernails. °· You are light-headed, dizzy, or faint. °· Your peak flow is less than 50% of your personal best. °MAKE SURE YOU:  °· Understand these instructions. °· Will watch your condition. °· Will get help right away if you are not doing well or get worse. °Document Released: 11/28/2005 Document Revised: 04/14/2014 Document Reviewed: 06/27/2013 °[employer]® Patient Information ©2015 [employer]. This information is not intended to replace advice given to you by your health care provider. Make sure you discuss any questions you have with your health care provider. ° °

## 2015-08-30 ENCOUNTER — Emergency Department: Payer: Medicare Other

## 2015-08-30 ENCOUNTER — Inpatient Hospital Stay
Admission: EM | Admit: 2015-08-30 | Discharge: 2015-08-31 | DRG: 189 | Disposition: A | Discharge status: Home / Self Care | Payer: Medicare Other | Attending: Internal Medicine | Admitting: Internal Medicine

## 2015-08-30 ENCOUNTER — Encounter: Payer: Self-pay | Admitting: Emergency Medicine

## 2015-08-30 DIAGNOSIS — E119 Type 2 diabetes mellitus without complications: Secondary | ICD-10-CM | POA: Diagnosis present

## 2015-08-30 DIAGNOSIS — Z720 Tobacco use: Secondary | ICD-10-CM

## 2015-08-30 DIAGNOSIS — J189 Pneumonia, unspecified organism: Secondary | ICD-10-CM | POA: Diagnosis present

## 2015-08-30 DIAGNOSIS — Z6841 Body Mass Index (BMI) 40.0 and over, adult: Secondary | ICD-10-CM

## 2015-08-30 DIAGNOSIS — Z9981 Dependence on supplemental oxygen: Secondary | ICD-10-CM | POA: Diagnosis not present

## 2015-08-30 DIAGNOSIS — F17200 Nicotine dependence, unspecified, uncomplicated: Secondary | ICD-10-CM | POA: Diagnosis not present

## 2015-08-30 DIAGNOSIS — Z87891 Personal history of nicotine dependence: Secondary | ICD-10-CM | POA: Diagnosis not present

## 2015-08-30 DIAGNOSIS — R0689 Other abnormalities of breathing: Secondary | ICD-10-CM | POA: Diagnosis not present

## 2015-08-30 DIAGNOSIS — J811 Chronic pulmonary edema: Secondary | ICD-10-CM | POA: Diagnosis present

## 2015-08-30 DIAGNOSIS — F1410 Cocaine abuse, uncomplicated: Secondary | ICD-10-CM | POA: Diagnosis present

## 2015-08-30 DIAGNOSIS — J80 Acute respiratory distress syndrome: Secondary | ICD-10-CM | POA: Diagnosis not present

## 2015-08-30 DIAGNOSIS — J9621 Acute and chronic respiratory failure with hypoxia: Secondary | ICD-10-CM | POA: Diagnosis not present

## 2015-08-30 DIAGNOSIS — J45909 Unspecified asthma, uncomplicated: Secondary | ICD-10-CM | POA: Diagnosis present

## 2015-08-30 DIAGNOSIS — I10 Essential (primary) hypertension: Secondary | ICD-10-CM | POA: Diagnosis present

## 2015-08-30 DIAGNOSIS — R0602 Shortness of breath: Secondary | ICD-10-CM | POA: Diagnosis not present

## 2015-08-30 DIAGNOSIS — Z716 Tobacco abuse counseling: Secondary | ICD-10-CM | POA: Diagnosis not present

## 2015-08-30 DIAGNOSIS — Z886 Allergy status to analgesic agent status: Secondary | ICD-10-CM | POA: Diagnosis not present

## 2015-08-30 DIAGNOSIS — J441 Chronic obstructive pulmonary disease with (acute) exacerbation: Secondary | ICD-10-CM

## 2015-08-30 DIAGNOSIS — J449 Chronic obstructive pulmonary disease, unspecified: Secondary | ICD-10-CM | POA: Diagnosis not present

## 2015-08-30 HISTORY — DX: Acute and chronic respiratory failure with hypoxia: J96.21

## 2015-08-30 HISTORY — DX: Chronic obstructive pulmonary disease, unspecified: J44.9

## 2015-08-30 LAB — RAPID HIV SCREEN (HIV 1/2 AB+AG)
HIV 1/2 ANTIBODIES: NONREACTIVE
HIV-1 P24 ANTIGEN - HIV24: NONREACTIVE

## 2015-08-30 LAB — BASIC METABOLIC PANEL
ANION GAP: 6 (ref 5–15)
BUN: 20 mg/dL (ref 6–20)
CALCIUM: 9.1 mg/dL (ref 8.9–10.3)
CO2: 25 mmol/L (ref 22–32)
CREATININE: 1.69 mg/dL — AB (ref 0.44–1.00)
Chloride: 110 mmol/L (ref 101–111)
GFR, EST AFRICAN AMERICAN: 40 mL/min — AB (ref >60)
GFR, EST NON AFRICAN AMERICAN: 34 mL/min — AB (ref >60)
Glucose, Bld: 158 mg/dL — ABNORMAL HIGH (ref 65–99)
Potassium: 4.4 mmol/L (ref 3.5–5.1)
SODIUM: 141 mmol/L (ref 135–145)

## 2015-08-30 LAB — CBC
HEMATOCRIT: 47.2 % — AB (ref 35.0–47.0)
Hemoglobin: 15.4 g/dL (ref 12.0–16.0)
MCH: 29.5 pg (ref 26.0–34.0)
MCHC: 32.7 g/dL (ref 32.0–36.0)
MCV: 90.2 fL (ref 80.0–100.0)
Platelets: 361 10*3/uL (ref 150–440)
RBC: 5.23 MIL/uL — ABNORMAL HIGH (ref 3.80–5.20)
RDW: 14.6 % — AB (ref 11.5–14.5)
WBC: 10 10*3/uL (ref 3.6–11.0)

## 2015-08-30 LAB — GLUCOSE, CAPILLARY
GLUCOSE-CAPILLARY: 278 mg/dL — AB (ref 65–99)
Glucose-Capillary: 222 mg/dL — ABNORMAL HIGH (ref 65–99)

## 2015-08-30 LAB — URINE DRUG SCREEN, QUALITATIVE (ARMC ONLY)
Amphetamines, Ur Screen: NOT DETECTED
BARBITURATES, UR SCREEN: NOT DETECTED
Benzodiazepine, Ur Scrn: NOT DETECTED
CANNABINOID 50 NG, UR ~~LOC~~: NOT DETECTED
Cocaine Metabolite,Ur ~~LOC~~: POSITIVE — AB
MDMA (Ecstasy)Ur Screen: NOT DETECTED
Methadone Scn, Ur: NOT DETECTED
Opiate, Ur Screen: POSITIVE — AB
PHENCYCLIDINE (PCP) UR S: NOT DETECTED
TRICYCLIC, UR SCREEN: NOT DETECTED

## 2015-08-30 LAB — BRAIN NATRIURETIC PEPTIDE: B NATRIURETIC PEPTIDE 5: 23 pg/mL (ref 0.0–100.0)

## 2015-08-30 LAB — TROPONIN I

## 2015-08-30 LAB — MRSA PCR SCREENING: MRSA by PCR: NEGATIVE

## 2015-08-30 MED ORDER — ALBUTEROL SULFATE (2.5 MG/3ML) 0.083% IN NEBU
INHALATION_SOLUTION | RESPIRATORY_TRACT | Status: AC
  Administered 2015-08-30: 7.5 mg via RESPIRATORY_TRACT
  Filled 2015-08-30: qty 9

## 2015-08-30 MED ORDER — BUDESONIDE 0.5 MG/2ML IN SUSP
0.5000 mg | Freq: Two times a day (BID) | RESPIRATORY_TRACT | Status: DC
  Administered 2015-08-30 – 2015-08-31 (×3): 0.5 mg via RESPIRATORY_TRACT
  Filled 2015-08-30 (×3): qty 2

## 2015-08-30 MED ORDER — VECURONIUM BROMIDE 10 MG IV SOLR
INTRAVENOUS | Status: AC
  Filled 2015-08-30: qty 10

## 2015-08-30 MED ORDER — HYDROCHLOROTHIAZIDE 12.5 MG PO CAPS
12.5000 mg | ORAL_CAPSULE | Freq: Every day | ORAL | Status: DC
  Administered 2015-08-30 – 2015-08-31 (×2): 12.5 mg via ORAL
  Filled 2015-08-30 (×2): qty 1

## 2015-08-30 MED ORDER — ENOXAPARIN SODIUM 40 MG/0.4ML ~~LOC~~ SOLN
40.0000 mg | SUBCUTANEOUS | Status: DC
  Administered 2015-08-30: 40 mg via SUBCUTANEOUS
  Filled 2015-08-30: qty 0.4

## 2015-08-30 MED ORDER — ANTISEPTIC ORAL RINSE SOLUTION (CORINZ)
7.0000 mL | Freq: Four times a day (QID) | OROMUCOSAL | Status: DC
  Administered 2015-08-30 – 2015-08-31 (×3): 7 mL via OROMUCOSAL
  Filled 2015-08-30 (×7): qty 7

## 2015-08-30 MED ORDER — METHYLPREDNISOLONE SODIUM SUCC 40 MG IJ SOLR
40.0000 mg | Freq: Two times a day (BID) | INTRAMUSCULAR | Status: DC
  Administered 2015-08-30 – 2015-08-31 (×2): 40 mg via INTRAVENOUS
  Filled 2015-08-30 (×2): qty 1

## 2015-08-30 MED ORDER — MIDAZOLAM HCL 2 MG/2ML IJ SOLN
INTRAMUSCULAR | Status: AC
  Filled 2015-08-30: qty 2

## 2015-08-30 MED ORDER — FENTANYL CITRATE (PF) 100 MCG/2ML IJ SOLN
INTRAMUSCULAR | Status: AC
  Filled 2015-08-30: qty 2

## 2015-08-30 MED ORDER — LOSARTAN POTASSIUM 50 MG PO TABS
50.0000 mg | ORAL_TABLET | Freq: Every day | ORAL | Status: DC
  Administered 2015-08-30 – 2015-08-31 (×2): 50 mg via ORAL
  Filled 2015-08-30 (×2): qty 1

## 2015-08-30 MED ORDER — LORAZEPAM 2 MG/ML IJ SOLN
INTRAMUSCULAR | Status: AC
  Administered 2015-08-30: 1 mg via INTRAVENOUS
  Filled 2015-08-30: qty 1

## 2015-08-30 MED ORDER — DEXTROSE 5 % IV SOLN
500.0000 mg | Freq: Once | INTRAVENOUS | Status: AC
  Administered 2015-08-30: 500 mg via INTRAVENOUS
  Filled 2015-08-30: qty 500

## 2015-08-30 MED ORDER — ONDANSETRON HCL 4 MG PO TABS: 4.0000 mg | ORAL_TABLET | Freq: Four times a day (QID) | ORAL | Status: DC | PRN

## 2015-08-30 MED ORDER — ENOXAPARIN SODIUM 40 MG/0.4ML ~~LOC~~ SOLN
40.0000 mg | Freq: Two times a day (BID) | SUBCUTANEOUS | Status: DC
  Administered 2015-08-30 – 2015-08-31 (×2): 40 mg via SUBCUTANEOUS
  Filled 2015-08-30 (×2): qty 0.4

## 2015-08-30 MED ORDER — IPRATROPIUM-ALBUTEROL 0.5-2.5 (3) MG/3ML IN SOLN
RESPIRATORY_TRACT | Status: AC
  Administered 2015-08-30: 3 mL
  Filled 2015-08-30: qty 12

## 2015-08-30 MED ORDER — GABAPENTIN 300 MG PO CAPS
300.0000 mg | ORAL_CAPSULE | Freq: Two times a day (BID) | ORAL | Status: DC
  Administered 2015-08-30 – 2015-08-31 (×3): 300 mg via ORAL
  Filled 2015-08-30 (×3): qty 1

## 2015-08-30 MED ORDER — AZITHROMYCIN 250 MG PO TABS: 250.0000 mg | ORAL_TABLET | Freq: Every day | ORAL | Status: DC

## 2015-08-30 MED ORDER — IPRATROPIUM-ALBUTEROL 0.5-2.5 (3) MG/3ML IN SOLN: 3.0000 mL | RESPIRATORY_TRACT | Status: DC

## 2015-08-30 MED ORDER — METHYLPREDNISOLONE SODIUM SUCC 125 MG IJ SOLR: 60.0000 mg | Freq: Three times a day (TID) | INTRAMUSCULAR | Status: DC

## 2015-08-30 MED ORDER — LORAZEPAM 2 MG/ML IJ SOLN
1.0000 mg | Freq: Once | INTRAMUSCULAR | Status: AC
  Administered 2015-08-30: 1 mg via INTRAVENOUS

## 2015-08-30 MED ORDER — ONDANSETRON HCL 4 MG/2ML IJ SOLN: 4.0000 mg | Freq: Four times a day (QID) | INTRAMUSCULAR | Status: DC | PRN

## 2015-08-30 MED ORDER — LOSARTAN POTASSIUM-HCTZ 50-12.5 MG PO TABS: 1.0000 | ORAL_TABLET | Freq: Every day | ORAL | Status: DC

## 2015-08-30 MED ORDER — BUDESONIDE-FORMOTEROL FUMARATE 160-4.5 MCG/ACT IN AERO
2.0000 | INHALATION_SPRAY | Freq: Two times a day (BID) | RESPIRATORY_TRACT | Status: DC
  Administered 2015-08-30 – 2015-08-31 (×2): 2 via RESPIRATORY_TRACT
  Filled 2015-08-30: qty 6

## 2015-08-30 MED ORDER — HYDRALAZINE HCL 25 MG PO TABS
25.0000 mg | ORAL_TABLET | Freq: Once | ORAL | Status: AC
  Administered 2015-08-30: 25 mg via ORAL
  Filled 2015-08-30: qty 1

## 2015-08-30 MED ORDER — ALBUTEROL SULFATE (2.5 MG/3ML) 0.083% IN NEBU
7.5000 mg | INHALATION_SOLUTION | Freq: Once | RESPIRATORY_TRACT | Status: AC
  Administered 2015-08-30: 7.5 mg via RESPIRATORY_TRACT

## 2015-08-30 MED ORDER — IPRATROPIUM-ALBUTEROL 0.5-2.5 (3) MG/3ML IN SOLN: 3.0000 mL | Freq: Four times a day (QID) | RESPIRATORY_TRACT | Status: DC

## 2015-08-30 MED ORDER — ALBUTEROL SULFATE (2.5 MG/3ML) 0.083% IN NEBU
2.5000 mg | INHALATION_SOLUTION | RESPIRATORY_TRACT | Status: DC
  Administered 2015-08-30 – 2015-08-31 (×7): 2.5 mg via RESPIRATORY_TRACT
  Filled 2015-08-30 (×7): qty 3

## 2015-08-30 MED ORDER — MONTELUKAST SODIUM 10 MG PO TABS
10.0000 mg | ORAL_TABLET | Freq: Every day | ORAL | Status: DC
  Administered 2015-08-30: 10 mg via ORAL
  Filled 2015-08-30: qty 1

## 2015-08-30 MED ORDER — METHYLPREDNISOLONE SODIUM SUCC 40 MG IJ SOLR: 40.0000 mg | Freq: Two times a day (BID) | INTRAMUSCULAR | Status: DC

## 2015-08-30 MED ORDER — TIOTROPIUM BROMIDE MONOHYDRATE 18 MCG IN CAPS
18.0000 ug | ORAL_CAPSULE | Freq: Every day | RESPIRATORY_TRACT | Status: DC
  Administered 2015-08-30 – 2015-08-31 (×2): 18 ug via RESPIRATORY_TRACT
  Filled 2015-08-30: qty 5

## 2015-08-30 MED ORDER — MORPHINE SULFATE (PF) 2 MG/ML IV SOLN
1.0000 mg | Freq: Once | INTRAVENOUS | Status: AC
  Administered 2015-08-30: 1 mg via INTRAVENOUS
  Filled 2015-08-30: qty 1

## 2015-08-30 MED ORDER — INSULIN ASPART 100 UNIT/ML ~~LOC~~ SOLN
0.0000 [IU] | Freq: Three times a day (TID) | SUBCUTANEOUS | Status: DC
  Administered 2015-08-30: 8 [IU] via SUBCUTANEOUS
  Administered 2015-08-30: 3 [IU] via SUBCUTANEOUS
  Administered 2015-08-31: 5 [IU] via SUBCUTANEOUS
  Administered 2015-08-31: 3 [IU] via SUBCUTANEOUS
  Filled 2015-08-30: qty 8
  Filled 2015-08-30: qty 3
  Filled 2015-08-30: qty 5
  Filled 2015-08-30: qty 3

## 2015-08-30 MED ORDER — AZITHROMYCIN 250 MG PO TABS
500.0000 mg | ORAL_TABLET | Freq: Every day | ORAL | Status: AC
  Administered 2015-08-31: 500 mg via ORAL
  Filled 2015-08-30: qty 2

## 2015-08-30 MED ORDER — GLIMEPIRIDE 2 MG PO TABS
4.0000 mg | ORAL_TABLET | Freq: Two times a day (BID) | ORAL | Status: DC
  Administered 2015-08-30 – 2015-08-31 (×2): 4 mg via ORAL
  Filled 2015-08-30: qty 1
  Filled 2015-08-30 (×2): qty 2
  Filled 2015-08-30: qty 1

## 2015-08-30 MED ORDER — AZITHROMYCIN 250 MG PO TABS
250.0000 mg | ORAL_TABLET | Freq: Every day | ORAL | Status: DC
Start: 2015-08-31 — End: 2015-08-30

## 2015-08-30 MED ORDER — IPRATROPIUM-ALBUTEROL 0.5-2.5 (3) MG/3ML IN SOLN
10.0000 mL | Freq: Once | RESPIRATORY_TRACT | Status: AC
  Administered 2015-08-30: 10 mL via RESPIRATORY_TRACT

## 2015-08-30 MED ORDER — MORPHINE SULFATE (CONCENTRATE) 10 MG/0.5ML PO SOLN
10.0000 mg | ORAL | Status: DC | PRN
  Administered 2015-08-30: 10 mg via ORAL
  Filled 2015-08-30: qty 1

## 2015-08-30 MED ORDER — SENNOSIDES-DOCUSATE SODIUM 8.6-50 MG PO TABS: 1.0000 | ORAL_TABLET | Freq: Every evening | ORAL | Status: DC | PRN

## 2015-08-30 NOTE — Progress Notes (Signed)
Pts bp trending up systolic bp currently 150's per Dr. Prudencio Pair telephone orders placed order to administer 25 mg po hydralazine once

## 2015-08-30 NOTE — ED Notes (Signed)
Patient reports she is still feeling better.  Removed Bipap briefly for patient to get a drink of gingerale.  Patient still has labored breathing with accessory muscle use.  Expiratory wheezing noted as well.  50% Oxygen on Bipap.  Pt tolerating well.  Attempted to call daughter per patient's request but there was no answer and she did not want me to leave a message.  Admitting MD in room at this time.  Patient does state she has home oxygen she uses 2L at night. She is a smoker about 1/2 PPD.

## 2015-08-30 NOTE — Progress Notes (Signed)
Anticoagulation monitoring(Lovenox):  49yo  ordered Lovenox 40 mg Q24h  Filed Weights   08/30/15 0710  Weight: 260 lb (117.935 kg)   Body mass index is 40.71 kg/(m^2).  Lab Results  Component Value Date   CREATININE 1.69* 08/30/2015   CREATININE 1.60* 06/30/2015   CREATININE 1.41* 06/16/2015   Estimated Creatinine Clearance: 52.9 mL/min (by C-G formula based on Cr of 1.69). Hemoglobin & Hematocrit     Component Value Date/Time   HGB 15.4 08/30/2015 0716   HGB 14.4 03/10/2015 0405   HCT 47.2* 08/30/2015 0716   HCT 45.2 03/10/2015 0405     Per Protocol for Patient with estCrcl> 30 ml/min and BMI >40, will transition to Lovenox 40 mg Q12h.     Bari Mantis PharmD Clinical Pharmacist 08/30/2015 4:19 PM

## 2015-08-30 NOTE — Consult Note (Signed)
[facility] Consultation      Name: TORRANCE FRECH MRN: [id number] DOB: May 23, 1966    ADMISSION DATE:  08/30/2015    CHIEF COMPLAINT:  Acute SOB and wheezing  HISTORY OF PRESENT ILLNESS   49 yo obese AAF with  H/o COPD and chronic tobacco abuse admitted to ICU increased WOB and using ing accessory muscles to breathe with acute wheezing, patient has been struggling to breathe for several days  Patient has productive cough as well, patient supposedly on chronic oxygen, patient could NOT perform complete ROS due to resp distress  Patient at high risk for cardiac arrest/intubation Patient took several rounds of nebulizer at home could not breathe any better came to the emergency room via EMS was placed on BiPAP and Route and in the emergency room. She got 3 nebulizer treatments. She is being admitted to intensive care unit for acute on chronic hypoxic respiratory failure secondary to COPD exacerbation.  Denies any fever. Chest x-ray does not show any evidence of pneumonia. She received IV Zithromax in the emergency room   SIGNIFICANT EVENTS   9/18 ICU admission  PAST MEDICAL HISTORY    :  Past Medical History  Diagnosis Date  . Asthma   . Hypertension   . Diabetes mellitus without complication    History reviewed. No pertinent past surgical history. Prior to Admission medications   Medication Sig Start Date End Date Taking? Authorizing Provider  albuterol (PROVENTIL HFA;VENTOLIN HFA) 108 (90 BASE) MCG/ACT inhaler Inhale 4-6 puffs by mouth every 4 hours as needed for wheezing, cough, and/or shortness of breath 06/16/15  Yes Loleta Rose, MD  Fluticasone-Salmeterol (ADVAIR) 500-50 MCG/DOSE AEPB Inhale 1 puff into the lungs 2 (two) times daily.   Yes Historical Provider, MD  gabapentin (NEURONTIN) 300 MG capsule Take 300 mg by mouth 2 (two) times daily.   Yes Historical Provider, MD  glimepiride (AMARYL) 4 MG tablet Take 4 mg by mouth 2 (two) times daily.     Yes Historical Provider, MD  losartan-hydrochlorothiazide (HYZAAR) 50-12.5 MG per tablet Take 1 tablet by mouth daily.   Yes Historical Provider, MD  montelukast (SINGULAIR) 10 MG tablet Take 1 tablet by mouth at bedtime.   Yes Historical Provider, MD  SYMBICORT 160-4.5 MCG/ACT inhaler Inhale 2 puffs into the lungs every 12 (twelve) hours.   Yes Historical Provider, MD  tiotropium (SPIRIVA) 18 MCG inhalation capsule Place 18 mcg into inhaler and inhale daily.   Yes Historical Provider, MD  albuterol (PROVENTIL HFA;VENTOLIN HFA) 108 (90 BASE) MCG/ACT inhaler Inhale 2 puffs into the lungs every 6 (six) hours as needed for wheezing or shortness of breath. 07/31/15   Sharyn Creamer, MD  predniSONE (DELTASONE) 20 MG tablet Take 2 tablets (40 mg total) by mouth daily. 07/31/15   Sharyn Creamer, MD   Allergies  Allergen Reactions  . Percocet [Oxycodone-Acetaminophen] Itching     FAMILY HISTORY   History reviewed. No pertinent family history.    SOCIAL HISTORY    reports that she has quit smoking. She does not have any smokeless tobacco history on file. She reports that she does not drink alcohol. Her drug history is not on file.  Review of Systems  Unable to perform ROS: critical illness      VITAL SIGNS    Temp:  [97.6 F (36.4 C)-98.3 F (36.8 C)] 97.6 F (36.4 C) (09/18 1100) Pulse Rate:  [105-122] 109 (09/18 1100) Resp:  [20-32] 23 (09/18 1100) BP: (138-172)/(79-133) 148/94 mmHg (09/18 1100)  SpO2:  [81 %-100 %] 99 % (09/18 1100) FiO2 (%):  [40 %-50 %] 40 % (09/18 1100) Weight:  [260 lb (117.935 kg)] 260 lb (117.935 kg) (09/18 0710) HEMODYNAMICS:   VENTILATOR SETTINGS: Vent Mode:  [-]  FiO2 (%):  [40 %-50 %] 40 % INTAKE / OUTPUT: No intake or output data in the 24 hours ending 08/30/15 1103     PHYSICAL EXAM   Physical Exam  Constitutional: She is oriented to person, place, and time. She appears well-developed and well-nourished. She appears distressed.  HENT:  Head:  Normocephalic and atraumatic.  Eyes: Conjunctivae and EOM are normal. Pupils are equal, round, and reactive to light. No scleral icterus.  Neck: Normal range of motion. Neck supple.  Cardiovascular: Normal rate and regular rhythm.   No murmur heard. Pulmonary/Chest: She is in respiratory distress. She has wheezes. She has rales.  resp distress  Abdominal: Soft. She exhibits no distension. There is no tenderness.  Musculoskeletal: She exhibits no edema.  Neurological: She is alert and oriented to person, place, and time. She displays normal reflexes. No cranial nerve deficit. Coordination normal.     Skin: Skin is warm. No rash noted. She is diaphoretic.       LABS   LABS:  CBC  Recent Labs Lab 08/30/15 0716  WBC 10.0  HGB 15.4  HCT 47.2*  PLT 361   Coag's No results for input(s): APTT, INR in the last 168 hours. BMET  Recent Labs Lab 08/30/15 0716  NA 141  K 4.4  CL 110  CO2 25  BUN 20  CREATININE 1.69*  GLUCOSE 158*   Electrolytes  Recent Labs Lab 08/30/15 0716  CALCIUM 9.1   Sepsis Markers No results for input(s): LATICACIDVEN, PROCALCITON, O2SATVEN in the last 168 hours. ABG No results for input(s): PHART, PCO2ART, PO2ART in the last 168 hours. Liver Enzymes No results for input(s): AST, ALT, ALKPHOS, BILITOT, ALBUMIN in the last 168 hours. Cardiac Enzymes  Recent Labs Lab 08/30/15 0716  TROPONINI <0.03   Glucose No results for input(s): GLUCAP in the last 168 hours.   No results found for this or any previous visit (from the past 240 hour(s)).   Current facility-administered medications:  .  albuterol (PROVENTIL) (2.5 MG/3ML) 0.083% nebulizer solution 2.5 mg, 2.5 mg, Nebulization, Q4H, Erin Fulling, MD .  Melene Muller ON 08/31/2015] azithromycin (ZITHROMAX) tablet 500 mg, 500 mg, Oral, Daily **FOLLOWED BY** [START ON 09/02/2015] azithromycin (ZITHROMAX) tablet 250 mg, 250 mg, Oral, Daily, Enedina Finner, MD .  budesonide (PULMICORT) nebulizer  solution 0.5 mg, 0.5 mg, Nebulization, BID, Erin Fulling, MD .  budesonide-formoterol (SYMBICORT) 160-4.5 MCG/ACT inhaler 2 puff, 2 puff, Inhalation, Q12H, Enedina Finner, MD, 2 puff at 08/30/15 1100 .  fentaNYL (SUBLIMAZE) 100 MCG/2ML injection, , , ,  .  gabapentin (NEURONTIN) capsule 300 mg, 300 mg, Oral, BID, Enedina Finner, MD, 300 mg at 08/30/15 1100 .  glimepiride (AMARYL) tablet 4 mg, 4 mg, Oral, BID WC, Enedina Finner, MD .  losartan (COZAAR) tablet 50 mg, 50 mg, Oral, Daily **AND** hydrochlorothiazide (MICROZIDE) capsule 12.5 mg, 12.5 mg, Oral, Daily, Enedina Finner, MD .  insulin aspart (novoLOG) injection 0-15 Units, 0-15 Units, Subcutaneous, TID WC, Enedina Finner, MD .  ipratropium-albuterol (DUONEB) 0.5-2.5 (3) MG/3ML nebulizer solution, , , ,  .  methylPREDNISolone sodium succinate (SOLU-MEDROL) 40 mg/mL injection 40 mg, 40 mg, Intravenous, Q12H, Erin Fulling, MD .  midazolam (VERSED) 2 MG/2ML injection, , , ,  .  montelukast (SINGULAIR) tablet 10  mg, 10 mg, Oral, QHS, Enedina Finner, MD .  tiotropium [facility]) inhalation capsule 18 mcg, 18 mcg, Inhalation, Daily, Enedina Finner, MD, 18 mcg at 08/30/15 1059 .  vecuronium (NORCURON) 10 MG injection, , , ,   IMAGING    Dg Chest Port 1 View  08/30/2015   CLINICAL DATA:  Acute onset respiratory distress and shortness of breath, current history of COPD  EXAM: PORTABLE CHEST - 1 VIEW  COMPARISON:  07/31/2015  FINDINGS: Stable hyperinflation. Lungs clear. Heart size and vascular pattern normal.  IMPRESSION: Evidence of COPD with no acute findings   Electronically Signed   By: Esperanza Heir M.D.   On: 08/30/2015 07:18      MAJOR EVENTS/TEST RESULTS: CXR flattened diaphragms, no overt opacities noted   INDWELLING DEVICES::  MICRO DATA: MRSA PCR >> Urine  Blood Resp   ANTIMICROBIALS:  Azithromyin>>9/18    ASSESSMENT/PLAN   49 yo AAF with acute on chronic resp failure from acute COPD exacerbation likely from ongoing tobacco abuse and  bronchitis/atypical pneumonia   PULMONARY-high risk for intubation -Will give continuous neb therapy -start solumedrol 40 mg bid -continue current abx  -alb nebs every 4 hrs -symbicort/spiriva -pulmicort nebs -biPAP as tolerated -oral roxanol for increased WOB -oxygen as needed -check HIV status and UDS   CARDIOVASCULAR Needs ICU monitoring  RENAL Follow chem 7, follow uo  GASTROINTESTINAL Keep NPO for now  HEMATOLOGIC folow cbc  INFECTIOUS Atypical pneumonia -contineu current abx -obtain sputum cultures  ENDOCRINE Follow fsbs  NEUROLOGIC -alert and awake, avoid excessive sedatives    I have personally obtained a history, examined the patient, evaluated laboratory and independently reviewed  imaging results, formulated the assessment and plan and placed orders.  The Patient requires high complexity decision making for assessment and support, frequent evaluation and titration of therapies, application of advanced monitoring technologies and extensive interpretation of multiple databases. Critical Care Time devoted to patient care services described in this note is 45 minutes.   Overall, patient is critically ill, prognosis is guarded. Patient at high risk for cardiac arrest and death.    Lucie Leather, M.D.  [facility]  Medical Director [facility] Medical Director [facility] Cardio-Pulmonary Department

## 2015-08-30 NOTE — ED Provider Notes (Signed)
[facility] Emergency Department [place] Note REMINDER - THIS NOTE IS NOT A FINAL MEDICAL RECORD UNTIL IT IS SIGNED. UNTIL THEN, THE CONTENT BELOW MAY REFLECT INFORMATION FROM A DOCUMENTATION TEMPLATE, NOT THE ACTUAL PATIENT VISIT. ____________________________________________  Time seen: Approximately 6:55 AM  I have reviewed the triage vital signs and the nursing notes.   HISTORY  Chief Complaint Respiratory Distress  EM caveat: Due to patient's severe dyspnea and need for BiPAP therapy unable to provide a full review of systems, limits the examination, and history.  HPI Samantha [name] is a 49 y.o. female history of COPD and asthma. She reports for the last 1 week she has had increasing shortness of breath. Today her symptoms became severe, she reports she took 7 DuoNeb prior to arrival this evening and continues of increasing shortness of breath.  Patient denies chest pain. Denies fever. She has had a nonproductive cough.     Past Medical History  Diagnosis Date  . Asthma   . Hypertension   . Diabetes mellitus without complication     There are no active problems to display for this patient.   No past surgical history on file.  Current Outpatient Rx  Name  Route  Sig  Dispense  Refill  . albuterol (PROVENTIL HFA;VENTOLIN HFA) 108 (90 BASE) MCG/ACT inhaler      Inhale 4-6 puffs by mouth every 4 hours as needed for wheezing, cough, and/or shortness of breath   1 Inhaler   1   . albuterol (PROVENTIL HFA;VENTOLIN HFA) 108 (90 BASE) MCG/ACT inhaler   Inhalation   Inhale 2 puffs into the lungs every 6 (six) hours as needed for wheezing or shortness of breath.   1 Inhaler   2   . Fluticasone-Salmeterol (ADVAIR) 500-50 MCG/DOSE AEPB   Inhalation   Inhale 1 puff into the lungs 2 (two) times daily.         Marland Kitchen gabapentin (NEURONTIN) 300 MG capsule   Oral   Take 300 mg by mouth 2 (two) times daily.      0   . glimepiride (AMARYL) 4 MG  tablet   Oral   Take 0.5 tablets by mouth 2 (two) times daily.      0   . losartan-hydrochlorothiazide (HYZAAR) 50-12.5 MG per tablet   Oral   Take 1 tablet by mouth daily.      0   . montelukast (SINGULAIR) 10 MG tablet   Oral   Take 1 tablet by mouth at bedtime.      0   . predniSONE (DELTASONE) 20 MG tablet   Oral   Take 2 tablets (40 mg total) by mouth daily.   10 tablet   0   . SYMBICORT 160-4.5 MCG/ACT inhaler   Inhalation   Inhale 2 puffs into the lungs every 12 (twelve) hours.           Dispense as written.   . tiotropium (SPIRIVA) 18 MCG inhalation capsule   Inhalation   Place 18 mcg into inhaler and inhale daily.           Allergies Percocet  No family history on file.  Social History Social History  Substance Use Topics  . Smoking status: Former Games developer  . Smokeless tobacco: Not on file  . Alcohol Use: No    Review of Systems Constitutional: No fever/chills Cardiovascular: Denies chest pain. Respiratory: See history of present illness Gastrointestinal: No abdominal pain.     ____________________________________________   PHYSICAL EXAM:  VITAL SIGNS: ED Triage Vitals  Enc Vitals Group     BP --      Pulse --      Resp --      Temp --      Temp src --      SpO2 08/30/15 0652 99 %     Weight --      Height --      Head Cir --      Peak Flow --      Pain Score --      Pain Loc --      Pain Edu? --      Excl. in GC? --    Constitutional: Alert and oriented. Patient appears in moderate to severe respiratory distress in tripod position. Eyes: Conjunctivae are normal. PERRL. EOMI. Head: Atraumatic. Nose: No congestion/rhinnorhea. Mouth/Throat: Mucous membranes are moist.  Oropharynx non-erythematous. Neck: No stridor.   Cardiovascular: Tachycardic rate, regular rhythm. Grossly normal heart sounds.  Respiratory: Patient moderate to severe respiratory distress using accessory muscles in tripod position. There is diffuse wheezing  on expiration throughout, patient unable to speak in one 1-2 words. Gastrointestinal: Soft and nontender. No distention. No abdominal bruits. No CVA tenderness. Musculoskeletal: No lower extremity tenderness nor edema.  Neurologic:   No gross focal neurologic deficits are appreciated. Skin:  Skin is warm, dry and intact. No rash noted. Psychiatric: Mood and affect are very anxious. Patient is maximally alert, very anxious appearing.  ____________________________________________   LABS (all labs ordered are listed, but only abnormal results are displayed)  Labs Reviewed  CBC  BASIC METABOLIC PANEL  TROPONIN I  BRAIN NATRIURETIC PEPTIDE   ____________________________________________  EKG  Reviewed and interpreted by me Times 7 AM Sinus tachycardia at 120 bpm QTc 450 QRS 70 There is artifact present, no gross ST abnormality is appreciated Reviewed and interpreted as sinus tachycardia without acute abnormality except as prohibited by artifact ____________________________________________  RADIOLOGY  DG Chest Port 1 View (Final result) Result time: 08/30/15 07:18:16   Final result by Rad Results In Interface (08/30/15 07:18:16)   Narrative:   CLINICAL DATA: Acute onset respiratory distress and shortness of breath, current history of COPD  EXAM: PORTABLE CHEST - 1 VIEW  COMPARISON: 07/31/2015  FINDINGS: Stable hyperinflation. Lungs clear. Heart size and vascular pattern normal.  IMPRESSION: Evidence of COPD with no acute findings    Personally viewed by me at the bedside. ____________________________________________   PROCEDURES  Procedure(s) performed: None  Critical Care performed: Yes, see critical care note(s)  CRITICAL CARE Performed by: Sharyn Creamer   Total critical care time: 35  Critical care time was exclusive of separately billable procedures and treating other patients.  Critical care was necessary to treat or prevent imminent or  life-threatening deterioration.  Critical care was time spent personally by me on the following activities: development of treatment plan with patient and/or surrogate as well as nursing, discussions with consultants, evaluation of patient's response to treatment, examination of patient, obtaining history from patient or surrogate, ordering and performing treatments and interventions, ordering and review of laboratory studies, ordering and review of radiographic studies, pulse oximetry and re-evaluation of patient's condition.  ____________________________________________   INITIAL IMPRESSION / ASSESSMENT AND PLAN / ED COURSE  Pertinent labs & imaging results that were available during my care of the patient were reviewed by me and considered in my medical decision making (see chart for details).  Patient presents with severe respiratory distress with audible and severe wheezing. Tripoding.  Differential diagnosis most suggestive of COPD or asthma but other considerations including pneumothorax, congestive heart failure, ACS are all considered. We will continue the patient on albuterol and initiate BiPAP. I'll add azithromycin given the history of COPD. In addition, the patient received 125 mg slight Medrol prior to arrival with EMS.  ----------------------------------------- 7:24 AM on 08/30/2015 -----------------------------------------  The patient is resting much more comfortably tolerating BiPAP well. She is awake and alert reports that she feels much better. Appears that improvement in breathing and some Ativan has improved her anxiety significantly. She does have end expiratory wheezing throughout fields, but is taking excellent volume much improved on BiPAP.  Ongoing care and disposition assigned to Dr. Carollee Massed. Plan of care is to follow-up on the patient's lab results, continue to monitor her and treat closely for respiratory distress. The patient will clearly require  admission. ____________________________________________   FINAL CLINICAL IMPRESSION(S) / ED DIAGNOSES  Final diagnoses:  COPD with exacerbation      Sharyn Creamer, MD 08/30/15 [phone]

## 2015-08-30 NOTE — Progress Notes (Signed)
Transported Pt from ED Room 2 to ICU Room 13 on Bipap (V-60). Pt stable throughout with no complications. FiO2 decreased to 40% upon arrival to ICU. Pt spo2 100%. Report given to assigned RT. RT will continue to monitor.

## 2015-08-30 NOTE — H&P (Signed)
[facility] [place] [facility]   PATIENT NAME: Samantha [name]    MR#:  [id number]  DATE OF BIRTH:  [birth year]  DATE OF ADMISSION:  08/30/2015  PRIMARY CARE PHYSICIAN: [facility]   REQUESTING/REFERRING PHYSICIAN: Dr Carollee Massed  CHIEF COMPLAINT:   Shortness of breath. HISTORY OF PRESENT ILLNESS:  Samantha [name]  is a 49 y.o. female with a known history of chronic respiratory failure secondary to COPD on home oxygen 2 L/m, ongoing tobacco abuse, hypertension, type 2 diabetes, morbid opacity comes to the emergency room with increasing shortness of breath audible wheezing and chest tightness since yesterday. Patient took several rounds of nebulizer at home could not breathe any better came to the emergency room via EMS was placed on BiPAP and Route and in the emergency room. Patient currently is able to complete all her sentences but getting getting very short of breath. She received 125 mg of IV Solu-Medrol in the emergency room. She got 3 nebulizer treatments. She is being admitted to intensive care unit for acute on chronic hypoxic respiratory failure secondary to COPD exacerbation. Patient has chronic smoker's cough on. She does have some phlegm daily. Nothing unusual at present. Denies any fever. Chest x-ray does not show any evidence of pneumonia. She received IV Zithromax in the emergency room.  PAST MEDICAL HISTORY:   Past Medical History  Diagnosis Date  . Asthma   . Hypertension   . Diabetes mellitus without complication     PAST SURGICAL HISTOIRY:  History reviewed. No pertinent past surgical history.  SOCIAL HISTORY:   Social History  Substance Use Topics  . Smoking status: Former Games developer  . Smokeless tobacco: Not on file  . Alcohol Use: No    FAMILY HISTORY:  History reviewed. No pertinent family history.  DRUG ALLERGIES:   Allergies  Allergen Reactions  . Percocet [Oxycodone-Acetaminophen] Itching    REVIEW OF  SYSTEMS:  Review of Systems  Constitutional: Negative for fever, chills and diaphoresis.  HENT: Negative for congestion, ear pain, hearing loss, nosebleeds and sore throat.   Eyes: Negative for blurred vision, double vision, photophobia and pain.  Respiratory: Positive for cough, sputum production and shortness of breath. Negative for hemoptysis, wheezing and stridor.   Cardiovascular: Negative for orthopnea, claudication and leg swelling.  Gastrointestinal: Negative for heartburn and abdominal pain.  Genitourinary: Negative for dysuria and frequency.  Musculoskeletal: Negative for back pain, joint pain and neck pain.  Skin: Negative for rash.  Neurological: Positive for weakness. Negative for tingling, sensory change, speech change, focal weakness, seizures and headaches.  Endo/Heme/Allergies: Does not bruise/bleed easily.  Psychiatric/Behavioral: Negative for memory loss. The patient is not nervous/anxious.   All other systems reviewed and are negative.    MEDICATIONS AT HOME:   Prior to Admission medications   Medication Sig Start Date End Date Taking? Authorizing Provider  albuterol (PROVENTIL HFA;VENTOLIN HFA) 108 (90 BASE) MCG/ACT inhaler Inhale 4-6 puffs by mouth every 4 hours as needed for wheezing, cough, and/or shortness of breath 06/16/15   Loleta Rose, MD  albuterol (PROVENTIL HFA;VENTOLIN HFA) 108 (90 BASE) MCG/ACT inhaler Inhale 2 puffs into the lungs every 6 (six) hours as needed for wheezing or shortness of breath. 07/31/15   Sharyn Creamer, MD  Fluticasone-Salmeterol (ADVAIR) 500-50 MCG/DOSE AEPB Inhale 1 puff into the lungs 2 (two) times daily.    Historical Provider, MD  gabapentin (NEURONTIN) 300 MG capsule Take 300 mg by mouth 2 (two) times daily.    Historical  Provider, MD  glimepiride (AMARYL) 4 MG tablet Take 0.5 tablets by mouth 2 (two) times daily.    Historical Provider, MD  losartan-hydrochlorothiazide (HYZAAR) 50-12.5 MG per tablet Take 1 tablet by mouth daily.     Historical Provider, MD  montelukast (SINGULAIR) 10 MG tablet Take 1 tablet by mouth at bedtime.    Historical Provider, MD  predniSONE (DELTASONE) 20 MG tablet Take 2 tablets (40 mg total) by mouth daily. 07/31/15   Sharyn Creamer, MD  SYMBICORT 160-4.5 MCG/ACT inhaler Inhale 2 puffs into the lungs every 12 (twelve) hours.    Historical Provider, MD  tiotropium (SPIRIVA) 18 MCG inhalation capsule Place 18 mcg into inhaler and inhale daily.    Historical Provider, MD      VITAL SIGNS:  Blood pressure 157/93, pulse 110, temperature 98.3 F (36.8 C), temperature source Axillary, resp. rate 26, height  (1.702 m), weight 117.935 kg (260 lb), SpO2 99 %.  PHYSICAL EXAMINATION:  GENERAL:  49 y.o.-year-old patient lying in the bed with mod acute distress. Morbid obesity EYES: Pupils equal, round, reactive to light and accommodation. No scleral icterus. Extraocular muscles intact.  HEENT: Head atraumatic, normocephalic. Oropharynx and nasopharynx clear.  NECK:  Supple, no jugular venous distention. No thyroid enlargement, no tenderness.  LUNGS: Coarse breath sounds bilaterally, audible wheezing, no rales,rhonchi or crepitation.++ use of accessory muscles of respiration.  CARDIOVASCULAR: S1, S2 normal. No murmurs, rubs, or gallops. Tachycardia++ ABDOMEN: Soft, nontender, nondistended. Bowel sounds present. No organomegaly or mass.  EXTREMITIES: No pedal edema, cyanosis, or clubbing.  NEUROLOGIC: Cranial nerves II through XII are intact. Muscle strength 5/5 in all extremities. Sensation intact. Gait not checked.  PSYCHIATRIC: The patient is alert and oriented x 3.  SKIN: No obvious rash, lesion, or ulcer.   LABORATORY PANEL:   CBC  Recent Labs Lab 08/30/15 0716  WBC 10.0  HGB 15.4  HCT 47.2*  PLT 361   ------------------------------------------------------------------------------------------------------------------  Chemistries   Recent Labs Lab 08/30/15 0716  NA 141  K 4.4  CL 110   CO2 25  GLUCOSE 158*  BUN 20  CREATININE 1.69*  CALCIUM 9.1   ------------------------------------------------------------------------------------------------------------------  Cardiac Enzymes  Recent Labs Lab 08/30/15 0716  TROPONINI <0.03   ------------------------------------------------------------------------------------------------------------------  RADIOLOGY:  Dg Chest Port 1 View  08/30/2015   CLINICAL DATA:  Acute onset respiratory distress and shortness of breath, current history of COPD  EXAM: PORTABLE CHEST - 1 VIEW  COMPARISON:  07/31/2015  FINDINGS: Stable hyperinflation. Lungs clear. Heart size and vascular pattern normal.  IMPRESSION: Evidence of COPD with no acute findings   Electronically Signed   By: Esperanza Heir M.D.   On: 08/30/2015 07:18    EKG:  S tachycardia  IMPRESSION AND PLAN:   Samantha [name] is a 48 year old Caucasian female with history of tobacco abuse, chronic respiratory failure with COPD on 2 L home oxygen and history of diabetes comes to the emergency room with  1. Acute on chronic hypoxic respiratory failure secondary to COPD exacerbation in the setting of tobacco abuse. Patient on BiPAP admit to ICU. IV Solu-Medrol around the clock. Nebulizer every 4 hourly, continue Aspercreme Symbicort, Singulair, Pulmonary consultation Patient is a full code. Discussed about possible mechanical ventilation and intubation if needed she is agreeable to it. Zithromax by mouth Chest x-ray negative for pneumonia.  2. Type 2 diabetes Sliding scale insulin. Carb-controlled diet. Continue home meds.  3. Hypertension Continue home meds  4. Tobacco abuse counseling done. 4 minutes spent. Advised patient  to quit smoking.  5. DVT prophylaxis subcutaneous Lovenox  No family members present emergency room. Above was discussed with patient was agreeable to it.   All the records are reviewed and case discussed with ED provider. Management plans discussed  with the patient and in agreement.  CODE STATUS: full  TOTAL CRITICALTIME TAKING CARE OF THIS PATIENT: 55 minutes.    PATEL,SONA M.D on 08/30/2015 at 9:10 AM  Between 7am to 6pm - Pager - [phone]  After 6pm go to [email or web address] - password [email or web address]  [facility]  Office  [phone]  CC: Primary care physician; [facility]

## 2015-08-30 NOTE — ED Notes (Addendum)
Per EMS, pt. In respiratory distress-  self-administering 7 duoneb treatments without relief. Hx of asthma and COPD.  solu-medrol given by EMS. Bp of 220/120, 130 bpm, 97.8 degrees.

## 2015-08-30 NOTE — ED Provider Notes (Signed)
-----------------------------------------   8:19 AM on 08/30/2015 ----------------------------------------- Samantha [name] was initially seen by Dr. Fanny Bien. Please see his history and physical for further background and details.  The patient had arrived in significant respiratory distress, requiring BiPAP. She received multiple neb treatments from EMS.  On reexam, the patient is alert, continuing on BiPAP, mildly tachypnea At 25 respirations per minute. She is slightly tachycardic at 110. Her oxygen saturation level is 100%.   I have advised the patient that we will work on admission, at which point she processed it and said she didn't want to be admitted. I reminded her that she is currently on BiPAP, needing respiratory support, had received multiple neb treatments, and that if she left the hospital would likely need to return sooner. She agreed to the admission.  I have spoken with Dr. Burna Cash to arrange for the admission.  Chest x-ray  IMPRESSION: Evidence of COPD with no acute findings   Labs Reviewed  CBC - Abnormal; Notable for the following:    RBC 5.23 (*)    HCT 47.2 (*)    RDW 14.6 (*)    All other components within normal limits  BASIC METABOLIC PANEL - Abnormal; Notable for the following:    Glucose, Bld 158 (*)    Creatinine, Ser 1.69 (*)    GFR calc non Af Amer 34 (*)    GFR calc Af Amer 40 (*)    All other components within normal limits  TROPONIN I  BRAIN NATRIURETIC PEPTIDE     Final diagnosis COPD with exacerbation Respiratory failure  [name], MD 08/30/15 [phone]

## 2015-08-30 NOTE — ED Notes (Signed)
Pt resting in stretcher.  Bipap on. Patient sitting. No needs at this time.  Patient is alert and oriented and states she is feeling a little better.

## 2015-08-30 NOTE — Progress Notes (Signed)
Pt alert and oriented; transitioned pt from Bipap to 2L O2 via nasal canula with O2 sats upper 90's; pt resting comfortably; respiratory status improved pt no longer using accessary muscles to breath; adequate uop via foley; vss; no c/o pain; tolerating diet; given orders by Dr. Allena Katz may change pts status to Stepdown,; will continue to monitor and assess pt

## 2015-08-30 NOTE — ED Notes (Signed)
Spoke with patient's daughter "Kinnie Scales"  Aware her mother is in the hospital.

## 2015-08-30 NOTE — ED Notes (Signed)
X-ray at bedside

## 2015-08-31 DIAGNOSIS — J9621 Acute and chronic respiratory failure with hypoxia: Principal | ICD-10-CM

## 2015-08-31 LAB — GLUCOSE, CAPILLARY
Glucose-Capillary: 216 mg/dL — ABNORMAL HIGH (ref 65–99)
Glucose-Capillary: 197 mg/dL — ABNORMAL HIGH (ref 65–99)

## 2015-08-31 MED ORDER — HYDRALAZINE HCL 20 MG/ML IJ SOLN
10.0000 mg | Freq: Four times a day (QID) | INTRAMUSCULAR | Status: DC | PRN
  Administered 2015-08-31: 10 mg via INTRAVENOUS
  Filled 2015-08-31: qty 1

## 2015-08-31 MED ORDER — PREDNISONE 10 MG PO TABS
60.0000 mg | ORAL_TABLET | Freq: Every day | ORAL | Status: DC
Start: 2015-08-31 — End: 2015-10-06

## 2015-08-31 MED ORDER — LOSARTAN POTASSIUM 25 MG PO TABS
12.5000 mg | ORAL_TABLET | Freq: Once | ORAL | Status: DC
  Filled 2015-08-31: qty 0.5

## 2015-08-31 MED ORDER — ALBUTEROL SULFATE (2.5 MG/3ML) 0.083% IN NEBU: 2.5000 mg | INHALATION_SOLUTION | RESPIRATORY_TRACT | Status: DC

## 2015-08-31 MED ORDER — LOSARTAN POTASSIUM-HCTZ 50-12.5 MG PO TABS: 2.0000 | ORAL_TABLET | Freq: Every day | ORAL | Status: DC

## 2015-08-31 MED ORDER — AZITHROMYCIN 250 MG PO TABS: ORAL_TABLET | ORAL | Status: DC

## 2015-08-31 MED ORDER — ACETAMINOPHEN 325 MG PO TABS
650.0000 mg | ORAL_TABLET | Freq: Four times a day (QID) | ORAL | Status: DC | PRN
  Administered 2015-08-31: 650 mg via ORAL
  Filled 2015-08-31: qty 2

## 2015-08-31 MED ORDER — SYMBICORT 160-4.5 MCG/ACT IN AERO: 2.0000 | INHALATION_SPRAY | Freq: Two times a day (BID) | RESPIRATORY_TRACT | Status: DC

## 2015-08-31 MED ORDER — LOSARTAN POTASSIUM 50 MG PO TABS
50.0000 mg | ORAL_TABLET | Freq: Once | ORAL | Status: AC
  Administered 2015-08-31: 50 mg via ORAL
  Filled 2015-08-31: qty 1

## 2015-08-31 NOTE — Discharge Summary (Signed)
[facility] - [place] at [facility]   PATIENT NAME: Samantha [name]    MR#:  [id number]  DATE OF BIRTH:  10/19/1966  DATE OF ADMISSION:  08/30/2015 ADMITTING PHYSICIAN: Enedina Finner, MD  DATE OF DISCHARGE: 08/31/15  PRIMARY CARE PHYSICIAN: [facility]    ADMISSION DIAGNOSIS:  COPD with exacerbation [J44.1]  DISCHARGE DIAGNOSIS:  Acute on chronic respiratory failure due to COPD flare Cocaine abuse Tobacco abuse  SECONDARY DIAGNOSIS:   Past Medical History  Diagnosis Date  . Asthma   . Hypertension   . Diabetes mellitus without complication   . COPD (chronic obstructive pulmonary disease)     HOSPITAL COURSE:    Ms. Samantha [name] is a 49 year old Caucasian female with history of tobacco abuse, chronic respiratory failure with COPD on 2 L home oxygen and history of diabetes comes to the emergency room with  1. Acute on chronic hypoxic respiratory failure secondary to COPD exacerbation in the setting of tobacco abuse. Patient was on BiPAP and now weaned to RA with sats 93-94%. She uses oxygen at bedtime. She is advised to use oxygen during daytime for next few days if she is sob IV Solu-Medrol -- prednisone taper Nebulizer every 4 hourly, continue Symbicort, Singulair,combivent Pulmonary consultation with Dr Belia Heman appreciated Patient is a full code.  Empiric po Zithromax  Chest x-ray negative for pneumonia.  2. Type 2 diabetes Sliding scale insulin. Carb-controlled diet. Continue home meds.  3. Hypertension Continue home meds  4. Tobacco abuse counseling done. 4 minutes spent. Advised patient to quit smoking.  5. DVT prophylaxis subcutaneous Lovenox  6.UDS positive for cocaine. Pt advised to quit using it.  Ambulate after breakfast. Pt wants to go home if she continues to feel better later today. Will ambulate after BF.   DISCHARGE CONDITIONS:   fair  CONSULTS OBTAINED:  Treatment Team:  Erin Fulling, MD  DRUG ALLERGIES:    Allergies  Allergen Reactions  . Percocet [Oxycodone-Acetaminophen] Itching    DISCHARGE MEDICATIONS:   Current Discharge Medication List    START taking these medications   Details  albuterol (PROVENTIL) (2.5 MG/3ML) 0.083% nebulizer solution Take 3 mLs (2.5 mg total) by nebulization every 4 (four) hours. Qty: 75 mL, Refills: 12    azithromycin (ZITHROMAX) 250 MG tablet Take 1 tab daily for 4 days Qty: 6 each, Refills: 0      CONTINUE these medications which have CHANGED   Details  predniSONE (DELTASONE) 10 MG tablet Take 6 tablets (60 mg total) by mouth daily with breakfast. Day 1 take 60 mg Day 2 50 mg Day 3 40 mg Day 4 30 mg Day 5 20 mg Day 6 10 mg then stop Qty: 21 tablet, Refills: 0    SYMBICORT 160-4.5 MCG/ACT inhaler Inhale 2 puffs into the lungs every 12 (twelve) hours. Qty: 1 Inhaler, Refills: 3      CONTINUE these medications which have NOT CHANGED   Details  albuterol (PROVENTIL HFA;VENTOLIN HFA) 108 (90 BASE) MCG/ACT inhaler Inhale 4-6 puffs by mouth every 4 hours as needed for wheezing, cough, and/or shortness of breath Qty: 1 Inhaler, Refills: 1    gabapentin (NEURONTIN) 300 MG capsule Take 300 mg by mouth 2 (two) times daily. Refills: 0    glimepiride (AMARYL) 4 MG tablet Take 4 mg by mouth 2 (two) times daily.  Refills: 0    losartan-hydrochlorothiazide (HYZAAR) 50-12.5 MG per tablet Take 1 tablet by mouth daily. Refills: 0    montelukast (SINGULAIR) 10 MG  tablet Take 1 tablet by mouth at bedtime. Refills: 0    tiotropium (SPIRIVA) 18 MCG inhalation capsule Place 18 mcg into inhaler and inhale daily.      STOP taking these medications     Fluticasone-Salmeterol (ADVAIR) 500-50 MCG/DOSE AEPB         If you experience worsening of your admission symptoms, develop shortness of breath, life threatening emergency, suicidal or homicidal thoughts you must seek medical attention immediately by calling 911 or calling your MD immediately  if  symptoms less severe.  You Must read complete instructions/literature along with all the possible adverse reactions/side effects for all the Medicines you take and that have been prescribed to you. Take any new Medicines after you have completely understood and accept all the possible adverse reactions/side effects.   Please note  You were cared for by a hospitalist during your hospital stay. If you have any questions about your discharge medications or the care you received while you were in the hospital after you are discharged, you can call the unit and asked to speak with the hospitalist on call if the hospitalist that took care of you is not available. Once you are discharged, your primary care physician will handle any further medical issues. Please note that NO REFILLS for any discharge medications will be authorized once you are discharged, as it is imperative that you return to your primary care physician (or establish a relationship with a primary care physician if you do not have one) for your aftercare needs so that they can reassess your need for medications and monitor your lab values. Today   SUBJECTIVE   Some cough. Feels a lot better  VITAL SIGNS:  Blood pressure 130/81, pulse 97, temperature 97.5 F (36.4 C), temperature source Axillary, resp. rate 19, height  (1.702 m), weight 117.935 kg (260 lb), SpO2 98 %.  I/O:   Intake/Output Summary (Last 24 hours) at 08/31/15 0811 Last data filed at 08/31/15 0000  Gross per 24 hour  Intake   1320 ml  Output   2175 ml  Net   -855 ml    PHYSICAL EXAMINATION:  GENERAL:  49 y.o.-year-old patient lying in the bed with no acute distress.  EYES: Pupils equal, round, reactive to light and accommodation. No scleral icterus. Extraocular muscles intact.  HEENT: Head atraumatic, normocephalic. Oropharynx and nasopharynx clear.  NECK:  Supple, no jugular venous distention. No thyroid enlargement, no tenderness.  LUNGS: coarse breath  sounds bilaterally,scattered wheezing, rales,rhonchi or crepitation. No use of accessory muscles of respiration.  CARDIOVASCULAR: S1, S2 normal. No murmurs, rubs, or gallops.  ABDOMEN: Soft, non-tender, non-distended. Bowel sounds present. No organomegaly or mass.  EXTREMITIES: No pedal edema, cyanosis, or clubbing.  NEUROLOGIC: Cranial nerves II through XII are intact. Muscle strength 5/5 in all extremities. Sensation intact. Gait not checked.  PSYCHIATRIC: The patient is alert and oriented x 3.  SKIN: No obvious rash, lesion, or ulcer.   DATA REVIEW:   CBC   Recent Labs Lab 08/30/15 0716  WBC 10.0  HGB 15.4  HCT 47.2*  PLT 361    Chemistries   Recent Labs Lab 08/30/15 0716  NA 141  K 4.4  CL 110  CO2 25  GLUCOSE 158*  BUN 20  CREATININE 1.69*  CALCIUM 9.1    Microbiology Results   Recent Results (from the past 240 hour(s))  MRSA PCR Screening     Status: None   Collection Time: 08/30/15 11:49 AM  Result Value  Ref Range Status   MRSA by PCR NEGATIVE NEGATIVE Final    Comment:        The GeneXpert MRSA Assay (FDA approved for NASAL specimens only), is one component of a comprehensive MRSA colonization surveillance program. It is not intended to diagnose MRSA infection nor to guide or monitor treatment for MRSA infections.     RADIOLOGY:  Dg Chest Port 1 View  08/30/2015   CLINICAL DATA:  Acute onset respiratory distress and shortness of breath, current history of COPD  EXAM: PORTABLE CHEST - 1 VIEW  COMPARISON:  07/31/2015  FINDINGS: Stable hyperinflation. Lungs clear. Heart size and vascular pattern normal.  IMPRESSION: Evidence of COPD with no acute findings   Electronically Signed   By: Esperanza Heir M.D.   On: 08/30/2015 07:18     Management plans discussed with the patient, family and they are in agreement.  CODE STATUS:     Code Status Orders        Start     Ordered   08/30/15 1120  Full code   Continuous     08/30/15 1119       TOTAL TIME TAKING CARE OF THIS PATIENT: 40 minutes.    PATEL,SONA M.D on 08/31/2015 at 8:11 AM  Between 7am to 6pm - Pager - [phone] After 6pm go to [email or web address] - password [email or web address]  [place] [facility]  Office  [phone]  CC: Primary care physician; [facility]

## 2015-08-31 NOTE — Progress Notes (Addendum)
[facility] Critical Care Medicine Progess Note     ASSESSMENT/PLAN   49 yo AAF with acute on chronic resp failure from acute COPD exacerbation likely from ongoing tobacco abuse and bronchitis/atypical pneumonia   PULMONARY- -AECOPD-- May have been exacerbated by cocaine use (utox positive). Doing better.  -On solumedrol 40 mg bid; can change to prednisone taper.  -continue current abx -- azithromycin -alb nebs every 4 hrs -symbicort/spiriva -pulmicort nebs -Doing better today; now ambulatory and on room air.   OK to discharge home from respiratory standpoint, discussed the importance of cessational of recreational drug use.   --Follow up with Dr. Belia Heman outpatient in 4 to 6 weeks for pulmonary function testing.     CARDIOVASCULAR Stable.   RENAL Follow chem 7, follow uo  GASTROINTESTINAL No current issues.   HEMATOLOGIC folow cbc  INFECTIOUS Atypical pneumonia; pulmonary edema, doing better.   ENDOCRINE Follow fsbs  NEUROLOGIC -alert and awake, avoid excessive sedatives       MAJOR EVENTS/TEST RESULTS:   INDWELLING DEVICES::  MICRO DATA: MRSA PCR  Urine  Blood Resp   ANTIMICROBIALS:    ---------------------------------------   ----------------------------------------   Name: Samantha [name] MRN: [id number] DOB: 01-23-1966    ADMISSION DATE:  08/30/2015    CHIEF COMPLAINT:  dyspnea   SUBJECTIVE:     Appears comfortable, awake, alert. Ambulatory around the room. No new complaints.   Review of Systems:  Complete review of systems was obtained and was negative other than what is mentioned above.   VITAL SIGNS: Temp:  [97.5 F (36.4 C)-97.9 F (36.6 C)] 97.5 F (36.4 C) (09/19 0700) Pulse Rate:  [87-113] 97 (09/19 0700) Resp:  [13-26] 19 (09/19 0700) BP: (130-176)/(79-102) 130/81 mmHg (09/19 0700) SpO2:  [91 %-100 %] 98 % (09/19 0805) FiO2 (%):  [35 %-40 %] 35 % (09/19 0335) HEMODYNAMICS:   VENTILATOR SETTINGS: Vent  Mode:  [-]  FiO2 (%):  [35 %-40 %] 35 % INTAKE / OUTPUT:  Intake/Output Summary (Last 24 hours) at 08/31/15 0858 Last data filed at 08/31/15 0000  Gross per 24 hour  Intake   1320 ml  Output   2175 ml  Net   -855 ml    PHYSICAL EXAMINATION: Physical Examination:   VS: BP 130/81 mmHg  Pulse 97  Temp(Src) 97.5 F (36.4 C) (Axillary)  Resp 19  Ht  (1.702 m)  Wt 117.935 kg (260 lb)  BMI 40.71 kg/m2  SpO2 98%  General Appearance: No distress  Neuro:without focal findings, mental status normal. HEENT: PERRLA, EOM intact. Pulmonary: normal breath sounds   CardiovascularNormal S1,S2.  No m/r/g.   Abdomen: Benign, Soft, non-tender. Renal:  No costovertebral tenderness  GU:  Not performed at this time. Endocrine: No evident thyromegaly. Skin:   warm, no rashes, no ecchymosis  Extremities: normal, no cyanosis, clubbing.   LABS:   LABORATORY PANEL:   CBC  Recent Labs Lab 08/30/15 0716  WBC 10.0  HGB 15.4  HCT 47.2*  PLT 361    Chemistries   Recent Labs Lab 08/30/15 0716  NA 141  K 4.4  CL 110  CO2 25  GLUCOSE 158*  BUN 20  CREATININE 1.69*  CALCIUM 9.1     Recent Labs Lab 08/30/15 1602 08/30/15 2146 08/31/15 0720  GLUCAP 278* 222* 216*   No results for input(s): PHART, PCO2ART, PO2ART in the last 168 hours. No results for input(s): AST, ALT, ALKPHOS, BILITOT, ALBUMIN in the last 168 hours.  Cardiac Enzymes  Recent  Labs Lab 08/30/15 0716  TROPONINI <0.03    RADIOLOGY:  Dg Chest Port 1 View  08/30/2015   CLINICAL DATA:  Acute onset respiratory distress and shortness of breath, current history of COPD  EXAM: PORTABLE CHEST - 1 VIEW  COMPARISON:  07/31/2015  FINDINGS: Stable hyperinflation. Lungs clear. Heart size and vascular pattern normal.  IMPRESSION: Evidence of COPD with no acute findings   Electronically Signed   By: Esperanza Heir M.D.   On: 08/30/2015 07:18       --Wells Guiles, MD.  Pager [phone] [place]  Pulmonary [facility] Critical [facility] Office Number: [phone]  Santiago Glad, M.D.  Stephanie Acre, M.D.  Billy Fischer, M.D

## 2015-08-31 NOTE — Progress Notes (Signed)
Pt oob ambulating around unit unassisted with no reported or observed resp distress

## 2015-08-31 NOTE — Progress Notes (Signed)
D/c home per md order, instructions given, prescriptions for prednisone given and also md called inhaler to pts pharmacy, iv's d/c, escorted out via wheelchair by auxillary to meet daughter for pick up.

## 2015-08-31 NOTE — Discharge Instructions (Signed)
Stop using cocaine and Stop smoking

## 2015-09-01 LAB — GLUCOSE, CAPILLARY: Glucose-Capillary: 169 mg/dL — ABNORMAL HIGH (ref 65–99)

## 2015-09-02 ENCOUNTER — Telehealth: Payer: Self-pay | Admitting: *Deleted

## 2015-09-02 NOTE — Telephone Encounter (Signed)
-----   Message from Shane Crutch, MD sent at 08/31/2015  3:11 PM EDT ----- Pt being discharged today; needs follow up with Dr. Belia Heman in about 1 month with PFT and , ideally before the appt.

## 2015-09-02 NOTE — Telephone Encounter (Signed)
Called pt to schedule SMW/PFT and f/u appt with Dr. Belia Heman but pt states she wasn't feeling well at the moment and ask if I would call back later.

## 2015-09-03 IMAGING — CR DG CHEST 2V
1 series · 2 of 2 positions shown · non-contrast
Comparison: February 27, 2014

CLINICAL DATA: Shortness of breath, chest pain.

EXAM:
CHEST  2 VIEW

[Series 1: w chest pa · 0.14mm/px · 2 of 2 slices shown]
[im 1/2]
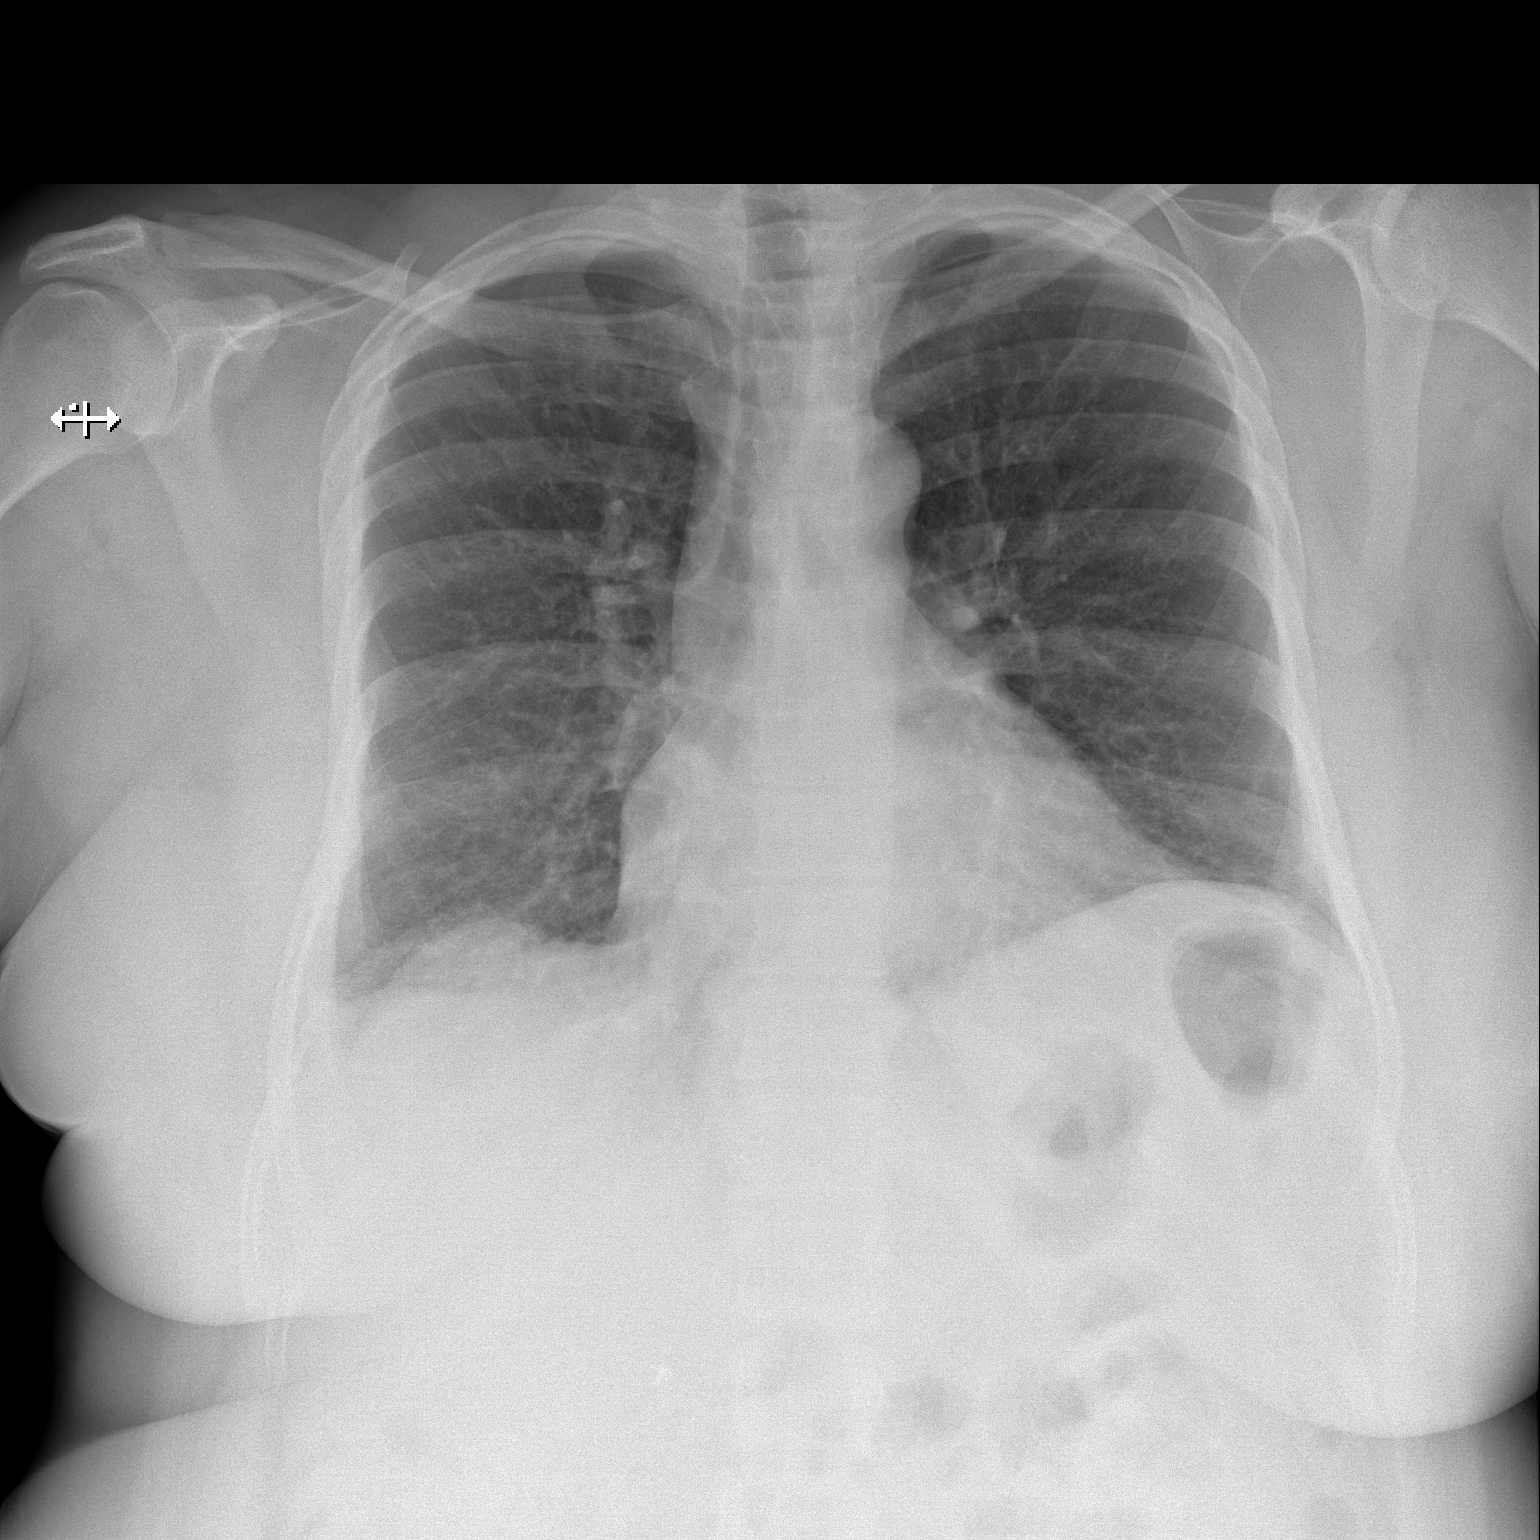
[im 2/2]
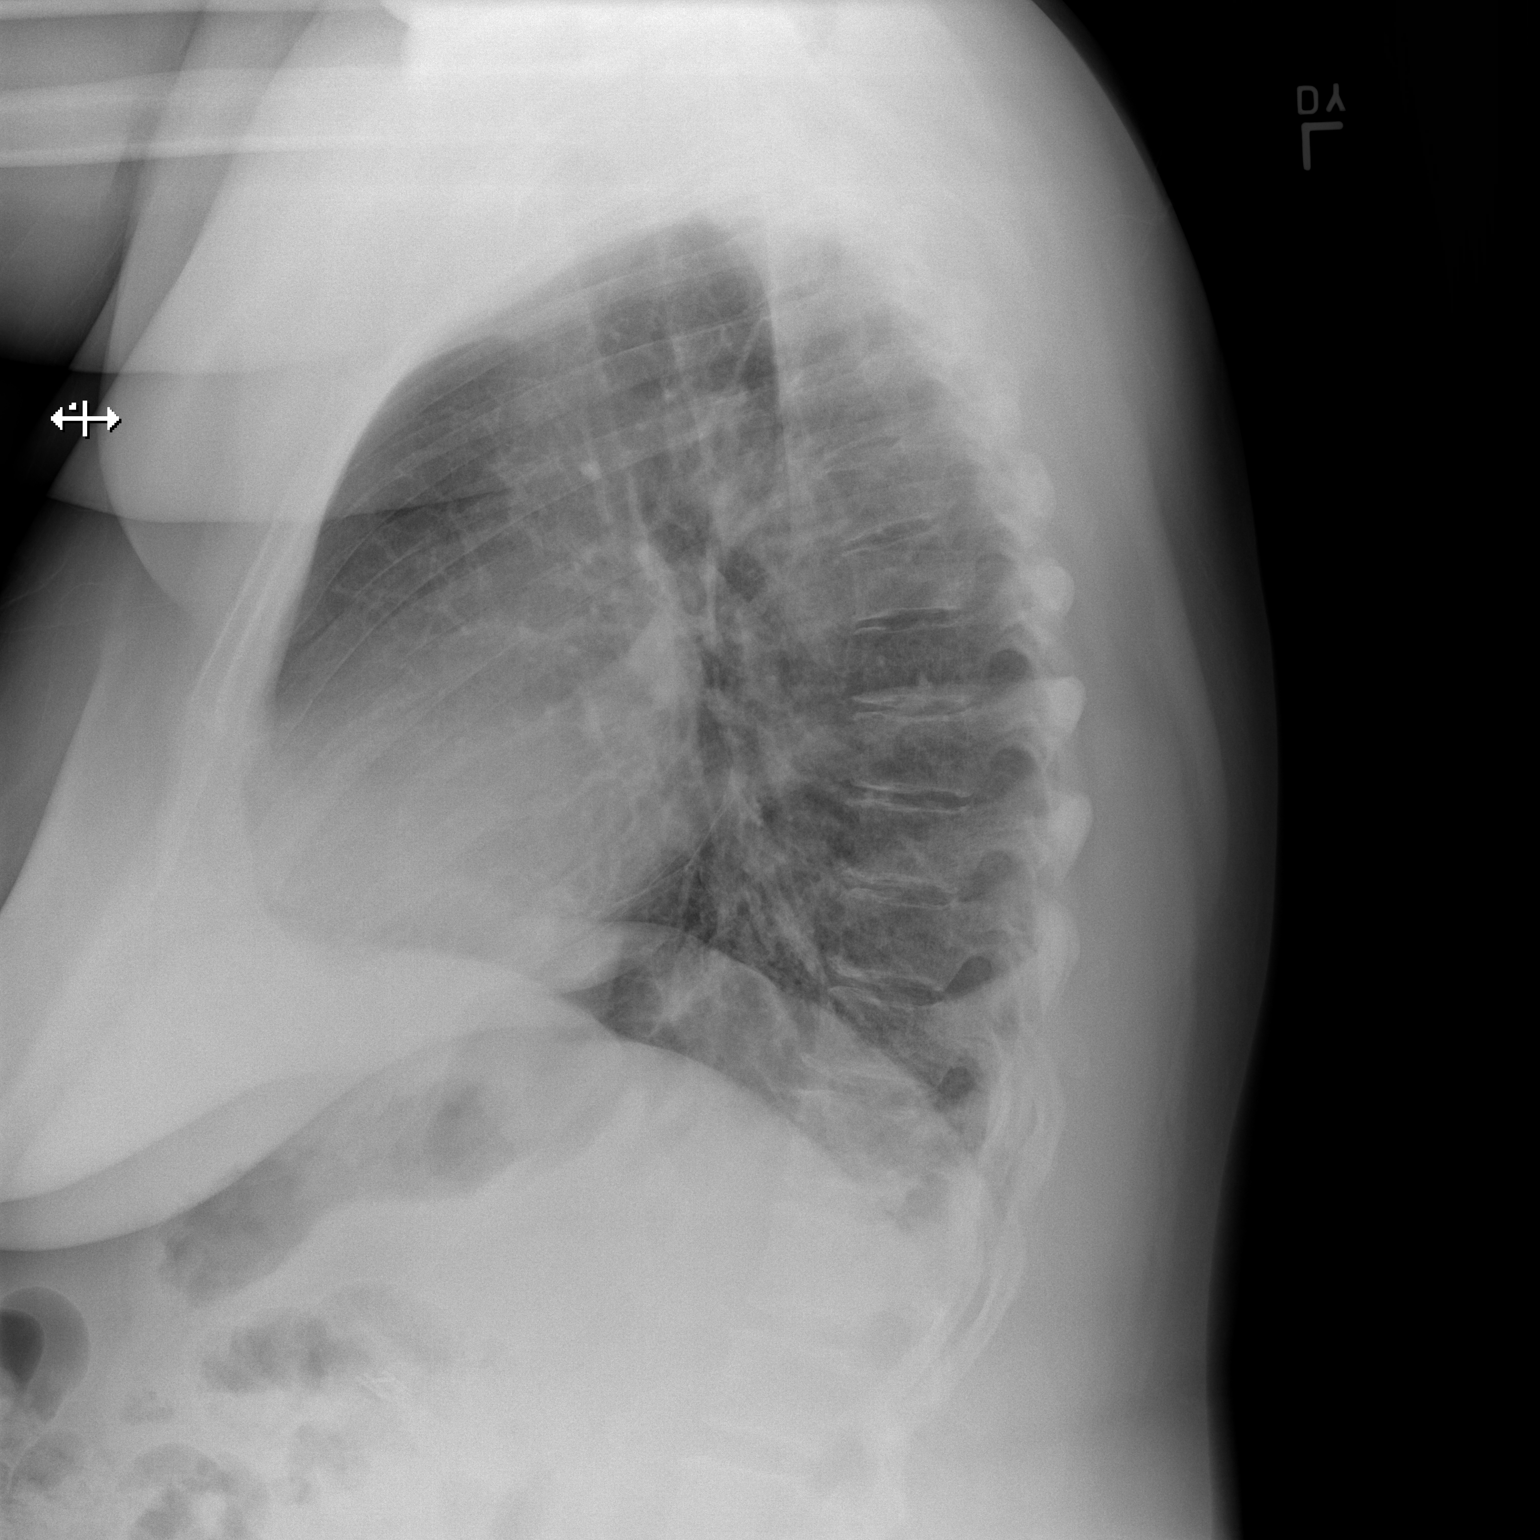

[2 of 2 positions shown; findings below may reference images not displayed]

FINDINGS: The heart size and mediastinal contours are within normal limits.
Both lungs are clear. No pneumothorax or pleural effusion is noted.
The visualized skeletal structures are unremarkable.
IMPRESSION: No acute cardiopulmonary abnormality seen.

## 2015-09-03 NOTE — Telephone Encounter (Signed)
Tried to call pt to schedule f/u appts. No answer and VM has not been setup. Will try again later.

## 2015-09-15 NOTE — Telephone Encounter (Signed)
ATC PT NA, no VM, WCB

## 2015-09-21 NOTE — Telephone Encounter (Signed)
Informed pt that we needed to schedule SMW and PFT and OV and pt states she will f/u with her PCP. Offered to just schedule OV with provider and pt still declined. Nothing further needed.

## 2015-10-05 DIAGNOSIS — R0602 Shortness of breath: Secondary | ICD-10-CM | POA: Diagnosis not present

## 2015-10-06 ENCOUNTER — Inpatient Hospital Stay
Admission: EM | Admit: 2015-10-06 | Discharge: 2015-10-08 | DRG: 190 | Disposition: A | Discharge status: Home Health | Payer: Medicare Other | Attending: Internal Medicine | Admitting: Internal Medicine

## 2015-10-06 ENCOUNTER — Encounter: Payer: Self-pay | Admitting: *Deleted

## 2015-10-06 ENCOUNTER — Emergency Department: Payer: Medicare Other

## 2015-10-06 DIAGNOSIS — J9622 Acute and chronic respiratory failure with hypercapnia: Secondary | ICD-10-CM | POA: Diagnosis not present

## 2015-10-06 DIAGNOSIS — E669 Obesity, unspecified: Secondary | ICD-10-CM | POA: Diagnosis present

## 2015-10-06 DIAGNOSIS — J45909 Unspecified asthma, uncomplicated: Secondary | ICD-10-CM | POA: Diagnosis present

## 2015-10-06 DIAGNOSIS — Z885 Allergy status to narcotic agent status: Secondary | ICD-10-CM

## 2015-10-06 DIAGNOSIS — J9620 Acute and chronic respiratory failure, unspecified whether with hypoxia or hypercapnia: Secondary | ICD-10-CM | POA: Diagnosis present

## 2015-10-06 DIAGNOSIS — Z79899 Other long term (current) drug therapy: Secondary | ICD-10-CM

## 2015-10-06 DIAGNOSIS — J441 Chronic obstructive pulmonary disease with (acute) exacerbation: Secondary | ICD-10-CM | POA: Diagnosis not present

## 2015-10-06 DIAGNOSIS — Z720 Tobacco use: Secondary | ICD-10-CM | POA: Diagnosis not present

## 2015-10-06 DIAGNOSIS — E874 Mixed disorder of acid-base balance: Secondary | ICD-10-CM | POA: Diagnosis present

## 2015-10-06 DIAGNOSIS — R0600 Dyspnea, unspecified: Secondary | ICD-10-CM | POA: Diagnosis not present

## 2015-10-06 DIAGNOSIS — Z716 Tobacco abuse counseling: Secondary | ICD-10-CM | POA: Diagnosis not present

## 2015-10-06 DIAGNOSIS — J9601 Acute respiratory failure with hypoxia: Secondary | ICD-10-CM | POA: Diagnosis not present

## 2015-10-06 DIAGNOSIS — F17210 Nicotine dependence, cigarettes, uncomplicated: Secondary | ICD-10-CM | POA: Diagnosis present

## 2015-10-06 DIAGNOSIS — J449 Chronic obstructive pulmonary disease, unspecified: Secondary | ICD-10-CM | POA: Diagnosis not present

## 2015-10-06 DIAGNOSIS — N183 Chronic kidney disease, stage 3 (moderate): Secondary | ICD-10-CM | POA: Diagnosis present

## 2015-10-06 DIAGNOSIS — I129 Hypertensive chronic kidney disease with stage 1 through stage 4 chronic kidney disease, or unspecified chronic kidney disease: Secondary | ICD-10-CM | POA: Diagnosis present

## 2015-10-06 DIAGNOSIS — E119 Type 2 diabetes mellitus without complications: Secondary | ICD-10-CM | POA: Diagnosis present

## 2015-10-06 DIAGNOSIS — Z7984 Long term (current) use of oral hypoglycemic drugs: Secondary | ICD-10-CM

## 2015-10-06 DIAGNOSIS — R0602 Shortness of breath: Secondary | ICD-10-CM | POA: Diagnosis not present

## 2015-10-06 DIAGNOSIS — Z6841 Body Mass Index (BMI) 40.0 and over, adult: Secondary | ICD-10-CM

## 2015-10-06 LAB — BLOOD GAS, ARTERIAL
ALLENS TEST (PASS/FAIL): POSITIVE — AB
Acid-base deficit: 0.8 mmol/L (ref 0.0–2.0)
BICARBONATE: 26.2 meq/L (ref 21.0–28.0)
DELIVERY SYSTEMS: POSITIVE
EXPIRATORY PAP: 6
FIO2: 0.4
INSPIRATORY PAP: 12
O2 SAT: 99.6 %
Patient temperature: 37
pCO2 arterial: 52 mmHg — ABNORMAL HIGH (ref 32.0–48.0)
pH, Arterial: 7.31 — ABNORMAL LOW (ref 7.350–7.450)
pO2, Arterial: 190 mmHg — ABNORMAL HIGH (ref 83.0–108.0)

## 2015-10-06 LAB — GLUCOSE, CAPILLARY
Glucose-Capillary: 216 mg/dL — ABNORMAL HIGH (ref 65–99)
Glucose-Capillary: 226 mg/dL — ABNORMAL HIGH (ref 65–99)
Glucose-Capillary: 225 mg/dL — ABNORMAL HIGH (ref 65–99)
Glucose-Capillary: 205 mg/dL — ABNORMAL HIGH (ref 65–99)

## 2015-10-06 LAB — TROPONIN I: Troponin I: <0.03 ng/mL (ref <0.031)

## 2015-10-06 LAB — COMPREHENSIVE METABOLIC PANEL
ALBUMIN: 4.2 g/dL (ref 3.5–5.0)
ALK PHOS: 82 U/L (ref 38–126)
ALT: 54 U/L (ref 14–54)
AST: 39 U/L (ref 15–41)
Anion gap: 9 (ref 5–15)
BUN: 21 mg/dL — AB (ref 6–20)
CALCIUM: 9.9 mg/dL (ref 8.9–10.3)
CO2: 23 mmol/L (ref 22–32)
CREATININE: 1.54 mg/dL — AB (ref 0.44–1.00)
Chloride: 107 mmol/L (ref 101–111)
GFR calc Af Amer: 44 mL/min — ABNORMAL LOW (ref >60)
GFR calc non Af Amer: 38 mL/min — ABNORMAL LOW (ref >60)
GLUCOSE: 163 mg/dL — AB (ref 65–99)
Potassium: 4.4 mmol/L (ref 3.5–5.1)
SODIUM: 139 mmol/L (ref 135–145)
Total Bilirubin: 0.5 mg/dL (ref 0.3–1.2)
Total Protein: 7.5 g/dL (ref 6.5–8.1)

## 2015-10-06 LAB — TSH: TSH: 2.091 u[IU]/mL (ref 0.350–4.500)

## 2015-10-06 LAB — CBC
HCT: 48.2 % — ABNORMAL HIGH (ref 35.0–47.0)
HEMOGLOBIN: 15.5 g/dL (ref 12.0–16.0)
MCH: 29.1 pg (ref 26.0–34.0)
MCHC: 32.2 g/dL (ref 32.0–36.0)
MCV: 90.3 fL (ref 80.0–100.0)
Platelets: 315 10*3/uL (ref 150–440)
RBC: 5.33 MIL/uL — AB (ref 3.80–5.20)
RDW: 14.1 % (ref 11.5–14.5)
WBC: 12.2 10*3/uL — ABNORMAL HIGH (ref 3.6–11.0)

## 2015-10-06 LAB — HEMOGLOBIN A1C: Hgb A1c MFr Bld: 7.7 % — ABNORMAL HIGH (ref 4.0–6.0)

## 2015-10-06 LAB — MRSA PCR SCREENING: MRSA BY PCR: NEGATIVE

## 2015-10-06 MED ORDER — INSULIN ASPART 100 UNIT/ML ~~LOC~~ SOLN
0.0000 [IU] | Freq: Three times a day (TID) | SUBCUTANEOUS | Status: DC
  Administered 2015-10-06: 5 [IU] via SUBCUTANEOUS
  Administered 2015-10-06: 100 [IU] via SUBCUTANEOUS
  Administered 2015-10-06: 5 [IU] via SUBCUTANEOUS
  Administered 2015-10-07: 2 [IU] via SUBCUTANEOUS
  Administered 2015-10-07: 3 [IU] via SUBCUTANEOUS
  Administered 2015-10-07: 8 [IU] via SUBCUTANEOUS
  Filled 2015-10-06: qty 5
  Filled 2015-10-06: qty 3
  Filled 2015-10-06: qty 2
  Filled 2015-10-06: qty 5
  Filled 2015-10-06: qty 8

## 2015-10-06 MED ORDER — MAGNESIUM SULFATE 2 GM/50ML IV SOLN
INTRAVENOUS | Status: AC
  Filled 2015-10-06: qty 50

## 2015-10-06 MED ORDER — ACETAMINOPHEN 650 MG RE SUPP: 650.0000 mg | Freq: Four times a day (QID) | RECTAL | Status: DC | PRN

## 2015-10-06 MED ORDER — HEPARIN SODIUM (PORCINE) 5000 UNIT/ML IJ SOLN
5000.0000 [IU] | Freq: Three times a day (TID) | INTRAMUSCULAR | Status: DC
  Administered 2015-10-06 – 2015-10-07 (×4): 5000 [IU] via SUBCUTANEOUS
  Filled 2015-10-06 (×3): qty 1

## 2015-10-06 MED ORDER — PREDNISONE 50 MG PO TABS
60.0000 mg | ORAL_TABLET | Freq: Every day | ORAL | Status: DC
  Administered 2015-10-06 – 2015-10-08 (×3): 60 mg via ORAL
  Filled 2015-10-06 (×2): qty 3
  Filled 2015-10-06: qty 1

## 2015-10-06 MED ORDER — ALBUTEROL SULFATE (2.5 MG/3ML) 0.083% IN NEBU
INHALATION_SOLUTION | RESPIRATORY_TRACT | Status: AC
  Administered 2015-10-06: 2.5 mg
  Filled 2015-10-06: qty 3

## 2015-10-06 MED ORDER — DOCUSATE SODIUM 100 MG PO CAPS
100.0000 mg | ORAL_CAPSULE | Freq: Two times a day (BID) | ORAL | Status: DC
  Administered 2015-10-06 – 2015-10-08 (×5): 100 mg via ORAL
  Filled 2015-10-06 (×5): qty 1

## 2015-10-06 MED ORDER — LOSARTAN POTASSIUM 50 MG PO TABS
100.0000 mg | ORAL_TABLET | Freq: Every day | ORAL | Status: DC
  Administered 2015-10-06 – 2015-10-08 (×3): 100 mg via ORAL
  Filled 2015-10-06 (×3): qty 2

## 2015-10-06 MED ORDER — MAGNESIUM SULFATE 2 GM/50ML IV SOLN
2.0000 g | Freq: Once | INTRAVENOUS | Status: AC
  Administered 2015-10-06: 01:00:00 via INTRAVENOUS

## 2015-10-06 MED ORDER — HYDROCHLOROTHIAZIDE 25 MG PO TABS
25.0000 mg | ORAL_TABLET | Freq: Every day | ORAL | Status: DC
  Administered 2015-10-06 – 2015-10-08 (×3): 25 mg via ORAL
  Filled 2015-10-06 (×3): qty 1

## 2015-10-06 MED ORDER — MONTELUKAST SODIUM 10 MG PO TABS
10.0000 mg | ORAL_TABLET | Freq: Every day | ORAL | Status: DC
  Administered 2015-10-06 – 2015-10-07 (×2): 10 mg via ORAL
  Filled 2015-10-06 (×2): qty 1

## 2015-10-06 MED ORDER — LORAZEPAM 2 MG/ML IJ SOLN: 1.0000 mg | INTRAMUSCULAR | Status: DC | PRN

## 2015-10-06 MED ORDER — ONDANSETRON HCL 4 MG PO TABS: 4.0000 mg | ORAL_TABLET | Freq: Four times a day (QID) | ORAL | Status: DC | PRN

## 2015-10-06 MED ORDER — ACETAMINOPHEN 325 MG PO TABS: 650.0000 mg | ORAL_TABLET | Freq: Four times a day (QID) | ORAL | Status: DC | PRN

## 2015-10-06 MED ORDER — LORAZEPAM 2 MG/ML IJ SOLN
INTRAMUSCULAR | Status: AC
Start: 2015-10-06 — End: 2015-10-06
  Administered 2015-10-06: 1 mg
  Filled 2015-10-06: qty 1

## 2015-10-06 MED ORDER — CETYLPYRIDINIUM CHLORIDE 0.05 % MT LIQD
7.0000 mL | Freq: Two times a day (BID) | OROMUCOSAL | Status: DC
  Administered 2015-10-06 – 2015-10-07 (×3): 7 mL via OROMUCOSAL

## 2015-10-06 MED ORDER — BUDESONIDE 0.25 MG/2ML IN SUSP
0.2500 mg | Freq: Two times a day (BID) | RESPIRATORY_TRACT | Status: AC
  Administered 2015-10-06 – 2015-10-07 (×4): 0.25 mg via RESPIRATORY_TRACT
  Filled 2015-10-06 (×4): qty 2

## 2015-10-06 MED ORDER — TIOTROPIUM BROMIDE MONOHYDRATE 18 MCG IN CAPS
18.0000 ug | ORAL_CAPSULE | Freq: Every day | RESPIRATORY_TRACT | Status: DC
  Filled 2015-10-06: qty 5

## 2015-10-06 MED ORDER — AZITHROMYCIN 250 MG PO TABS
250.0000 mg | ORAL_TABLET | Freq: Every day | ORAL | Status: DC
  Administered 2015-10-07 – 2015-10-08 (×2): 250 mg via ORAL
  Filled 2015-10-06 (×2): qty 1

## 2015-10-06 MED ORDER — IPRATROPIUM-ALBUTEROL 0.5-2.5 (3) MG/3ML IN SOLN
3.0000 mL | Freq: Four times a day (QID) | RESPIRATORY_TRACT | Status: DC
  Administered 2015-10-06 – 2015-10-08 (×8): 3 mL via RESPIRATORY_TRACT
  Filled 2015-10-06 (×8): qty 3

## 2015-10-06 MED ORDER — SODIUM CHLORIDE 0.9 % IJ SOLN
3.0000 mL | Freq: Two times a day (BID) | INTRAMUSCULAR | Status: DC
  Administered 2015-10-06 – 2015-10-07 (×4): 3 mL via INTRAVENOUS

## 2015-10-06 MED ORDER — ALBUTEROL SULFATE (2.5 MG/3ML) 0.083% IN NEBU
2.5000 mg | INHALATION_SOLUTION | RESPIRATORY_TRACT | Status: DC
  Administered 2015-10-06: 2.5 mg via RESPIRATORY_TRACT
  Filled 2015-10-06: qty 3

## 2015-10-06 MED ORDER — GABAPENTIN 300 MG PO CAPS
300.0000 mg | ORAL_CAPSULE | Freq: Two times a day (BID) | ORAL | Status: DC
  Administered 2015-10-06 – 2015-10-08 (×5): 300 mg via ORAL
  Filled 2015-10-06 (×7): qty 1

## 2015-10-06 MED ORDER — ONDANSETRON HCL 4 MG/2ML IJ SOLN: 4.0000 mg | Freq: Four times a day (QID) | INTRAMUSCULAR | Status: DC | PRN

## 2015-10-06 MED ORDER — LOSARTAN POTASSIUM-HCTZ 50-12.5 MG PO TABS: 2.0000 | ORAL_TABLET | Freq: Every day | ORAL | Status: DC

## 2015-10-06 MED ORDER — BUDESONIDE-FORMOTEROL FUMARATE 160-4.5 MCG/ACT IN AERO
2.0000 | INHALATION_SPRAY | Freq: Two times a day (BID) | RESPIRATORY_TRACT | Status: DC
  Administered 2015-10-06: 2 via RESPIRATORY_TRACT
  Filled 2015-10-06: qty 6

## 2015-10-06 MED ORDER — AZITHROMYCIN 250 MG PO TABS
500.0000 mg | ORAL_TABLET | Freq: Every day | ORAL | Status: AC
  Administered 2015-10-06: 500 mg via ORAL
  Filled 2015-10-06: qty 2

## 2015-10-06 MED ORDER — NICOTINE 21 MG/24HR TD PT24
21.0000 mg | MEDICATED_PATCH | Freq: Every day | TRANSDERMAL | Status: DC
  Administered 2015-10-06 – 2015-10-07 (×2): 21 mg via TRANSDERMAL
  Filled 2015-10-06 (×2): qty 1

## 2015-10-06 MED ORDER — SODIUM CHLORIDE 0.9 % IV SOLN
INTRAVENOUS | Status: DC
  Administered 2015-10-06: 05:00:00 via INTRAVENOUS

## 2015-10-06 MED ORDER — INSULIN ASPART 100 UNIT/ML ~~LOC~~ SOLN
0.0000 [IU] | Freq: Every day | SUBCUTANEOUS | Status: DC
  Administered 2015-10-06: 2 [IU] via SUBCUTANEOUS
  Filled 2015-10-06: qty 2

## 2015-10-06 MED ORDER — IPRATROPIUM-ALBUTEROL 0.5-2.5 (3) MG/3ML IN SOLN: 3.0000 mL | RESPIRATORY_TRACT | Status: DC | PRN

## 2015-10-06 NOTE — Progress Notes (Signed)
Spoke to Dr. Clint GuyHower; advised that [facility]. Patient went from NS @100ml / hr to 50 ml/hr. Patient doesn't want another IV access started unless really necessary. Dr. Clint GuyHower advised that fluids can be discontinued but to leave IV in place.

## 2015-10-06 NOTE — Progress Notes (Signed)
Pt stated she wanted off bipap, pt placed on 2lpm [place]f, no respiratory distress noted, sats 96%, respiratory rate 22/min

## 2015-10-06 NOTE — ED Notes (Signed)
SOB and difficulty breathing x 4 hours PTA. Bilateral wheezing, tripoding on arrival.

## 2015-10-06 NOTE — Progress Notes (Signed)
PT Contraindication Note  Patient Details Name: Samantha [name] A Pat MRN: [id number] DOB: 09-10-1966   Cancelled Treatment:    Reason Eval/Treat Not Completed: Medical issues which prohibited therapy. Chart reviewed and RN consulted. Pt was tried this AM on nasal canula but failed and was placed back on BiPap. She is currently being transitioned to [place]if[place] nasal canula. RN reports that pt is steady and stable in standing but with severe DOE. Will wait until respiratory status is stable and attempt PT evaluation.   Sharalyn InkJason D Zonya Gudger PT, DPT   Ailyn Gladd 10/06/2015, 8:56 AM

## 2015-10-06 NOTE — Care Management Note (Signed)
Case Management Note  Patient Details  Name: Samantha [name] MRN: [id number] Date of Birth: 10/28/1966  Subjective/Objective:    Case management consult received for COPD protocol. Multiple ED visits noted. This is the second admission to ICU since September. It is noted that patient continues to smoke. HX: Cocaine abuse. Met with patient at bedside. She is currently on BiPAP with shortness of breath at rest noted. Discussed  Home health services with patient. She replied I would like,"[facility]". RNCM explained the difference in PCS and home health. She adamantly refused a nurse to come out to her home. "I am not a vegetable". Re interated the purpose of a home care nurse and how she could help prevent further hospitalizations but she would not agree to allow a nurse to come out. I left a list of home care providers in the event she changes her mind. She lives at home with her daughter. She has 02 @ 2L at HS through [facility]. She does not have portable tanks. PCP is at [facility]. Last seen 2 months ago. Reports compliance with her inhalers and medications. Denies issues obtaining medications, uses [facility] for transportation. Offered to assist with any discharge planing needs. Patient states she will need a ride home. I encouraged her to start making plans for some one to pick her up at discharge or to call her regular transportation as we do not normally provide taxy vouchers.            Action/Plan: Home with self care, follow up with PCP. Will monitor progression.   Expected Discharge Date:                  Expected Discharge Plan:  Home w Home Health Services  In-House Referral:     Discharge planning Services  CM Consult  Post Acute Care Choice:  Home Health Choice offered to:     DME Arranged:    DME Agency:     HH Arranged:    HH Agency:     Status of Service:  In process, will continue to follow  Medicare Important Message Given:    Date Medicare IM  Given:    Medicare IM give by:    Date Additional Medicare IM Given:    Additional Medicare Important Message give by:     If discussed at Long Length of Stay Meetings, dates discussed:    Additional Comments:   M , RN 10/06/2015, 9:20 AM  

## 2015-10-06 NOTE — ED Provider Notes (Signed)
[facility] Emergency Department Provider Note  ____________________________________________  Time seen: 1:10 a.m.  I have reviewed the triage vital signs and the nursing notes.  History of physical exam limited secondary to respiratory distress HISTORY  Chief Complaint Shortness of Breath      HPI Samantha [name] is a 49 y.o. female presents via EMS in respiratory distress. Per EMS patient is had increasing difficulty breathing 4 hours. Patient denies any fever. Patient received 2 DuoNeb's and 125 mg of Solu-Medrol IV in route. Patient able speak in one word phrases    Past Medical History  Diagnosis Date  . Asthma   . Hypertension   . Diabetes mellitus without complication (HCC)   . COPD (chronic obstructive pulmonary disease) [facility](HCC)     Patient Active Problem List   Diagnosis Date Noted  . Acute on chronic respiratory failure with hypoxemia (HCC) 08/30/2015    History reviewed. No pertinent past surgical history.  Current Outpatient Rx  Name  Route  Sig  Dispense  Refill  . albuterol (PROVENTIL HFA;VENTOLIN HFA) 108 (90 BASE) MCG/ACT inhaler      Inhale 4-6 puffs by mouth every 4 hours as needed for wheezing, cough, and/or shortness of breath   1 Inhaler   1   . albuterol (PROVENTIL) (2.5 MG/3ML) 0.083% nebulizer solution   Nebulization   Take 3 mLs (2.5 mg total) by nebulization every 4 (four) hours.   75 mL   12   . azithromycin (ZITHROMAX) 250 MG tablet      Take 1 tab daily for 4 days   6 each   0   . gabapentin (NEURONTIN) 300 MG capsule   Oral   Take 300 mg by mouth 2 (two) times daily.      0   . glimepiride (AMARYL) 4 MG tablet   Oral   Take 4 mg by mouth 2 (two) times daily.       0   . losartan-hydrochlorothiazide (HYZAAR) 50-12.5 MG per tablet   Oral   Take 2 tablets by mouth daily.   60 tablet   0   . montelukast (SINGULAIR) 10 MG tablet   Oral   Take 1 tablet by mouth at bedtime.      0   .  predniSONE (DELTASONE) 10 MG tablet   Oral   Take 6 tablets (60 mg total) by mouth daily with breakfast. Day 1 take 60 mg Day 2 50 mg Day 3 40 mg Day 4 30 mg Day 5 20 mg Day 6 10 mg then stop   21 tablet   0   . SYMBICORT 160-4.5 MCG/ACT inhaler   Inhalation   Inhale 2 puffs into the lungs every 12 (twelve) hours.   1 Inhaler   3     Dispense as written.   . tiotropium (SPIRIVA) 18 MCG inhalation capsule   Inhalation   Place 18 mcg into inhaler and inhale daily.           Allergies Percocet  Family History  Problem Relation Age of Onset  . Asthma Mother   . Asthma Sister     Social History Social History  Substance Use Topics  . Smoking status: Current Every Day Smoker -- 1.00 packs/day for 23 years    Types: Cigarettes  . Smokeless tobacco: Never Used  . Alcohol Use: Yes    Review of Systems  Constitutional: Negative for fever. Eyes: Negative for visual changes. ENT: Negative for sore throat. Cardiovascular:  Negative for chest pain. Respiratory: Positive for shortness of breath, wheezing, cough Gastrointestinal: Negative for abdominal pain, vomiting and diarrhea. Genitourinary: Negative for dysuria. Musculoskeletal: Negative for back pain. Skin: Negative for rash. Neurological: Negative for headaches, focal weakness or numbness.  10-point ROS otherwise negative.  ____________________________________________   PHYSICAL EXAM:  VITAL SIGNS: ED Triage Vitals  Enc Vitals Group     BP 10/06/15 0110 165/119 mmHg     Pulse Rate 10/06/15 0110 130     Resp 10/06/15 0110 38     Temp 10/06/15 0110 98.3 F (36.8 C)     Temp Source 10/06/15 0110 Axillary     SpO2 10/06/15 0110 98 %     Weight --      Height --      Head Cir --      Peak Flow --      Pain Score --      Pain Loc --      Pain Edu? --      Excl. in GC? --      Constitutional: Alert and oriented. Well appearing and in no distress. Eyes: Conjunctivae are normal. PERRL. Normal  extraocular movements. ENT   Head: Normocephalic and atraumatic.   Nose: No congestion/rhinnorhea.   Mouth/Throat: Mucous membranes are moist.   Neck: No stridor. Hematological/Lymphatic/Immunilogical: No cervical lymphadenopathy. Cardiovascular: Normal rate, regular rhythm. Normal and symmetric distal pulses are present in all extremities. No murmurs, rubs, or gallops. Respiratory: Tripod position, tachypnea, accessory muscle use speaking one word phrases. Gastrointestinal: Soft and nontender. No distention. There is no CVA tenderness. Genitourinary: deferred Musculoskeletal: Nontender with normal range of motion in all extremities. No joint effusions.  No lower extremity tenderness nor edema. Neurologic:  Normal speech and language. No gross focal neurologic deficits are appreciated. Speech is normal.  Skin:  Skin is warm, dry and intact. No rash noted. Psychiatric: Mood and affect are normal. Speech and behavior are normal. Patient exhibits appropriate insight and judgment.  ____________________________________________    LABS (pertinent positives/negatives)  Labs Reviewed  CBC - Abnormal; Notable for the following:    WBC 12.2 (*)    RBC 5.33 (*)    HCT 48.2 (*)    All other components within normal limits  COMPREHENSIVE METABOLIC PANEL - Abnormal; Notable for the following:    Glucose, Bld 163 (*)    BUN 21 (*)    Creatinine, Ser 1.54 (*)    GFR calc non Af Amer 38 (*)    GFR calc Af Amer 44 (*)    All other components within normal limits  BLOOD GAS, ARTERIAL - Abnormal; Notable for the following:    pH, Arterial 7.31 (*)    pCO2 arterial 52 (*)    pO2, Arterial 190 (*)    Allens test (pass/fail) POSITIVE (*)    All other components within normal limits  TROPONIN I  BLOOD GAS, ARTERIAL     RADIOLOGY     DG Chest Portable 1 View (Final result) Result time: 10/06/15 01:59:10   Final result by Rad Results In Interface (10/06/15 01:59:10)   Narrative:    CLINICAL DATA: Acute onset shortness of breath and wheezing. Initial encounter.  EXAM: PORTABLE CHEST 1 VIEW  COMPARISON: Chest radiograph from 09/02 08/30/2015  FINDINGS: The lung bases are incompletely imaged on this study. Pulmonary vascularity is at the upper limits of normal. No definite pleural effusion or pneumothorax is seen.  The cardiomediastinal silhouette is normal in size. No acute osseous abnormalities  are identified.  IMPRESSION: No acute cardiopulmonary process seen.   Electronically Signed By: Roanna Raider M.D. On: 10/06/2015 01:59          Critical Care performed: CRITICAL CARE Performed by: Bayard Males N   Total critical care time: 60 minutes  Critical care time was exclusive of separately billable procedures and treating other patients.  Critical care was necessary to treat or prevent imminent or life-threatening deterioration.  Critical care was time spent personally by me on the following activities: development of treatment plan with patient and/or surrogate as well as nursing, discussions with consultants, evaluation of patient's response to treatment, examination of patient, obtaining history from patient or surrogate, ordering and performing treatments and interventions, ordering and review of laboratory studies, ordering and review of radiographic studies, pulse oximetry and re-evaluation of patient's condition.   ____________________________________________   INITIAL IMPRESSION / ASSESSMENT AND PLAN / ED COURSE  Pertinent labs & imaging results that were available during my care of the patient were reviewed by me and considered in my medical decision making (see chart for details).  BiPAP applied and the patient on presentation to the emergency department with continuous belies bronchodilators (albuterol). Patient also received 2 g magnesium IV. Patient discussed with Dr. Sheryle Hail for hospital admission for further  management for acute respiratory failure __________________________________   FINAL CLINICAL IMPRESSION(S) / ED DIAGNOSES  Final diagnoses:  Acute respiratory failure with hypoxia [facility])      Darci Current, MD 10/06/15 [phone]

## 2015-10-06 NOTE — Progress Notes (Signed)
Patient's nurse called to have BIPAP placed back on patient.  Patient with  increased work of breathing RR 35. BIPAP placed back on previous settings.

## 2015-10-06 NOTE — Progress Notes (Signed)
Initial Nutrition Assessment    INTERVENTION:   Meals and Snacks: Cater to patient preferences Education: pt verbalized understanding of current diet order   NUTRITION DIAGNOSIS:   No nutrition diagnosis  GOAL:   Patient will meet greater than or equal to 90% of their needs  MONITOR:    (Energy Intake, Anthropometrics, Electrolyte/Renal Profile, Digestive System)  REASON FOR ASSESSMENT:   Consult Assessment of nutrition requirement/status  ASSESSMENT:    Pt admitted with acute on chronic COPD exacerbation, currently on HFNC  Past Medical History  Diagnosis Date  . Asthma   . Hypertension   . Diabetes mellitus without complication (HCC)   . COPD (chronic obstructive pulmonary disease) (HCC)      Diet Order:  Diet heart healthy/carb modified Room service appropriate?: Yes; Fluid consistency:: Thin   Energy Intake: appetite good, pt awaiting breakfast tray on visit this AM  Food and Nutrition Related History: appetite good prior to admission  Electrolyte and Renal Profile:  Recent Labs Lab 10/06/15 0130  BUN 21*  CREATININE 1.54*  NA 139  K 4.4   Glucose Profile:   Recent Labs  10/06/15 0718 10/06/15 1132  GLUCAP 216* 226*   Meds: NS at 50 ml/hr, ss novolog, prednisone  Height:   Ht Readings from Last 1 Encounters:  10/06/15 5\' 7"  (1.702 m)    Weight: reports wt has been stable  Wt Readings from Last 1 Encounters:  10/06/15 272 lb 4.3 oz (123.5 kg)    BMI:  Body mass index is 42.63 kg/(m^2).   EDUCATION NEEDS:   Education needs addressed   LOW Care Level  Romelle StarcherCate Caliann Leckrone MS, [place]RD, LDN [phone] Pager

## 2015-10-06 NOTE — Progress Notes (Signed)
OT Cancellation Note  Patient Details Name: Samantha [name] MRN: [id number] DOB: 06/25/1966   Cancelled Treatment:    Reason Eval/Treat Not Completed: Medical issues which prohibited therapy. Is transitioning to high flow 02.  Ocie CornfieldHuff, [name] 10/06/2015, 9:41 AM

## 2015-10-06 NOTE — Progress Notes (Signed)
Within 10 min pt work of breathing increased and had to be placed back on bipap, will contnue to monitor

## 2015-10-06 NOTE — Progress Notes (Signed)
[facility] [place] at [facility]   PATIENT NAME: Samantha [name]    MR#:  [id number]  DATE OF BIRTH:  [birth year]  SUBJECTIVE:  CHIEF COMPLAINT:  Pt is feeling slightly better, on high flow oxygen  REVIEW OF SYSTEMS:  CONSTITUTIONAL: No fever, fatigue or weakness.  EYES: No blurred or double vision.  EARS, NOSE, AND THROAT: No tinnitus or ear pain.  RESPIRATORY: No cough, shortness of breath with min exertion , no  hemoptysis.  CARDIOVASCULAR: No chest pain, orthopnea, edema.  GASTROINTESTINAL: No nausea, vomiting, diarrhea or abdominal pain.  GENITOURINARY: No dysuria, hematuria.  ENDOCRINE: No polyuria, nocturia,  HEMATOLOGY: No anemia, easy bruising or bleeding SKIN: No rash or lesion. MUSCULOSKELETAL: No joint pain or arthritis.   NEUROLOGIC: No tingling, numbness, weakness.  PSYCHIATRY: No anxiety or depression.   DRUG ALLERGIES:   Allergies  Allergen Reactions  . Percocet [Oxycodone-Acetaminophen] Itching    VITALS:  Blood pressure 139/92, pulse 99, temperature 97.9 F (36.6 C), temperature source Axillary, resp. rate 21, height  (1.702 m), weight 123.5 kg (272 lb 4.3 oz), SpO2 99 %.  PHYSICAL EXAMINATION:  GENERAL:  49 y.o.-year-old patient lying in the bed with no acute distress.  EYES: Pupils equal, round, reactive to light and accommodation. No scleral icterus. Extraocular muscles intact.  HEENT: Head atraumatic, normocephalic. Oropharynx and nasopharynx clear.  NECK:  Supple, no jugular venous distention. No thyroid enlargement, no tenderness.  LUNGS:  moderate air entry with coarse bronchial breath sounds bilaterally, minimal end expiratory  wheezing,  no rales,rhonchi or crepitation. No use of accessory muscles of respiration.  CARDIOVASCULAR: S1, S2 normal. No murmurs, rubs, or gallops.  ABDOMEN: Soft, nontender, nondistended. Bowel sounds present. No organomegaly or mass.  EXTREMITIES: No pedal edema, cyanosis, or clubbing.   NEUROLOGIC: Cranial nerves II through XII are intact. Muscle strength 5/5 in all extremities. Sensation intact. Gait not checked.  PSYCHIATRIC: The patient is alert and oriented x 3.  SKIN: No obvious rash, lesion, or ulcer.    LABORATORY PANEL:   CBC  Recent Labs Lab 10/06/15 0130  WBC 12.2*  HGB 15.5  HCT 48.2*  PLT 315   ------------------------------------------------------------------------------------------------------------------  Chemistries   Recent Labs Lab 10/06/15 0130  NA 139  K 4.4  CL 107  CO2 23  GLUCOSE 163*  BUN 21*  CREATININE 1.54*  CALCIUM 9.9  AST 39  ALT 54  ALKPHOS 82  BILITOT 0.5   ------------------------------------------------------------------------------------------------------------------  Cardiac Enzymes  Recent Labs Lab 10/06/15 0130  TROPONINI <0.03   ------------------------------------------------------------------------------------------------------------------  RADIOLOGY:  Dg Chest Portable 1 View  10/06/2015  CLINICAL DATA:  Acute onset shortness of breath and wheezing. Initial encounter. EXAM: PORTABLE CHEST 1 VIEW COMPARISON:  Chest radiograph from 09/02 08/30/2015 FINDINGS: The lung bases are incompletely imaged on this study. Pulmonary vascularity is at the upper limits of normal. No definite pleural effusion or pneumothorax is seen. The cardiomediastinal silhouette is normal in size. No acute osseous abnormalities are identified. IMPRESSION: No acute cardiopulmonary process seen. Electronically Signed   By: Roanna Raider M.D.   On: 10/06/2015 01:59    EKG:   Orders placed or performed during the hospital encounter of 08/30/15  . EKG 12-Lead  . EKG 12-Lead  . EKG    ASSESSMENT AND PLAN:   This is a 49 year old African American female admitted for acute on chronic respiratory failure with hypercapnia. 1. Acute on chronic respiratory failure: Hypercapnic with respiratory acidosis . Patient is currently off  BiPAP, on high flow oxygen   Continue  Albuterol every 4 as needed for wheezing and shortness of breath.  Continue  patient on azithromycin and will continue a steroid taper 2. COPD: Exacerbation; on steroids.  continue Symbicort and Spiriva. Pulmonology consult is placed as the patient meets COPD Gold criteria  3. Diabetes mellitus type 2: Hold oral hypoglycemics. Sliding scale insulin while hospitalized 4. Tobacco abuse: Nicotine patch 5. Obesity: BMI is 42.7; encouraged healthy diet and exercise 6. DVT prophylaxis: Heparin 7. GI prophylaxis: None    All the records are reviewed and case discussed with Care Management/Social Workerr. Management plans discussed with the patient, family and they are in agreement.  CODE STATUS: FULL CODE  TOTAL CRITICAL CARE  TIME TAKING CARE OF THIS PATIENT: 35  minutes.   POSSIBLE D/C IN 3-4 DAYS, DEPENDING ON CLINICAL CONDITION.   Ramonita LabGouru, Kirt Chew M.D on 10/06/2015 at 2:19 PM  Between 7am to 6pm - Pager - [phone] After 6pm go to [email or web address] - password [email or web address] [email or web address]  [facility]  Office  [phone]  CC: Primary care physician; Stephanie AcreMUNGAL,VISHAL, MD

## 2015-10-06 NOTE — Consult Note (Signed)
PULMONARY / CRITICAL CARE MEDICINE   Name: Samantha [name] MRN: [id number] DOB: 1966-02-15    ADMISSION DATE:  10/06/2015 CONSULTATION DATE:  10/06/15  REFERRING MD :  Dr. Sheryle Hail   CHIEF COMPLAINT:   Difficulty breathing   HISTORY OF PRESENT ILLNESS   Patient presents emergency department complaining of shortness of breath. She has become progressively more dyspneic over the last 1-2 day after being exposed to 4 year grand daughter with a possible URI. Over the last day she has been coughing more and bringing up scant amounts of yellow sputum. She denies fevers, chest pain, nausea or vomiting. En route to the emergency department the patient received Solu-Medrol. Upon arrival she was given magnesium and placed on BiPAP. Patient states that she feels better now. Transition this morning from bipap to HFNC and maintaining sats, but still with wheezing. Still continues to smoke    SIGNIFICANT EVENTS     PAST MEDICAL HISTORY    :  Past Medical History  Diagnosis Date  . Asthma   . Hypertension   . Diabetes mellitus without complication (HCC)   . COPD (chronic obstructive pulmonary disease) [facility])    Past Surgical History  Procedure Laterality Date  . None     Prior to Admission medications   Medication Sig Start Date End Date Taking? Authorizing Provider  albuterol (PROVENTIL HFA;VENTOLIN HFA) 108 (90 BASE) MCG/ACT inhaler Inhale 4-6 puffs by mouth every 4 hours as needed for wheezing, cough, and/or shortness of breath Patient taking differently: Inhale 2 puffs into the lungs every 6 (six) hours as needed for wheezing or shortness of breath.  06/16/15  Yes Loleta Rose, MD  gabapentin (NEURONTIN) 300 MG capsule Take 300 mg by mouth 2 (two) times daily.   Yes Historical Provider, MD  glimepiride (AMARYL) 4 MG tablet Take 2 mg by mouth 2 (two) times daily.    Yes Historical Provider, MD  losartan-hydrochlorothiazide (HYZAAR) 50-12.5 MG per tablet Take 2 tablets by mouth  daily. Patient taking differently: Take 1 tablet by mouth daily.  08/31/15  Yes Enedina Finner, MD  montelukast (SINGULAIR) 10 MG tablet Take 1 tablet by mouth at bedtime.   Yes Historical Provider, MD  SYMBICORT 160-4.5 MCG/ACT inhaler Inhale 2 puffs into the lungs every 12 (twelve) hours. 08/31/15  Yes Enedina Finner, MD   Allergies  Allergen Reactions  . Percocet [Oxycodone-Acetaminophen] Itching     FAMILY HISTORY   Family History  Problem Relation Age of Onset  . Asthma Mother   . Asthma Sister       SOCIAL HISTORY    reports that she has been smoking Cigarettes.  She has a 23 pack-year smoking history. She has never used smokeless tobacco. She reports that she drinks alcohol. She reports that she does not use illicit drugs.  Review of Systems  Constitutional: Negative for fever and chills.  HENT: Negative for hearing loss.   Eyes: Negative for blurred vision and double vision.  Respiratory: Positive for cough, sputum production, shortness of breath and wheezing.   Cardiovascular: Negative for chest pain and leg swelling.  Gastrointestinal: Negative for heartburn and nausea.  Genitourinary: Negative for dysuria.  Musculoskeletal: Negative for myalgias.  Neurological: Negative for headaches.  Endo/Heme/Allergies: Does not bruise/bleed easily.      VITAL SIGNS    Temp:  [96.7 F (35.9 C)-98.3 F (36.8 C)] 97.3 F (36.3 C) (10/25 0700) Pulse Rate:  [74-130] 99 (10/25 0900) Resp:  [16-38] 16 (10/25 0900) BP: (133-168)/(58-119) 143/91 mmHg (  10/25 0900) SpO2:  [96 %-100 %] 99 % (10/25 0921) FiO2 (%):  [30 %-40 %] 40 % (10/25 0921) Weight:  [272 lb 4.3 oz (123.5 kg)] 272 lb 4.3 oz (123.5 kg) (10/25 0407) HEMODYNAMICS:   VENTILATOR SETTINGS: Vent Mode:  [-]  FiO2 (%):  [30 %-40 %] 40 % INTAKE / OUTPUT:  Intake/Output Summary (Last 24 hours) at 10/06/15 0925 Last data filed at 10/06/15 0800  Gross per 24 hour  Intake      0 ml  Output    300 ml  Net   -300 ml        PHYSICAL EXAM   Physical Exam  Constitutional: She is oriented to person, place, and time. She appears well-developed and well-nourished.  HENT:  Head: Normocephalic and atraumatic.  Eyes: Pupils are equal, round, and reactive to light.  Neck: Normal range of motion.  Cardiovascular: Normal rate, normal heart sounds and intact distal pulses.   Pulmonary/Chest: She has wheezes.  Mild resp distress Anterior upper lobe wheezes (mostly expiratory)  Abdominal: Soft. She exhibits no distension.  Musculoskeletal: Normal range of motion.  Neurological: She is alert and oriented to person, place, and time.  Skin: Skin is dry.  Nursing note and vitals reviewed.      LABS   LABS:  CBC  Recent Labs Lab 10/06/15 0130  WBC 12.2*  HGB 15.5  HCT 48.2*  PLT 315   Coag's No results for input(s): APTT, INR in the last 168 hours. BMET  Recent Labs Lab 10/06/15 0130  NA 139  K 4.4  CL 107  CO2 23  BUN 21*  CREATININE 1.54*  GLUCOSE 163*   Electrolytes  Recent Labs Lab 10/06/15 0130  CALCIUM 9.9   Sepsis Markers No results for input(s): LATICACIDVEN, PROCALCITON, O2SATVEN in the last 168 hours. ABG  Recent Labs Lab 10/06/15 0155  PHART 7.31*  PCO2ART 52*  PO2ART 190*   Liver Enzymes  Recent Labs Lab 10/06/15 0130  AST 39  ALT 54  ALKPHOS 82  BILITOT 0.5  ALBUMIN 4.2   Cardiac Enzymes  Recent Labs Lab 10/06/15 0130  TROPONINI <0.03   Glucose  Recent Labs Lab 10/06/15 0718  GLUCAP 216*     No results found for this or any previous visit (from the past 240 hour(s)).   Current facility-administered medications:  .  0.9 %  sodium chloride infusion, , Intravenous, Continuous, Arnaldo NatalMichael S Diamond, MD, Last Rate: 100 mL/hr at 10/06/15 0452 .  acetaminophen (TYLENOL) tablet 650 mg, 650 mg, Oral, Q6H PRN **OR** acetaminophen (TYLENOL) suppository 650 mg, 650 mg, Rectal, Q6H PRN, Arnaldo NatalMichael S Diamond, MD .  albuterol (PROVENTIL) (2.5 MG/3ML)  0.083% nebulizer solution 2.5 mg, 2.5 mg, Nebulization, Q4H, Arnaldo NatalMichael S Diamond, MD, 2.5 mg at 10/06/15 16100742 .  antiseptic oral rinse (CPC / CETYLPYRIDINIUM CHLORIDE 0.05%) solution 7 mL, 7 mL, Mouth Rinse, BID, Arnaldo NatalMichael S Diamond, MD .  [address][COMPLETED] azithromycin [facility](ZITHROMAX) tablet 500 mg, 500 mg, Oral, Daily, 500 mg at 10/06/15 [id number] **FOLLOWED BY** [START ON 10/07/2015] azithromycin (ZITHROMAX) tablet 250 mg, 250 mg, Oral, Daily, Arnaldo NatalMichael S Diamond, MD .  budesonide-formoterol [facility](SYMBICORT) 160-4.5 MCG/ACT inhaler 2 puff, 2 puff, Inhalation, Q12H, Arnaldo NatalMichael S Diamond, MD .  docusate sodium (COLACE) capsule 100 mg, 100 mg, Oral, BID, Arnaldo NatalMichael S Diamond, MD .  gabapentin (NEURONTIN) capsule 300 mg, 300 mg, Oral, BID, Arnaldo NatalMichael S Diamond, MD .  heparin injection 5,000 Units, 5,000 Units, Subcutaneous, 3 times per day, Arnaldo NatalMichael S Diamond, MD, 5,000  Units at 10/06/15 0621 .  losartan (COZAAR) tablet 100 mg, 100 mg, Oral, Daily **AND** hydrochlorothiazide (HYDRODIURIL) tablet 25 mg, 25 mg, Oral, Daily, Arnaldo Natal, MD .  insulin aspart (novoLOG) injection 0-15 Units, 0-15 Units, Subcutaneous, TID WC, Arnaldo Natal, MD, 5 Units at 10/06/15 0820 .  insulin aspart (novoLOG) injection 0-5 Units, 0-5 Units, Subcutaneous, QHS, Arnaldo Natal, MD .  LORazepam (ATIVAN) injection 1 mg, 1 mg, Intravenous, Q4H PRN, Arnaldo Natal, MD .  montelukast (SINGULAIR) tablet 10 mg, 10 mg, Oral, QHS, Arnaldo Natal, MD .  nicotine (NICODERM CQ - dosed in mg/24 hours) patch 21 mg, 21 mg, Transdermal, Daily, Arnaldo Natal, MD .  ondansetron [facility]) tablet 4 mg, 4 mg, Oral, Q6H PRN **OR** ondansetron (ZOFRAN) injection 4 mg, 4 mg, Intravenous, Q6H PRN, Arnaldo Natal, MD .  predniSONE (DELTASONE) tablet 60 mg, 60 mg, Oral, Q breakfast, Arnaldo Natal, MD, 60 mg at 10/06/15 0800 .  sodium chloride 0.9 % injection 3 mL, 3 mL, Intravenous, Q12H, Arnaldo Natal, MD .  tiotropium [facility]) inhalation capsule 18  mcg, 18 mcg, Inhalation, Daily, Arnaldo Natal, MD, 18 mcg at 10/06/15 0800  IMAGING    Dg Chest Portable 1 View  10/06/2015  CLINICAL DATA:  Acute onset shortness of breath and wheezing. Initial encounter. EXAM: PORTABLE CHEST 1 VIEW COMPARISON:  Chest radiograph from 09/02 08/30/2015 FINDINGS: The lung bases are incompletely imaged on this study. Pulmonary vascularity is at the upper limits of normal. No definite pleural effusion or pneumothorax is seen. The cardiomediastinal silhouette is normal in size. No acute osseous abnormalities are identified. IMPRESSION: No acute cardiopulmonary process seen. Electronically Signed   By: Roanna Raider M.D.   On: 10/06/2015 01:59      Indwelling Urinary Catheter continued, requirement due to   Reason to continue Indwelling Urinary Catheter for strict Intake/Output monitoring for hemodynamic instability   Central Line continued, requirement due to   Reason to continue [employer] Monitoring of central venous pressure or other hemodynamic parameters   Ventilator continued, requirement due to, resp failure    Ventilator Sedation RASS 0 to -2      ASSESSMENT/PLAN  49 year old African American female admitted for acute on chronic respiratory failure with hypercapnia.   AECOPD COPD Respiratory Distress Tobacco abuse P:   -Most likely secondary to recent URI exposure from granddaughter -Continue with PO steroids, antibiotics, BiPAP as tolerated -Currently on high flow nasal cannula, will continue intermittently between BiPAP -Schedule bronchodilators, when necessary bronchodilators, Pulmicort nebulizer. -Check sputum culture -Maintain O2 saturations greater than 88% -Consider tapering steroids over a two-week period -Patient counseled on tobacco cessation -Follows with Dr. Welton Flakes as an outpatient, please schedule follow up appointment with his office.    INFECTIOUS A:  AECOPD P:   - cont with azithromycin  Cultures Sputum  10/25>>  Antibiotics Azithro 10/25>>   I have personally obtained a history, examined the patient, evaluated laboratory and imaging results, formulated the assessment and plan and placed orders.  The Patient requires high complexity decision making for assessment and support, frequent evaluation and titration of therapies, application of advanced monitoring technologies and extensive interpretation of multiple databases. Pulmonary consult Time devoted to patient care services described in this note is 45 minutes.   Overall, patient is critically ill, prognosis is guarded. Patient at high risk for cardiac arrest and death.   Stephanie Acre, MD [facility] Pager [phone] (please  enter 7-digits) On Call Pager - [phone] (please enter 7-digits)     10/06/2015, 9:25 AM

## 2015-10-06 NOTE — H&P (Signed)
Samantha [name] is an 49 y.o. female.   Chief Complaint: Shortness of breath HPI: She presents emergency department complaining of shortness of breath. She has become progressively more dyspneic over the last 24 hours. She admits that her 71-year-old niece has been sick with what appears to be a viral upper respiratory illness. Notably, the patient also continues to smoke. Over the last day she has been coughing more and bringing up scant amounts of yellow sputum. She denies fevers, chest pain, nausea or vomiting. En route to the emergency department the patient received Solu-Medrol. Upon arrival she was given magnesium and placed on BiPAP. She states she feels better now but respiratory rate is still high and significant wheezing is present which prompted the emergency department staff to call for admission.  Past Medical History  Diagnosis Date  . Asthma   . Hypertension   . Diabetes mellitus without complication ([place])   . COPD (chronic obstructive pulmonary disease) [facility])     Past Surgical History  Procedure Laterality Date  . None      Family History  Problem Relation Age of Onset  . Asthma Mother   . Asthma Sister    Social History:  reports that she has been smoking Cigarettes.  She has a 23 pack-year smoking history. She has never used smokeless tobacco. She reports that she drinks alcohol. She reports that she does not use illicit drugs.  Allergies:  Allergies  Allergen Reactions  . Percocet [Oxycodone-Acetaminophen] Itching    Prior to Admission medications   Medication Sig Start Date End Date Taking? Authorizing Provider  albuterol (PROVENTIL HFA;VENTOLIN HFA) 108 (90 BASE) MCG/ACT inhaler Inhale 4-6 puffs by mouth every 4 hours as needed for wheezing, cough, and/or shortness of breath Patient taking differently: Inhale 2 puffs into the lungs every 6 (six) hours as needed for wheezing or shortness of breath.  06/16/15  Yes Hinda Kehr, MD  gabapentin (NEURONTIN) 300 MG  capsule Take 300 mg by mouth 2 (two) times daily.   Yes Historical Provider, MD  glimepiride (AMARYL) 4 MG tablet Take 2 mg by mouth 2 (two) times daily.    Yes Historical Provider, MD  losartan-hydrochlorothiazide (HYZAAR) 50-12.5 MG per tablet Take 2 tablets by mouth daily. Patient taking differently: Take 1 tablet by mouth daily.  08/31/15  Yes Fritzi Mandes, MD  montelukast (SINGULAIR) 10 MG tablet Take 1 tablet by mouth at bedtime.   Yes Historical Provider, MD  SYMBICORT 160-4.5 MCG/ACT inhaler Inhale 2 puffs into the lungs every 12 (twelve) hours. 08/31/15  Yes Fritzi Mandes, MD     Results for orders placed or performed during the hospital encounter of 10/06/15 (from the past 48 hour(s))  CBC     Status: Abnormal   Collection Time: 10/06/15  1:30 AM  Result Value Ref Range   WBC 12.2 (H) 3.6 - 11.0 K/uL   RBC 5.33 (H) 3.80 - 5.20 MIL/uL   Hemoglobin 15.5 12.0 - 16.0 g/dL   HCT 48.2 (H) 35.0 - 47.0 %   MCV 90.3 80.0 - 100.0 fL   MCH 29.1 26.0 - 34.0 pg   MCHC 32.2 32.0 - 36.0 g/dL   RDW 14.1 11.5 - 14.5 %   Platelets 315 150 - 440 K/uL  Comprehensive metabolic panel     Status: Abnormal   Collection Time: 10/06/15  1:30 AM  Result Value Ref Range   Sodium 139 135 - 145 mmol/L   Potassium 4.4 3.5 - 5.1 mmol/L   Chloride  107 101 - 111 mmol/L   CO2 23 22 - 32 mmol/L   Glucose, Bld 163 (H) 65 - 99 mg/dL   BUN 21 (H) 6 - 20 mg/dL   Creatinine, Ser 1.54 (H) 0.44 - 1.00 mg/dL   Calcium 9.9 8.9 - 10.3 mg/dL   Total Protein 7.5 6.5 - 8.1 g/dL   Albumin 4.2 3.5 - 5.0 g/dL   AST 39 15 - 41 U/L   ALT 54 14 - 54 U/L   Alkaline Phosphatase 82 38 - 126 U/L   Total Bilirubin 0.5 0.3 - 1.2 mg/dL   GFR calc non Af Amer 38 (L) >60 mL/min   GFR calc Af Amer 44 (L) >60 mL/min    Comment: (NOTE) The eGFR has been calculated using the CKD EPI equation. This calculation has not been validated in all clinical situations. eGFR's persistently <60 mL/min signify possible Chronic Kidney Disease.     Anion gap 9 5 - 15  Troponin I     Status: None   Collection Time: 10/06/15  1:30 AM  Result Value Ref Range   Troponin I <0.03 <0.031 ng/mL    Comment:        NO INDICATION OF MYOCARDIAL INJURY.   Blood gas, arterial ([facility] & [facility] ONLY)     Status: Abnormal   Collection Time: 10/06/15  1:55 AM  Result Value Ref Range   FIO2 0.40    Delivery systems BILEVEL POSITIVE AIRWAY PRESSURE    Inspiratory PAP 12    Expiratory PAP 6    pH, Arterial 7.31 (L) 7.350 - 7.450   pCO2 arterial 52 (H) 32.0 - 48.0 mmHg   pO2, Arterial 190 (H) 83.0 - 108.0 mmHg   Bicarbonate 26.2 21.0 - 28.0 mEq/L   Acid-base deficit 0.8 0.0 - 2.0 mmol/L   O2 Saturation 99.6 %   Patient temperature 37.0    Collection site RIGHT RADIAL    Sample type ARTERIAL DRAW    Allens test (pass/fail) POSITIVE (A) PASS   Dg Chest Portable 1 View  10/06/2015  CLINICAL DATA:  Acute onset shortness of breath and wheezing. Initial encounter. EXAM: PORTABLE CHEST 1 VIEW COMPARISON:  Chest radiograph from 09/02 08/30/2015 FINDINGS: The lung bases are incompletely imaged on this study. Pulmonary vascularity is at the upper limits of normal. No definite pleural effusion or pneumothorax is seen. The cardiomediastinal silhouette is normal in size. No acute osseous abnormalities are identified. IMPRESSION: No acute cardiopulmonary process seen. Electronically Signed   By: Garald Balding M.D.   On: 10/06/2015 01:59    Review of Systems  Constitutional: Negative for fever and chills.  HENT: Negative for sore throat and tinnitus.   Eyes: Negative for blurred vision and redness.  Respiratory: Positive for cough, sputum production, shortness of breath and wheezing.   Cardiovascular: Negative for chest pain, palpitations, orthopnea and PND.  Gastrointestinal: Negative for nausea, vomiting, abdominal pain and diarrhea.  Genitourinary: Negative for dysuria, urgency and frequency.  Musculoskeletal: Negative for myalgias and joint pain.  Skin:  Negative for rash.       No lesions  Neurological: Negative for speech change, focal weakness and weakness.  Endo/Heme/Allergies: Does not bruise/bleed easily.       No temperature intolerance  Psychiatric/Behavioral: Negative for depression and suicidal ideas.    Blood pressure 134/87, pulse 101, temperature 98.3 F (36.8 C), temperature source Axillary, resp. rate 18, SpO2 99 %. Physical Exam  Nursing note and vitals reviewed. Constitutional: She is oriented to  person, place, and time. She appears well-developed and well-nourished. No distress.  HENT:  Head: Normocephalic and atraumatic.  Mouth/Throat: Oropharynx is clear and moist.  Eyes: Conjunctivae and EOM are normal. Pupils are equal, round, and reactive to light. No scleral icterus.  Neck: Normal range of motion. Neck supple. No JVD present. No tracheal deviation present. No thyromegaly present.  Cardiovascular: Normal rate, regular rhythm and normal heart sounds.  Exam reveals no gallop and no friction rub.   No murmur heard. Respiratory: Effort normal and breath sounds normal.  GI: Soft. Bowel sounds are normal. She exhibits no distension. There is no tenderness.  Genitourinary:  Deferred  Musculoskeletal: Normal range of motion. She exhibits no edema.  Lymphadenopathy:    She has no cervical adenopathy.  Neurological: She is alert and oriented to person, place, and time. No cranial nerve deficit. She exhibits normal muscle tone.  Skin: Skin is warm and dry.  Psychiatric: She has a normal mood and affect. Her behavior is normal. Judgment and thought content normal.     Assessment/Plan This is a 49 year old [place] American female admitted for acute on chronic respiratory failure with hypercapnia. 1. Acute on chronic respiratory failure: Hypercapnic with respiratory acidosis and compensatory metabolic alkalosis. Patient is currently on BiPAP which should decrease her CO2. Albuterol every 4 as needed for wheezing and  shortness of breath. I have started the patient on azithromycin and will continue a steroid taper 2. COPD: Exacerbation; continue Symbicort and Spiriva. 3. Diabetes mellitus type 2: Hold oral hypoglycemics.  Sliding scale insulin while hospitalized 4. Tobacco abuse: Nicotine patch 5. Obesity: BMI is 42.7; encouraged healthy diet and exercise 6. DVT prophylaxis: Heparin 7. GI prophylaxis: None The patient is a full code. Time spent on admission and critical patient care approximately 35 minutes  Harrie Foreman 10/06/2015, 3:55 AM

## 2015-10-07 LAB — CBC
HEMATOCRIT: 43.4 % (ref 35.0–47.0)
Hemoglobin: 13.9 g/dL (ref 12.0–16.0)
MCH: 28.7 pg (ref 26.0–34.0)
MCHC: 31.9 g/dL — AB (ref 32.0–36.0)
MCV: 89.7 fL (ref 80.0–100.0)
Platelets: 290 10*3/uL (ref 150–440)
RBC: 4.84 MIL/uL (ref 3.80–5.20)
RDW: 13.8 % (ref 11.5–14.5)
WBC: 12.4 10*3/uL — ABNORMAL HIGH (ref 3.6–11.0)

## 2015-10-07 LAB — GLUCOSE, CAPILLARY
Glucose-Capillary: 146 mg/dL — ABNORMAL HIGH (ref 65–99)
Glucose-Capillary: 196 mg/dL — ABNORMAL HIGH (ref 65–99)
Glucose-Capillary: 279 mg/dL — ABNORMAL HIGH (ref 65–99)
Glucose-Capillary: 127 mg/dL — ABNORMAL HIGH (ref 65–99)

## 2015-10-07 LAB — BASIC METABOLIC PANEL
Anion gap: 7 (ref 5–15)
BUN: 26 mg/dL — ABNORMAL HIGH (ref 6–20)
CALCIUM: 9.4 mg/dL (ref 8.9–10.3)
CO2: 26 mmol/L (ref 22–32)
CREATININE: 1.49 mg/dL — AB (ref 0.44–1.00)
Chloride: 106 mmol/L (ref 101–111)
GFR calc non Af Amer: 40 mL/min — ABNORMAL LOW (ref >60)
GFR, EST AFRICAN AMERICAN: 46 mL/min — AB (ref >60)
Glucose, Bld: 181 mg/dL — ABNORMAL HIGH (ref 65–99)
Potassium: 4.1 mmol/L (ref 3.5–5.1)
SODIUM: 139 mmol/L (ref 135–145)

## 2015-10-07 MED ORDER — POLYETHYLENE GLYCOL 3350 17 G PO PACK
17.0000 g | PACK | Freq: Every day | ORAL | Status: DC
  Administered 2015-10-07: 15:00:00 17 g via ORAL
  Filled 2015-10-07: qty 1

## 2015-10-07 MED ORDER — ENOXAPARIN SODIUM 40 MG/0.4ML ~~LOC~~ SOLN
40.0000 mg | Freq: Two times a day (BID) | SUBCUTANEOUS | Status: DC
  Administered 2015-10-07: 40 mg via SUBCUTANEOUS
  Filled 2015-10-07: qty 0.4

## 2015-10-07 NOTE — Progress Notes (Signed)
[facility] [place] [facility]   PATIENT NAME: Samantha SchimkeDeffiney [name]    MR#:  [id number]  DATE OF BIRTH:  Aug 15, 1966  SUBJECTIVE:  CHIEF COMPLAINT:   Chief Complaint  Patient presents with  . Shortness of Breath   Feeling much better, wants to go home  REVIEW OF SYSTEMS:   Review of Systems  Constitutional: Negative for fever.  Respiratory: Positive for cough, sputum production, shortness of breath and wheezing.   Cardiovascular: Negative for chest pain and palpitations.  Gastrointestinal: Negative for nausea, vomiting and abdominal pain.  Genitourinary: Negative for dysuria.    DRUG ALLERGIES:   Allergies  Allergen Reactions  . Percocet [Oxycodone-Acetaminophen] Itching    VITALS:  Blood pressure 147/94, pulse 86, temperature 98.3 F (36.8 C), temperature source Oral, resp. rate 22, height 5\' 7"  (1.702 m), weight 128.4 kg (283 lb 1.1 oz), SpO2 96 %.  PHYSICAL EXAMINATION:  GENERAL:  49 y.o.-year-old patient sitting up no distress EYES: Pupils equal, round, reactive to light and accommodation. No scleral icterus. Extraocular muscles intact.  HEENT: Head atraumatic, normocephalic. Oropharynx and nasopharynx clear.  NECK:  Supple, no jugular venous distention. No thyroid enlargement, no tenderness.  LUNGS: Diffuse wheezing, good air movement, no distress CARDIOVASCULAR: S1, S2 normal. No murmurs, rubs, or gallops.  ABDOMEN: Soft, nontender, nondistended. Bowel sounds present. No organomegaly or mass.  EXTREMITIES: No pedal edema, cyanosis, or clubbing. Pulses 2+ NEUROLOGIC: Cranial nerves II through XII are intact. Muscle strength 5/5 in all extremities. Sensation intact. Gait not checked.  PSYCHIATRIC: The patient is alert and oriented x 3. Anxious to go home SKIN: No obvious rash, lesion, or ulcer.    LABORATORY PANEL:   CBC  Recent Labs Lab 10/07/15 0538  WBC 12.4*  HGB 13.9  HCT 43.4  PLT 290    ------------------------------------------------------------------------------------------------------------------  Chemistries   Recent Labs Lab 10/06/15 0130 10/07/15 0538  NA 139 139  K 4.4 4.1  CL 107 106  CO2 23 26  GLUCOSE 163* 181*  BUN 21* 26*  CREATININE 1.54* 1.49*  CALCIUM 9.9 9.4  AST 39  --   ALT 54  --   ALKPHOS 82  --   BILITOT 0.5  --    ------------------------------------------------------------------------------------------------------------------  Cardiac Enzymes  Recent Labs Lab 10/06/15 0130  TROPONINI <0.03   ------------------------------------------------------------------------------------------------------------------  RADIOLOGY:  Dg Chest Portable 1 View  10/06/2015  CLINICAL DATA:  Acute onset shortness of breath and wheezing. Initial encounter. EXAM: PORTABLE CHEST 1 VIEW COMPARISON:  Chest radiograph from 09/02 08/30/2015 FINDINGS: The lung bases are incompletely imaged on this study. Pulmonary vascularity is at the upper limits of normal. No definite pleural effusion or pneumothorax is seen. The cardiomediastinal silhouette is normal in size. No acute osseous abnormalities are identified. IMPRESSION: No acute cardiopulmonary process seen. Electronically Signed   By: Roanna RaiderJeffery  Chang M.D.   On: 10/06/2015 01:59    EKG:   Orders placed or performed during the hospital encounter of 08/30/15  . EKG 12-Lead  . EKG 12-Lead  . EKG    ASSESSMENT AND PLAN:   #1 acute on chronic respiratory failure with hypercapnia - COPD exacerbation - Initially admitted to ICU for BiPAP, then high flow nasal cannula, now on 3 L (is on 2 L at home) - Appreciate pulmonology consultation - For now continue nebulizers, steroids, azithromycin - We will follow in the outpatient setting with pulmonology per COPD Gold protocol  #2 diabetes mellitus type 2 - Continue sliding scale  #3  ongoing tobacco abuse - Smoking cessation counseling provided again today.  She states that she has quit as of this admission. - Continue nicotine patch  #4 obesity: Lifestyle modification needed  #5 chronic kidney disease stage III: Stable  #6 hypertension: Blood pressure well controlled continue Cozaar and hydrochlorothiazide  All the records are reviewed and case discussed with Care Management/Social Workerr. Management plans discussed with the patient, family and they are in agreement.  CODE STATUS: full TOTAL TIME TAKING CARE OF THIS PATIENT: 25 minutes.  Greater than 50% of time spent in care coordination and counseling. Care plan discussed with patient POSSIBLE D/C IN 1 DAYS, DEPENDING ON CLINICAL CONDITION.   Elby Showers M.D on 10/07/2015 at 3:04 PM  Between 7am to 6pm - Pager - [phone]  After 6pm go to [email or web address] - password [email or web address] [facility]  [place] [facility]  Office  [phone]  CC: Primary care physician; Stephanie Acre, MD

## 2015-10-07 NOTE — Plan of Care (Signed)
Problem: Discharge Progression Outcomes Goal: Other Discharge Outcomes/Goals Outcome: Progressing Plan of care progress: -transfer from CCU today -o2 at 3L [place], Bipap at night, uses o2 at home only at night at 2L -PRN miralax given for constipation -no complaints of pain, no distress or discomfort noted -handout exitcare given to pt on COPD and respiratory distress

## 2015-10-07 NOTE — Evaluation (Signed)
Physical Therapy Evaluation Patient Details Name: Samantha [name] MRN: [id number] DOB: 21-Apr-1966 Today's Date: 10/07/2015   History of Present Illness  Pt arrived with complaints of SOB and was admitted for acute on chronic respiratory failure with hypercapnea and COPD exaccerbation. PMH: asthma, COPD, HTN, DM, and obestity. At baseline pt is fully independent for ADLs/IADLs and ambulation without assistive device in the community. Pt drives but is currently not working due to disability. Pt reports one fall in the last 12 months which occurred 2 weeks ago and resulting in swollen L ankle. Pt currently complains of minimal pain but is able to ambulate without issue per report  Clinical Impression  Pt demonstrates excellent strength and balance during evaluation. She is highly motivated to improve so that she can go home. Pt is independent with bed mobility and transfers and supervision with ambulation due to excessive leads creating a fall hazard. Pt ambulates on room air and is able to maintain adequate SaO2 with only one standing rest break required (see gait section). Pt would benefit from pulmonary rehab for both endurance as well as education per MD referral. Pt states that she will consider. Pt is also motivated to stop smoking and states she has been educated regarding quitting. Will keep patient on caseload to ensure regular ambulation to improve cardiopulmonary status.    Follow Up Recommendations No PT follow up (Pulmonary rehab as outpatient per MD referral)    Equipment Recommendations  None recommended by PT    Recommendations for Other Services       Precautions / Restrictions Precautions Precautions: None Restrictions Weight Bearing Restrictions: No      Mobility  Bed Mobility Overal bed mobility: Independent                Transfers Overall transfer level: Independent                  Ambulation/Gait Ambulation/Gait assistance:  Supervision Ambulation Distance (Feet): 80 Feet Assistive device: None Gait Pattern/deviations: WFL(Within Functional Limits)     General Gait Details: Pt ambulates without O2 approximately 29' with supervision only. SaO2 remains >90% for the majority of distance. It does drop to 89% very briefly but pt denies reports of DOE. With standing rest break SaO2 rebounds to 93% on room air. Pt is safe and steady with ambulation. Good scanning of environment and safe turns. No LOB observed. O2 donned following ambulation  Stairs            Wheelchair Mobility    Modified Rankin (Stroke Patients Only)       Balance Overall balance assessment: No apparent balance deficits (not formally assessed)                                           Pertinent Vitals/Pain Pain Assessment: No/denies pain    Home Living Family/patient expects to be discharged to:: Private residence Living Arrangements: Children Available Help at Discharge: Family Type of Home: House Home Access: Stairs to enter Entrance Stairs-Rails: None Secretary/administrator of Steps: 3 Home Layout: One level Home Equipment: None Additional Comments: Home O2 for prn use    Prior Function Level of Independence: Independent               Hand Dominance        Extremity/Trunk Assessment   Upper Extremity Assessment: Overall WFL for tasks assessed  Lower Extremity Assessment: Overall WFL for tasks assessed;LLE deficits/detail   LLE Deficits / Details: Pt complains of minimal L medial ankle pain. Mild pain with palpation over medial malleolus. Pt able to bear weight and ambulate with antalgic gait or decreased LLE WB.     Communication   Communication: No difficulties  Cognition Arousal/Alertness: Awake/alert Behavior During Therapy: WFL for tasks assessed/performed Overall Cognitive Status: Within Functional Limits for tasks assessed                      General  Comments      Exercises        Assessment/Plan    PT Assessment Patient needs continued PT services  PT Diagnosis Difficulty walking   PT Problem List Cardiopulmonary status limiting activity  PT Treatment Interventions Gait training;Therapeutic exercise;Neuromuscular re-education   PT Goals (Current goals can be found in the Care Plan section) Acute Rehab PT Goals Patient Stated Goal: "I want to go home" PT Goal Formulation: With patient Time For Goal Achievement: 10/21/15 Potential to Achieve Goals: Good    Frequency Min 2X/week   Barriers to discharge        Co-evaluation               End of Session Equipment Utilized During Treatment: Gait belt Activity Tolerance: Patient tolerated treatment well Patient left: in bed;with call bell/phone within reach Nurse Communication: Mobility status         Time: [id number] PT Time Calculation (min) (ACUTE ONLY): 15 min   Charges:   PT Evaluation $Initial PT Evaluation Tier I: 1 Procedure     PT G Codes:       Samantha [name] PT, DPT   Onofrio Klemp 10/07/2015, 9:59 AM

## 2015-10-07 NOTE — Progress Notes (Signed)
Inpatient Diabetes Program Recommendations  AACE/ADA: New Consensus Statement on Inpatient Glycemic Control (2015)  Target Ranges:  Prepandial:   less than 140 mg/dL      Peak postprandial:   less than 180 mg/dL (1-2 hours)      Critically ill patients:  140 - 180 mg/dL  [facility] (MRN [id number]) as of 10/07/2015 09:31  Ref. Range 10/06/2015 07:18 10/06/2015 11:32 10/06/2015 16:23 10/06/2015 21:42 10/07/2015 07:05  Glucose-Capillary Latest Ref Range: 65-99 mg/dL 478216 (H) 295226 (H) 621225 (H) 205 (H) 146 (H)   Review of Glycemic Control  Diabetes history: DM2 Outpatient Diabetes medications: Amaryl 2 mg BID Current orders for Inpatient glycemic control: Novolog 0-15 units TID with meals, Novolog 0-5 units HS  Inpatient Diabetes Program Recommendations: Insulin - Meal Coverage: While inpatient and ordered steroids, please consider ordering Novolog 4 units TID with meals for meal coverage (in addition to Novolog correction scale).  Thanks, Orlando PennerMarie Illona Bulman, RN, MSN, CCRN, CDE Diabetes Coordinator Inpatient Diabetes Program [phone] (Team Pager from 8am to 5pm) [phone] ([facility] office) [phone] [facility] office) [phone] [facility] office)

## 2015-10-07 NOTE — Consult Note (Signed)
   [facility] CM Inpatient Consult   10/07/2015  Shanica A Eastmond May 08, 1966 [id number]   [facility] referral received per EPIC.. I met with Mrs.Pankow at bedside to explain and offer [facility] care management services. Mrs.Townley has declined [facility] care management services, again reviewed transition of care call program. I have left [facility] pamphlet and my contact information for patient to review and contact us if needed. I will update inpatient care management. Of note, [facility] Care Management services does not replace or interfere with any services that are arranged by inpatient case management or social work.  For additional questions or referrals please contact  Joylene Draft, RN, [place] Management/Hospital Liaison [phone]- Mobile [phone]- [place]

## 2015-10-07 NOTE — Progress Notes (Signed)
Patient alert and oriented, on 3 liters. Tolerating diet and using bedside commode with 1 assist. Being tx on tele to room 114. [facility]

## 2015-10-07 NOTE — Progress Notes (Signed)
Pt requesting to be discharged if possible Dr Clent RidgesWalsh made aware and feels it would be best if pt stayed overnight, Dr Clent RidgesWalsh will see pt early morning to discharge home if she does well overnight, pt is ok with this, also made aware that pt o2 sats between 94-96% on room air with exertion and pt wanting a shower, new order to discontinue telemetry monitor and pt may shower

## 2015-10-07 NOTE — Progress Notes (Signed)
Patient in bed resting quietly. High flow nasal cannula on. Bed in low position. No signs of pain or distress.

## 2015-10-07 NOTE — Consult Note (Signed)
PULMONARY / CRITICAL CARE MEDICINE   Name: Samantha [name] MRN: [id number] DOB: 03-16-1966    ADMISSION DATE:  10/06/2015 CONSULTATION DATE:  10/06/15  REFERRING MD :  Dr. Sheryle Hail   CHIEF COMPLAINT:   Difficulty breathing   HISTORY OF PRESENT ILLNESS   Patient presents emergency department complaining of shortness of breath. She has become progressively more dyspneic over the last 1-2 day after being exposed to 4 year grand daughter with a possible URI. Over the last day she has been coughing more and bringing up scant amounts of yellow sputum. She denies fevers, chest pain, nausea or vomiting. En route to the emergency department the patient received Solu-Medrol. Upon arrival she was given magnesium and placed on BiPAP. Patient states that she feels better now. Transition this morning from bipap to HFNC and maintaining sats, but still with wheezing. Still continues to smoke  SUBJECTIVE: Patient states that she is much improved today, not as much wheezing.  Could not tolerate much of bipap lastnight due to feeling too much pressure.  Otherwise with good overall improvement.   SIGNIFICANT EVENTS     PAST MEDICAL HISTORY    :  Past Medical History  Diagnosis Date  . Asthma   . Hypertension   . Diabetes mellitus without complication (HCC)   . COPD (chronic obstructive pulmonary disease) [facility])    Past Surgical History  Procedure Laterality Date  . None     Prior to Admission medications   Medication Sig Start Date End Date Taking? Authorizing Provider  albuterol (PROVENTIL HFA;VENTOLIN HFA) 108 (90 BASE) MCG/ACT inhaler Inhale 4-6 puffs by mouth every 4 hours as needed for wheezing, cough, and/or shortness of breath Patient taking differently: Inhale 2 puffs into the lungs every 6 (six) hours as needed for wheezing or shortness of breath.  06/16/15  Yes Loleta Rose, MD  gabapentin (NEURONTIN) 300 MG capsule Take 300 mg by mouth 2 (two) times daily.   Yes Historical  Provider, MD  glimepiride (AMARYL) 4 MG tablet Take 2 mg by mouth 2 (two) times daily.    Yes Historical Provider, MD  losartan-hydrochlorothiazide (HYZAAR) 50-12.5 MG per tablet Take 2 tablets by mouth daily. Patient taking differently: Take 1 tablet by mouth daily.  08/31/15  Yes Enedina Finner, MD  montelukast (SINGULAIR) 10 MG tablet Take 1 tablet by mouth at bedtime.   Yes Historical Provider, MD  SYMBICORT 160-4.5 MCG/ACT inhaler Inhale 2 puffs into the lungs every 12 (twelve) hours. 08/31/15  Yes Enedina Finner, MD   Allergies  Allergen Reactions  . Percocet [Oxycodone-Acetaminophen] Itching     FAMILY HISTORY   Family History  Problem Relation Age of Onset  . Asthma Mother   . Asthma Sister       SOCIAL HISTORY    reports that she has been smoking Cigarettes.  She has a 23 pack-year smoking history. She has never used smokeless tobacco. She reports that she drinks alcohol. She reports that she does not use illicit drugs.  Review of Systems  Constitutional: Negative for fever and chills.  HENT: Negative for hearing loss.   Eyes: Negative for blurred vision and double vision.  Respiratory: Positive for cough and wheezing. Negative for sputum production and shortness of breath.   Cardiovascular: Negative for chest pain and leg swelling.  Gastrointestinal: Negative for heartburn and nausea.  Genitourinary: Negative for dysuria.  Musculoskeletal: Negative for myalgias.  Neurological: Negative for headaches.  Endo/Heme/Allergies: Does not bruise/bleed easily.      VITAL  SIGNS    Temp:  [97 F (36.1 C)-98.7 F (37.1 C)] 98.3 F (36.8 C) (10/26 0700) Pulse Rate:  [78-103] 78 (10/26 0700) Resp:  [14-21] 15 (10/26 0500) BP: (123-157)/(77-111) 123/84 mmHg (10/26 0700) SpO2:  [98 %-100 %] 99 % (10/26 0700) FiO2 (%):  [35 %-40 %] 36 % (10/26 0254) Weight:  [283 lb 1.1 oz (128.4 kg)] 283 lb 1.1 oz (128.4 kg) (10/26 0519) HEMODYNAMICS:   VENTILATOR SETTINGS: Vent Mode:  [-]   FiO2 (%):  [35 %-40 %] 36 % INTAKE / OUTPUT:  Intake/Output Summary (Last 24 hours) at 10/07/15 0805 Last data filed at 10/06/15 2300  Gross per 24 hour  Intake   1200 ml  Output    775 ml  Net    425 ml       PHYSICAL EXAM   Physical Exam  Constitutional: She is oriented to person, place, and time. She appears well-developed and well-nourished.  HENT:  Head: Normocephalic and atraumatic.  Eyes: Pupils are equal, round, and reactive to light.  Neck: Normal range of motion.  Cardiovascular: Normal rate, normal heart sounds and intact distal pulses.   Pulmonary/Chest: She has wheezes.  Mild resp distress Anterior upper lobe wheezes (mostly expiratory) - improving  Abdominal: Soft. She exhibits no distension.  Musculoskeletal: Normal range of motion.  Neurological: She is alert and oriented to person, place, and time.  Skin: Skin is dry.  Nursing note and vitals reviewed.      LABS   LABS:  CBC  Recent Labs Lab 10/06/15 0130 10/07/15 0538  WBC 12.2* 12.4*  HGB 15.5 13.9  HCT 48.2* 43.4  PLT 315 290   Coag's No results for input(s): APTT, INR in the last 168 hours. BMET  Recent Labs Lab 10/06/15 0130 10/07/15 0538  NA 139 139  K 4.4 4.1  CL 107 106  CO2 23 26  BUN 21* 26*  CREATININE 1.54* 1.49*  GLUCOSE 163* 181*   Electrolytes  Recent Labs Lab 10/06/15 0130 10/07/15 0538  CALCIUM 9.9 9.4   Sepsis Markers No results for input(s): LATICACIDVEN, PROCALCITON, O2SATVEN in the last 168 hours. ABG  Recent Labs Lab 10/06/15 0155  PHART 7.31*  PCO2ART 52*  PO2ART 190*   Liver Enzymes  Recent Labs Lab 10/06/15 0130  AST 39  ALT 54  ALKPHOS 82  BILITOT 0.5  ALBUMIN 4.2   Cardiac Enzymes  Recent Labs Lab 10/06/15 0130  TROPONINI <0.03   Glucose  Recent Labs Lab 10/06/15 0718 10/06/15 1132 10/06/15 1623 10/06/15 2142 10/07/15 0705  GLUCAP 216* 226* 225* 205* 146*     Recent Results (from the past 240 hour(s))   MRSA PCR Screening     Status: None   Collection Time: 10/06/15  5:00 AM  Result Value Ref Range Status   MRSA by PCR NEGATIVE NEGATIVE Final    Comment:        The GeneXpert MRSA Assay (FDA approved for NASAL specimens only), is one component of a comprehensive MRSA colonization surveillance program. It is not intended to diagnose MRSA infection nor to guide or monitor treatment for MRSA infections.      Current facility-administered medications:  .  acetaminophen (TYLENOL) tablet 650 mg, 650 mg, Oral, Q6H PRN **OR** acetaminophen (TYLENOL) suppository 650 mg, 650 mg, Rectal, Q6H PRN, Arnaldo NatalMichael S Diamond, MD .  antiseptic oral rinse (CPC / CETYLPYRIDINIUM CHLORIDE 0.05%) solution 7 mL, 7 mL, Mouth Rinse, BID, Arnaldo NatalMichael S Diamond, MD, 7 mL at 10/06/15 1000 .  [  COMPLETED] azithromycin (ZITHROMAX) tablet 500 mg, 500 mg, Oral, Daily, 500 mg at 10/06/15 0619 **FOLLOWED BY** azithromycin (ZITHROMAX) tablet 250 mg, 250 mg, Oral, Daily, Arnaldo Natal, MD, 250 mg at 10/07/15 0734 .  budesonide (PULMICORT) nebulizer solution 0.25 mg, 0.25 mg, Nebulization, BID, Abdulwahab Demelo, MD, 0.25 mg at 10/06/15 1955 .  docusate sodium (COLACE) capsule 100 mg, 100 mg, Oral, BID, Arnaldo Natal, MD, 100 mg at 10/06/15 2200 .  gabapentin (NEURONTIN) capsule 300 mg, 300 mg, Oral, BID, Arnaldo Natal, MD, 300 mg at 10/06/15 2200 .  heparin injection 5,000 Units, 5,000 Units, Subcutaneous, 3 times per day, Arnaldo Natal, MD, 5,000 Units at 10/07/15 [phone]  losartan (COZAAR) tablet 100 mg, 100 mg, Oral, Daily, 100 mg at 10/06/15 0932 **AND** hydrochlorothiazide (HYDRODIURIL) tablet 25 mg, 25 mg, Oral, Daily, Arnaldo Natal, MD, 25 mg at 10/06/15 0932 .  insulin aspart (novoLOG) injection 0-15 Units, 0-15 Units, Subcutaneous, TID WC, Arnaldo Natal, MD, 2 Units at 10/07/15 [phone] .  insulin aspart (novoLOG) injection 0-5 Units, 0-5 Units, Subcutaneous, QHS, Arnaldo Natal, MD, 2 Units at 10/06/15  2201 .  ipratropium-albuterol (DUONEB) 0.5-2.5 (3) MG/3ML nebulizer solution 3 mL, 3 mL, Nebulization, Q6H, Armonte Tortorella, MD, 3 mL at 10/07/15 0258 .  ipratropium-albuterol (DUONEB) 0.5-2.5 (3) MG/3ML nebulizer solution 3 mL, 3 mL, Nebulization, Q4H PRN, Juquan Reznick, MD .  LORazepam (ATIVAN) injection 1 mg, 1 mg, Intravenous, Q4H PRN, Arnaldo Natal, MD .  montelukast (SINGULAIR) tablet 10 mg, 10 mg, Oral, QHS, Arnaldo Natal, MD, 10 mg at 10/06/15 2205 .  nicotine (NICODERM CQ - dosed in mg/24 hours) patch 21 mg, 21 mg, Transdermal, Daily, Arnaldo Natal, MD, 21 mg at 10/06/15 0933 .  ondansetron (ZOFRAN) tablet 4 mg, 4 mg, Oral, Q6H PRN **OR** ondansetron (ZOFRAN) injection 4 mg, 4 mg, Intravenous, Q6H PRN, Arnaldo Natal, MD .  predniSONE (DELTASONE) tablet 60 mg, 60 mg, Oral, Q breakfast, Arnaldo Natal, MD, 60 mg at 10/07/15 0734 .  sodium chloride 0.9 % injection 3 mL, 3 mL, Intravenous, Q12H, Arnaldo Natal, MD, 3 mL at 10/06/15 2202  IMAGING    No results found.    Indwelling Urinary Catheter continued, requirement due to   Reason to continue Indwelling Urinary Catheter for strict Intake/Output monitoring for hemodynamic instability   Central Line continued, requirement due to   Reason to continue [employer] Monitoring of central venous pressure or other hemodynamic parameters   Ventilator continued, requirement due to, resp failure    Ventilator Sedation RASS 0 to -2      ASSESSMENT/PLAN  49 year old African American female admitted for acute on chronic respiratory failure with hypercapnia.   AECOPD COPD Respiratory Distress Tobacco abuse P:   -Most likely secondary to recent URI exposure from granddaughter -Continue with PO steroids, antibiotics, BiPAP as tolerated -Currently on high flow nasal cannula, will attempt to transition to regular cannula.  Patient will attempt to try bipap while inpt if pressure are a little lower.  -Schedule  bronchodilators, when necessary bronchodilators, Pulmicort nebulizer.  Resume home inhalers closer to discharge.  -Check sputum culture -Maintain O2 saturations greater than 88% -Consider tapering steroids over a two-week period -Patient counseled on tobacco cessation - transfer to medical floor today -Follows with Dr. Welton Flakes as an outpatient, please schedule follow up appointment with his office.    INFECTIOUS A:  AECOPD P:   - cont with azithromycin  Cultures Sputum 10/25>>  Antibiotics Azithro 10/25>>   I have personally obtained a history, examined the patient, evaluated laboratory and imaging results, formulated the assessment and plan and placed orders.  The Patient requires high complexity decision making for assessment and support, frequent evaluation and titration of therapies, application of advanced monitoring technologies and extensive interpretation of multiple databases.  Pulmonary consult Time devoted to patient care services described in this note is 35 minutes.   Stephanie Acre, MD [facility] Pager [phone] (please enter 7-digits) On Call Pager - [phone] (please enter 7-digits)     10/07/2015, 8:05 AM

## 2015-10-08 LAB — BASIC METABOLIC PANEL
Anion gap: 7 (ref 5–15)
BUN: 26 mg/dL — AB (ref 6–20)
CHLORIDE: 106 mmol/L (ref 101–111)
CO2: 27 mmol/L (ref 22–32)
CREATININE: 1.5 mg/dL — AB (ref 0.44–1.00)
Calcium: 9.3 mg/dL (ref 8.9–10.3)
GFR calc Af Amer: 46 mL/min — ABNORMAL LOW (ref >60)
GFR, EST NON AFRICAN AMERICAN: 40 mL/min — AB (ref >60)
GLUCOSE: 121 mg/dL — AB (ref 65–99)
POTASSIUM: 4.1 mmol/L (ref 3.5–5.1)
Sodium: 140 mmol/L (ref 135–145)

## 2015-10-08 LAB — CBC
HCT: 45.6 % (ref 35.0–47.0)
Hemoglobin: 14.8 g/dL (ref 12.0–16.0)
MCH: 29.2 pg (ref 26.0–34.0)
MCHC: 32.5 g/dL (ref 32.0–36.0)
MCV: 89.8 fL (ref 80.0–100.0)
PLATELETS: 292 10*3/uL (ref 150–440)
RBC: 5.08 MIL/uL (ref 3.80–5.20)
RDW: 14.2 % (ref 11.5–14.5)
WBC: 10.9 10*3/uL (ref 3.6–11.0)

## 2015-10-08 LAB — GLUCOSE, CAPILLARY: Glucose-Capillary: 129 mg/dL — ABNORMAL HIGH (ref 65–99)

## 2015-10-08 MED ORDER — PREDNISONE 10 MG (21) PO TBPK: 10.0000 mg | ORAL_TABLET | Freq: Every day | ORAL | Status: DC

## 2015-10-08 MED ORDER — PREDNISONE 20 MG PO TABS: 20.0000 mg | ORAL_TABLET | Freq: Every day | ORAL | Status: DC

## 2015-10-08 MED ORDER — AZITHROMYCIN 250 MG PO TABS
ORAL_TABLET | ORAL | Status: DC
Start: 2015-10-08 — End: 2015-12-04

## 2015-10-08 MED ORDER — TIOTROPIUM BROMIDE MONOHYDRATE 18 MCG IN CAPS: 18.0000 ug | ORAL_CAPSULE | Freq: Every morning | RESPIRATORY_TRACT | Status: DC

## 2015-10-08 NOTE — Care Management Important Message (Signed)
Important Message  Patient Details  Name: Samantha [name] MRN: [id number] Date of Birth: 02-Jul-1966   Medicare Important Message Given:  Yes-second notification given    Verita SchneidersKathy A Allmond 10/08/2015, 8:39 AM

## 2015-10-08 NOTE — Discharge Instructions (Signed)
DIET:  Cardiac diet  DISCHARGE CONDITION:  Fair  ACTIVITY:  Activity as tolerated  OXYGEN:  Home Oxygen: Yes.     Oxygen Delivery: 2 liters/min via Patient connected to nasal cannula oxygen at night  DISCHARGE LOCATION:  home   If you experience worsening of your admission symptoms, develop shortness of breath, life threatening emergency, suicidal or homicidal thoughts you must seek medical attention immediately by calling 911 or calling your MD immediately  if symptoms less severe.  You Must read complete instructions/literature along with all the possible adverse reactions/side effects for all the Medicines you take and that have been prescribed to you. Take any new Medicines after you have completely understood and accpet all the possible adverse reactions/side effects.   Please note  You were cared for by a hospitalist during your hospital stay. If you have any questions about your discharge medications or the care you received while you were in the hospital after you are discharged, you can call the unit and asked to speak with the hospitalist on call if the hospitalist that took care of you is not available. Once you are discharged, your primary care physician will handle any further medical issues. Please note that NO REFILLS for any discharge medications will be authorized once you are discharged, as it is imperative that you return to your primary care physician (or establish a relationship with a primary care physician if you do not have one) for your aftercare needs so that they can reassess your need for medications and monitor your lab values.   Acute Respiratory Distress Syndrome Acute respiratory distress syndrome is a life-threatening condition in which fluid collects in the lungs. This condition can cause severe shortness of breath and low oxygen levels in the blood. It can also cause the lungs and other vital organs to fail. The condition usually develops within 24 to  48 hours of an infection, illness, surgery, or injury. CAUSES This condition may be caused by:  An infection, such as sepsis or pneumonia.  A serious injury to the head or chest.  A major surgery.  A drug overdose.  Breathing in harmful chemicals or smoke.  Blood transfusions.  A blood clot in the lungs. SYMPTOMS Symptoms of this condition include:  Fever.  Difficulty breathing.  Bluish skin (cyanosis).  A fast or irregular heartbeat.  Low blood pressure (hypotension).  Agitation.  Confusion.  Lack of energy (lethargy).  Sweating. DIAGNOSIS This condition may be diagnosed by using tests to rule out other diseases and conditions that cause similar symptoms. You may have:  A chest X-ray or CT scan.  Blood tests.  A sputum culture. In this test, you will be asked to spit so that a sample of lung fluid can be taken for testing.  Bronchoscopy. During this test a thin, flexible tool is passed into the mouth or nose, down the windpipe, and into the lungs. TREATMENT This condition may be treated with:  Oxygen. A breathing machine (ventilator) is often used to provide oxygen and help with breathing.  Medicine to help you relax (sedative).  Fluids and nutrients given through an IV tube.  Blood pressure medicine.  Steroid medicine to help decrease inflammation in the lungs.  Diuretic medicine to get rid of extra fluid in the body. Additional treatment may be needed, depending on the cause of the condition. It can take up to 12 months to recover from this condition. Some people recover fully, but others may continue to have:  Weakness.  Shortness of breath.  Memory problems.  Depression. HOME CARE INSTRUCTIONS Until you recover from this condition:  Do not smoke.  Limit alcohol intake to no more than 1 drink per day for nonpregnant women and 2 drinks per day for men. One drink equals 12 oz of beer, 5 oz of wine, or 1 oz of hard liquor.  Ask friends  and family to help you if daily activities make you tired. SEEK MEDICAL CARE IF:  You become short of breath with activity or while resting.  You develop a cough that does not go away.  You have a fever. SEEK IMMEDIATE MEDICAL CARE IF:  You have sudden shortness of breath.  You develop chest pain that does not go away.  You develop swelling or pain in one of your legs.  You cough up blood.   This information is not intended to replace advice given to you by your health care provider. Make sure you discuss any questions you have with your health care provider.   Document Released: 11/28/2005 Document Revised: 04/14/2015 Document Reviewed: 11/24/2014 Elsevier Interactive Patient Education [employer]

## 2015-10-08 NOTE — Plan of Care (Signed)
Problem: Discharge Progression Outcomes Goal: Other Discharge Outcomes/Goals Plan of care progress to goal: - No complaints of pain. - VSS. - Continues on 02 per [place] at night. - Ambulates in room. - Continues breathing tx. - Possible d/c today. Will contiue to monitor.

## 2015-10-09 NOTE — Discharge Summary (Signed)
[facility] [place] [facility]  DISCHARGE SUMMARY   PATIENT NAME: Samantha SchimkeDeffiney Artus    MR#:  [id number]  DATE OF BIRTH:  December 21, 1965  DATE OF ADMISSION:  10/06/2015 ADMITTING PHYSICIAN: Arnaldo NatalMichael S Diamond, MD  DATE OF DISCHARGE: 10/08/2015  PRIMARY CARE PHYSICIAN: MUNGAL,VISHAL, MD    ADMISSION DIAGNOSIS:  Acute respiratory failure with hypoxia (HCC) [J96.01]  DISCHARGE DIAGNOSIS:  Active Problems:   Acute on chronic respiratory failure (HCC)   SECONDARY DIAGNOSIS:   Past Medical History  Diagnosis Date  . Asthma   . Hypertension   . Diabetes mellitus without complication (HCC)   . COPD (chronic obstructive pulmonary disease) (HCC)     HOSPITAL COURSE:    #1 acute on chronic respiratory failure with hypercapnia - COPD exacerbation - Initially admitted to ICU for BiPAP, then high flow nasal cannula, now back to 2 L, baseline at home) - Appreciate pulmonology consultation. She will follow up with her primary Pulmonologist Dr Park BreedKahn - Discharge on azithromycin, prolonged prednisone taper, as well as prior inhalers  #2 diabetes mellitus type 2 - A1c 7.7. No changes made to home regimen  #3 ongoing tobacco abuse - Smoking cessation counseling provided daily during admission. She states that she has quit as of this admission. - Continue nicotine patch  #4 obesity: Lifestyle modification needed  #5 chronic kidney disease stage III: Stable  #6 hypertension: Blood pressure well controlled continue Cozaar and hydrochlorothiazide   DISCHARGE CONDITIONS:   Stable  CONSULTS OBTAINED:  Treatment Team:  Stephanie AcreVishal Mungal, MD  DRUG ALLERGIES:   Allergies  Allergen Reactions  . Percocet [Oxycodone-Acetaminophen] Itching    DISCHARGE MEDICATIONS:   Discharge Medication List as of 10/08/2015  8:49 AM    START taking these medications   Details  azithromycin (ZITHROMAX) 250 MG tablet Take one tablet daily., Print    tiotropium (SPIRIVA  HANDIHALER) 18 MCG inhalation capsule Place 1 capsule (18 mcg total) into inhaler and inhale every morning., Starting 10/08/2015, Until Discontinued, Print      CONTINUE these medications which have CHANGED   Details  predniSONE (DELTASONE) 20 MG tablet Take 1 tablet (20 mg total) by mouth daily with breakfast. Take 3 tablets a day for 3 days, 2 tablets for 3 days, 1 tablet for 3 days and then stop., Starting 10/08/2015, Until Discontinued, Print      CONTINUE these medications which have NOT CHANGED   Details  albuterol (PROVENTIL HFA;VENTOLIN HFA) 108 (90 BASE) MCG/ACT inhaler Inhale 4-6 puffs by mouth every 4 hours as needed for wheezing, cough, and/or shortness of breath, Print    gabapentin (NEURONTIN) 300 MG capsule Take 300 mg by mouth 2 (two) times daily., Until Discontinued, Historical Med    glimepiride (AMARYL) 4 MG tablet Take 2 mg by mouth 2 (two) times daily. , Until Discontinued, Historical Med    losartan-hydrochlorothiazide (HYZAAR) 50-12.5 MG per tablet Take 2 tablets by mouth daily., Starting 08/31/2015, Until Discontinued, Normal    montelukast (SINGULAIR) 10 MG tablet Take 1 tablet by mouth at bedtime., Until Discontinued, Historical Med    SYMBICORT 160-4.5 MCG/ACT inhaler Inhale 2 puffs into the lungs every 12 (twelve) hours., Starting 08/31/2015, Until Discontinued, Normal         DISCHARGE INSTRUCTIONS:    Stable condition. Continue home oxygen as she had been on prior at 2 L. Heart healthy diabetic diet. No additional home health needs  If you experience worsening of your admission symptoms, develop shortness of breath, life threatening  emergency, suicidal or homicidal thoughts you must seek medical attention immediately by calling 911 or calling your MD immediately  if symptoms less severe.  You Must read complete instructions/literature along with all the possible adverse reactions/side effects for all the Medicines you take and that have been prescribed to  you. Take any new Medicines after you have completely understood and accept all the possible adverse reactions/side effects.   Please note  You were cared for by a hospitalist during your hospital stay. If you have any questions about your discharge medications or the care you received while you were in the hospital after you are discharged, you can call the unit and asked to speak with the hospitalist on call if the hospitalist that took care of you is not available. Once you are discharged, your primary care physician will handle any further medical issues. Please note that NO REFILLS for any discharge medications will be authorized once you are discharged, as it is imperative that you return to your primary care physician (or establish a relationship with a primary care physician if you do not have one) for your aftercare needs so that they can reassess your need for medications and monitor your lab values.    Today   CHIEF COMPLAINT:   Chief Complaint  Patient presents with  . Shortness of Breath    HISTORY OF PRESENT ILLNESS:  She presents emergency department complaining of shortness of breath. She has become progressively more dyspneic over the last 24 hours. She admits that her 50-year-old niece has been sick with what appears to be a viral upper respiratory illness. Notably, the patient also continues to smoke. Over the last day she has been coughing more and bringing up scant amounts of yellow sputum. She denies fevers, chest pain, nausea or vomiting. En route to the emergency department the patient received Solu-Medrol. Upon arrival she was given magnesium and placed on BiPAP. She states she feels better now but respiratory rate is still high and significant wheezing is present which prompted the emergency department staff to call for admission.  VITAL SIGNS:  Blood pressure 139/78, pulse 76, temperature 98 F (36.7 C), temperature source Oral, resp. rate 16, height  (1.702 m),  weight 122.517 kg (270 lb 1.6 oz), SpO2 100 %.  I/O:  No intake or output data in the 24 hours ending 10/09/15 1434  PHYSICAL EXAMINATION:  GENERAL: 49 y.o.-year-old patient sitting up no distress EYES: Pupils equal, round, reactive to light and accommodation. No scleral icterus. Extraocular muscles intact.  HEENT: Head atraumatic, normocephalic. Oropharynx and nasopharynx clear.  NECK: Supple, no jugular venous distention. No thyroid enlargement, no tenderness.  LUNGS: Mild wheezing, good air movement, no distress CARDIOVASCULAR: S1, S2 normal. No murmurs, rubs, or gallops.  ABDOMEN: Soft, nontender, nondistended. Bowel sounds present. No organomegaly or mass.  EXTREMITIES: No pedal edema, cyanosis, or clubbing. Pulses 2+ NEUROLOGIC: Cranial nerves II through XII are intact. Muscle strength 5/5 in all extremities. Sensation intact. Gait not checked.  PSYCHIATRIC: The patient is alert and oriented x 3. Anxious to go home SKIN: No obvious rash, lesion, or ulcer.    DATA REVIEW:   CBC  Recent Labs Lab 10/08/15 0458  WBC 10.9  HGB 14.8  HCT 45.6  PLT 292    Chemistries   Recent Labs Lab 10/06/15 0130  10/08/15 0458  NA 139  < > 140  K 4.4  < > 4.1  CL 107  < > 106  CO2 23  < >  27  GLUCOSE 163*  < > 121*  BUN 21*  < > 26*  CREATININE 1.54*  < > 1.50*  CALCIUM 9.9  < > 9.3  AST 39  --   --   ALT 54  --   --   ALKPHOS 82  --   --   BILITOT 0.5  --   --   < > = values in this interval not displayed.  Cardiac Enzymes  Recent Labs Lab 10/06/15 0130  TROPONINI <0.03    Microbiology Results  Results for orders placed or performed during the hospital encounter of 10/06/15  MRSA PCR Screening     Status: None   Collection Time: 10/06/15  5:00 AM  Result Value Ref Range Status   MRSA by PCR NEGATIVE NEGATIVE Final    Comment:        The GeneXpert MRSA Assay (FDA approved for NASAL specimens only), is one component of a comprehensive MRSA  colonization surveillance program. It is not intended to diagnose MRSA infection nor to guide or monitor treatment for MRSA infections.     RADIOLOGY:  No results found.  EKG:   Orders placed or performed during the hospital encounter of 08/30/15  . EKG 12-Lead  . EKG 12-Lead  . EKG      Management plans discussed with the patient, family and they are in agreement.  CODE STATUS: Full  TOTAL TIME TAKING CARE OF THIS PATIENT: 35 minutes.  Greater than 50% of time spent in care coordination and counseling.  Elby Showers M.D on 10/09/2015 at 2:34 PM  Between 7am to 6pm - Pager - [phone]  After 6pm go to [email or web address] - password [email or web address] [facility]  [facility]  Office  [phone]  CC: Primary care physician; Stephanie Acre, MD

## 2015-10-16 IMAGING — CR DG CHEST 2V
1 series · 2 of 2 positions shown · non-contrast
Comparison: 05/04/2014.

CLINICAL DATA: Cough and shortness of Breath.

EXAM:
CHEST  2 VIEW

[Series 1: w chest pa · 0.14mm/px · 2 of 2 slices shown]
[im 1/2]
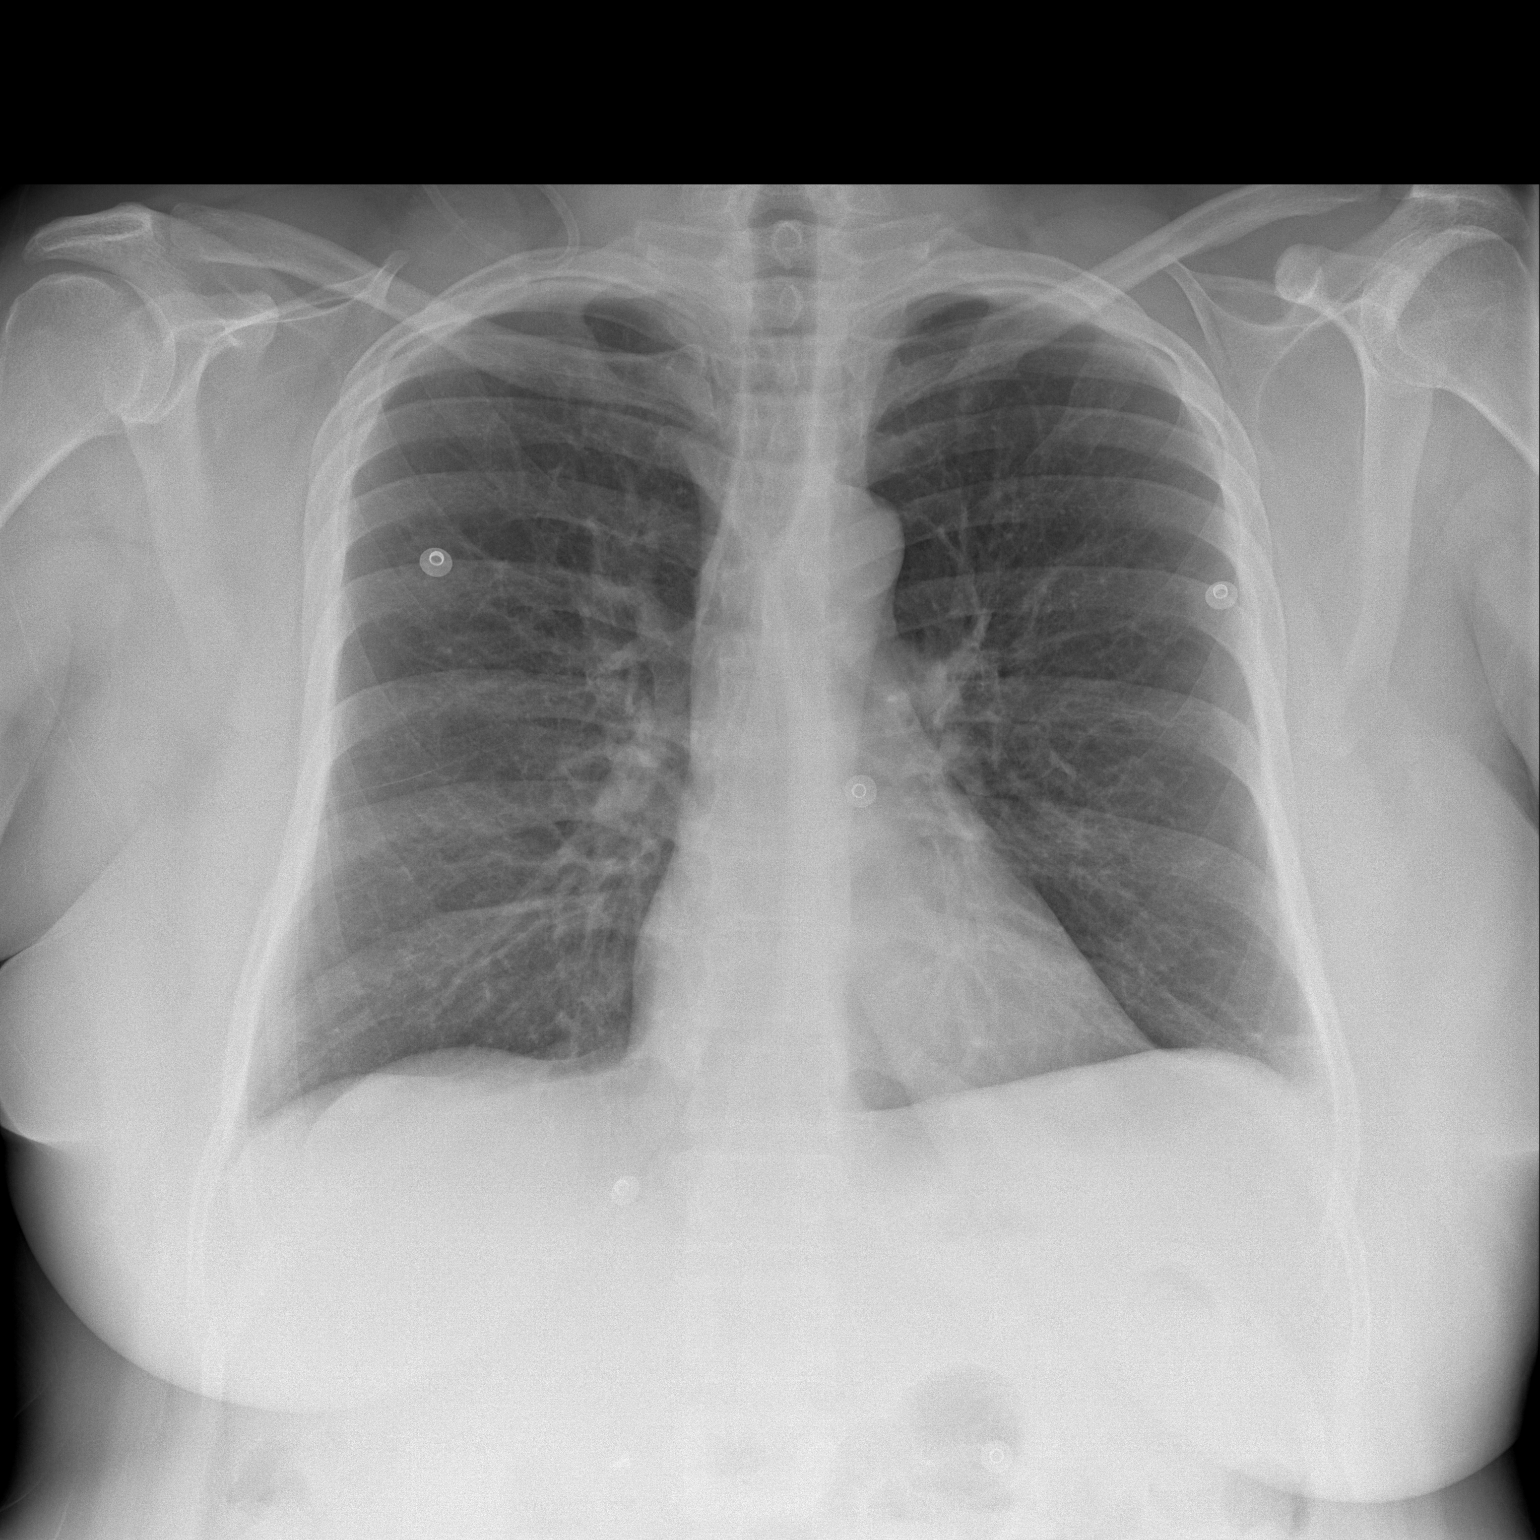
[im 2/2]
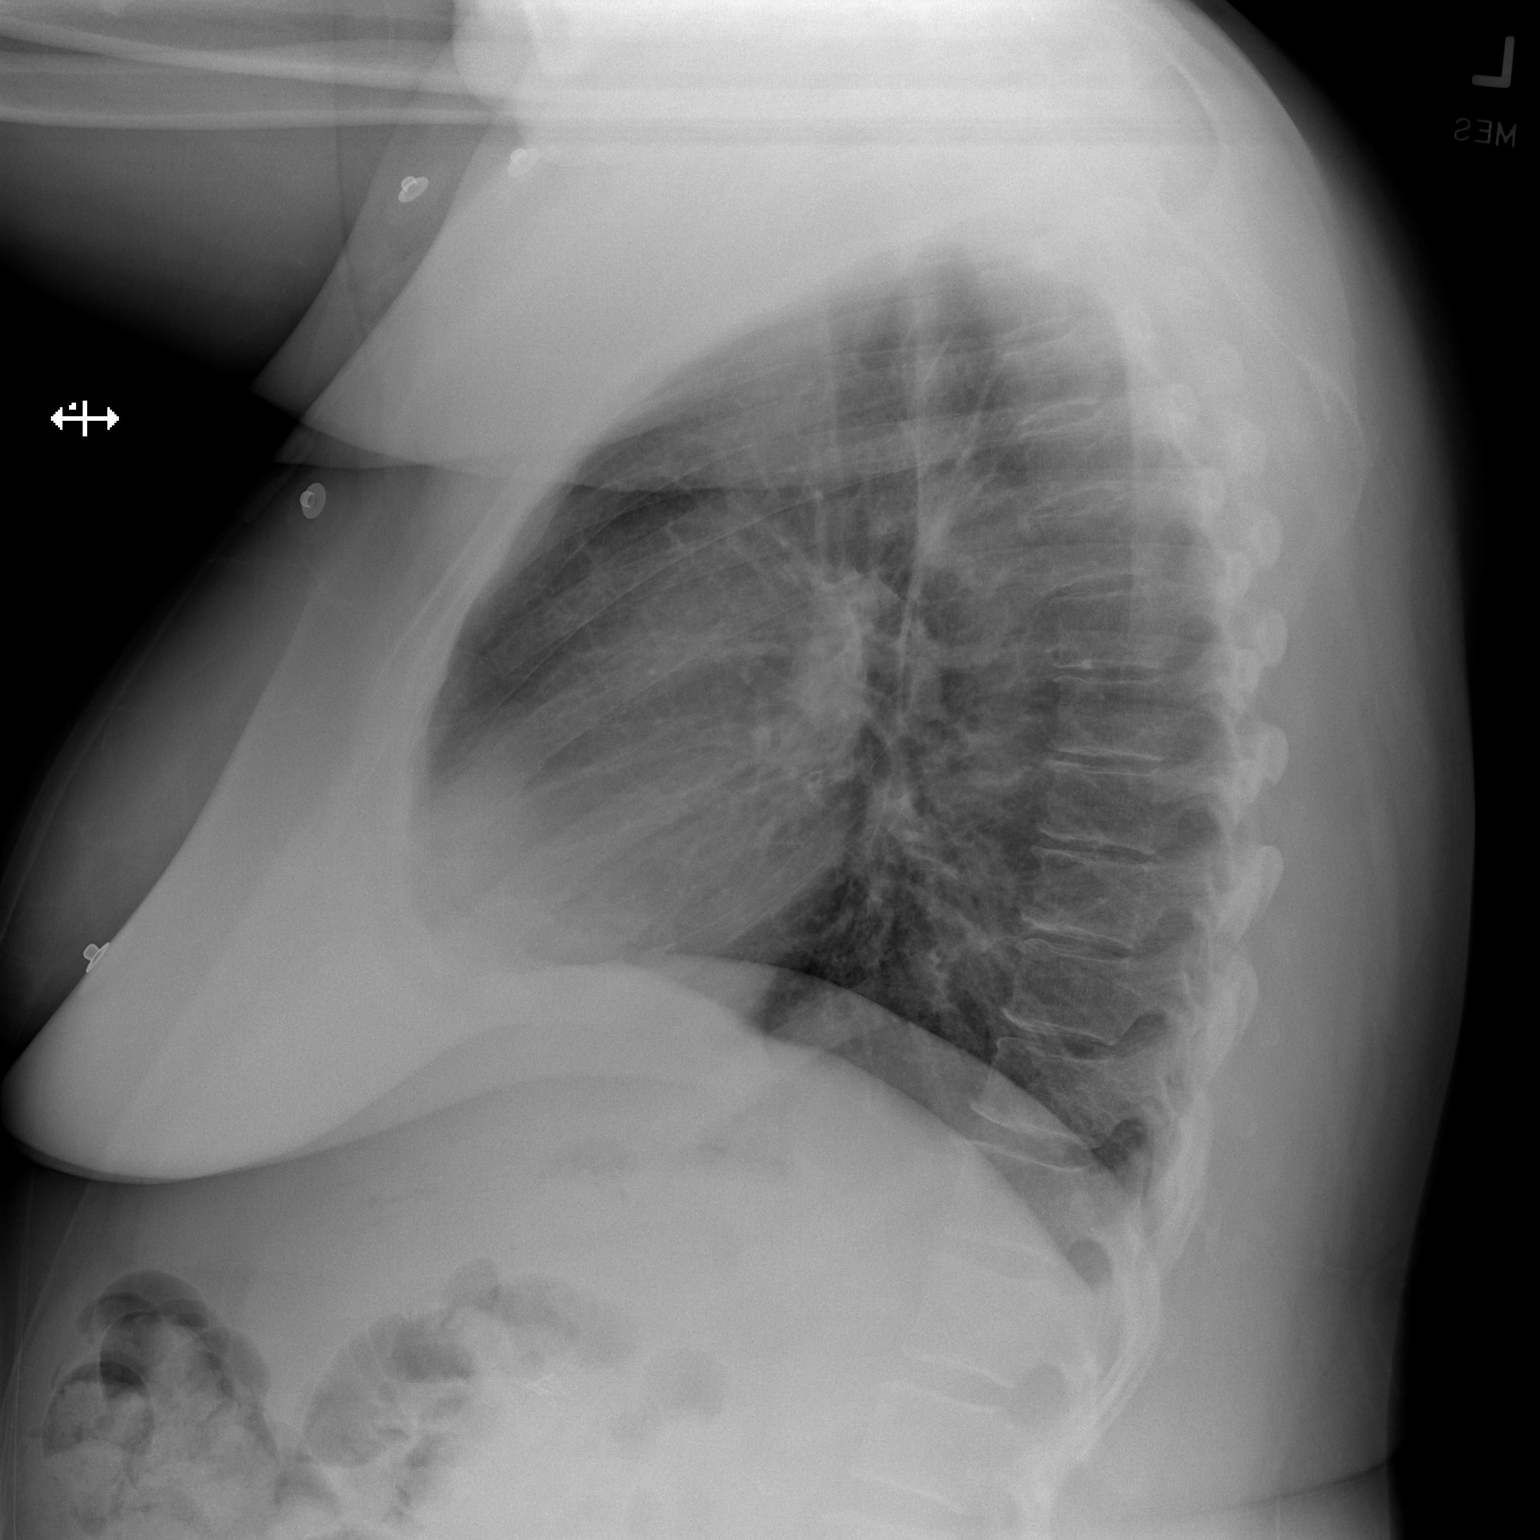

[2 of 2 positions shown; findings below may reference images not displayed]

FINDINGS: The heart size and mediastinal contours are within normal limits.
Both lungs are clear. The visualized skeletal structures are
unremarkable.
IMPRESSION: No acute cardiopulmonary findings.

## 2015-10-28 IMAGING — CR DG CHEST 1V PORT
1 series · 1 of 1 positions shown · non-contrast
Comparison: Two-view chest x-ray 06/16/2014 dating back to
09/25/2013.

CLINICAL DATA: Shortness of breath.

EXAM:
PORTABLE CHEST - 1 VIEW

[ap]
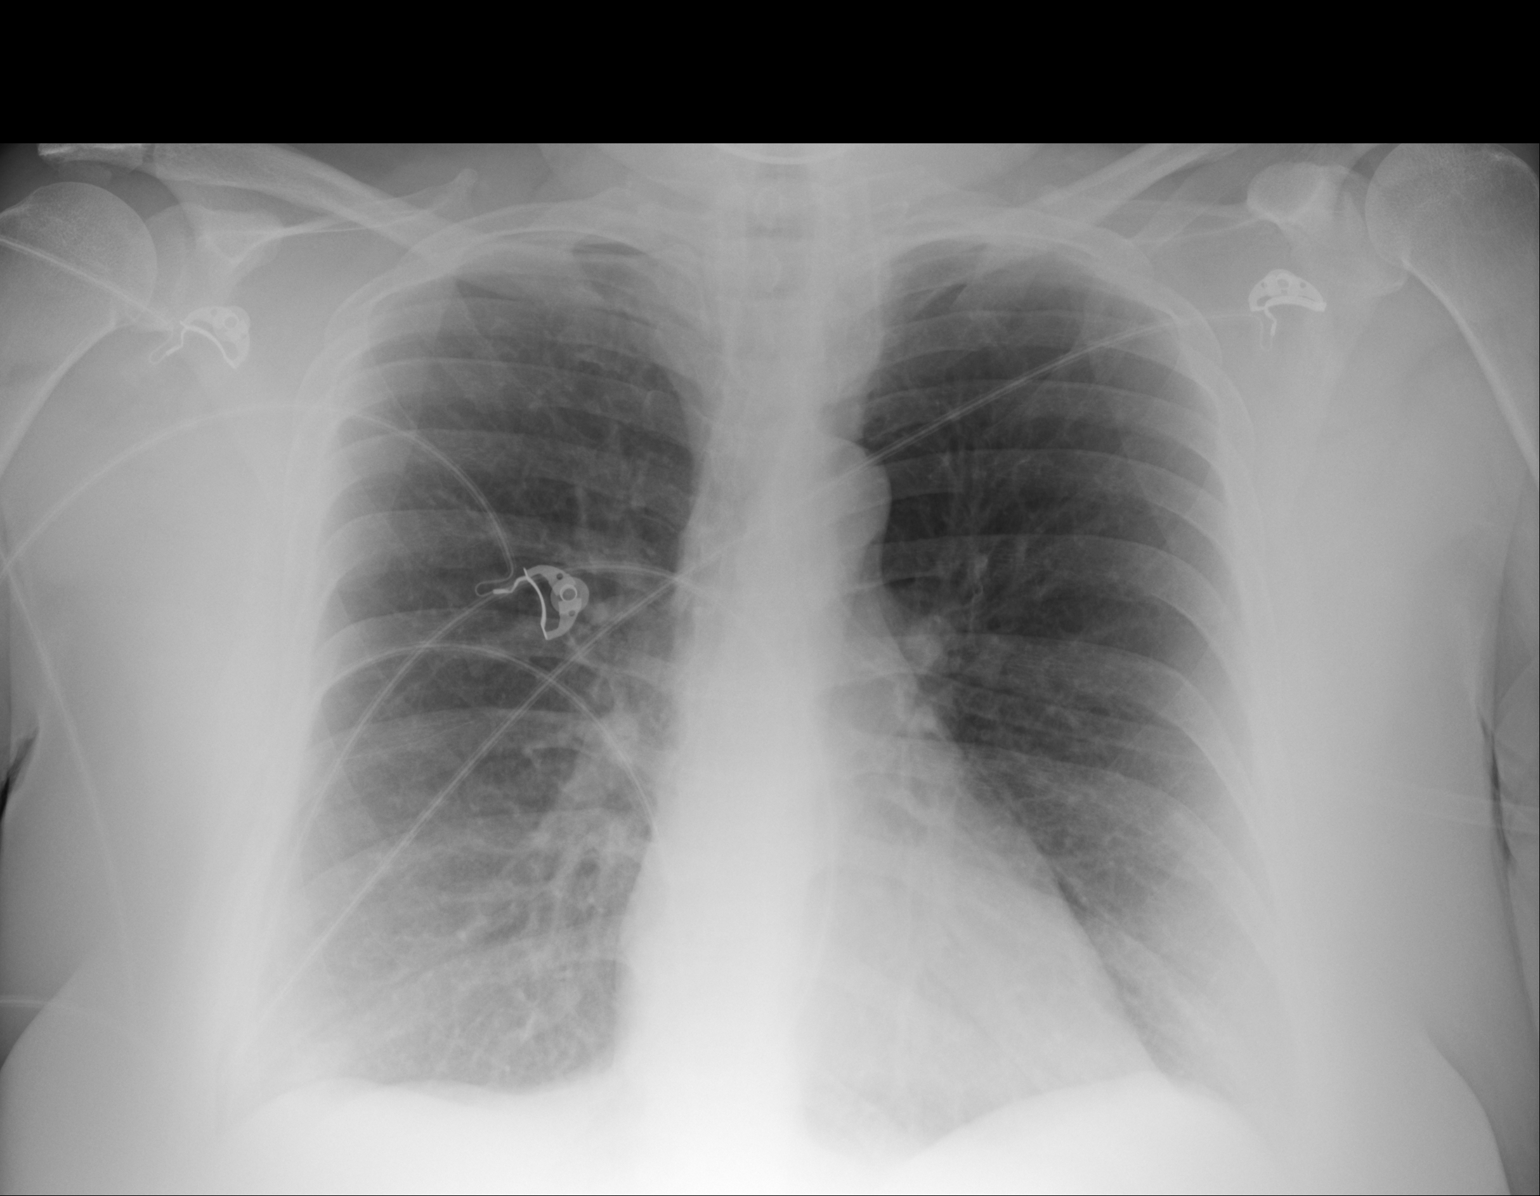

[1 of 1 positions shown; findings below may reference images not displayed]

FINDINGS: Cardiomediastinal silhouette unremarkable and unchanged. Mildly
prominent bronchovascular markings diffusely and mild central
peribronchial thickening, unchanged. Lungs otherwise clear. No
localized airspace consolidation. No pleural effusions. No
pneumothorax. Normal pulmonary vascularity.
IMPRESSION: No acute cardiopulmonary disease. Stable mild changes of chronic
bronchitis and/or asthma.

## 2015-11-09 ENCOUNTER — Emergency Department
Admission: EM | Admit: 2015-11-09 | Discharge: 2015-11-10 | Disposition: A | Discharge status: Home / Self Care | Payer: Medicare Other | Attending: Emergency Medicine | Admitting: Emergency Medicine

## 2015-11-09 DIAGNOSIS — I10 Essential (primary) hypertension: Secondary | ICD-10-CM | POA: Diagnosis not present

## 2015-11-09 DIAGNOSIS — J441 Chronic obstructive pulmonary disease with (acute) exacerbation: Secondary | ICD-10-CM | POA: Diagnosis not present

## 2015-11-09 DIAGNOSIS — F17210 Nicotine dependence, cigarettes, uncomplicated: Secondary | ICD-10-CM | POA: Insufficient documentation

## 2015-11-09 DIAGNOSIS — Z79899 Other long term (current) drug therapy: Secondary | ICD-10-CM | POA: Insufficient documentation

## 2015-11-09 DIAGNOSIS — R0602 Shortness of breath: Secondary | ICD-10-CM | POA: Diagnosis not present

## 2015-11-09 DIAGNOSIS — Z743 Need for continuous supervision: Secondary | ICD-10-CM | POA: Diagnosis not present

## 2015-11-09 DIAGNOSIS — J449 Chronic obstructive pulmonary disease, unspecified: Secondary | ICD-10-CM | POA: Diagnosis not present

## 2015-11-09 DIAGNOSIS — E119 Type 2 diabetes mellitus without complications: Secondary | ICD-10-CM | POA: Insufficient documentation

## 2015-11-09 DIAGNOSIS — R0600 Dyspnea, unspecified: Secondary | ICD-10-CM | POA: Diagnosis not present

## 2015-11-09 DIAGNOSIS — R062 Wheezing: Secondary | ICD-10-CM | POA: Diagnosis not present

## 2015-11-10 ENCOUNTER — Other Ambulatory Visit: Payer: Self-pay

## 2015-11-10 ENCOUNTER — Telehealth: Payer: Self-pay | Admitting: *Deleted

## 2015-11-10 ENCOUNTER — Emergency Department: Payer: Medicare Other

## 2015-11-10 DIAGNOSIS — J441 Chronic obstructive pulmonary disease with (acute) exacerbation: Secondary | ICD-10-CM | POA: Diagnosis not present

## 2015-11-10 DIAGNOSIS — R0600 Dyspnea, unspecified: Secondary | ICD-10-CM | POA: Diagnosis not present

## 2015-11-10 LAB — BASIC METABOLIC PANEL
Anion gap: 5 (ref 5–15)
BUN: 22 mg/dL — ABNORMAL HIGH (ref 6–20)
CALCIUM: 9.4 mg/dL (ref 8.9–10.3)
CHLORIDE: 107 mmol/L (ref 101–111)
CO2: 30 mmol/L (ref 22–32)
CREATININE: 1.59 mg/dL — AB (ref 0.44–1.00)
GFR calc Af Amer: 43 mL/min — ABNORMAL LOW (ref >60)
GFR, EST NON AFRICAN AMERICAN: 37 mL/min — AB (ref >60)
GLUCOSE: 119 mg/dL — AB (ref 65–99)
Potassium: 3.6 mmol/L (ref 3.5–5.1)
Sodium: 142 mmol/L (ref 135–145)

## 2015-11-10 LAB — CBC
HEMATOCRIT: 43.5 % (ref 35.0–47.0)
HEMOGLOBIN: 14.3 g/dL (ref 12.0–16.0)
MCH: 29 pg (ref 26.0–34.0)
MCHC: 32.9 g/dL (ref 32.0–36.0)
MCV: 88.3 fL (ref 80.0–100.0)
Platelets: 318 10*3/uL (ref 150–440)
RBC: 4.92 MIL/uL (ref 3.80–5.20)
RDW: 13.8 % (ref 11.5–14.5)
WBC: 9 10*3/uL (ref 3.6–11.0)

## 2015-11-10 LAB — TROPONIN I: Troponin I: <0.03 ng/mL (ref <0.031)

## 2015-11-10 MED ORDER — MAGNESIUM SULFATE 2 GM/50ML IV SOLN
2.0000 g | Freq: Once | INTRAVENOUS | Status: AC
  Administered 2015-11-10: 2 g via INTRAVENOUS
  Filled 2015-11-10: qty 50

## 2015-11-10 MED ORDER — IPRATROPIUM-ALBUTEROL 0.5-2.5 (3) MG/3ML IN SOLN
3.0000 mL | Freq: Once | RESPIRATORY_TRACT | Status: AC
  Administered 2015-11-10: 3 mL via RESPIRATORY_TRACT

## 2015-11-10 MED ORDER — IPRATROPIUM-ALBUTEROL 0.5-2.5 (3) MG/3ML IN SOLN
RESPIRATORY_TRACT | Status: AC
  Administered 2015-11-10: 3 mL via RESPIRATORY_TRACT
  Filled 2015-11-10: qty 6

## 2015-11-10 MED ORDER — ALBUTEROL SULFATE (2.5 MG/3ML) 0.083% IN NEBU
5.0000 mg | INHALATION_SOLUTION | Freq: Once | RESPIRATORY_TRACT | Status: DC
  Filled 2015-11-10: qty 6

## 2015-11-10 MED ORDER — AZITHROMYCIN 250 MG PO TABS: ORAL_TABLET | ORAL | Status: AC

## 2015-11-10 MED ORDER — PREDNISONE 20 MG PO TABS: 60.0000 mg | ORAL_TABLET | Freq: Every day | ORAL | Status: DC

## 2015-11-10 NOTE — ED Provider Notes (Signed)
[facility] Emergency Department Provider Note  ____________________________________________  Time seen: Approximately 0017 AM  I have reviewed the triage vital signs and the nursing notes.   HISTORY  Chief Complaint Shortness of Breath    HPI Samantha [name] is a 49 y.o. female with a history of COPD and asthma who comes in today with shortness of breath. The patient reports that the symptoms started today. She's been feeling short of breath since 4 PM but it has gotten worse. The patient's nebulizer broke so she was unable to use it. She tried using her inhalers but it did not work. The patient had a little cold and has had some cough with yellow phlegm. The patient reports that she is been worse in the past and did not want to get her symptoms were so she decided to come in for evaluation. EMS gave the patient a dose of Solu-Medrol and a DuoNeb. The patient denies any chest pain, nausea or vomiting. She is on oxygen 2 L at night but has not had to use it or often or frequently. The patient is in tonight for evaluation.   Past Medical History  Diagnosis Date  . Asthma   . Hypertension   . Diabetes mellitus without complication (HCC)   . COPD (chronic obstructive pulmonary disease) [facility])     Patient Active Problem List   Diagnosis Date Noted  . Acute on chronic respiratory failure (HCC) 10/06/2015  . Acute on chronic respiratory failure with hypoxemia (HCC) 08/30/2015    Past Surgical History  Procedure Laterality Date  . None      Current Outpatient Rx  Name  Route  Sig  Dispense  Refill  . albuterol (PROVENTIL HFA;VENTOLIN HFA) 108 (90 BASE) MCG/ACT inhaler      Inhale 4-6 puffs by mouth every 4 hours as needed for wheezing, cough, and/or shortness of breath Patient taking differently: Inhale 2 puffs into the lungs every 6 (six) hours as needed for wheezing or shortness of breath.    1 Inhaler   1   . azithromycin (ZITHROMAX Z-PAK) 250 MG  tablet      Take 2 tablets (500 mg) on  Day 1,  followed by 1 tablet (250 mg) once daily on Days 2 through 5.   6 each   0   . azithromycin (ZITHROMAX) 250 MG tablet      Take one tablet daily.   3 each   0   . gabapentin (NEURONTIN) 300 MG capsule   Oral   Take 300 mg by mouth 2 (two) times daily.      0   . glimepiride (AMARYL) 4 MG tablet   Oral   Take 2 mg by mouth 2 (two) times daily.       0   . losartan-hydrochlorothiazide (HYZAAR) 50-12.5 MG per tablet   Oral   Take 2 tablets by mouth daily. Patient taking differently: Take 1 tablet by mouth daily.    60 tablet   0   . montelukast (SINGULAIR) 10 MG tablet   Oral   Take 1 tablet by mouth at bedtime.      0   . predniSONE (DELTASONE) 20 MG tablet   Oral   Take 1 tablet (20 mg total) by mouth daily with breakfast. Take 3 tablets a day for 3 days, 2 tablets for 3 days, 1 tablet for 3 days and then stop.   20 tablet   0   . predniSONE (DELTASONE) 20  MG tablet   Oral   Take 3 tablets (60 mg total) by mouth daily.   12 tablet   0   . SYMBICORT 160-4.5 MCG/ACT inhaler   Inhalation   Inhale 2 puffs into the lungs every 12 (twelve) hours.   1 Inhaler   3     Dispense as written.   . tiotropium (SPIRIVA HANDIHALER) 18 MCG inhalation capsule   Inhalation   Place 1 capsule (18 mcg total) into inhaler and inhale every morning.   30 capsule   12     Allergies Percocet  Family History  Problem Relation Age of Onset  . Asthma Mother   . Asthma Sister     Social History Social History  Substance Use Topics  . Smoking status: Current Every Day Smoker -- 1.00 packs/day for 23 years    Types: Cigarettes  . Smokeless tobacco: Never Used  . Alcohol Use: Yes    Review of Systems Constitutional: No fever/chills Eyes: No visual changes. ENT: No sore throat. Cardiovascular: Denies chest pain. Respiratory:  shortness of breath and cough Gastrointestinal: No abdominal pain.  No nausea, no vomiting.   No diarrhea.  No constipation. Genitourinary: Negative for dysuria. Musculoskeletal: Negative for back pain. Skin: Negative for rash. Neurological: Negative for headaches, focal weakness or numbness.  10-point ROS otherwise negative.  ____________________________________________   PHYSICAL EXAM:  VITAL SIGNS: ED Triage Vitals  Enc Vitals Group     BP 11/10/15 0005 152/89 mmHg     Pulse Rate 11/10/15 0005 91     Resp 11/10/15 0005 21     Temp 11/10/15 0004 98.7 F (37.1 C)     Temp Source 11/10/15 0004 Oral     SpO2 11/10/15 0005 97 %     Weight 11/10/15 0005 264 lb (119.75 kg)     Height 11/10/15 0005 5\' 7"  (1.702 m)     Head Cir --      Peak Flow --      Pain Score --      Pain Loc --      Pain Edu? --      Excl. in GC? --     Constitutional: Alert and oriented. Well appearing and in moderate distress. Eyes: Conjunctivae are normal. PERRL. EOMI. Head: Atraumatic. Nose: No congestion/rhinnorhea. Mouth/Throat: Mucous membranes are moist.  Oropharynx non-erythematous. Cardiovascular: Normal rate, regular rhythm. Grossly normal heart sounds.  Good peripheral circulation. Respiratory: Normal respiratory effort.  Tight expiratory wheezes throughout all lung fields Gastrointestinal: Soft and nontender. No distention. Positive bowel sounds Musculoskeletal: No lower extremity tenderness nor edema.   Neurologic:  Normal speech and language. Skin:  Skin is warm, dry and intact.  Psychiatric: Mood and affect are normal.   ____________________________________________   LABS (all labs ordered are listed, but only abnormal results are displayed)  Labs Reviewed  BASIC METABOLIC PANEL - Abnormal; Notable for the following:    Glucose, Bld 119 (*)    BUN 22 (*)    Creatinine, Ser 1.59 (*)    GFR calc non Af Amer 37 (*)    GFR calc Af Amer 43 (*)    All other components within normal limits  CBC  TROPONIN I   ____________________________________________  EKG  ED ECG  REPORT I, Rebecka ApleyWebster,  Dejha King P, the attending physician, personally viewed and interpreted this ECG.   Date: 11/10/2015  EKG Time: 0020  Rate: 88  Rhythm: normal EKG, normal sinus rhythm  Axis: Normal  Intervals:none  ST&T Change:  None  ____________________________________________  RADIOLOGY  Chest x-ray: No active cardiopulmonary disease. ____________________________________________   PROCEDURES  Procedure(s) performed: None  Critical Care performed: No  ____________________________________________   INITIAL IMPRESSION / ASSESSMENT AND PLAN / ED COURSE  Pertinent labs & imaging results that were available during my care of the patient were reviewed by me and considered in my medical decision making (see chart for details).  This is a 49 year old female who comes in today with some shortness of breath. The patient does have a history of asthma and COPD. The patient received Solu-Medrol and a DuoNeb by EMS but I will give her 2 duo nebs as well as magnesium sulfate for her tight wheezes. I will check a chest x-ray and some blood work and reassess the patient when she's received all of her medication.  After the nebs and the magnesium sulfate the patient reports that she feels much improved. Her oxygen saturation is in the low 90s around 93-94% on room air. The patient does still have an occasional mild wheeze but her breath sounds are improved. The patient reports that she is ready to go home. I will discharge the patient with some prednisone as well as azithromycin. The patient reports that she has a ready contacted her physician and they will ship another nebulizer to her in the next couple of days. The patient will be instructed to use her OB are all inhaler every 4 hours for the next 24 hours and to follow-up with her primary care physician. ____________________________________________   FINAL CLINICAL IMPRESSION(S) / ED DIAGNOSES  Final diagnoses:  COPD exacerbation (HCC)       Rebecka Apley, MD 11/10/15 [phone]

## 2015-11-10 NOTE — Telephone Encounter (Signed)
-----   Message from Stephanie AcreVishal Mungal, MD sent at 11/10/2015  8:52 AM EST ----- Regarding: pt f/u Sch f\u with Belia HemanKasa

## 2015-11-10 NOTE — ED Notes (Addendum)
Per EMS patient comes from home. Hx of COPD. Current every day smoker. Since yesterday patient has had difficult breathing. Today patient took several breathing treatments and her nebulizer compressor broke. EMS gave 125 of Solumedrol and 1 duoneb treatment.  Patient A&O x4. Auditory exp wheezing noted. SpO2 97% on RA.

## 2015-11-10 NOTE — Telephone Encounter (Signed)
Pt states she has an appt already with a pulmonologist. Nothing further needed.

## 2015-11-10 NOTE — Discharge Instructions (Signed)

## 2015-11-10 NOTE — ED Notes (Signed)
Lab called and notified to add on Troponin.

## 2015-11-13 ENCOUNTER — Telehealth: Payer: Self-pay | Admitting: Emergency Medicine

## 2015-11-13 NOTE — ED Notes (Signed)
Pt called because she lost her paper prescriptions for prednisone and z pack.  i called them in to [facility] [address] for her.

## 2015-11-16 DIAGNOSIS — J449 Chronic obstructive pulmonary disease, unspecified: Secondary | ICD-10-CM | POA: Diagnosis not present

## 2015-11-16 DIAGNOSIS — F17210 Nicotine dependence, cigarettes, uncomplicated: Secondary | ICD-10-CM | POA: Diagnosis not present

## 2015-11-16 DIAGNOSIS — R0602 Shortness of breath: Secondary | ICD-10-CM | POA: Diagnosis not present

## 2015-12-04 ENCOUNTER — Encounter: Payer: Self-pay | Admitting: *Deleted

## 2015-12-04 ENCOUNTER — Emergency Department
Admission: EM | Admit: 2015-12-04 | Discharge: 2015-12-04 | Disposition: A | Discharge status: Home / Self Care | Payer: Medicare Other | Attending: Emergency Medicine | Admitting: Emergency Medicine

## 2015-12-04 DIAGNOSIS — Z79899 Other long term (current) drug therapy: Secondary | ICD-10-CM | POA: Insufficient documentation

## 2015-12-04 DIAGNOSIS — Z9981 Dependence on supplemental oxygen: Secondary | ICD-10-CM | POA: Diagnosis not present

## 2015-12-04 DIAGNOSIS — I10 Essential (primary) hypertension: Secondary | ICD-10-CM | POA: Insufficient documentation

## 2015-12-04 DIAGNOSIS — F17210 Nicotine dependence, cigarettes, uncomplicated: Secondary | ICD-10-CM | POA: Diagnosis not present

## 2015-12-04 DIAGNOSIS — Z7984 Long term (current) use of oral hypoglycemic drugs: Secondary | ICD-10-CM | POA: Insufficient documentation

## 2015-12-04 DIAGNOSIS — R0602 Shortness of breath: Secondary | ICD-10-CM | POA: Diagnosis present

## 2015-12-04 DIAGNOSIS — J440 Chronic obstructive pulmonary disease with acute lower respiratory infection: Secondary | ICD-10-CM | POA: Diagnosis not present

## 2015-12-04 DIAGNOSIS — E119 Type 2 diabetes mellitus without complications: Secondary | ICD-10-CM | POA: Insufficient documentation

## 2015-12-04 DIAGNOSIS — J4541 Moderate persistent asthma with (acute) exacerbation: Secondary | ICD-10-CM | POA: Diagnosis not present

## 2015-12-04 MED ORDER — PREDNISONE 20 MG PO TABS
80.0000 mg | ORAL_TABLET | Freq: Once | ORAL | Status: AC
  Administered 2015-12-04: 80 mg via ORAL
  Filled 2015-12-04: qty 4

## 2015-12-04 MED ORDER — IPRATROPIUM-ALBUTEROL 0.5-2.5 (3) MG/3ML IN SOLN
3.0000 mL | Freq: Once | RESPIRATORY_TRACT | Status: AC
  Administered 2015-12-04: 3 mL via RESPIRATORY_TRACT
  Filled 2015-12-04: qty 3

## 2015-12-04 MED ORDER — AMOXICILLIN 500 MG PO TABS: 500.0000 mg | ORAL_TABLET | Freq: Three times a day (TID) | ORAL | Status: DC

## 2015-12-04 MED ORDER — PREDNISONE 20 MG PO TABS: ORAL_TABLET | ORAL | Status: DC

## 2015-12-04 NOTE — ED Notes (Signed)
Pt given prescription for prednisone and amoxicillin. Feels much better after receiving breathing treatment.

## 2015-12-04 NOTE — Discharge Instructions (Signed)
Asthma, Adult Asthma is a recurring condition in which the airways tighten and narrow. Asthma can make it difficult to breathe. It can cause coughing, wheezing, and shortness of breath. Asthma episodes, also called asthma attacks, range from minor to life-threatening. Asthma cannot be cured, but medicines and lifestyle changes can help control it. CAUSES Asthma is believed to be caused by inherited (genetic) and environmental factors, but its exact cause is unknown. Asthma may be triggered by allergens, lung infections, or irritants in the air. Asthma triggers are different for each person. Common triggers include:   Animal dander.  Dust mites.  Cockroaches.  Pollen from trees or grass.  Mold.  Smoke.  Air pollutants such as dust, household cleaners, hair sprays, aerosol sprays, paint fumes, strong chemicals, or strong odors.  Cold air, weather changes, and winds (which increase molds and pollens in the air).  Strong emotional expressions such as crying or laughing hard.  Stress.  Certain medicines (such as aspirin) or types of drugs (such as beta-blockers).  Sulfites in foods and drinks. Foods and drinks that may contain sulfites include dried fruit, potato chips, and sparkling grape juice.  Infections or inflammatory conditions such as the flu, a cold, or an inflammation of the nasal membranes (rhinitis).  Gastroesophageal reflux disease (GERD).  Exercise or strenuous activity. SYMPTOMS Symptoms may occur immediately after asthma is triggered or many hours later. Symptoms include:  Wheezing.  Excessive nighttime or early morning coughing.  Frequent or severe coughing with a common cold.  Chest tightness.  Shortness of breath. DIAGNOSIS  The diagnosis of asthma is made by a review of your medical history and a physical exam. Tests may also be performed. These may include:  Lung function studies. These tests show how much air you breathe in and out.  Allergy  tests.  Imaging tests such as X-rays. TREATMENT  Asthma cannot be cured, but it can usually be controlled. Treatment involves identifying and avoiding your asthma triggers. It also involves medicines. There are 2 classes of medicine used for asthma treatment:   Controller medicines. These prevent asthma symptoms from occurring. They are usually taken every day.  Reliever or rescue medicines. These quickly relieve asthma symptoms. They are used as needed and provide short-term relief. Your health care provider will help you create an asthma action plan. An asthma action plan is a written plan for managing and treating your asthma attacks. It includes a list of your asthma triggers and how they may be avoided. It also includes information on when medicines should be taken and when their dosage should be changed. An action plan may also involve the use of a device called a peak flow meter. A peak flow meter measures how well the lungs are working. It helps you monitor your condition. HOME CARE INSTRUCTIONS   Take medicines only as directed by your health care provider. Speak with your health care provider if you have questions about how or when to take the medicines.  Use a peak flow meter as directed by your health care provider. Record and keep track of readings.  Understand and use the action plan to help minimize or stop an asthma attack without needing to seek medical care.  Control your home environment in the following ways to help prevent asthma attacks:  Do not smoke. Avoid being exposed to secondhand smoke.  Change your heating and air conditioning filter regularly.  Limit your use of fireplaces and wood stoves.  Get rid of pests (such as roaches   and mice) and their droppings.  Throw away plants if you see mold on them.  Clean your floors and dust regularly. Use unscented cleaning products.  Try to have someone else vacuum for you regularly. Stay out of rooms while they are  being vacuumed and for a short while afterward. If you vacuum, use a dust mask from a hardware store, a double-layered or microfilter vacuum cleaner bag, or a vacuum cleaner with a HEPA filter.  Replace carpet with wood, tile, or vinyl flooring. Carpet can trap dander and dust.  Use allergy-proof pillows, mattress covers, and box spring covers.  Wash bed sheets and blankets every week in hot water and dry them in a dryer.  Use blankets that are made of polyester or cotton.  Clean bathrooms and kitchens with bleach. If possible, have someone repaint the walls in these rooms with mold-resistant paint. Keep out of the rooms that are being cleaned and painted.  Wash hands frequently. SEEK MEDICAL CARE IF:   You have wheezing, shortness of breath, or a cough even if taking medicine to prevent attacks.  The colored mucus you cough up (sputum) is thicker than usual.  Your sputum changes from clear or white to yellow, green, gray, or bloody.  You have any problems that may be related to the medicines you are taking (such as a rash, itching, swelling, or trouble breathing).  You are using a reliever medicine more than 2-3 times per week.  Your peak flow is still at 50-79% of your personal best after following your action plan for 1 hour.  You have a fever. SEEK IMMEDIATE MEDICAL CARE IF:   You seem to be getting worse and are unresponsive to treatment during an asthma attack.  You are short of breath even at rest.  You get short of breath when doing very little physical activity.  You have difficulty eating, drinking, or talking due to asthma symptoms.  You develop chest pain.  You develop a fast heartbeat.  You have a bluish color to your lips or fingernails.  You are light-headed, dizzy, or faint.  Your peak flow is less than 50% of your personal best.   This information is not intended to replace advice given to you by your health care provider. Make sure you discuss any  questions you have with your health care provider.   Document Released: 11/28/2005 Document Revised: 08/19/2015 Document Reviewed: 06/27/2013 Elsevier Interactive Patient Education 2016 Elsevier Inc.  

## 2015-12-04 NOTE — ED Notes (Signed)
Pt O2 stat at 92% on room air. Placed on 2L O2 stat at 94%.

## 2015-12-04 NOTE — ED Provider Notes (Signed)
[facility] Emergency Department Provider Note  ____________________________________________  Time seen: 8:20 AM   I have reviewed the triage vital signs and the nursing notes.  History by:  Patient  HISTORY  Chief Complaint Shortness of Breath     HPI Samantha [name] is a 49 y.o. female with a long history of significant asthma, COPD. I have been helping care for her for 8-10 years. She presents today with an episode of significant shortness of breath. She was last seen here at the end of November. She was treated with steroids and azithromycin beginning December 2. She reports she had been doing okay but developed a sore throat and some nasal congestion a few days ago and now this seems to be triggering shortness of breath for her as well. She uses oxygen at night, 2 L. She was able to drive her cell to the emergency department, but she has ongoing wheezing and shortness of breath. She denies any fever.    Past Medical History  Diagnosis Date  . Asthma   . Hypertension   . Diabetes mellitus without complication (HCC)   . COPD (chronic obstructive pulmonary disease) [facility](HCC)     Patient Active Problem List   Diagnosis Date Noted  . Acute on chronic respiratory failure (HCC) 10/06/2015  . Acute on chronic respiratory failure with hypoxemia (HCC) 08/30/2015    Past Surgical History  Procedure Laterality Date  . None      Current Outpatient Rx  Name  Route  Sig  Dispense  Refill  . albuterol (PROVENTIL HFA;VENTOLIN HFA) 108 (90 BASE) MCG/ACT inhaler      Inhale 4-6 puffs by mouth every 4 hours as needed for wheezing, cough, and/or shortness of breath Patient taking differently: Inhale 2 puffs into the lungs every 6 (six) hours as needed for wheezing or shortness of breath.    1 Inhaler   1   . gabapentin (NEURONTIN) 300 MG capsule   Oral   Take 300 mg by mouth 2 (two) times daily.      0   . glimepiride (AMARYL) 4 MG tablet   Oral   Take 2  mg by mouth 2 (two) times daily.       0   . losartan-hydrochlorothiazide (HYZAAR) 50-12.5 MG per tablet   Oral   Take 2 tablets by mouth daily. Patient taking differently: Take 1 tablet by mouth daily.    60 tablet   0   . montelukast (SINGULAIR) 10 MG tablet   Oral   Take 1 tablet by mouth at bedtime.      0   . SYMBICORT 160-4.5 MCG/ACT inhaler   Inhalation   Inhale 2 puffs into the lungs every 12 (twelve) hours.   1 Inhaler   3     Dispense as written.   . tiotropium (SPIRIVA HANDIHALER) 18 MCG inhalation capsule   Inhalation   Place 1 capsule (18 mcg total) into inhaler and inhale every morning.   30 capsule   12   . predniSONE (DELTASONE) 20 MG tablet   Oral   Take 1 tablet (20 mg total) by mouth daily with breakfast. Take 3 tablets a day for 3 days, 2 tablets for 3 days, 1 tablet for 3 days and then stop. Patient not taking: Reported on 12/04/2015   20 tablet   0   . predniSONE (DELTASONE) 20 MG tablet   Oral   Take 3 tablets (60 mg total) by mouth daily. Patient not  taking: Reported on 12/04/2015   12 tablet   0   . predniSONE (DELTASONE) 20 MG tablet      Take 3 tablets on Saturday then 2 tablets on Sunday, Monday, and Tuesday, then 1 tablet on Wednesday, Thursday, and Friday.   12 tablet   1     Allergies Percocet  Family History  Problem Relation Age of Onset  . Asthma Mother   . Asthma Sister     Social History Social History  Substance Use Topics  . Smoking status: Current Every Day Smoker -- 1.00 packs/day for 23 years    Types: Cigarettes  . Smokeless tobacco: Never Used  . Alcohol Use: Yes    Review of Systems  Constitutional: Negative for fever/chills. ENT: Negative for congestion. Cardiovascular: Negative for chest pain. Respiratory: Asthma, COPD, with acute flare. See history of present illness Gastrointestinal: Negative for abdominal pain, vomiting and diarrhea. Genitourinary: Negative for dysuria. Musculoskeletal: No  back pain. Skin: Negative for rash. Neurological: Negative for headache or focal weakness   10-point ROS otherwise negative.  ____________________________________________   PHYSICAL EXAM:  VITAL SIGNS: ED Triage Vitals  Enc Vitals Group     BP 12/04/15 0806 157/91 mmHg     Pulse Rate 12/04/15 0806 102     Resp 12/04/15 0808 28     Temp 12/04/15 0808 97.7 F (36.5 C)     Temp Source 12/04/15 0808 Oral     SpO2 12/04/15 0806 94 %     Weight 12/04/15 0808 269 lb (122.018 kg)     Height 12/04/15 0808  (1.702 m)     Head Cir --      Peak Flow --      Pain Score --      Pain Loc --      Pain Edu? --      Excl. in GC? --     Constitutional:  Alert and oriented. Mild increase work of breathing, but communicative and otherwise in no distress.Marland Kitchen E[facility]   Head: Normocephalic and atraumatic.   Nose: No congestion/rhinnorhea.       Mouth: No erythema, no swelling   Cardiovascular: Normal rate, regular rhythm, no murmur noted Respiratory:  Minimal increased respiratory effort, no tachypnea.   Diffuse mild wheezing with mild decreased breath sounds bilaterally. Gastrointestinal: Soft, no distention. Nontender Back: No muscle spasm, no tenderness, no CVA tenderness. Musculoskeletal: No deformity noted. Nontender with normal range of motion in all extremities.  No noted edema. Neurologic:  Communicative. Normal appearing spontaneous movement in all 4 extremities. No gross focal neurologic deficits are appreciated.  Skin:  Skin is warm, dry. No rash noted. Psychiatric: Mood and affect are normal. Speech and behavior are normal.  ____________________________________________   INITIAL IMPRESSION / ASSESSMENT AND PLAN / ED COURSE  Pertinent labs & imaging results that were available during my care of the patient were reviewed by me and considered in my medical decision making (see chart for details).  Patient is a very pleasant interactive 49 year old female who is known to me  from prior interactions. Due to the frequent need for assistance, she is fairly knowledgeable about what she needs at any given time. She denies have discussed steroids and the pros and cons of additional antibiotics at this point. We have agreed not to draw blood or start an IV but give her prednisone, 80 mg. The azithromycin she took is likely just now becoming subtherapeutic. We will treat her with amoxicillin. My suspicion for bacterial  infection is quite low, however given her her pulmonary function and low pulmonary reserve, I believe antibiotics are appropriate for her situation.  ----------------------------------------- 9:51 AM on 12/04/2015 -----------------------------------------  Patient reports she feels clinically better. She is now off oxygen and her O2 sat level is satisfactory. I've offered a second neb but she has declined this. As better and is eager to go. We will write a prescription for amoxicillin and for ongoing prednisone. We will have her follow-up at [facility] where she sees her pulmonologist, Dr. Pasty Arch.  ____________________________________________   FINAL CLINICAL IMPRESSION(S) / ED DIAGNOSES  Final diagnoses:  Asthma exacerbation attacks, moderate persistent      [name], MD 12/04/15 [id number]

## 2015-12-18 DIAGNOSIS — F17210 Nicotine dependence, cigarettes, uncomplicated: Secondary | ICD-10-CM | POA: Diagnosis not present

## 2015-12-18 DIAGNOSIS — J301 Allergic rhinitis due to pollen: Secondary | ICD-10-CM | POA: Diagnosis not present

## 2015-12-18 DIAGNOSIS — E783 Hyperchylomicronemia: Secondary | ICD-10-CM | POA: Diagnosis not present

## 2015-12-18 DIAGNOSIS — J441 Chronic obstructive pulmonary disease with (acute) exacerbation: Secondary | ICD-10-CM | POA: Diagnosis not present

## 2015-12-18 DIAGNOSIS — Z0001 Encounter for general adult medical examination with abnormal findings: Secondary | ICD-10-CM | POA: Diagnosis not present

## 2015-12-18 DIAGNOSIS — E782 Mixed hyperlipidemia: Secondary | ICD-10-CM | POA: Diagnosis not present

## 2015-12-18 DIAGNOSIS — I10 Essential (primary) hypertension: Secondary | ICD-10-CM | POA: Diagnosis not present

## 2015-12-18 DIAGNOSIS — E1165 Type 2 diabetes mellitus with hyperglycemia: Secondary | ICD-10-CM | POA: Diagnosis not present

## 2016-01-05 ENCOUNTER — Emergency Department: Payer: Medicare Other

## 2016-01-05 ENCOUNTER — Encounter: Payer: Self-pay | Admitting: Emergency Medicine

## 2016-01-05 ENCOUNTER — Emergency Department
Admission: EM | Admit: 2016-01-05 | Discharge: 2016-01-05 | Disposition: A | Discharge status: Home / Self Care | Payer: Medicare Other | Attending: Emergency Medicine | Admitting: Emergency Medicine

## 2016-01-05 DIAGNOSIS — Z792 Long term (current) use of antibiotics: Secondary | ICD-10-CM | POA: Insufficient documentation

## 2016-01-05 DIAGNOSIS — E119 Type 2 diabetes mellitus without complications: Secondary | ICD-10-CM | POA: Insufficient documentation

## 2016-01-05 DIAGNOSIS — F17210 Nicotine dependence, cigarettes, uncomplicated: Secondary | ICD-10-CM | POA: Diagnosis not present

## 2016-01-05 DIAGNOSIS — Z7984 Long term (current) use of oral hypoglycemic drugs: Secondary | ICD-10-CM | POA: Diagnosis not present

## 2016-01-05 DIAGNOSIS — I10 Essential (primary) hypertension: Secondary | ICD-10-CM | POA: Diagnosis not present

## 2016-01-05 DIAGNOSIS — Z7951 Long term (current) use of inhaled steroids: Secondary | ICD-10-CM | POA: Insufficient documentation

## 2016-01-05 DIAGNOSIS — R197 Diarrhea, unspecified: Secondary | ICD-10-CM | POA: Diagnosis not present

## 2016-01-05 DIAGNOSIS — J441 Chronic obstructive pulmonary disease with (acute) exacerbation: Secondary | ICD-10-CM | POA: Insufficient documentation

## 2016-01-05 DIAGNOSIS — Z7952 Long term (current) use of systemic steroids: Secondary | ICD-10-CM | POA: Insufficient documentation

## 2016-01-05 DIAGNOSIS — R109 Unspecified abdominal pain: Secondary | ICD-10-CM

## 2016-01-05 DIAGNOSIS — R1084 Generalized abdominal pain: Secondary | ICD-10-CM | POA: Diagnosis not present

## 2016-01-05 DIAGNOSIS — A5901 Trichomonal vulvovaginitis: Secondary | ICD-10-CM | POA: Diagnosis not present

## 2016-01-05 DIAGNOSIS — R1031 Right lower quadrant pain: Secondary | ICD-10-CM | POA: Diagnosis present

## 2016-01-05 LAB — COMPREHENSIVE METABOLIC PANEL
ALBUMIN: 4 g/dL (ref 3.5–5.0)
ALK PHOS: 86 U/L (ref 38–126)
ALT: 61 U/L — ABNORMAL HIGH (ref 14–54)
AST: 29 U/L (ref 15–41)
Anion gap: 9 (ref 5–15)
BILIRUBIN TOTAL: 0.8 mg/dL (ref 0.3–1.2)
BUN: 14 mg/dL (ref 6–20)
CALCIUM: 9.6 mg/dL (ref 8.9–10.3)
CO2: 29 mmol/L (ref 22–32)
Chloride: 101 mmol/L (ref 101–111)
Creatinine, Ser: 1.66 mg/dL — ABNORMAL HIGH (ref 0.44–1.00)
GFR calc Af Amer: 41 mL/min — ABNORMAL LOW (ref >60)
GFR calc non Af Amer: 35 mL/min — ABNORMAL LOW (ref >60)
GLUCOSE: 151 mg/dL — AB (ref 65–99)
Potassium: 3.6 mmol/L (ref 3.5–5.1)
Sodium: 139 mmol/L (ref 135–145)
TOTAL PROTEIN: 7.3 g/dL (ref 6.5–8.1)

## 2016-01-05 LAB — CBC
HEMATOCRIT: 49 % — AB (ref 35.0–47.0)
Hemoglobin: 15.9 g/dL (ref 12.0–16.0)
MCH: 28.1 pg (ref 26.0–34.0)
MCHC: 32.4 g/dL (ref 32.0–36.0)
MCV: 86.6 fL (ref 80.0–100.0)
Platelets: 359 10*3/uL (ref 150–440)
RBC: 5.66 MIL/uL — ABNORMAL HIGH (ref 3.80–5.20)
RDW: 13.9 % (ref 11.5–14.5)
WBC: 12.1 10*3/uL — ABNORMAL HIGH (ref 3.6–11.0)

## 2016-01-05 LAB — WET PREP, GENITAL
Clue Cells Wet Prep HPF POC: NONE SEEN
SPERM: NONE SEEN
Yeast Wet Prep HPF POC: NONE SEEN

## 2016-01-05 LAB — CHLAMYDIA/NGC RT PCR (ARMC ONLY)
CHLAMYDIA TR: NOT DETECTED
N GONORRHOEAE: NOT DETECTED

## 2016-01-05 LAB — URINALYSIS COMPLETE WITH MICROSCOPIC (ARMC ONLY)
BACTERIA UA: NONE SEEN
BILIRUBIN URINE: NEGATIVE
GLUCOSE, UA: 150 mg/dL — AB
Ketones, ur: NEGATIVE mg/dL
NITRITE: NEGATIVE
PH: 8 (ref 5.0–8.0)
Protein, ur: 30 mg/dL — AB
SPECIFIC GRAVITY, URINE: 1.014 (ref 1.005–1.030)

## 2016-01-05 LAB — LIPASE, BLOOD: Lipase: 16 U/L (ref 11–51)

## 2016-01-05 MED ORDER — KETOROLAC TROMETHAMINE 60 MG/2ML IM SOLN
60.0000 mg | Freq: Once | INTRAMUSCULAR | Status: AC
  Administered 2016-01-05: 60 mg via INTRAMUSCULAR
  Filled 2016-01-05: qty 2

## 2016-01-05 MED ORDER — METRONIDAZOLE 500 MG PO TABS
500.0000 mg | ORAL_TABLET | Freq: Once | ORAL | Status: AC
  Administered 2016-01-05: 500 mg via ORAL
  Filled 2016-01-05: qty 1

## 2016-01-05 MED ORDER — IPRATROPIUM-ALBUTEROL 0.5-2.5 (3) MG/3ML IN SOLN
3.0000 mL | Freq: Once | RESPIRATORY_TRACT | Status: AC
  Administered 2016-01-05: 3 mL via RESPIRATORY_TRACT
  Filled 2016-01-05: qty 3

## 2016-01-05 MED ORDER — ETODOLAC 200 MG PO CAPS: 200.0000 mg | ORAL_CAPSULE | Freq: Three times a day (TID) | ORAL | Status: DC

## 2016-01-05 MED ORDER — METRONIDAZOLE 500 MG PO TABS: 500.0000 mg | ORAL_TABLET | Freq: Two times a day (BID) | ORAL | Status: AC

## 2016-01-05 NOTE — Discharge Instructions (Signed)
Abdominal Pain, Adult Many things can cause abdominal pain. Usually, abdominal pain is not caused by a disease and will improve without treatment. It can often be observed and treated at home. Your health care provider will do a physical exam and possibly order blood tests and X-rays to help determine the seriousness of your pain. However, in many cases, more time must pass before a clear cause of the pain can be found. Before that point, your health care provider may not know if you need more testing or further treatment. HOME CARE INSTRUCTIONS Monitor your abdominal pain for any changes. The following actions may help to alleviate any discomfort you are experiencing:  Only take over-the-counter or prescription medicines as directed by your health care provider.  Do not take laxatives unless directed to do so by your health care provider.  Try a clear liquid diet (broth, tea, or water) as directed by your health care provider. Slowly move to a bland diet as tolerated. SEEK MEDICAL CARE IF:  You have unexplained abdominal pain.  You have abdominal pain associated with nausea or diarrhea.  You have pain when you urinate or have a bowel movement.  You experience abdominal pain that wakes you in the night.  You have abdominal pain that is worsened or improved by eating food.  You have abdominal pain that is worsened with eating fatty foods.  You have a fever. SEEK IMMEDIATE MEDICAL CARE IF:  Your pain does not go away within 2 hours.  You keep throwing up (vomiting).  Your pain is felt only in portions of the abdomen, such as the right side or the left lower portion of the abdomen.  You pass bloody or black tarry stools. MAKE SURE YOU:  Understand these instructions.  Will watch your condition.  Will get help right away if you are not doing well or get worse.   This information is not intended to replace advice given to you by your health care provider. Make sure you discuss  any questions you have with your health care provider.   Document Released: 09/07/2005 Document Revised: 08/19/2015 Document Reviewed: 08/07/2013 Elsevier Interactive Patient Education 2016 [employer].  Sexually Transmitted Disease A sexually transmitted disease (STD) is a disease or infection that may be passed (transmitted) from person to person, usually during sexual activity. This may happen by way of saliva, semen, blood, vaginal mucus, or urine. Common STDs include:  Gonorrhea.  Chlamydia.  Syphilis.  HIV and AIDS.  Genital herpes.  Hepatitis B and C.  Trichomonas.  Human papillomavirus (HPV).  Pubic lice.  Scabies.  Mites.  Bacterial vaginosis. WHAT ARE CAUSES OF STDs? An STD may be caused by bacteria, a virus, or parasites. STDs are often transmitted during sexual activity if one person is infected. However, they may also be transmitted through nonsexual means. STDs may be transmitted after:   Sexual intercourse with an infected person.  Sharing sex toys with an infected person.  Sharing needles with an infected person or using unclean piercing or tattoo needles.  Having intimate contact with the genitals, mouth, or rectal areas of an infected person.  Exposure to infected fluids during birth. WHAT ARE THE SIGNS AND SYMPTOMS OF STDs? Different STDs have different symptoms. Some people may not have any symptoms. If symptoms are present, they may include:  Painful or bloody urination.  Pain in the pelvis, abdomen, vagina, anus, throat, or eyes.  A skin rash, itching, or irritation.  Growths, ulcerations, blisters, or sores in the  genital and anal areas.  Abnormal vaginal discharge with or without bad odor.  Penile discharge in men.  Fever.  Pain or bleeding during sexual intercourse.  Swollen glands in the groin area.  Yellow skin and eyes (jaundice). This is seen with hepatitis.  Swollen testicles.  Infertility.  Sores and blisters in  the mouth. HOW ARE STDs DIAGNOSED? To make a diagnosis, your health care provider may:  Take a medical history.  Perform a physical exam.  Take a sample of any discharge to examine.  Swab the throat, cervix, opening to the penis, rectum, or vagina for testing.  Test a sample of your first morning urine.  Perform blood tests.  Perform a Pap test, if this applies.  Perform a colposcopy.  Perform a laparoscopy. HOW ARE STDs TREATED? Treatment depends on the STD. Some STDs may be treated but not cured.  Chlamydia, gonorrhea, trichomonas, and syphilis can be cured with antibiotic medicine.  Genital herpes, hepatitis, and HIV can be treated, but not cured, with prescribed medicines. The medicines lessen symptoms.  Genital warts from HPV can be treated with medicine or by freezing, burning (electrocautery), or surgery. Warts may come back.  HPV cannot be cured with medicine or surgery. However, abnormal areas may be removed from the cervix, vagina, or vulva.  If your diagnosis is confirmed, your recent sexual partners need treatment. This is true even if they are symptom-free or have a negative culture or evaluation. They should not have sex until their health care providers say it is okay.  Your health care provider may test you for infection again 3 months after treatment. HOW CAN I REDUCE MY RISK OF GETTING AN STD? Take these steps to reduce your risk of getting an STD:  Use latex condoms, dental dams, and water-soluble lubricants during sexual activity. Do not use petroleum jelly or oils.  Avoid having multiple sex partners.  Do not have sex with someone who has other sex partners  Do not have sex with anyone you do not know or who is at high risk for an STD.  Avoid risky sex practices that can break your skin.  Do not have sex if you have open sores on your mouth or skin.  Avoid drinking too much alcohol or taking illegal drugs. Alcohol and drugs can affect your  judgment and put you in a vulnerable position.  Avoid engaging in oral and anal sex acts.  Get vaccinated for HPV and hepatitis. If you have not received these vaccines in the past, talk to your health care provider about whether one or both might be right for you.  If you are at risk of being infected with HIV, it is recommended that you take a prescription medicine daily to prevent HIV infection. This is called pre-exposure prophylaxis (PrEP). You are considered at risk if:  You are a man who has sex with other men (MSM).  You are a heterosexual man or woman and are sexually active with more than one partner.  You take drugs by injection.  You are sexually active with a partner who has HIV.  Talk with your health care provider about whether you are at high risk of being infected with HIV. If you choose to begin PrEP, you should first be tested for HIV. You should then be tested every 3 months for as long as you are taking PrEP. WHAT SHOULD I DO IF I THINK I HAVE AN STD?  See your health care provider.  Tell your  sexual partner(s). They should be tested and treated for any STDs.  Do not have sex until your health care provider says it is okay. WHEN SHOULD I GET IMMEDIATE MEDICAL CARE? Contact your health care provider right away if:   You have severe abdominal pain.  You are a man and notice swelling or pain in your testicles.  You are a woman and notice swelling or pain in your vagina.   This information is not intended to replace advice given to you by your health care provider. Make sure you discuss any questions you have with your health care provider.   Document Released: 02/18/2003 Document Revised: 12/19/2014 Document Reviewed: 06/18/2013 Elsevier Interactive Patient Education 2016 [employer].  Trichomoniasis Trichomoniasis is an infection caused by an organism called Trichomonas. The infection can affect both women and men. In women, the outer female genitalia and  the vagina are affected. In men, the penis is mainly affected, but the prostate and other reproductive organs can also be involved. Trichomoniasis is a sexually transmitted infection (STI) and is most often passed to another person through sexual contact.  RISK FACTORS  Having unprotected sexual intercourse.  Having sexual intercourse with an infected partner. SIGNS AND SYMPTOMS  Symptoms of trichomoniasis in women include:  Abnormal gray-green frothy vaginal discharge.  Itching and irritation of the vagina.  Itching and irritation of the area outside the vagina. Symptoms of trichomoniasis in men include:   Penile discharge with or without pain.  Pain during urination. This results from inflammation of the urethra. DIAGNOSIS  Trichomoniasis may be found during a Pap test or physical exam. Your health care provider may use one of the following methods to help diagnose this infection:  Testing the pH of the vagina with a test tape.  Using a vaginal swab test that checks for the Trichomonas organism. A test is available that provides results within a few minutes.  Examining a urine sample.  Testing vaginal secretions. Your health care provider may test you for other STIs, including HIV. TREATMENT   You may be given medicine to fight the infection. Women should inform their health care provider if they could be or are pregnant. Some medicines used to treat the infection should not be taken during pregnancy.  Your health care provider may recommend over-the-counter medicines or creams to decrease itching or irritation.  Your sexual partner will need to be treated if infected.  Your health care provider may test you for infection again 3 months after treatment. HOME CARE INSTRUCTIONS   Take medicines only as directed by your health care provider.  Take over-the-counter medicine for itching or irritation as directed by your health care provider.  Do not have sexual intercourse  while you have the infection.  Women should not douche or wear tampons while they have the infection.  Discuss your infection with your partner. Your partner may have gotten the infection from you, or you may have gotten it from your partner.  Have your sex partner get examined and treated if necessary.  Practice safe, informed, and protected sex.  See your health care provider for other STI testing. SEEK MEDICAL CARE IF:   You still have symptoms after you finish your medicine.  You develop abdominal pain.  You have pain when you urinate.  You have bleeding after sexual intercourse.  You develop a rash.  Your medicine makes you sick or makes you throw up (vomit). MAKE SURE YOU:  Understand these instructions.  Will watch your condition.  Will get help right away if you are not doing well or get worse.   This information is not intended to replace advice given to you by your health care provider. Make sure you discuss any questions you have with your health care provider.   Document Released: 05/24/2001 Document Revised: 12/19/2014 Document Reviewed: 09/09/2013 Elsevier Interactive Patient Education [employer]

## 2016-01-05 NOTE — ED Provider Notes (Signed)
[facility] Emergency Department Provider Note  ____________________________________________  Time seen: Approximately 511 AM  I have reviewed the triage vital signs and the nursing notes.   HISTORY  Chief Complaint Abdominal Pain    HPI Samantha [name] is a 50 y.o. female who comes into the hospital today with upset stomach. The patient reports that she's been having these symptoms all week. The patient reports that she's also had a yellow discharge in her underwear and is making a week on her stomach. The patient does not have an OB/GYN so she has not seen another physician. The patient denies any vomiting but has had some nausea. She also denies any burning with urination or fevers. The patient has had some itching and a fishy vaginal odor. The patient is sexually active and does not use protection.The patient also still has her uterus and ovaries but has gone through menopause along time ago. The patient reports that her pain is in her right lower abdomen and is 8 out of 10 in intensity.   Past Medical History  Diagnosis Date  . Asthma   . Hypertension   . Diabetes mellitus without complication (HCC)   . COPD (chronic obstructive pulmonary disease) [facility])     Patient Active Problem List   Diagnosis Date Noted  . Acute on chronic respiratory failure (HCC) 10/06/2015  . Acute on chronic respiratory failure with hypoxemia (HCC) 08/30/2015    Past Surgical History  Procedure Laterality Date  . None      Current Outpatient Rx  Name  Route  Sig  Dispense  Refill  . albuterol (PROVENTIL HFA;VENTOLIN HFA) 108 (90 BASE) MCG/ACT inhaler      Inhale 4-6 puffs by mouth every 4 hours as needed for wheezing, cough, and/or shortness of breath Patient taking differently: Inhale 2 puffs into the lungs every 6 (six) hours as needed for wheezing or shortness of breath.    1 Inhaler   1   . amoxicillin-clavulanate (AUGMENTIN) 875-125 MG tablet   Oral   Take 1  tablet by mouth 2 (two) times daily. for 10 days      0   . gabapentin (NEURONTIN) 300 MG capsule   Oral   Take 300 mg by mouth 2 (two) times daily.      0   . losartan-hydrochlorothiazide (HYZAAR) 50-12.5 MG per tablet   Oral   Take 2 tablets by mouth daily. Patient taking differently: Take 1 tablet by mouth daily.    60 tablet   0   . metFORMIN (GLUCOPHAGE) 850 MG tablet   Oral   Take 1 tablet by mouth daily.      0   . montelukast (SINGULAIR) 10 MG tablet   Oral   Take 1 tablet by mouth at bedtime.      0   . SYMBICORT 160-4.5 MCG/ACT inhaler   Inhalation   Inhale 2 puffs into the lungs every 12 (twelve) hours.   1 Inhaler   3     Dispense as written.   . tiotropium (SPIRIVA HANDIHALER) 18 MCG inhalation capsule   Inhalation   Place 1 capsule (18 mcg total) into inhaler and inhale every morning.   30 capsule   12   . amoxicillin (AMOXIL) 500 MG tablet   Oral   Take 1 tablet (500 mg total) by mouth 3 (three) times daily. Patient not taking: Reported on 01/05/2016   21 tablet   0   . metroNIDAZOLE (FLAGYL) 500 MG  tablet   Oral   Take 1 tablet (500 mg total) by mouth 2 (two) times daily.   14 tablet   0   . predniSONE (DELTASONE) 20 MG tablet   Oral   Take 1 tablet (20 mg total) by mouth daily with breakfast. Take 3 tablets a day for 3 days, 2 tablets for 3 days, 1 tablet for 3 days and then stop. Patient not taking: Reported on 12/04/2015   20 tablet   0   . predniSONE (DELTASONE) 20 MG tablet   Oral   Take 3 tablets (60 mg total) by mouth daily. Patient not taking: Reported on 12/04/2015   12 tablet   0   . predniSONE (DELTASONE) 20 MG tablet      Take 3 tablets on Saturday then 2 tablets on Sunday, Monday, and Tuesday, then 1 tablet on Wednesday, Thursday, and Friday. Patient not taking: Reported on 01/05/2016   12 tablet   1     Allergies Percocet  Family History  Problem Relation Age of Onset  . Asthma Mother   . Asthma Sister      Social History Social History  Substance Use Topics  . Smoking status: Current Every Day Smoker -- 1.00 packs/day for 23 years    Types: Cigarettes  . Smokeless tobacco: Never Used  . Alcohol Use: Yes    Review of Systems Constitutional: No fever/chills Eyes: No visual changes. ENT: No sore throat. Cardiovascular: Denies chest pain. Respiratory: Denies shortness of breath. Gastrointestinal: abdominal pain. nausea, no vomiting.  No diarrhea.  No constipation. Genitourinary: Negative for dysuria. Musculoskeletal: Negative for back pain. Skin: Negative for rash. Neurological: Negative for headaches, focal weakness or numbness.  10-point ROS otherwise negative.  ____________________________________________   PHYSICAL EXAM:  VITAL SIGNS: ED Triage Vitals  Enc Vitals Group     BP 01/05/16 0313 114/77 mmHg     Pulse Rate 01/05/16 0313 103     Resp 01/05/16 0313 22     Temp 01/05/16 0313 98.2 F (36.8 C)     Temp Source 01/05/16 0313 Oral     SpO2 01/05/16 0313 94 %     Weight 01/05/16 0313 269 lb (122.018 kg)     Height 01/05/16 0313  (1.702 m)     Head Cir --      Peak Flow --      Pain Score 01/05/16 0312 9     Pain Loc --      Pain Edu? --      Excl. in GC? --     Constitutional: Alert and oriented. Well appearing and in mild distress. Eyes: Conjunctivae are normal. PERRL. EOMI. Head: Atraumatic. Nose: No congestion/rhinnorhea. Mouth/Throat: Mucous membranes are moist.  Oropharynx non-erythematous. Cardiovascular: Normal rate, regular rhythm. Grossly normal heart sounds.  Good peripheral circulation. Respiratory: Normal respiratory effort.  No retractions. Wheezes throughout all lung fields Gastrointestinal: Soft with lower abd tenderness to palpation. No distention. Positive bowel sounds Genitourinary: normal external genitalia, thick yellow discharge in vault, normal cervix, no CMT, right adnexal tenderness Musculoskeletal: No lower extremity  tenderness nor edema.   Neurologic:  Normal speech and language.  Skin:  Skin is warm, dry and intact.  Psychiatric: Mood and affect are normal.   ____________________________________________   LABS (all labs ordered are listed, but only abnormal results are displayed)  Labs Reviewed  WET PREP, GENITAL - Abnormal; Notable for the following:    Trich, Wet Prep PRESENT (*)    WBC, Wet  Prep HPF POC MANY (*)    All other components within normal limits  COMPREHENSIVE METABOLIC PANEL - Abnormal; Notable for the following:    Glucose, Bld 151 (*)    Creatinine, Ser 1.66 (*)    ALT 61 (*)    GFR calc non Af Amer 35 (*)    GFR calc Af Amer 41 (*)    All other components within normal limits  CBC - Abnormal; Notable for the following:    WBC 12.1 (*)    RBC 5.66 (*)    HCT 49.0 (*)    All other components within normal limits  URINALYSIS COMPLETEWITH MICROSCOPIC ([facility] ONLY) - Abnormal; Notable for the following:    Color, Urine YELLOW (*)    APPearance CLOUDY (*)    Glucose, UA 150 (*)    Hgb urine dipstick 1+ (*)    Protein, ur 30 (*)    Leukocytes, UA 3+ (*)    Squamous Epithelial / LPF TOO NUMEROUS TO COUNT (*)    All other components within normal limits  CHLAMYDIA/NGC RT PCR ([facility] ONLY)  LIPASE, BLOOD   ____________________________________________  EKG  none ____________________________________________  RADIOLOGY  US pelvis: No acute abnormality ____________________________________________   PROCEDURES  Procedure(s) performed: None  Critical Care performed: No  ____________________________________________   INITIAL IMPRESSION / ASSESSMENT AND PLAN / ED COURSE  Pertinent labs & imaging results that were available during my care of the patient were reviewed by me and considered in my medical decision making (see chart for details).  This is a 50 year old female who comes in with lower abdominal pain and vaginal discharge. The patient has very  dirty appearing urine and also has Trichomonas present on her wet prep. If in the pain the patient receive an ultrasound as well as some metronidazole. I will also give the patient DuoNeb for her wheezing and reassess the patient when she's receive her medication.  The patient feels improved and i explained the patient's symptoms and treatments. The patient will be discharged to home to follow up with OB/GYN  ____________________________________________   FINAL CLINICAL IMPRESSION(S) / ED DIAGNOSES  Final diagnoses:  Abdominal pain  Trichomonas vaginitis      Rebecka Apley, MD 01/05/16 [phone]

## 2016-01-05 NOTE — ED Notes (Signed)
Pt to triage via w/c with no distress noted, brought in by EMS with c/o dark yellow vaginal discharge and lower abd pain, diarrhea

## 2016-01-05 NOTE — ED Notes (Signed)
Patient transported to Ultrasound 

## 2016-02-22 IMAGING — CR DG CHEST 2V
1 series · 2 of 2 positions shown · non-contrast
Comparison: 06/28/2014

CLINICAL DATA: Productive cough with yellow phlegm.

EXAM:
CHEST  2 VIEW

[Series 1: dxr chest pa (or ap) and lateral · 0.14mm/px · 2 of 2 slices shown]
[im 1/2]
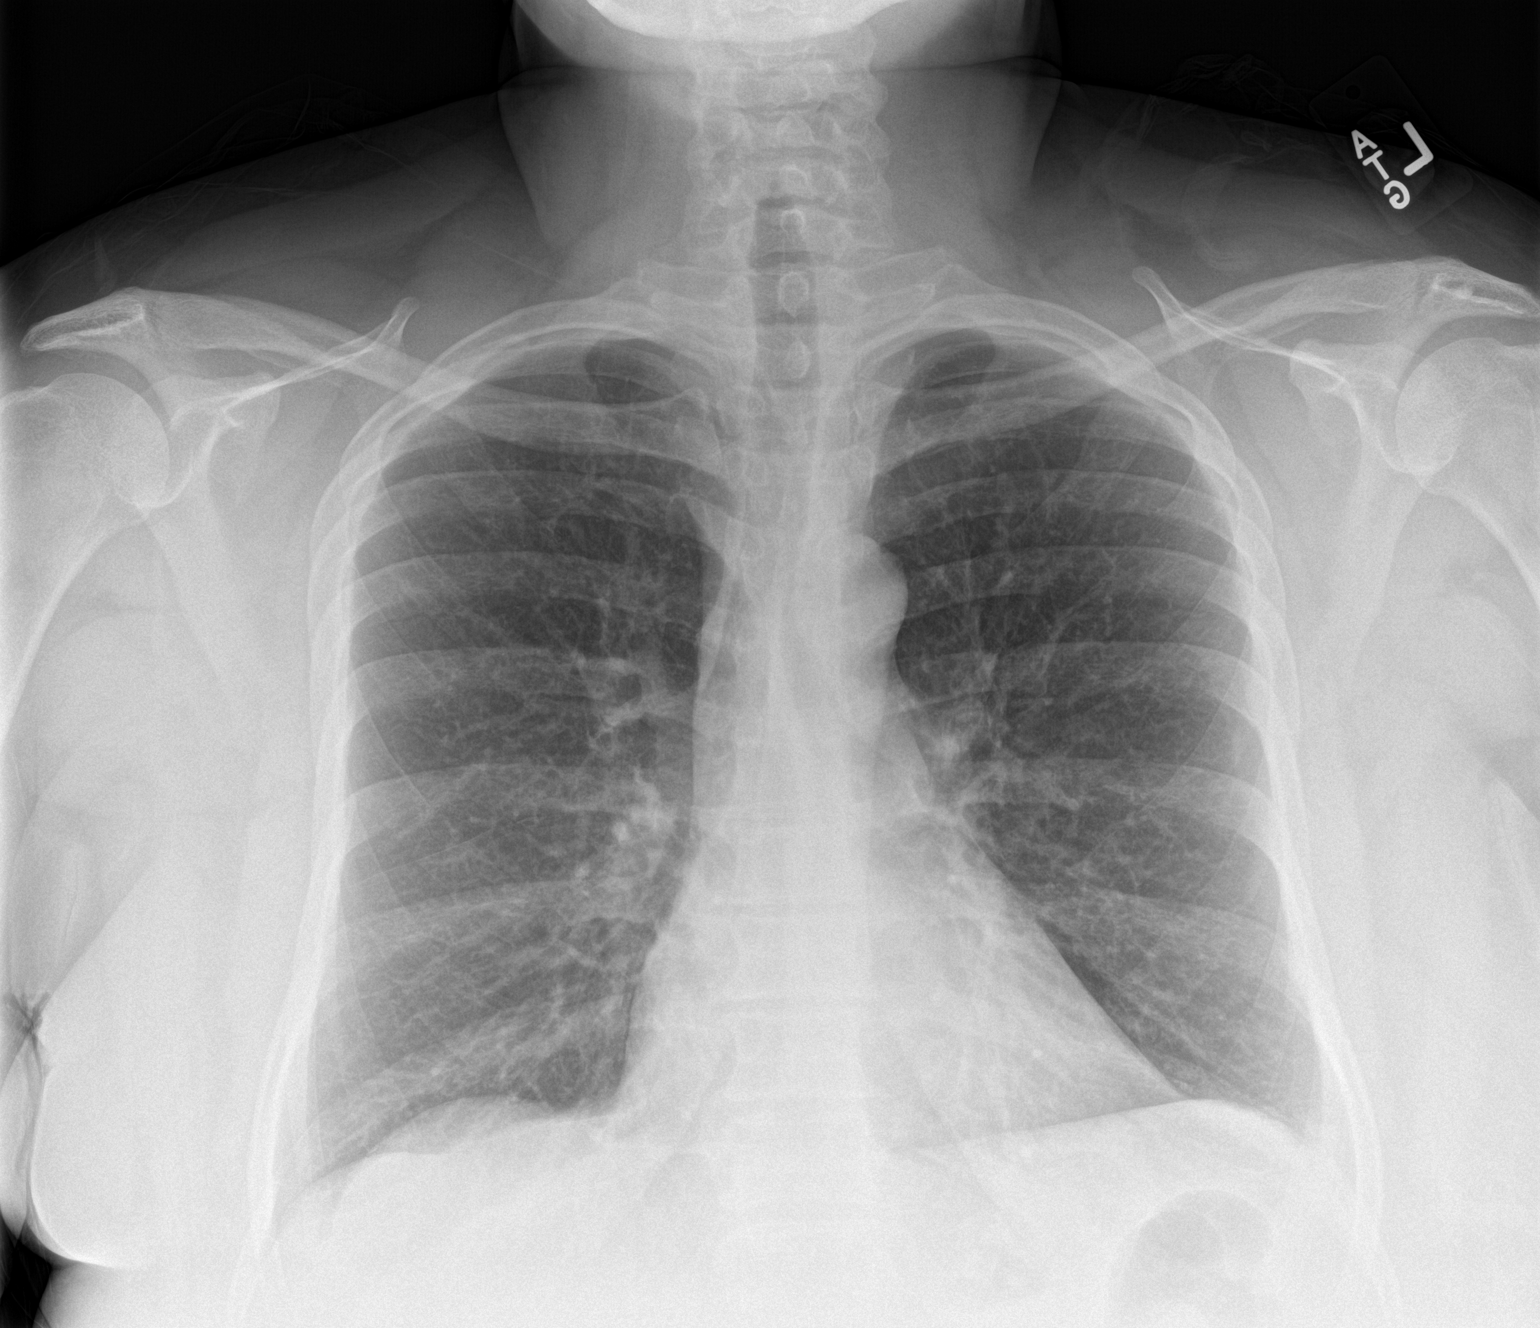
[im 2/2]
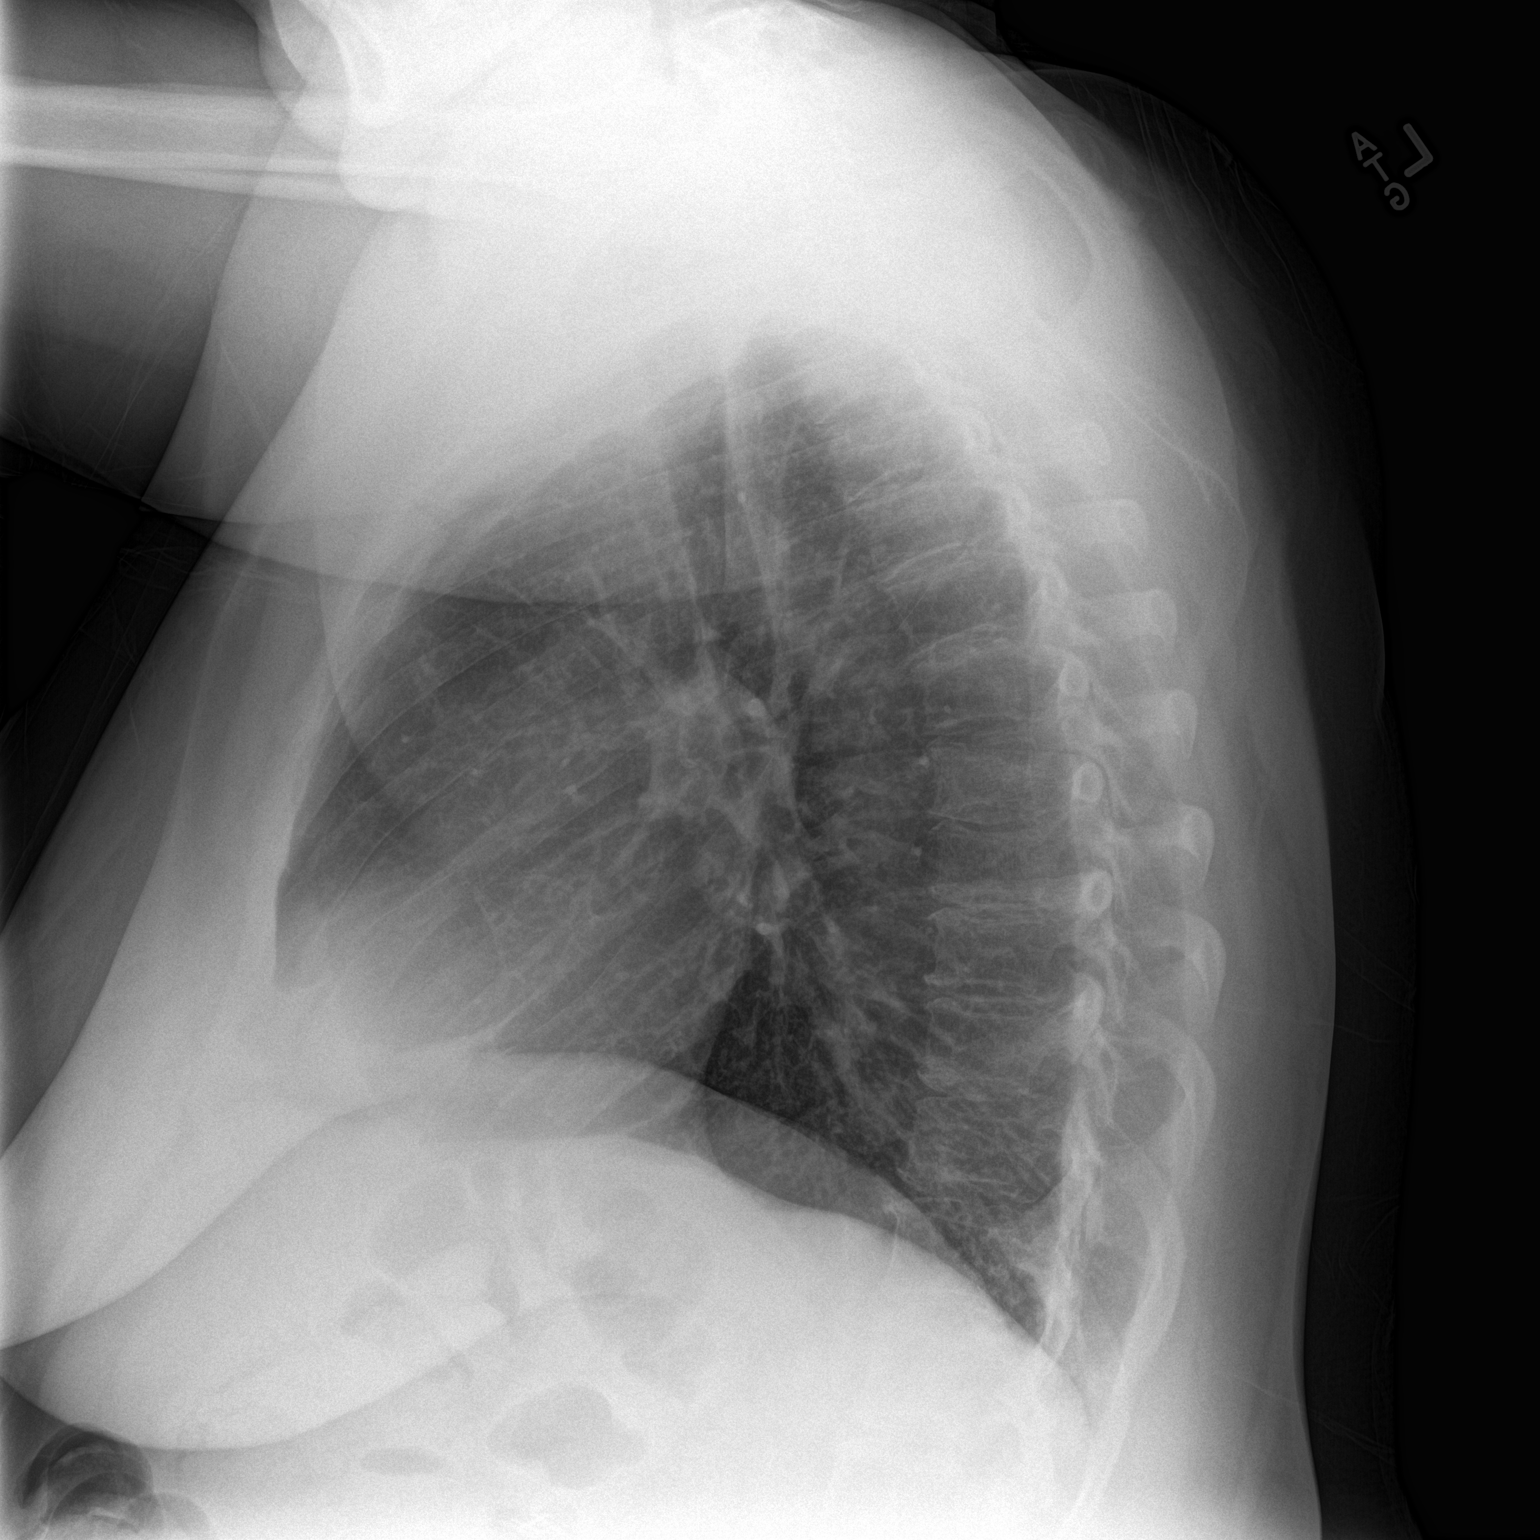

[2 of 2 positions shown; findings below may reference images not displayed]

FINDINGS: Normal heart size and mediastinal contours. No acute infiltrate or
edema. No effusion or pneumothorax. No acute osseous findings.
IMPRESSION: No active cardiopulmonary disease.

## 2016-03-05 ENCOUNTER — Emergency Department
Admission: EM | Admit: 2016-03-05 | Discharge: 2016-03-05 | Disposition: A | Discharge status: Home / Self Care | Payer: Medicare Other | Attending: Emergency Medicine | Admitting: Emergency Medicine

## 2016-03-05 ENCOUNTER — Emergency Department: Payer: Medicare Other

## 2016-03-05 DIAGNOSIS — E119 Type 2 diabetes mellitus without complications: Secondary | ICD-10-CM | POA: Insufficient documentation

## 2016-03-05 DIAGNOSIS — R0602 Shortness of breath: Secondary | ICD-10-CM | POA: Diagnosis not present

## 2016-03-05 DIAGNOSIS — J441 Chronic obstructive pulmonary disease with (acute) exacerbation: Secondary | ICD-10-CM | POA: Insufficient documentation

## 2016-03-05 DIAGNOSIS — Z7984 Long term (current) use of oral hypoglycemic drugs: Secondary | ICD-10-CM | POA: Diagnosis not present

## 2016-03-05 DIAGNOSIS — J209 Acute bronchitis, unspecified: Secondary | ICD-10-CM | POA: Diagnosis not present

## 2016-03-05 DIAGNOSIS — F17210 Nicotine dependence, cigarettes, uncomplicated: Secondary | ICD-10-CM | POA: Diagnosis not present

## 2016-03-05 DIAGNOSIS — I10 Essential (primary) hypertension: Secondary | ICD-10-CM | POA: Insufficient documentation

## 2016-03-05 DIAGNOSIS — J440 Chronic obstructive pulmonary disease with acute lower respiratory infection: Secondary | ICD-10-CM | POA: Diagnosis not present

## 2016-03-05 DIAGNOSIS — R05 Cough: Secondary | ICD-10-CM | POA: Diagnosis not present

## 2016-03-05 DIAGNOSIS — IMO0001 Reserved for inherently not codable concepts without codable children: Secondary | ICD-10-CM

## 2016-03-05 DIAGNOSIS — Z79899 Other long term (current) drug therapy: Secondary | ICD-10-CM | POA: Diagnosis not present

## 2016-03-05 LAB — BASIC METABOLIC PANEL
Anion gap: 8 (ref 5–15)
BUN: 21 mg/dL — AB (ref 6–20)
CALCIUM: 9.3 mg/dL (ref 8.9–10.3)
CO2: 23 mmol/L (ref 22–32)
CREATININE: 1.49 mg/dL — AB (ref 0.44–1.00)
Chloride: 105 mmol/L (ref 101–111)
GFR calc Af Amer: 46 mL/min — ABNORMAL LOW (ref >60)
GFR, EST NON AFRICAN AMERICAN: 40 mL/min — AB (ref >60)
GLUCOSE: 139 mg/dL — AB (ref 65–99)
POTASSIUM: 4 mmol/L (ref 3.5–5.1)
SODIUM: 136 mmol/L (ref 135–145)

## 2016-03-05 LAB — CBC WITH DIFFERENTIAL/PLATELET
BASOS PCT: 1 %
Basophils Absolute: 0.1 10*3/uL (ref 0–0.1)
EOS PCT: 7 %
Eosinophils Absolute: 0.6 10*3/uL (ref 0–0.7)
HEMATOCRIT: 46 % (ref 35.0–47.0)
Hemoglobin: 15.3 g/dL (ref 12.0–16.0)
Lymphocytes Relative: 34 %
Lymphs Abs: 3.2 10*3/uL (ref 1.0–3.6)
MCH: 29 pg (ref 26.0–34.0)
MCHC: 33.3 g/dL (ref 32.0–36.0)
MCV: 87 fL (ref 80.0–100.0)
MONO ABS: 0.6 10*3/uL (ref 0.2–0.9)
MONOS PCT: 7 %
NEUTROS ABS: 4.9 10*3/uL (ref 1.4–6.5)
Neutrophils Relative %: 51 %
PLATELETS: 352 10*3/uL (ref 150–440)
RBC: 5.29 MIL/uL — ABNORMAL HIGH (ref 3.80–5.20)
RDW: 14.1 % (ref 11.5–14.5)
WBC: 9.4 10*3/uL (ref 3.6–11.0)

## 2016-03-05 LAB — MAGNESIUM: Magnesium: 2.1 mg/dL (ref 1.7–2.4)

## 2016-03-05 MED ORDER — SODIUM CHLORIDE 0.9 % IV BOLUS (SEPSIS)
500.0000 mL | Freq: Once | INTRAVENOUS | Status: AC
  Administered 2016-03-05: 500 mL via INTRAVENOUS

## 2016-03-05 MED ORDER — METHYLPREDNISOLONE SODIUM SUCC 125 MG IJ SOLR
125.0000 mg | Freq: Once | INTRAMUSCULAR | Status: AC
  Administered 2016-03-05: 125 mg via INTRAVENOUS

## 2016-03-05 MED ORDER — ALBUTEROL SULFATE (2.5 MG/3ML) 0.083% IN NEBU
INHALATION_SOLUTION | RESPIRATORY_TRACT | Status: AC
  Filled 2016-03-05: qty 12

## 2016-03-05 MED ORDER — AZITHROMYCIN 250 MG PO TABS: ORAL_TABLET | ORAL | Status: DC

## 2016-03-05 MED ORDER — ALBUTEROL SULFATE (2.5 MG/3ML) 0.083% IN NEBU
INHALATION_SOLUTION | RESPIRATORY_TRACT | Status: AC
  Filled 2016-03-05: qty 3

## 2016-03-05 MED ORDER — METHYLPREDNISOLONE SODIUM SUCC 125 MG IJ SOLR
INTRAMUSCULAR | Status: AC
  Administered 2016-03-05: 125 mg via INTRAVENOUS
  Filled 2016-03-05: qty 2

## 2016-03-05 MED ORDER — PREDNISONE 20 MG PO TABS: 40.0000 mg | ORAL_TABLET | Freq: Every day | ORAL | Status: AC

## 2016-03-05 MED ORDER — ALBUTEROL (5 MG/ML) CONTINUOUS INHALATION SOLN
10.0000 mg/h | INHALATION_SOLUTION | RESPIRATORY_TRACT | Status: DC
  Administered 2016-03-05: 10 mg/h via RESPIRATORY_TRACT
  Filled 2016-03-05: qty 20

## 2016-03-05 NOTE — Discharge Instructions (Signed)
Chronic Obstructive Pulmonary Disease Exacerbation Chronic obstructive pulmonary disease (COPD) is a common lung condition in which airflow from the lungs is limited. COPD is a general term that can be used to describe many different lung problems that limit airflow, including chronic bronchitis and emphysema. COPD exacerbations are episodes when breathing symptoms become much worse and require extra treatment. Without treatment, COPD exacerbations can be life threatening, and frequent COPD exacerbations can cause further damage to your lungs. CAUSES  Respiratory infections.  Exposure to smoke.  Exposure to air pollution, chemical fumes, or dust. Sometimes there is no apparent cause or trigger. RISK FACTORS  Smoking cigarettes.  Older age.  Frequent prior COPD exacerbations. SIGNS AND SYMPTOMS  Increased coughing.  Increased thick spit (sputum) production.  Increased wheezing.  Increased shortness of breath.  Rapid breathing.  Chest tightness. DIAGNOSIS Your medical history, a physical exam, and tests will help your health care provider make a diagnosis. Tests may include:  A chest X-ray.  Basic lab tests.  Sputum testing.  An arterial blood gas test. TREATMENT Depending on the severity of your COPD exacerbation, you may need to be admitted to a hospital for treatment. Some of the treatments commonly used to treat COPD exacerbations are:   Antibiotic medicines.  Bronchodilators. These are drugs that expand the air passages. They may be given with an inhaler or nebulizer. Spacer devices may be needed to help improve drug delivery.  Corticosteroid medicines.  Supplemental oxygen therapy.  Airway clearing techniques, such as noninvasive ventilation (NIV) and positive expiratory pressure (PEP). These provide respiratory support through a mask or other noninvasive device. HOME CARE INSTRUCTIONS  Do not smoke. Quitting smoking is very important to prevent COPD from  getting worse and exacerbations from happening as often.  Avoid exposure to all substances that irritate the airway, especially to tobacco smoke.  If you were prescribed an antibiotic medicine, finish it all even if you start to feel better.  Take all medicines as directed by your health care provider.It is important to use correct technique with inhaled medicines.  Drink enough fluids to keep your urine clear or pale yellow (unless you have a medical condition that requires fluid restriction).  Use a cool mist vaporizer. This makes it easier to clear your chest when you cough.  If you have a home nebulizer and oxygen, continue to use them as directed.  Maintain all necessary vaccinations to prevent infections.  Exercise regularly.  Eat a healthy diet.  Keep all follow-up appointments as directed by your health care provider. SEEK IMMEDIATE MEDICAL CARE IF:  You have worsening shortness of breath.  You have trouble talking.  You have severe chest pain.  You have blood in your sputum.  You have a fever.  You have weakness, vomit repeatedly, or faint.  You feel confused.  You continue to get worse. MAKE SURE YOU:  Understand these instructions.  Will watch your condition.  Will get help right away if you are not doing well or get worse.   This information is not intended to replace advice given to you by your health care provider. Make sure you discuss any questions you have with your health care provider.   Document Released: 09/25/2007 Document Revised: 12/19/2014 Document Reviewed: 08/02/2013 Elsevier Interactive Patient Education [employer]2016 [employer]  Please return immediately if condition worsens. Please contact her primary physician or the physician you were given for referral. If you have any specialist physicians involved in her treatment and plan please also contact  them. Thank you for using [facility]. ° °

## 2016-03-05 NOTE — ED Provider Notes (Signed)
Time Seen: Approximately 2130 I have reviewed the triage notes  Chief Complaint: Shortness of Breath   History of Present Illness: Samantha [name] is a 50 y.o. female who presents via EMS with increasing shortness of breath. Patient has a history of COPD and is on 2 L nasal cannula at night. She states she's had some increasing shortness of breath over the last 2-3 days. She was given herself at home a total of 11 duo nebs over the last couple of days without symptomatic relief. She states her shortness of breath was mainly with some any form of activity at home. Patient denies any fever she states her cough has been occasionally productive with some yellow tinged sputum. She states steroids work well and she essentially felt like she needed some steroid therapy. She denies any chest pain. Past Medical History  Diagnosis Date  . Asthma   . Hypertension   . Diabetes mellitus without complication (HCC)   . COPD (chronic obstructive pulmonary disease) [facility])     Patient Active Problem List   Diagnosis Date Noted  . Acute on chronic respiratory failure (HCC) 10/06/2015  . Acute on chronic respiratory failure with hypoxemia (HCC) 08/30/2015    Past Surgical History  Procedure Laterality Date  . None      Past Surgical History  Procedure Laterality Date  . None      Current Outpatient Rx  Name  Route  Sig  Dispense  Refill  . albuterol (PROVENTIL HFA;VENTOLIN HFA) 108 (90 BASE) MCG/ACT inhaler      Inhale 4-6 puffs by mouth every 4 hours as needed for wheezing, cough, and/or shortness of breath Patient taking differently: Inhale 2 puffs into the lungs every 6 (six) hours as needed for wheezing or shortness of breath.    1 Inhaler   1   . etodolac (LODINE) 200 MG capsule   Oral   Take 1 capsule (200 mg total) by mouth every 8 (eight) hours.   12 capsule   0   . gabapentin (NEURONTIN) 300 MG capsule   Oral   Take 300 mg by mouth 2 (two) times daily.      0   .  losartan-hydrochlorothiazide (HYZAAR) 50-12.5 MG per tablet   Oral   Take 2 tablets by mouth daily. Patient taking differently: Take 1 tablet by mouth daily.    60 tablet   0   . metFORMIN (GLUCOPHAGE) 850 MG tablet   Oral   Take 1 tablet by mouth daily.      0   . montelukast (SINGULAIR) 10 MG tablet   Oral   Take 1 tablet by mouth at bedtime.      0   . SYMBICORT 160-4.5 MCG/ACT inhaler   Inhalation   Inhale 2 puffs into the lungs every 12 (twelve) hours.   1 Inhaler   3     Dispense as written.   . tiotropium (SPIRIVA HANDIHALER) 18 MCG inhalation capsule   Inhalation   Place 1 capsule (18 mcg total) into inhaler and inhale every morning.   30 capsule   12   . amoxicillin (AMOXIL) 500 MG tablet   Oral   Take 1 tablet (500 mg total) by mouth 3 (three) times daily. Patient not taking: Reported on 01/05/2016   21 tablet   0   . predniSONE (DELTASONE) 20 MG tablet   Oral   Take 1 tablet (20 mg total) by mouth daily with breakfast. Take 3 tablets a day  for 3 days, 2 tablets for 3 days, 1 tablet for 3 days and then stop. Patient not taking: Reported on 12/04/2015   20 tablet   0   . predniSONE (DELTASONE) 20 MG tablet   Oral   Take 3 tablets (60 mg total) by mouth daily. Patient not taking: Reported on 12/04/2015   12 tablet   0   . predniSONE (DELTASONE) 20 MG tablet      Take 3 tablets on Saturday then 2 tablets on Sunday, Monday, and Tuesday, then 1 tablet on Wednesday, Thursday, and Friday. Patient not taking: Reported on 01/05/2016   12 tablet   1     Allergies:  Percocet  Family History: Family History  Problem Relation Age of Onset  . Asthma Mother   . Asthma Sister     Social History: Social History  Substance Use Topics  . Smoking status: Current Every Day Smoker -- 1.00 packs/day for 23 years    Types: Cigarettes  . Smokeless tobacco: Never Used  . Alcohol Use: Yes     Review of Systems:   10 point review of systems was  performed and was otherwise negative:  Constitutional: No fever Eyes: No visual disturbances ENT: No sore throat, ear pain Cardiac: No chest pain Respiratory: Increasing shortness of breath with some mild hoarseness of voice and some audible wheezing at home  Abdomen: No abdominal pain, no vomiting, No diarrhea Endocrine: No weight loss, No night sweats Extremities: No peripheral edema, cyanosis Skin: No rashes, easy bruising Neurologic: No focal weakness, trouble with speech or swollowing Urologic: No dysuria, Hematuria, or urinary frequency   Physical Exam:  ED Triage Vitals  Enc Vitals Group     BP 03/05/16 2033 154/96 mmHg     Pulse Rate 03/05/16 2033 105     Resp 03/05/16 2033 21     Temp 03/05/16 2033 98.5 F (36.9 C)     Temp src --      SpO2 03/05/16 2033 99 %     Weight --      Height --      Head Cir --      Peak Flow --      Pain Score 03/05/16 2041 0     Pain Loc --      Pain Edu? --      Excl. in GC? --     General: Awake , Alert , and Oriented times 3; GCS 15 Head: Normal cephalic , atraumatic Eyes: Pupils equal , round, reactive to light Nose/Throat: No nasal drainage, patent upper airway without erythema or exudate.  Neck: Supple, Full range of motion, No anterior adenopathy or palpable thyroid masses Lungs: Diffuse symmetric wheezing heard in all lung fields but especially at the apices without rhonchi or rales Heart: Regular rate, regular rhythm without murmurs , gallops , or rubs Abdomen: Soft, non tender without rebound, guarding , or rigidity; bowel sounds positive and symmetric in all 4 quadrants. No organomegaly .        Extremities: 2 plus symmetric pulses. No edema, clubbing or cyanosis Neurologic: normal ambulation, Motor symmetric without deficits, sensory intact Skin: warm, dry, no rashes   Labs:   All laboratory work was reviewed including any pertinent negatives or positives listed below:  Labs Reviewed  BASIC METABOLIC PANEL -  Abnormal; Notable for the following:    Glucose, Bld 139 (*)    BUN 21 (*)    Creatinine, Ser 1.49 (*)    GFR calc  non Af Amer 40 (*)    GFR calc Af Amer 46 (*)    All other components within normal limits  CBC WITH DIFFERENTIAL/PLATELET - Abnormal; Notable for the following:    RBC 5.29 (*)    All other components within normal limits  MAGNESIUM  Patient's creatinine is elevated but review of previous laboratory work shows a consistently elevated creatinine in the past  EKG: ED ECG REPORT I, Jennye MoccasinBrian S Amiee Wiley, the attending physician, personally viewed and interpreted this ECG.  Date: 03/05/2016 EKG Time: 2040 Rate: 102 Rhythm: normal sinus rhythm QRS Axis: normal Intervals: normal ST/T Wave abnormalities: normal Conduction Disturbances: none Narrative Interpretation: unremarkable Final result by Rad Results In Interface (03/05/16 21:21:56)   Narrative:   CLINICAL DATA: Cough, shortness of breath  EXAM: CHEST 2 VIEW  COMPARISON: 11/10/2015  FINDINGS: Lungs are clear. No pleural effusion or pneumothorax.  The heart is normal in size.  Visualized osseous structures are within normal limits.  IMPRESSION: Normal chest radiographs.   Electronically Signed By: Charline BillsSriyesh Krishnan M.D. On: 03/05/2016 21:21    No acute ischemic changes   Radiology:      I personally reviewed the radiologic studies    ED Course:  Patient's stay here showed symptomatic improvement and she was placed on a continuous nebulization therapy and given IV Solu-Medrol. Patient states she feels well enough to go home. Repeat exam shows clearing of the majority of her wheezing except at the apices. His nasal cannula at home and she was advised to use that as needed. She will be placed on a prednisone bolus. I felt given the productive nature of her cough and her history of COPD that we would also establish her on outpatient antibiotic therapy   Assessment:  Acute exacerbation  of chronic obstructive pulmonary disease    Plan:  Outpatient management Patient was advised to return immediately if condition worsens. Patient was advised to follow up with their primary care physician or other specialized physicians involved in their outpatient care. The patient and/or family member/power of attorney had laboratory results reviewed at the bedside. All questions and concerns were addressed and appropriate discharge instructions were distributed by the nursing staff. Jennye Moccasin*            Daveigh Batty S Jayquan Bradsher, MD 03/05/16 [phone]

## 2016-03-05 NOTE — ED Notes (Signed)
Pt bib EMS w/ c/o SOB.  Pt sts that SOB began 2-3 days ago but got worse today.  Pt sts she took 11 duo nebs at home and is here for steroids.  Pt n/v/d, fever and CP.  Pt hoarse and visibly anxious.  Pt received 1 duo neb via EMS.

## 2016-03-05 NOTE — ED Notes (Signed)
Discharge instructions reviewed with pt and pt verbalizes understanding. Pt vital signs taken for discharge and blood pressure noted to be 172/122. This RN went out of room to inform MD of pt's blood pressure prior to discharge. Pt stepped to the door and stated she was leaving. Pt did not wait to sign discharge. Pt

## 2016-03-23 ENCOUNTER — Encounter: Payer: Self-pay | Admitting: *Deleted

## 2016-03-23 ENCOUNTER — Emergency Department
Admission: EM | Admit: 2016-03-23 | Discharge: 2016-03-23 | Disposition: A | Discharge status: Home / Self Care | Payer: Medicare Other | Attending: Student | Admitting: Student

## 2016-03-23 ENCOUNTER — Emergency Department: Payer: Medicare Other

## 2016-03-23 DIAGNOSIS — J9621 Acute and chronic respiratory failure with hypoxia: Secondary | ICD-10-CM | POA: Diagnosis not present

## 2016-03-23 DIAGNOSIS — E119 Type 2 diabetes mellitus without complications: Secondary | ICD-10-CM | POA: Diagnosis not present

## 2016-03-23 DIAGNOSIS — Z791 Long term (current) use of non-steroidal anti-inflammatories (NSAID): Secondary | ICD-10-CM | POA: Diagnosis not present

## 2016-03-23 DIAGNOSIS — R0602 Shortness of breath: Secondary | ICD-10-CM | POA: Diagnosis not present

## 2016-03-23 DIAGNOSIS — J441 Chronic obstructive pulmonary disease with (acute) exacerbation: Secondary | ICD-10-CM | POA: Diagnosis not present

## 2016-03-23 DIAGNOSIS — J45909 Unspecified asthma, uncomplicated: Secondary | ICD-10-CM | POA: Insufficient documentation

## 2016-03-23 DIAGNOSIS — I10 Essential (primary) hypertension: Secondary | ICD-10-CM | POA: Diagnosis not present

## 2016-03-23 DIAGNOSIS — R05 Cough: Secondary | ICD-10-CM | POA: Diagnosis not present

## 2016-03-23 DIAGNOSIS — Z79899 Other long term (current) drug therapy: Secondary | ICD-10-CM | POA: Diagnosis not present

## 2016-03-23 DIAGNOSIS — F17210 Nicotine dependence, cigarettes, uncomplicated: Secondary | ICD-10-CM | POA: Diagnosis not present

## 2016-03-23 DIAGNOSIS — Z7984 Long term (current) use of oral hypoglycemic drugs: Secondary | ICD-10-CM | POA: Insufficient documentation

## 2016-03-23 LAB — COMPREHENSIVE METABOLIC PANEL
ALBUMIN: 3.7 g/dL (ref 3.5–5.0)
ALT: 54 U/L (ref 14–54)
AST: 40 U/L (ref 15–41)
Alkaline Phosphatase: 70 U/L (ref 38–126)
Anion gap: 7 (ref 5–15)
BILIRUBIN TOTAL: 0.6 mg/dL (ref 0.3–1.2)
BUN: 18 mg/dL (ref 6–20)
CALCIUM: 9.1 mg/dL (ref 8.9–10.3)
CHLORIDE: 107 mmol/L (ref 101–111)
CO2: 24 mmol/L (ref 22–32)
Creatinine, Ser: 1.45 mg/dL — ABNORMAL HIGH (ref 0.44–1.00)
GFR calc Af Amer: 47 mL/min — ABNORMAL LOW (ref >60)
GFR calc non Af Amer: 41 mL/min — ABNORMAL LOW (ref >60)
GLUCOSE: 111 mg/dL — AB (ref 65–99)
POTASSIUM: 3.9 mmol/L (ref 3.5–5.1)
Sodium: 138 mmol/L (ref 135–145)
TOTAL PROTEIN: 7 g/dL (ref 6.5–8.1)

## 2016-03-23 LAB — TROPONIN I

## 2016-03-23 LAB — CBC WITH DIFFERENTIAL/PLATELET
BASOS ABS: 0.1 10*3/uL (ref 0–0.1)
BASOS PCT: 1 %
Eosinophils Absolute: 0.5 10*3/uL (ref 0–0.7)
Eosinophils Relative: 6 %
HEMATOCRIT: 47.1 % — AB (ref 35.0–47.0)
Hemoglobin: 15.9 g/dL (ref 12.0–16.0)
Lymphocytes Relative: 31 %
Lymphs Abs: 3 10*3/uL (ref 1.0–3.6)
MCH: 29.3 pg (ref 26.0–34.0)
MCHC: 33.8 g/dL (ref 32.0–36.0)
MCV: 86.8 fL (ref 80.0–100.0)
MONO ABS: 0.5 10*3/uL (ref 0.2–0.9)
Monocytes Relative: 5 %
NEUTROS ABS: 5.5 10*3/uL (ref 1.4–6.5)
Neutrophils Relative %: 57 %
PLATELETS: 306 10*3/uL (ref 150–440)
RBC: 5.43 MIL/uL — AB (ref 3.80–5.20)
RDW: 14.1 % (ref 11.5–14.5)
WBC: 9.5 10*3/uL (ref 3.6–11.0)

## 2016-03-23 MED ORDER — PREDNISONE 20 MG PO TABS: 60.0000 mg | ORAL_TABLET | Freq: Every day | ORAL | Status: DC

## 2016-03-23 MED ORDER — DEXTROSE 5 % IV SOLN
500.0000 mg | Freq: Once | INTRAVENOUS | Status: AC
  Administered 2016-03-23: 500 mg via INTRAVENOUS
  Filled 2016-03-23: qty 500

## 2016-03-23 MED ORDER — AZITHROMYCIN 250 MG PO TABS: ORAL_TABLET | ORAL | Status: AC

## 2016-03-23 MED ORDER — IPRATROPIUM-ALBUTEROL 0.5-2.5 (3) MG/3ML IN SOLN
RESPIRATORY_TRACT | Status: AC
  Filled 2016-03-23: qty 6

## 2016-03-23 MED ORDER — IPRATROPIUM-ALBUTEROL 0.5-2.5 (3) MG/3ML IN SOLN
RESPIRATORY_TRACT | Status: AC
  Filled 2016-03-23: qty 3

## 2016-03-23 MED ORDER — IPRATROPIUM-ALBUTEROL 0.5-2.5 (3) MG/3ML IN SOLN
3.0000 mL | Freq: Once | RESPIRATORY_TRACT | Status: AC
  Administered 2016-03-23: 3 mL via RESPIRATORY_TRACT

## 2016-03-23 MED ORDER — METHYLPREDNISOLONE SODIUM SUCC 125 MG IJ SOLR
INTRAMUSCULAR | Status: AC
  Administered 2016-03-23: 125 mg
  Filled 2016-03-23: qty 2

## 2016-03-23 MED ORDER — IPRATROPIUM-ALBUTEROL 0.5-2.5 (3) MG/3ML IN SOLN
3.0000 mL | Freq: Once | RESPIRATORY_TRACT | Status: AC
  Administered 2016-03-23: 6 mL via RESPIRATORY_TRACT

## 2016-03-23 MED ORDER — METHYLPREDNISOLONE SODIUM SUCC 125 MG IJ SOLR
125.0000 mg | Freq: Once | INTRAMUSCULAR | Status: AC
  Administered 2016-03-23: 125 mg via INTRAVENOUS

## 2016-03-23 MED ORDER — ALBUTEROL SULFATE HFA 108 (90 BASE) MCG/ACT IN AERS: 2.0000 | INHALATION_SPRAY | Freq: Four times a day (QID) | RESPIRATORY_TRACT | Status: DC | PRN

## 2016-03-23 NOTE — ED Provider Notes (Signed)
[facility] Emergency Department Provider Note  ____________________________________________  Time seen: Approximately 10:22 AM  I have reviewed the triage vital signs and the nursing notes.   HISTORY  Chief Complaint Shortness of Breath    HPI Samantha [name] is a 50 y.o. female withhistory of diabetes, hypertension, COPD with 2 L home oxygen requirement at night who presents for evaluation of worsening shortness of breath, wheezing, cough over the past 3 days, gradual onset, constant since onset, currently severe, only mildly improved with her breathing treatments at home. She has had cough productive of yellowish phlegm. No fevers. No chest pain. She reports that this feels similar to her usual COPD exacerbations. No vomiting or diarrhea.    Past Medical History  Diagnosis Date  . Asthma   . Hypertension   . Diabetes mellitus without complication (HCC)   . COPD (chronic obstructive pulmonary disease) [facility](HCC)     Patient Active Problem List   Diagnosis Date Noted  . Acute on chronic respiratory failure (HCC) 10/06/2015  . Acute on chronic respiratory failure with hypoxemia (HCC) 08/30/2015    Past Surgical History  Procedure Laterality Date  . None      Current Outpatient Rx  Name  Route  Sig  Dispense  Refill  . albuterol (PROVENTIL HFA;VENTOLIN HFA) 108 (90 Base) MCG/ACT inhaler   Inhalation   Inhale 2 puffs into the lungs every 6 (six) hours as needed for wheezing or shortness of breath.         . etodolac (LODINE) 200 MG capsule   Oral   Take 1 capsule (200 mg total) by mouth every 8 (eight) hours.   12 capsule   0   . gabapentin (NEURONTIN) 300 MG capsule   Oral   Take 300 mg by mouth 2 (two) times daily.      0   . losartan-hydrochlorothiazide (HYZAAR) 50-12.5 MG tablet   Oral   Take 1 tablet by mouth daily.      0   . metFORMIN (GLUCOPHAGE) 850 MG tablet   Oral   Take 1 tablet by mouth daily.      0   . montelukast  (SINGULAIR) 10 MG tablet   Oral   Take 1 tablet by mouth at bedtime.      0   . SYMBICORT 160-4.5 MCG/ACT inhaler   Inhalation   Inhale 2 puffs into the lungs every 12 (twelve) hours.   1 Inhaler   3     Dispense as written.   . tiotropium (SPIRIVA HANDIHALER) 18 MCG inhalation capsule   Inhalation   Place 1 capsule (18 mcg total) into inhaler and inhale every morning.   30 capsule   12   . albuterol (PROVENTIL HFA;VENTOLIN HFA) 108 (90 Base) MCG/ACT inhaler   Inhalation   Inhale 2 puffs into the lungs every 6 (six) hours as needed for wheezing or shortness of breath.   1 Inhaler   0   . azithromycin (ZITHROMAX Z-PAK) 250 MG tablet      Sig one tablet by mouth once daily for 4 days. Start taking this medication 03/24/2016.   4 each   0   . predniSONE (DELTASONE) 20 MG tablet   Oral   Take 3 tablets (60 mg total) by mouth daily.   15 tablet   0     Allergies Percocet  Family History  Problem Relation Age of Onset  . Asthma Mother   . Asthma Sister  Social History Social History  Substance Use Topics  . Smoking status: Current Every Day Smoker -- 1.00 packs/day for 23 years    Types: Cigarettes  . Smokeless tobacco: Never Used  . Alcohol Use: Yes    Review of Systems Constitutional: No fever/chills Eyes: No visual changes. ENT: No sore throat. Cardiovascular: Denies chest pain. Respiratory: + shortness of breath. Gastrointestinal: No abdominal pain.  No nausea, no vomiting.  No diarrhea.  No constipation. Genitourinary: Negative for dysuria. Musculoskeletal: Negative for back pain. Skin: Negative for rash. Neurological: Negative for headaches, focal weakness or numbness.  10-point ROS otherwise negative.  ____________________________________________   PHYSICAL EXAM:  Filed Vitals:   03/23/16 1100 03/23/16 1145 03/23/16 1200 03/23/16 1239  BP: 166/100  164/108 169/99  Pulse: 95  90 88  Temp:      TempSrc:      Resp: Height:      Weight:      SpO2: 95%  96% 94%     Constitutional: Alert and oriented. In moderate respiratory distress with increased work of breathing, tachypnea, able to speak in short sentences. Eyes: Conjunctivae are normal. PERRL. EOMI. Head: Atraumatic. Nose: No congestion/rhinnorhea. Mouth/Throat: Mucous membranes are moist.  Oropharynx non-erythematous. Neck: No stridor.  Supple without meningismus. Cardiovascular: Normal rate, regular rhythm. Grossly normal heart sounds.  Good peripheral circulation. Respiratory: Tachypnea with increased work of breathing, diffuse expiratory wheeze with poor air movement. Gastrointestinal: Soft and nontender. No distention.  No CVA tenderness. Genitourinary: deferred Musculoskeletal: No lower extremity tenderness nor edema.  No joint effusions. Neurologic:  Normal speech and language. No gross focal neurologic deficits are appreciated.  Skin:  Skin is warm, dry and intact. No rash noted. Psychiatric: Mood and affect are normal. Speech and behavior are normal.  ____________________________________________   LABS (all labs ordered are listed, but only abnormal results are displayed)  Labs Reviewed  CBC WITH DIFFERENTIAL/PLATELET - Abnormal; Notable for the following:    RBC 5.43 (*)    HCT 47.1 (*)    All other components within normal limits  COMPREHENSIVE METABOLIC PANEL - Abnormal; Notable for the following:    Glucose, Bld 111 (*)    Creatinine, Ser 1.45 (*)    GFR calc non Af Amer 41 (*)    GFR calc Af Amer 47 (*)    All other components within normal limits  CULTURE, BLOOD (ROUTINE X 2)  CULTURE, BLOOD (ROUTINE X 2)  TROPONIN I   ____________________________________________  EKG  ED ECG REPORT I, Gayla Doss, the attending physician, personally viewed and interpreted this ECG.   Date: 03/23/2016  EKG Time: 10:26  Rate: 94  Rhythm: normal EKG, normal sinus rhythm  Axis: normal  Intervals:none  ST&T Change: No  acute ST elevation.  ____________________________________________  RADIOLOGY  CXR  IMPRESSION: Mild hyperinflation consistent with COPD. There is no evidence of pneumonia, CHF, nor other acute cardiopulmonary abnormality.   ____________________________________________   PROCEDURES  Procedure(s) performed: None  Critical Care performed: No  ____________________________________________   INITIAL IMPRESSION / ASSESSMENT AND PLAN / ED COURSE  Pertinent labs & imaging results that were available during my care of the patient were reviewed by me and considered in my medical decision making (see chart for details).  Samantha [name] is a 50 y.o. female withhistory of diabetes, hypertension, COPD with 2 L home oxygen requirement at night who presents for evaluation of worsening shortness of breath, wheezing, cough. On exam she is in  moderate respiratory distress with tachypnea and increased work of breathing as well as wheezing with poor air movement consistent with COPD exacerbation. Vital signs are notable for tachypnea but otherwise they are stable, she is afebrile. Will Treat symptomatically with DuoNeb, steroids, azithromycin. We'll obtain screening labs, EKG, chest x-ray. Reassess for disposition.  ----------------------------------------- 12:34 PM on 03/23/2016 ----------------------------------------- Patient with significant symptomatic improvement at this time. She has improvement of air movement and reduction of wheezing. She reports that she feels well and is requesting discharge. Labs reviewed. Negative troponin. CMP with chronic creatinine elevation at 1.45. CBC unremarkable. Chest x-ray shows COPD. We discussed return precautions, need for close PCP follow-up and she is comfortable with the discharge plan. DC home with azithromycin and prednisone. ____________________________________________   FINAL CLINICAL IMPRESSION(S) / ED DIAGNOSES  Final diagnoses:  COPD  exacerbation (HCC)      Gayla Doss, MD 03/23/16 1626

## 2016-03-23 NOTE — ED Notes (Signed)
Arrives with compalints of SOB, hx of COPD and smoker, states she has had SOB and a yellow productive cough for 3 days, arrives tight and wheezing, diaphoretic

## 2016-03-23 NOTE — ED Notes (Signed)
Pt presents with shortness of breath and cough for a couple of days. Tried to treat at home with no relief.

## 2016-03-28 LAB — CULTURE, BLOOD (ROUTINE X 2)
CULTURE: NO GROWTH
Culture: NO GROWTH

## 2016-04-08 ENCOUNTER — Emergency Department: Payer: Medicare Other

## 2016-04-08 ENCOUNTER — Emergency Department
Admission: EM | Admit: 2016-04-08 | Discharge: 2016-04-08 | Disposition: A | Discharge status: Home / Self Care | Payer: Medicare Other | Attending: Student | Admitting: Student

## 2016-04-08 DIAGNOSIS — Z79899 Other long term (current) drug therapy: Secondary | ICD-10-CM | POA: Diagnosis not present

## 2016-04-08 DIAGNOSIS — J45909 Unspecified asthma, uncomplicated: Secondary | ICD-10-CM | POA: Diagnosis not present

## 2016-04-08 DIAGNOSIS — I10 Essential (primary) hypertension: Secondary | ICD-10-CM | POA: Diagnosis not present

## 2016-04-08 DIAGNOSIS — F17210 Nicotine dependence, cigarettes, uncomplicated: Secondary | ICD-10-CM | POA: Diagnosis not present

## 2016-04-08 DIAGNOSIS — E119 Type 2 diabetes mellitus without complications: Secondary | ICD-10-CM | POA: Diagnosis not present

## 2016-04-08 DIAGNOSIS — R0602 Shortness of breath: Secondary | ICD-10-CM | POA: Diagnosis present

## 2016-04-08 DIAGNOSIS — Z7984 Long term (current) use of oral hypoglycemic drugs: Secondary | ICD-10-CM | POA: Diagnosis not present

## 2016-04-08 DIAGNOSIS — J441 Chronic obstructive pulmonary disease with (acute) exacerbation: Secondary | ICD-10-CM | POA: Diagnosis not present

## 2016-04-08 DIAGNOSIS — J449 Chronic obstructive pulmonary disease, unspecified: Secondary | ICD-10-CM | POA: Diagnosis not present

## 2016-04-08 LAB — CBC WITH DIFFERENTIAL/PLATELET
Basophils Absolute: 0.1 10*3/uL (ref 0–0.1)
Basophils Relative: 1 %
Eosinophils Absolute: 0.5 10*3/uL (ref 0–0.7)
Eosinophils Relative: 6 %
HCT: 47.8 % — ABNORMAL HIGH (ref 35.0–47.0)
HEMOGLOBIN: 16.1 g/dL — AB (ref 12.0–16.0)
LYMPHS ABS: 2.7 10*3/uL (ref 1.0–3.6)
Lymphocytes Relative: 31 %
MCH: 29.1 pg (ref 26.0–34.0)
MCHC: 33.6 g/dL (ref 32.0–36.0)
MCV: 86.6 fL (ref 80.0–100.0)
MONOS PCT: 6 %
Monocytes Absolute: 0.6 10*3/uL (ref 0.2–0.9)
NEUTROS ABS: 5 10*3/uL (ref 1.4–6.5)
NEUTROS PCT: 56 %
Platelets: 312 10*3/uL (ref 150–440)
RBC: 5.52 MIL/uL — AB (ref 3.80–5.20)
RDW: 13.8 % (ref 11.5–14.5)
WBC: 8.9 10*3/uL (ref 3.6–11.0)

## 2016-04-08 LAB — COMPREHENSIVE METABOLIC PANEL
ALK PHOS: 78 U/L (ref 38–126)
ALT: 44 U/L (ref 14–54)
AST: 26 U/L (ref 15–41)
Albumin: 4.2 g/dL (ref 3.5–5.0)
Anion gap: 7 (ref 5–15)
BUN: 23 mg/dL — ABNORMAL HIGH (ref 6–20)
CALCIUM: 9.8 mg/dL (ref 8.9–10.3)
CO2: 25 mmol/L (ref 22–32)
CREATININE: 1.66 mg/dL — AB (ref 0.44–1.00)
Chloride: 107 mmol/L (ref 101–111)
GFR, EST AFRICAN AMERICAN: 40 mL/min — AB (ref >60)
GFR, EST NON AFRICAN AMERICAN: 35 mL/min — AB (ref >60)
Glucose, Bld: 174 mg/dL — ABNORMAL HIGH (ref 65–99)
Potassium: 4.1 mmol/L (ref 3.5–5.1)
Sodium: 139 mmol/L (ref 135–145)
TOTAL PROTEIN: 7.5 g/dL (ref 6.5–8.1)
Total Bilirubin: 0.7 mg/dL (ref 0.3–1.2)

## 2016-04-08 LAB — TROPONIN I

## 2016-04-08 MED ORDER — ALBUTEROL SULFATE HFA 108 (90 BASE) MCG/ACT IN AERS: 2.0000 | INHALATION_SPRAY | Freq: Four times a day (QID) | RESPIRATORY_TRACT | Status: DC | PRN

## 2016-04-08 MED ORDER — PREDNISONE 20 MG PO TABS: 60.0000 mg | ORAL_TABLET | Freq: Every day | ORAL | Status: DC

## 2016-04-08 MED ORDER — LEVOFLOXACIN 750 MG PO TABS: 750.0000 mg | ORAL_TABLET | Freq: Every day | ORAL | Status: DC

## 2016-04-08 MED ORDER — IPRATROPIUM-ALBUTEROL 0.5-2.5 (3) MG/3ML IN SOLN
3.0000 mL | Freq: Once | RESPIRATORY_TRACT | Status: AC
  Administered 2016-04-08: 3 mL via RESPIRATORY_TRACT

## 2016-04-08 MED ORDER — METHYLPREDNISOLONE SODIUM SUCC 125 MG IJ SOLR
125.0000 mg | Freq: Once | INTRAMUSCULAR | Status: AC
  Administered 2016-04-08: 125 mg via INTRAVENOUS
  Filled 2016-04-08: qty 2

## 2016-04-08 MED ORDER — IPRATROPIUM-ALBUTEROL 0.5-2.5 (3) MG/3ML IN SOLN
RESPIRATORY_TRACT | Status: AC
  Filled 2016-04-08: qty 9

## 2016-04-08 NOTE — ED Provider Notes (Signed)
[facility] Emergency Department Provider Note  ____________________________________________  Time seen: Approximately 8:27 AM  I have reviewed the triage vital signs and the nursing notes.   HISTORY  Chief Complaint Shortness of Breath    HPI Samantha [name] is a 50 y.o. female with history of diabetes, hypertension, COPD with 2 L home oxygen requirement at night who presents for evaluation of worsening shortness of breath, wheezing, cough over the past 3 days, gradual onset, constant since onset, currently severe, not improved with her home nebulization treatments her rescue inhalers. No chest pain, no fevers. No abdominal pain, vomiting, diarrhea, chills. I saw her in this emergency department on 03/23/2016 for COPD exacerbation for which she reports that initially she was being better after azithromycin and steroids however over the last 3 days, her symptoms have returned and worsened.    Past Medical History  Diagnosis Date  . Asthma   . Hypertension   . Diabetes mellitus without complication (HCC)   . COPD (chronic obstructive pulmonary disease) [facility](HCC)     Patient Active Problem List   Diagnosis Date Noted  . Acute on chronic respiratory failure (HCC) 10/06/2015  . Acute on chronic respiratory failure with hypoxemia (HCC) 08/30/2015    Past Surgical History  Procedure Laterality Date  . None      Current Outpatient Rx  Name  Route  Sig  Dispense  Refill  . albuterol (PROVENTIL HFA;VENTOLIN HFA) 108 (90 Base) MCG/ACT inhaler   Inhalation   Inhale 2 puffs into the lungs every 6 (six) hours as needed for wheezing or shortness of breath.         Marland Kitchen. albuterol (PROVENTIL HFA;VENTOLIN HFA) 108 (90 Base) MCG/ACT inhaler   Inhalation   Inhale 2 puffs into the lungs every 6 (six) hours as needed for wheezing or shortness of breath.   1 Inhaler   0   . albuterol (PROVENTIL HFA;VENTOLIN HFA) 108 (90 Base) MCG/ACT inhaler   Inhalation   Inhale 2  puffs into the lungs every 6 (six) hours as needed for wheezing or shortness of breath.   1 Inhaler   0   . etodolac (LODINE) 200 MG capsule   Oral   Take 1 capsule (200 mg total) by mouth every 8 (eight) hours.   12 capsule   0   . gabapentin (NEURONTIN) 300 MG capsule   Oral   Take 300 mg by mouth 2 (two) times daily.      0   . levofloxacin (LEVAQUIN) 750 MG tablet   Oral   Take 1 tablet (750 mg total) by mouth daily.   5 tablet   0   . losartan-hydrochlorothiazide (HYZAAR) 50-12.5 MG tablet   Oral   Take 1 tablet by mouth daily.      0   . metFORMIN (GLUCOPHAGE) 850 MG tablet   Oral   Take 1 tablet by mouth daily.      0   . montelukast (SINGULAIR) 10 MG tablet   Oral   Take 1 tablet by mouth at bedtime.      0   . predniSONE (DELTASONE) 20 MG tablet   Oral   Take 3 tablets (60 mg total) by mouth daily.   15 tablet   0   . predniSONE (DELTASONE) 20 MG tablet   Oral   Take 3 tablets (60 mg total) by mouth daily. Fill today, start tomorrow 04/09/16.   12 tablet   0   . SYMBICORT 160-4.5  MCG/ACT inhaler   Inhalation   Inhale 2 puffs into the lungs every 12 (twelve) hours.   1 Inhaler   3     Dispense as written.   . tiotropium (SPIRIVA HANDIHALER) 18 MCG inhalation capsule   Inhalation   Place 1 capsule (18 mcg total) into inhaler and inhale every morning.   30 capsule   12     Allergies Percocet  Family History  Problem Relation Age of Onset  . Asthma Mother   . Asthma Sister     Social History Social History  Substance Use Topics  . Smoking status: Current Every Day Smoker -- 1.00 packs/day for 23 years    Types: Cigarettes  . Smokeless tobacco: Never Used  . Alcohol Use: Yes    Review of Systems Constitutional: No fever/chills Eyes: No visual changes. ENT: No sore throat. Cardiovascular: Denies chest pain. Respiratory: + shortness of breath. Gastrointestinal: No abdominal pain.  No nausea, no vomiting.  No diarrhea.  No  constipation. Genitourinary: Negative for dysuria. Musculoskeletal: Negative for back pain. Skin: Negative for rash. Neurological: Negative for headaches, focal weakness or numbness.  10-point ROS otherwise negative.  ____________________________________________   PHYSICAL EXAM:  Filed Vitals:   04/08/16 0831 04/08/16 0911 04/08/16 1011 04/08/16 1021  BP: 169/107 149/91 158/97   Pulse: 101 101 98   Temp: 97.2 F (36.2 C)     TempSrc: Axillary     Resp: Weight: 267 lb (121.11 kg)     SpO2: 93% 94% 93% 95%      Constitutional: Alert and oriented. In moderate respiratory distress but able to speak in short sentences. Eyes: Conjunctivae are normal. PERRL. EOMI. Head: Atraumatic. Nose: No congestion/rhinnorhea. Mouth/Throat: Mucous membranes are moist.  Oropharynx non-erythematous. Neck: No stridor. Supple without meningismus. Cardiovascular: Normal rate, regular rhythm. Grossly normal heart sounds.  Good peripheral circulation. Respiratory: Tachypnea, increased work of breathing, expiratory wheeze with poor air movement throughout all lung fields. Gastrointestinal: Soft and nontender. No distention. No CVA tenderness. Genitourinary: deferred Musculoskeletal: No lower extremity tenderness nor edema.  No joint effusions. No calf Swelling/tenderness/asymmetry. Neurologic:  Normal speech and language. No gross focal neurologic deficits are appreciated. No gait instability. Skin:  Skin is warm, dry and intact. No rash noted. Psychiatric: Mood and affect are normal. Speech and behavior are normal.  ____________________________________________   LABS (all labs ordered are listed, but only abnormal results are displayed)  Labs Reviewed  CBC WITH DIFFERENTIAL/PLATELET - Abnormal; Notable for the following:    RBC 5.52 (*)    Hemoglobin 16.1 (*)    HCT 47.8 (*)    All other components within normal limits  COMPREHENSIVE METABOLIC PANEL - Abnormal; Notable for the  following:    Glucose, Bld 174 (*)    BUN 23 (*)    Creatinine, Ser 1.66 (*)    GFR calc non Af Amer 35 (*)    GFR calc Af Amer 40 (*)    All other components within normal limits  CULTURE, BLOOD (ROUTINE X 2)  CULTURE, BLOOD (ROUTINE X 2)  TROPONIN I   ____________________________________________  EKG  ED ECG REPORT I, Gayla Doss, the attending physician, personally viewed and interpreted this ECG.   Date: 04/08/2016  EKG Time: 08:40  Rate: 103  Rhythm: sinus tachycardia  Axis: normal  Intervals:none  ST&T Change: No acute ST elevation.  ____________________________________________  RADIOLOGY  CXR IMPRESSION: No active disease. ____________________________________________   PROCEDURES  Procedure(s) performed: None  Critical Care performed: No  ____________________________________________   INITIAL IMPRESSION / ASSESSMENT AND PLAN / ED COURSE  Pertinent labs & imaging results that were available during my care of the patient were reviewed by me and considered in my medical decision making (see chart for details).  Stephanine A Blanchette is a 50 y.o. female with history of diabetes, hypertension, COPD with 2 L home oxygen requirement at night who presents for evaluation of worsening shortness of breath, wheezing, cough over the past 3 days. On exam, she is in moderate respiratory distress with wheezing and poor air movement consistent with COPD exacerbation. She is mildly tachycardic, mildly tachypneic but the remainder for vital signs are stable, she is afebrile. Other screening labs, we'll give DuoNeb treatments, steroids, reassess for disposition.  ----------------------------------------- 10:07 AM on 04/08/2016 ----------------------------------------- Labs reviewed. Hemoglobin is mildly elevated at 16.1, suspect is secondary to hemoconcentration. We'll encourage by mouth fluid intake. Negative troponin. Creatinine also mildly elevated at 1.66 this appears to  be near the patient's baseline. Chest x-ray shows no acute cardiopulmonary process. Patient reports she feels much better after 3 DuoNeb treatments and Solu-Medrol. She is requesting discharge. She does appear much more comfortable, her work of breathing has improved, she has improved air movement and diminished wheeze. I have offered admission if she feels poorly however she has refused stating that she feels well and wants to go home. She has previously scheduled follow-up with her primary care doctor in 3 days. Discussed return precautions, need for close PCP follow-up and she is comfortable with the discharge plan. DC home. Her heart rate at the time of discharge is 98 bpm, O2 sat is 97%. She is chronically hypertensive but not symptomatic, she'll follow-up with her primary care doctor for this.  ____________________________________________   FINAL CLINICAL IMPRESSION(S) / ED DIAGNOSES  Final diagnoses:  COPD with exacerbation (HCC)      Gayla Doss, MD 04/08/16 [phone]

## 2016-04-08 NOTE — ED Notes (Signed)
Patient states she feels better after the duoneb treatment. Patient is still labored, talking on the phone and can speak in complete, short sentences.

## 2016-04-08 NOTE — ED Notes (Signed)
Per EMS report, Patient has had several neb treatments over the last 24 hours to treat shortness of breath without relief. Patient walked from ambulance bay to room. Patient had labored breathing, pulse ox was initially 85%, but did rise to 92% quickly on room air when patient was at rest.

## 2016-04-13 LAB — CULTURE, BLOOD (ROUTINE X 2)
CULTURE: NO GROWTH
Culture: NO GROWTH

## 2016-04-19 ENCOUNTER — Encounter: Payer: Self-pay | Admitting: *Deleted

## 2016-04-19 DIAGNOSIS — F17210 Nicotine dependence, cigarettes, uncomplicated: Secondary | ICD-10-CM | POA: Diagnosis not present

## 2016-04-19 DIAGNOSIS — J441 Chronic obstructive pulmonary disease with (acute) exacerbation: Secondary | ICD-10-CM | POA: Diagnosis not present

## 2016-04-19 DIAGNOSIS — E1165 Type 2 diabetes mellitus with hyperglycemia: Secondary | ICD-10-CM | POA: Diagnosis not present

## 2016-04-19 DIAGNOSIS — J301 Allergic rhinitis due to pollen: Secondary | ICD-10-CM | POA: Diagnosis not present

## 2016-04-19 DIAGNOSIS — I10 Essential (primary) hypertension: Secondary | ICD-10-CM | POA: Diagnosis not present

## 2016-04-25 ENCOUNTER — Ambulatory Visit: Payer: Self-pay | Admitting: General Surgery

## 2016-05-26 ENCOUNTER — Encounter: Payer: Self-pay | Admitting: *Deleted

## 2016-06-22 IMAGING — CR DG CHEST 1V PORT
1 series · 1 of 1 positions shown · non-contrast
Comparison: 12/07/2014; 10/23/2014; 06/16/2014

CLINICAL DATA: Chest tightness and shortness of breath. History of
COPD.

EXAM:
PORTABLE CHEST - 1 VIEW

[ap]
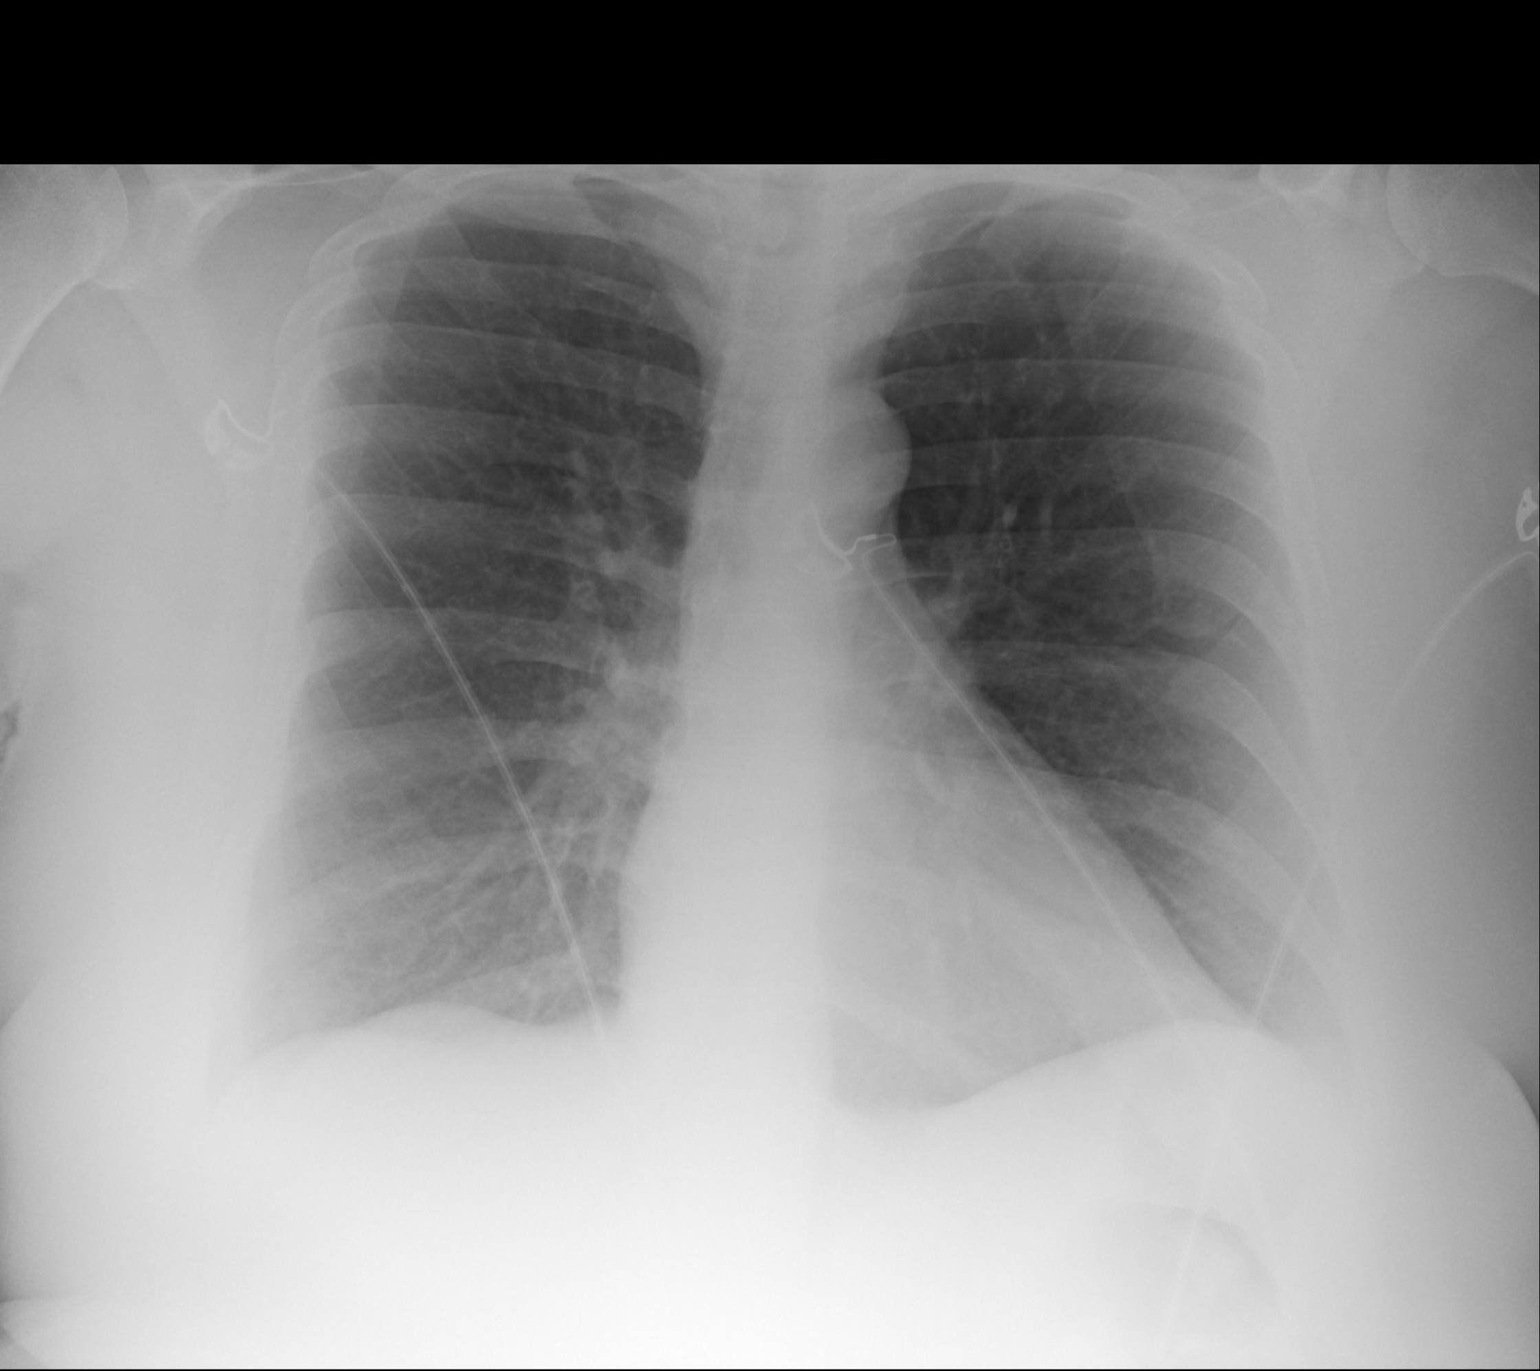

[1 of 1 positions shown; findings below may reference images not displayed]

FINDINGS: Examination is degraded due to patient body habitus and portable
technique.

Grossly unchanged enlarged cardiac silhouette. Normal mediastinal
contours. Veiling opacities overlying the bilateral lower lungs are
favored to represent overlying breast tissues. No discrete focal
airspace opacities. There is mild elevation / eventration of the mid
aspect of the bilateral hemidiaphragms. No definite pleural effusion
or pneumothorax. No evidence of edema. No acute osseus
abnormalities.
IMPRESSION: No acute cardiopulmonary disease on this AP portable examination.
Further evaluation with a PA and lateral chest radiograph may be
obtained as clinically indicated.

## 2016-07-09 IMAGING — CR DG CHEST 2V
1 series · 2 of 2 positions shown · non-contrast
Comparison: None.

CLINICAL DATA: Audible wheezing, nonproductive cough for 2 days.
History of COPD, hypertension and asthma.

EXAM:
CHEST  2 VIEW

[Series 1: dxr chest pa (or ap) and lateral · 0.14mm/px · 2 of 2 slices shown]
[im 1/2]
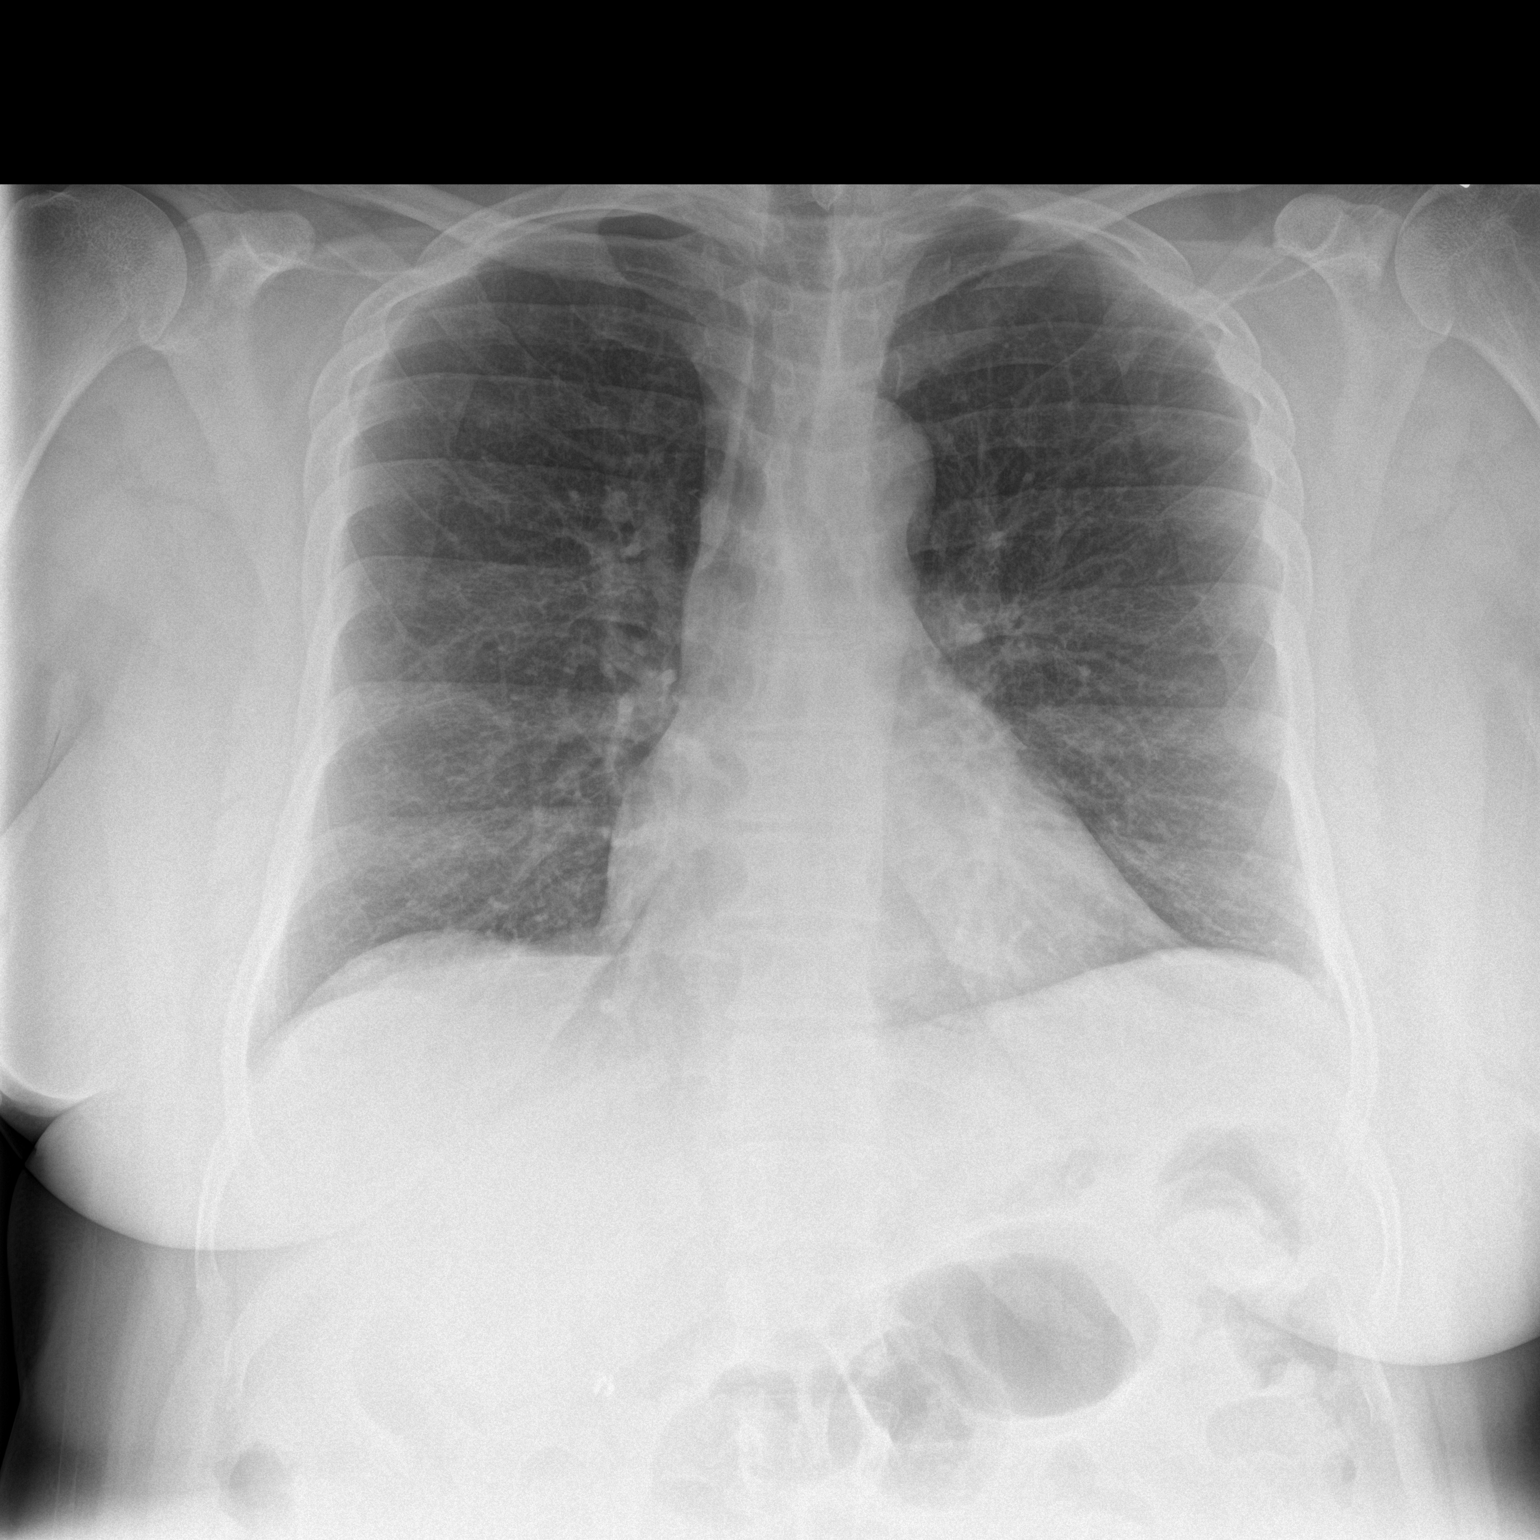
[im 2/2]
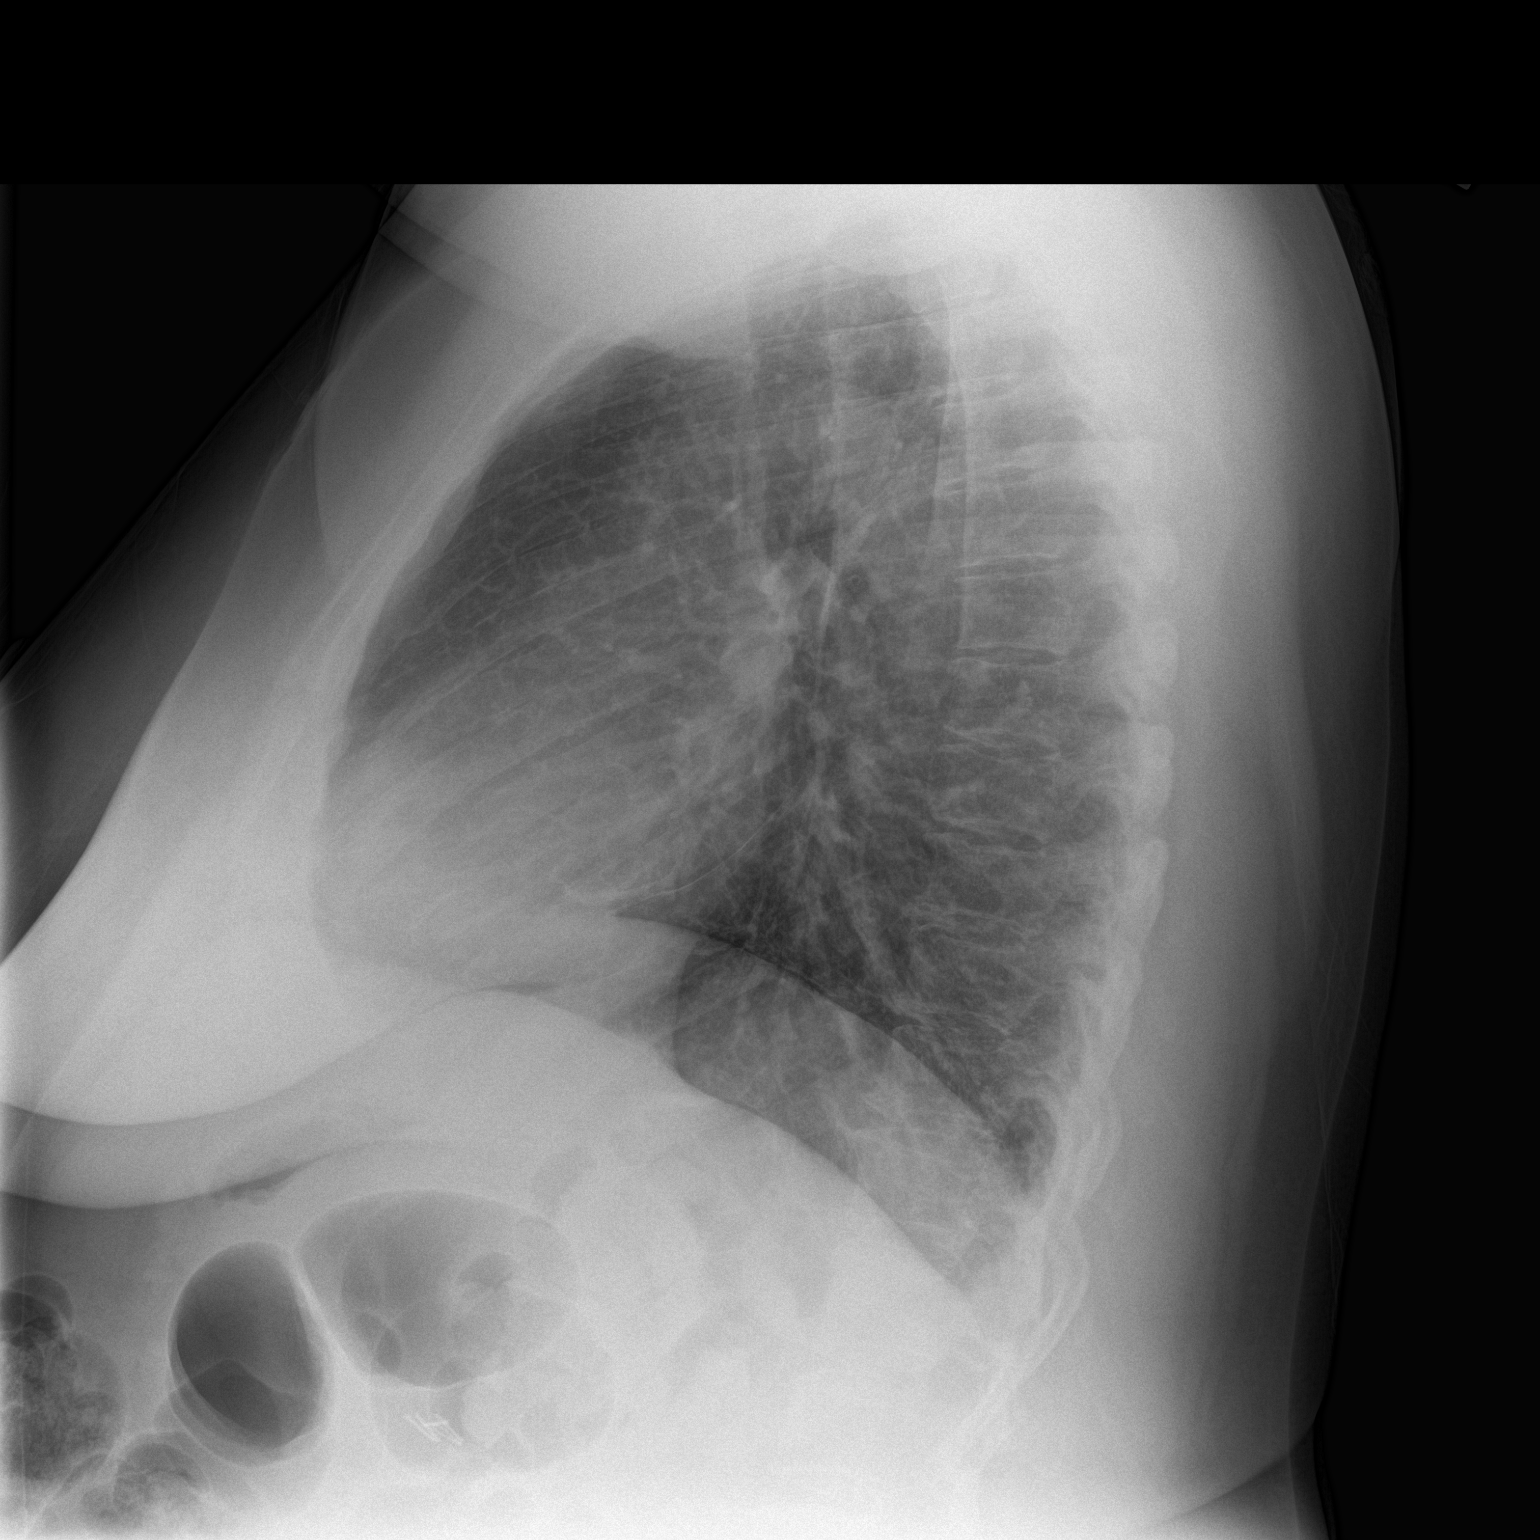

[2 of 2 positions shown; findings below may reference images not displayed]

FINDINGS: Cardiomediastinal silhouette is unremarkable. Mild bronchitic
changes. The lungs are clear without pleural effusions or focal
consolidations. Trachea projects midline and there is no
pneumothorax. Soft tissue planes and included osseous structures are
non-suspicious. Surgical clips in the included right abdomen likely
reflect cholecystectomy.
IMPRESSION: Mild bronchitic changes without focal consolidation.

  By: Klutsey Chainz

## 2016-08-19 ENCOUNTER — Emergency Department
Admission: EM | Admit: 2016-08-19 | Discharge: 2016-08-19 | Disposition: A | Discharge status: Home / Self Care | Payer: Medicare Other | Attending: Emergency Medicine | Admitting: Emergency Medicine

## 2016-08-19 ENCOUNTER — Encounter: Payer: Self-pay | Admitting: Emergency Medicine

## 2016-08-19 ENCOUNTER — Emergency Department: Payer: Medicare Other

## 2016-08-19 DIAGNOSIS — J45909 Unspecified asthma, uncomplicated: Secondary | ICD-10-CM | POA: Insufficient documentation

## 2016-08-19 DIAGNOSIS — Z79899 Other long term (current) drug therapy: Secondary | ICD-10-CM | POA: Diagnosis not present

## 2016-08-19 DIAGNOSIS — E119 Type 2 diabetes mellitus without complications: Secondary | ICD-10-CM | POA: Insufficient documentation

## 2016-08-19 DIAGNOSIS — R000 Tachycardia, unspecified: Secondary | ICD-10-CM | POA: Diagnosis not present

## 2016-08-19 DIAGNOSIS — I10 Essential (primary) hypertension: Secondary | ICD-10-CM | POA: Diagnosis not present

## 2016-08-19 DIAGNOSIS — F17210 Nicotine dependence, cigarettes, uncomplicated: Secondary | ICD-10-CM | POA: Diagnosis not present

## 2016-08-19 DIAGNOSIS — Z7984 Long term (current) use of oral hypoglycemic drugs: Secondary | ICD-10-CM | POA: Insufficient documentation

## 2016-08-19 DIAGNOSIS — J441 Chronic obstructive pulmonary disease with (acute) exacerbation: Secondary | ICD-10-CM | POA: Diagnosis not present

## 2016-08-19 DIAGNOSIS — R0602 Shortness of breath: Secondary | ICD-10-CM | POA: Diagnosis not present

## 2016-08-19 DIAGNOSIS — R079 Chest pain, unspecified: Secondary | ICD-10-CM | POA: Diagnosis not present

## 2016-08-19 DIAGNOSIS — R05 Cough: Secondary | ICD-10-CM | POA: Diagnosis not present

## 2016-08-19 LAB — CBC WITH DIFFERENTIAL/PLATELET
Basophils Absolute: 0.1 K/uL (ref 0–0.1)
Basophils Relative: 1 %
Eosinophils Absolute: 0.6 K/uL (ref 0–0.7)
Eosinophils Relative: 6 %
HCT: 46.6 % (ref 35.0–47.0)
Hemoglobin: 15.7 g/dL (ref 12.0–16.0)
Lymphocytes Relative: 29 %
Lymphs Abs: 2.9 K/uL (ref 1.0–3.6)
MCH: 29.2 pg (ref 26.0–34.0)
MCHC: 33.8 g/dL (ref 32.0–36.0)
MCV: 86.5 fL (ref 80.0–100.0)
Monocytes Absolute: 0.6 K/uL (ref 0.2–0.9)
Monocytes Relative: 6 %
Neutro Abs: 5.8 K/uL (ref 1.4–6.5)
Neutrophils Relative %: 58 %
Platelets: 319 K/uL (ref 150–440)
RBC: 5.39 MIL/uL — ABNORMAL HIGH (ref 3.80–5.20)
RDW: 14.5 % (ref 11.5–14.5)
WBC: 10 K/uL (ref 3.6–11.0)

## 2016-08-19 LAB — COMPREHENSIVE METABOLIC PANEL WITH GFR
ALT: 54 U/L (ref 14–54)
AST: 33 U/L (ref 15–41)
Albumin: 4.1 g/dL (ref 3.5–5.0)
Alkaline Phosphatase: 86 U/L (ref 38–126)
Anion gap: 7 (ref 5–15)
BUN: 17 mg/dL (ref 6–20)
CO2: 23 mmol/L (ref 22–32)
Calcium: 9.4 mg/dL (ref 8.9–10.3)
Chloride: 108 mmol/L (ref 101–111)
Creatinine, Ser: 1.34 mg/dL — ABNORMAL HIGH (ref 0.44–1.00)
GFR calc Af Amer: 52 mL/min — ABNORMAL LOW
GFR calc non Af Amer: 45 mL/min — ABNORMAL LOW
Glucose, Bld: 145 mg/dL — ABNORMAL HIGH (ref 65–99)
Potassium: 4.2 mmol/L (ref 3.5–5.1)
Sodium: 138 mmol/L (ref 135–145)
Total Bilirubin: 0.5 mg/dL (ref 0.3–1.2)
Total Protein: 7.5 g/dL (ref 6.5–8.1)

## 2016-08-19 LAB — TROPONIN I: Troponin I: <0.03 ng/mL

## 2016-08-19 MED ORDER — PREDNISONE 50 MG PO TABS: 50.0000 mg | ORAL_TABLET | Freq: Every day | ORAL | 0 refills | Status: DC

## 2016-08-19 MED ORDER — IPRATROPIUM-ALBUTEROL 0.5-2.5 (3) MG/3ML IN SOLN
3.0000 mL | Freq: Once | RESPIRATORY_TRACT | Status: AC
  Administered 2016-08-19: 3 mL via RESPIRATORY_TRACT
  Filled 2016-08-19: qty 3

## 2016-08-19 NOTE — ED Provider Notes (Signed)
[facility] Emergency Department Provider Note   ____________________________________________    I have reviewed the triage vital signs and the nursing notes.   HISTORY  Chief Complaint Shortness of Breath     HPI Samantha [name] is a 50 y.o. female who presents with complaints of shortness of breath. Patient reports that she began feeling short of breath yesterday and it was much worse this morning. She attributed this to her COPD. She reports she feels better already after receiving steroids via EMS. She denies chest pain or pleurisy. No leg swelling. No recent travel. Distant history of intubation, she reports occasionally she requires admission for this but feels pretty good today.   Past Medical History:  Diagnosis Date  . Asthma   . COPD (chronic obstructive pulmonary disease) (HCC)   . Diabetes mellitus without complication (HCC)   . Hypertension     Patient Active Problem List   Diagnosis Date Noted  . Acute on chronic respiratory failure (HCC) 10/06/2015  . Acute on chronic respiratory failure with hypoxemia (HCC) 08/30/2015    Past Surgical History:  Procedure Laterality Date  . none      Prior to Admission medications   Medication Sig Start Date End Date Taking? Authorizing Provider  albuterol (PROVENTIL HFA;VENTOLIN HFA) 108 (90 Base) MCG/ACT inhaler Inhale 2 puffs into the lungs every 6 (six) hours as needed for wheezing or shortness of breath.    Historical Provider, MD  albuterol (PROVENTIL HFA;VENTOLIN HFA) 108 (90 Base) MCG/ACT inhaler Inhale 2 puffs into the lungs every 6 (six) hours as needed for wheezing or shortness of breath. 03/23/16   Gayla Doss, MD  albuterol (PROVENTIL HFA;VENTOLIN HFA) 108 (90 Base) MCG/ACT inhaler Inhale 2 puffs into the lungs every 6 (six) hours as needed for wheezing or shortness of breath. 04/08/16   Gayla Doss, MD  etodolac (LODINE) 200 MG capsule Take 1 capsule (200 mg total) by mouth  every 8 (eight) hours. 01/05/16   Rebecka Apley, MD  gabapentin (NEURONTIN) 300 MG capsule Take 300 mg by mouth 2 (two) times daily.    Historical Provider, MD  levofloxacin (LEVAQUIN) 750 MG tablet Take 1 tablet (750 mg total) by mouth daily. 04/08/16   Gayla Doss, MD  losartan-hydrochlorothiazide (HYZAAR) 50-12.5 MG tablet Take 1 tablet by mouth daily. 02/22/16   Historical Provider, MD  metFORMIN (GLUCOPHAGE) 850 MG tablet Take 1 tablet by mouth daily. 12/11/15   Historical Provider, MD  montelukast (SINGULAIR) 10 MG tablet Take 1 tablet by mouth at bedtime.    Historical Provider, MD  predniSONE (DELTASONE) 20 MG tablet Take 3 tablets (60 mg total) by mouth daily. 03/23/16   Gayla Doss, MD  predniSONE (DELTASONE) 20 MG tablet Take 3 tablets (60 mg total) by mouth daily. Fill today, start tomorrow 04/09/16. 04/08/16   Gayla Doss, MD  SYMBICORT 160-4.5 MCG/ACT inhaler Inhale 2 puffs into the lungs every 12 (twelve) hours. 08/31/15   Enedina Finner, MD  tiotropium (SPIRIVA HANDIHALER) 18 MCG inhalation capsule Place 1 capsule (18 mcg total) into inhaler and inhale every morning. 10/08/15   Gale Journey, MD     Allergies Percocet [oxycodone-acetaminophen]  Family History  Problem Relation Age of Onset  . Asthma Mother   . Asthma Sister     Social History Social History  Substance Use Topics  . Smoking status: Current Every Day Smoker    Packs/day: 1.00    Years: 23.00  Types: Cigarettes  . Smokeless tobacco: Never Used  . Alcohol use Yes    Review of Systems  Constitutional: No fever/chills  ENT: No Throat swelling Cardiovascular: Denies chest pain. Respiratory: As above. Gastrointestinal: No abdominal pain.  No nausea, no vomiting.   Genitourinary: Negative for dysuria. Musculoskeletal: Negative for back pain. Skin: Negative for rash. Neurological: Negative for headaches  10-point ROS otherwise  negative.  ____________________________________________   PHYSICAL EXAM:  VITAL SIGNS: ED Triage Vitals  Enc Vitals Group     BP 08/19/16 0916 (!) 153/104     Pulse Rate 08/19/16 0916 100     Resp 08/19/16 0916 (!) 22     Temp 08/19/16 0916 98.8 F (37.1 C)     Temp Source 08/19/16 0916 Oral     SpO2 08/19/16 0916 97 %     Weight 08/19/16 0917 267 lb (121.1 kg)     Height 08/19/16 0917 5\' 7"  (1.702 m)     Head Circumference --      Peak Flow --      Pain Score --      Pain Loc --      Pain Edu? --      Excl. in GC? --     Constitutional: Alert and oriented. No acute distress. Pleasant and interactive Eyes: Conjunctivae are normal.  Head: Atraumatic. Nose: No congestion/rhinnorhea. Mouth/Throat: Mucous membranes are moist.   Neck:  Painless ROM Cardiovascular: Normal rate, regular rhythm. Grossly normal heart sounds.  Good peripheral circulation. Respiratory: Mild tachypnea and increased respiratory effort, diffuse wheezing.   Gastrointestinal: Soft and nontender. No distention.  No CVA tenderness. Genitourinary: deferred Musculoskeletal: No lower extremity tenderness nor edema.  Warm and well perfused Neurologic:  Normal speech and language. No gross focal neurologic deficits are appreciated.  Skin:  Skin is warm, dry and intact. No rash noted. Psychiatric: Mood and affect are normal. Speech and behavior are normal.  ____________________________________________   LABS (all labs ordered are listed, but only abnormal results are displayed)  Labs Reviewed - No data to display ____________________________________________  EKG  ED ECG REPORT I, Jene EveryKINNER, Georgana Romain, the attending physician, personally viewed and interpreted this ECG.  Date: 08/19/2016 EKG Time: 9:23 AM Rate: 100 Rhythm: Sinus tachycardia QRS Axis: normal Intervals: normal ST/T Wave abnormalities: normal Conduction Disturbances: none Narrative Interpretation:  unremarkable  ____________________________________________  RADIOLOGY  Chest x-ray unremarkable ____________________________________________   PROCEDURES  Procedure(s) performed: No    Critical Care performed: No ____________________________________________   INITIAL IMPRESSION / ASSESSMENT AND PLAN / ED COURSE  Pertinent labs & imaging results that were available during my care of the patient were reviewed by me and considered in my medical decision making (see chart for details).  Patient presents with shortness of breath consistent with COPD exacerbation. We will obtain labs x-ray and EKG and give DuoNeb's, she has already received Solu-Medrol  Clinical Course  Patient felt significant better after 2 DuoNeb's. Vital signs had normalized. She is anxious to go home. She agrees to return if any worsening of her symptoms. She reports she has a nebulizer machine at home ____________________________________________   FINAL CLINICAL IMPRESSION(S) / ED DIAGNOSES  Final diagnoses:  COPD exacerbation (HCC)      NEW MEDICATIONS STARTED DURING THIS VISIT:  New Prescriptions   No medications on file     Note:  This document was prepared using Dragon voice recognition software and may include unintentional dictation errors.    Jene Everyobert Sharnay Cashion, MD 08/19/16 [phone]

## 2016-08-19 NOTE — ED Triage Notes (Signed)
Pt arrived via EMS from home for reports of increasing shortness of breath. EMS administered duoneb x1, albuterol 2.5 mg and solumedrol 125 mg. EMS reports 151/100, 96% RA.

## 2016-08-27 ENCOUNTER — Emergency Department
Admission: EM | Admit: 2016-08-27 | Discharge: 2016-08-27 | Disposition: A | Discharge status: Home / Self Care | Payer: Medicare Other | Attending: Emergency Medicine | Admitting: Emergency Medicine

## 2016-08-27 ENCOUNTER — Encounter: Payer: Self-pay | Admitting: Emergency Medicine

## 2016-08-27 ENCOUNTER — Emergency Department: Payer: Medicare Other

## 2016-08-27 DIAGNOSIS — Z79899 Other long term (current) drug therapy: Secondary | ICD-10-CM | POA: Diagnosis not present

## 2016-08-27 DIAGNOSIS — I10 Essential (primary) hypertension: Secondary | ICD-10-CM | POA: Diagnosis not present

## 2016-08-27 DIAGNOSIS — Z7984 Long term (current) use of oral hypoglycemic drugs: Secondary | ICD-10-CM | POA: Diagnosis not present

## 2016-08-27 DIAGNOSIS — J45909 Unspecified asthma, uncomplicated: Secondary | ICD-10-CM | POA: Insufficient documentation

## 2016-08-27 DIAGNOSIS — F17210 Nicotine dependence, cigarettes, uncomplicated: Secondary | ICD-10-CM | POA: Insufficient documentation

## 2016-08-27 DIAGNOSIS — E119 Type 2 diabetes mellitus without complications: Secondary | ICD-10-CM | POA: Diagnosis not present

## 2016-08-27 DIAGNOSIS — J441 Chronic obstructive pulmonary disease with (acute) exacerbation: Secondary | ICD-10-CM | POA: Diagnosis not present

## 2016-08-27 DIAGNOSIS — R0602 Shortness of breath: Secondary | ICD-10-CM | POA: Diagnosis present

## 2016-08-27 DIAGNOSIS — R0600 Dyspnea, unspecified: Secondary | ICD-10-CM | POA: Diagnosis not present

## 2016-08-27 LAB — BASIC METABOLIC PANEL
Anion gap: 9 (ref 5–15)
BUN: 21 mg/dL — AB (ref 6–20)
CALCIUM: 9.1 mg/dL (ref 8.9–10.3)
CO2: 24 mmol/L (ref 22–32)
CREATININE: 1.29 mg/dL — AB (ref 0.44–1.00)
Chloride: 104 mmol/L (ref 101–111)
GFR calc non Af Amer: 47 mL/min — ABNORMAL LOW (ref >60)
GFR, EST AFRICAN AMERICAN: 55 mL/min — AB (ref >60)
GLUCOSE: 135 mg/dL — AB (ref 65–99)
Potassium: 3.9 mmol/L (ref 3.5–5.1)
Sodium: 137 mmol/L (ref 135–145)

## 2016-08-27 LAB — CBC
HCT: 46.5 % (ref 35.0–47.0)
Hemoglobin: 15.8 g/dL (ref 12.0–16.0)
MCH: 29.3 pg (ref 26.0–34.0)
MCHC: 33.9 g/dL (ref 32.0–36.0)
MCV: 86.4 fL (ref 80.0–100.0)
PLATELETS: 342 10*3/uL (ref 150–440)
RBC: 5.39 MIL/uL — AB (ref 3.80–5.20)
RDW: 13.9 % (ref 11.5–14.5)
WBC: 11.7 10*3/uL — ABNORMAL HIGH (ref 3.6–11.0)

## 2016-08-27 LAB — TROPONIN I

## 2016-08-27 MED ORDER — IPRATROPIUM-ALBUTEROL 0.5-2.5 (3) MG/3ML IN SOLN
3.0000 mL | Freq: Once | RESPIRATORY_TRACT | Status: AC
  Administered 2016-08-27: 3 mL via RESPIRATORY_TRACT
  Filled 2016-08-27: qty 3

## 2016-08-27 MED ORDER — PREDNISONE 10 MG PO TABS: 10.0000 mg | ORAL_TABLET | Freq: Every day | ORAL | 0 refills | Status: DC

## 2016-08-27 MED ORDER — AZITHROMYCIN 250 MG PO TABS: ORAL_TABLET | ORAL | 0 refills | Status: AC

## 2016-08-27 MED ORDER — METHYLPREDNISOLONE SODIUM SUCC 125 MG IJ SOLR
125.0000 mg | Freq: Once | INTRAMUSCULAR | Status: AC
  Administered 2016-08-27: 125 mg via INTRAVENOUS
  Filled 2016-08-27: qty 2

## 2016-08-27 MED ORDER — IPRATROPIUM-ALBUTEROL 0.5-2.5 (3) MG/3ML IN SOLN
RESPIRATORY_TRACT | Status: AC
  Administered 2016-08-27: 3 mL via RESPIRATORY_TRACT
  Filled 2016-08-27: qty 3

## 2016-08-27 NOTE — Discharge Instructions (Signed)
Please take your steroids and antibiotics for their entire course as prescribed. Please follow-up with your primary care doctor on Monday for reevaluation. Return to the emergency department for any worsening trouble breathing, development of any chest pain, or any other symptom personally concerning to your self.

## 2016-08-27 NOTE — ED Provider Notes (Signed)
[facility] Emergency Department Provider Note  Time seen: 2:01 PM  I have reviewed the triage vital signs and the nursing notes.   HISTORY  Chief Complaint Shortness of Breath    HPI Samantha [name] is a 50 y.o. female with a past medical history of COPD, asthma, diabetes, who presents to the emergency department with shortness of breath. According to the patient for the past 10 days she has been feeling short of breath. She was seen in the emergency department approximately 8 days ago, at that time she was started on 4 days of steroids and discharged home. Patient states she was feeling much better and improving with the steroids however once the steroids stop she felt like she got worse once again with return of wheeze. She states cough with yellow sputum production. Denies any chest pain. Denies any fever. Describes her shortness of breath is moderate, worse with exertion.  Past Medical History:  Diagnosis Date  . Asthma   . COPD (chronic obstructive pulmonary disease) (HCC)   . Diabetes mellitus without complication (HCC)   . Hypertension     Patient Active Problem List   Diagnosis Date Noted  . Acute on chronic respiratory failure (HCC) 10/06/2015  . Acute on chronic respiratory failure with hypoxemia (HCC) 08/30/2015    Past Surgical History:  Procedure Laterality Date  . none      Prior to Admission medications   Medication Sig Start Date End Date Taking? Authorizing Provider  albuterol (PROVENTIL HFA;VENTOLIN HFA) 108 (90 Base) MCG/ACT inhaler Inhale 2 puffs into the lungs every 6 (six) hours as needed for wheezing or shortness of breath. 04/08/16   Gayla DossEryka A Gayle, MD  budesonide-formoterol (SYMBICORT) 160-4.5 MCG/ACT inhaler Inhale 2 puffs into the lungs every 12 (twelve) hours.    Historical Provider, MD  gabapentin (NEURONTIN) 300 MG capsule Take 300 mg by mouth 2 (two) times daily.    Historical Provider, MD  glimepiride (AMARYL) 4 MG tablet  Take 4 mg by mouth daily.    Historical Provider, MD  levofloxacin (LEVAQUIN) 750 MG tablet Take 1 tablet (750 mg total) by mouth daily. Patient not taking: Reported on 08/19/2016 04/08/16   Gayla DossEryka A Gayle, MD  losartan-hydrochlorothiazide (HYZAAR) 50-12.5 MG tablet Take 1 tablet by mouth daily. 02/22/16   Historical Provider, MD  metFORMIN (GLUCOPHAGE) 850 MG tablet Take 1 tablet by mouth daily. 12/11/15   Historical Provider, MD  montelukast (SINGULAIR) 10 MG tablet Take 10 mg by mouth at bedtime.     Historical Provider, MD  predniSONE (DELTASONE) 50 MG tablet Take 1 tablet (50 mg total) by mouth daily with breakfast. 08/19/16   Jene Everyobert Kinner, MD  tiotropium (SPIRIVA HANDIHALER) 18 MCG inhalation capsule Place 1 capsule (18 mcg total) into inhaler and inhale every morning. 10/08/15   Gale Journeyatherine P Walsh, MD    Allergies  Allergen Reactions  . Percocet [Oxycodone-Acetaminophen] Itching    Family History  Problem Relation Age of Onset  . Asthma Mother   . Asthma Sister     Social History Social History  Substance Use Topics  . Smoking status: Current Every Day Smoker    Packs/day: 1.00    Years: 23.00    Types: Cigarettes  . Smokeless tobacco: Never Used  . Alcohol use Yes    Review of Systems Constitutional: Negative for fever. Cardiovascular: Negative for chest pain. Respiratory: Moderate shortness of breath. Gastrointestinal: Negative for abdominal pain Musculoskeletal: Negative for back pain. Neurological: Negative for headache 10-point  ROS otherwise negative.  ____________________________________________   PHYSICAL EXAM:  VITAL SIGNS: ED Triage Vitals  Enc Vitals Group     BP 08/27/16 1350 (!) 147/89     Pulse Rate 08/27/16 1350 (!) 105     Resp 08/27/16 1350 (!) 22     Temp 08/27/16 1350 98.7 F (37.1 C)     Temp Source 08/27/16 1350 Oral     SpO2 08/27/16 1350 97 %     Weight 08/27/16 1351 267 lb (121.1 kg)     Height 08/27/16 1351 5\' 7"  (1.702 m)     Head  Circumference --      Peak Flow --      Pain Score 08/27/16 1351 10     Pain Loc --      Pain Edu? --      Excl. in GC? --     Constitutional: Alert and oriented. Well appearing and in no distress. Eyes: Normal exam ENT   Head: Normocephalic and atraumatic   Mouth/Throat: Mucous membranes are moist. Cardiovascular: Normal rate, regular rhythm. No murmur Respiratory: Mild tachypnea, moderate expiratory wheeze bilaterally. No obvious rales or rhonchi. Gastrointestinal: Soft and nontender. No distention.  Musculoskeletal: Nontender with normal range of motion in all extremities. No lower extremity tenderness  Neurologic:  Normal speech and language. No gross focal neurologic deficits  Skin:  Skin is warm, dry and intact.  Psychiatric: Mood and affect are normal.   ____________________________________________    EKG  EKG reviewed and interpreted, so shows sinus tachycardia at 112 bpm, narrow QRS, normal axis, normal intervals, no concerning ST changes.  ____________________________________________    RADIOLOGY  X-ray negative  ____________________________________________   INITIAL IMPRESSION / ASSESSMENT AND PLAN / ED COURSE  Pertinent labs & imaging results that were available during my care of the patient were reviewed by me and considered in my medical decision making (see chart for details).  The patient presents to the emergency department with shortness of breath. Patient has a history of COPD and asthma. Moderate expiratory wheeze on exam bilaterally. Patient states she was improving with steroids however once the steroids were finished she once again worsened. Now she is having cough productive of yellow sputum. We will check labs, obtain a chest x-ray, and closely monitor in the emergency department. We will treat with DuoNeb and IV Solu-Medrol. His lungs the patient's labs and chest x-ray are within normal limits and plan to discharge the patient home with a  longer taper of steroids, cover with antibiotics and have her follow up with a primary care doctor on Monday. I discussed this plan with the patient, she is agreeable.  ____________________________________________   FINAL CLINICAL IMPRESSION(S) / ED DIAGNOSES  COPD exacerbation    Minna Antis, MD 08/29/16 1517

## 2016-08-27 NOTE — ED Triage Notes (Signed)
Pt presents to the ED via EMS from home with c/o shortness of breath that started 2 days ago to worsen. Pt was here 8 days ago and treated but states she has not felt any better and was given 4 days of steroids. Pt has had a total of 7 neb treatments at home and 1 with EMS.  Pt is alert and oriented.

## 2016-09-09 DIAGNOSIS — J301 Allergic rhinitis due to pollen: Secondary | ICD-10-CM | POA: Diagnosis not present

## 2016-09-09 DIAGNOSIS — F17210 Nicotine dependence, cigarettes, uncomplicated: Secondary | ICD-10-CM | POA: Diagnosis not present

## 2016-09-09 DIAGNOSIS — I10 Essential (primary) hypertension: Secondary | ICD-10-CM | POA: Diagnosis not present

## 2016-09-09 DIAGNOSIS — J441 Chronic obstructive pulmonary disease with (acute) exacerbation: Secondary | ICD-10-CM | POA: Diagnosis not present

## 2016-09-09 DIAGNOSIS — J069 Acute upper respiratory infection, unspecified: Secondary | ICD-10-CM | POA: Diagnosis not present

## 2016-09-09 DIAGNOSIS — E1165 Type 2 diabetes mellitus with hyperglycemia: Secondary | ICD-10-CM | POA: Diagnosis not present

## 2016-10-01 IMAGING — CR DG CHEST 1V PORT
1 series · 1 of 1 positions shown · non-contrast
Comparison: 03/10/2015

CLINICAL DATA: Shortness of breath and wheezing

EXAM:
PORTABLE CHEST - 1 VIEW

[ap]
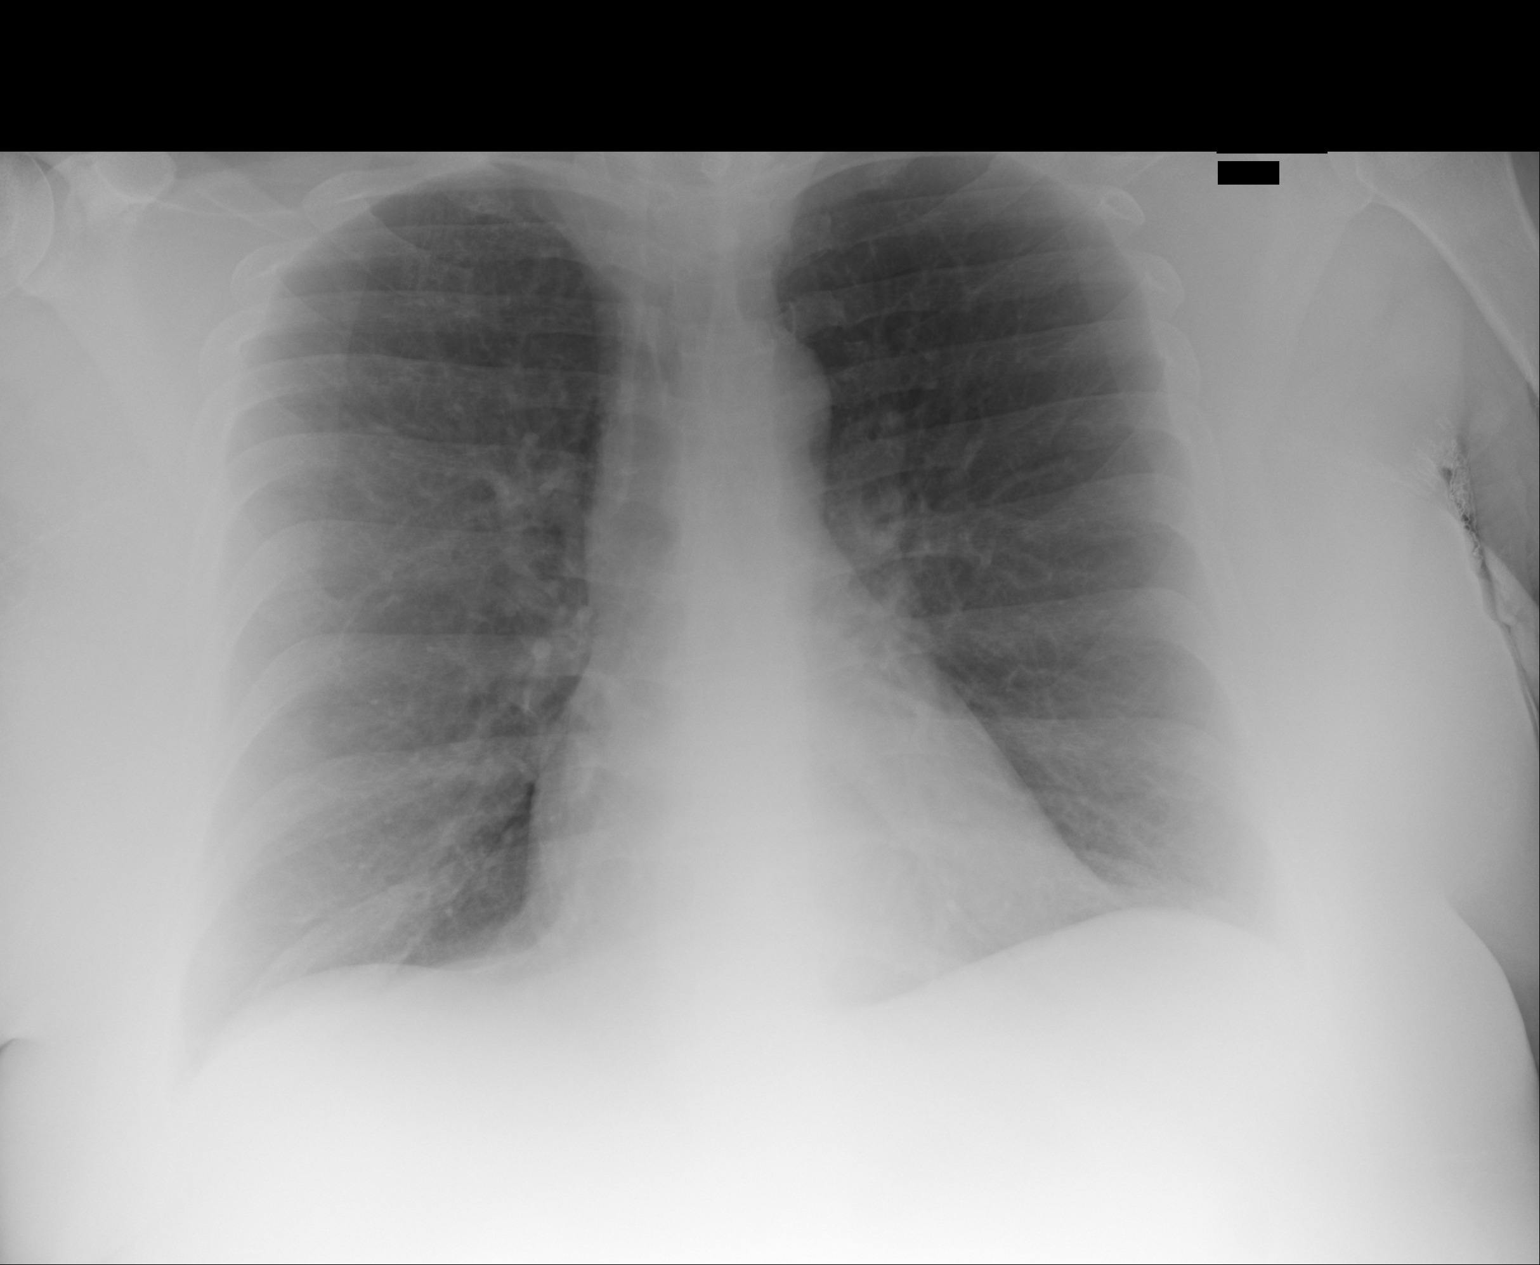

[1 of 1 positions shown; findings below may reference images not displayed]

FINDINGS: The heart size and mediastinal contours are within normal limits.
Both lungs are clear. The visualized skeletal structures are
unremarkable.
IMPRESSION: No active disease.

## 2016-10-29 IMAGING — CR DG CHEST 1V PORT
1 series · 1 of 1 positions shown · non-contrast
Comparison: June 16, 2015

CLINICAL DATA: Two day history of wheezing and shortness of breath

EXAM:
PORTABLE CHEST - 1 VIEW

[ap]
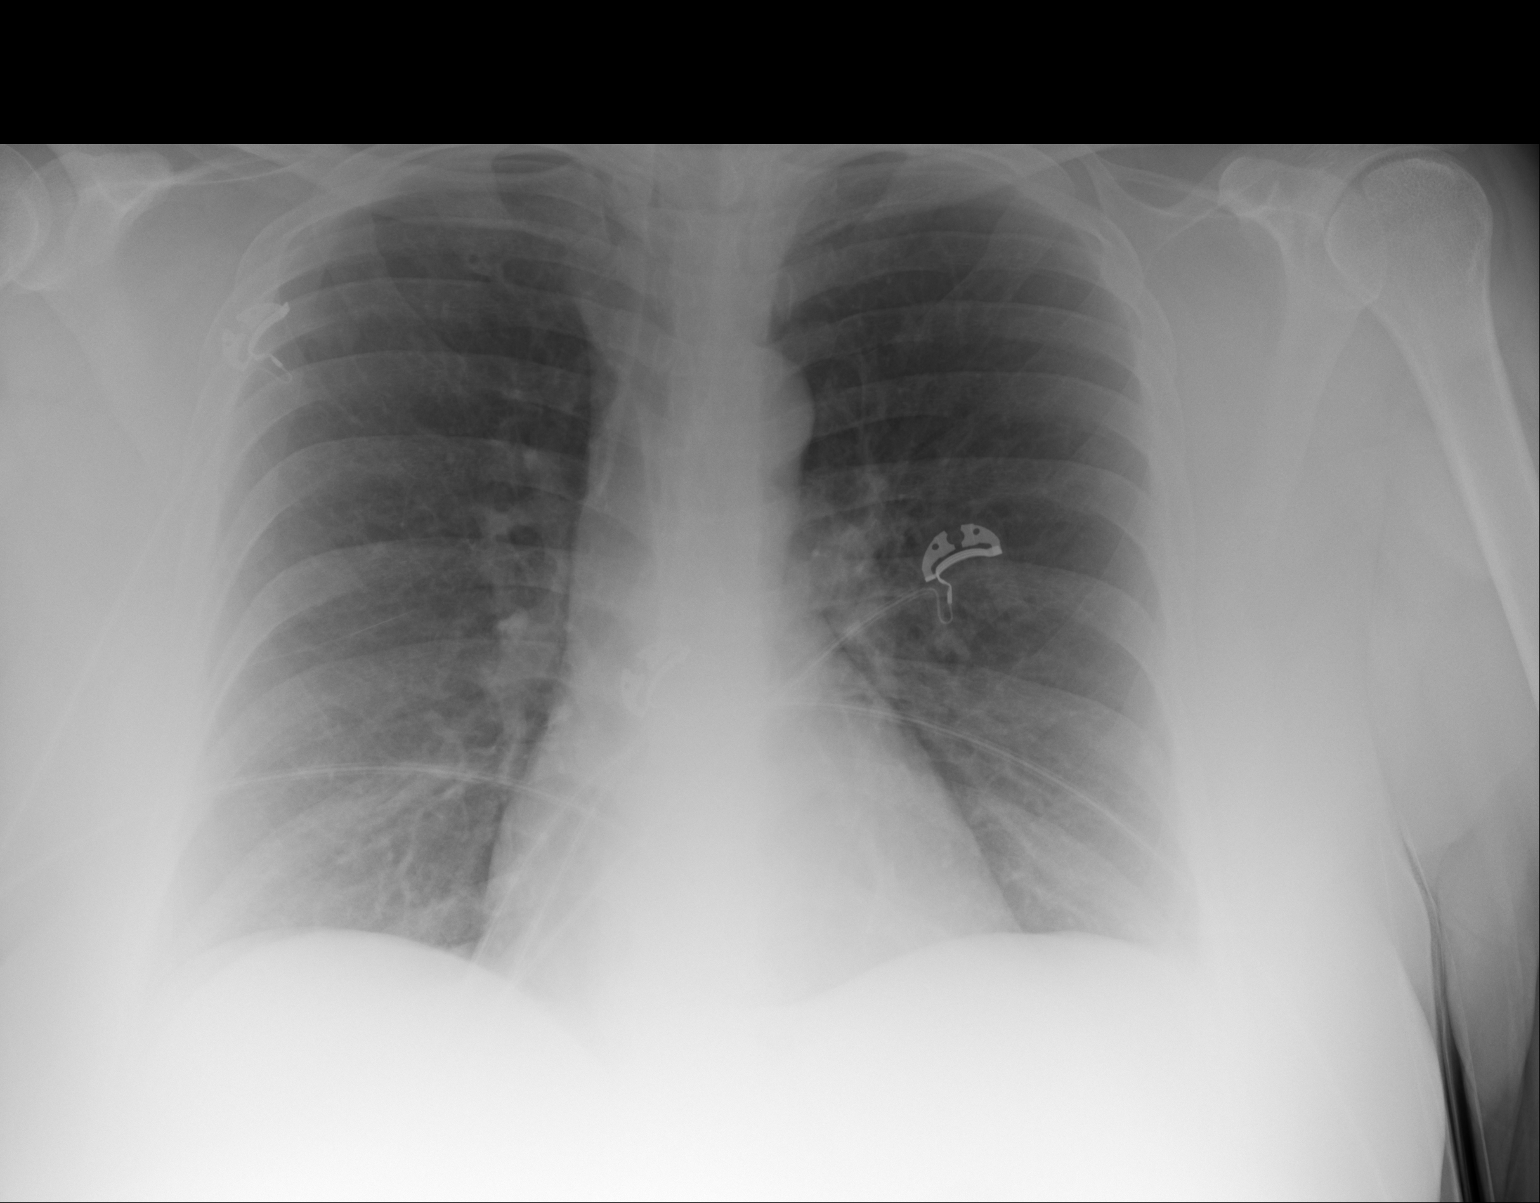

[1 of 1 positions shown; findings below may reference images not displayed]

FINDINGS: There is no edema or consolidation. The heart size and pulmonary
vascularity are normal. No adenopathy. No pneumothorax. No bone
lesions.
IMPRESSION: No abnormality noted.

## 2016-11-29 IMAGING — CR DG CHEST 1V PORT
1 series · 1 of 1 positions shown · non-contrast
Comparison: One-view chest 06/30/2015.

CLINICAL DATA: Wheezing and shortness of breath for 3 days. Asthma
and COPD.

EXAM:
PORTABLE CHEST - 1 VIEW

[ap]
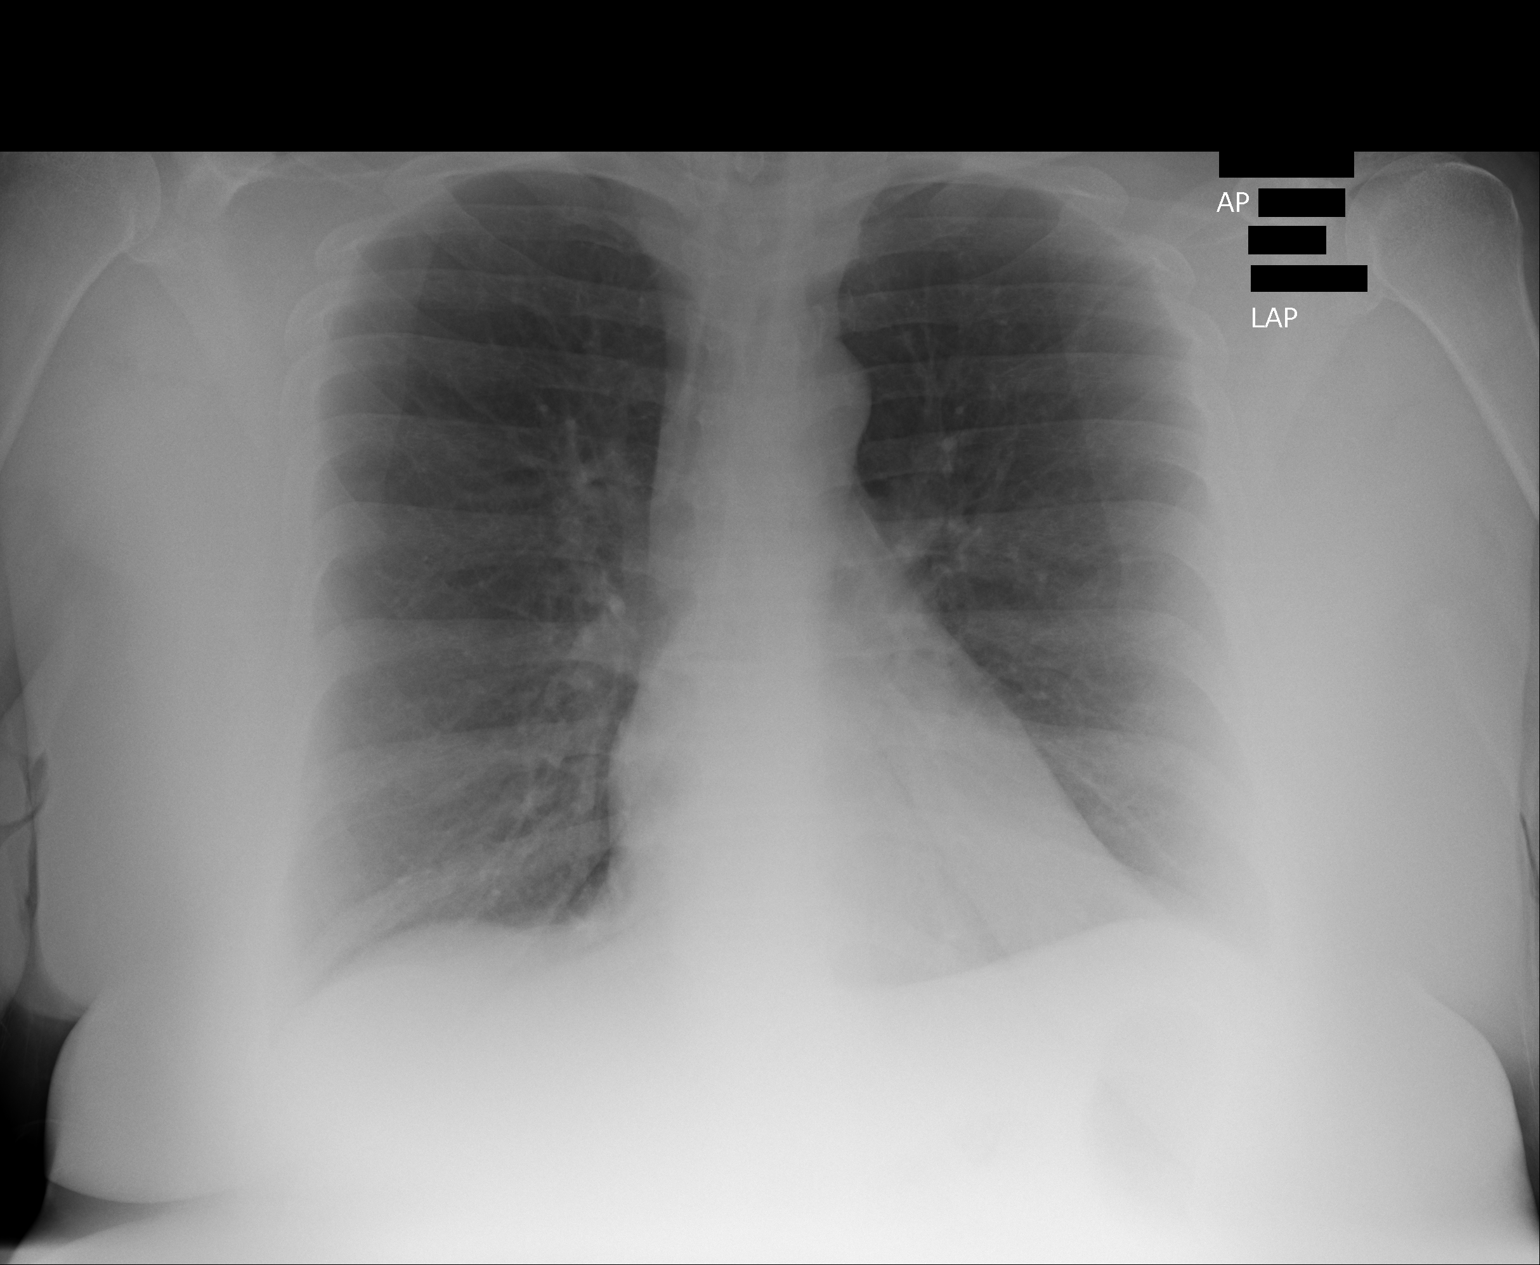

[1 of 1 positions shown; findings below may reference images not displayed]

FINDINGS: The heart size is normal. Lungs are clear. Emphysematous changes are
again noted. There is no edema or effusion to suggest failure. No
focal airspace disease is present. The visualized soft tissues and
bony thorax are unremarkable.
IMPRESSION: 1. No acute cardiopulmonary disease or significant interval change.

## 2016-12-11 IMAGING — US US PELVIS COMPLETE
1 series · 14 of 25 positions shown · non-contrast
Comparison: None

CLINICAL DATA: Right lower quadrant abdominal pain and vaginal
discharge for the past week.

EXAM:
TRANSABDOMINAL AND TRANSVAGINAL ULTRASOUND OF PELVIS
TECHNIQUE: Both transabdominal and transvaginal ultrasound examinations of the
pelvis were performed. Transabdominal technique was performed for
global imaging of the pelvis including uterus, ovaries, adnexal
regions, and pelvic cul-de-sac. It was necessary to proceed with
endovaginal exam following the transabdominal exam to visualize the
uterus and ovaries in better detail.

[Series 1: us pelvis complete · 0.24mm/px · 14 of 69 slices shown]
[im 1/69]
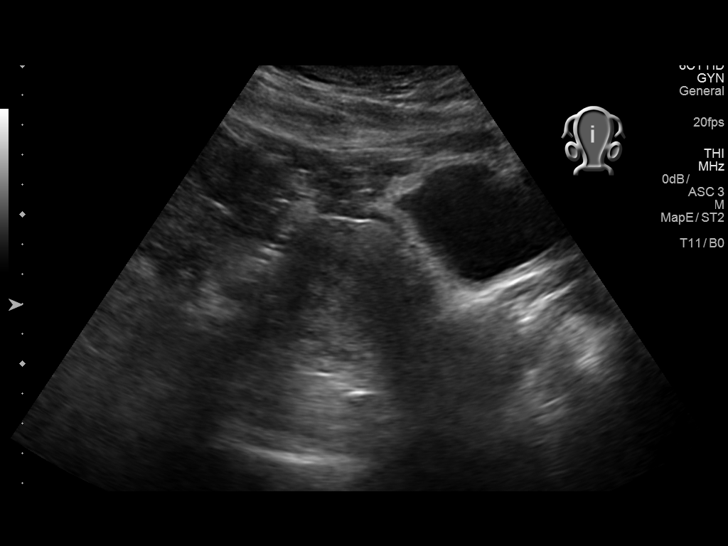
[im 6/69]
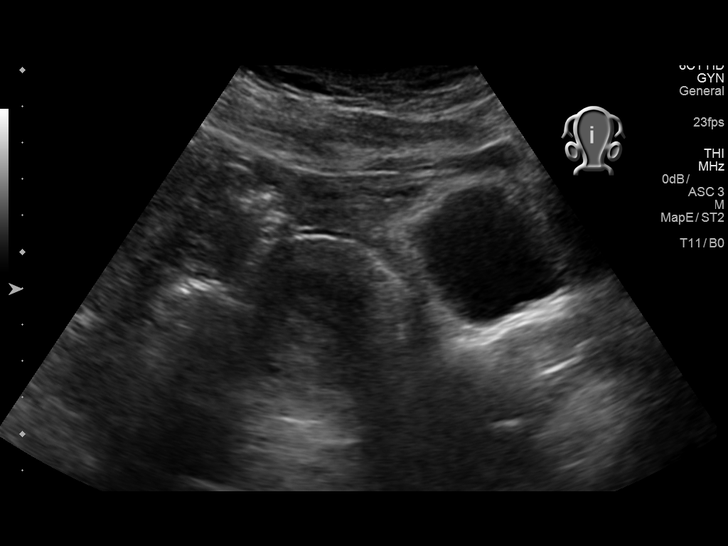
[im 12/69]
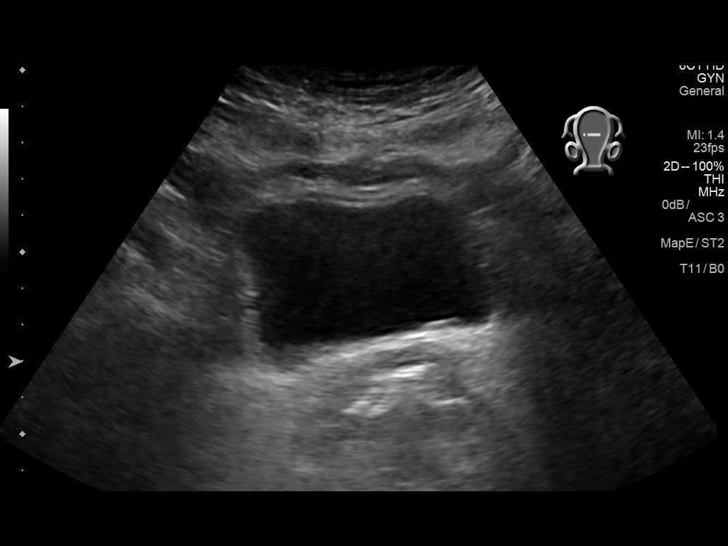
[im 18/69]
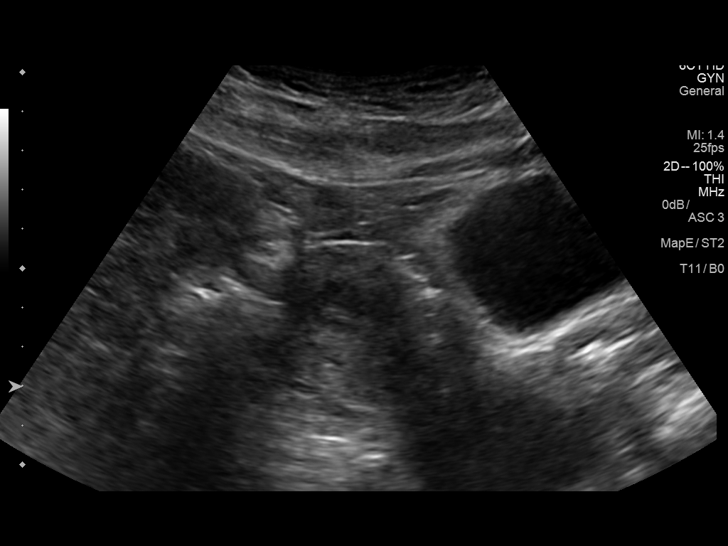
[im 23/69]
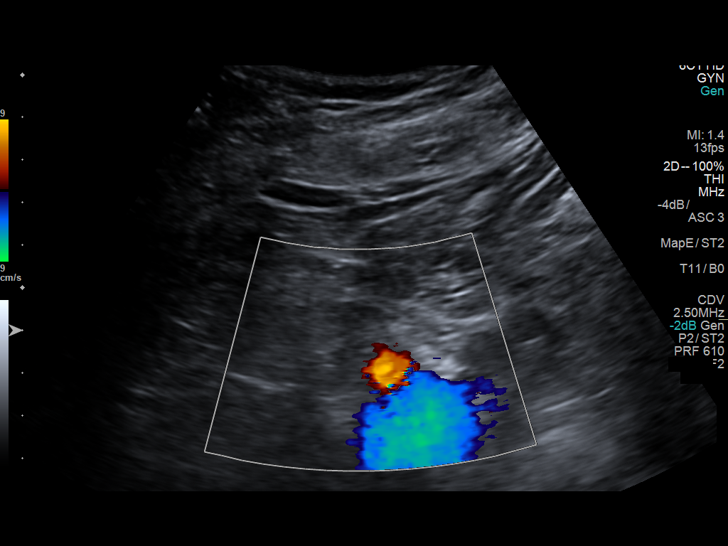
[im 26/69]
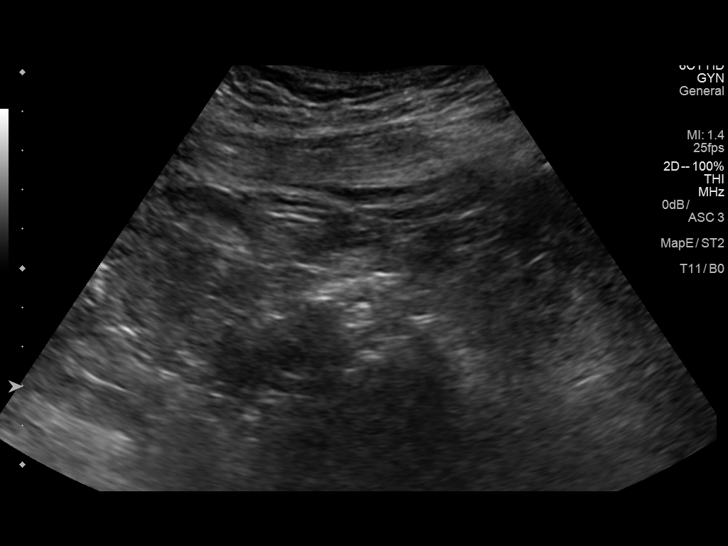
[im 32/69]
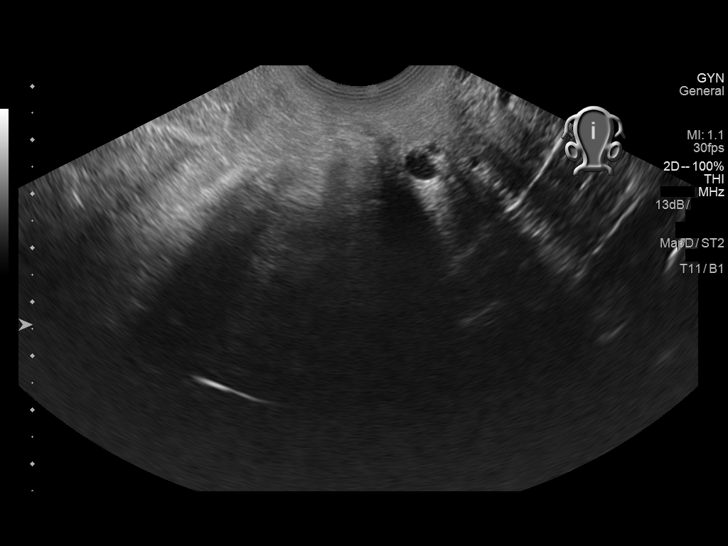
[im 37/69]
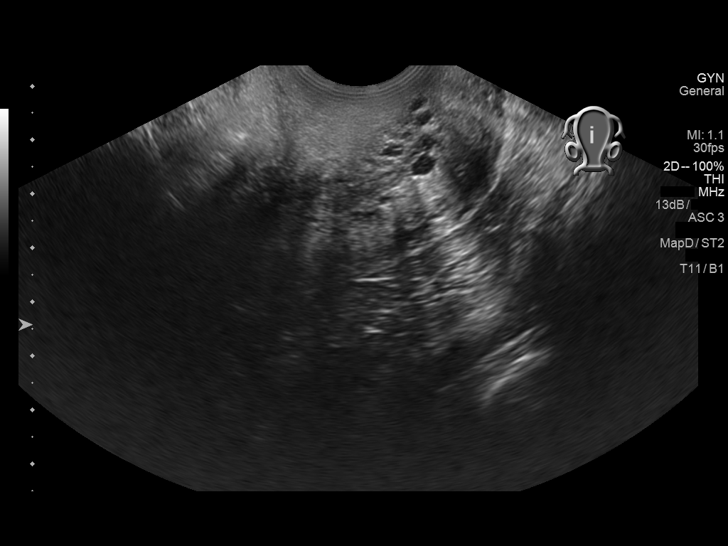
[im 43/69]
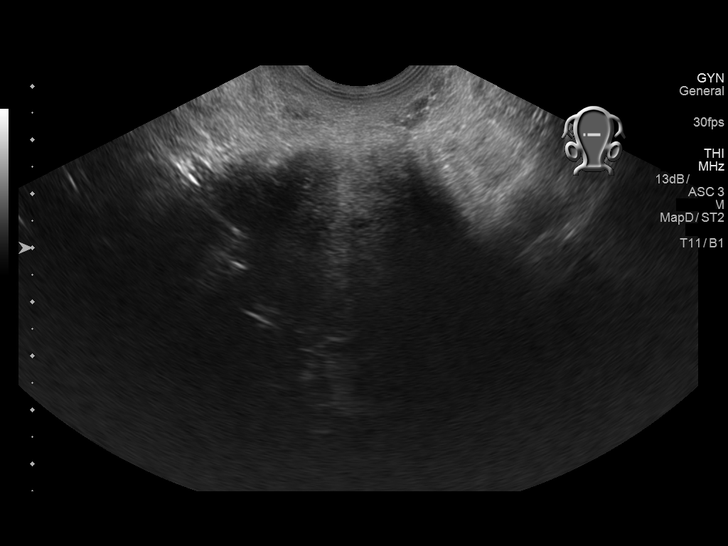
[im 46/69]
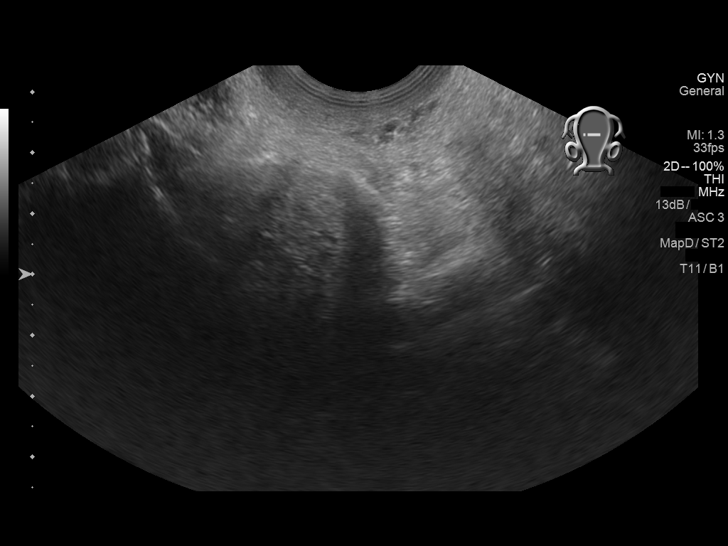
[im 52/69]
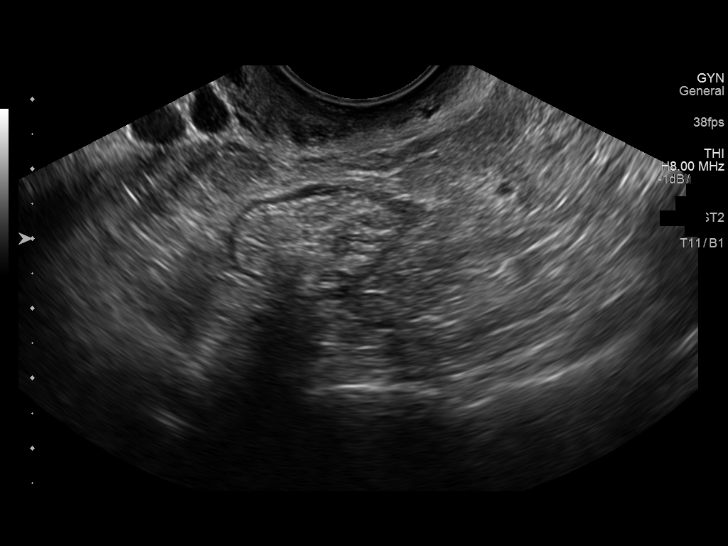
[im 57/69]
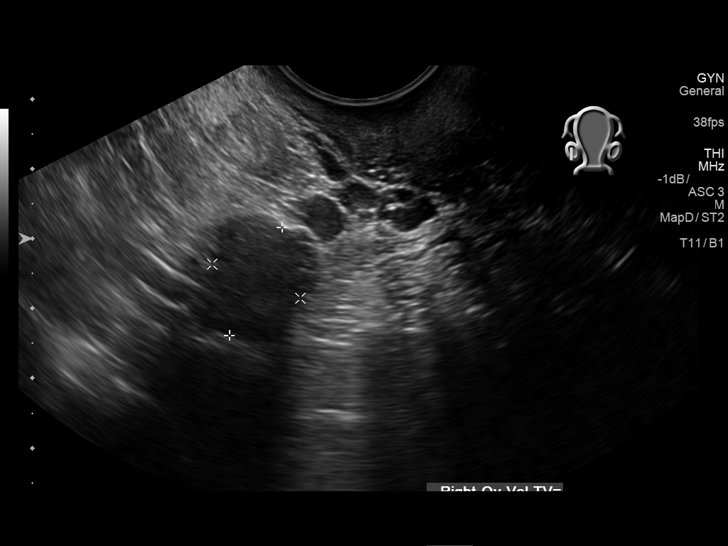
[im 63/69]
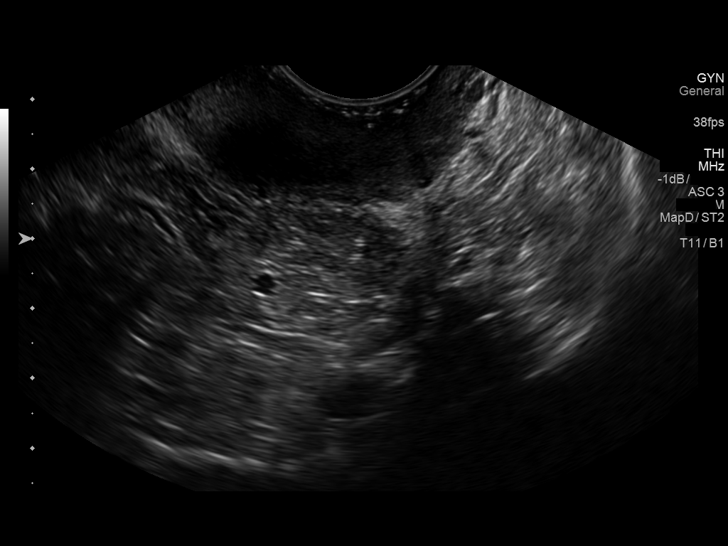
[im 69/69]
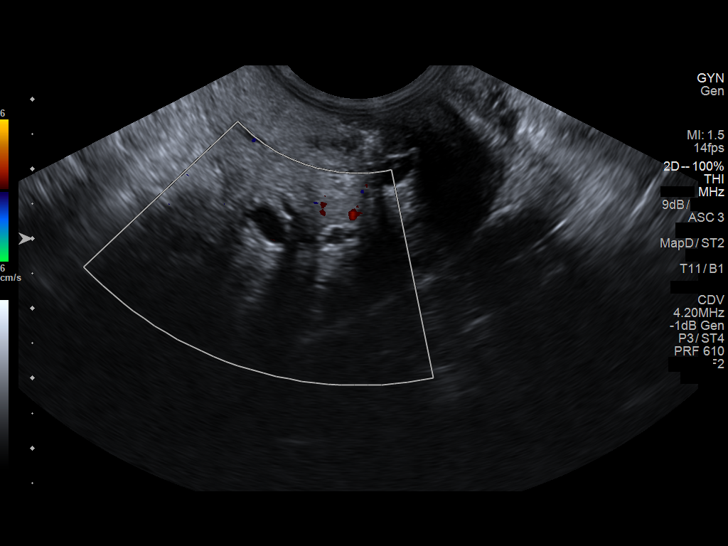

[14 of 25 positions shown; findings below may reference images not displayed]

FINDINGS: Uterus

Measurements: 7.9 x 4.4 x 4.1 cm. No fibroids or other mass
visualized.

Endometrium

Thickness: 3.0 mm.  No focal abnormality visualized.

Right ovary

Measurements: 2.2 x 1.6 x 1.5 cm. Normal appearance/no adnexal mass.

Left ovary

Measurements: 3.2 x 2.0 x 1.3 cm. Poorly visualized and only seen
transabdominally. No gross abnormality.

Other findings

No abnormal free fluid.
IMPRESSION: No acute abnormality.

## 2016-12-20 DIAGNOSIS — J449 Chronic obstructive pulmonary disease, unspecified: Secondary | ICD-10-CM | POA: Diagnosis not present

## 2016-12-20 DIAGNOSIS — E1165 Type 2 diabetes mellitus with hyperglycemia: Secondary | ICD-10-CM | POA: Diagnosis not present

## 2016-12-20 DIAGNOSIS — Z124 Encounter for screening for malignant neoplasm of cervix: Secondary | ICD-10-CM | POA: Diagnosis not present

## 2016-12-20 DIAGNOSIS — J069 Acute upper respiratory infection, unspecified: Secondary | ICD-10-CM | POA: Diagnosis not present

## 2016-12-20 DIAGNOSIS — I10 Essential (primary) hypertension: Secondary | ICD-10-CM | POA: Diagnosis not present

## 2016-12-20 DIAGNOSIS — F17210 Nicotine dependence, cigarettes, uncomplicated: Secondary | ICD-10-CM | POA: Diagnosis not present

## 2016-12-20 DIAGNOSIS — Z0001 Encounter for general adult medical examination with abnormal findings: Secondary | ICD-10-CM | POA: Diagnosis not present

## 2016-12-29 IMAGING — CR DG CHEST 1V PORT
1 series · 1 of 1 positions shown · non-contrast
Comparison: 07/31/2015

CLINICAL DATA: Acute onset respiratory distress and shortness of
breath, current history of COPD

EXAM:
PORTABLE CHEST - 1 VIEW

[ap]
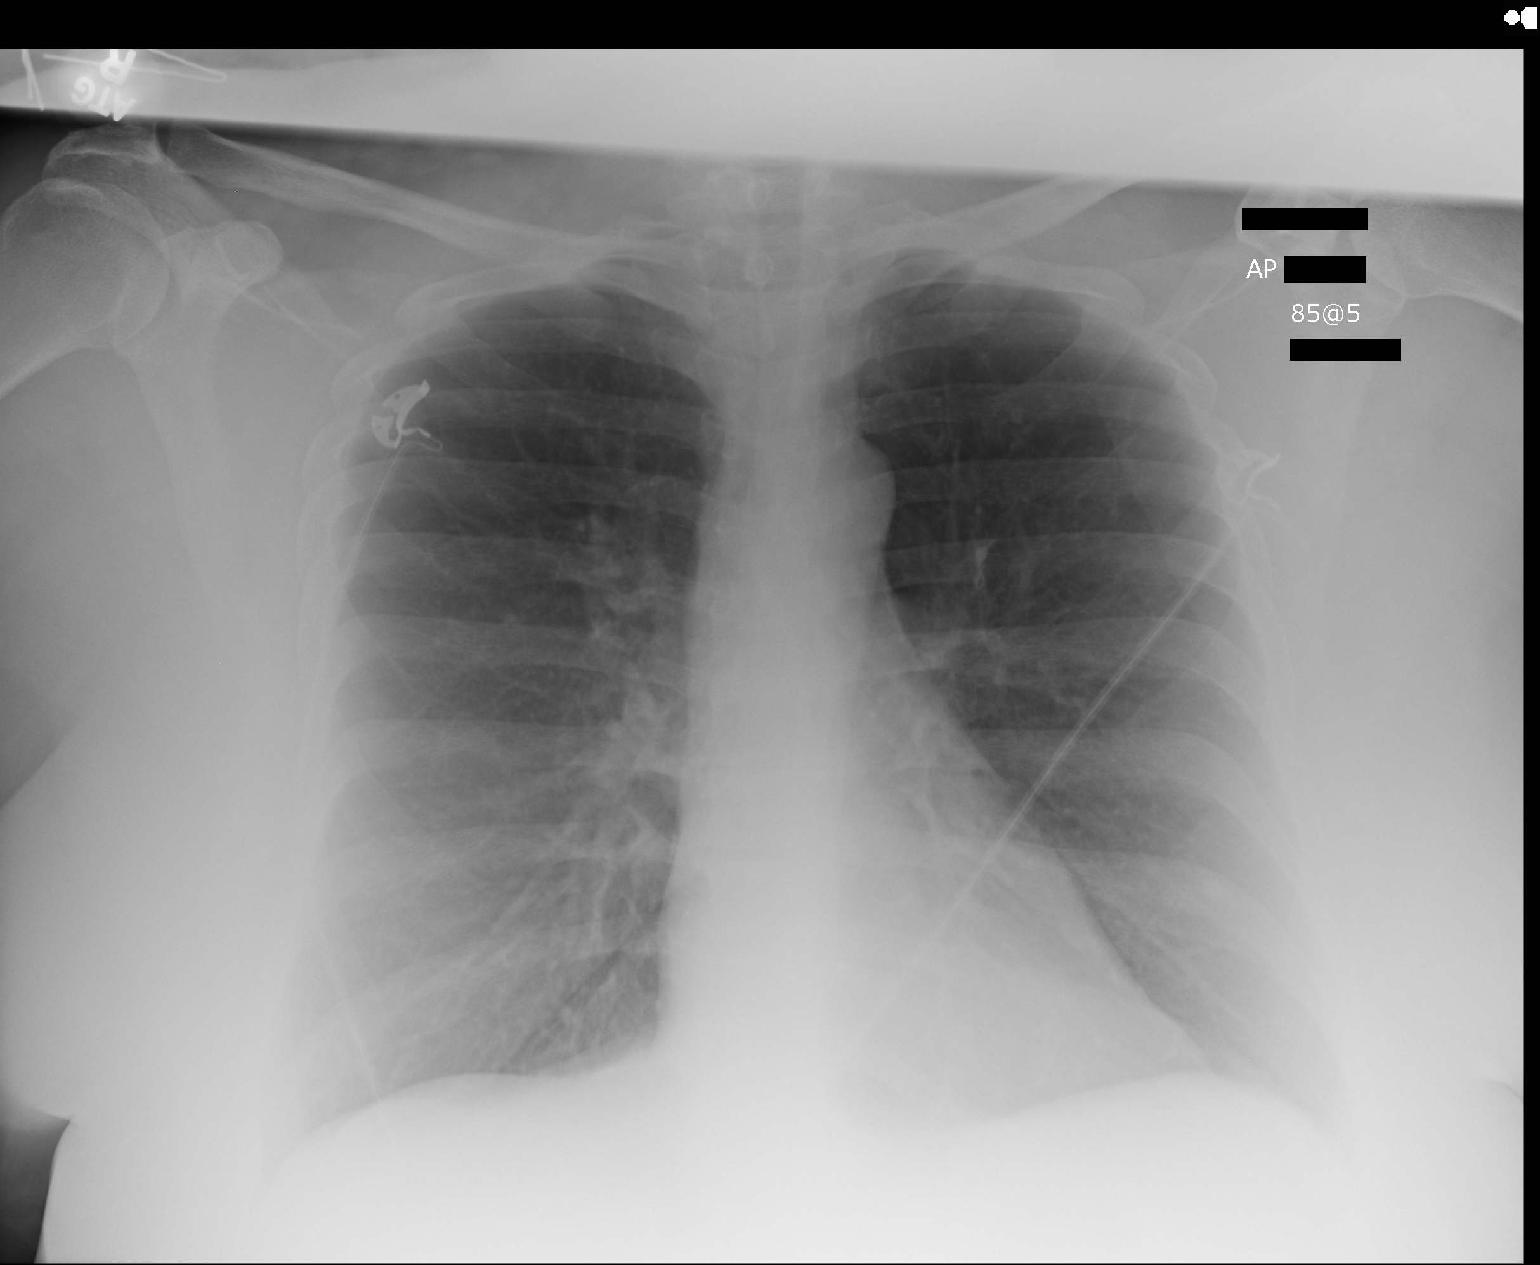

[1 of 1 positions shown; findings below may reference images not displayed]

FINDINGS: Stable hyperinflation. Lungs clear. Heart size and vascular pattern
normal.
IMPRESSION: Evidence of COPD with no acute findings

## 2017-01-11 ENCOUNTER — Other Ambulatory Visit: Payer: Self-pay | Admitting: Nurse Practitioner

## 2017-01-11 DIAGNOSIS — Z1231 Encounter for screening mammogram for malignant neoplasm of breast: Secondary | ICD-10-CM

## 2017-02-04 IMAGING — CR DG CHEST 1V PORT
1 series · 1 of 1 positions shown · non-contrast
Comparison: Chest radiograph from [DATE] 08/30/2015

CLINICAL DATA: Acute onset shortness of breath and wheezing.
Initial encounter.

EXAM:
PORTABLE CHEST 1 VIEW

[portable]
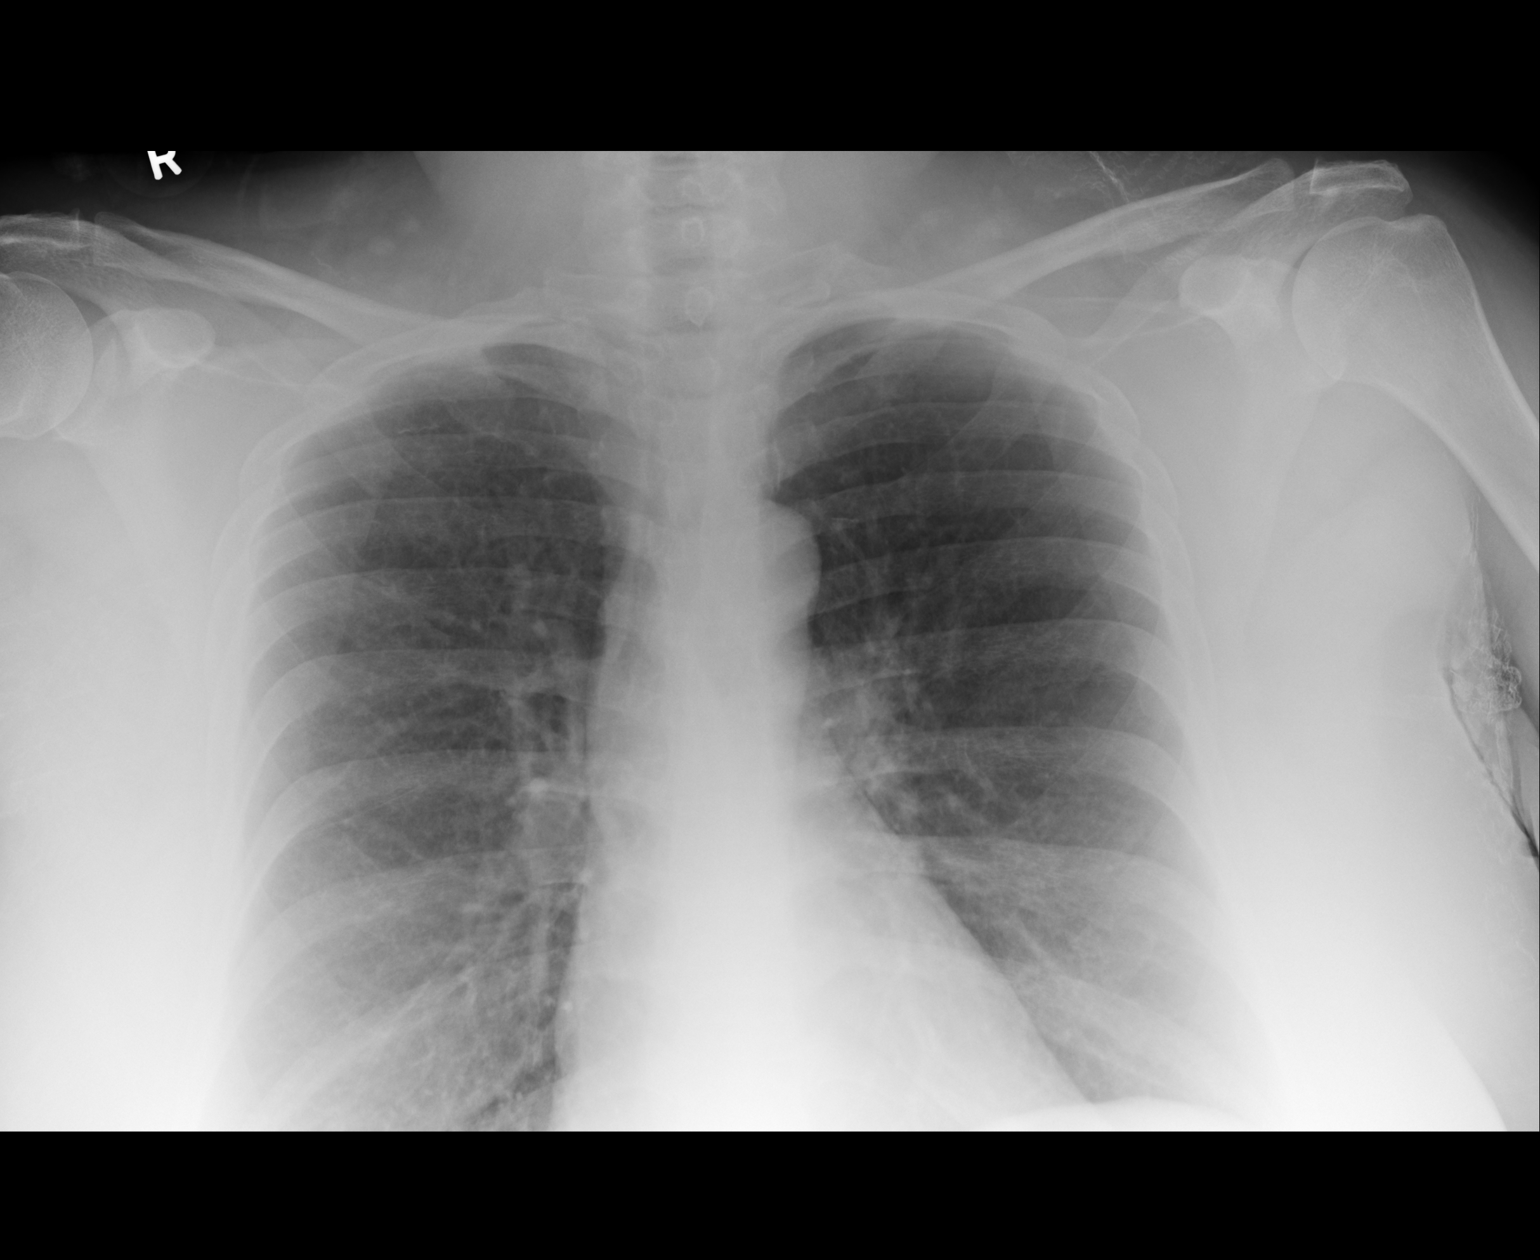

[1 of 1 positions shown; findings below may reference images not displayed]

FINDINGS: The lung bases are incompletely imaged on this study. Pulmonary
vascularity is at the upper limits of normal. No definite pleural
effusion or pneumothorax is seen.

The cardiomediastinal silhouette is normal in size. No acute osseous
abnormalities are identified.
IMPRESSION: No acute cardiopulmonary process seen.

## 2017-02-19 ENCOUNTER — Emergency Department: Payer: Medicare Other

## 2017-02-19 ENCOUNTER — Emergency Department
Admission: EM | Admit: 2017-02-19 | Discharge: 2017-02-19 | Disposition: A | Discharge status: Home / Self Care | Payer: Medicare Other | Attending: Emergency Medicine | Admitting: Emergency Medicine

## 2017-02-19 DIAGNOSIS — I10 Essential (primary) hypertension: Secondary | ICD-10-CM | POA: Insufficient documentation

## 2017-02-19 DIAGNOSIS — E119 Type 2 diabetes mellitus without complications: Secondary | ICD-10-CM | POA: Diagnosis not present

## 2017-02-19 DIAGNOSIS — Z79899 Other long term (current) drug therapy: Secondary | ICD-10-CM | POA: Insufficient documentation

## 2017-02-19 DIAGNOSIS — F17210 Nicotine dependence, cigarettes, uncomplicated: Secondary | ICD-10-CM | POA: Insufficient documentation

## 2017-02-19 DIAGNOSIS — R0602 Shortness of breath: Secondary | ICD-10-CM | POA: Diagnosis present

## 2017-02-19 DIAGNOSIS — J441 Chronic obstructive pulmonary disease with (acute) exacerbation: Secondary | ICD-10-CM

## 2017-02-19 DIAGNOSIS — J45909 Unspecified asthma, uncomplicated: Secondary | ICD-10-CM | POA: Insufficient documentation

## 2017-02-19 DIAGNOSIS — Z7984 Long term (current) use of oral hypoglycemic drugs: Secondary | ICD-10-CM | POA: Diagnosis not present

## 2017-02-19 DIAGNOSIS — R0600 Dyspnea, unspecified: Secondary | ICD-10-CM | POA: Diagnosis not present

## 2017-02-19 LAB — CBC
HCT: 45.8 % (ref 35.0–47.0)
HEMOGLOBIN: 15.6 g/dL (ref 12.0–16.0)
MCH: 29.3 pg (ref 26.0–34.0)
MCHC: 34.1 g/dL (ref 32.0–36.0)
MCV: 85.9 fL (ref 80.0–100.0)
Platelets: 316 10*3/uL (ref 150–440)
RBC: 5.33 MIL/uL — AB (ref 3.80–5.20)
RDW: 13.9 % (ref 11.5–14.5)
WBC: 9.2 10*3/uL (ref 3.6–11.0)

## 2017-02-19 LAB — BASIC METABOLIC PANEL
ANION GAP: 10 (ref 5–15)
BUN: 20 mg/dL (ref 6–20)
CO2: 26 mmol/L (ref 22–32)
Calcium: 9.4 mg/dL (ref 8.9–10.3)
Chloride: 106 mmol/L (ref 101–111)
Creatinine, Ser: 1.27 mg/dL — ABNORMAL HIGH (ref 0.44–1.00)
GFR calc non Af Amer: 48 mL/min — ABNORMAL LOW (ref >60)
GFR, EST AFRICAN AMERICAN: 55 mL/min — AB (ref >60)
Glucose, Bld: 134 mg/dL — ABNORMAL HIGH (ref 65–99)
POTASSIUM: 3.6 mmol/L (ref 3.5–5.1)
Sodium: 142 mmol/L (ref 135–145)

## 2017-02-19 MED ORDER — IPRATROPIUM-ALBUTEROL 0.5-2.5 (3) MG/3ML IN SOLN
3.0000 mL | Freq: Once | RESPIRATORY_TRACT | Status: AC
  Administered 2017-02-19: 3 mL via RESPIRATORY_TRACT

## 2017-02-19 MED ORDER — METHYLPREDNISOLONE SODIUM SUCC 125 MG IJ SOLR
125.0000 mg | Freq: Once | INTRAMUSCULAR | Status: AC
  Administered 2017-02-19: 125 mg via INTRAVENOUS

## 2017-02-19 MED ORDER — PREDNISONE 10 MG PO TABS: ORAL_TABLET | ORAL | 0 refills | Status: DC

## 2017-02-19 MED ORDER — METHYLPREDNISOLONE SODIUM SUCC 125 MG IJ SOLR
INTRAMUSCULAR | Status: AC
  Filled 2017-02-19: qty 2

## 2017-02-19 MED ORDER — ALBUTEROL SULFATE (2.5 MG/3ML) 0.083% IN NEBU
5.0000 mg | INHALATION_SOLUTION | Freq: Once | RESPIRATORY_TRACT | Status: DC
Start: 2017-02-19 — End: 2017-02-19

## 2017-02-19 MED ORDER — IPRATROPIUM-ALBUTEROL 0.5-2.5 (3) MG/3ML IN SOLN
RESPIRATORY_TRACT | Status: DC
Start: 2017-02-19 — End: 2017-02-19
  Filled 2017-02-19: qty 6

## 2017-02-19 NOTE — ED Notes (Signed)
Peripheral IV removed by dr Shaune Pollacklord. Pt demanding to leave. AMA paper signed because pt would not wait to sign computer. Still wheezing and increased WOB with exertion.

## 2017-02-19 NOTE — ED Provider Notes (Signed)
[facility] Emergency Department Provider Note ____________________________________________   I have reviewed the triage vital signs and the triage nursing note.  HISTORY  Chief Complaint Wheezing and Shortness of Breath   Historian Patient  HPI Samantha [name] is a 51 y.o. female with history of asthma and syncope presents with 2 days worsening dyspnea and wheezing nonproductive cough although she states that she expectorated just a couple of times with a slight yellow discoloration.no fever. No dizziness or syncope. No chest pain.  Symptoms are moderate to severe.    Past Medical History:  Diagnosis Date  . Asthma   . COPD (chronic obstructive pulmonary disease) (HCC)   . Diabetes mellitus without complication (HCC)   . Hypertension     Patient Active Problem List   Diagnosis Date Noted  . Acute on chronic respiratory failure (HCC) 10/06/2015  . Acute on chronic respiratory failure with hypoxemia (HCC) 08/30/2015    Past Surgical History:  Procedure Laterality Date  . none      Prior to Admission medications   Medication Sig Start Date End Date Taking? Authorizing Provider  albuterol (PROVENTIL HFA;VENTOLIN HFA) 108 (90 Base) MCG/ACT inhaler Inhale 2 puffs into the lungs every 6 (six) hours as needed for wheezing or shortness of breath. 04/08/16   Gayla Doss, MD  budesonide-formoterol (SYMBICORT) 160-4.5 MCG/ACT inhaler Inhale 2 puffs into the lungs every 12 (twelve) hours.    Historical Provider, MD  gabapentin (NEURONTIN) 300 MG capsule Take 300 mg by mouth 2 (two) times daily.    Historical Provider, MD  glimepiride (AMARYL) 4 MG tablet Take 4 mg by mouth daily.    Historical Provider, MD  levofloxacin (LEVAQUIN) 750 MG tablet Take 1 tablet (750 mg total) by mouth daily. Patient not taking: Reported on 08/19/2016 04/08/16   Gayla Doss, MD  losartan-hydrochlorothiazide (HYZAAR) 50-12.5 MG tablet Take 1 tablet by mouth daily. 02/22/16    Historical Provider, MD  metFORMIN (GLUCOPHAGE) 850 MG tablet Take 1 tablet by mouth daily. 12/11/15   Historical Provider, MD  montelukast (SINGULAIR) 10 MG tablet Take 10 mg by mouth at bedtime.     Historical Provider, MD  predniSONE (DELTASONE) 10 MG tablet 50 mg by mouth daily for 4 more days 02/19/17   Governor Rooks, MD  tiotropium (SPIRIVA HANDIHALER) 18 MCG inhalation capsule Place 1 capsule (18 mcg total) into inhaler and inhale every morning. 10/08/15   Gale Journey, MD    Allergies  Allergen Reactions  . Percocet [Oxycodone-Acetaminophen] Itching    Family History  Problem Relation Age of Onset  . Asthma Mother   . Asthma Sister     Social History Social History  Substance Use Topics  . Smoking status: Current Every Day Smoker    Packs/day: 1.00    Years: 23.00    Types: Cigarettes  . Smokeless tobacco: Never Used  . Alcohol use Yes    Review of Systems  Constitutional: Negative for fever. Eyes: Negative for visual changes. ENT: Negative for sore throat. Cardiovascular: Negative for chest pain. Respiratory: positive for shortness of breath. Gastrointestinal: Negative for abdominal pain, vomiting and diarrhea. Genitourinary: Negative for dysuria. Musculoskeletal: Negative for back pain. Skin: Negative for rash. Neurological: Negative for headache. 10 point Review of Systems otherwise negative ____________________________________________   PHYSICAL EXAM:  VITAL SIGNS: ED Triage Vitals [02/19/17 1153]  Enc Vitals Group     BP (!) 140/118     Pulse Rate (!) 105     Resp (!)  26     Temp 98.2 F (36.8 C)     Temp Source Oral     SpO2 93 %     Weight 270 lb (122.5 kg)     Height      Head Circumference      Peak Flow      Pain Score      Pain Loc      Pain Edu?      Excl. in GC?      Constitutional: Alert and oriented. Well appearing and in no distress. HEENT   Head: Normocephalic and atraumatic.      Eyes: Conjunctivae are normal.  PERRL. Normal extraocular movements.      Ears:         Nose: No congestion/rhinnorhea.   Mouth/Throat: Mucous membranes are moist.   Neck: No stridor. Cardiovascular/Chest: Normal rate, regular rhythm.  No murmurs, rubs, or gallops. Respiratory:tachypnea with extremely tight breath sounds. End expiratory wheezing in all fields. Mild posterior rhonchi. Gastrointestinal: Soft. No distention, no guarding, no rebound. Nontender.    Genitourinary/rectal:Deferred Musculoskeletal: Nontender with normal range of motion in all extremities. No joint effusions.  No lower extremity tenderness.  No edema. Neurologic:  Normal speech and language. No gross or focal neurologic deficits are appreciated. Skin:  Skin is warm, dry and intact. No rash noted. Psychiatric: Mood and affect are normal. Speech and behavior are normal. Patient exhibits appropriate insight and judgment.   ____________________________________________  LABS (pertinent positives/negatives)  Labs Reviewed  BASIC METABOLIC PANEL - Abnormal; Notable for the following:       Result Value   Glucose, Bld 134 (*)    Creatinine, Ser 1.27 (*)    GFR calc non Af Amer 48 (*)    GFR calc Af Amer 55 (*)    All other components within normal limits  CBC - Abnormal; Notable for the following:    RBC 5.33 (*)    All other components within normal limits    ____________________________________________    EKG I, Governor Rooksebecca Chayce Rullo, MD, the attending physician have personally viewed and interpreted all ECGs.  101 bpm. Sinus tachycardia. Narrow QRS. Normal axis. Nonspecific SD T-wave____________________________________________  RADIOLOGY All Xrays were viewed by me. Imaging interpreted by Radiologist.  Chest x-ray portable: No active disease. __________________________________________  PROCEDURES  Procedure(s) performed: None  Critical Care performed: None  ____________________________________________   ED COURSE / ASSESSMENT  AND PLAN  Pertinent labs & imaging results that were available during my care of the patient were reviewed by me and considered in my medical decision making (see chart for details).   Samantha [name] was pretty severely dyspneic and had significant wheezing, likely COPD/asthma exacerbation.O2 sat are 92%. Patient was Cyril Loosengiven2 DuoNeb with significant improvementin air movement and wheezing. She was given Solu-Medrol IV.   Patient was walked by the nurse O2 sat dropped to 88% but then right back up to about 91%. Patient was dyspneic, but she states that essentially her baseline. I felt like she was still wheezing and with hypoxia I recommended additional one or 2 treatments and emergency department and continued observation with reevaluation for possibility of home versus hospitalization, the patient was insistent that she was ready to go home.  She is alert and oriented and capable of making her own decisions. She understands the risks and chooses to go home. I did go ahead and put her discharge instructions and she signed her AGAINST MEDICAL ADVICE form.  I did discuss with her and she  understood and was cooperative. She knows how to come back if she has any trouble.    CONSULTATIONS:   none   Patient / Family / Caregiver informed of clinical course, medical decision-making process, and agree with plan.   I discussed return precautions, follow-up instructions, and discharge instructions with patient and/or family.  Discharge instructions:You were evaluated for wheezing and are being treated for COPD/asthma exacerbation with prednisone. Continue to use her albuterol inhaler 2 puffs every 4 hours as needed for wheezing and shortness of breath.  Return to emergency department immediately for any new or worsening trouble breathing, shortness breath, chest pain, fever, dizziness or passing out, or any other symptoms concerning to you.  As we discussed, I recommended further treatment here in the  emergency department and possibly hospital admission, but you chose to go home. You understood our discussion regarding risks of leaving including worsening lung function, low oxygen, heart attack, and death and you chose to go on home today.  ___________________________________________   FINAL CLINICAL IMPRESSION(S) / ED DIAGNOSES   Final diagnoses:  COPD exacerbation (HCC)              Note: This dictation was prepared with Dragon dictation. Any transcriptional errors that result from this process are unintentional    Governor Rooks, MD 02/19/17 1343

## 2017-02-19 NOTE — Discharge Instructions (Signed)
You were evaluated for wheezing and are being treated for COPD/asthma exacerbation with prednisone. Continue to use her albuterol inhaler 2 puffs every 4 hours as needed for wheezing and shortness of breath.  Return to emergency department immediately for any new or worsening trouble breathing, shortness breath, chest pain, fever, dizziness or passing out, or any other symptoms concerning to you.  As we discussed, I recommended further treatment here in the emergency department and possibly hospital admission, but you chose to go home. You understood our discussion regarding risks of leaving including worsening lung function, low oxygen, heart attack, and death and you chose to go on home today.

## 2017-02-19 NOTE — ED Triage Notes (Signed)
Pt arrives to ER via [facility] c/o wheezing and SOB since last night. Pt used inhaler at home without relief. Pt increased WOB at this time. 92% on RA. Wheezing present.

## 2017-02-19 NOTE — ED Notes (Signed)
Increased expiratory wheezing, increased air movement.

## 2017-02-27 ENCOUNTER — Emergency Department
Admission: EM | Admit: 2017-02-27 | Discharge: 2017-02-27 | Disposition: A | Discharge status: Home / Self Care | Payer: Medicare Other | Attending: Emergency Medicine | Admitting: Emergency Medicine

## 2017-02-27 ENCOUNTER — Encounter: Payer: Self-pay | Admitting: *Deleted

## 2017-02-27 ENCOUNTER — Emergency Department: Payer: Medicare Other

## 2017-02-27 DIAGNOSIS — F17210 Nicotine dependence, cigarettes, uncomplicated: Secondary | ICD-10-CM | POA: Diagnosis not present

## 2017-02-27 DIAGNOSIS — Z7984 Long term (current) use of oral hypoglycemic drugs: Secondary | ICD-10-CM | POA: Diagnosis not present

## 2017-02-27 DIAGNOSIS — J441 Chronic obstructive pulmonary disease with (acute) exacerbation: Secondary | ICD-10-CM | POA: Insufficient documentation

## 2017-02-27 DIAGNOSIS — I10 Essential (primary) hypertension: Secondary | ICD-10-CM | POA: Diagnosis not present

## 2017-02-27 DIAGNOSIS — J45909 Unspecified asthma, uncomplicated: Secondary | ICD-10-CM | POA: Insufficient documentation

## 2017-02-27 DIAGNOSIS — Z79899 Other long term (current) drug therapy: Secondary | ICD-10-CM | POA: Insufficient documentation

## 2017-02-27 DIAGNOSIS — E119 Type 2 diabetes mellitus without complications: Secondary | ICD-10-CM | POA: Diagnosis not present

## 2017-02-27 DIAGNOSIS — R0602 Shortness of breath: Secondary | ICD-10-CM | POA: Diagnosis present

## 2017-02-27 LAB — BASIC METABOLIC PANEL
ANION GAP: 7 (ref 5–15)
BUN: 13 mg/dL (ref 6–20)
CALCIUM: 9.4 mg/dL (ref 8.9–10.3)
CO2: 26 mmol/L (ref 22–32)
Chloride: 106 mmol/L (ref 101–111)
Creatinine, Ser: 1.11 mg/dL — ABNORMAL HIGH (ref 0.44–1.00)
GFR calc Af Amer: >60 mL/min (ref >60)
GFR calc non Af Amer: 56 mL/min — ABNORMAL LOW (ref >60)
GLUCOSE: 126 mg/dL — AB (ref 65–99)
Potassium: 4.2 mmol/L (ref 3.5–5.1)
Sodium: 139 mmol/L (ref 135–145)

## 2017-02-27 LAB — CBC
HEMATOCRIT: 45.7 % (ref 35.0–47.0)
HEMOGLOBIN: 15.3 g/dL (ref 12.0–16.0)
MCH: 29 pg (ref 26.0–34.0)
MCHC: 33.5 g/dL (ref 32.0–36.0)
MCV: 86.6 fL (ref 80.0–100.0)
PLATELETS: 365 10*3/uL (ref 150–440)
RBC: 5.27 MIL/uL — ABNORMAL HIGH (ref 3.80–5.20)
RDW: 14 % (ref 11.5–14.5)
WBC: 9.6 10*3/uL (ref 3.6–11.0)

## 2017-02-27 LAB — TROPONIN I

## 2017-02-27 MED ORDER — IPRATROPIUM-ALBUTEROL 0.5-2.5 (3) MG/3ML IN SOLN
RESPIRATORY_TRACT | Status: AC
  Administered 2017-02-27: 3 mL via RESPIRATORY_TRACT
  Filled 2017-02-27: qty 3

## 2017-02-27 MED ORDER — ALBUTEROL SULFATE (2.5 MG/3ML) 0.083% IN NEBU
2.5000 mg | INHALATION_SOLUTION | Freq: Once | RESPIRATORY_TRACT | Status: AC
  Administered 2017-02-27: 2.5 mg via RESPIRATORY_TRACT
  Filled 2017-02-27: qty 3

## 2017-02-27 MED ORDER — METHYLPREDNISOLONE SODIUM SUCC 125 MG IJ SOLR
125.0000 mg | Freq: Once | INTRAMUSCULAR | Status: AC
  Administered 2017-02-27: 125 mg via INTRAVENOUS
  Filled 2017-02-27: qty 2

## 2017-02-27 MED ORDER — PREDNISONE 50 MG PO TABS: 50.0000 mg | ORAL_TABLET | Freq: Every day | ORAL | 0 refills | Status: DC

## 2017-02-27 MED ORDER — IPRATROPIUM-ALBUTEROL 0.5-2.5 (3) MG/3ML IN SOLN
3.0000 mL | Freq: Once | RESPIRATORY_TRACT | Status: AC
  Administered 2017-02-27: 3 mL via RESPIRATORY_TRACT

## 2017-02-27 MED ORDER — IPRATROPIUM-ALBUTEROL 0.5-2.5 (3) MG/3ML IN SOLN: 3.0000 mL | Freq: Once | RESPIRATORY_TRACT | Status: DC

## 2017-02-27 NOTE — ED Triage Notes (Addendum)
States hx of COPD, states wheezing and SOB for 2 days, no relief with breathing txs at home, pt wheezing and tachpenic in triage , pt wears 2 L Dyer at night

## 2017-02-27 NOTE — ED Provider Notes (Signed)
[facility] Emergency Department Provider Note   ____________________________________________    I have reviewed the triage vital signs and the nursing notes.   HISTORY  Chief Complaint Wheezing and Shortness of Breath     HPI Samantha [name] is a 51 y.o. female who presents with wheezing and shortness of breath. Patient reports a history of COPD and notes over the last 2 days her breathing has steadily worsened. She denies fevers or chills. She reports dry cough. She denies recent travel. No calf pain or swelling. No chest pain although she has mild chest tightness similar to prior COPD episodes. No nausea or vomiting.   Past Medical History:  Diagnosis Date  . Asthma   . COPD (chronic obstructive pulmonary disease) (HCC)   . Diabetes mellitus without complication (HCC)   . Hypertension     Patient Active Problem List   Diagnosis Date Noted  . Acute on chronic respiratory failure (HCC) 10/06/2015  . Acute on chronic respiratory failure with hypoxemia (HCC) 08/30/2015    Past Surgical History:  Procedure Laterality Date  . none      Prior to Admission medications   Medication Sig Start Date End Date Taking? Authorizing Provider  albuterol (PROVENTIL HFA;VENTOLIN HFA) 108 (90 Base) MCG/ACT inhaler Inhale 2 puffs into the lungs every 6 (six) hours as needed for wheezing or shortness of breath. 04/08/16   Gayla DossEryka A Gayle, MD  budesonide-formoterol (SYMBICORT) 160-4.5 MCG/ACT inhaler Inhale 2 puffs into the lungs every 12 (twelve) hours.    Historical Provider, MD  gabapentin (NEURONTIN) 300 MG capsule Take 300 mg by mouth 2 (two) times daily.    Historical Provider, MD  glimepiride (AMARYL) 4 MG tablet Take 4 mg by mouth daily.    Historical Provider, MD  levofloxacin (LEVAQUIN) 750 MG tablet Take 1 tablet (750 mg total) by mouth daily. Patient not taking: Reported on 08/19/2016 04/08/16   Gayla DossEryka A Gayle, MD  losartan-hydrochlorothiazide (HYZAAR)  50-12.5 MG tablet Take 1 tablet by mouth daily. 02/22/16   Historical Provider, MD  metFORMIN (GLUCOPHAGE) 850 MG tablet Take 1 tablet by mouth daily. 12/11/15   Historical Provider, MD  montelukast (SINGULAIR) 10 MG tablet Take 10 mg by mouth at bedtime.     Historical Provider, MD  predniSONE (DELTASONE) 50 MG tablet Take 1 tablet (50 mg total) by mouth daily with breakfast. 02/27/17   Jene Everyobert Jeyson Deshotel, MD  tiotropium (SPIRIVA HANDIHALER) 18 MCG inhalation capsule Place 1 capsule (18 mcg total) into inhaler and inhale every morning. 10/08/15   Gale Journeyatherine P Walsh, MD     Allergies Percocet [oxycodone-acetaminophen]  Family History  Problem Relation Age of Onset  . Asthma Mother   . Asthma Sister     Social History Social History  Substance Use Topics  . Smoking status: Current Every Day Smoker    Packs/day: 1.00    Years: 23.00    Types: Cigarettes  . Smokeless tobacco: Never Used  . Alcohol use Yes    Review of Systems  Constitutional: No fever/chills Eyes: No visual changes.  ENT: No Throat swelling Cardiovascular: As above Respiratory: As above Gastrointestinal: No abdominal pain.  No nausea, no vomiting.    Musculoskeletal: Negative for myalgias Skin: Negative for rash. Neurological: Negative for headaches   10-point ROS otherwise negative.  ____________________________________________   PHYSICAL EXAM:  VITAL SIGNS: ED Triage Vitals [02/27/17 0859]  Enc Vitals Group     BP (!) 155/100     Pulse Rate Marland Kitchen(!)  106     Resp (!) 26     Temp 98.1 F (36.7 C)     Temp Source Oral     SpO2 93 %     Weight 270 lb (122.5 kg)     Height 5\' 7"  (1.702 m)     Head Circumference      Peak Flow      Pain Score      Pain Loc      Pain Edu?      Excl. in GC?     Constitutional: Alert and oriented. No acute distress. Pleasant and interactive Eyes: Conjunctivae are normal.   Nose: No congestion/rhinnorhea. Mouth/Throat: Mucous membranes are moist.    Cardiovascular:  Tachycardia, regular rhythm. Grossly normal heart sounds.  Good peripheral circulation. Respiratory: Increased respiratory effort with tachypnea, diffuse wheezing Gastrointestinal: Soft and nontender. No distention.  No CVA tenderness. Genitourinary: deferred Musculoskeletal: No lower extremity tenderness nor edema.  Warm and well perfused Neurologic:  Normal speech and language. No gross focal neurologic deficits are appreciated.  Skin:  Skin is warm, dry and intact. No rash noted. Psychiatric: Mood and affect are normal. Speech and behavior are normal.  ____________________________________________   LABS (all labs ordered are listed, but only abnormal results are displayed)  Labs Reviewed  BASIC METABOLIC PANEL - Abnormal; Notable for the following:       Result Value   Glucose, Bld 126 (*)    Creatinine, Ser 1.11 (*)    GFR calc non Af Amer 56 (*)    All other components within normal limits  CBC - Abnormal; Notable for the following:    RBC 5.27 (*)    All other components within normal limits  TROPONIN I   ____________________________________________  EKG  ED ECG REPORT I, Jene Every, the attending physician, personally viewed and interpreted this ECG.  Date: 02/27/2017  Rate: 105 Rhythm: Sinus tachycardia QRS Axis: normal Intervals: normal ST/T Wave abnormalities: normal Conduction Disturbances: none   ____________________________________________  RADIOLOGY  Chest x-ray unremarkable ____________________________________________   PROCEDURES  Procedure(s) performed: No    Critical Care performed: No ____________________________________________   INITIAL IMPRESSION / ASSESSMENT AND PLAN / ED COURSE  Pertinent labs & imaging results that were available during my care of the patient were reviewed by me and considered in my medical decision making (see chart for details).  Patient presents with shortness of breath with diffuse wheezing likely  related to COPD exacerbation. We will check labs, chest x-ray give Solu-Medrol and DuoNeb's and reevaluate.   ----------------------------------------- 11:00 AM on 02/27/2017 -----------------------------------------  Patient's lab work and chest x-ray are reassuring. She feels significantly better. She still has scattered wheezes on exam, I recommended continued treatment here in the emergency department a possible admission. However the patient states she actually must leave and feels fine and she can use her nebulizer at home as long she has steroids. She agrees to return if any worsening of her condition and understands that my medical advice is for her to stay longer    ____________________________________________   FINAL CLINICAL IMPRESSION(S) / ED DIAGNOSES  Final diagnoses:  COPD exacerbation (HCC)      NEW MEDICATIONS STARTED DURING THIS VISIT:  Discharge Medication List as of 02/27/2017 11:01 AM       Note:  This document was prepared using Dragon voice recognition software and may include unintentional dictation errors.    Jene Every, MD 02/27/17 [phone]

## 2017-03-11 IMAGING — CR DG CHEST 2V
1 series · 2 of 2 positions shown · non-contrast
Comparison: 10/06/2015

CLINICAL DATA: Dyspnea, onset yesterday.

EXAM:
CHEST  2 VIEW

[Series 1: dg chest 2 view · 0.14mm/px · 2 of 2 slices shown]
[im 1/2]
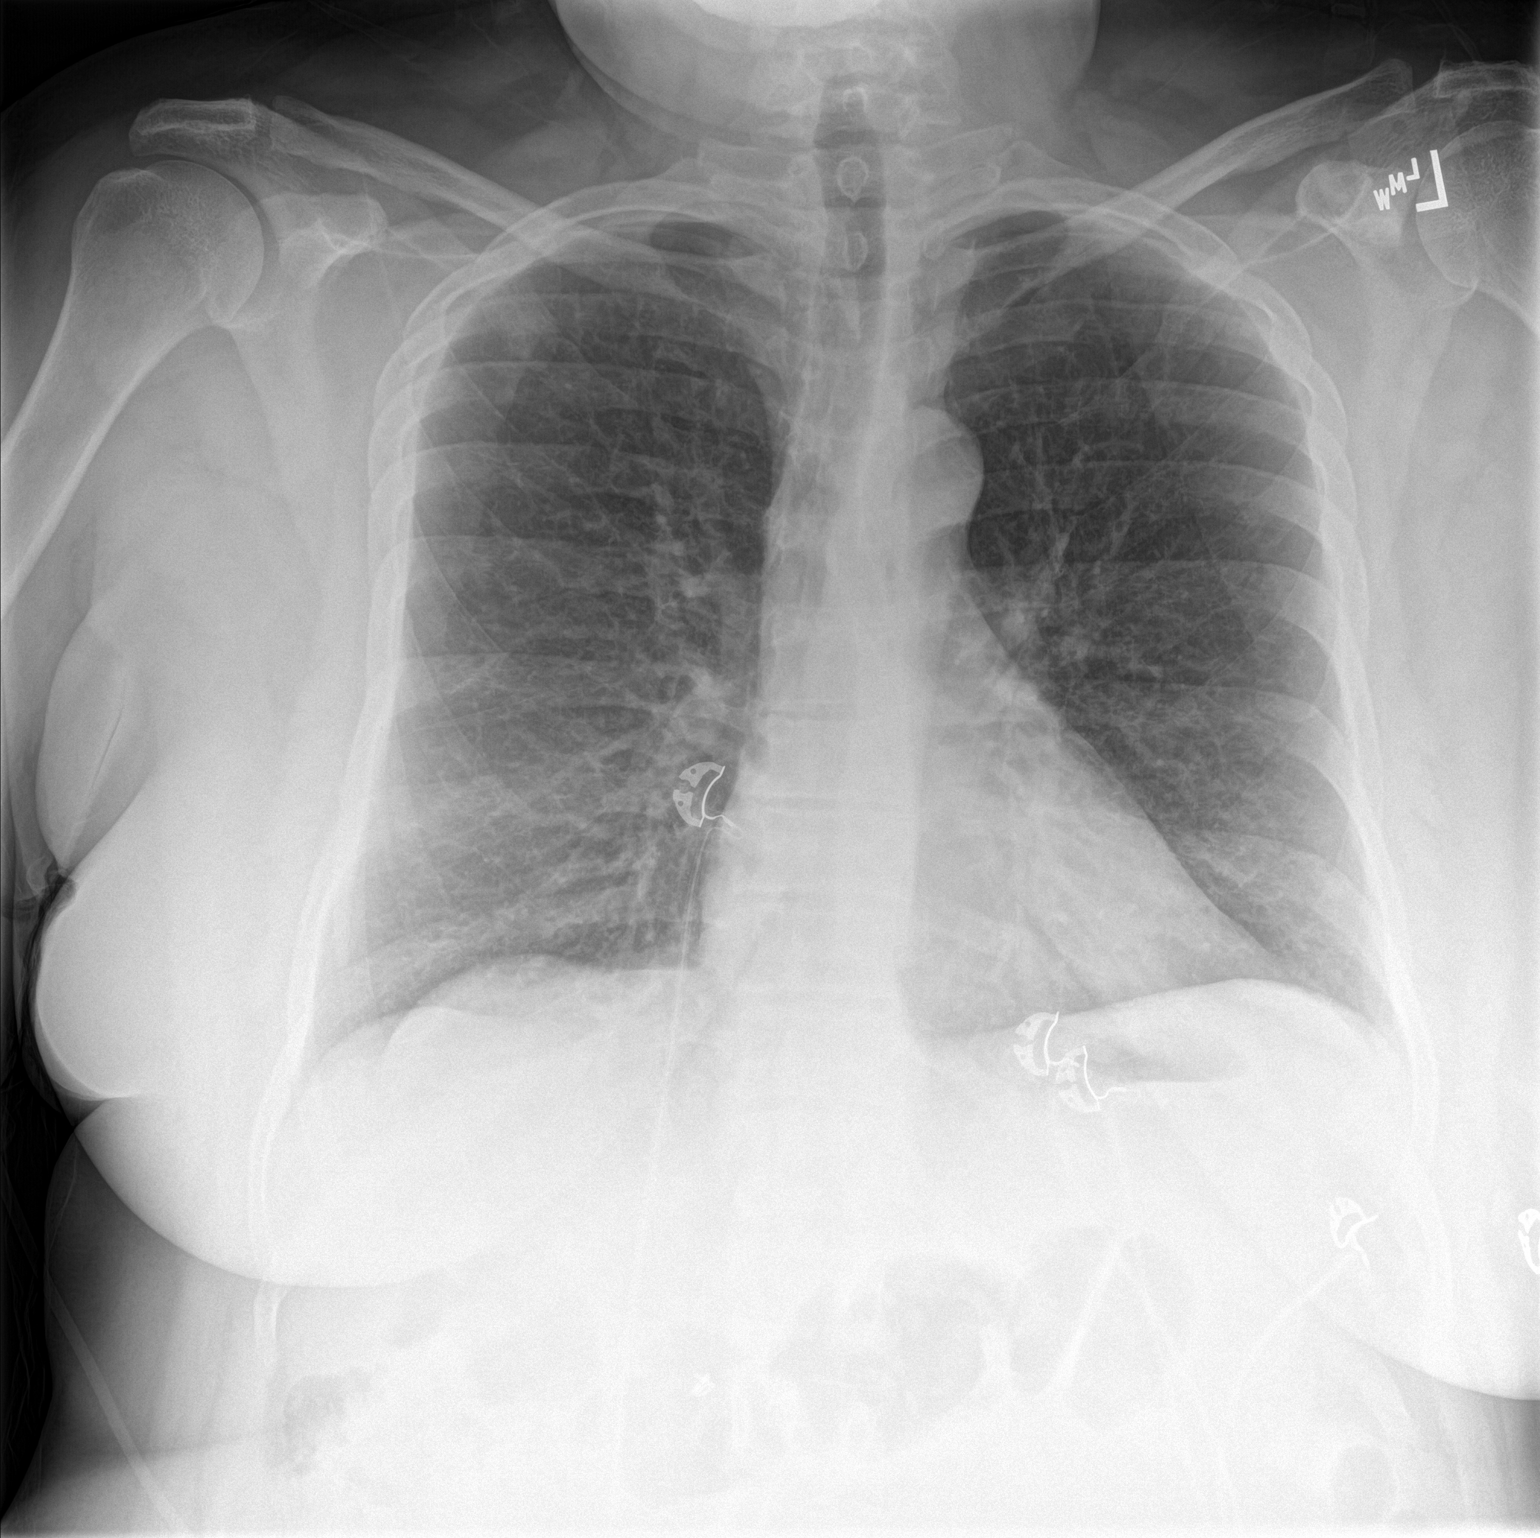
[im 2/2]
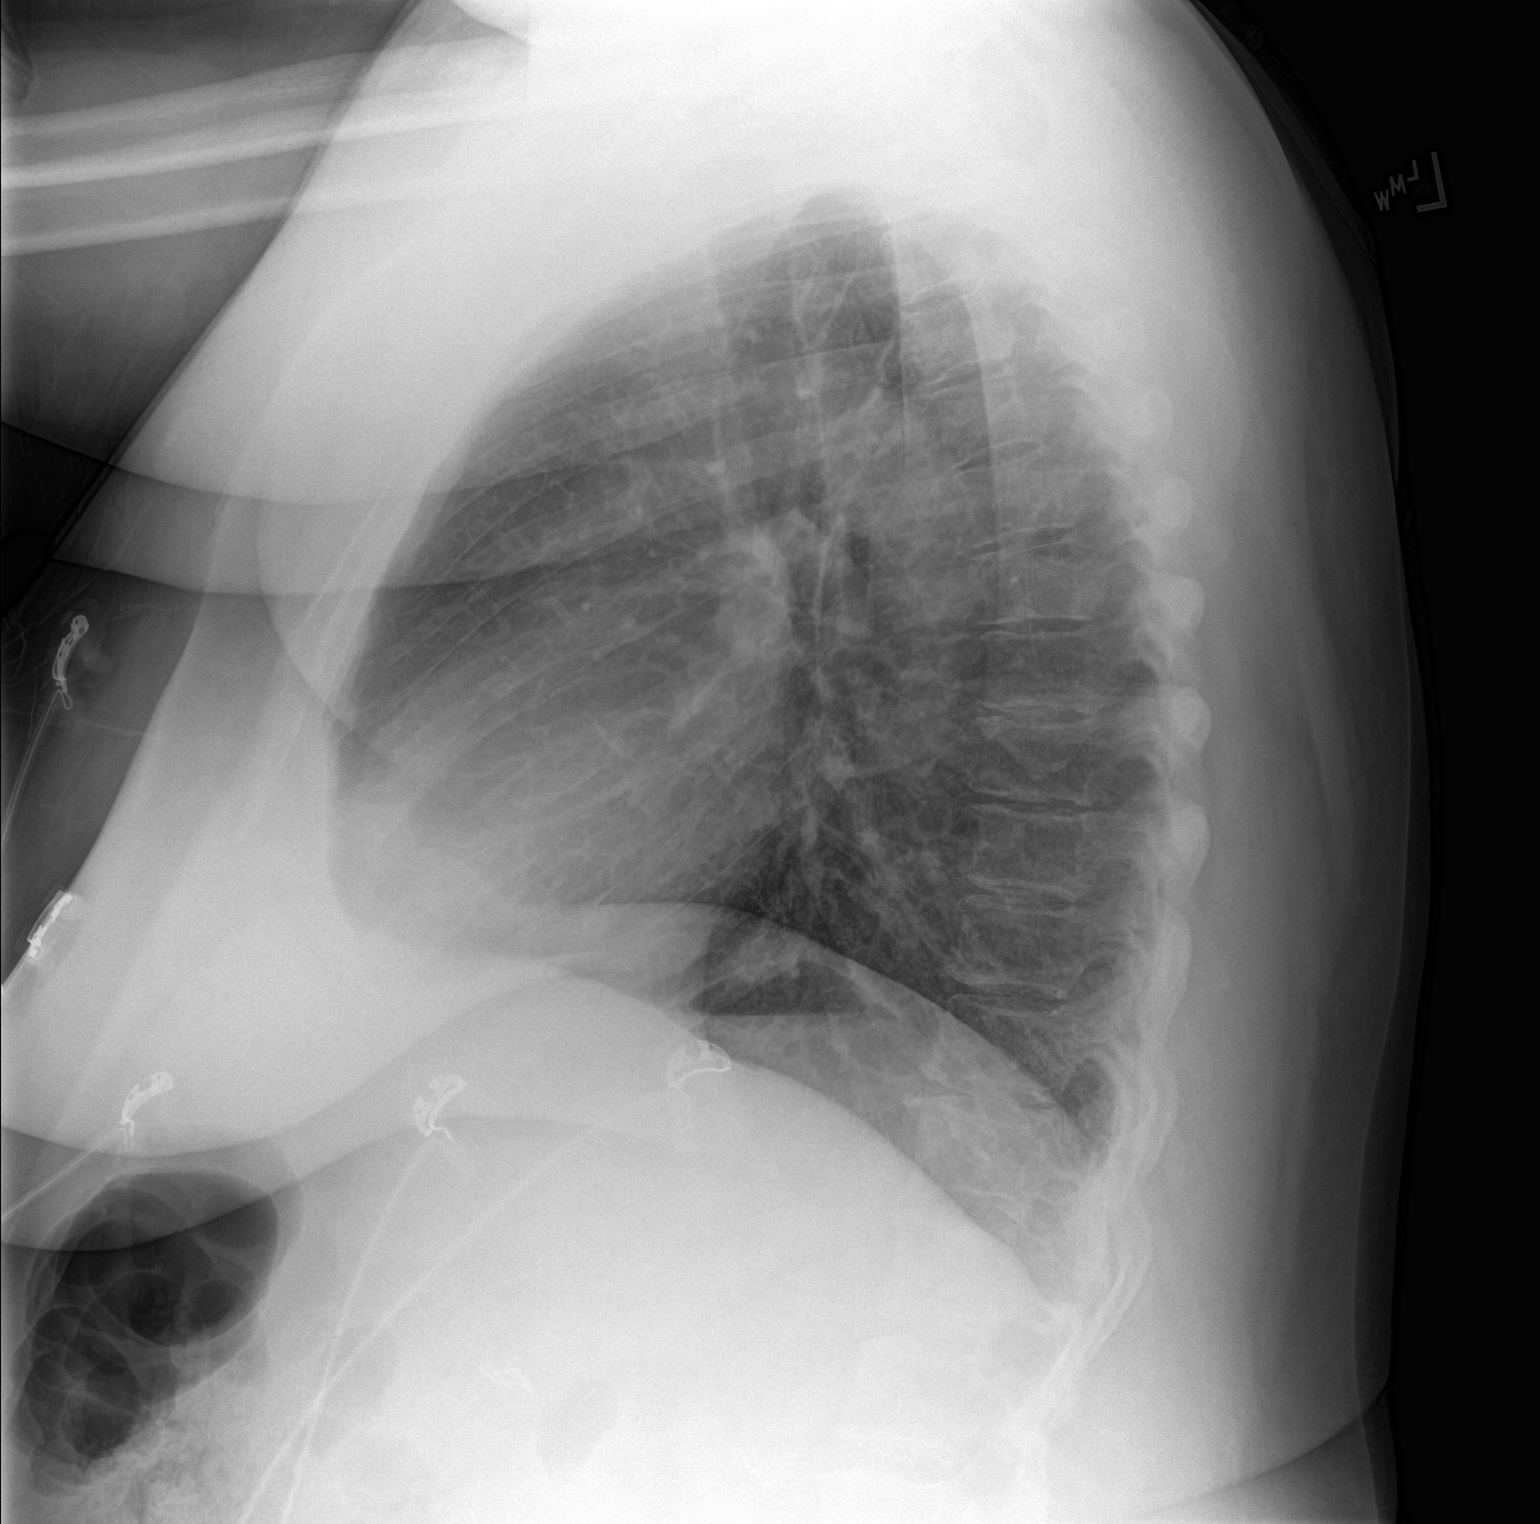

[2 of 2 positions shown; findings below may reference images not displayed]

FINDINGS: The heart size and mediastinal contours are within normal limits.
Both lungs are clear. The visualized skeletal structures are
unremarkable.
IMPRESSION: No active cardiopulmonary disease.

## 2017-03-21 DIAGNOSIS — F17210 Nicotine dependence, cigarettes, uncomplicated: Secondary | ICD-10-CM | POA: Diagnosis not present

## 2017-03-21 DIAGNOSIS — J441 Chronic obstructive pulmonary disease with (acute) exacerbation: Secondary | ICD-10-CM | POA: Diagnosis not present

## 2017-03-21 DIAGNOSIS — J069 Acute upper respiratory infection, unspecified: Secondary | ICD-10-CM | POA: Diagnosis not present

## 2017-03-21 DIAGNOSIS — R062 Wheezing: Secondary | ICD-10-CM | POA: Diagnosis not present

## 2017-03-21 DIAGNOSIS — E1165 Type 2 diabetes mellitus with hyperglycemia: Secondary | ICD-10-CM | POA: Diagnosis not present

## 2017-03-21 DIAGNOSIS — I10 Essential (primary) hypertension: Secondary | ICD-10-CM | POA: Diagnosis not present

## 2017-03-21 DIAGNOSIS — K458 Other specified abdominal hernia without obstruction or gangrene: Secondary | ICD-10-CM | POA: Diagnosis not present

## 2017-03-23 ENCOUNTER — Other Ambulatory Visit (HOSPITAL_COMMUNITY): Payer: Self-pay | Admitting: Nurse Practitioner

## 2017-03-23 ENCOUNTER — Other Ambulatory Visit: Payer: Self-pay | Admitting: Nurse Practitioner

## 2017-03-23 DIAGNOSIS — K429 Umbilical hernia without obstruction or gangrene: Secondary | ICD-10-CM

## 2017-04-10 ENCOUNTER — Ambulatory Visit: Admission: RE | Admit: 2017-04-10 | Payer: Medicare Other | Source: Ambulatory Visit

## 2017-04-12 ENCOUNTER — Ambulatory Visit: Payer: Medicare Other | Admitting: General Surgery

## 2017-04-17 ENCOUNTER — Ambulatory Visit: Payer: Medicare Other | Admitting: General Surgery

## 2017-05-10 ENCOUNTER — Encounter: Payer: Self-pay | Admitting: *Deleted

## 2017-06-21 ENCOUNTER — Emergency Department
Admission: EM | Admit: 2017-06-21 | Discharge: 2017-06-21 | Disposition: A | Discharge status: Home / Self Care | Payer: Medicare Other | Attending: Emergency Medicine | Admitting: Emergency Medicine

## 2017-06-21 ENCOUNTER — Encounter: Payer: Self-pay | Admitting: Emergency Medicine

## 2017-06-21 ENCOUNTER — Emergency Department: Payer: Medicare Other

## 2017-06-21 DIAGNOSIS — I10 Essential (primary) hypertension: Secondary | ICD-10-CM | POA: Diagnosis not present

## 2017-06-21 DIAGNOSIS — Z7984 Long term (current) use of oral hypoglycemic drugs: Secondary | ICD-10-CM | POA: Diagnosis not present

## 2017-06-21 DIAGNOSIS — F17210 Nicotine dependence, cigarettes, uncomplicated: Secondary | ICD-10-CM | POA: Diagnosis not present

## 2017-06-21 DIAGNOSIS — Z79899 Other long term (current) drug therapy: Secondary | ICD-10-CM | POA: Insufficient documentation

## 2017-06-21 DIAGNOSIS — Z7951 Long term (current) use of inhaled steroids: Secondary | ICD-10-CM | POA: Insufficient documentation

## 2017-06-21 DIAGNOSIS — J45909 Unspecified asthma, uncomplicated: Secondary | ICD-10-CM | POA: Insufficient documentation

## 2017-06-21 DIAGNOSIS — J441 Chronic obstructive pulmonary disease with (acute) exacerbation: Secondary | ICD-10-CM | POA: Diagnosis not present

## 2017-06-21 DIAGNOSIS — E119 Type 2 diabetes mellitus without complications: Secondary | ICD-10-CM | POA: Diagnosis not present

## 2017-06-21 DIAGNOSIS — R0602 Shortness of breath: Secondary | ICD-10-CM | POA: Diagnosis not present

## 2017-06-21 LAB — CBC
HCT: 47.3 % — ABNORMAL HIGH (ref 35.0–47.0)
Hemoglobin: 16 g/dL (ref 12.0–16.0)
MCH: 29.3 pg (ref 26.0–34.0)
MCHC: 33.8 g/dL (ref 32.0–36.0)
MCV: 86.7 fL (ref 80.0–100.0)
Platelets: 367 10*3/uL (ref 150–440)
RBC: 5.46 MIL/uL — ABNORMAL HIGH (ref 3.80–5.20)
RDW: 14 % (ref 11.5–14.5)
WBC: 8 10*3/uL (ref 3.6–11.0)

## 2017-06-21 LAB — BASIC METABOLIC PANEL
Anion gap: 8 (ref 5–15)
BUN: 29 mg/dL — AB (ref 6–20)
CALCIUM: 9.9 mg/dL (ref 8.9–10.3)
CO2: 22 mmol/L (ref 22–32)
Chloride: 109 mmol/L (ref 101–111)
Creatinine, Ser: 1.42 mg/dL — ABNORMAL HIGH (ref 0.44–1.00)
GFR calc Af Amer: 48 mL/min — ABNORMAL LOW (ref >60)
GFR calc non Af Amer: 42 mL/min — ABNORMAL LOW (ref >60)
GLUCOSE: 118 mg/dL — AB (ref 65–99)
Potassium: 4.7 mmol/L (ref 3.5–5.1)
Sodium: 139 mmol/L (ref 135–145)

## 2017-06-21 LAB — TROPONIN I

## 2017-06-21 MED ORDER — IPRATROPIUM-ALBUTEROL 0.5-2.5 (3) MG/3ML IN SOLN
3.0000 mL | Freq: Once | RESPIRATORY_TRACT | Status: AC
  Administered 2017-06-21: 3 mL via RESPIRATORY_TRACT
  Filled 2017-06-21: qty 3

## 2017-06-21 MED ORDER — PREDNISONE 20 MG PO TABS: 40.0000 mg | ORAL_TABLET | Freq: Every day | ORAL | 0 refills | Status: DC

## 2017-06-21 MED ORDER — METHYLPREDNISOLONE SODIUM SUCC 125 MG IJ SOLR
125.0000 mg | Freq: Once | INTRAMUSCULAR | Status: AC
  Administered 2017-06-21: 125 mg via INTRAVENOUS
  Filled 2017-06-21: qty 2

## 2017-06-21 NOTE — ED Provider Notes (Signed)
[facility] Emergency Department Provider Note  Time seen: 10:39 AM  I have reviewed the triage vital signs and the nursing notes.   HISTORY  Chief Complaint Shortness of Breath    HPI Samantha [name] is a 51 y.o. female with a past medical history of asthma, COPD, diabetes, hypertension presents to the emergency department for difficulty breathing. According to the patient for the past 2 days she has been having increased shortness of breath along with cough productive of yellow sputum. Denies any fever, chest pain, leg pain or swelling. Patient states a history of COPD and asthma feels like a COPD or asthma exacerbation. No better with home medications. States she usually needs steroids when he gets this bad.  Past Medical History:  Diagnosis Date  . Asthma   . COPD (chronic obstructive pulmonary disease) (HCC)   . Diabetes mellitus without complication (HCC)   . Hypertension     Patient Active Problem List   Diagnosis Date Noted  . Acute on chronic respiratory failure (HCC) 10/06/2015  . Acute on chronic respiratory failure with hypoxemia (HCC) 08/30/2015    Past Surgical History:  Procedure Laterality Date  . none      Prior to Admission medications   Medication Sig Start Date End Date Taking? Authorizing Provider  albuterol (PROVENTIL HFA;VENTOLIN HFA) 108 (90 Base) MCG/ACT inhaler Inhale 2 puffs into the lungs every 6 (six) hours as needed for wheezing or shortness of breath. 04/08/16   Gayla DossGayle, Eryka A, MD  budesonide-formoterol (SYMBICORT) 160-4.5 MCG/ACT inhaler Inhale 2 puffs into the lungs every 12 (twelve) hours.    [provider]  gabapentin (NEURONTIN) 300 MG capsule Take 300 mg by mouth 2 (two) times daily.    [provider]  glimepiride (AMARYL) 4 MG tablet Take 4 mg by mouth daily.    [provider]  levofloxacin (LEVAQUIN) 750 MG tablet Take 1 tablet (750 mg total) by mouth daily. Patient not taking:  Reported on 08/19/2016 04/08/16   Gayla DossGayle, Eryka A, MD  losartan-hydrochlorothiazide (HYZAAR) 50-12.5 MG tablet Take 1 tablet by mouth daily. 02/22/16   [provider]  metFORMIN (GLUCOPHAGE) 850 MG tablet Take 1 tablet by mouth daily. 12/11/15   [provider]  montelukast (SINGULAIR) 10 MG tablet Take 10 mg by mouth at bedtime.     [provider]  predniSONE (DELTASONE) 50 MG tablet Take 1 tablet (50 mg total) by mouth daily with breakfast. 02/27/17   Jene EveryKinner, Robert, MD  tiotropium (SPIRIVA HANDIHALER) 18 MCG inhalation capsule Place 1 capsule (18 mcg total) into inhaler and inhale every morning. 10/08/15   Gale JourneyWalsh, Catherine P, MD    Allergies  Allergen Reactions  . Percocet [Oxycodone-Acetaminophen] Itching    Family History  Problem Relation Age of Onset  . Asthma Mother   . Asthma Sister     Social History Social History  Substance Use Topics  . Smoking status: Current Every Day Smoker    Packs/day: 1.00    Years: 23.00    Types: Cigarettes  . Smokeless tobacco: Never Used  . Alcohol use Yes    Review of Systems Constitutional: Negative for fever. ENT: Mild congestion Cardiovascular: Negative for chest pain. Respiratory: Positive for shortness of breath and cough with yellow sputum Gastrointestinal: Negative for abdominal pain Musculoskeletal: Negative for back pain. Negative for leg pain or swelling. Neurological: Negative for headache All other ROS negative  ____________________________________________   PHYSICAL EXAM:  VITAL SIGNS: ED Triage Vitals  Enc  Vitals Group     BP 06/21/17 1003 (!) 182/117     Pulse Rate 06/21/17 1003 92     Resp 06/21/17 1003 18     Temp 06/21/17 1003 98.4 F (36.9 C)     Temp Source 06/21/17 1003 Oral     SpO2 06/21/17 1003 92 %     Weight --      Height --      Head Circumference --      Peak Flow --      Pain Score 06/21/17 1018 0     Pain Loc --      Pain Edu? --      Excl. in GC? --      Constitutional: Alert and oriented. Well appearing and in no distress. Eyes: Normal exam ENT   Head: Normocephalic and atraumatic.   Mouth/Throat: Mucous membranes are moist. Cardiovascular: Normal rate, regular rhythm.  Respiratory: Mild tachypnea but no respiratory distress. Moderate expiratory wheezes bilaterally without rhonchi or rales. Gastrointestinal: Soft and nontender. No distention.   Musculoskeletal: Nontender with normal range of motion in all extremities. No lower extremity tenderness or edema. Neurologic:  Normal speech and language. No gross focal neurologic deficits Skin:  Skin is warm, dry and intact.  Psychiatric: Mood and affect are normal.   ____________________________________________    EKG  EKG reviewed and interpreted by myself  ____________________________________________    RADIOLOGY  Chest x-ray negative  ____________________________________________   INITIAL IMPRESSION / ASSESSMENT AND PLAN / ED COURSE  Pertinent labs & imaging results that were available during my care of the patient were reviewed by me and considered in my medical decision making (see chart for details).  Patient presents to the emergency department with shortness of breath worse over the past 2 days with cough and yellow sputum production. Patient's chest x-ray is negative, labs within normal limits, troponin is negative, EKG is reassuring. Highly suspect COPD exacerbation.  Patient states she is feeling much better after steroids and breathing treatments. She still has a slight expiratory wheeze but is requesting that we discharge her home. We'll place the patient on prednisone for the next 5 days she is to use her home breathing treatments and follow-up with her doctor. Patient agreeable to this plan.  ____________________________________________   FINAL CLINICAL IMPRESSION(S) / ED DIAGNOSES  COPD exacerbation    Minna Antis, MD 06/21/17 1130

## 2017-06-21 NOTE — ED Notes (Signed)
Pt states she is feeling better and ready to go home, pt is maintaining O2 sat 96% on RA for the past after having 2 duoneb and soulmedrol administered. EDP notified.

## 2017-06-21 NOTE — ED Triage Notes (Signed)
Pt reports shortness of breath x2 days, hx of COPD and asthma.

## 2017-07-05 DIAGNOSIS — R0602 Shortness of breath: Secondary | ICD-10-CM | POA: Diagnosis not present

## 2017-07-05 DIAGNOSIS — J441 Chronic obstructive pulmonary disease with (acute) exacerbation: Secondary | ICD-10-CM | POA: Diagnosis not present

## 2017-07-05 DIAGNOSIS — J069 Acute upper respiratory infection, unspecified: Secondary | ICD-10-CM | POA: Diagnosis not present

## 2017-07-05 DIAGNOSIS — R05 Cough: Secondary | ICD-10-CM | POA: Diagnosis not present

## 2017-07-05 DIAGNOSIS — R062 Wheezing: Secondary | ICD-10-CM | POA: Diagnosis not present

## 2017-07-05 IMAGING — CR DG CHEST 2V
1 series · 2 of 2 positions shown · non-contrast
Comparison: 11/10/2015

CLINICAL DATA: Cough, shortness of breath

EXAM:
CHEST  2 VIEW

[Series 1: dg chest 2 view · 0.14mm/px · 2 of 2 slices shown]
[im 1/2]
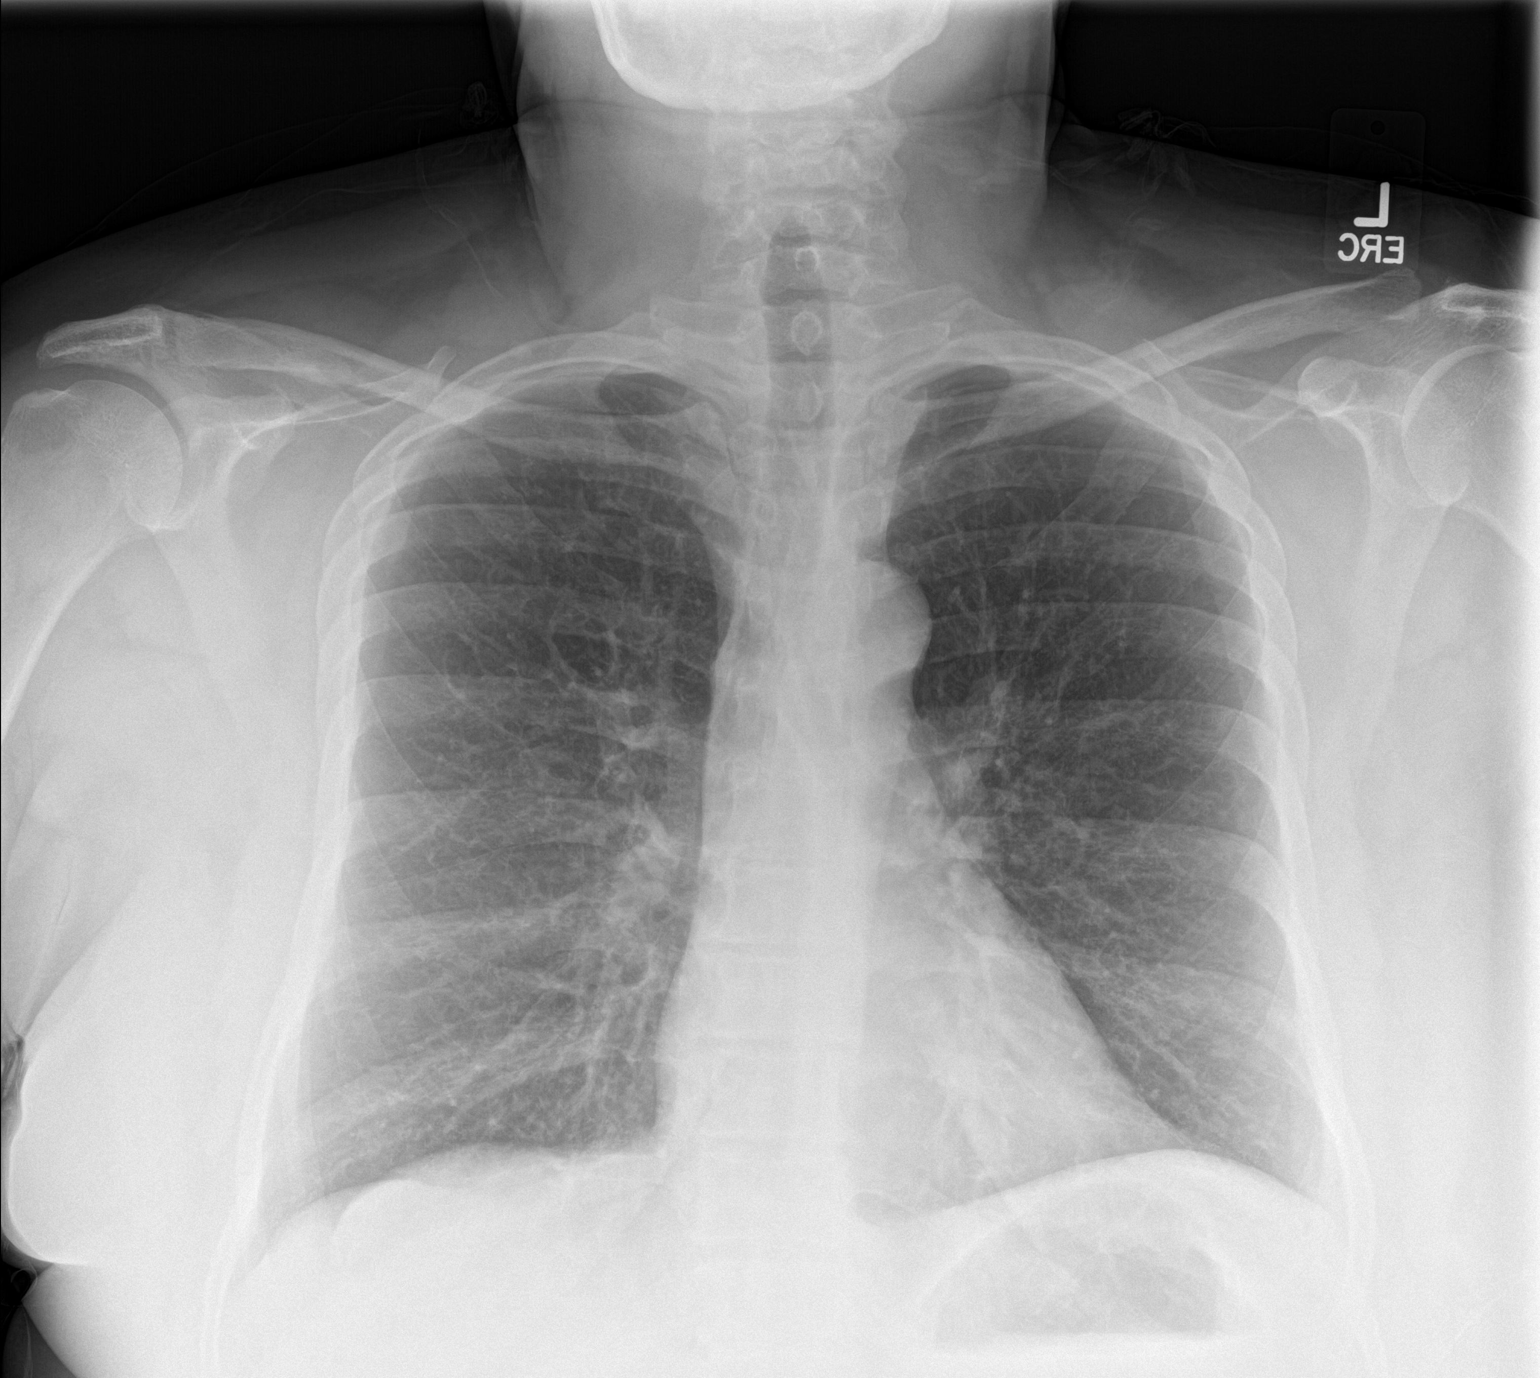
[im 2/2]
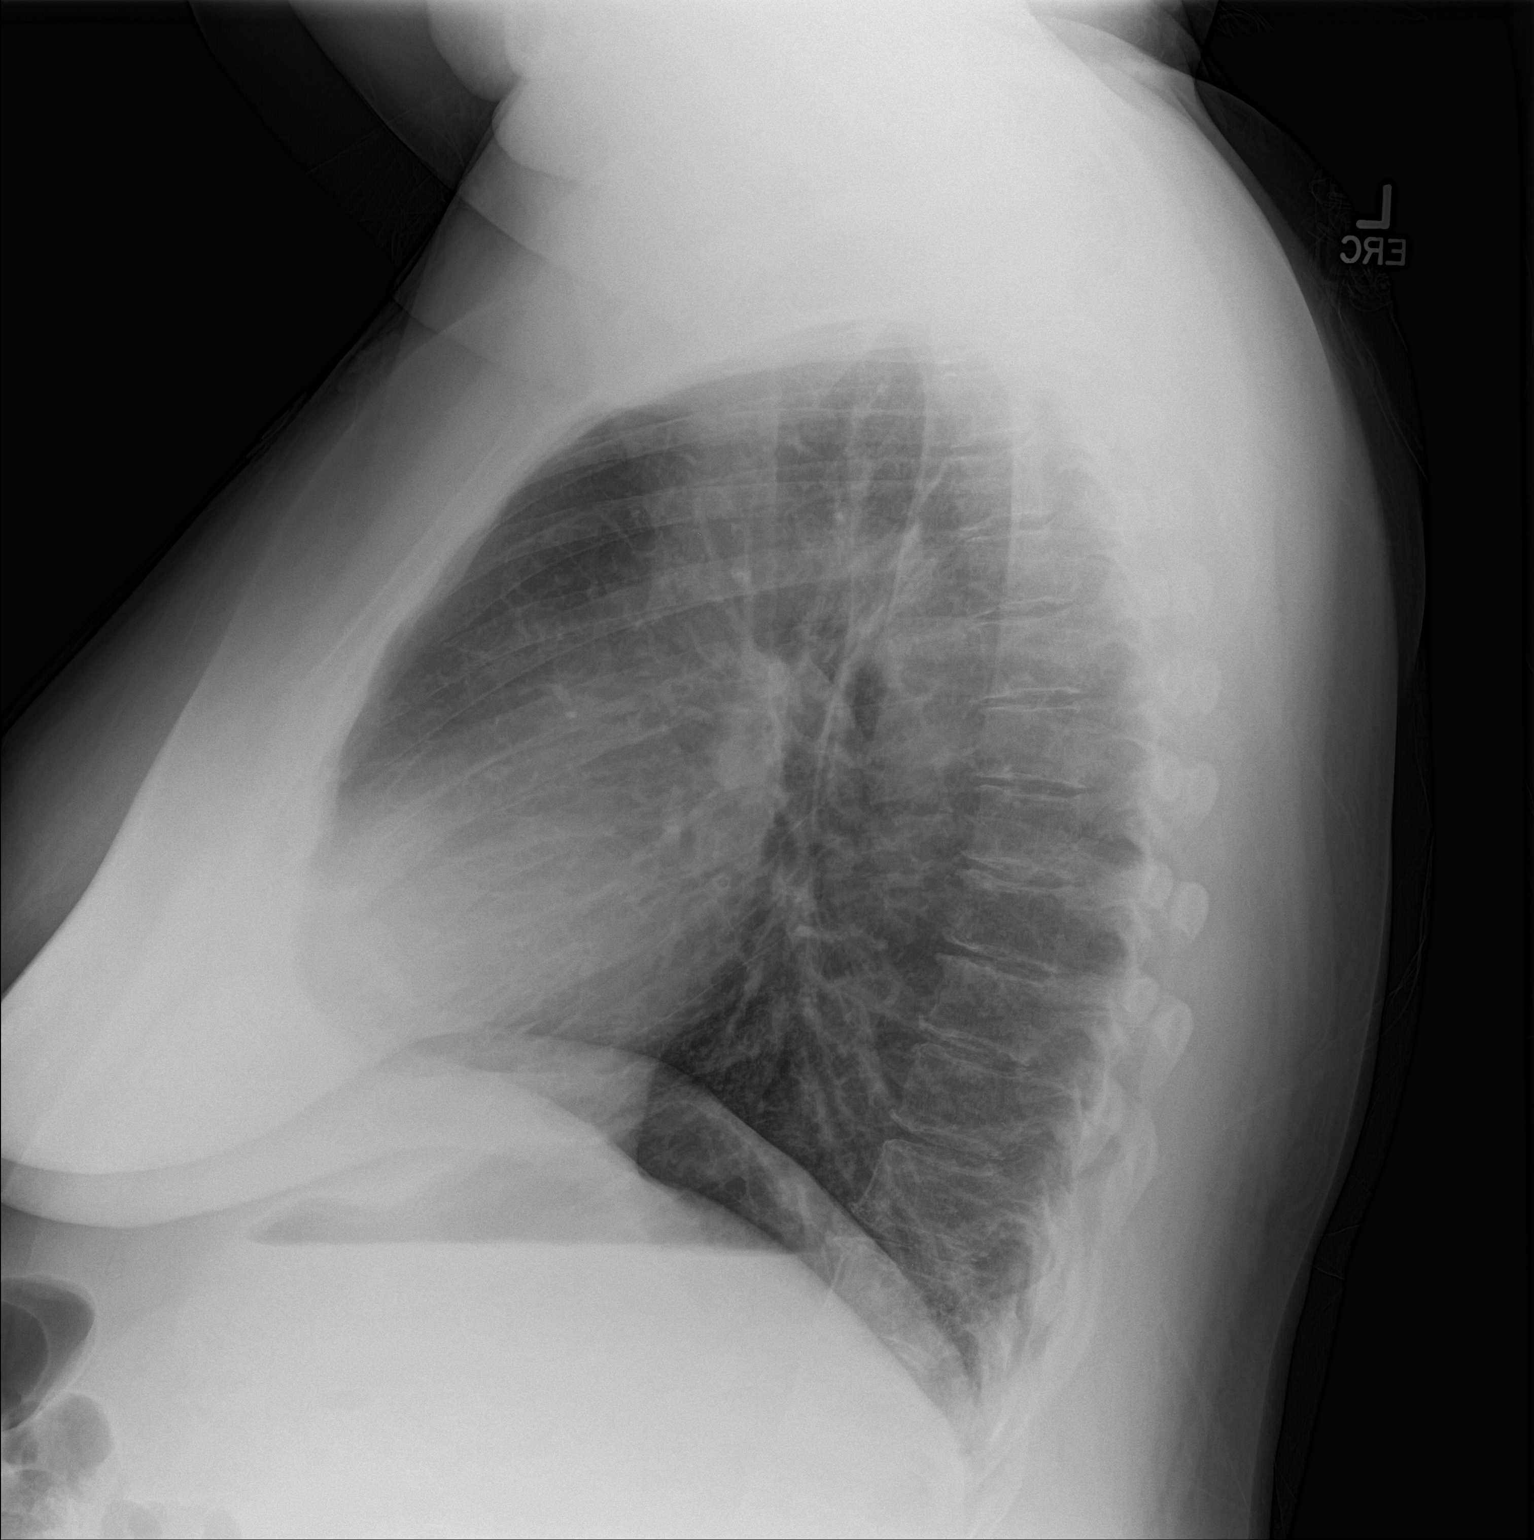

[2 of 2 positions shown; findings below may reference images not displayed]

FINDINGS: Lungs are clear.  No pleural effusion or pneumothorax.

The heart is normal in size.

Visualized osseous structures are within normal limits.
IMPRESSION: Normal chest radiographs.

## 2017-08-08 IMAGING — DX DG CHEST 1V PORT
1 series · 1 of 1 positions shown · non-contrast
Comparison: 03/23/2016

CLINICAL DATA: Shortness of Breath

EXAM:
PORTABLE CHEST 1 VIEW

[chest ap]
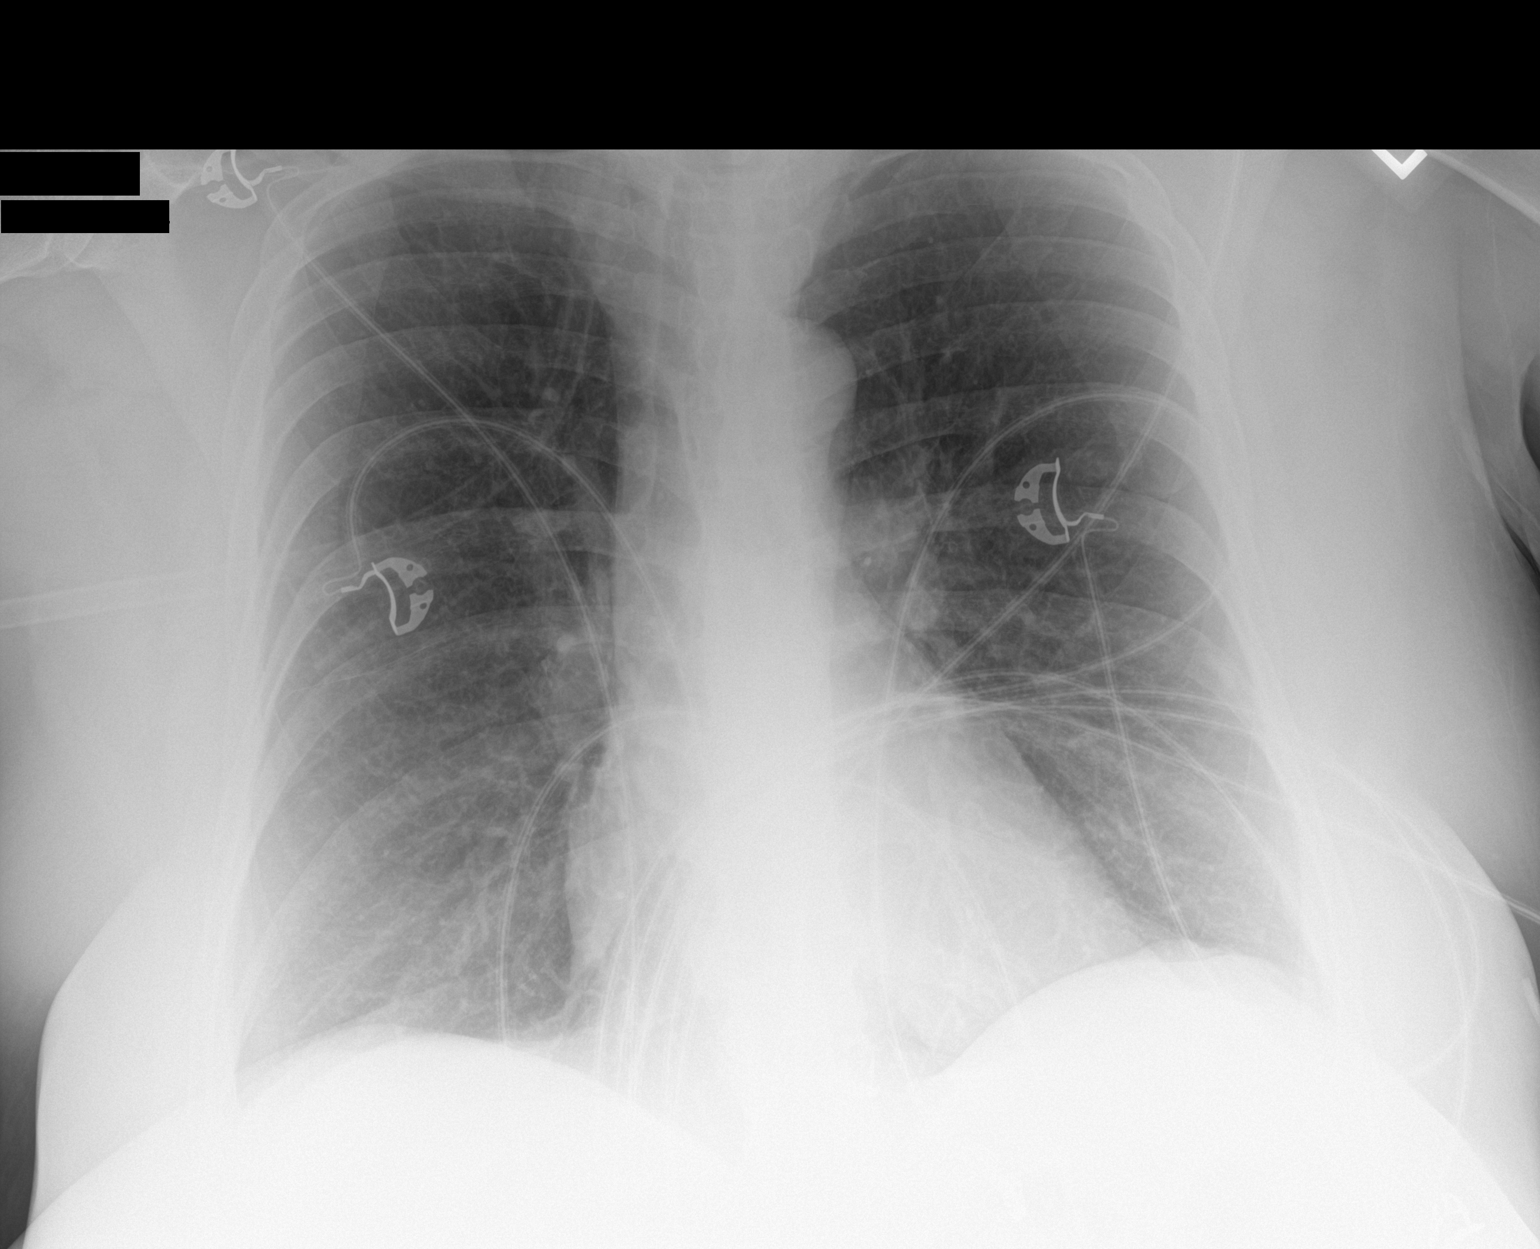

[1 of 1 positions shown; findings below may reference images not displayed]

FINDINGS: The heart size and mediastinal contours are within normal limits.
Both lungs are clear. The visualized skeletal structures are
unremarkable.
IMPRESSION: No active disease.

## 2017-09-19 ENCOUNTER — Emergency Department
Admission: EM | Admit: 2017-09-19 | Discharge: 2017-09-19 | Disposition: A | Discharge status: Home / Self Care | Payer: Medicare Other | Attending: Emergency Medicine | Admitting: Emergency Medicine

## 2017-09-19 ENCOUNTER — Encounter: Payer: Self-pay | Admitting: Emergency Medicine

## 2017-09-19 ENCOUNTER — Emergency Department: Payer: Medicare Other

## 2017-09-19 DIAGNOSIS — R062 Wheezing: Secondary | ICD-10-CM | POA: Diagnosis not present

## 2017-09-19 DIAGNOSIS — Z79899 Other long term (current) drug therapy: Secondary | ICD-10-CM | POA: Diagnosis not present

## 2017-09-19 DIAGNOSIS — I10 Essential (primary) hypertension: Secondary | ICD-10-CM | POA: Insufficient documentation

## 2017-09-19 DIAGNOSIS — J441 Chronic obstructive pulmonary disease with (acute) exacerbation: Secondary | ICD-10-CM

## 2017-09-19 DIAGNOSIS — F17210 Nicotine dependence, cigarettes, uncomplicated: Secondary | ICD-10-CM | POA: Diagnosis not present

## 2017-09-19 DIAGNOSIS — E119 Type 2 diabetes mellitus without complications: Secondary | ICD-10-CM | POA: Insufficient documentation

## 2017-09-19 DIAGNOSIS — R0602 Shortness of breath: Secondary | ICD-10-CM | POA: Diagnosis not present

## 2017-09-19 DIAGNOSIS — J45909 Unspecified asthma, uncomplicated: Secondary | ICD-10-CM | POA: Diagnosis not present

## 2017-09-19 DIAGNOSIS — Z7984 Long term (current) use of oral hypoglycemic drugs: Secondary | ICD-10-CM | POA: Insufficient documentation

## 2017-09-19 DIAGNOSIS — R0600 Dyspnea, unspecified: Secondary | ICD-10-CM | POA: Diagnosis not present

## 2017-09-19 LAB — CBC WITH DIFFERENTIAL/PLATELET
BASOS ABS: 0.1 10*3/uL (ref 0–0.1)
BASOS PCT: 1 %
EOS ABS: 0.6 10*3/uL (ref 0–0.7)
Eosinophils Relative: 6 %
HCT: 46.2 % (ref 35.0–47.0)
HEMOGLOBIN: 15.1 g/dL (ref 12.0–16.0)
Lymphocytes Relative: 44 %
Lymphs Abs: 4.4 10*3/uL — ABNORMAL HIGH (ref 1.0–3.6)
MCH: 29.2 pg (ref 26.0–34.0)
MCHC: 32.8 g/dL (ref 32.0–36.0)
MCV: 89.2 fL (ref 80.0–100.0)
Monocytes Absolute: 0.9 10*3/uL (ref 0.2–0.9)
Monocytes Relative: 9 %
NEUTROS PCT: 40 %
Neutro Abs: 3.9 10*3/uL (ref 1.4–6.5)
PLATELETS: 335 10*3/uL (ref 150–440)
RBC: 5.18 MIL/uL (ref 3.80–5.20)
RDW: 13.9 % (ref 11.5–14.5)
WBC: 9.9 10*3/uL (ref 3.6–11.0)

## 2017-09-19 LAB — COMPREHENSIVE METABOLIC PANEL
ALK PHOS: 74 U/L (ref 38–126)
ALT: 33 U/L (ref 14–54)
AST: 24 U/L (ref 15–41)
Albumin: 4.1 g/dL (ref 3.5–5.0)
Anion gap: 7 (ref 5–15)
BILIRUBIN TOTAL: 0.9 mg/dL (ref 0.3–1.2)
BUN: 26 mg/dL — ABNORMAL HIGH (ref 6–20)
CALCIUM: 9.7 mg/dL (ref 8.9–10.3)
CHLORIDE: 108 mmol/L (ref 101–111)
CO2: 26 mmol/L (ref 22–32)
CREATININE: 1.8 mg/dL — AB (ref 0.44–1.00)
GFR, EST AFRICAN AMERICAN: 36 mL/min — AB (ref >60)
GFR, EST NON AFRICAN AMERICAN: 31 mL/min — AB (ref >60)
Glucose, Bld: 120 mg/dL — ABNORMAL HIGH (ref 65–99)
Potassium: 4.6 mmol/L (ref 3.5–5.1)
Sodium: 141 mmol/L (ref 135–145)
TOTAL PROTEIN: 7.6 g/dL (ref 6.5–8.1)

## 2017-09-19 MED ORDER — SODIUM CHLORIDE 0.9 % IV BOLUS (SEPSIS)
1000.0000 mL | Freq: Once | INTRAVENOUS | Status: AC
  Administered 2017-09-19: 1000 mL via INTRAVENOUS

## 2017-09-19 MED ORDER — AZITHROMYCIN 500 MG PO TABS
500.0000 mg | ORAL_TABLET | Freq: Once | ORAL | Status: AC
  Administered 2017-09-19: 500 mg via ORAL
  Filled 2017-09-19: qty 1

## 2017-09-19 MED ORDER — AZITHROMYCIN 250 MG PO TABS: ORAL_TABLET | ORAL | 0 refills | Status: AC

## 2017-09-19 MED ORDER — MAGNESIUM SULFATE 2 GM/50ML IV SOLN
2.0000 g | Freq: Once | INTRAVENOUS | Status: AC
  Administered 2017-09-19: 2 g via INTRAVENOUS
  Filled 2017-09-19: qty 50

## 2017-09-19 MED ORDER — IPRATROPIUM-ALBUTEROL 0.5-2.5 (3) MG/3ML IN SOLN
3.0000 mL | Freq: Once | RESPIRATORY_TRACT | Status: AC
  Administered 2017-09-19: 3 mL via RESPIRATORY_TRACT
  Filled 2017-09-19: qty 6

## 2017-09-19 MED ORDER — ALBUTEROL SULFATE HFA 108 (90 BASE) MCG/ACT IN AERS: 2.0000 | INHALATION_SPRAY | Freq: Four times a day (QID) | RESPIRATORY_TRACT | 2 refills | Status: DC | PRN

## 2017-09-19 MED ORDER — IPRATROPIUM-ALBUTEROL 0.5-2.5 (3) MG/3ML IN SOLN
3.0000 mL | Freq: Once | RESPIRATORY_TRACT | Status: AC
  Administered 2017-09-19: 3 mL via RESPIRATORY_TRACT

## 2017-09-19 MED ORDER — PREDNISONE 10 MG PO TABS: 50.0000 mg | ORAL_TABLET | Freq: Every day | ORAL | 0 refills | Status: AC

## 2017-09-19 NOTE — ED Triage Notes (Signed)
Pt arrived to ED via EMS from home where EMS reports pt has been SOB x3 days with worsening symptoms this AM. Pt 97% on RA initiallyon arrival for EMS. Pt A&O x4 on arrival to ED with inspiratory and expiratory wheezing. Pt received 1 Duoneb and solumedrol in route to ED. MD at bedside for further evaluation.

## 2017-09-19 NOTE — ED Notes (Signed)
In to check on pt. Pt looks much improved with treatment., Pt states she feels much better and is asking when she will be going home. MD to bedside.

## 2017-09-19 NOTE — Discharge Instructions (Signed)
Please take all of your antibiotics and your steroids as prescribed and make an appointment to follow-up with your primary care physician in 2 days for reevaluation. Return to the emergency department sooner for any new or worsening symptoms such as fevers, chills, worsening cough, or for any other concerns.

## 2017-09-19 NOTE — ED Provider Notes (Signed)
[facility] Emergency Department Provider Note  ____________________________________________   First MD Initiated Contact with Patient 09/19/17 [phone]     (approximate)  I have reviewed the triage vital signs and the nursing notes.   HISTORY  Chief Complaint Shortness of Breath   HPI Samantha [name] is a 51 y.o. female who comes to the emergency department via EMS with a COPD exacerbation. She's had increasing shortness of breath over the past 3 days along with cough productive of green sputum. No fevers or chills. She has used albuterol at home with some relief. When EMS got the patient she was able to ambulate to the ambulance, however was winded. They gave her one DuoNeb125 mg of Solu-Medrol. They reported her feeling improved prior to arrival. She's had her shortness of breath had an insidious onset and has been slowly progressive. It is improved with albuterol and somewhat worsens with exertion. She denies chest pain.   Past Medical History:  Diagnosis Date  . Asthma   . COPD (chronic obstructive pulmonary disease) (HCC)   . Diabetes mellitus without complication (HCC)   . Hypertension     Patient Active Problem List   Diagnosis Date Noted  . Acute on chronic respiratory failure (HCC) 10/06/2015  . Acute on chronic respiratory failure with hypoxemia (HCC) 08/30/2015    Past Surgical History:  Procedure Laterality Date  . none      Prior to Admission medications   Medication Sig Start Date End Date Taking? Authorizing Provider  albuterol (PROVENTIL HFA;VENTOLIN HFA) 108 (90 Base) MCG/ACT inhaler Inhale 2 puffs into the lungs every 6 (six) hours as needed for wheezing or shortness of breath. 04/08/16   Gayla Doss, MD  albuterol (PROVENTIL HFA;VENTOLIN HFA) 108 (90 Base) MCG/ACT inhaler Inhale 2 puffs into the lungs every 6 (six) hours as needed for wheezing or shortness of breath. 09/19/17   Merrily Brittle, MD  amoxicillin-clavulanate  (AUGMENTIN) 875-125 MG tablet Take 1 tablet by mouth 2 (two) times daily. for 10 days    [provider]  azithromycin (ZITHROMAX Z-PAK) 250 MG tablet Take 2 tablets (500 mg) on  Day 1,  followed by 1 tablet (250 mg) once daily on Days 2 through 5. 09/19/17 09/24/17  Merrily Brittle, MD  budesonide-formoterol (SYMBICORT) 160-4.5 MCG/ACT inhaler Inhale 2 puffs into the lungs every 12 (twelve) hours.    [provider]  gabapentin (NEURONTIN) 300 MG capsule Take 300 mg by mouth 2 (two) times daily.    [provider]  glimepiride (AMARYL) 4 MG tablet Take 4 mg by mouth daily.    [provider]  levofloxacin (LEVAQUIN) 750 MG tablet Take 1 tablet (750 mg total) by mouth daily. Patient not taking: Reported on 08/19/2016 04/08/16   Gayla Doss, MD  losartan-hydrochlorothiazide (HYZAAR) 50-12.5 MG tablet Take 1 tablet by mouth daily. 02/22/16   [provider]  metFORMIN (GLUCOPHAGE) 850 MG tablet Take 1 tablet by mouth daily. 12/11/15   [provider]  montelukast (SINGULAIR) 10 MG tablet Take 10 mg by mouth at bedtime.     [provider]  predniSONE (DELTASONE) 10 MG tablet Take 5 tablets (50 mg total) by mouth daily. 09/19/17 09/23/17  Merrily Brittle, MD  predniSONE (DELTASONE) 20 MG tablet Take 2 tablets (40 mg total) by mouth daily. Patient not taking: Reported on 09/19/2017 06/21/17   Minna Antis, MD    Allergies Percocet [oxycodone-acetaminophen]  Family History  Problem Relation Age of Onset  .  Asthma Mother   . Asthma Sister     Social History Social History  Substance Use Topics  . Smoking status: Current Every Day Smoker    Packs/day: 1.00    Years: 23.00    Types: Cigarettes  . Smokeless tobacco: Never Used  . Alcohol use Yes    Review of Systems Constitutional: No fever/chills Eyes: No visual changes. ENT: No sore throat. Cardiovascular: Denies chest pain. Respiratory: positive for shortness of  breath. Gastrointestinal: No abdominal pain.  No nausea, no vomiting.  No diarrhea.  No constipation. Genitourinary: Negative for dysuria. Musculoskeletal: Negative for back pain. Skin: Negative for rash. Neurological: Negative for headaches, focal weakness or numbness.   ____________________________________________   PHYSICAL EXAM:  VITAL SIGNS: ED Triage Vitals [09/19/17 0554]  Enc Vitals Group     BP      Pulse      Resp      Temp      Temp src      SpO2      Weight 270 lb (122.5 kg)     Height      Head Circumference      Peak Flow      Pain Score      Pain Loc      Pain Edu?      Excl. in GC?     Constitutional: alert and oriented 4 appears short of breath speaking in short sentences Eyes: PERRL EOMI. Head: Atraumatic. Nose: No congestion/rhinnorhea. Mouth/Throat: No trismus Neck: No stridor.   Cardiovascular: tachycardicrate, regular rhythm. Grossly normal heart sounds.  Good peripheral circulation. Respiratory: increased respiratory effort diffuse expiratory wheezes in all fields although moving good air Gastrointestinal: soft nontender Musculoskeletal: No lower extremity edema   Neurologic:  . No gross focal neurologic deficits are appreciated. Skin:  Skin is warm, dry and intact. No rash noted. Psychiatric: Mood and affect are normal. Speech and behavior are normal.    ____________________________________________   DIFFERENTIAL includes but not limited to  COPD exacerbation, pneumothorax, pulmonary bolus him, lobar collapse, pneumonia ____________________________________________   LABS (all labs ordered are listed, but only abnormal results are displayed)  Labs Reviewed  COMPREHENSIVE METABOLIC PANEL - Abnormal; Notable for the following:       Result Value   Glucose, Bld 120 (*)    BUN 26 (*)    Creatinine, Ser 1.80 (*)    GFR calc non Af Amer 31 (*)    GFR calc Af Amer 36 (*)    All other components within normal limits  CBC WITH  DIFFERENTIAL/PLATELET - Abnormal; Notable for the following:    Lymphs Abs 4.4 (*)    All other components within normal limits    blood work reviewed and interpreted by me shows chronic kidney disease but no acute disease __________________________________________  EKG  ED ECG REPORT I, Merrily Brittle, the attending physician, personally viewed and interpreted this ECG.  Date: 09/19/2017 EKG Time:  Rate: 93 Rhythm: normal sinus rhythm QRS Axis: normal Intervals: normal ST/T Wave abnormalities: normal Narrative Interpretation: no evidence of acute ischemia  ____________________________________________  RADIOLOGY  chest x-ray reviewed by me shows no acute disease ____________________________________________   PROCEDURES  Procedure(s) performed: no  Procedures  Critical Care performed: no  Observation: no ____________________________________________   INITIAL IMPRESSION / ASSESSMENT AND PLAN / ED COURSE  Pertinent labs & imaging results that were available during my care of the patient were reviewed by me and considered in my medical decision making (see chart  for details).  the patient arrives short of breath with wheezy lungs. Concern is for COPD exacerbation. Chest x-ray will be obtained to evaluate for pneumonia versus lobar collapse versus pneumothorax etc. 2 more breathing treatments along with fluids for dehydration and magnesium sulfate are pending    ----------------------------------------- 6:36 AM on 09/19/2017 -----------------------------------------  The patient feels improved and is saturating 96% on room air and her lungs are nearly clear. She is asking to go home. She does need a refill of her albuterol and I will give her more more days of steroids along with azithromycin. Strict return precautions were given and the patient verbalizes understanding and agreement with the plan. ____________________________________________   FINAL CLINICAL  IMPRESSION(S) / ED DIAGNOSES  Final diagnoses:  COPD exacerbation (HCC)      NEW MEDICATIONS STARTED DURING THIS VISIT:  New Prescriptions   ALBUTEROL (PROVENTIL HFA;VENTOLIN HFA) 108 (90 BASE) MCG/ACT INHALER    Inhale 2 puffs into the lungs every 6 (six) hours as needed for wheezing or shortness of breath.   AZITHROMYCIN (ZITHROMAX Z-PAK) 250 MG TABLET    Take 2 tablets (500 mg) on  Day 1,  followed by 1 tablet (250 mg) once daily on Days 2 through 5.   PREDNISONE (DELTASONE) 10 MG TABLET    Take 5 tablets (50 mg total) by mouth daily.     Note:  This document was prepared using Dragon voice recognition software and may include unintentional dictation errors.     Merrily Brittle, MD 09/19/17 [phone]

## 2017-09-30 ENCOUNTER — Emergency Department: Payer: Medicare Other

## 2017-09-30 ENCOUNTER — Encounter: Payer: Self-pay | Admitting: Emergency Medicine

## 2017-09-30 ENCOUNTER — Emergency Department
Admission: EM | Admit: 2017-09-30 | Discharge: 2017-09-30 | Disposition: A | Discharge status: Home / Self Care | Payer: Medicare Other | Attending: Emergency Medicine | Admitting: Emergency Medicine

## 2017-09-30 DIAGNOSIS — Z79899 Other long term (current) drug therapy: Secondary | ICD-10-CM | POA: Diagnosis not present

## 2017-09-30 DIAGNOSIS — Z7984 Long term (current) use of oral hypoglycemic drugs: Secondary | ICD-10-CM | POA: Diagnosis not present

## 2017-09-30 DIAGNOSIS — F17210 Nicotine dependence, cigarettes, uncomplicated: Secondary | ICD-10-CM | POA: Diagnosis not present

## 2017-09-30 DIAGNOSIS — J441 Chronic obstructive pulmonary disease with (acute) exacerbation: Secondary | ICD-10-CM | POA: Diagnosis not present

## 2017-09-30 DIAGNOSIS — R0602 Shortness of breath: Secondary | ICD-10-CM | POA: Diagnosis not present

## 2017-09-30 DIAGNOSIS — J45909 Unspecified asthma, uncomplicated: Secondary | ICD-10-CM | POA: Insufficient documentation

## 2017-09-30 DIAGNOSIS — E119 Type 2 diabetes mellitus without complications: Secondary | ICD-10-CM | POA: Diagnosis not present

## 2017-09-30 DIAGNOSIS — I10 Essential (primary) hypertension: Secondary | ICD-10-CM | POA: Diagnosis not present

## 2017-09-30 DIAGNOSIS — R0600 Dyspnea, unspecified: Secondary | ICD-10-CM | POA: Diagnosis present

## 2017-09-30 LAB — BASIC METABOLIC PANEL
ANION GAP: 7 (ref 5–15)
BUN: 20 mg/dL (ref 6–20)
CO2: 23 mmol/L (ref 22–32)
Calcium: 9.2 mg/dL (ref 8.9–10.3)
Chloride: 107 mmol/L (ref 101–111)
Creatinine, Ser: 1.44 mg/dL — ABNORMAL HIGH (ref 0.44–1.00)
GFR calc Af Amer: 47 mL/min — ABNORMAL LOW (ref >60)
GFR calc non Af Amer: 41 mL/min — ABNORMAL LOW (ref >60)
GLUCOSE: 102 mg/dL — AB (ref 65–99)
POTASSIUM: 4.3 mmol/L (ref 3.5–5.1)
SODIUM: 137 mmol/L (ref 135–145)

## 2017-09-30 LAB — CBC WITH DIFFERENTIAL/PLATELET
BASOS ABS: 0.1 10*3/uL (ref 0–0.1)
Basophils Relative: 1 %
Eosinophils Absolute: 0.5 10*3/uL (ref 0–0.7)
Eosinophils Relative: 5 %
HEMATOCRIT: 45.5 % (ref 35.0–47.0)
Hemoglobin: 14.9 g/dL (ref 12.0–16.0)
LYMPHS PCT: 41 %
Lymphs Abs: 4 10*3/uL — ABNORMAL HIGH (ref 1.0–3.6)
MCH: 28.9 pg (ref 26.0–34.0)
MCHC: 32.8 g/dL (ref 32.0–36.0)
MCV: 88 fL (ref 80.0–100.0)
Monocytes Absolute: 0.5 10*3/uL (ref 0.2–0.9)
Monocytes Relative: 6 %
NEUTROS ABS: 4.6 10*3/uL (ref 1.4–6.5)
Neutrophils Relative %: 47 %
Platelets: 349 10*3/uL (ref 150–440)
RBC: 5.18 MIL/uL (ref 3.80–5.20)
RDW: 13.8 % (ref 11.5–14.5)
WBC: 9.8 10*3/uL (ref 3.6–11.0)

## 2017-09-30 LAB — BRAIN NATRIURETIC PEPTIDE: B Natriuretic Peptide: 16 pg/mL (ref 0.0–100.0)

## 2017-09-30 LAB — TROPONIN I: Troponin I: <0.03 ng/mL (ref <0.03)

## 2017-09-30 MED ORDER — MAGNESIUM SULFATE 2 GM/50ML IV SOLN
2.0000 g | Freq: Once | INTRAVENOUS | Status: AC
  Administered 2017-09-30: 2 g via INTRAVENOUS
  Filled 2017-09-30: qty 50

## 2017-09-30 MED ORDER — ALBUTEROL SULFATE (2.5 MG/3ML) 0.083% IN NEBU
INHALATION_SOLUTION | RESPIRATORY_TRACT | Status: DC
Start: 2017-09-30 — End: 2017-09-30
  Filled 2017-09-30: qty 9

## 2017-09-30 MED ORDER — ALBUTEROL SULFATE (2.5 MG/3ML) 0.083% IN NEBU
7.5000 mg/h | INHALATION_SOLUTION | Freq: Once | RESPIRATORY_TRACT | Status: AC
  Administered 2017-09-30: 7.5 mg/h via RESPIRATORY_TRACT

## 2017-09-30 MED ORDER — PREDNISONE 10 MG PO TABS: ORAL_TABLET | ORAL | 0 refills | Status: DC

## 2017-09-30 NOTE — ED Provider Notes (Signed)
[facility] Emergency Department Provider Note ____________________________________________   I have reviewed the triage vital signs and the triage nursing note.  HISTORY  Chief Complaint Respiratory Distress   Historian Patient  HPI Samantha [name] is a 51 y.o. female arrived by EMS for respiratory distress.  She has a history of asthma and COPD as well as diabetes and hypertension and states that breathing got worse yesterday and had tried multiple breathing treatments overnight.  When EMS arrived room air sat was not obtained the patient was placed on oxygen.  Patient states that she generally wears 2 L at night.  She was given Solu-Medrol and 2 DuoNeb prior to arrival and did have some improvement although she is still extremely wheezy.  Symptoms are moderate to severe.  Nothing makes it worse or better.  Denies coughing or fevers.   Past Medical History:  Diagnosis Date  . Asthma   . COPD (chronic obstructive pulmonary disease) (HCC)   . Diabetes mellitus without complication (HCC)   . Hypertension     Patient Active Problem List   Diagnosis Date Noted  . Acute on chronic respiratory failure (HCC) 10/06/2015  . Acute on chronic respiratory failure with hypoxemia (HCC) 08/30/2015    Past Surgical History:  Procedure Laterality Date  . none      Prior to Admission medications   Medication Sig Start Date End Date Taking? Authorizing Provider  albuterol (PROVENTIL HFA;VENTOLIN HFA) 108 (90 Base) MCG/ACT inhaler Inhale 2 puffs into the lungs every 6 (six) hours as needed for wheezing or shortness of breath. 04/08/16   Gayla Doss, MD  albuterol (PROVENTIL HFA;VENTOLIN HFA) 108 (90 Base) MCG/ACT inhaler Inhale 2 puffs into the lungs every 6 (six) hours as needed for wheezing or shortness of breath. 09/19/17   Merrily Brittle, MD  amoxicillin-clavulanate (AUGMENTIN) 875-125 MG tablet Take 1 tablet by mouth 2 (two) times daily. for 10 days     [provider]  budesonide-formoterol (SYMBICORT) 160-4.5 MCG/ACT inhaler Inhale 2 puffs into the lungs every 12 (twelve) hours.    [provider]  gabapentin (NEURONTIN) 300 MG capsule Take 300 mg by mouth 2 (two) times daily.    [provider]  glimepiride (AMARYL) 4 MG tablet Take 4 mg by mouth daily.    [provider]  levofloxacin (LEVAQUIN) 750 MG tablet Take 1 tablet (750 mg total) by mouth daily. Patient not taking: Reported on 08/19/2016 04/08/16   Gayla Doss, MD  losartan-hydrochlorothiazide (HYZAAR) 50-12.5 MG tablet Take 1 tablet by mouth daily. 02/22/16   [provider]  metFORMIN (GLUCOPHAGE) 850 MG tablet Take 1 tablet by mouth daily. 12/11/15   [provider]  montelukast (SINGULAIR) 10 MG tablet Take 10 mg by mouth at bedtime.     [provider]  predniSONE (DELTASONE) 10 MG tablet 50 mg daily for 4 more days 09/30/17   Governor Rooks, MD    Allergies  Allergen Reactions  . Percocet [Oxycodone-Acetaminophen] Itching    Family History  Problem Relation Age of Onset  . Asthma Mother   . Asthma Sister     Social History Social History  Substance Use Topics  . Smoking status: Current Every Day Smoker    Packs/day: 0.50    Years: 23.00    Types: Cigarettes  . Smokeless tobacco: Never Used  . Alcohol use Yes    Review of Systems  Constitutional: Negative for fever. Eyes: Negative for visual changes. ENT: Negative for sore  throat. Cardiovascular: Negative for chest pain. Respiratory: Negative for shortness of breath. Gastrointestinal: Negative for abdominal pain, vomiting and diarrhea. Genitourinary: Negative for dysuria. Musculoskeletal: Negative for back pain. Skin: Negative for rash. Neurological: Negative for headache.  ____________________________________________   PHYSICAL EXAM:  VITAL SIGNS: ED Triage Vitals  Enc Vitals Group     BP 09/30/17 0915 (!) 144/101     Pulse Rate  09/30/17 0915 95     Resp 09/30/17 0915 (!) 24     Temp 09/30/17 0915 98.6 F (37 C)     Temp Source 09/30/17 0915 Oral     SpO2 09/30/17 0915 100 %     Weight 09/30/17 0916 261 lb (118.4 kg)     Height 09/30/17 0916 5\' 7"  (1.702 m)     Head Circumference --      Peak Flow --      Pain Score --      Pain Loc --      Pain Edu? --      Excl. in GC? --      Constitutional: Alert and oriented.  Moderate respiratory distress with tachypnea and wheezing and tripoding. HEENT   Head: Normocephalic and atraumatic.      Eyes: Conjunctivae are normal. Pupils equal and round.       Ears:         Nose: No congestion/rhinnorhea.   Mouth/Throat: Mucous membranes are moist.   Neck: No stridor. Cardiovascular/Chest: Normal rate, regular rhythm.  No murmurs, rubs, or gallops. Respiratory: Tachypneic with suprasternal retractions.  Occasional tripoding.  Wheezing throughout all fields.  Mild decreased air movement.  Moderate rhonchi, no rales. Gastrointestinal: Soft. No distention, no guarding, no rebound. Nontender.  Obese Genitourinary/rectal:Deferred Musculoskeletal: Nontender with normal range of motion in all extremities. No joint effusions.  No lower extremity tenderness.  No edema. Neurologic:  Normal speech and language. No gross or focal neurologic deficits are appreciated. Skin:  Skin is warm, dry and intact. No rash noted. Psychiatric: Mood and affect are normal. Speech and behavior are normal. Patient exhibits appropriate insight and judgment.   ____________________________________________  LABS (pertinent positives/negatives) I, Governor Rooksebecca Alex Leahy, MD the attending physician have reviewed the labs noted below.  Labs Reviewed  BASIC METABOLIC PANEL - Abnormal; Notable for the following:       Result Value   Glucose, Bld 102 (*)    Creatinine, Ser 1.44 (*)    GFR calc non Af Amer 41 (*)    GFR calc Af Amer 47 (*)    All other components within normal limits  CBC WITH  DIFFERENTIAL/PLATELET - Abnormal; Notable for the following:    Lymphs Abs 4.0 (*)    All other components within normal limits  TROPONIN I  BRAIN NATRIURETIC PEPTIDE    ____________________________________________    EKG I, Governor Rooksebecca Sheran Newstrom, MD, the attending physician have personally viewed and interpreted all ECGs.  92 bpm.  Normal sinus rhythm.  There are crisp renal axis.  Normal ST and T wave. ____________________________________________  RADIOLOGY All Xrays were viewed by me.  Imaging interpreted by Radiologist, and I, Governor Rooksebecca Japleen Tornow, MD the attending physician have reviewed the radiologist interpretation noted below.  Chest x-ray portable:  IMPRESSION: No active disease. __________________________________________  PROCEDURES  Procedure(s) performed: None  Critical Care performed: None  ____________________________________________  No current facility-administered medications on file prior to encounter.    Current Outpatient Prescriptions on File Prior to Encounter  Medication Sig Dispense Refill  . albuterol (PROVENTIL HFA;VENTOLIN HFA) 108 (  90 Base) MCG/ACT inhaler Inhale 2 puffs into the lungs every 6 (six) hours as needed for wheezing or shortness of breath. 1 Inhaler 0  . albuterol (PROVENTIL HFA;VENTOLIN HFA) 108 (90 Base) MCG/ACT inhaler Inhale 2 puffs into the lungs every 6 (six) hours as needed for wheezing or shortness of breath. 1 Inhaler 2  . amoxicillin-clavulanate (AUGMENTIN) 875-125 MG tablet Take 1 tablet by mouth 2 (two) times daily. for 10 days    . budesonide-formoterol (SYMBICORT) 160-4.5 MCG/ACT inhaler Inhale 2 puffs into the lungs every 12 (twelve) hours.    . gabapentin (NEURONTIN) 300 MG capsule Take 300 mg by mouth 2 (two) times daily.  0  . glimepiride (AMARYL) 4 MG tablet Take 4 mg by mouth daily.    Marland Kitchen levofloxacin (LEVAQUIN) 750 MG tablet Take 1 tablet (750 mg total) by mouth daily. (Patient not taking: Reported on 08/19/2016) 5 tablet 0  .  losartan-hydrochlorothiazide (HYZAAR) 50-12.5 MG tablet Take 1 tablet by mouth daily.  0  . metFORMIN (GLUCOPHAGE) 850 MG tablet Take 1 tablet by mouth daily.  0  . montelukast (SINGULAIR) 10 MG tablet Take 10 mg by mouth at bedtime.   0    ____________________________________________  ED COURSE / ASSESSMENT AND PLAN  Pertinent labs & imaging results that were available during my care of the patient were reviewed by me and considered in my medical decision making (see chart for details).    Patient presented with a fair amount of respiratory distress without hypoxia and was given an hour continuous neb treatment plus magnesium because the patient states that what really helped her last time.  The rest of her evaluation and exam is reassuring in terms of no evidence for ACS, CHF, or PE clinically.  Initially patient looked bad enough that I had indicated to her that she might need to be admitted and she stated no she was getting at home.  After treatments, around 1045 patient was asking the nurse to be discharged home.  I did go reevaluate her and indeed her breathing is much improved essentially resolved and she wants to go home now.  I let her know that I would prefer to watch her for several more hours to make sure that patient does not rebound, but patient states that she needs to leave now in order to care for her elderly mother and understands about returning for any worsening.  I will provide a prescription for prednisone burst.  DIFFERENTIAL DIAGNOSIS: Differential includes, but is not limited to, viral syndrome, bronchitis including COPD exacerbation, pneumonia, reactive airway disease including asthma, CHF including exacerbation with or without pulmonary/interstitial edema, pneumothorax, ACS, thoracic trauma, and pulmonary embolism. CONSULTATIONS:   None   Patient / Family / Caregiver informed of clinical course, medical decision-making process, and agree with plan.   I discussed  return precautions, follow-up instructions, and discharge instructions with patient and/or family.  Discharge Instructions :You are evaluated for wheezing and trouble breathing with asthma and COPD and given treatment in the emergency department.  I recommended that you stay for several more hours for monitoring observation, but you chose to go ahead discharge from the emergency department now.  Return to the emerge department immediately for any worsening condition, trouble breathing, chest pain, fevers, altered mental status, or any other symptoms concerning to you. ___________________________________________   FINAL CLINICAL IMPRESSION(S) / ED DIAGNOSES   Final diagnoses:  COPD exacerbation (HCC)              Note:  This dictation was prepared with Dragon dictation. Any transcriptional errors that result from this process are unintentional    Governor Rooks, MD 09/30/17 1049

## 2017-09-30 NOTE — Discharge Instructions (Signed)
You are evaluated for wheezing and trouble breathing with asthma and COPD and given treatment in the emergency department.  I recommended that you stay for several more hours for monitoring observation, but you chose to go ahead discharge from the emergency department now.  Return to the emerge department immediately for any worsening condition, trouble breathing, chest pain, fevers, altered mental status, or any other symptoms concerning to you.

## 2017-09-30 NOTE — ED Triage Notes (Signed)
Pt in via [facility] from home with complaints of worsening shortness of breath x 2 days, using nebulizers at home without any relief.  Pt receiving duoneb upon arrival, 125mg  solumedrol given as well via EMS.  Pt labored w/ wheezes throughout.  ED MD to bedside upon arival.

## 2017-10-09 DIAGNOSIS — K439 Ventral hernia without obstruction or gangrene: Secondary | ICD-10-CM | POA: Diagnosis not present

## 2017-10-09 DIAGNOSIS — J449 Chronic obstructive pulmonary disease, unspecified: Secondary | ICD-10-CM | POA: Diagnosis not present

## 2017-10-09 DIAGNOSIS — F17200 Nicotine dependence, unspecified, uncomplicated: Secondary | ICD-10-CM | POA: Diagnosis not present

## 2017-10-09 DIAGNOSIS — I10 Essential (primary) hypertension: Secondary | ICD-10-CM | POA: Diagnosis not present

## 2017-10-09 DIAGNOSIS — E119 Type 2 diabetes mellitus without complications: Secondary | ICD-10-CM | POA: Diagnosis not present

## 2017-10-09 DIAGNOSIS — Z1389 Encounter for screening for other disorder: Secondary | ICD-10-CM | POA: Diagnosis not present

## 2017-10-12 ENCOUNTER — Encounter: Payer: Self-pay | Admitting: *Deleted

## 2017-10-20 ENCOUNTER — Encounter: Payer: Self-pay | Admitting: Emergency Medicine

## 2017-10-20 ENCOUNTER — Emergency Department
Admission: EM | Admit: 2017-10-20 | Discharge: 2017-10-20 | Disposition: A | Discharge status: Home / Self Care | Payer: Medicare Other | Attending: Emergency Medicine | Admitting: Emergency Medicine

## 2017-10-20 ENCOUNTER — Emergency Department: Payer: Medicare Other

## 2017-10-20 ENCOUNTER — Other Ambulatory Visit: Payer: Self-pay

## 2017-10-20 DIAGNOSIS — Z7984 Long term (current) use of oral hypoglycemic drugs: Secondary | ICD-10-CM | POA: Diagnosis not present

## 2017-10-20 DIAGNOSIS — Z79899 Other long term (current) drug therapy: Secondary | ICD-10-CM | POA: Insufficient documentation

## 2017-10-20 DIAGNOSIS — I10 Essential (primary) hypertension: Secondary | ICD-10-CM | POA: Insufficient documentation

## 2017-10-20 DIAGNOSIS — J45909 Unspecified asthma, uncomplicated: Secondary | ICD-10-CM | POA: Insufficient documentation

## 2017-10-20 DIAGNOSIS — E119 Type 2 diabetes mellitus without complications: Secondary | ICD-10-CM | POA: Diagnosis not present

## 2017-10-20 DIAGNOSIS — R0602 Shortness of breath: Secondary | ICD-10-CM | POA: Diagnosis not present

## 2017-10-20 DIAGNOSIS — J441 Chronic obstructive pulmonary disease with (acute) exacerbation: Secondary | ICD-10-CM | POA: Insufficient documentation

## 2017-10-20 DIAGNOSIS — F17210 Nicotine dependence, cigarettes, uncomplicated: Secondary | ICD-10-CM | POA: Insufficient documentation

## 2017-10-20 DIAGNOSIS — R062 Wheezing: Secondary | ICD-10-CM | POA: Diagnosis not present

## 2017-10-20 LAB — CBC WITH DIFFERENTIAL/PLATELET
Basophils Absolute: 0 10*3/uL (ref 0–0.1)
Basophils Relative: 1 %
EOS PCT: 5 %
Eosinophils Absolute: 0.4 10*3/uL (ref 0–0.7)
HCT: 43.9 % (ref 35.0–47.0)
Hemoglobin: 14.3 g/dL (ref 12.0–16.0)
Lymphocytes Relative: 39 %
Lymphs Abs: 3.3 10*3/uL (ref 1.0–3.6)
MCH: 29.1 pg (ref 26.0–34.0)
MCHC: 32.6 g/dL (ref 32.0–36.0)
MCV: 89.3 fL (ref 80.0–100.0)
Monocytes Absolute: 0.6 10*3/uL (ref 0.2–0.9)
Monocytes Relative: 7 %
NEUTROS PCT: 48 %
Neutro Abs: 4.1 10*3/uL (ref 1.4–6.5)
PLATELETS: 297 10*3/uL (ref 150–440)
RBC: 4.92 MIL/uL (ref 3.80–5.20)
RDW: 14 % (ref 11.5–14.5)
WBC: 8.4 10*3/uL (ref 3.6–11.0)

## 2017-10-20 LAB — TROPONIN I: Troponin I: <0.03 ng/mL (ref <0.03)

## 2017-10-20 LAB — BASIC METABOLIC PANEL
Anion gap: 8 (ref 5–15)
BUN: 19 mg/dL (ref 6–20)
CALCIUM: 9.4 mg/dL (ref 8.9–10.3)
CO2: 23 mmol/L (ref 22–32)
CREATININE: 1.59 mg/dL — AB (ref 0.44–1.00)
Chloride: 106 mmol/L (ref 101–111)
GFR calc Af Amer: 42 mL/min — ABNORMAL LOW (ref >60)
GFR, EST NON AFRICAN AMERICAN: 36 mL/min — AB (ref >60)
GLUCOSE: 119 mg/dL — AB (ref 65–99)
POTASSIUM: 3.6 mmol/L (ref 3.5–5.1)
Sodium: 137 mmol/L (ref 135–145)

## 2017-10-20 MED ORDER — AZITHROMYCIN 500 MG PO TABS
500.0000 mg | ORAL_TABLET | Freq: Once | ORAL | Status: AC
  Administered 2017-10-20: 500 mg via ORAL
  Filled 2017-10-20: qty 1

## 2017-10-20 MED ORDER — AZITHROMYCIN 250 MG PO TABS: ORAL_TABLET | ORAL | 0 refills | Status: AC

## 2017-10-20 MED ORDER — IPRATROPIUM-ALBUTEROL 0.5-2.5 (3) MG/3ML IN SOLN
RESPIRATORY_TRACT | Status: AC
  Filled 2017-10-20: qty 6

## 2017-10-20 MED ORDER — PREDNISONE 50 MG PO TABS: ORAL_TABLET | ORAL | 0 refills | Status: DC

## 2017-10-20 MED ORDER — IPRATROPIUM-ALBUTEROL 0.5-2.5 (3) MG/3ML IN SOLN
6.0000 mL | Freq: Once | RESPIRATORY_TRACT | Status: AC
  Administered 2017-10-20: 6 mL via RESPIRATORY_TRACT

## 2017-10-20 MED ORDER — MAGNESIUM SULFATE 2 GM/50ML IV SOLN
2.0000 g | Freq: Once | INTRAVENOUS | Status: AC
  Administered 2017-10-20: 2 g via INTRAVENOUS
  Filled 2017-10-20: qty 50

## 2017-10-20 MED ORDER — M.V.I. ADULT IV INJ: INJECTION | Freq: Once | INTRAVENOUS | Status: DC

## 2017-10-20 NOTE — ED Notes (Signed)
Pt discharged home after verbalizing understanding of discharge instructions; nad noted. 

## 2017-10-20 NOTE — ED Triage Notes (Signed)
Pt via ems from home with copd exacerbation that has been building x 2 days. EMS gave her albuertol, duoneb, 125 solumedrol. Pt sat in 90's after administration of nebulizer treatments. Pt did 3 x breathing treatments at home before calling ems. Pt alert & oriented, insists she must be well enough to go home and take care of her mother, who has alzheimer's.

## 2017-10-20 NOTE — ED Provider Notes (Signed)
[facility] Emergency Department Provider Note  ____________________________________________   First MD Initiated Contact with Patient 10/20/17 [phone]     (approximate)  I have reviewed the triage vital signs and the nursing notes.   HISTORY  Chief Complaint Shortness of Breath   HPI Samantha [name] is a 51 y.o. female with a history of COPD, diabetes and hypertension was presented to the emergency department with 3-4 days of worsening shortness of breath.  The patient is not reporting any pain.  Denies any fever or body aches.  She says that she has been intubated before for her COPD.  Says that she still smokes occasionally and uses oxygen as needed at night.  However, she says that she has not been using her oxygen lately.  She says that every time that it remains heavily she has an exacerbation of her COPD.  Medics in route found her to be ordered on hypoxic to the low 90s.  She was given a DuoNeb as well as an albuterol neb and 125 mg of Solu-Medrol.  The patient reports that she usually gets magnesium which helps her breathing greatly.    Past Medical History:  Diagnosis Date  . Asthma   . COPD (chronic obstructive pulmonary disease) (HCC)   . Diabetes mellitus without complication (HCC)   . Hypertension     Patient Active Problem List   Diagnosis Date Noted  . Acute on chronic respiratory failure (HCC) 10/06/2015  . Acute on chronic respiratory failure with hypoxemia (HCC) 08/30/2015    Past Surgical History:  Procedure Laterality Date  . none      Prior to Admission medications   Medication Sig Start Date End Date Taking? Authorizing Provider  albuterol (PROVENTIL HFA;VENTOLIN HFA) 108 (90 Base) MCG/ACT inhaler Inhale 2 puffs into the lungs every 6 (six) hours as needed for wheezing or shortness of breath. 04/08/16   Gayla Doss, MD  albuterol (PROVENTIL HFA;VENTOLIN HFA) 108 (90 Base) MCG/ACT inhaler Inhale 2 puffs into the lungs every 6  (six) hours as needed for wheezing or shortness of breath. 09/19/17   Merrily Brittle, MD  amoxicillin-clavulanate (AUGMENTIN) 875-125 MG tablet Take 1 tablet by mouth 2 (two) times daily. for 10 days    [provider]  budesonide-formoterol (SYMBICORT) 160-4.5 MCG/ACT inhaler Inhale 2 puffs into the lungs every 12 (twelve) hours.    [provider]  gabapentin (NEURONTIN) 300 MG capsule Take 300 mg by mouth 2 (two) times daily.    [provider]  glimepiride (AMARYL) 4 MG tablet Take 4 mg by mouth daily.    [provider]  levofloxacin (LEVAQUIN) 750 MG tablet Take 1 tablet (750 mg total) by mouth daily. Patient not taking: Reported on 08/19/2016 04/08/16   Gayla Doss, MD  losartan-hydrochlorothiazide (HYZAAR) 50-12.5 MG tablet Take 1 tablet by mouth daily. 02/22/16   [provider]  metFORMIN (GLUCOPHAGE) 850 MG tablet Take 1 tablet by mouth daily. 12/11/15   [provider]  montelukast (SINGULAIR) 10 MG tablet Take 10 mg by mouth at bedtime.     [provider]  predniSONE (DELTASONE) 10 MG tablet 50 mg daily for 4 more days 09/30/17   Governor Rooks, MD    Allergies Percocet [oxycodone-acetaminophen]  Family History  Problem Relation Age of Onset  . Asthma Mother   . Asthma Sister     Social History Social History   Tobacco Use  . Smoking status: Current Every Day Smoker  Packs/day: 0.50    Years: 23.00    Pack years: 11.50    Types: Cigarettes  . Smokeless tobacco: Never Used  Substance Use Topics  . Alcohol use: Yes  . Drug use: No    Review of Systems  Constitutional: No fever/chills Eyes: No visual changes. ENT: No sore throat. Cardiovascular: Denies chest pain. Respiratory: As above Gastrointestinal: No abdominal pain.  No nausea, no vomiting.  No diarrhea.  No constipation. Genitourinary: Negative for dysuria. Musculoskeletal: Negative for back pain. Skin: Negative for rash. Neurological:  Negative for headaches, focal weakness or numbness.   ____________________________________________   PHYSICAL EXAM:  VITAL SIGNS: ED Triage Vitals  Enc Vitals Group     BP 10/20/17 0729 (!) 165/102     Pulse Rate 10/20/17 0729 90     Resp 10/20/17 0729 19     Temp 10/20/17 0729 98.2 F (36.8 C)     Temp Source 10/20/17 0729 Oral     SpO2 10/20/17 0729 95 %     Weight 10/20/17 0730 261 lb (118.4 kg)     Height 10/20/17 0730 5\' 7"  (1.702 m)     Head Circumference --      Peak Flow --      Pain Score 10/20/17 0729 0     Pain Loc --      Pain Edu? --      Excl. in GC? --     Constitutional: Alert and oriented.  In no acute distress. Eyes: Conjunctivae are normal.  Head: Atraumatic. Nose: No congestion/rhinnorhea. Mouth/Throat: Mucous membranes are moist.  Neck: No stridor.   Cardiovascular: Normal rate, regular rhythm. Grossly normal heart sounds.  Respiratory: Patient using accessory muscles to breathe but able to speak in full sentences.  Tachypneic.  Lungs with wheezing throughout all fields with a prolonged expiratory phase and decreased air movement. Gastrointestinal: Soft and nontender. No distention. No CVA tenderness. Musculoskeletal: No lower extremity tenderness nor edema.  No joint effusions. Neurologic:  Normal speech and language. No gross focal neurologic deficits are appreciated. Skin:  Skin is warm, dry and intact. No rash noted. Psychiatric: Mood and affect are normal. Speech and behavior are normal.  ____________________________________________   LABS (all labs ordered are listed, but only abnormal results are displayed)  Labs Reviewed  BASIC METABOLIC PANEL - Abnormal; Notable for the following components:      Result Value   Glucose, Bld 119 (*)    Creatinine, Ser 1.59 (*)    GFR calc non Af Amer 36 (*)    GFR calc Af Amer 42 (*)    All other components within normal limits  CBC WITH DIFFERENTIAL/PLATELET  TROPONIN I    ____________________________________________  EKG  ED ECG REPORT I, Arelia LongestSchaevitz,  Chancellor Vanderloop M, the attending physician, personally viewed and interpreted this ECG.   Date: 10/20/2017  EKG Time: 0737  Rate: 88  Rhythm: normal sinus rhythm  Axis: Normal  Intervals:none  ST&T Change: No ST segment elevation or depression.  No abnormal T wave inversion.  ____________________________________________  RADIOLOGY  No acute finding ____________________________________________   PROCEDURES  Procedure(s) performed:   Procedures  Critical Care performed:   ____________________________________________   INITIAL IMPRESSION / ASSESSMENT AND PLAN / ED COURSE  Pertinent labs & imaging results that were available during my care of the patient were reviewed by me and considered in my medical decision making (see chart for details).  Differential includes, but is not limited to, viral syndrome, bronchitis including COPD exacerbation, pneumonia,  reactive airway disease including asthma, CHF including exacerbation with or without pulmonary/interstitial edema, pneumothorax, ACS, thoracic trauma, and pulmonary embolism.  As part of my medical decision making, I reviewed the following data within the electronic MEDICAL RECORD NUMBER Notes from prior ED visits  ----------------------------------------- 9:02 AM on 10/20/2017 -----------------------------------------  Patient at this time is requesting to be discharged.  I re-auscultated her lungs and she is without any respiratory distress at this time.  Lungs are clear.  The patient is speaking in full sentences without respiratory distress.  Patient to be discharged home.  She understands return medially for any worsening or concerning symptoms.      ____________________________________________   FINAL CLINICAL IMPRESSION(S) / ED DIAGNOSES  COPD exacerbation.    NEW MEDICATIONS STARTED DURING THIS VISIT:  This SmartLink is deprecated.  Use AVSMEDLIST instead to display the medication list for a patient.   Note:  This document was prepared using Dragon voice recognition software and may include unintentional dictation errors.     Myrna BlazerSchaevitz, Mance Vallejo Matthew, MD 10/20/17 [phone]

## 2017-10-30 ENCOUNTER — Emergency Department
Admission: EM | Admit: 2017-10-30 | Discharge: 2017-10-30 | Disposition: A | Discharge status: Home / Self Care | Payer: Medicare Other | Attending: Emergency Medicine | Admitting: Emergency Medicine

## 2017-10-30 ENCOUNTER — Emergency Department: Payer: Medicare Other

## 2017-10-30 ENCOUNTER — Encounter: Payer: Self-pay | Admitting: Emergency Medicine

## 2017-10-30 DIAGNOSIS — E119 Type 2 diabetes mellitus without complications: Secondary | ICD-10-CM | POA: Diagnosis not present

## 2017-10-30 DIAGNOSIS — J45909 Unspecified asthma, uncomplicated: Secondary | ICD-10-CM | POA: Diagnosis not present

## 2017-10-30 DIAGNOSIS — J441 Chronic obstructive pulmonary disease with (acute) exacerbation: Secondary | ICD-10-CM | POA: Diagnosis not present

## 2017-10-30 DIAGNOSIS — I10 Essential (primary) hypertension: Secondary | ICD-10-CM | POA: Insufficient documentation

## 2017-10-30 DIAGNOSIS — R0602 Shortness of breath: Secondary | ICD-10-CM | POA: Diagnosis not present

## 2017-10-30 DIAGNOSIS — F17210 Nicotine dependence, cigarettes, uncomplicated: Secondary | ICD-10-CM | POA: Diagnosis not present

## 2017-10-30 LAB — CBC WITH DIFFERENTIAL/PLATELET
BASOS ABS: 0.1 10*3/uL (ref 0–0.1)
Basophils Relative: 1 %
EOS ABS: 0.3 10*3/uL (ref 0–0.7)
EOS PCT: 4 %
HCT: 43.1 % (ref 35.0–47.0)
Hemoglobin: 14.5 g/dL (ref 12.0–16.0)
Lymphocytes Relative: 37 %
Lymphs Abs: 3.1 10*3/uL (ref 1.0–3.6)
MCH: 29.7 pg (ref 26.0–34.0)
MCHC: 33.5 g/dL (ref 32.0–36.0)
MCV: 88.6 fL (ref 80.0–100.0)
Monocytes Absolute: 0.6 10*3/uL (ref 0.2–0.9)
Monocytes Relative: 7 %
Neutro Abs: 4.5 10*3/uL (ref 1.4–6.5)
Neutrophils Relative %: 53 %
PLATELETS: 314 10*3/uL (ref 150–440)
RBC: 4.87 MIL/uL (ref 3.80–5.20)
RDW: 13.9 % (ref 11.5–14.5)
WBC: 8.6 10*3/uL (ref 3.6–11.0)

## 2017-10-30 LAB — BASIC METABOLIC PANEL
ANION GAP: 8 (ref 5–15)
BUN: 25 mg/dL — AB (ref 6–20)
CO2: 24 mmol/L (ref 22–32)
Calcium: 9 mg/dL (ref 8.9–10.3)
Chloride: 108 mmol/L (ref 101–111)
Creatinine, Ser: 1.38 mg/dL — ABNORMAL HIGH (ref 0.44–1.00)
GFR calc Af Amer: 50 mL/min — ABNORMAL LOW (ref >60)
GFR, EST NON AFRICAN AMERICAN: 43 mL/min — AB (ref >60)
Glucose, Bld: 120 mg/dL — ABNORMAL HIGH (ref 65–99)
POTASSIUM: 4.1 mmol/L (ref 3.5–5.1)
SODIUM: 140 mmol/L (ref 135–145)

## 2017-10-30 LAB — TROPONIN I: Troponin I: <0.03 ng/mL (ref <0.03)

## 2017-10-30 LAB — BLOOD GAS, VENOUS
Acid-Base Excess: 1 mmol/L (ref 0.0–2.0)
Bicarbonate: 26.6 mmol/L (ref 20.0–28.0)
O2 SAT: 81.7 %
PCO2 VEN: 45 mmHg (ref 44.0–60.0)
Patient temperature: 37
pH, Ven: 7.38 (ref 7.250–7.430)
pO2, Ven: 47 mmHg — ABNORMAL HIGH (ref 32.0–45.0)

## 2017-10-30 LAB — BRAIN NATRIURETIC PEPTIDE: B NATRIURETIC PEPTIDE 5: 25 pg/mL (ref 0.0–100.0)

## 2017-10-30 MED ORDER — IPRATROPIUM-ALBUTEROL 0.5-2.5 (3) MG/3ML IN SOLN
3.0000 mL | Freq: Once | RESPIRATORY_TRACT | Status: AC
  Administered 2017-10-30: 3 mL via RESPIRATORY_TRACT
  Filled 2017-10-30: qty 3

## 2017-10-30 MED ORDER — BUDESONIDE-FORMOTEROL FUMARATE 160-4.5 MCG/ACT IN AERO: 2.0000 | INHALATION_SPRAY | Freq: Two times a day (BID) | RESPIRATORY_TRACT | 12 refills | Status: DC

## 2017-10-30 MED ORDER — HYDROCOD POLST-CPM POLST ER 10-8 MG/5ML PO SUER: 5.0000 mL | Freq: Two times a day (BID) | ORAL | 0 refills | Status: DC

## 2017-10-30 MED ORDER — IPRATROPIUM-ALBUTEROL 0.5-2.5 (3) MG/3ML IN SOLN
RESPIRATORY_TRACT | Status: AC
  Filled 2017-10-30: qty 3

## 2017-10-30 MED ORDER — PREDNISONE 10 MG (21) PO TBPK: ORAL_TABLET | Freq: Every day | ORAL | 0 refills | Status: DC

## 2017-10-30 MED ORDER — MAGNESIUM SULFATE 2 GM/50ML IV SOLN
2.0000 g | Freq: Once | INTRAVENOUS | Status: AC
  Administered 2017-10-30: 2 g via INTRAVENOUS
  Filled 2017-10-30: qty 50

## 2017-10-30 NOTE — ED Triage Notes (Signed)
Pt to ED via EMS from home with c/o SOB xfew days, hx of copd. PT was found 95% with duoneb at home, denies fever. PT was given another duoneb and 125mg  solumedrol in route. Pt tachypnic. MD at bedside

## 2017-10-30 NOTE — ED Notes (Signed)
Pt states she has not taken BP meds xfew days

## 2017-10-30 NOTE — ED Provider Notes (Signed)
[facility] Emergency Department Provider Note       Time seen: ----------------------------------------- 9:23 AM on 10/30/2017 -----------------------------------------   I have reviewed the triage vital signs and the nursing notes.  HISTORY   Chief Complaint Shortness of Breath    HPI Samantha [name] is a 51 y.o. female with a history of COPD and diabetes who presents to the ED for shortness of breath and difficulty breathing.  Patient reports she has been short of breath and may have had a cold that made her COPD flareup.  She complains of shortness of breath and has had multiple breathing treatments with some improvement.  She received duo nebs and Solu-Medrol in route, again with some improvement in her symptoms.  She denies fevers, chills or other complaints.  Past Medical History:  Diagnosis Date  . Asthma   . COPD (chronic obstructive pulmonary disease) (HCC)   . Diabetes mellitus without complication (HCC)   . Hypertension     Patient Active Problem List   Diagnosis Date Noted  . Acute on chronic respiratory failure (HCC) 10/06/2015  . Acute on chronic respiratory failure with hypoxemia (HCC) 08/30/2015    Past Surgical History:  Procedure Laterality Date  . none      Allergies Percocet [oxycodone-acetaminophen]  Social History Social History   Tobacco Use  . Smoking status: Current Every Day Smoker    Packs/day: 0.50    Years: 23.00    Pack years: 11.50    Types: Cigarettes  . Smokeless tobacco: Never Used  Substance Use Topics  . Alcohol use: Yes  . Drug use: No    Review of Systems Constitutional: Negative for fever. Eyes: Negative for vision changes ENT: Positive for sinus congestion Cardiovascular: Negative for chest pain. Respiratory: Positive for shortness of breath and cough Gastrointestinal: Negative for abdominal pain, vomiting and diarrhea. Musculoskeletal: Negative for back pain. Skin: Negative for  rash. Neurological: Negative for headaches, focal weakness or numbness.  All systems negative/normal/unremarkable except as stated in the HPI  ____________________________________________   PHYSICAL EXAM:  VITAL SIGNS: ED Triage Vitals [10/30/17 0922]  Enc Vitals Group     BP      Pulse      Resp      Temp      Temp src      SpO2      Weight 260 lb (117.9 kg)     Height 5\' 7"  (1.702 m)     Head Circumference      Peak Flow      Pain Score 0     Pain Loc      Pain Edu?      Excl. in GC?     Constitutional: Alert and oriented.  Mild distress Eyes: Conjunctivae are normal. Normal extraocular movements. ENT   Head: Normocephalic and atraumatic.   Nose: No congestion/rhinnorhea.   Mouth/Throat: Mucous membranes are moist.   Neck: No stridor. Cardiovascular: Rapid rate, regular rhythm. No murmurs, rubs, or gallops. Respiratory: Normal respiratory effort without tachypnea nor retractions. Breath sounds are clear and equal bilaterally. No wheezes/rales/rhonchi. Gastrointestinal: Soft and nontender. Normal bowel sounds Musculoskeletal: Nontender with normal range of motion in extremities. No lower extremity tenderness nor edema. Neurologic:  Normal speech and language. No gross focal neurologic deficits are appreciated.  Tremulous Skin:  Skin is warm, dry and intact. No rash noted. Psychiatric: Mood and affect are normal. Speech and behavior are normal.  ____________________________________________  EKG: Interpreted by me.  Sinus rhythm  at a rate of 93 bpm, normal PR interval, normal QRS, normal QT.  ____________________________________________  ED COURSE:  Pertinent labs & imaging results that were available during my care of the patient were reviewed by me and considered in my medical decision making (see chart for details). Patient presents for shortness of breath, we will assess with labs and imaging as indicated.    Procedures ____________________________________________   LABS (pertinent positives/negatives)  Labs Reviewed  BASIC METABOLIC PANEL - Abnormal; Notable for the following components:      Result Value   Glucose, Bld 120 (*)    BUN 25 (*)    Creatinine, Ser 1.38 (*)    GFR calc non Af Amer 43 (*)    GFR calc Af Amer 50 (*)    All other components within normal limits  BLOOD GAS, VENOUS - Abnormal; Notable for the following components:   pO2, Ven 47.0 (*)    All other components within normal limits  CBC WITH DIFFERENTIAL/PLATELET  BRAIN NATRIURETIC PEPTIDE  TROPONIN I    RADIOLOGY  Chest x-ray  IMPRESSION: No edema or consolidation. ____________________________________________  DIFFERENTIAL DIAGNOSIS   COPD, URI, pneumonia, PE, pneumothorax  FINAL ASSESSMENT AND PLAN  COPD exacerbation   Plan: Patient had presented for shortness of breath. Patient's labs were reassuring. Patient's imaging was also reassuring.  Currently she is feeling better.  She did receive additional DuoNeb as well as IV magnesium.  She is stable for outpatient follow-up with steroids and cough suppressants.   Emily FilbertWilliams, Jonathan E, MD   Note: This note was generated in part or whole with voice recognition software. Voice recognition is usually quite accurate but there are transcription errors that can and very often do occur. I apologize for any typographical errors that were not detected and corrected.     Emily FilbertWilliams, Jonathan E, MD 10/30/17 [phone]

## 2017-10-30 NOTE — ED Notes (Signed)
Says her ride is here.  She is ready to go.  Feels beteter.

## 2017-11-06 ENCOUNTER — Encounter: Payer: Self-pay | Admitting: *Deleted

## 2017-11-09 ENCOUNTER — Ambulatory Visit: Payer: Medicare Other | Admitting: General Surgery

## 2017-11-10 ENCOUNTER — Emergency Department
Admission: EM | Admit: 2017-11-10 | Discharge: 2017-11-10 | Disposition: A | Discharge status: Home / Self Care | Payer: Medicare Other | Attending: Emergency Medicine | Admitting: Emergency Medicine

## 2017-11-10 ENCOUNTER — Emergency Department: Payer: Medicare Other

## 2017-11-10 ENCOUNTER — Other Ambulatory Visit: Payer: Self-pay

## 2017-11-10 DIAGNOSIS — J45909 Unspecified asthma, uncomplicated: Secondary | ICD-10-CM | POA: Diagnosis not present

## 2017-11-10 DIAGNOSIS — I10 Essential (primary) hypertension: Secondary | ICD-10-CM | POA: Insufficient documentation

## 2017-11-10 DIAGNOSIS — F17210 Nicotine dependence, cigarettes, uncomplicated: Secondary | ICD-10-CM | POA: Diagnosis not present

## 2017-11-10 DIAGNOSIS — Z79899 Other long term (current) drug therapy: Secondary | ICD-10-CM | POA: Diagnosis not present

## 2017-11-10 DIAGNOSIS — R05 Cough: Secondary | ICD-10-CM | POA: Diagnosis not present

## 2017-11-10 DIAGNOSIS — E119 Type 2 diabetes mellitus without complications: Secondary | ICD-10-CM | POA: Insufficient documentation

## 2017-11-10 DIAGNOSIS — Z7984 Long term (current) use of oral hypoglycemic drugs: Secondary | ICD-10-CM | POA: Diagnosis not present

## 2017-11-10 DIAGNOSIS — R0600 Dyspnea, unspecified: Secondary | ICD-10-CM | POA: Diagnosis not present

## 2017-11-10 DIAGNOSIS — J441 Chronic obstructive pulmonary disease with (acute) exacerbation: Secondary | ICD-10-CM | POA: Insufficient documentation

## 2017-11-10 DIAGNOSIS — R0602 Shortness of breath: Secondary | ICD-10-CM | POA: Diagnosis present

## 2017-11-10 LAB — COMPREHENSIVE METABOLIC PANEL
ALBUMIN: 3.8 g/dL (ref 3.5–5.0)
ALK PHOS: 73 U/L (ref 38–126)
ALT: 48 U/L (ref 14–54)
AST: 29 U/L (ref 15–41)
Anion gap: 10 (ref 5–15)
BILIRUBIN TOTAL: 0.6 mg/dL (ref 0.3–1.2)
BUN: 22 mg/dL — AB (ref 6–20)
CALCIUM: 9.6 mg/dL (ref 8.9–10.3)
CO2: 27 mmol/L (ref 22–32)
Chloride: 104 mmol/L (ref 101–111)
Creatinine, Ser: 1.96 mg/dL — ABNORMAL HIGH (ref 0.44–1.00)
GFR calc Af Amer: 33 mL/min — ABNORMAL LOW (ref >60)
GFR, EST NON AFRICAN AMERICAN: 28 mL/min — AB (ref >60)
GLUCOSE: 152 mg/dL — AB (ref 65–99)
POTASSIUM: 3.9 mmol/L (ref 3.5–5.1)
Sodium: 141 mmol/L (ref 135–145)
TOTAL PROTEIN: 6.8 g/dL (ref 6.5–8.1)

## 2017-11-10 LAB — CBC
HEMATOCRIT: 44.1 % (ref 35.0–47.0)
HEMOGLOBIN: 14.4 g/dL (ref 12.0–16.0)
MCH: 29 pg (ref 26.0–34.0)
MCHC: 32.6 g/dL (ref 32.0–36.0)
MCV: 88.8 fL (ref 80.0–100.0)
Platelets: 312 10*3/uL (ref 150–440)
RBC: 4.96 MIL/uL (ref 3.80–5.20)
RDW: 13.7 % (ref 11.5–14.5)
WBC: 9.4 10*3/uL (ref 3.6–11.0)

## 2017-11-10 LAB — TROPONIN I

## 2017-11-10 MED ORDER — MAGNESIUM SULFATE IN D5W 1-5 GM/100ML-% IV SOLN
1.0000 g | Freq: Once | INTRAVENOUS | Status: AC
  Administered 2017-11-10: 1 g via INTRAVENOUS
  Filled 2017-11-10: qty 100

## 2017-11-10 MED ORDER — AZITHROMYCIN 500 MG PO TABS: 500.0000 mg | ORAL_TABLET | Freq: Every day | ORAL | 0 refills | Status: AC

## 2017-11-10 MED ORDER — IPRATROPIUM-ALBUTEROL 0.5-2.5 (3) MG/3ML IN SOLN
3.0000 mL | Freq: Once | RESPIRATORY_TRACT | Status: AC
  Administered 2017-11-10: 3 mL via RESPIRATORY_TRACT
  Filled 2017-11-10: qty 6

## 2017-11-10 MED ORDER — PREDNISONE 20 MG PO TABS: 60.0000 mg | ORAL_TABLET | Freq: Every day | ORAL | 0 refills | Status: AC

## 2017-11-10 NOTE — ED Triage Notes (Signed)
Pt arrived via EMS from home with complaints of SOB for past two days. VS per EMS BP-135/100 BS-149 CO2-45. EMS reported that pt took "back to back" Albuterol at home to try to help breathing before they arrived. EMS gave two Duoneb and one Solumedrol. EMS reported that pt last round of prednisone was a month ago. Pt is on 2L nasal cannula at home. Pt has an allergy to Percocet. Pt has a Hx of COPD, asthma, diabetes, and HTN. Pt has a 20 gauge in right AC. Pt is calm, cooperative, and wheezing.

## 2017-11-10 NOTE — ED Provider Notes (Signed)
[facility] Emergency Department Provider Note    First MD Initiated Contact with Patient 11/10/17 [phone]     (approximate)  I have reviewed the triage vital signs and the nursing notes.   HISTORY  Chief Complaint Shortness of Breath    HPI Samantha [name] is a 51 y.o. female with below list of chronic medical conditions including COPD and asthma presents to the emergency department via EMS with progressive dyspnea over the past 2 days.  Patient admits to nonproductive cough.  Patient denies any fever afebrile on presentation to patient states that she is used multiple albuterol breathing treatments at home without improvement.  EMS administered 2 DuoNeb's in the 125 mg of Solu-Medrol.  Patient states last prednisone use was 1 month ago.  Patient does admit to continued tobacco use approximately 1/2 pack/day.  Patient is on 2 L of oxygen via nasal cannula at home.  Past Medical History:  Diagnosis Date  . Asthma   . COPD (chronic obstructive pulmonary disease) (HCC)   . Diabetes mellitus without complication (HCC)   . Hypertension     Patient Active Problem List   Diagnosis Date Noted  . Acute on chronic respiratory failure (HCC) 10/06/2015  . Acute on chronic respiratory failure with hypoxemia (HCC) 08/30/2015    Past Surgical History:  Procedure Laterality Date  . none      Prior to Admission medications   Medication Sig Start Date End Date Taking? Authorizing Provider  albuterol (PROVENTIL HFA;VENTOLIN HFA) 108 (90 Base) MCG/ACT inhaler Inhale 2 puffs into the lungs every 6 (six) hours as needed for wheezing or shortness of breath. 09/19/17  Yes Merrily Brittle, MD  budesonide-formoterol (SYMBICORT) 160-4.5 MCG/ACT inhaler Inhale 2 puffs 2 (two) times daily into the lungs. 10/30/17  Yes Emily Filbert, MD  gabapentin (NEURONTIN) 300 MG capsule Take 300 mg by mouth 2 (two) times daily.   Yes [provider]  glimepiride (AMARYL) 4 MG  tablet Take 4 mg by mouth daily.   Yes [provider]  losartan-hydrochlorothiazide (HYZAAR) 50-12.5 MG tablet Take 1 tablet by mouth daily. 02/22/16  Yes [provider]  metFORMIN (GLUCOPHAGE) 850 MG tablet Take 1 tablet by mouth daily. 12/11/15  Yes [provider]  montelukast (SINGULAIR) 10 MG tablet Take 10 mg by mouth at bedtime.    Yes [provider]  SPIRIVA HANDIHALER 18 MCG inhalation capsule Place 18 mcg daily into inhaler and inhale. 10/09/17  Yes [provider]  azithromycin (ZITHROMAX) 500 MG tablet Take 1 tablet (500 mg total) by mouth daily for 3 days. Take 1 tablet daily for 3 days. 11/10/17 11/13/17  Darci Current, MD  chlorpheniramine-HYDROcodone [facility] PENNKINETIC ER) 10-8 MG/5ML SUER Take 5 mLs 2 (two) times daily by mouth. Patient not taking: Reported on 11/10/2017 10/30/17   Emily Filbert, MD  predniSONE (DELTASONE) 20 MG tablet Take 3 tablets (60 mg total) by mouth daily for 5 days. 11/10/17 11/15/17  Darci Current, MD  predniSONE (DELTASONE) 50 MG tablet Take 1 tab PO daily x5days Patient not taking: Reported on 10/30/2017 10/20/17   Myrna Blazer, MD  predniSONE (STERAPRED UNI-PAK 21 TAB) 10 MG (21) TBPK tablet Take daily by mouth. Dispense steroid taper pack as directed Patient not taking: Reported on 11/10/2017 10/30/17   Emily Filbert, MD    Allergies Percocet [oxycodone-acetaminophen]  Family History  Problem Relation Age of Onset  . Asthma Mother   . Asthma Sister  Social History Social History   Tobacco Use  . Smoking status: Current Every Day Smoker    Packs/day: 0.50    Years: 23.00    Pack years: 11.50    Types: Cigarettes  . Smokeless tobacco: Never Used  Substance Use Topics  . Alcohol use: Yes  . Drug use: No    Review of Systems Constitutional: No fever/chills Eyes: No visual changes. ENT: No sore throat. Cardiovascular: Denies chest  pain. Respiratory: Positive for shortness of breath and coughing all the  gastrointestinal: No abdominal pain.  No nausea, no vomiting.  No diarrhea.  No constipation. Genitourinary: Negative for dysuria. Musculoskeletal: Negative for neck pain.  Negative for back pain. Integumentary: Negative for rash. Neurological: Negative for headaches, focal weakness or numbness.   ____________________________________________   PHYSICAL EXAM:  VITAL SIGNS: ED Triage Vitals  Enc Vitals Group     BP      Pulse      Resp      Temp      Temp src      SpO2      Weight      Height      Head Circumference      Peak Flow      Pain Score      Pain Loc      Pain Edu?      Excl. in GC?     Constitutional: Alert and oriented.  Apparent respiratory distress Eyes: Conjunctivae are normal.  Head: Atraumatic. Mouth/Throat: Mucous membranes are moist.  Oropharynx non-erythematous. Neck: No stridor.   Cardiovascular: Normal rate, regular rhythm. Good peripheral circulation. Grossly normal heart sounds. Respiratory: Tachypnea, positive accessory respiratory use, diffuse coarse wheezing. Gastrointestinal: Soft and nontender. No distention.   Musculoskeletal: No lower extremity tenderness nor edema. No gross deformities of extremities. Neurologic:  Normal speech and language. No gross focal neurologic deficits are appreciated.  Skin:  Skin is warm, dry and intact. No rash noted. Psychiatric: Mood and affect are normal. Speech and behavior are normal.  ____________________________________________   LABS (all labs ordered are listed, but only abnormal results are displayed)  Labs Reviewed  COMPREHENSIVE METABOLIC PANEL - Abnormal; Notable for the following components:      Result Value   Glucose, Bld 152 (*)    BUN 22 (*)    Creatinine, Ser 1.96 (*)    GFR calc non Af Amer 28 (*)    GFR calc Af Amer 33 (*)    All other components within normal limits  CBC  TROPONIN I    ____________________________________________  EKG ED ECG REPORT I, Matheny N BROWN, the attending physician, personally viewed and interpreted this ECG.   Date: 11/10/2017  EKG Time: 30 7 AM  Rate: 101  Rhythm: Sinus tachycardia  Axis: Normal  Intervals: Normal  ST&T Change: None  ____________________________________________  RADIOLOGY I, Silvis N BROWN, personally viewed and evaluated these images (plain radiographs) as part of my medical decision making, as well as reviewing the written report by the radiologist.  Dg Chest Portable 1 View  Result Date: 11/10/2017 CLINICAL DATA:  Cough and dyspnea. EXAM: PORTABLE CHEST 1 VIEW COMPARISON:  Radiographs 10/30/2017 FINDINGS: The cardiomediastinal contours are normal. Unchanged hyperinflation and bronchial thickening. Pulmonary vasculature is normal. No consolidation, pleural effusion, or pneumothorax. No acute osseous abnormalities are seen. IMPRESSION: Chronic bronchial thickening and hyperinflation. No new abnormality. Electronically Signed   By: Rubye OaksMelanie  Ehinger M.D.   On: 11/10/2017 05:04    _________________ Procedures  Local care: ____________________________________________   INITIAL IMPRESSION / ASSESSMENT AND PLAN / ED COURSE  As part of my medical decision making, I reviewed the following data within the electronic MEDICAL RECORD NUMBER89 year old female presenting with above-stated history and physical exam consistent with acute exacerbation of COPD.  Patient given additional DuoNeb in the emergency department as well as magnesium 1 g with resolution of symptoms.  Patient will be prescribed prednisone and azithromycin for home.  Patient states that she has albuterol at home for her nebulizer. ____________________________________________  FINAL CLINICAL IMPRESSION(S) / ED DIAGNOSES  Final diagnoses:  COPD exacerbation (HCC)     MEDICATIONS GIVEN DURING THIS VISIT:  Medications  magnesium sulfate IVPB 1 g 100 mL  (1 g Intravenous New Bag/Given 11/10/17 0446)  ipratropium-albuterol (DUONEB) 0.5-2.5 (3) MG/3ML nebulizer solution 3 mL (3 mLs Nebulization Given 11/10/17 0442)     ED Discharge Orders        Ordered    predniSONE (DELTASONE) 20 MG tablet  Daily     11/10/17 0538    azithromycin (ZITHROMAX) 500 MG tablet  Daily     11/10/17 0538       Note:  This document was prepared using Dragon voice recognition software and may include unintentional dictation errors.    Darci CurrentBrown,  N, MD 11/10/17 [phone]

## 2017-11-22 ENCOUNTER — Other Ambulatory Visit: Payer: Self-pay | Admitting: *Deleted

## 2017-11-22 NOTE — Patient Outreach (Addendum)
[facility] Care Management  11/22/2017  Samantha [name] 07-24-1966 [id number]   RN Health Coach telephone call to patient.  [employer] compliance verified. [facility] This provider is not in the network. Patient is not eligible for services.  Plan: This patient is not eligible for services  Gean MaidensFrances Shatora Weatherbee BSN RN [facility] Care Management [phone]

## 2017-12-04 ENCOUNTER — Encounter: Payer: Self-pay | Admitting: Emergency Medicine

## 2017-12-04 ENCOUNTER — Other Ambulatory Visit: Payer: Self-pay

## 2017-12-04 ENCOUNTER — Emergency Department
Admission: EM | Admit: 2017-12-04 | Discharge: 2017-12-04 | Disposition: A | Discharge status: Home / Self Care | Payer: Medicare Other | Attending: Emergency Medicine | Admitting: Emergency Medicine

## 2017-12-04 ENCOUNTER — Emergency Department: Payer: Medicare Other

## 2017-12-04 DIAGNOSIS — Z79899 Other long term (current) drug therapy: Secondary | ICD-10-CM | POA: Diagnosis not present

## 2017-12-04 DIAGNOSIS — J069 Acute upper respiratory infection, unspecified: Secondary | ICD-10-CM | POA: Diagnosis not present

## 2017-12-04 DIAGNOSIS — R0602 Shortness of breath: Secondary | ICD-10-CM | POA: Diagnosis not present

## 2017-12-04 DIAGNOSIS — E119 Type 2 diabetes mellitus without complications: Secondary | ICD-10-CM | POA: Diagnosis not present

## 2017-12-04 DIAGNOSIS — J441 Chronic obstructive pulmonary disease with (acute) exacerbation: Secondary | ICD-10-CM | POA: Diagnosis not present

## 2017-12-04 DIAGNOSIS — J45909 Unspecified asthma, uncomplicated: Secondary | ICD-10-CM | POA: Insufficient documentation

## 2017-12-04 DIAGNOSIS — F17210 Nicotine dependence, cigarettes, uncomplicated: Secondary | ICD-10-CM | POA: Diagnosis not present

## 2017-12-04 DIAGNOSIS — I10 Essential (primary) hypertension: Secondary | ICD-10-CM | POA: Diagnosis not present

## 2017-12-04 DIAGNOSIS — Z7984 Long term (current) use of oral hypoglycemic drugs: Secondary | ICD-10-CM | POA: Diagnosis not present

## 2017-12-04 LAB — BASIC METABOLIC PANEL
ANION GAP: 7 (ref 5–15)
BUN: 18 mg/dL (ref 6–20)
CHLORIDE: 107 mmol/L (ref 101–111)
CO2: 26 mmol/L (ref 22–32)
Calcium: 9.1 mg/dL (ref 8.9–10.3)
Creatinine, Ser: 1.4 mg/dL — ABNORMAL HIGH (ref 0.44–1.00)
GFR calc Af Amer: 49 mL/min — ABNORMAL LOW (ref >60)
GFR, EST NON AFRICAN AMERICAN: 42 mL/min — AB (ref >60)
GLUCOSE: 184 mg/dL — AB (ref 65–99)
POTASSIUM: 3.8 mmol/L (ref 3.5–5.1)
Sodium: 140 mmol/L (ref 135–145)

## 2017-12-04 LAB — CBC
HEMATOCRIT: 45.6 % (ref 35.0–47.0)
HEMOGLOBIN: 15.1 g/dL (ref 12.0–16.0)
MCH: 29.6 pg (ref 26.0–34.0)
MCHC: 33.1 g/dL (ref 32.0–36.0)
MCV: 89.4 fL (ref 80.0–100.0)
Platelets: 310 10*3/uL (ref 150–440)
RBC: 5.1 MIL/uL (ref 3.80–5.20)
RDW: 14.2 % (ref 11.5–14.5)
WBC: 7.2 10*3/uL (ref 3.6–11.0)

## 2017-12-04 LAB — GROUP A STREP BY PCR: GROUP A STREP BY PCR: NOT DETECTED

## 2017-12-04 LAB — TROPONIN I: Troponin I: <0.03 ng/mL (ref <0.03)

## 2017-12-04 MED ORDER — METHYLPREDNISOLONE SODIUM SUCC 125 MG IJ SOLR
125.0000 mg | Freq: Once | INTRAMUSCULAR | Status: AC
  Administered 2017-12-04: 125 mg via INTRAVENOUS
  Filled 2017-12-04: qty 2

## 2017-12-04 MED ORDER — IPRATROPIUM-ALBUTEROL 0.5-2.5 (3) MG/3ML IN SOLN
RESPIRATORY_TRACT | Status: AC
  Administered 2017-12-04: 3 mL via RESPIRATORY_TRACT
  Filled 2017-12-04: qty 3

## 2017-12-04 MED ORDER — PREDNISONE 50 MG PO TABS: ORAL_TABLET | ORAL | 0 refills | Status: DC

## 2017-12-04 MED ORDER — IPRATROPIUM-ALBUTEROL 0.5-2.5 (3) MG/3ML IN SOLN
3.0000 mL | Freq: Once | RESPIRATORY_TRACT | Status: AC
  Administered 2017-12-04: 3 mL via RESPIRATORY_TRACT

## 2017-12-04 MED ORDER — AZITHROMYCIN 250 MG PO TABS: ORAL_TABLET | ORAL | 0 refills | Status: AC

## 2017-12-04 MED ORDER — DEXTROSE 5 % IV SOLN
500.0000 mg | Freq: Once | INTRAVENOUS | Status: AC
  Administered 2017-12-04: 500 mg via INTRAVENOUS
  Filled 2017-12-04: qty 500

## 2017-12-04 MED ORDER — IPRATROPIUM-ALBUTEROL 0.5-2.5 (3) MG/3ML IN SOLN
9.0000 mL | Freq: Once | RESPIRATORY_TRACT | Status: AC
  Administered 2017-12-04: 9 mL via RESPIRATORY_TRACT
  Filled 2017-12-04: qty 9

## 2017-12-04 MED ORDER — MAGNESIUM SULFATE 2 GM/50ML IV SOLN
2.0000 g | Freq: Once | INTRAVENOUS | Status: AC
  Administered 2017-12-04: 2 g via INTRAVENOUS
  Filled 2017-12-04: qty 50

## 2017-12-04 NOTE — ED Triage Notes (Signed)
States shortness of breath x 2 days. Denies fevers. States history of COPD.

## 2017-12-04 NOTE — ED Provider Notes (Signed)
[facility] Emergency Department Provider Note  ____________________________________________   First MD Initiated Contact with Patient 12/04/17 1138     (approximate)  I have reviewed the triage vital signs and the nursing notes.   HISTORY  Chief Complaint Shortness of Breath   HPI Samantha [name] is a 51 y.o. female with a history of COPD as well as diabetes and hypertension was presenting to the emergency department today with 2 days of worsening shortness of breath as well as sore throat.  She denies fever.  Says she has cough but no production of sputum.  Denies any pain to her chest. Patient says this feels like her typical COPD.  Past Medical History:  Diagnosis Date  . Asthma   . COPD (chronic obstructive pulmonary disease) (HCC)   . Diabetes mellitus without complication (HCC)   . Hypertension     Patient Active Problem List   Diagnosis Date Noted  . Acute on chronic respiratory failure (HCC) 10/06/2015  . Acute on chronic respiratory failure with hypoxemia (HCC) 08/30/2015    Past Surgical History:  Procedure Laterality Date  . none      Prior to Admission medications   Medication Sig Start Date End Date Taking? Authorizing Provider  budesonide-formoterol (SYMBICORT) 160-4.5 MCG/ACT inhaler Inhale 2 puffs 2 (two) times daily into the lungs. 10/30/17  Yes Emily FilbertWilliams, Jonathan E, MD  gabapentin (NEURONTIN) 300 MG capsule Take 300 mg by mouth 2 (two) times daily.   Yes [provider]  glimepiride (AMARYL) 4 MG tablet Take 4 mg by mouth daily.   Yes [provider]  losartan-hydrochlorothiazide (HYZAAR) 50-12.5 MG tablet Take 1 tablet by mouth daily. 02/22/16  Yes [provider]  metFORMIN (GLUCOPHAGE) 850 MG tablet Take 1 tablet by mouth 2 (two) times daily with a meal.  12/11/15  Yes [provider]  montelukast (SINGULAIR) 10 MG tablet Take 10 mg by mouth at bedtime.    Yes [provider]    albuterol (PROVENTIL HFA;VENTOLIN HFA) 108 (90 Base) MCG/ACT inhaler Inhale 2 puffs into the lungs every 6 (six) hours as needed for wheezing or shortness of breath. 09/19/17   Merrily Brittleifenbark, Neil, MD  chlorpheniramine-HYDROcodone (TUSSIONEX PENNKINETIC ER) 10-8 MG/5ML SUER Take 5 mLs 2 (two) times daily by mouth. Patient not taking: Reported on 11/10/2017 10/30/17   Emily FilbertWilliams, Jonathan E, MD  predniSONE (DELTASONE) 50 MG tablet Take 1 tab PO daily x5days Patient not taking: Reported on 10/30/2017 10/20/17   Myrna BlazerSchaevitz, Anaisabel Pederson Matthew, MD  predniSONE (STERAPRED UNI-PAK 21 TAB) 10 MG (21) TBPK tablet Take daily by mouth. Dispense steroid taper pack as directed Patient not taking: Reported on 11/10/2017 10/30/17   Emily FilbertWilliams, Jonathan E, MD    Allergies Percocet [oxycodone-acetaminophen]  Family History  Problem Relation Age of Onset  . Asthma Mother   . Asthma Sister     Social History Social History   Tobacco Use  . Smoking status: Current Every Day Smoker    Packs/day: 0.10    Years: 23.00    Pack years: 2.30    Types: Cigarettes  . Smokeless tobacco: Never Used  Substance Use Topics  . Alcohol use: Yes  . Drug use: No    Review of Systems  Constitutional: No fever/chills Eyes: No visual changes. ENT: No sore throat. Cardiovascular: Denies chest pain. Respiratory: as above Gastrointestinal: No abdominal pain.  No nausea, no vomiting.  No diarrhea.  No constipation. Genitourinary: Negative for dysuria. Musculoskeletal: Negative for back pain. Skin:  Negative for rash. Neurological: Negative for headaches, focal weakness or numbness.   ____________________________________________   PHYSICAL EXAM:  VITAL SIGNS: ED Triage Vitals [12/04/17 1132]  Enc Vitals Group     BP 119/88     Pulse Rate 96     Resp (!) 24     Temp 98.5 F (36.9 C)     Temp Source Oral     SpO2 97 %     Weight 265 lb (120.2 kg)     Height 5\' 7"  (1.702 m)     Head Circumference      Peak Flow       Pain Score      Pain Loc      Pain Edu?      Excl. in GC?     Constitutional: Alert and oriented. Well appearing and in no acute distress. Eyes: Conjunctivae are normal.  Head: Atraumatic. Nose: No congestion/rhinnorhea. Mouth/Throat: Mucous membranes are moist.  Erythema to the posterior pharynx without any swelling of the tonsils nor uvula. Neck: No stridor.   Cardiovascular: Normal rate, regular rhythm. Grossly normal heart sounds.   Respiratory: Tachypneic with labored respirations with speaking in full sentences.  Wheezing throughout with a prolonged expiratory phase. Gastrointestinal: Soft and nontender. No distention.  Musculoskeletal: No lower extremity tenderness nor edema.  No joint effusions. Neurologic:  Normal speech and language. No gross focal neurologic deficits are appreciated. Skin:  Skin is warm, dry and intact. No rash noted. Psychiatric: Mood and affect are normal. Speech and behavior are normal.  ____________________________________________   LABS (all labs ordered are listed, but only abnormal results are displayed)  Labs Reviewed  BASIC METABOLIC PANEL - Abnormal; Notable for the following components:      Result Value   Glucose, Bld 184 (*)    Creatinine, Ser 1.40 (*)    GFR calc non Af Amer 42 (*)    GFR calc Af Amer 49 (*)    All other components within normal limits  GROUP A STREP BY PCR  CBC  TROPONIN I   ____________________________________________  EKG  ED ECG REPORT I, Arelia Longestavid M Navon Kotowski, the attending physician, personally viewed and interpreted this ECG.   Date: 12/04/2017  EKG Time: 1131  Rate: 98  Rhythm: normal sinus rhythm  Axis: Normal  Intervals:none  ST&T Change: No ST segment elevation or depression.  No abnormal T wave inversion.  ____________________________________________  RADIOLOGY  Chronic bronchitic changes without infiltrate ____________________________________________   PROCEDURES  Procedure(s)  performed:   Procedures  Critical Care performed:   ____________________________________________   INITIAL IMPRESSION / ASSESSMENT AND PLAN / ED COURSE  Pertinent labs & imaging results that were available during my care of the patient were reviewed by me and considered in my medical decision making (see chart for details).  Differential includes, but is not limited to, viral syndrome, bronchitis including COPD exacerbation, pneumonia, reactive airway disease including asthma, CHF including exacerbation with or without pulmonary/interstitial edema, pneumothorax, ACS, thoracic trauma, and pulmonary embolism. As part of my medical decision making, I reviewed the following data within the electronic MEDICAL RECORD NUMBER Notes from prior ED visits  ----------------------------------------- 2:27 PM on 12/04/2017 -----------------------------------------  Patient says that she feels at her baseline.  No respiratory distress at this time.  Re-auscultated her lungs and there is no wheezing.  She is speaking in full sentences and does not of any respiratory distress.  Patient to be discharged home at this time.  Will discharge with prednisone as  well as azithromycin.  She is understanding of the plan willing to comply.      ____________________________________________   FINAL CLINICAL IMPRESSION(S) / ED DIAGNOSES  COPD exacerbation.  Upper respiratory infection.    NEW MEDICATIONS STARTED DURING THIS VISIT:  This SmartLink is deprecated. Use AVSMEDLIST instead to display the medication list for a patient.   Note:  This document was prepared using Dragon voice recognition software and may include unintentional dictation errors.     Myrna Blazer, MD 12/04/17 [phone]

## 2017-12-06 ENCOUNTER — Ambulatory Visit: Payer: Medicare Other | Admitting: General Surgery

## 2017-12-19 ENCOUNTER — Other Ambulatory Visit: Payer: Self-pay

## 2017-12-19 ENCOUNTER — Emergency Department
Admission: EM | Admit: 2017-12-19 | Discharge: 2017-12-19 | Disposition: A | Discharge status: Home / Self Care | Payer: Medicare Other | Attending: Emergency Medicine | Admitting: Emergency Medicine

## 2017-12-19 ENCOUNTER — Emergency Department: Payer: Medicare Other

## 2017-12-19 ENCOUNTER — Encounter: Payer: Self-pay | Admitting: *Deleted

## 2017-12-19 DIAGNOSIS — J441 Chronic obstructive pulmonary disease with (acute) exacerbation: Secondary | ICD-10-CM | POA: Diagnosis not present

## 2017-12-19 DIAGNOSIS — J189 Pneumonia, unspecified organism: Secondary | ICD-10-CM

## 2017-12-19 DIAGNOSIS — J181 Lobar pneumonia, unspecified organism: Secondary | ICD-10-CM | POA: Insufficient documentation

## 2017-12-19 DIAGNOSIS — R0600 Dyspnea, unspecified: Secondary | ICD-10-CM | POA: Diagnosis not present

## 2017-12-19 DIAGNOSIS — R0603 Acute respiratory distress: Secondary | ICD-10-CM | POA: Diagnosis present

## 2017-12-19 DIAGNOSIS — R0602 Shortness of breath: Secondary | ICD-10-CM | POA: Diagnosis not present

## 2017-12-19 IMAGING — DX DG CHEST 1V PORT
1 series · 1 of 1 positions shown · non-contrast
Comparison: April 08, 2016

CLINICAL DATA: Shortness of breath and cough for 1 day

EXAM:
PORTABLE CHEST 1 VIEW

[chest ap]
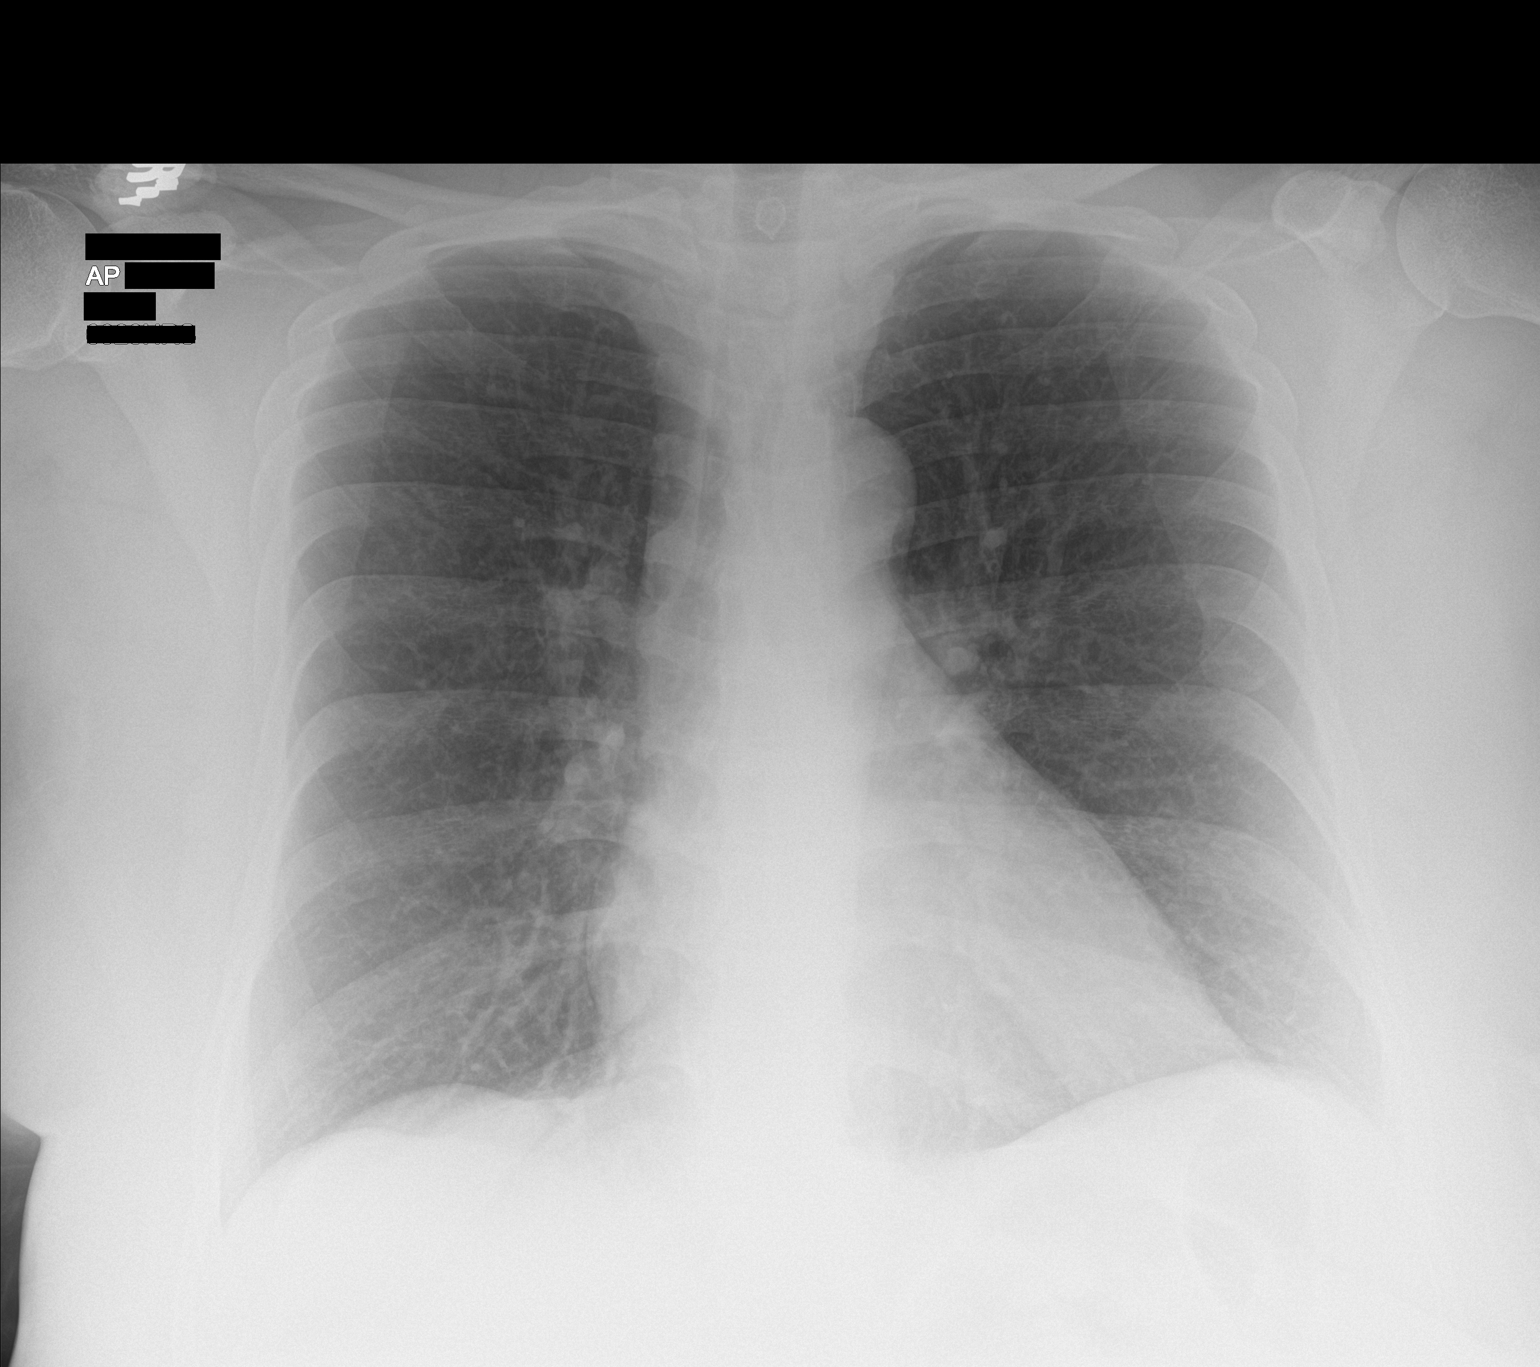

[1 of 1 positions shown; findings below may reference images not displayed]

FINDINGS: There is no edema or consolidation. The heart size and pulmonary
vascularity are normal. No adenopathy. No pneumothorax. No bone
lesions.
IMPRESSION: No edema or consolidation.

## 2017-12-19 MED ORDER — MAGNESIUM SULFATE 2 GM/50ML IV SOLN
2.0000 g | INTRAVENOUS | Status: AC
  Administered 2017-12-19: 2 g via INTRAVENOUS
  Filled 2017-12-19: qty 50

## 2017-12-19 MED ORDER — PREDNISONE 10 MG PO TABS: ORAL_TABLET | ORAL | 0 refills | Status: DC

## 2017-12-19 MED ORDER — LEVOFLOXACIN 750 MG PO TABS: 750.0000 mg | ORAL_TABLET | Freq: Every day | ORAL | 0 refills | Status: DC

## 2017-12-19 MED ORDER — ALBUTEROL SULFATE (2.5 MG/3ML) 0.083% IN NEBU
5.0000 mg | INHALATION_SOLUTION | Freq: Once | RESPIRATORY_TRACT | Status: AC
  Administered 2017-12-19: 5 mg via RESPIRATORY_TRACT
  Filled 2017-12-19: qty 6

## 2017-12-19 MED ORDER — SODIUM CHLORIDE 0.9 % IV BOLUS (SEPSIS)
1000.0000 mL | Freq: Once | INTRAVENOUS | Status: AC
  Administered 2017-12-19: 1000 mL via INTRAVENOUS

## 2017-12-19 MED ORDER — IPRATROPIUM-ALBUTEROL 0.5-2.5 (3) MG/3ML IN SOLN
6.0000 mL | Freq: Once | RESPIRATORY_TRACT | Status: AC
  Administered 2017-12-19: 6 mL via RESPIRATORY_TRACT

## 2017-12-19 NOTE — ED Triage Notes (Signed)
around 0030 pt stated feeling short of breath and having trouble breathing. Pt started with a nebulizer and did them throughout the night. Called EMS and they gave her 2 additional and 125mg  of Solumedrol. Pt continues to feel tight and have audible wheezing. RT at bedside. Started duoneb via mask.

## 2017-12-19 NOTE — ED Provider Notes (Signed)
[facility] Emergency Department Provider Note  ____________________________________________  Time seen: Approximately 7:11 AM  I have reviewed the triage vital signs and the nursing notes.   HISTORY  Chief Complaint Respiratory Distress    HPI Samantha [name] is a 52 y.o. female who complains of shortness of breath started overnight, constant, no aggravating or alleviating factors. Tried her bronchodilators at home without relief. Denies chest pain. No fevers chills or sweats. She is having a productive cough for the past few days. She thinks a recent upper respiratory illness contributed to this today. Symptoms are moderate to severe. Given 2 nebulizer treatments and 125 of Solu-Medrol by EMS en route.     Past Medical History:  Diagnosis Date  . Asthma   . COPD (chronic obstructive pulmonary disease) (HCC)   . Diabetes mellitus without complication (HCC)   . Hypertension      Patient Active Problem List   Diagnosis Date Noted  . Acute on chronic respiratory failure (HCC) 10/06/2015  . Acute on chronic respiratory failure with hypoxemia (HCC) 08/30/2015     Past Surgical History:  Procedure Laterality Date  . none       Prior to Admission medications   Medication Sig Start Date End Date Taking? Authorizing Provider  albuterol (PROVENTIL HFA;VENTOLIN HFA) 108 (90 Base) MCG/ACT inhaler Inhale 2 puffs into the lungs every 6 (six) hours as needed for wheezing or shortness of breath. 09/19/17   Merrily Brittle, MD  budesonide-formoterol (SYMBICORT) 160-4.5 MCG/ACT inhaler Inhale 2 puffs 2 (two) times daily into the lungs. 10/30/17   Emily Filbert, MD  chlorpheniramine-HYDROcodone (TUSSIONEX PENNKINETIC ER) 10-8 MG/5ML SUER Take 5 mLs 2 (two) times daily by mouth. Patient not taking: Reported on 11/10/2017 10/30/17   Emily Filbert, MD  gabapentin (NEURONTIN) 300 MG capsule Take 300 mg by mouth 2 (two) times daily.    [provider]  glimepiride (AMARYL) 4 MG tablet Take 4 mg by mouth daily.    [provider]  losartan-hydrochlorothiazide (HYZAAR) 50-12.5 MG tablet Take 1 tablet by mouth daily. 02/22/16   [provider]  metFORMIN (GLUCOPHAGE) 850 MG tablet Take 1 tablet by mouth 2 (two) times daily with a meal.  12/11/15   [provider]  montelukast (SINGULAIR) 10 MG tablet Take 10 mg by mouth at bedtime.     [provider]  predniSONE (DELTASONE) 50 MG tablet Take 1 tab daily x5 days 12/04/17   Myrna Blazer, MD     Allergies Percocet [oxycodone-acetaminophen]   Family History  Problem Relation Age of Onset  . Asthma Mother   . Asthma Sister     Social History Social History   Tobacco Use  . Smoking status: Current Every Day Smoker    Packs/day: 0.10    Years: 23.00    Pack years: 2.30    Types: Cigarettes  . Smokeless tobacco: Never Used  Substance Use Topics  . Alcohol use: Yes  . Drug use: No    Review of Systems  Constitutional:   No fever or chills.  ENT:   No sore throat. No rhinorrhea. Cardiovascular:   No chest pain or syncope. Respiratory:   Positive shortness of breath and productive cough. Gastrointestinal:   Negative for abdominal pain, vomiting and diarrhea.  Musculoskeletal:   Negative for focal pain or swelling All other systems reviewed and are negative except as documented above in ROS and HPI.  ____________________________________________   PHYSICAL EXAM:  VITAL SIGNS:  ED Triage Vitals  Enc Vitals Group     BP 12/19/17 0650 (!) 175/107     Pulse Rate 12/19/17 0650 100     Resp 12/19/17 0650 19     Temp 12/19/17 0650 98.3 F (36.8 C)     Temp Source 12/19/17 0650 Oral     SpO2 12/19/17 0650 97 %     Weight 12/19/17 0652 256 lb (116.1 kg)     Height 12/19/17 0652 5\' 7"  (1.702 m)     Head Circumference --      Peak Flow --      Pain Score --      Pain Loc --      Pain Edu? --      Excl. in GC? --      Vital signs reviewed, nursing assessments reviewed.   Constitutional:   Alert and oriented. Well appearing and in no distress. Eyes:   No scleral icterus.  EOMI. No nystagmus. No conjunctival pallor. PERRL. ENT   Head:   Normocephalic and atraumatic.   Nose:   No congestion/rhinnorhea.    Mouth/Throat:   MMM, no pharyngeal erythema. No peritonsillar mass.    Neck:   No meningismus. Full ROM. Trachea midline Hematological/Lymphatic/Immunilogical:   No cervical lymphadenopathy. Cardiovascular:   RRR. Symmetric bilateral radial and DP pulses.  No murmurs.  Respiratory:   Normal respiratory effort without tachypnea/retractions. Diffuse expiratory wheezing with prolonged expiratory phase. No focal crackles. Good air entry in all lung fields Gastrointestinal:   Soft and nontender. Non distended. There is no CVA tenderness.  No rebound, rigidity, or guarding. Genitourinary:   deferred Musculoskeletal:   Normal range of motion in all extremities. No joint effusions.  No lower extremity tenderness.  No edema. Neurologic:   Normal speech and language.  Motor grossly intact. No gross focal neurologic deficits are appreciated.  Skin:    Skin is warm, dry and intact. No rash noted.  No petechiae, purpura, or bullae.  ____________________________________________    LABS (pertinent positives/negatives) (all labs ordered are listed, but only abnormal results are displayed) Labs Reviewed - No data to display ____________________________________________   EKG  Interpreted by me Sinus rhythm rate of 99, normal axis intervals QRS ST segments and T waves.  ____________________________________________    RADIOLOGY  No results found.  ____________________________________________   PROCEDURES Procedures  ____________________________________________    CLINICAL IMPRESSION / ASSESSMENT AND PLAN / ED COURSE  Pertinent labs & imaging results that were available during my  care of the patient were reviewed by me and considered in my medical decision making (see chart for details).     Clinical Course as of Dec 19 913  Tue Dec 19, 2017  0713 Pt p/w dyspnea, diffuse wheezing c/w copd exac, precipitated by recent URI. Not in distress, well appearing. Will give continued sx support, check cxr.  [PS]  0818 Cxr suggests early pneumonia in RLL. Will start abx, plan to discharge if symptoms remain controlled.  [PS]  0900 Patient feels better. Wheezing is persistent but expiratory phase is normalized. Offered admission, but patient feels well enough to manage her symptoms outpatient. I'll start her on Levaquin and prednisone taper. Counseled her on the risk of soft tissue injury which she understands. Return precautions discussed  [PS]    Clinical Course User Index [PS] Sharman Cheek, MD      ----------------------------------------- 9:16 AM on 12/19/2017 -----------------------------------------  Considering the patient's symptoms, medical history, and physical examination today, I have low suspicion for  ACS, PE, TAD, pneumothorax, carditis, mediastinitis, pneumonia, CHF, or sepsis.   ____________________________________________   FINAL CLINICAL IMPRESSION(S) / ED DIAGNOSES    Final diagnoses:  None       Portions of this note were generated with dragon dictation software. Dictation errors may occur despite best attempts at proofreading.    Sharman CheekStafford, Latise Dilley, MD 12/19/17 [phone]

## 2017-12-19 NOTE — ED Notes (Signed)
Patient given crackers and peanut butter and ginger ale.

## 2017-12-19 NOTE — ED Notes (Signed)
Pt ambulated out to desk asking if the doctor was finished with her yet.  Pt short of breath walking to the desk. Informed MD and primary RN.

## 2017-12-19 NOTE — ED Notes (Signed)
Patient ambulatory to lobby with steady gait and NAD noted. Verbalized understanding of discharge instructions, prescriptions, and follow-up care.

## 2017-12-27 IMAGING — CR DG CHEST 2V
2 series · 2 of 2 positions shown · non-contrast
Comparison: 08/19/2016 and prior chest radiographs

CLINICAL DATA: Shortness of breath for 2 days.

EXAM:
CHEST  2 VIEW

[chest pa]
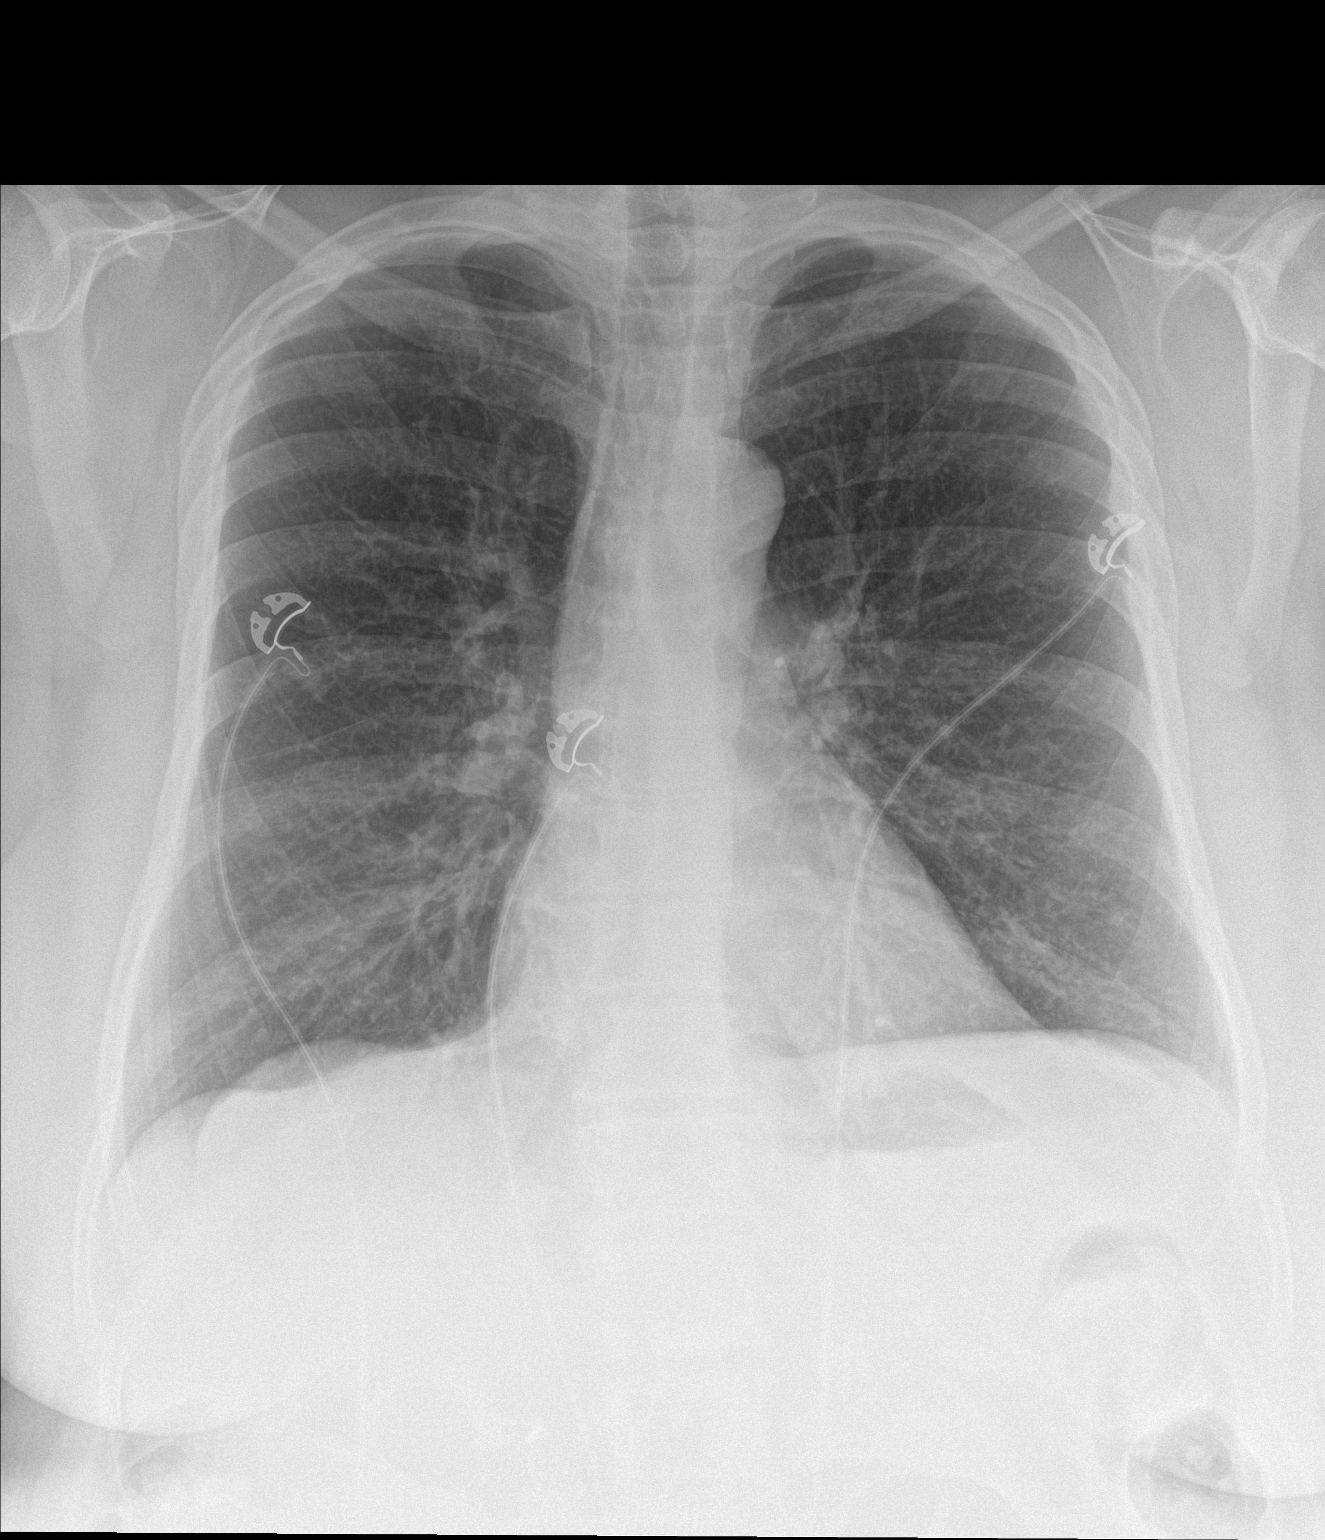

[chest lat]
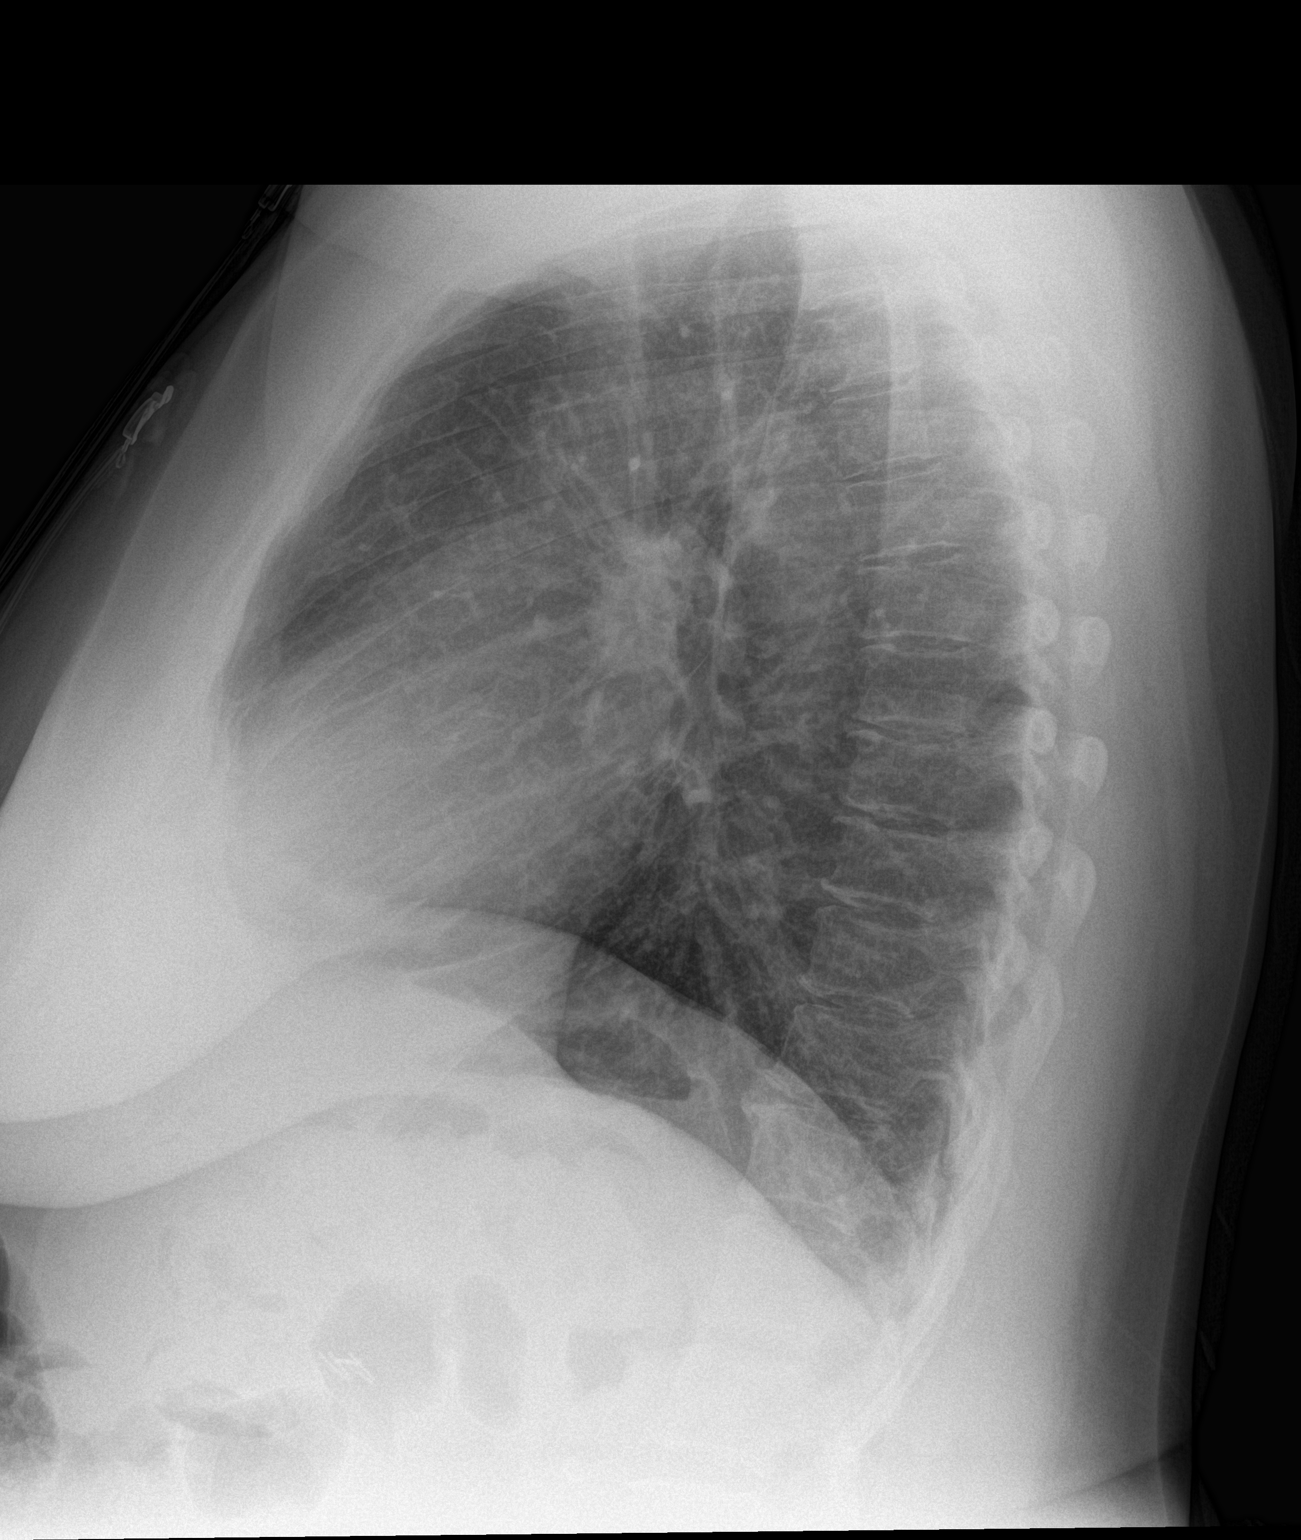

[2 of 2 positions shown; findings below may reference images not displayed]

FINDINGS: The cardiomediastinal silhouette is unremarkable.

Mild peribronchial thickening is unchanged.

There is no evidence of focal airspace disease, pulmonary edema,
suspicious pulmonary nodule/mass, pleural effusion, or pneumothorax.
No acute bony abnormalities are identified.
IMPRESSION: No evidence of acute cardiopulmonary disease.

Mild chronic peribronchial thickening.

## 2018-01-23 ENCOUNTER — Ambulatory Visit: Payer: Medicare Other | Admitting: General Surgery

## 2018-01-30 ENCOUNTER — Emergency Department
Admission: EM | Admit: 2018-01-30 | Discharge: 2018-01-30 | Disposition: A | Discharge status: Home / Self Care | Payer: Medicare Other | Attending: Emergency Medicine | Admitting: Emergency Medicine

## 2018-01-30 ENCOUNTER — Emergency Department: Payer: Medicare Other

## 2018-01-30 ENCOUNTER — Other Ambulatory Visit: Payer: Self-pay

## 2018-01-30 DIAGNOSIS — J441 Chronic obstructive pulmonary disease with (acute) exacerbation: Secondary | ICD-10-CM | POA: Insufficient documentation

## 2018-01-30 DIAGNOSIS — R0602 Shortness of breath: Secondary | ICD-10-CM | POA: Diagnosis not present

## 2018-01-30 DIAGNOSIS — R05 Cough: Secondary | ICD-10-CM | POA: Diagnosis not present

## 2018-01-30 DIAGNOSIS — I10 Essential (primary) hypertension: Secondary | ICD-10-CM | POA: Diagnosis not present

## 2018-01-30 DIAGNOSIS — J45909 Unspecified asthma, uncomplicated: Secondary | ICD-10-CM | POA: Diagnosis not present

## 2018-01-30 DIAGNOSIS — F17210 Nicotine dependence, cigarettes, uncomplicated: Secondary | ICD-10-CM | POA: Insufficient documentation

## 2018-01-30 DIAGNOSIS — E119 Type 2 diabetes mellitus without complications: Secondary | ICD-10-CM | POA: Diagnosis not present

## 2018-01-30 LAB — BLOOD GAS, VENOUS
ACID-BASE DEFICIT: 3.7 mmol/L — AB (ref 0.0–2.0)
Bicarbonate: 23.1 mmol/L (ref 20.0–28.0)
FIO2: 0.21
O2 SAT: 85.2 %
PATIENT TEMPERATURE: 37
PH VEN: 7.3 (ref 7.250–7.430)
pCO2, Ven: 47 mmHg (ref 44.0–60.0)
pO2, Ven: 56 mmHg — ABNORMAL HIGH (ref 32.0–45.0)

## 2018-01-30 LAB — CBC WITH DIFFERENTIAL/PLATELET
BASOS ABS: 0 10*3/uL (ref 0–0.1)
BASOS PCT: 1 %
EOS ABS: 0.4 10*3/uL (ref 0–0.7)
EOS PCT: 6 %
HCT: 44 % (ref 35.0–47.0)
Hemoglobin: 14.4 g/dL (ref 12.0–16.0)
Lymphocytes Relative: 38 %
Lymphs Abs: 3.1 10*3/uL (ref 1.0–3.6)
MCH: 29.2 pg (ref 26.0–34.0)
MCHC: 32.8 g/dL (ref 32.0–36.0)
MCV: 88.9 fL (ref 80.0–100.0)
Monocytes Absolute: 0.5 10*3/uL (ref 0.2–0.9)
Monocytes Relative: 6 %
Neutro Abs: 4 10*3/uL (ref 1.4–6.5)
Neutrophils Relative %: 49 %
PLATELETS: 342 10*3/uL (ref 150–440)
RBC: 4.95 MIL/uL (ref 3.80–5.20)
RDW: 14.4 % (ref 11.5–14.5)
WBC: 8 10*3/uL (ref 3.6–11.0)

## 2018-01-30 LAB — BASIC METABOLIC PANEL
ANION GAP: 8 (ref 5–15)
BUN: 33 mg/dL — ABNORMAL HIGH (ref 6–20)
CALCIUM: 9.1 mg/dL (ref 8.9–10.3)
CO2: 21 mmol/L — ABNORMAL LOW (ref 22–32)
Chloride: 110 mmol/L (ref 101–111)
Creatinine, Ser: 1.54 mg/dL — ABNORMAL HIGH (ref 0.44–1.00)
GFR calc Af Amer: 44 mL/min — ABNORMAL LOW (ref >60)
GFR, EST NON AFRICAN AMERICAN: 38 mL/min — AB (ref >60)
Glucose, Bld: 133 mg/dL — ABNORMAL HIGH (ref 65–99)
Potassium: 3.8 mmol/L (ref 3.5–5.1)
SODIUM: 139 mmol/L (ref 135–145)

## 2018-01-30 LAB — TROPONIN I: Troponin I: <0.03 ng/mL (ref <0.03)

## 2018-01-30 LAB — BRAIN NATRIURETIC PEPTIDE: B NATRIURETIC PEPTIDE 5: 14 pg/mL (ref 0.0–100.0)

## 2018-01-30 MED ORDER — IPRATROPIUM-ALBUTEROL 0.5-2.5 (3) MG/3ML IN SOLN
3.0000 mL | Freq: Once | RESPIRATORY_TRACT | Status: AC
  Administered 2018-01-30: 3 mL via RESPIRATORY_TRACT
  Filled 2018-01-30: qty 3

## 2018-01-30 MED ORDER — PREDNISONE 10 MG (21) PO TBPK: ORAL_TABLET | Freq: Every day | ORAL | 0 refills | Status: DC

## 2018-01-30 MED ORDER — MAGNESIUM SULFATE 2 GM/50ML IV SOLN
2.0000 g | Freq: Once | INTRAVENOUS | Status: AC
  Administered 2018-01-30: 2 g via INTRAVENOUS
  Filled 2018-01-30: qty 50

## 2018-01-30 NOTE — ED Notes (Signed)
Radiology to bedside at this time. 

## 2018-01-30 NOTE — ED Triage Notes (Signed)
She arrives today via [facility] from home with reports on increased shortness of breath with wheezing over the last three days  Pt verbalizes no relief from her albuterol treatment this am  Audible wheezing can be heard upon arrival   Hx COPD  HTN  DM

## 2018-01-30 NOTE — ED Notes (Signed)
Pt demanding to leave, states, "I have to get my grandbaby at 11.  I feel better.  I need to see the doctor so I can go."  EDP notified.

## 2018-01-30 NOTE — ED Provider Notes (Signed)
[facility] Emergency Department Provider Note       Time seen: ----------------------------------------- 8:56 AM on 01/30/2018 -----------------------------------------   I have reviewed the triage vital signs and the nursing notes.  HISTORY   Chief Complaint No chief complaint on file.    HPI Berenice A Yetta BarreJones is a 52 y.o. female with a history of asthma, COPD, diabetes and hypertension who presents to the ED for shortness of breath.  Patient states she has had upper respiratory symptoms for the past several days.  She was given a DuoNeb and Solu-Medrol in route by EMS.  Patient reports mild improvement in her symptoms.  Typically she states magnesium helps her symptoms significantly.  She denies any flu symptoms or productive cough.  Past Medical History:  Diagnosis Date  . Asthma   . COPD (chronic obstructive pulmonary disease) (HCC)   . Diabetes mellitus without complication (HCC)   . Hypertension     Patient Active Problem List   Diagnosis Date Noted  . Acute on chronic respiratory failure (HCC) 10/06/2015  . Acute on chronic respiratory failure with hypoxemia (HCC) 08/30/2015    Past Surgical History:  Procedure Laterality Date  . none      Allergies Percocet [oxycodone-acetaminophen]  Social History Social History   Tobacco Use  . Smoking status: Current Every Day Smoker    Packs/day: 0.10    Years: 23.00    Pack years: 2.30    Types: Cigarettes  . Smokeless tobacco: Never Used  Substance Use Topics  . Alcohol use: Yes  . Drug use: No    Review of Systems Constitutional: Negative for fever. Cardiovascular: Negative for chest pain. Respiratory: Positive for shortness of breath and cough Gastrointestinal: Negative for abdominal pain, vomiting and diarrhea. Musculoskeletal: Negative for back pain. Skin: Negative for rash. Neurological: Negative for headaches, focal weakness or numbness.  All systems  negative/normal/unremarkable except as stated in the HPI  ____________________________________________   PHYSICAL EXAM:  VITAL SIGNS: ED Triage Vitals  Enc Vitals Group     BP      Pulse      Resp      Temp      Temp src      SpO2      Weight      Height      Head Circumference      Peak Flow      Pain Score      Pain Loc      Pain Edu?      Excl. in GC?     Constitutional: Alert and oriented.  Mild distress Eyes: Conjunctivae are normal. Normal extraocular movements. ENT   Head: Normocephalic and atraumatic.   Nose: No congestion/rhinnorhea.   Mouth/Throat: Mucous membranes are moist.   Neck: No stridor. Cardiovascular: Normal rate, regular rhythm. No murmurs, rubs, or gallops. Respiratory: Tachypnea with wheezing bilaterally Gastrointestinal: Soft and nontender. Normal bowel sounds Musculoskeletal: Nontender with normal range of motion in extremities. No lower extremity tenderness nor edema. Neurologic:  Normal speech and language. No gross focal neurologic deficits are appreciated.  Skin:  Skin is warm, dry and intact. No rash noted. Psychiatric: Mood and affect are normal. Speech and behavior are normal.  ____________________________________________  EKG: Interpreted by me.  Sinus rhythm the rate of 96 bpm, normal PR interval, LVH, normal QT ____________________________________________  ED COURSE:  As part of my medical decision making, I reviewed the following data within the electronic MEDICAL RECORD NUMBER History obtained from family  if available, nursing notes, old chart and ekg, as well as notes from prior ED visits. Patient presented for shortness of breath and likely COPD exacerbation, we will assess with labs and imaging as indicated at this time. Clinical Course as of Jan 31 1016  Tue Jan 30, 2018  0927 Patient reports she is feeling some better after DuoNeb's and magnesium  [JW]    Clinical Course User Index [JW] Emily Filbert, MD    Procedures ____________________________________________   LABS (pertinent positives/negatives)  Labs Reviewed  BLOOD GAS, VENOUS - Abnormal; Notable for the following components:      Result Value   pO2, Ven 56.0 (*)    Acid-base deficit 3.7 (*)    All other components within normal limits  BASIC METABOLIC PANEL - Abnormal; Notable for the following components:   CO2 21 (*)    Glucose, Bld 133 (*)    BUN 33 (*)    Creatinine, Ser 1.54 (*)    GFR calc non Af Amer 38 (*)    GFR calc Af Amer 44 (*)    All other components within normal limits  TROPONIN I  CBC WITH DIFFERENTIAL/PLATELET  BRAIN NATRIURETIC PEPTIDE    RADIOLOGY Images were viewed by me  Chest x-ray Does not reveal any acute process ____________________________________________  DIFFERENTIAL DIAGNOSIS   COPD, asthma, pneumonia, PE, pneumothorax, influenza, URI  FINAL ASSESSMENT AND PLAN  COPD exacerbation   Plan: Patient had presented for shortness of breath. Patient's labs did reveal some mild renal insufficiency so I will encourage increase fluid intake. Patient's imaging did not reveal any acute process.  Abruptly and unexpectedly patient states she has to go pick up her grandchild, despite only being here for an hour and a half.  She will be discharged with steroids to take as a taper pack   Ulice Dash, MD   Note: This note was generated in part or whole with voice recognition software. Voice recognition is usually quite accurate but there are transcription errors that can and very often do occur. I apologize for any typographical errors that were not detected and corrected.     Emily Filbert, MD 01/30/18 1018

## 2018-02-08 ENCOUNTER — Ambulatory Visit: Payer: Medicare Other | Admitting: General Surgery

## 2018-02-20 ENCOUNTER — Encounter: Payer: Self-pay | Admitting: *Deleted

## 2018-02-21 ENCOUNTER — Inpatient Hospital Stay
Admission: EM | Admit: 2018-02-21 | Discharge: 2018-02-22 | DRG: 190 | Disposition: A | Discharge status: Home / Self Care | Payer: Medicare Other | Attending: Specialist | Admitting: Specialist

## 2018-02-21 ENCOUNTER — Encounter: Payer: Self-pay | Admitting: Emergency Medicine

## 2018-02-21 ENCOUNTER — Other Ambulatory Visit: Payer: Self-pay

## 2018-02-21 ENCOUNTER — Emergency Department: Payer: Medicare Other

## 2018-02-21 DIAGNOSIS — Z9981 Dependence on supplemental oxygen: Secondary | ICD-10-CM | POA: Diagnosis not present

## 2018-02-21 DIAGNOSIS — E119 Type 2 diabetes mellitus without complications: Secondary | ICD-10-CM | POA: Diagnosis not present

## 2018-02-21 DIAGNOSIS — J9621 Acute and chronic respiratory failure with hypoxia: Secondary | ICD-10-CM | POA: Diagnosis present

## 2018-02-21 DIAGNOSIS — F17210 Nicotine dependence, cigarettes, uncomplicated: Secondary | ICD-10-CM | POA: Diagnosis present

## 2018-02-21 DIAGNOSIS — Z79899 Other long term (current) drug therapy: Secondary | ICD-10-CM | POA: Diagnosis not present

## 2018-02-21 DIAGNOSIS — J45901 Unspecified asthma with (acute) exacerbation: Secondary | ICD-10-CM | POA: Diagnosis not present

## 2018-02-21 DIAGNOSIS — E1142 Type 2 diabetes mellitus with diabetic polyneuropathy: Secondary | ICD-10-CM | POA: Diagnosis present

## 2018-02-21 DIAGNOSIS — J9601 Acute respiratory failure with hypoxia: Secondary | ICD-10-CM

## 2018-02-21 DIAGNOSIS — Z7984 Long term (current) use of oral hypoglycemic drugs: Secondary | ICD-10-CM | POA: Diagnosis not present

## 2018-02-21 DIAGNOSIS — Z885 Allergy status to narcotic agent status: Secondary | ICD-10-CM

## 2018-02-21 DIAGNOSIS — I10 Essential (primary) hypertension: Secondary | ICD-10-CM | POA: Diagnosis present

## 2018-02-21 DIAGNOSIS — Z720 Tobacco use: Secondary | ICD-10-CM | POA: Diagnosis not present

## 2018-02-21 DIAGNOSIS — J441 Chronic obstructive pulmonary disease with (acute) exacerbation: Principal | ICD-10-CM | POA: Diagnosis present

## 2018-02-21 DIAGNOSIS — R0602 Shortness of breath: Secondary | ICD-10-CM | POA: Diagnosis not present

## 2018-02-21 LAB — GLUCOSE, CAPILLARY
GLUCOSE-CAPILLARY: 232 mg/dL — AB (ref 65–99)
GLUCOSE-CAPILLARY: 237 mg/dL — AB (ref 65–99)
Glucose-Capillary: 277 mg/dL — ABNORMAL HIGH (ref 65–99)

## 2018-02-21 LAB — BASIC METABOLIC PANEL
ANION GAP: 10 (ref 5–15)
BUN: 21 mg/dL — AB (ref 6–20)
CHLORIDE: 99 mmol/L — AB (ref 101–111)
CO2: 31 mmol/L (ref 22–32)
Calcium: 8.6 mg/dL — ABNORMAL LOW (ref 8.9–10.3)
Creatinine, Ser: 1.41 mg/dL — ABNORMAL HIGH (ref 0.44–1.00)
GFR calc Af Amer: 48 mL/min — ABNORMAL LOW (ref >60)
GFR, EST NON AFRICAN AMERICAN: 42 mL/min — AB (ref >60)
Glucose, Bld: 213 mg/dL — ABNORMAL HIGH (ref 65–99)
POTASSIUM: 3.6 mmol/L (ref 3.5–5.1)
Sodium: 140 mmol/L (ref 135–145)

## 2018-02-21 LAB — CBC WITH DIFFERENTIAL/PLATELET
BASOS ABS: 0 10*3/uL (ref 0–0.1)
Basophils Relative: 1 %
EOS PCT: 4 %
Eosinophils Absolute: 0.4 10*3/uL (ref 0–0.7)
HCT: 45.2 % (ref 35.0–47.0)
HEMOGLOBIN: 14.8 g/dL (ref 12.0–16.0)
LYMPHS ABS: 3.9 10*3/uL — AB (ref 1.0–3.6)
LYMPHS PCT: 44 %
MCH: 29 pg (ref 26.0–34.0)
MCHC: 32.7 g/dL (ref 32.0–36.0)
MCV: 88.7 fL (ref 80.0–100.0)
Monocytes Absolute: 0.6 10*3/uL (ref 0.2–0.9)
Monocytes Relative: 7 %
NEUTROS PCT: 44 %
Neutro Abs: 4 10*3/uL (ref 1.4–6.5)
PLATELETS: 308 10*3/uL (ref 150–440)
RBC: 5.1 MIL/uL (ref 3.80–5.20)
RDW: 14.1 % (ref 11.5–14.5)
WBC: 8.9 10*3/uL (ref 3.6–11.0)

## 2018-02-21 MED ORDER — ONDANSETRON HCL 4 MG/2ML IJ SOLN: 4.0000 mg | Freq: Four times a day (QID) | INTRAMUSCULAR | Status: DC | PRN

## 2018-02-21 MED ORDER — GABAPENTIN 300 MG PO CAPS
300.0000 mg | ORAL_CAPSULE | Freq: Two times a day (BID) | ORAL | Status: DC
  Administered 2018-02-21 – 2018-02-22 (×3): 300 mg via ORAL
  Filled 2018-02-21 (×2): qty 1

## 2018-02-21 MED ORDER — IPRATROPIUM-ALBUTEROL 0.5-2.5 (3) MG/3ML IN SOLN: 3.0000 mL | RESPIRATORY_TRACT | Status: DC | PRN

## 2018-02-21 MED ORDER — IPRATROPIUM-ALBUTEROL 0.5-2.5 (3) MG/3ML IN SOLN
3.0000 mL | RESPIRATORY_TRACT | Status: DC
  Administered 2018-02-21 – 2018-02-22 (×8): 3 mL via RESPIRATORY_TRACT
  Filled 2018-02-21 (×7): qty 3

## 2018-02-21 MED ORDER — ACETAMINOPHEN 650 MG RE SUPP: 650.0000 mg | Freq: Four times a day (QID) | RECTAL | Status: DC | PRN

## 2018-02-21 MED ORDER — METHYLPREDNISOLONE SODIUM SUCC 125 MG IJ SOLR
60.0000 mg | Freq: Four times a day (QID) | INTRAMUSCULAR | Status: DC
  Administered 2018-02-21 – 2018-02-22 (×5): 60 mg via INTRAVENOUS
  Filled 2018-02-21 (×5): qty 2

## 2018-02-21 MED ORDER — NICOTINE 21 MG/24HR TD PT24
21.0000 mg | MEDICATED_PATCH | Freq: Every day | TRANSDERMAL | Status: DC
  Administered 2018-02-21 – 2018-02-22 (×2): 21 mg via TRANSDERMAL
  Filled 2018-02-21 (×2): qty 1

## 2018-02-21 MED ORDER — SODIUM CHLORIDE 0.9 % IV SOLN
500.0000 mg | Freq: Once | INTRAVENOUS | Status: AC
  Administered 2018-02-21: 500 mg via INTRAVENOUS
  Filled 2018-02-21: qty 500

## 2018-02-21 MED ORDER — BUDESONIDE 0.5 MG/2ML IN SUSP
0.5000 mg | Freq: Two times a day (BID) | RESPIRATORY_TRACT | Status: DC
  Administered 2018-02-21 – 2018-02-22 (×3): 0.5 mg via RESPIRATORY_TRACT
  Filled 2018-02-21 (×3): qty 2

## 2018-02-21 MED ORDER — ONDANSETRON HCL 4 MG PO TABS: 4.0000 mg | ORAL_TABLET | Freq: Four times a day (QID) | ORAL | Status: DC | PRN

## 2018-02-21 MED ORDER — INSULIN ASPART 100 UNIT/ML ~~LOC~~ SOLN
0.0000 [IU] | Freq: Every day | SUBCUTANEOUS | Status: DC
  Administered 2018-02-21: 2 [IU] via SUBCUTANEOUS
  Filled 2018-02-21: qty 1

## 2018-02-21 MED ORDER — LOSARTAN POTASSIUM 50 MG PO TABS
50.0000 mg | ORAL_TABLET | Freq: Every day | ORAL | Status: DC
  Administered 2018-02-21 – 2018-02-22 (×2): 50 mg via ORAL
  Filled 2018-02-21: qty 1

## 2018-02-21 MED ORDER — SODIUM CHLORIDE 0.9 % IV SOLN
1.0000 g | Freq: Once | INTRAVENOUS | Status: AC
  Administered 2018-02-21: 1 g via INTRAVENOUS
  Filled 2018-02-21: qty 10

## 2018-02-21 MED ORDER — IPRATROPIUM-ALBUTEROL 0.5-2.5 (3) MG/3ML IN SOLN
RESPIRATORY_TRACT | Status: AC
  Administered 2018-02-21: 08:00:00
  Filled 2018-02-21: qty 9

## 2018-02-21 MED ORDER — INSULIN ASPART 100 UNIT/ML ~~LOC~~ SOLN
0.0000 [IU] | Freq: Three times a day (TID) | SUBCUTANEOUS | Status: DC
  Administered 2018-02-21: 7 [IU] via SUBCUTANEOUS
  Administered 2018-02-21 – 2018-02-22 (×2): 11 [IU] via SUBCUTANEOUS
  Administered 2018-02-22: 3 [IU] via SUBCUTANEOUS
  Administered 2018-02-22: 11 [IU] via SUBCUTANEOUS
  Filled 2018-02-21 (×5): qty 1

## 2018-02-21 MED ORDER — ALPRAZOLAM 0.25 MG PO TABS
0.2500 mg | ORAL_TABLET | Freq: Three times a day (TID) | ORAL | Status: DC | PRN
  Administered 2018-02-21: 0.25 mg via ORAL
  Filled 2018-02-21: qty 1

## 2018-02-21 MED ORDER — ACETAMINOPHEN 325 MG PO TABS
650.0000 mg | ORAL_TABLET | Freq: Four times a day (QID) | ORAL | Status: DC | PRN
Start: 2018-02-21 — End: 2018-02-23

## 2018-02-21 MED ORDER — GUAIFENESIN-DM 100-10 MG/5ML PO SYRP: 5.0000 mL | ORAL_SOLUTION | ORAL | Status: DC | PRN

## 2018-02-21 MED ORDER — MONTELUKAST SODIUM 10 MG PO TABS
10.0000 mg | ORAL_TABLET | Freq: Every day | ORAL | Status: DC
  Administered 2018-02-21: 10 mg via ORAL
  Filled 2018-02-21: qty 1

## 2018-02-21 MED ORDER — LOSARTAN POTASSIUM-HCTZ 50-12.5 MG PO TABS: 1.0000 | ORAL_TABLET | Freq: Every day | ORAL | Status: DC

## 2018-02-21 MED ORDER — HYDROCHLOROTHIAZIDE 12.5 MG PO CAPS
12.5000 mg | ORAL_CAPSULE | Freq: Every day | ORAL | Status: DC
  Administered 2018-02-21 – 2018-02-22 (×2): 12.5 mg via ORAL
  Filled 2018-02-21 (×2): qty 1

## 2018-02-21 MED ORDER — IPRATROPIUM-ALBUTEROL 0.5-2.5 (3) MG/3ML IN SOLN: 3.0000 mL | Freq: Four times a day (QID) | RESPIRATORY_TRACT | Status: DC

## 2018-02-21 MED ORDER — ENOXAPARIN SODIUM 40 MG/0.4ML ~~LOC~~ SOLN
40.0000 mg | SUBCUTANEOUS | Status: DC
  Administered 2018-02-21: 40 mg via SUBCUTANEOUS
  Filled 2018-02-21: qty 0.4

## 2018-02-21 MED ORDER — IPRATROPIUM-ALBUTEROL 0.5-2.5 (3) MG/3ML IN SOLN
RESPIRATORY_TRACT | Status: AC
  Administered 2018-02-21: 3 mL
  Filled 2018-02-21: qty 3

## 2018-02-21 NOTE — ED Notes (Signed)
Patient is resting comfortably with eyes closed. VSS. Will continue to monitor for changes.

## 2018-02-21 NOTE — ED Provider Notes (Signed)
[facility] Emergency Department Provider Note  ____________________________________________  Time seen: Approximately 7:57 AM  I have reviewed the triage vital signs and the nursing notes.   HISTORY  Chief Complaint Shortness of Breath   HPI Samantha [name] is a 52 y.o. female with a history of asthma, COPD, smoking, diabetes, hypertension who presents for evaluation of shortness of breath.patient reports 1 week of productive cough and congestion. progressively worsening and constant shortness of breath in the last 24 hours which became severe this morning. She has been taking albuterol treatments overnight with no significant relief. Continues to smoke. no fever, chills, vomiting, diarrhea, chest pain. Patient called EMS this morning for severe shortness of breath. She was started on CPAP, received 2 breathing treatments in route, 2 g of magnesium, and 125 mg of Solu-Medrol.  Past Medical History:  Diagnosis Date  . Asthma   . COPD (chronic obstructive pulmonary disease) (HCC)   . Diabetes mellitus without complication (HCC)   . Hypertension     Patient Active Problem List   Diagnosis Date Noted  . Acute on chronic respiratory failure (HCC) 10/06/2015  . Acute on chronic respiratory failure with hypoxemia (HCC) 08/30/2015    Past Surgical History:  Procedure Laterality Date  . none      Prior to Admission medications   Medication Sig Start Date End Date Taking? Authorizing Provider  albuterol (PROVENTIL HFA;VENTOLIN HFA) 108 (90 Base) MCG/ACT inhaler Inhale 2 puffs into the lungs every 6 (six) hours as needed for wheezing or shortness of breath. 09/19/17  Yes Merrily Brittleifenbark, Neil, MD  budesonide-formoterol (SYMBICORT) 160-4.5 MCG/ACT inhaler Inhale 2 puffs 2 (two) times daily into the lungs. 10/30/17  Yes Emily FilbertWilliams, Jonathan E, MD  gabapentin (NEURONTIN) 300 MG capsule Take 300 mg by mouth 2 (two) times daily.   Yes [provider]    glimepiride (AMARYL) 4 MG tablet Take 4 mg by mouth daily.   Yes [provider]  losartan-hydrochlorothiazide (HYZAAR) 50-12.5 MG tablet Take 1 tablet by mouth daily. 02/22/16  Yes [provider]  metFORMIN (GLUCOPHAGE) 850 MG tablet Take 1 tablet by mouth 2 (two) times daily with a meal.  12/11/15  Yes [provider]  montelukast (SINGULAIR) 10 MG tablet Take 10 mg by mouth at bedtime.    Yes [provider]  predniSONE (DELTASONE) 10 MG tablet Take five tablets a day (50 mg) for 3 days,  Then four tablets a day (40 mg) for 3 days  Then three tablets a day (30 mg) for 3 days  Then two tablets a day (20 mg) for 5 days  Then one tablet a day (10 mg) for 5 days Patient not taking: Reported on 02/21/2018 12/19/17   Sharman CheekStafford, Phillip, MD    Allergies Percocet [oxycodone-acetaminophen]  Family History  Problem Relation Age of Onset  . Asthma Mother   . Asthma Sister     Social History Social History   Tobacco Use  . Smoking status: Current Every Day Smoker    Packs/day: 0.10    Years: 23.00    Pack years: 2.30    Types: Cigarettes  . Smokeless tobacco: Never Used  Substance Use Topics  . Alcohol use: Yes  . Drug use: No    Review of Systems  Constitutional: Negative for fever. Eyes: Negative for visual changes. ENT: Negative for sore throat. + congestion Neck: No neck pain  Cardiovascular: Negative for chest pain. Respiratory: + shortness of breath and cough Gastrointestinal: Negative for  abdominal pain, vomiting or diarrhea. Genitourinary: Negative for dysuria. Musculoskeletal: Negative for back pain. Skin: Negative for rash. Neurological: Negative for headaches, weakness or numbness. Psych: No SI or HI  ____________________________________________   PHYSICAL EXAM:  VITAL SIGNS: ED Triage Vitals  Enc Vitals Group     BP 02/21/18 0723 114/67     Pulse Rate 02/21/18 0723 95     Resp 02/21/18 0723 (!) 24     Temp 02/21/18  0723 97.9 F (36.6 C)     Temp Source 02/21/18 0723 Oral     SpO2 02/21/18 0723 100 %     Weight 02/21/18 0724 256 lb (116.1 kg)     Height 02/21/18 0724 5\' 7"  (1.702 m)     Head Circumference --      Peak Flow --      Pain Score --      Pain Loc --      Pain Edu? --      Excl. in GC? --     Constitutional: Alert and oriented, moderate respiratory distress.  HEENT:      Head: Normocephalic and atraumatic.         Eyes: Conjunctivae are normal. Sclera is non-icteric.       Mouth/Throat: Mucous membranes are moist.       Neck: Supple with no signs of meningismus. Cardiovascular: Regular rate and rhythm. No murmurs, gallops, or rubs. 2+ symmetrical distal pulses are present in all extremities. No JVD. Respiratory: moderate respiratory distress, severely diminished air movement with diffuse expiratory wheezes Gastrointestinal: Soft, non tender, and non distended with positive bowel sounds. No rebound or guarding. Musculoskeletal: Nontender with normal range of motion in all extremities. No edema, cyanosis, or erythema of extremities. Neurologic: Normal speech and language. Face is symmetric. Moving all extremities. No gross focal neurologic deficits are appreciated. Skin: Skin is warm, dry and intact. No rash noted. Psychiatric: Mood and affect are normal. Speech and behavior are normal.  ____________________________________________   LABS (all labs ordered are listed, but only abnormal results are displayed)  Labs Reviewed  CBC WITH DIFFERENTIAL/PLATELET - Abnormal; Notable for the following components:      Result Value   Lymphs Abs 3.9 (*)    All other components within normal limits  BASIC METABOLIC PANEL - Abnormal; Notable for the following components:   Chloride 99 (*)    Glucose, Bld 213 (*)    BUN 21 (*)    Creatinine, Ser 1.41 (*)    Calcium 8.6 (*)    GFR calc non Af Amer 42 (*)    GFR calc Af Amer 48 (*)    All other components within normal limits    ____________________________________________  EKG  ED ECG REPORT I, Nita Sickle, the attending physician, personally viewed and interpreted this ECG.  Normal sinus rhythm, rate of 94, normal intervals, normal axis, no ST elevations or depressions. unchanged from prior. ____________________________________________  RADIOLOGY  I have personally reviewed the images performed during this visit and I agree with the Radiologist's read.   Interpretation by Radiologist:  Dg Chest Portable 1 View  Result Date: 02/21/2018 CLINICAL DATA:  Shortness of Breath EXAM: PORTABLE CHEST 1 VIEW COMPARISON:  January 30, 2018 FINDINGS: There is no appreciable edema or consolidation. Heart size and pulmonary vascularity are normal. No adenopathy. No evident bone lesions. IMPRESSION: No edema or consolidation. Electronically Signed   By: Bretta Bang III M.D.   On: 02/21/2018 08:00     ____________________________________________  PROCEDURES  Procedure(s) performed: None Procedures Critical Care performed: YES  CRITICAL CARE Performed by: Nita Sickle  ?  Total critical care time: 35 min  Critical care time was exclusive of separately billable procedures and treating other patients.  Critical care was necessary to treat or prevent imminent or life-threatening deterioration.  Critical care was time spent personally by me on the following activities: development of treatment plan with patient and/or surrogate as well as nursing, discussions with consultants, evaluation of patient's response to treatment, examination of patient, obtaining history from patient or surrogate, ordering and performing treatments and interventions, ordering and review of laboratory studies, ordering and review of radiographic studies, pulse oximetry and re-evaluation of patient's condition.  ____________________________________________   INITIAL IMPRESSION / ASSESSMENT AND PLAN / ED COURSE   52  y.o. female with a history of asthma, COPD, smoking, diabetes, hypertension who presents for evaluation of shortness of breath in the setting of 1 week of cough and congestion. patient arrives in moderate respiratory distress, on CPAP, with severely diminished air movement and diffuse expiratory wheezes. Patient has received mag and Solu-Medrol per EMS. Patient was placed on BiPAP and started with 3 duo nebs. Chest x-ray pending to rule out pneumonia. No fever therefore less likely to be flu. Presentation concerning for asthma/COPD exacerbation.    _________________________ 8:54 AM on 02/21/2018 -----------------------------------------  Labs showing no evidence of leukocytosis, patient remains afebrile, chest x-ray with no evidence of pneumonia. BMP with no acute changes. Chest x-ray with no ischemia. This patient also has a history of COPD we'll treat for asthma/bronchitis with antibiotics and get her admitted since she continues to be on BiPAP.   As part of my medical decision making, I reviewed the following data within the electronic MEDICAL RECORD NUMBER Nursing notes reviewed and incorporated, Labs reviewed , EKG interpreted , Old EKG reviewed, Old chart reviewed, Radiograph reviewed , Discussed with admitting physician , Notes from prior ED visits and [place] Controlled Substance Database    Pertinent labs & imaging results that were available during my care of the patient were reviewed by me and considered in my medical decision making (see chart for details).    ____________________________________________   FINAL CLINICAL IMPRESSION(S) / ED DIAGNOSES  Final diagnoses:  Acute respiratory failure with hypoxia (HCC)  Exacerbation of asthma, unspecified asthma severity, unspecified whether persistent  COPD exacerbation (HCC)      NEW MEDICATIONS STARTED DURING THIS VISIT:  ED Discharge Orders    None       Note:  This document was prepared using Dragon voice recognition software  and may include unintentional dictation errors.    Don Perking, [place], MD 02/21/18 [phone]

## 2018-02-21 NOTE — Progress Notes (Signed)
Increased flow to 50 Lpm to meet pt demand and gave 1600 duoneb dose.

## 2018-02-21 NOTE — ED Triage Notes (Signed)
Pt has been taking back to back breathing treatments all night without relief. She called EMS this am. They put her on Bipap, gave her 2 duo neb treatments, Mag and 125mg  of Solumedrol. Resp and MD at bedside.

## 2018-02-21 NOTE — Progress Notes (Signed)
Pt c/o increasing SOB. Lung sounds still diminished with some slight wheezing. Oxygen sats 97% on HFNC. Respiratory at the bedside who increased his high flow rate to 50%. SVN also given. Will continue to assess.

## 2018-02-21 NOTE — ED Notes (Signed)
Pt eating graham cracker and drinking Ginger ale and talking on her cell phone. Denies pain.

## 2018-02-21 NOTE — ED Notes (Signed)
Pt taken off of Bipap and placed on High flow. Pt tolerating well at present time. Pt given graham crackers and gingerale.

## 2018-02-21 NOTE — H&P (Signed)
[facility] [place] [facility]   PATIENT NAME: Samantha [name]    MR#:  [id number]  DATE OF BIRTH:  [birth year]  DATE OF ADMISSION:  02/21/2018  PRIMARY C[facility] PHYSICIAN[facility]   REQUESTING/REFERRING PHYSICIAN: Dr. Cecil Cobbsaroline Veronese  CHIEF COMPLAINT:   Chief Complaint  Patient presents with  . Shortness of Breath    HISTORY OF PRESENT ILLNESS:  Samantha [name]  is a 52 y.o. female with a known history of Asthma/COPD, diabetes, hypertension, ongoing tobacco abuse, substance abuse who presents to the hospital due to shortness of breath. Patient has chronic respiratory failure and is on oxygen at home but for the past 4 days or shortness of breath has progressively getting worse. She has shortness of breath even on minimal exertion and with activities of daily living, she admits to a cough which is productive with yellow sputum, but no fevers, chills, chest pain, nausea or vomiting. Patient presented to the hospital and was noted to be in acute on chronic respiratory failure with hypoxia secondary to COPD exacerbation and hospitalist services were contacted further treatment and evaluation. Initially patient was placed on BiPAP but now has been weaned off of it and is currently on high flow nasal cannula.  PAST MEDICAL HISTORY:   Past Medical History:  Diagnosis Date  . Asthma   . COPD (chronic obstructive pulmonary disease) (HCC)   . Diabetes mellitus without complication (HCC)   . Hypertension     PAST SURGICAL HISTORY:   Past Surgical History:  Procedure Laterality Date  . none      SOCIAL HISTORY:   Social History   Tobacco Use  . Smoking status: Current Every Day Smoker    Packs/day: 0.10    Years: 23.00    Pack years: 2.30    Types: Cigarettes  . Smokeless tobacco: Never Used  Substance Use Topics  . Alcohol use: Yes    FAMILY HISTORY:   Family History  Problem Relation Age of Onset  . Asthma Mother   .  Diabetes Mother   . Hypertension Mother   . Asthma Sister   . Heart attack Father     DRUG ALLERGIES:   Allergies  Allergen Reactions  . Percocet [Oxycodone-Acetaminophen] Itching    REVIEW OF SYSTEMS:   Review of Systems  Constitutional: Negative for fever and weight loss.  HENT: Negative for congestion, nosebleeds and tinnitus.   Eyes: Negative for blurred vision, double vision and redness.  Respiratory: Positive for cough, sputum production, shortness of breath and wheezing. Negative for hemoptysis.   Cardiovascular: Negative for chest pain, orthopnea, leg swelling and PND.  Gastrointestinal: Negative for abdominal pain, diarrhea, melena, nausea and vomiting.  Genitourinary: Negative for dysuria, hematuria and urgency.  Musculoskeletal: Negative for falls and joint pain.  Neurological: Negative for dizziness, tingling, sensory change, focal weakness, seizures, weakness and headaches.  Endo/Heme/Allergies: Negative for polydipsia. Does not bruise/bleed easily.  Psychiatric/Behavioral: Negative for depression and memory loss. The patient is not nervous/anxious.     MEDICATIONS AT HOME:   Prior to Admission medications   Medication Sig Start Date End Date Taking? Authorizing Provider  albuterol (PROVENTIL HFA;VENTOLIN HFA) 108 (90 Base) MCG/ACT inhaler Inhale 2 puffs into the lungs every 6 (six) hours as needed for wheezing or shortness of breath. 09/19/17  Yes Merrily Brittleifenbark, Neil, MD  budesonide-formoterol (SYMBICORT) 160-4.5 MCG/ACT inhaler Inhale 2 puffs 2 (two) times daily into the lungs. 10/30/17  Yes Emily FilbertWilliams, Jonathan E, MD  gabapentin (NEURONTIN)  300 MG capsule Take 300 mg by mouth 2 (two) times daily.   Yes [provider]  glimepiride (AMARYL) 4 MG tablet Take 4 mg by mouth daily.   Yes [provider]  losartan-hydrochlorothiazide (HYZAAR) 50-12.5 MG tablet Take 1 tablet by mouth daily. 02/22/16  Yes [provider]  metFORMIN (GLUCOPHAGE) 850 MG  tablet Take 1 tablet by mouth 2 (two) times daily with a meal.  12/11/15  Yes [provider]  montelukast (SINGULAIR) 10 MG tablet Take 10 mg by mouth at bedtime.    Yes [provider]  predniSONE (DELTASONE) 10 MG tablet Take five tablets a day (50 mg) for 3 days,  Then four tablets a day (40 mg) for 3 days  Then three tablets a day (30 mg) for 3 days  Then two tablets a day (20 mg) for 5 days  Then one tablet a day (10 mg) for 5 days Patient not taking: Reported on 02/21/2018 12/19/17   Sharman Cheek, MD      VITAL SIGNS:  Blood pressure 130/71, pulse 95, temperature 97.9 F (36.6 C), temperature source Oral, resp. rate (!) 23, height 5\' 7"  (1.702 m), weight 116.1 kg (256 lb), SpO2 98 %.  PHYSICAL EXAMINATION:  Physical Exam  GENERAL:  52 y.o.-year-old patient lying in the bed in mild respiratory distress on high flow nasal cannula. EYES: Pupils equal, round, reactive to light and accommodation. No scleral icterus. Extraocular muscles intact.  HEENT: Head atraumatic, normocephalic. Oropharynx and nasopharynx clear. No oropharyngeal erythema, moist oral mucosa  NECK:  Supple, no jugular venous distention. No thyroid enlargement, no tenderness.  LUNGS: Good air entry bilaterally, diffuse rhonchi, wheezing bilaterally, no dullness to percussion, positive use of accessory muscles. CARDIOVASCULAR: S1, S2 RRR. No murmurs, rubs, gallops, clicks.  ABDOMEN: Soft, nontender, nondistended. Bowel sounds present. No organomegaly or mass.  EXTREMITIES: No pedal edema, cyanosis, or clubbing. + 2 pedal & radial pulses b/l.   NEUROLOGIC: Cranial nerves II through XII are intact. No focal Motor or sensory deficits appreciated b/l PSYCHIATRIC: The patient is alert and oriented x 3.  SKIN: No obvious rash, lesion, or ulcer.   LABORATORY PANEL:   CBC Recent Labs  Lab 02/21/18 0727  WBC 8.9  HGB 14.8  HCT 45.2  PLT 308    ------------------------------------------------------------------------------------------------------------------  Chemistries  Recent Labs  Lab 02/21/18 0727  NA 140  K 3.6  CL 99*  CO2 31  GLUCOSE 213*  BUN 21*  CREATININE 1.41*  CALCIUM 8.6*   ------------------------------------------------------------------------------------------------------------------  Cardiac Enzymes No results for input(s): TROPONINI in the last 168 hours. ------------------------------------------------------------------------------------------------------------------  RADIOLOGY:  Dg Chest Portable 1 View  Result Date: 02/21/2018 CLINICAL DATA:  Shortness of Breath EXAM: PORTABLE CHEST 1 VIEW COMPARISON:  January 30, 2018 FINDINGS: There is no appreciable edema or consolidation. Heart size and pulmonary vascularity are normal. No adenopathy. No evident bone lesions. IMPRESSION: No edema or consolidation. Electronically Signed   By: Bretta Bang III M.D.   On: 02/21/2018 08:00     IMPRESSION AND PLAN:   52 year old female with past medical history of hypertension, diabetes, asthma, COPD, ongoing tobacco abuse, substance abuse who presents to the hospital due to shortness of breath and noted to be in acute on chronic respiratory failure with hypoxia.  1. Acute on chronic respiratory failure with hypoxia-secondary to asthma/COPD exacerbation. -This is secondary to patient's ongoing tobacco abuse and substance abuse. Patient's chest x-ray is negative for any acute pneumonia. -We'll treat patient with IV  steroids, scheduled DuoNeb's, Pulmicort nebs.  2. COPD exacerbation-secondary to ongoing tobacco abuse and also substance abuse. Chest x-ray is negative for acute pneumonia. -Patient initially was on BiPAP but currently on high flow nasal cannula. We'll treat the patient with IV steroids, scheduled DuoNeb's, Pulmicort nebs. He was some antitussives and anxiolytics -Patient is on oxygen at  home  3. Diabetes type 2 without complication-hold patient's metformin, Mepron. Place patient on sliding scale insulin for now.  4. Essential hypertension-continue losartan/HCTZ.  5. Diabetic neuropathy-continue gabapentin.  6. Tobacco abuse-place patient on nicotine patch.    All the records are reviewed and case discussed with ED provider. Management plans discussed with the patient, family and they are in agreement.  CODE STATUS: Full code  TOTAL TIME TAKING CARE OF THIS PATIENT: 45 minutes.    Houston Siren M.D on 02/21/2018 at 9:49 AM  Between 7am to 6pm - Pager - [phone]  After 6pm go to [email or web address] - password [email or web address] [facility]  [place] [facility]  Office  [phone]  CC: Primary care physician; [facility]

## 2018-02-21 NOTE — ED Notes (Signed)
Pt is taken to floor via stretcher. VSS. NAD. [facility]. All questions answered.

## 2018-02-21 NOTE — ED Notes (Signed)
Patient getting portable CXR at this time.

## 2018-02-22 DIAGNOSIS — I10 Essential (primary) hypertension: Secondary | ICD-10-CM | POA: Diagnosis not present

## 2018-02-22 DIAGNOSIS — E119 Type 2 diabetes mellitus without complications: Secondary | ICD-10-CM | POA: Diagnosis not present

## 2018-02-22 DIAGNOSIS — F17210 Nicotine dependence, cigarettes, uncomplicated: Secondary | ICD-10-CM | POA: Diagnosis not present

## 2018-02-22 DIAGNOSIS — E1142 Type 2 diabetes mellitus with diabetic polyneuropathy: Secondary | ICD-10-CM | POA: Diagnosis not present

## 2018-02-22 DIAGNOSIS — J441 Chronic obstructive pulmonary disease with (acute) exacerbation: Secondary | ICD-10-CM | POA: Diagnosis not present

## 2018-02-22 DIAGNOSIS — Z885 Allergy status to narcotic agent status: Secondary | ICD-10-CM | POA: Diagnosis not present

## 2018-02-22 DIAGNOSIS — Z720 Tobacco use: Secondary | ICD-10-CM | POA: Diagnosis not present

## 2018-02-22 DIAGNOSIS — J9621 Acute and chronic respiratory failure with hypoxia: Secondary | ICD-10-CM | POA: Diagnosis not present

## 2018-02-22 LAB — CBC
HEMATOCRIT: 44.6 % (ref 35.0–47.0)
Hemoglobin: 14.6 g/dL (ref 12.0–16.0)
MCH: 29.4 pg (ref 26.0–34.0)
MCHC: 32.8 g/dL (ref 32.0–36.0)
MCV: 89.6 fL (ref 80.0–100.0)
Platelets: 311 10*3/uL (ref 150–440)
RBC: 4.98 MIL/uL (ref 3.80–5.20)
RDW: 13.9 % (ref 11.5–14.5)
WBC: 9.8 10*3/uL (ref 3.6–11.0)

## 2018-02-22 LAB — GLUCOSE, CAPILLARY
GLUCOSE-CAPILLARY: 254 mg/dL — AB (ref 65–99)
GLUCOSE-CAPILLARY: 170 mg/dL — AB (ref 65–99)
Glucose-Capillary: 268 mg/dL — ABNORMAL HIGH (ref 65–99)

## 2018-02-22 LAB — BASIC METABOLIC PANEL
ANION GAP: 9 (ref 5–15)
BUN: 30 mg/dL — ABNORMAL HIGH (ref 6–20)
CHLORIDE: 103 mmol/L (ref 101–111)
CO2: 24 mmol/L (ref 22–32)
Calcium: 9.2 mg/dL (ref 8.9–10.3)
Creatinine, Ser: 1.42 mg/dL — ABNORMAL HIGH (ref 0.44–1.00)
GFR calc Af Amer: 48 mL/min — ABNORMAL LOW (ref >60)
GFR, EST NON AFRICAN AMERICAN: 41 mL/min — AB (ref >60)
GLUCOSE: 306 mg/dL — AB (ref 65–99)
Potassium: 4.1 mmol/L (ref 3.5–5.1)
Sodium: 136 mmol/L (ref 135–145)

## 2018-02-22 MED ORDER — INSULIN GLARGINE 100 UNIT/ML ~~LOC~~ SOLN
10.0000 [IU] | Freq: Every day | SUBCUTANEOUS | Status: DC
  Filled 2018-02-22: qty 0.1

## 2018-02-22 MED ORDER — METHYLPREDNISOLONE SODIUM SUCC 125 MG IJ SOLR
60.0000 mg | Freq: Two times a day (BID) | INTRAMUSCULAR | Status: DC
Start: 2018-02-22 — End: 2018-02-23

## 2018-02-22 MED ORDER — PREDNISONE 10 MG PO TABS: ORAL_TABLET | ORAL | 0 refills | Status: DC

## 2018-02-22 MED ORDER — GUAIFENESIN-DM 100-10 MG/5ML PO SYRP: 5.0000 mL | ORAL_SOLUTION | ORAL | 0 refills | Status: DC | PRN

## 2018-02-22 NOTE — Progress Notes (Signed)
Inpatient Diabetes Program Recommendations  AACE/ADA: New Consensus Statement on Inpatient Glycemic Control (2015)  Target Ranges:  Prepandial:   less than 140 mg/dL      Peak postprandial:   less than 180 mg/dL (1-2 hours)      Critically ill patients:  140 - 180 mg/dL   Results for [facility] Center[facility]ONES, Gabrelle A (MRN [id number]) as of 02/22/2018 10:16  Ref. Range 02/21/2018 11:58 02/21/2018 16:41 02/21/2018 21:17 02/22/2018 07:46  Glucose-Capillary Latest Ref Range: 65 - 99 mg/dL 409277 (H) 811232 (H) 914237 (H) 268 (H)   Review of Glycemic Control  Diabetes history: DM2 Outpatient Diabetes medications: Amaryl 4 mg daily, Metformin 1000 mg BID Current orders for Inpatient glycemic control: Novolog 0-20 units TID with meals, Novolog 0-5 units QHS; Solumedrol 60 mg Q6H  Inpatient Diabetes Program Recommendations: Insulin - Basal: If steroids are continued, please consider ordering Lantus 10 units Q24H. Please note that if Lantus is ordered as recommended, as steroids are tapered Lantus will also need to be adjusted.  Insulin - Meal Coverage: If steroids are continued, please consider ordering Novolog 3 units TID with meals for meal coverage if patient eats at least 50% of meals. A1C: Please consider ordering an A1C to evaluate glycemic control over the past 2-3 months.  Thanks, Orlando PennerMarie Corley Kohls, RN, MSN, CDE Diabetes Coordinator Inpatient Diabetes Program [phone] (Team Pager from 8am to 5pm)

## 2018-02-22 NOTE — Plan of Care (Signed)
Pt with improving SOB. Saturations have been normal on room air. IV steroids and SVN given.

## 2018-02-22 NOTE — Progress Notes (Signed)
Pt received discharge orders per MD. Primary nurse reviewed discharge instructions with pt with all questions answered. Pt was escorted out via wheelchair.

## 2018-02-22 NOTE — Progress Notes (Signed)
SATURATION QUALIFICATIONS: (This note is used to comply with regulatory documentation for home oxygen)  Patient Saturations on Room Air at Rest = 95%  Patient Saturations on Room Air while Ambulating = 91%  Patient Saturations on  Liters of oxygen while Ambulating =   Please briefly explain why patient needs home oxygen: no needs.

## 2018-02-22 NOTE — Care Management (Signed)
RNCM confirmed with Morrie SheldonAshley from [facility] that patient's  Home O2 is nocturnal only

## 2018-02-22 NOTE — Progress Notes (Signed)
[facility] - [place] at [facility]   PATIENT NAME: Capricia Serda    MR#:  [id number]  DATE OF BIRTH:  1966/02/09  SUBJECTIVE:   Patient here due to shortness of breath and acute respiratory failure with hypoxia secondary to COPD exacerbation. Much improved today and weaned off high flow nasal cannula. Still having significant shortness of breath on exertion.  REVIEW OF SYSTEMS:    Review of Systems  Constitutional: Negative for chills and fever.  HENT: Negative for congestion and tinnitus.   Eyes: Negative for blurred vision and double vision.  Respiratory: Positive for cough, shortness of breath and wheezing.   Cardiovascular: Negative for chest pain, orthopnea and PND.  Gastrointestinal: Negative for abdominal pain, diarrhea, nausea and vomiting.  Genitourinary: Negative for dysuria and hematuria.  Neurological: Negative for dizziness, sensory change and focal weakness.  All other systems reviewed and are negative.   Nutrition: Heart Healthy/Carb modified Tolerating Diet: Yes Tolerating PT: Ambulatory  DRUG ALLERGIES:   Allergies  Allergen Reactions  . Percocet [Oxycodone-Acetaminophen] Itching    VITALS:  Blood pressure (!) 162/96, pulse 92, temperature 97.8 F (36.6 C), temperature source Oral, resp. rate 20, height 5\' 7"  (1.702 m), weight 116.1 kg (256 lb), SpO2 97 %.  PHYSICAL EXAMINATION:   Physical Exam  GENERAL:  52 y.o.-year-old obese patient lying in bed in mild Resp. Distress.   EYES: Pupils equal, round, reactive to light and accommodation. No scleral icterus. Extraocular muscles intact.  HEENT: Head atraumatic, normocephalic. Oropharynx and nasopharynx clear.  NECK:  Supple, no jugular venous distention. No thyroid enlargement, no tenderness.  LUNGS: Good A/E b/l, minimal end-exp. Wheezing b/l, No rales, rhonchi. No use of accessory muscles of respiration.  CARDIOVASCULAR: S1, S2 normal. No murmurs, rubs, or gallops.  ABDOMEN: Soft,  nontender, nondistended. Bowel sounds present. No organomegaly or mass.  EXTREMITIES: No cyanosis, clubbing or edema b/l.    NEUROLOGIC: Cranial nerves II through XII are intact. No focal Motor or sensory deficits b/l.   PSYCHIATRIC: The patient is alert and oriented x 3.  SKIN: No obvious rash, lesion, or ulcer.    LABORATORY PANEL:   CBC Recent Labs  Lab 02/22/18 0413  WBC 9.8  HGB 14.6  HCT 44.6  PLT 311   ------------------------------------------------------------------------------------------------------------------  Chemistries  Recent Labs  Lab 02/22/18 0413  NA 136  K 4.1  CL 103  CO2 24  GLUCOSE 306*  BUN 30*  CREATININE 1.42*  CALCIUM 9.2   ------------------------------------------------------------------------------------------------------------------  Cardiac Enzymes No results for input(s): TROPONINI in the last 168 hours. ------------------------------------------------------------------------------------------------------------------  RADIOLOGY:  Dg Chest Portable 1 View  Result Date: 02/21/2018 CLINICAL DATA:  Shortness of Breath EXAM: PORTABLE CHEST 1 VIEW COMPARISON:  January 30, 2018 FINDINGS: There is no appreciable edema or consolidation. Heart size and pulmonary vascularity are normal. No adenopathy. No evident bone lesions. IMPRESSION: No edema or consolidation. Electronically Signed   By: Bretta Bang III M.D.   On: 02/21/2018 08:00     ASSESSMENT AND PLAN:   52 year old female with past medical history of hypertension, diabetes, asthma, COPD, ongoing tobacco abuse, substance abuse who presents to the hospital due to shortness of breath and noted to be in acute on chronic respiratory failure with hypoxia.  1. Acute on chronic respiratory failure with hypoxia-secondary to asthma/COPD exacerbation. -This is secondary to patient's ongoing tobacco abuse and substance abuse. Patient's chest x-ray is negative for any acute pneumonia. -  cont.  IV steroids but will taper, cont. scheduled  DuoNeb's, Pulmicort nebs. Improved since yesterday.   2. COPD exacerbation-secondary to ongoing tobacco abuse and also substance abuse. Chest x-ray is negative for acute pneumonia. - Weaned off Hiflo Timpson now.  Cont. IV steroids but will taper, cont. scheduled DuoNeb's, Pulmicort nebs.  - cont. Anti-tussives. Improving.  -Patient is on oxygen at home  3. Diabetes type 2 without complication-hold patient's metformin, Glipizide. - cont. SSI and follow BS   4. Essential hypertension-continue losartan/HCTZ.  5. Diabetic neuropathy-continue gabapentin.  6. Tobacco abuse- cont. Nicotine patch.        All the records are reviewed and case discussed with Care Management/Social Worker. Management plans discussed with the patient, family and they are in agreement.  CODE STATUS: Full code  DVT Prophylaxis: Lovenox  TOTAL TIME TAKING CARE OF THIS PATIENT: 30 minutes.   POSSIBLE D/C IN 1-2 DAYS, DEPENDING ON CLINICAL CONDITION.   Houston SirenSAINANI,VIVEK J M.D on 02/22/2018 at 2:24 PM  Between 7am to 6pm - Pager - [phone]  After 6pm go to [email or web address] - Therapist, nutritionalpassword EPAS [facility]  [facility] [place] [facility]  Office  [phone]  CC: Primary care physician; [facility]

## 2018-02-23 LAB — HIV ANTIBODY (ROUTINE TESTING W REFLEX): HIV Screen 4th Generation wRfx: NONREACTIVE

## 2018-02-23 NOTE — Discharge Summary (Signed)
[facility] - [place] at [facility]   PATIENT NAME: Samantha [name]    MR#:  [id number]  DATE OF BIRTH:  September 24, 1966  DATE OF ADMISSION:  02/21/2018 ADMITTING PHYSICIAN: Houston Siren, MD  DATE OF DISCHARGE: 02/22/2018 10:38 PM  PRIMARY CARE PHYSICIAN: [facility]    ADMISSION DIAGNOSIS:  COPD exacerbation (HCC) [J44.1] Acute respiratory failure with hypoxia (HCC) [J96.01] Exacerbation of asthma, unspecified asthma severity, unspecified whether persistent [J45.901]  DISCHARGE DIAGNOSIS:  Active Problems:   COPD exacerbation (HCC)   SECONDARY DIAGNOSIS:   Past Medical History:  Diagnosis Date  . Asthma   . COPD (chronic obstructive pulmonary disease) (HCC)   . Diabetes mellitus without complication (HCC)   . Hypertension     HOSPITAL COURSE:   52 year old female with past medical history of hypertension, diabetes, asthma, COPD, ongoing tobacco abuse, substance abuse who presents to the hospital due to shortness of breath and noted to be in acute on chronic respiratory failure with hypoxia.  1. Acute on chronic respiratory failure with hypoxia-secondary to asthma/COPD exacerbation. -This is secondary to patient's ongoing tobacco abuse and substance abuse. Patient's chest x-ray was negative for any acute pneumonia. -patient was treated with IV steroids, scheduled DuoNeb's, Pulmicort nebs. She has improved now and therefore being discharged on oral prednisone taper and antitussives.   2. COPD exacerbation-secondary to ongoing tobacco abuse and also substance abuse. Chest x-ray was negative for acute pneumonia. -initially in the ER patient was on BiPAP and weaned off the high flow nasal cannula now back down to her baseline nasal cannula. She was treated with IV steroids, scheduled DuoNeb's and Pulmicort nebs and has significantly improved. She is now being discharged on oral prednisone taper. She was strongly advised to quit  smoking.  3. Diabetes type 2 without complication-on the hospital patient was on sliding scale insulin, now will resume her metformin and glipizide upon discharge.  4. Essential hypertension- pt. Will continue losartan/HCTZ.  5. Diabetic neuropathy- pt. Will continue gabapentin.   DISCHARGE CONDITIONS:   Stable.   CONSULTS OBTAINED:    DRUG ALLERGIES:   Allergies  Allergen Reactions  . Percocet [Oxycodone-Acetaminophen] Itching    DISCHARGE MEDICATIONS:   Allergies as of 02/22/2018      Reactions   Percocet [oxycodone-acetaminophen] Itching      Medication List    TAKE these medications   albuterol 108 (90 Base) MCG/ACT inhaler Commonly known as:  PROVENTIL HFA;VENTOLIN HFA Inhale 2 puffs into the lungs every 6 (six) hours as needed for wheezing or shortness of breath.   budesonide-formoterol 160-4.5 MCG/ACT inhaler Commonly known as:  SYMBICORT Inhale 2 puffs 2 (two) times daily into the lungs.   gabapentin 300 MG capsule Commonly known as:  NEURONTIN Take 300 mg by mouth 2 (two) times daily.   glimepiride 4 MG tablet Commonly known as:  AMARYL Take 4 mg by mouth daily.   guaiFENesin-dextromethorphan 100-10 MG/5ML syrup Commonly known as:  ROBITUSSIN DM Take 5 mLs by mouth every 4 (four) hours as needed for cough.   losartan-hydrochlorothiazide 50-12.5 MG tablet Commonly known as:  HYZAAR Take 1 tablet by mouth daily.   metFORMIN 850 MG tablet Commonly known as:  GLUCOPHAGE Take 1 tablet by mouth 2 (two) times daily with a meal.   montelukast 10 MG tablet Commonly known as:  SINGULAIR Take 10 mg by mouth at bedtime.   predniSONE 10 MG tablet Commonly known as:  DELTASONE Label  & dispense according to the  schedule below. 5 Pills PO for 1 day then, 4 Pills PO for 1 day, 3 Pills PO for 1 day, 2 Pills PO for 1 day, 1 Pill PO for 1 days then STOP. What changed:  additional instructions         DISCHARGE INSTRUCTIONS:   DIET:  Cardiac diet  and Diabetic diet  DISCHARGE CONDITION:  Stable  ACTIVITY:  Activity as tolerated  OXYGEN:  Home Oxygen: No.   Oxygen Delivery: 2 liters/min via Patient connected to nasal cannula oxygen  DISCHARGE LOCATION:  home   If you experience worsening of your admission symptoms, develop shortness of breath, life threatening emergency, suicidal or homicidal thoughts you must seek medical attention immediately by calling 911 or calling your MD immediately  if symptoms less severe.  You Must read complete instructions/literature along with all the possible adverse reactions/side effects for all the Medicines you take and that have been prescribed to you. Take any new Medicines after you have completely understood and accpet all the possible adverse reactions/side effects.   Please note  You were cared for by a hospitalist during your hospital stay. If you have any questions about your discharge medications or the care you received while you were in the hospital after you are discharged, you can call the unit and asked to speak with the hospitalist on call if the hospitalist that took care of you is not available. Once you are discharged, your primary care physician will handle any further medical issues. Please note that NO REFILLS for any discharge medications will be authorized once you are discharged, as it is imperative that you return to your primary care physician (or establish a relationship with a primary care physician if you do not have one) for your aftercare needs so that they can reassess your need for medications and monitor your lab values.    DATA REVIEW:   CBC Recent Labs  Lab 02/22/18 0413  WBC 9.8  HGB 14.6  HCT 44.6  PLT 311    Chemistries  Recent Labs  Lab 02/22/18 0413  NA 136  K 4.1  CL 103  CO2 24  GLUCOSE 306*  BUN 30*  CREATININE 1.42*  CALCIUM 9.2    Cardiac Enzymes No results for input(s): TROPONINI in the last 168 hours.  Microbiology Results   Results for orders placed or performed during the hospital encounter of 12/04/17  Group A Strep by PCR ([facility] Only)     Status: None   Collection Time: 12/04/17  1:13 PM  Result Value Ref Range Status   Group A Strep by PCR NOT DETECTED NOT DETECTED Final    Comment: Performed at [facility], [address] [place], [place] [address]    RADIOLOGY:  No results found.    Management plans discussed with the patient, family and they are in agreement.  CODE STATUS:  Code Status History    Date Active Date Inactive Code Status Order ID Comments User Context   02/21/2018 11:26 02/23/2018 01:38 Full Code [id number]  Houston SirenSainani, Vivek J, MD Inpatient    TOTAL TIME TAKING CARE OF THIS PATIENT: 40 minutes.    Houston SirenSAINANI,VIVEK J M.D on 02/23/2018 at 4:50 PM  Between 7am to 6pm - Pager - [phone]  After 6pm go to [email or web address] - Therapist, nutritionalpassword EPAS [facility]  [facility] [place] [facility]  Office  [phone]  CC: Primary care physician; [facility]

## 2018-05-15 ENCOUNTER — Telehealth: Payer: Self-pay

## 2018-05-15 NOTE — Telephone Encounter (Signed)
Per Medicare Wellness, left message informing patient that we are no longer her PCP, requesting her current PCP, asked her to inform her insurance and call us back so we can remove her from our list

## 2018-05-24 DIAGNOSIS — K439 Ventral hernia without obstruction or gangrene: Secondary | ICD-10-CM | POA: Diagnosis not present

## 2018-05-24 DIAGNOSIS — J441 Chronic obstructive pulmonary disease with (acute) exacerbation: Secondary | ICD-10-CM | POA: Diagnosis not present

## 2018-05-24 DIAGNOSIS — R7309 Other abnormal glucose: Secondary | ICD-10-CM | POA: Diagnosis not present

## 2018-06-21 IMAGING — DX DG CHEST 1V PORT
1 series · 1 of 1 positions shown · non-contrast
Comparison: 08/27/2016

CLINICAL DATA: Shortness of Breath

EXAM:
PORTABLE CHEST 1 VIEW

[chest ap]
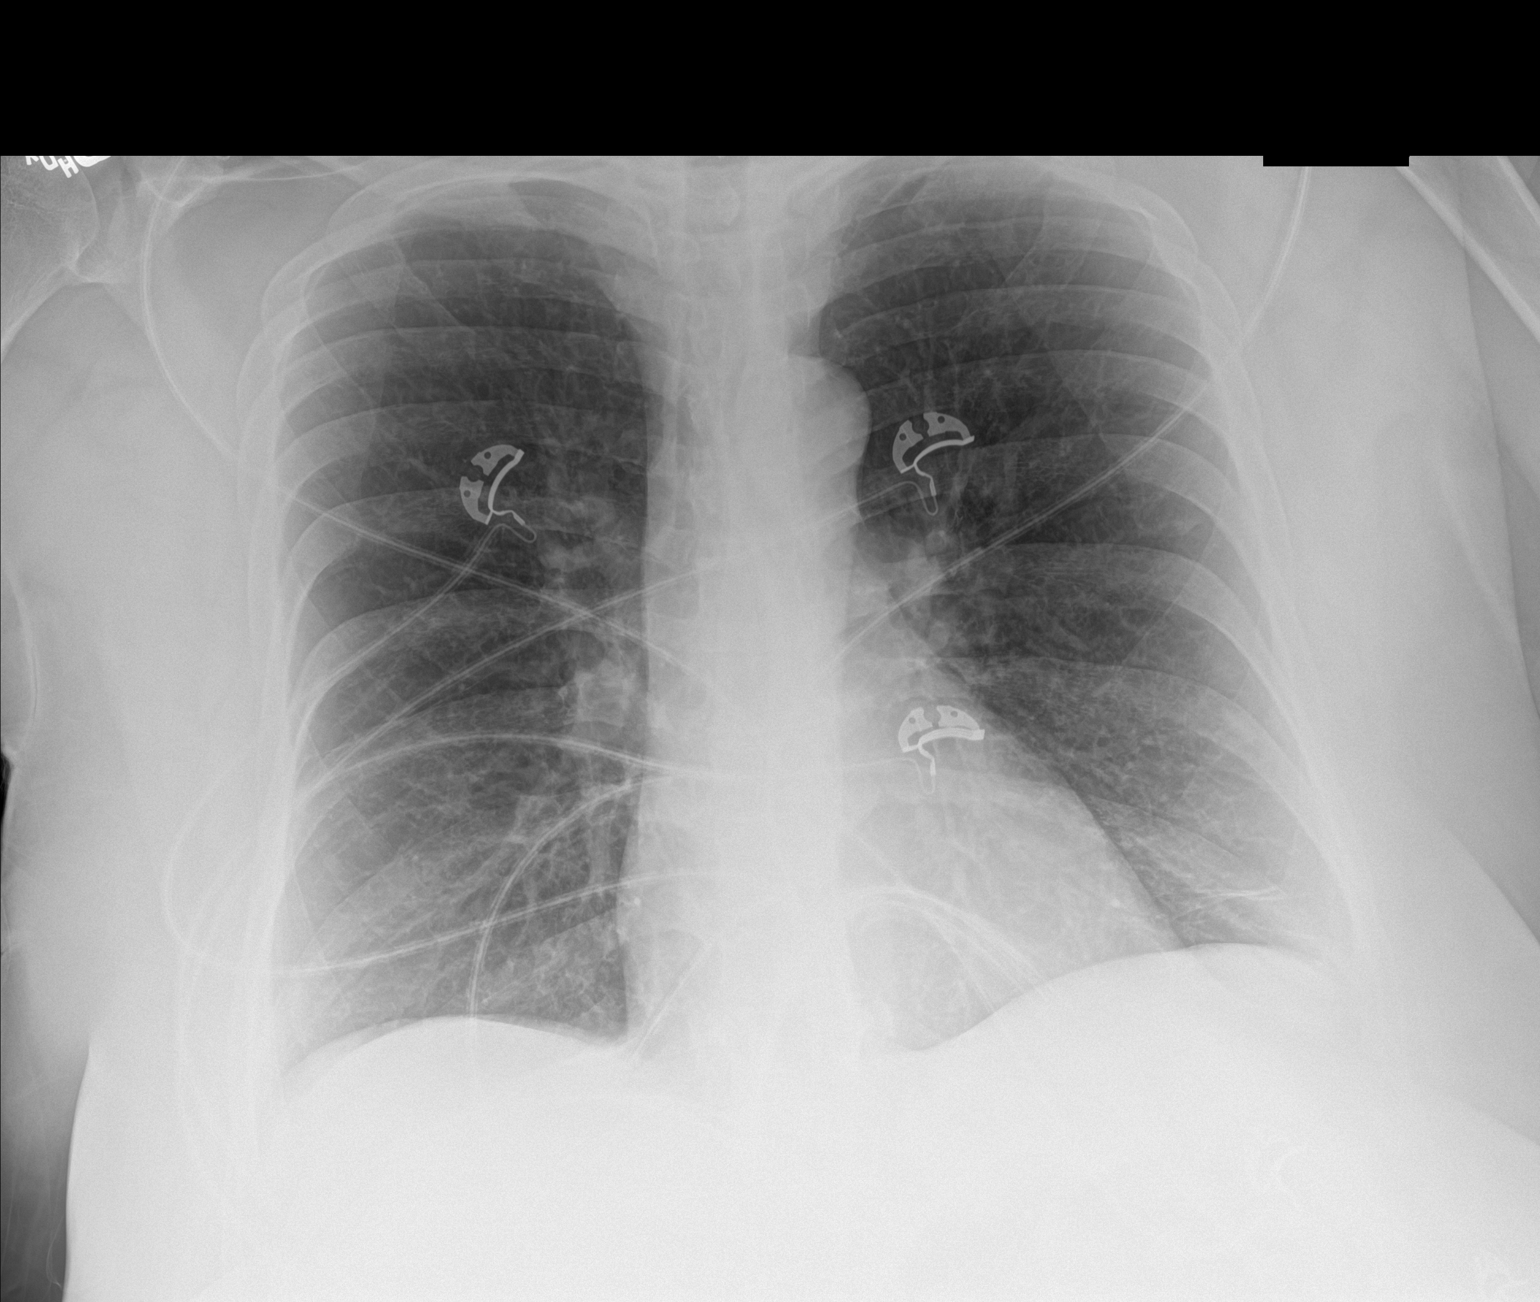

[1 of 1 positions shown; findings below may reference images not displayed]

FINDINGS: The heart size and mediastinal contours are within normal limits.
Both lungs are clear. The visualized skeletal structures are
unremarkable.
IMPRESSION: No active disease.

## 2018-06-27 ENCOUNTER — Other Ambulatory Visit: Payer: Self-pay

## 2018-06-27 ENCOUNTER — Emergency Department: Payer: Medicare Other

## 2018-06-27 ENCOUNTER — Emergency Department
Admission: EM | Admit: 2018-06-27 | Discharge: 2018-06-27 | Disposition: A | Discharge status: Home / Self Care | Payer: Medicare Other | Attending: Student in an Organized Health Care Education/Training Program | Admitting: Student in an Organized Health Care Education/Training Program

## 2018-06-27 DIAGNOSIS — R0602 Shortness of breath: Secondary | ICD-10-CM | POA: Diagnosis not present

## 2018-06-27 DIAGNOSIS — I10 Essential (primary) hypertension: Secondary | ICD-10-CM | POA: Insufficient documentation

## 2018-06-27 DIAGNOSIS — E119 Type 2 diabetes mellitus without complications: Secondary | ICD-10-CM | POA: Insufficient documentation

## 2018-06-27 DIAGNOSIS — R05 Cough: Secondary | ICD-10-CM | POA: Diagnosis not present

## 2018-06-27 DIAGNOSIS — J441 Chronic obstructive pulmonary disease with (acute) exacerbation: Secondary | ICD-10-CM | POA: Diagnosis not present

## 2018-06-27 DIAGNOSIS — F17210 Nicotine dependence, cigarettes, uncomplicated: Secondary | ICD-10-CM | POA: Insufficient documentation

## 2018-06-27 DIAGNOSIS — R0600 Dyspnea, unspecified: Secondary | ICD-10-CM | POA: Diagnosis not present

## 2018-06-27 LAB — CBC WITH DIFFERENTIAL/PLATELET
BASOS PCT: 1 %
Basophils Absolute: 0.1 10*3/uL (ref 0–0.1)
Eosinophils Absolute: 0.4 10*3/uL (ref 0–0.7)
Eosinophils Relative: 5 %
HEMATOCRIT: 46.5 % (ref 35.0–47.0)
Hemoglobin: 15.3 g/dL (ref 12.0–16.0)
LYMPHS ABS: 3.2 10*3/uL (ref 1.0–3.6)
Lymphocytes Relative: 36 %
MCH: 29.5 pg (ref 26.0–34.0)
MCHC: 32.9 g/dL (ref 32.0–36.0)
MCV: 89.5 fL (ref 80.0–100.0)
MONO ABS: 0.5 10*3/uL (ref 0.2–0.9)
MONOS PCT: 6 %
NEUTROS ABS: 4.6 10*3/uL (ref 1.4–6.5)
Neutrophils Relative %: 52 %
Platelets: 333 10*3/uL (ref 150–440)
RBC: 5.19 MIL/uL (ref 3.80–5.20)
RDW: 14.9 % — AB (ref 11.5–14.5)
WBC: 8.8 10*3/uL (ref 3.6–11.0)

## 2018-06-27 LAB — BASIC METABOLIC PANEL
Anion gap: 8 (ref 5–15)
BUN: 22 mg/dL — AB (ref 6–20)
CALCIUM: 9.5 mg/dL (ref 8.9–10.3)
CHLORIDE: 105 mmol/L (ref 98–111)
CO2: 26 mmol/L (ref 22–32)
CREATININE: 1.68 mg/dL — AB (ref 0.44–1.00)
GFR calc non Af Amer: 34 mL/min — ABNORMAL LOW (ref >60)
GFR, EST AFRICAN AMERICAN: 39 mL/min — AB (ref >60)
GLUCOSE: 131 mg/dL — AB (ref 70–99)
Potassium: 4.2 mmol/L (ref 3.5–5.1)
Sodium: 139 mmol/L (ref 135–145)

## 2018-06-27 MED ORDER — IPRATROPIUM-ALBUTEROL 0.5-2.5 (3) MG/3ML IN SOLN
RESPIRATORY_TRACT | Status: AC
  Administered 2018-06-27: 3 mL via RESPIRATORY_TRACT
  Filled 2018-06-27: qty 6

## 2018-06-27 MED ORDER — IPRATROPIUM-ALBUTEROL 0.5-2.5 (3) MG/3ML IN SOLN
3.0000 mL | Freq: Once | RESPIRATORY_TRACT | Status: AC
  Administered 2018-06-27: 3 mL via RESPIRATORY_TRACT

## 2018-06-27 MED ORDER — PREDNISONE 20 MG PO TABS: 40.0000 mg | ORAL_TABLET | Freq: Every day | ORAL | 0 refills | Status: AC

## 2018-06-27 MED ORDER — ALBUTEROL SULFATE HFA 108 (90 BASE) MCG/ACT IN AERS: 2.0000 | INHALATION_SPRAY | Freq: Four times a day (QID) | RESPIRATORY_TRACT | 2 refills | Status: DC | PRN

## 2018-06-27 NOTE — ED Provider Notes (Signed)
[facility] Emergency Department Provider Note    First MD Initiated Contact with Patient 06/27/18 1213     (approximate)  I have reviewed the triage vital signs and the nursing notes.   HISTORY  Chief Complaint Shortness of Breath and Wheezing    HPI Samantha [name] is a 52 y.o. female since the ER with chief of shortness of breath.  She does have a history of COPD and wears 2 L nasal cannula at night.  States over the past 3 days she has had worsening cough as well as shortness of breath even at rest.  States she is coughing up phlegm.  Denies any fevers or chills.  Denies any chest pain.  No lower extremity swelling.  Feels similar to previous episodes of COPD asthma exacerbation.  Patient was brought in by EMS and was given nebulizer treatment as well as steroids and is already feeling much improved.    Past Medical History:  Diagnosis Date  . Acute on chronic respiratory failure with hypoxemia (HCC) 08/30/2015  . Asthma   . COPD (chronic obstructive pulmonary disease) (HCC)   . Diabetes mellitus without complication (HCC)   . Hypertension    Family History  Problem Relation Age of Onset  . Asthma Mother   . Diabetes Mother   . Hypertension Mother   . Asthma Sister   . Heart attack Father    Past Surgical History:  Procedure Laterality Date  . none     Patient Active Problem List   Diagnosis Date Noted  . COPD exacerbation (HCC) 02/21/2018  . Acute on chronic respiratory failure (HCC) 10/06/2015  . Acute on chronic respiratory failure with hypoxemia (HCC) 08/30/2015      Prior to Admission medications   Medication Sig Start Date End Date Taking? Authorizing Provider  albuterol (PROVENTIL HFA;VENTOLIN HFA) 108 (90 Base) MCG/ACT inhaler Inhale 2 puffs into the lungs every 6 (six) hours as needed for wheezing or shortness of breath. 09/19/17   Merrily Brittle, MD  albuterol (PROVENTIL HFA;VENTOLIN HFA) 108 (90 Base) MCG/ACT inhaler Inhale  2 puffs into the lungs every 6 (six) hours as needed for wheezing or shortness of breath. 06/27/18   Willy Eddy, MD  budesonide-formoterol [facility]) 160-4.5 MCG/ACT inhaler Inhale 2 puffs 2 (two) times daily into the lungs. 10/30/17   Emily Filbert, MD  gabapentin (NEURONTIN) 300 MG capsule Take 300 mg by mouth 2 (two) times daily.    [provider]  glimepiride (AMARYL) 4 MG tablet Take 4 mg by mouth daily.    [provider]  guaiFENesin-dextromethorphan (ROBITUSSIN DM) 100-10 MG/5ML syrup Take 5 mLs by mouth every 4 (four) hours as needed for cough. 02/22/18   Houston Siren, MD  losartan-hydrochlorothiazide (HYZAAR) 50-12.5 MG tablet Take 1 tablet by mouth daily. 02/22/16   [provider]  metFORMIN (GLUCOPHAGE) 850 MG tablet Take 1 tablet by mouth 2 (two) times daily with a meal.  12/11/15   [provider]  montelukast (SINGULAIR) 10 MG tablet Take 10 mg by mouth at bedtime.     [provider]  predniSONE (DELTASONE) 10 MG tablet Label  & dispense according to the schedule below. 5 Pills PO for 1 day then, 4 Pills PO for 1 day, 3 Pills PO for 1 day, 2 Pills PO for 1 day, 1 Pill PO for 1 days then STOP. 02/22/18   Houston Siren, MD  predniSONE (DELTASONE) 20 MG tablet Take 2 tablets (40 mg  total) by mouth daily for 5 days. 06/27/18 07/02/18  Willy Eddyobinson, Kennede Lusk, MD    Allergies Percocet [oxycodone-acetaminophen]    Social History Social History   Tobacco Use  . Smoking status: Current Every Day Smoker    Packs/day: 0.10    Years: 23.00    Pack years: 2.30    Types: Cigarettes  . Smokeless tobacco: Never Used  Substance Use Topics  . Alcohol use: Yes  . Drug use: Yes    Types: Cocaine    Comment: 2 weeks ago snorted it.      Review of Systems Patient denies headaches, rhinorrhea, blurry vision, numbness, shortness of breath, chest pain, edema, cough, abdominal pain, nausea, vomiting, diarrhea, dysuria, fevers, rashes  or hallucinations unless otherwise stated above in HPI. ____________________________________________   PHYSICAL EXAM:  VITAL SIGNS: Vitals:   06/27/18 1215  BP: (!) 165/99  Pulse: 97  Resp: 20  Temp: 98.2 F (36.8 C)  SpO2: 95%    Constitutional: Alert and oriented.  Eyes: Conjunctivae are normal.  Head: Atraumatic. Nose: No congestion/rhinnorhea. Mouth/Throat: Mucous membranes are moist.   Neck: No stridor. Painless ROM.  Cardiovascular: Normal rate, regular rhythm. Grossly normal heart sounds.  Good peripheral circulation. Respiratory: Normal respiratory effort.  No retractions. Lungs with diffuse coarse expiratory wheeze Gastrointestinal: Soft and nontender. No distention. No abdominal bruits. No CVA tenderness. Genitourinary: deferred Musculoskeletal: No lower extremity tenderness nor edema.  No joint effusions. Neurologic:  Normal speech and language. No gross focal neurologic deficits are appreciated. No facial droop Skin:  Skin is warm, dry and intact. No rash noted. Psychiatric: Mood and affect are normal. Speech and behavior are normal.  ____________________________________________   LABS (all labs ordered are listed, but only abnormal results are displayed)  Results for orders placed or performed during the hospital encounter of 06/27/18 (from the past 24 hour(s))  CBC with Differential/Platelet     Status: Abnormal   Collection Time: 06/27/18 12:15 PM  Result Value Ref Range   WBC 8.8 3.6 - 11.0 K/uL   RBC 5.19 3.80 - 5.20 MIL/uL   Hemoglobin 15.3 12.0 - 16.0 g/dL   HCT 16.146.5 09.635.0 - 04.547.0 %   MCV 89.5 80.0 - 100.0 fL   MCH 29.5 26.0 - 34.0 pg   MCHC 32.9 32.0 - 36.0 g/dL   RDW 40.914.9 (H) 81.111.5 - 91.414.5 %   Platelets 333 150 - 440 K/uL   Neutrophils Relative % 52 %   Neutro Abs 4.6 1.4 - 6.5 K/uL   Lymphocytes Relative 36 %   Lymphs Abs 3.2 1.0 - 3.6 K/uL   Monocytes Relative 6 %   Monocytes Absolute 0.5 0.2 - 0.9 K/uL   Eosinophils Relative 5 %    Eosinophils Absolute 0.4 0 - 0.7 K/uL   Basophils Relative 1 %   Basophils Absolute 0.1 0 - 0.1 K/uL  Basic metabolic panel     Status: Abnormal   Collection Time: 06/27/18 12:15 PM  Result Value Ref Range   Sodium 139 135 - 145 mmol/L   Potassium 4.2 3.5 - 5.1 mmol/L   Chloride 105 98 - 111 mmol/L   CO2 26 22 - 32 mmol/L   Glucose, Bld 131 (H) 70 - 99 mg/dL   BUN 22 (H) 6 - 20 mg/dL   Creatinine, Ser 7.821.68 (H) 0.44 - 1.00 mg/dL   Calcium 9.5 8.9 - 95.610.3 mg/dL   GFR calc non Af Amer 34 (L) >60 mL/min   GFR calc Af Amer 39 (L) >  60 mL/min   Anion gap 8 5 - 15   ____________________________________________  EKG My review and personal interpretation at Time: 12:12   Indication: sob  Rate: 95  Rhythm: sinus Axis: normal Other: normal intervals, no stemi ____________________________________________  RADIOLOGY  I personally reviewed all radiographic images ordered to evaluate for the above acute complaints and reviewed radiology reports and findings.  These findings were personally discussed with the patient.  Please see medical record for radiology report.  ____________________________________________   PROCEDURES  Procedure(s) performed:  Procedures    Critical Care performed: no ____________________________________________   INITIAL IMPRESSION / ASSESSMENT AND PLAN / ED COURSE  Pertinent labs & imaging results that were available during my care of the patient were reviewed by me and considered in my medical decision making (see chart for details).   DDX: Asthma, copd, CHF, pna, ptx, malignancy, Pe, anemia   Samantha [name] is a 52 y.o. who presents to the ED with shortness of breath as described above.  Presentation most clinically consistent with COPD exacerbation.  Will give additional nebulizer treatment.  She is artery received steroids.  EKG shows no evidence of acute ischemia.  Will check chest x-ray to ensure there is no consolidation given her productive cough.   The patient will be placed on continuous pulse oximetry and telemetry for monitoring.  Laboratory evaluation will be sent to evaluate for the above complaints.     Clinical Course as of Jun 27 1317  Wed Jun 27, 2018  1313 Patient feels significantly improved.  Chest x-ray shows no evidence of consolidation or edema.  Denies any chest pain.  Presentation most consistent with COPD exacerbation.  Patient stable and appropriate for outpatient follow-up.   [PR]    Clinical Course User Index [PR] Willy Eddy, MD     As part of my medical decision making, I reviewed the following data within the electronic MEDICAL RECORD NUMBER Nursing notes reviewed and incorporated, Labs reviewed, notes from prior ED visits and [place] Controlled Substance Database   ____________________________________________   FINAL CLINICAL IMPRESSION(S) / ED DIAGNOSES  Final diagnoses:  COPD exacerbation (HCC)      NEW MEDICATIONS STARTED DURING THIS VISIT:  New Prescriptions   ALBUTEROL (PROVENTIL HFA;VENTOLIN HFA) 108 (90 BASE) MCG/ACT INHALER    Inhale 2 puffs into the lungs every 6 (six) hours as needed for wheezing or shortness of breath.   PREDNISONE (DELTASONE) 20 MG TABLET    Take 2 tablets (40 mg total) by mouth daily for 5 days.     Note:  This document was prepared using Dragon voice recognition software and may include unintentional dictation errors.    Willy Eddy, MD 06/27/18 1318

## 2018-06-27 NOTE — ED Triage Notes (Addendum)
[facility]. Hx COPD and asthma, using inhalers at home without relief. 90% on RA for EMS at home. 95% RA here in ED. Received solumedrol and 1 duoneb by EMS. +wheezing, productive cough. Denies CP or N/V/D. Uses 2lpm Hissop at night and still occasionally smokes cigarettes.

## 2018-06-27 NOTE — ED Notes (Signed)
Patient transported to X-ray 

## 2018-06-28 ENCOUNTER — Ambulatory Visit (INDEPENDENT_AMBULATORY_CARE_PROVIDER_SITE_OTHER): Payer: Medicare Other | Admitting: Surgery

## 2018-06-28 ENCOUNTER — Encounter: Payer: Self-pay | Admitting: Surgery

## 2018-06-28 VITALS — BP 169/112 | HR 112 | Temp 98.2°F | Ht 67.0 in | Wt 263.4 lb

## 2018-06-28 DIAGNOSIS — K429 Umbilical hernia without obstruction or gangrene: Secondary | ICD-10-CM | POA: Diagnosis not present

## 2018-06-28 NOTE — Progress Notes (Signed)
Surgical Clinic History and Physical  Referring provider:  [facility] [address] [place], [place] [address]  HISTORY OF PRESENT ILLNESS (HPI):  52 y.o. obese female on chronic oral systemic steroids >10 years and home supplemental oxygen for severe COPD with multiple recent ED presentations for COPD exacerbation (including yesterday) presents for evaluation of her umbilical hernia. Patient reports it's been present "for years" and causes her a "mild aching" sensation, particularly when constipated, but she denies any pain. She further adds that she has struggled with constipation her "whole life", but does not take anything for it, and she has continued to gain weight ever since starting oral systemic steroids >10 years ago. She describes that her hernia "goes back in" when she lies down and pushes out when lifting something heavy around her house or when straining during BM's. She otherwise denies the hernia every staying out and has also noticed a diffuse midline bulge from her umbilicus to her breast bone for "many years", though she says she's unsure whether it was present before her laparoscopic cholecystectomy "many years" ago. She was pregnant once with twins, delivered via lower midline caesarian section. Patient denies N/V, fever/chills, or CP. Despite home oxygen, patient continues to smoke.  PAST MEDICAL HISTORY (PMH):  Past Medical History:  Diagnosis Date  . Acute on chronic respiratory failure with hypoxemia (HCC) 08/30/2015  . Asthma   . COPD (chronic obstructive pulmonary disease) (HCC)   . Diabetes mellitus without complication (HCC)   . Hypertension      PAST SURGICAL HISTORY (PSH):  Past Surgical History:  Procedure Laterality Date  . none       MEDICATIONS:  Prior to Admission medications   Medication Sig Start Date End Date Taking? Authorizing Provider  albuterol (PROVENTIL HFA;VENTOLIN HFA) 108 (90 Base) MCG/ACT inhaler  Inhale 2 puffs into the lungs every 6 (six) hours as needed for wheezing or shortness of breath. 09/19/17  Yes Merrily Brittle, MD  albuterol (PROVENTIL HFA;VENTOLIN HFA) 108 (90 Base) MCG/ACT inhaler Inhale 2 puffs into the lungs every 6 (six) hours as needed for wheezing or shortness of breath. 06/27/18  Yes Willy Eddy, MD  budesonide-formoterol [facility]) 160-4.5 MCG/ACT inhaler Inhale 2 puffs 2 (two) times daily into the lungs. 10/30/17  Yes Emily Filbert, MD  gabapentin (NEURONTIN) 300 MG capsule Take 300 mg by mouth 2 (two) times daily.   Yes [provider]  glimepiride (AMARYL) 4 MG tablet Take 4 mg by mouth daily.   Yes [provider]  guaiFENesin-dextromethorphan (ROBITUSSIN DM) 100-10 MG/5ML syrup Take 5 mLs by mouth every 4 (four) hours as needed for cough. 02/22/18  Yes Sainani, Rolly Pancake, MD  losartan-hydrochlorothiazide (HYZAAR) 50-12.5 MG tablet Take 1 tablet by mouth daily. 02/22/16  Yes [provider]  metFORMIN (GLUCOPHAGE) 850 MG tablet Take 1 tablet by mouth 2 (two) times daily with a meal.  12/11/15  Yes [provider]  montelukast (SINGULAIR) 10 MG tablet Take 10 mg by mouth at bedtime.    Yes [provider]  predniSONE (DELTASONE) 10 MG tablet Label  & dispense according to the schedule below. 5 Pills PO for 1 day then, 4 Pills PO for 1 day, 3 Pills PO for 1 day, 2 Pills PO for 1 day, 1 Pill PO for 1 days then STOP. 02/22/18  Yes Sainani, Rolly Pancake, MD  predniSONE (DELTASONE) 20 MG tablet Take 2 tablets (40 mg total) by mouth daily for 5 days. 06/27/18  07/02/18 Yes Willy Eddy, MD     ALLERGIES:  Allergies  Allergen Reactions  . Percocet [Oxycodone-Acetaminophen] Itching     SOCIAL HISTORY:  Social History   Socioeconomic History  . Marital status: Divorced    Spouse name: Not on file  . Number of children: Not on file  . Years of education: Not on file  . Highest education level: Not on file  Occupational  History  . Not on file  Social Needs  . Financial resource strain: Not on file  . Food insecurity:    Worry: Not on file    Inability: Not on file  . Transportation needs:    Medical: Not on file    Non-medical: Not on file  Tobacco Use  . Smoking status: Current Every Day Smoker    Packs/day: 0.10    Years: 23.00    Pack years: 2.30    Types: Cigarettes  . Smokeless tobacco: Never Used  Substance and Sexual Activity  . Alcohol use: Yes  . Drug use: Yes    Types: Cocaine    Comment: 2 weeks ago snorted it.    . Sexual activity: Not on file  Lifestyle  . Physical activity:    Days per week: Not on file    Minutes per session: Not on file  . Stress: Not on file  Relationships  . Social connections:    Talks on phone: Not on file    Gets together: Not on file    Attends religious service: Not on file    Active member of club or organization: Not on file    Attends meetings of clubs or organizations: Not on file    Relationship status: Not on file  . Intimate partner violence:    Fear of current or ex partner: Not on file    Emotionally abused: Not on file    Physically abused: Not on file    Forced sexual activity: Not on file  Other Topics Concern  . Not on file  Social History Narrative  . Not on file    The patient currently resides (home / rehab facility / nursing home): Home The patient normally is (ambulatory / bedbound): Ambulatory  FAMILY HISTORY:  Family History  Problem Relation Age of Onset  . Asthma Mother   . Diabetes Mother   . Hypertension Mother   . Asthma Sister   . Heart attack Father     Otherwise negative/non-contributory.  REVIEW OF SYSTEMS:  Constitutional: denies any other weight loss, fever, chills, or sweats  Eyes: denies any other vision changes, history of eye injury  ENT: denies sore throat, hearing problems  Respiratory: shortness of breath as per HPI, denies wheezing  Cardiovascular: denies chest pain, palpitations   Gastrointestinal: abdominal pain, N/V, and bowel function as per HPI Musculoskeletal: denies any other joint pains or cramps  Skin: Denies any other rashes or skin discolorations Neurological: denies any other headache, dizziness, weakness  Psychiatric: Denies any other depression, anxiety   All other review of systems were otherwise negative   VITAL SIGNS:  BP (!) 169/112   Pulse (!) 112   Temp 98.2 F (36.8 C) (Oral)   Ht 5\' 7"  (1.702 m)   Wt 263 lb 6.4 oz (119.5 kg)   BMI 41.25 kg/m   PHYSICAL EXAM:  Constitutional:  -- Obese body habitus  -- Awake, alert, and oriented x3  Eyes:  -- Pupils equally round and reactive to light  -- No scleral icterus  Ear, nose, throat:  -- No jugular venous distension -- No nasal drainage, bleeding Pulmonary:  -- No crackles  -- Equal breath sounds bilaterally -- Breathing non-labored at rest Cardiovascular:  -- S1, S2 present  -- No pericardial rubs  Gastrointestinal:  -- Abdomen soft, minimally - nontender, non-distended, no guarding/rebound  -- Hernias likely obscured by body habitus, but supra-umbilical hernia appears to be present, superimposed on rectus diastasis, while epigastric port site hernia from remote cholecystectomy is possible, though unclear -- No other abdominal masses appreciated, pulsatile or otherwise  Musculoskeletal and Integumentary:  -- Wounds or skin discoloration: None appreciated -- Extremities: B/L UE and LE FROM, hands and feet warm, no edema  Neurologic:  -- Motor function: Intact and symmetric -- Sensation: Intact and symmetric  Labs:  CBC Latest Ref Rng & Units 06/27/2018 02/22/2018 02/21/2018  WBC 3.6 - 11.0 K/uL 8.8 9.8 8.9  Hemoglobin 12.0 - 16.0 g/dL 16.115.3 09.614.6 04.514.8  Hematocrit 35.0 - 47.0 % 46.5 44.6 45.2  Platelets 150 - 440 K/uL 333 311 308   CMP Latest Ref Rng & Units 06/27/2018 02/22/2018 02/21/2018  Glucose 70 - 99 mg/dL 409(W131(H) 119(J306(H) 478(G213(H)  BUN 6 - 20 mg/dL 95(A22(H) 21(H30(H) 08(M21(H)  Creatinine  0.44 - 1.00 mg/dL 5.78(I1.68(H) 6.96(E1.42(H) 9.52(W1.41(H)  Sodium 135 - 145 mmol/L 139 136 140  Potassium 3.5 - 5.1 mmol/L 4.2 4.1 3.6  Chloride 98 - 111 mmol/L 105 103 99(L)  CO2 22 - 32 mmol/L 26 24 31   Calcium 8.9 - 10.3 mg/dL 9.5 9.2 4.1(L8.6(L)  Total Protein 6.5 - 8.1 g/dL - - -  Total Bilirubin 0.3 - 1.2 mg/dL - - -  Alkaline Phos 38 - 126 U/L - - -  AST 15 - 41 U/L - - -  ALT 14 - 54 U/L - - -   Imaging studies: No new pertinent imaging studies available for review   Assessment/Plan:  52 y.o. female with a mildly symptomatic supraumbilical hernia and asymptomatic diastasis recti (though cannot completely exclude only diastasis recti without supraumbilical hernia or epigastric post-surgical port site hernia), complicated by co-morbidities including morbid obesity (BMI >41), DM, HTN, severe COPD on home supplemental oxygen with multiple recent ED presentations (including yesterday) for COPD exacerbations, chronic oral systemic steroids, chronic constipation, and chronic ongoing tobacco abuse (smoking).   - patient high risk for surgery, limited symptoms  - abdominal binder abdominal binder for symptomatic relief  - discussed signs/symptoms of incarceration and obstruction  - importance of adequate hydration and high fiber diet discussed  - once daily colace (can increase to BID prn) + miralax/mag citrate prn  - weight loss and smoking cessation strongly encouraged  - instructed to call if any questions or concerns  - return to clinic as needed  All of the above recommendations were discussed with the patient, and all of patient's questions were answered to her expressed satisfaction.  Thank you for the opportunity to participate in this patient's care.  -- Scherrie GerlachJason E. Earlene Plateravis, MD, RPVI [place]: [facility] - Partnering for exceptional care. Office: [phone]

## 2018-06-28 NOTE — Patient Instructions (Signed)
Take colace daily. Increase water to 72 ounces daily. Increase fiber in your daily diet to reduce constipation.  Please try to quit smoking, and try to lose weight.  Wear an abdominal binder all day and remove when going to bed.    Diastasis Recti Diastasis recti is when the muscles of the abdomen (rectus abdominis muscles) become thin and separate. The result is a wider space between the right and left abdomen (abdominal) muscles. This wider space between the muscles may cause a bulge in the middle of your abdomen. You may notice this bulge when you are straining or when you sit up from a lying down position. Diastasis recti can affect men and women. It is most common among pregnant women, infants, people who are obese, and people who have had abdominal surgery. Exercise or surgical treatment may help correct it. What are the causes? Common causes of this condition include:  Pregnancy. The growing uterus puts pressure on the abdominal muscles, which causes the muscles to separate.  Obesity. Excess fat puts pressure on abdominal muscles.  Weightlifting.  Some abdomen exercises.  Advanced age.  Genetics.  Prior abdominal surgery.  What increases the risk? This condition is more likely to develop in:  Women.  Newborns, especially newborns who are born early (prematurely).  What are the signs or symptoms? Common symptoms of this condition include:  A bulge in the middle of the abdomen. You will notice it most when you sit up or strain.  Pain in the low back, pelvis, or hips.  Constipation.  Inability to control when you urinate (urinary incontinence).  Bloating.  Poor posture.  How is this diagnosed? This condition is diagnosed with a physical exam. Your health care provider will ask you to lie flat on your back and do a crunch or half sit-up. If you have diastasis recti, a vertical bulge will appear between your abdominal muscles in the center of your abdomen. Your  health care provider will measure the gap between your muscles with one of the following:  A medical device used to measure the space between two objects (caliper).  A tape measure.  CT scan.  Ultrasound.  Finger spaces. Your health care provider will measure the space using their fingers.  How is this treated? If your muscle separation is not too large, you may not need treatment. However, if you are a woman who plans to become pregnant again, you should treat this condition before your next pregnancy. Treatment may include:  Physical therapy to strengthen and tighten your abdominal muscles.  Lifestyle changes such as weight loss and exercise.  Over-the-counter pain medicines as needed.  Surgery to correct the separation.  Follow these instructions at home: Activity  Return to your normal activities as told by your health care provider. Ask your health care provider what activities are safe for you.  When lifting weights or doing exercises using your abdominal muscles or the muscles in the center of your body that give stability (core muscles), make sure you are doing your exercises and movements correctly. Proper form can help to prevent the condition from happening again. General instructions  If you are overweight, ask your health care provider for help with weight loss. Losing even a small amount of weight can help to improve your diastasis recti.  Take over-the-counter or prescription medicines only as told by your health care provider.  Do not strain. Straining can make the separation worse. Examples of straining include: ? Pushing hard to have a  bowel movement, such as due to constipation. ? Lifting heavy objects, including children. ? Standing up and sitting down.  Take steps to prevent constipation: ? Drink enough fluid to keep your urine clear or pale yellow. ? Take over-the-counter or prescription medicines only as directed. ? Eat foods that are high in fiber,  such as fresh fruits and vegetables, whole grains, and beans. ? Limit foods that are high in fat and processed sugars, such as fried and sweet foods. Contact a health care provider if:  You notice a new bulge in your abdomen. Get help right away if:  You experience severe discomfort in your abdomen.  You develop severe abdominal pain along with nausea, vomiting, or fever. Summary  Diastasis recti is when the abdomen (abdominal) muscles become thin and separate. Your abdomen will stick out because the space between your right and left abdomen muscles has widened.  The most common symptom is a bulge in your abdomen. You will notice it most when you sit up or are straining.  This condition is diagnosed during a physical exam.  If the abdomen separation is not too big, you may choose not to have treatment. Otherwise, you may need to undergo physical therapy or surgery. This information is not intended to replace advice given to you by your health care provider. Make sure you discuss any questions you have with your health care provider. Document Released: 01/23/2017 Document Revised: 01/23/2017 Document Reviewed: 01/23/2017 Elsevier Interactive Patient Education  2018 [employer].  High-Fiber Diet Fiber, also called dietary fiber, is a type of carbohydrate found in fruits, vegetables, whole grains, and beans. A high-fiber diet can have many health benefits. Your health care provider may recommend a high-fiber diet to help:  Prevent constipation. Fiber can make your bowel movements more regular.  Lower your cholesterol.  Relieve hemorrhoids, uncomplicated diverticulosis, or irritable bowel syndrome.  Prevent overeating as part of a weight-loss plan.  Prevent heart disease, type 2 diabetes, and certain cancers.  What is my plan? The recommended daily intake of fiber includes:  38 grams for men under age 70.  30 grams for men over age 14.  25 grams for women under age 7.  21  grams for women over age 38.  You can get the recommended daily intake of dietary fiber by eating a variety of fruits, vegetables, grains, and beans. Your health care provider may also recommend a fiber supplement if it is not possible to get enough fiber through your diet. What do I need to know about a high-fiber diet?  Fiber supplements have not been widely studied for their effectiveness, so it is better to get fiber through food sources.  Always check the fiber content on thenutrition facts label of any prepackaged food. Look for foods that contain at least 5 grams of fiber per serving.  Ask your dietitian if you have questions about specific foods that are related to your condition, especially if those foods are not listed in the following section.  Increase your daily fiber consumption gradually. Increasing your intake of dietary fiber too quickly may cause bloating, cramping, or gas.  Drink plenty of water. Water helps you to digest fiber. What foods can I eat? Grains Whole-grain breads. Multigrain cereal. Oats and oatmeal. Brown rice. Barley. Bulgur wheat. Millet. Bran muffins. Popcorn. Rye wafer crackers. Vegetables Sweet potatoes. Spinach. Kale. Artichokes. Cabbage. Broccoli. Green peas. Carrots. Squash. Fruits Berries. Pears. Apples. Oranges. Avocados. Prunes and raisins. Dried figs. Meats and Other Protein Sources [employer],  kidney, pinto, and soy beans. Split peas. Lentils. Nuts and seeds. Dairy Fiber-fortified yogurt. Beverages Fiber-fortified soy milk. Fiber-fortified orange juice. Other Fiber bars. The items listed above may not be a complete list of recommended foods or beverages. Contact your dietitian for more options. What foods are not recommended? Grains White bread. Pasta made with refined flour. White rice. Vegetables Fried potatoes. Canned vegetables. Well-cooked vegetables. Fruits Fruit juice. Cooked, strained fruit. Meats and Other Protein Sources Fatty  cuts of meat. Fried Environmental education officer or fried fish. Dairy Milk. Yogurt. Cream cheese. Sour cream. Beverages Soft drinks. Other Cakes and pastries. Butter and oils. The items listed above may not be a complete list of foods and beverages to avoid. Contact your dietitian for more information. What are some tips for including high-fiber foods in my diet?  Eat a wide variety of high-fiber foods.  Make sure that half of all grains consumed each day are whole grains.  Replace breads and cereals made from refined flour or white flour with whole-grain breads and cereals.  Replace white rice with brown rice, bulgur wheat, or millet.  Start the day with a breakfast that is high in fiber, such as a cereal that contains at least 5 grams of fiber per serving.  Use beans in place of meat in soups, salads, or pasta.  Eat high-fiber snacks, such as berries, raw vegetables, nuts, or popcorn. This information is not intended to replace advice given to you by your health care provider. Make sure you discuss any questions you have with your health care provider. Document Released: 11/28/2005 Document Revised: 05/05/2016 Document Reviewed: 05/13/2014 Elsevier Interactive Patient Education  [employer].

## 2018-06-29 IMAGING — CR DG CHEST 2V
2 series · 2 of 2 positions shown · non-contrast
Comparison: 02/19/2017

CLINICAL DATA: Short of breath

EXAM:
CHEST  2 VIEW

[chest pa]
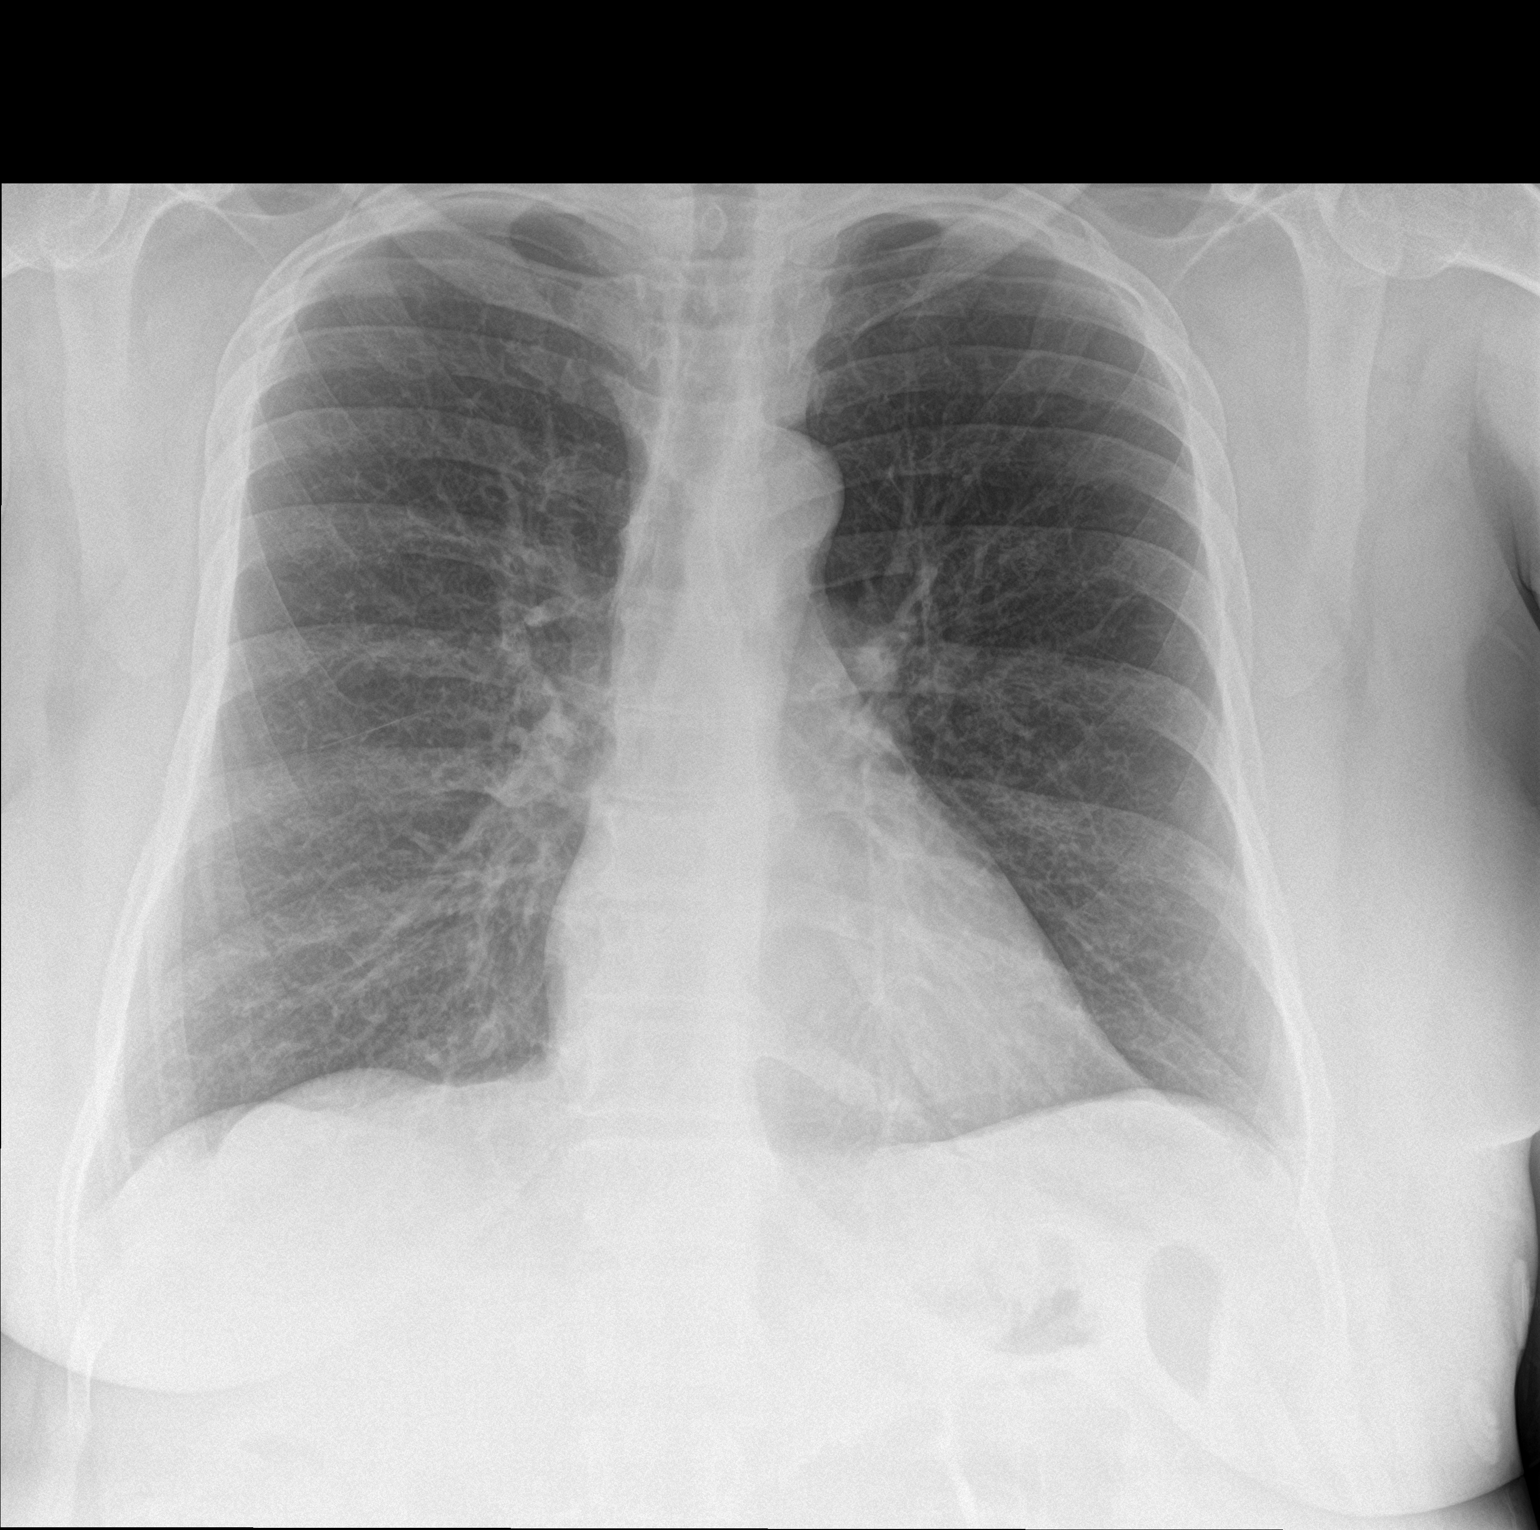

[chest lat]
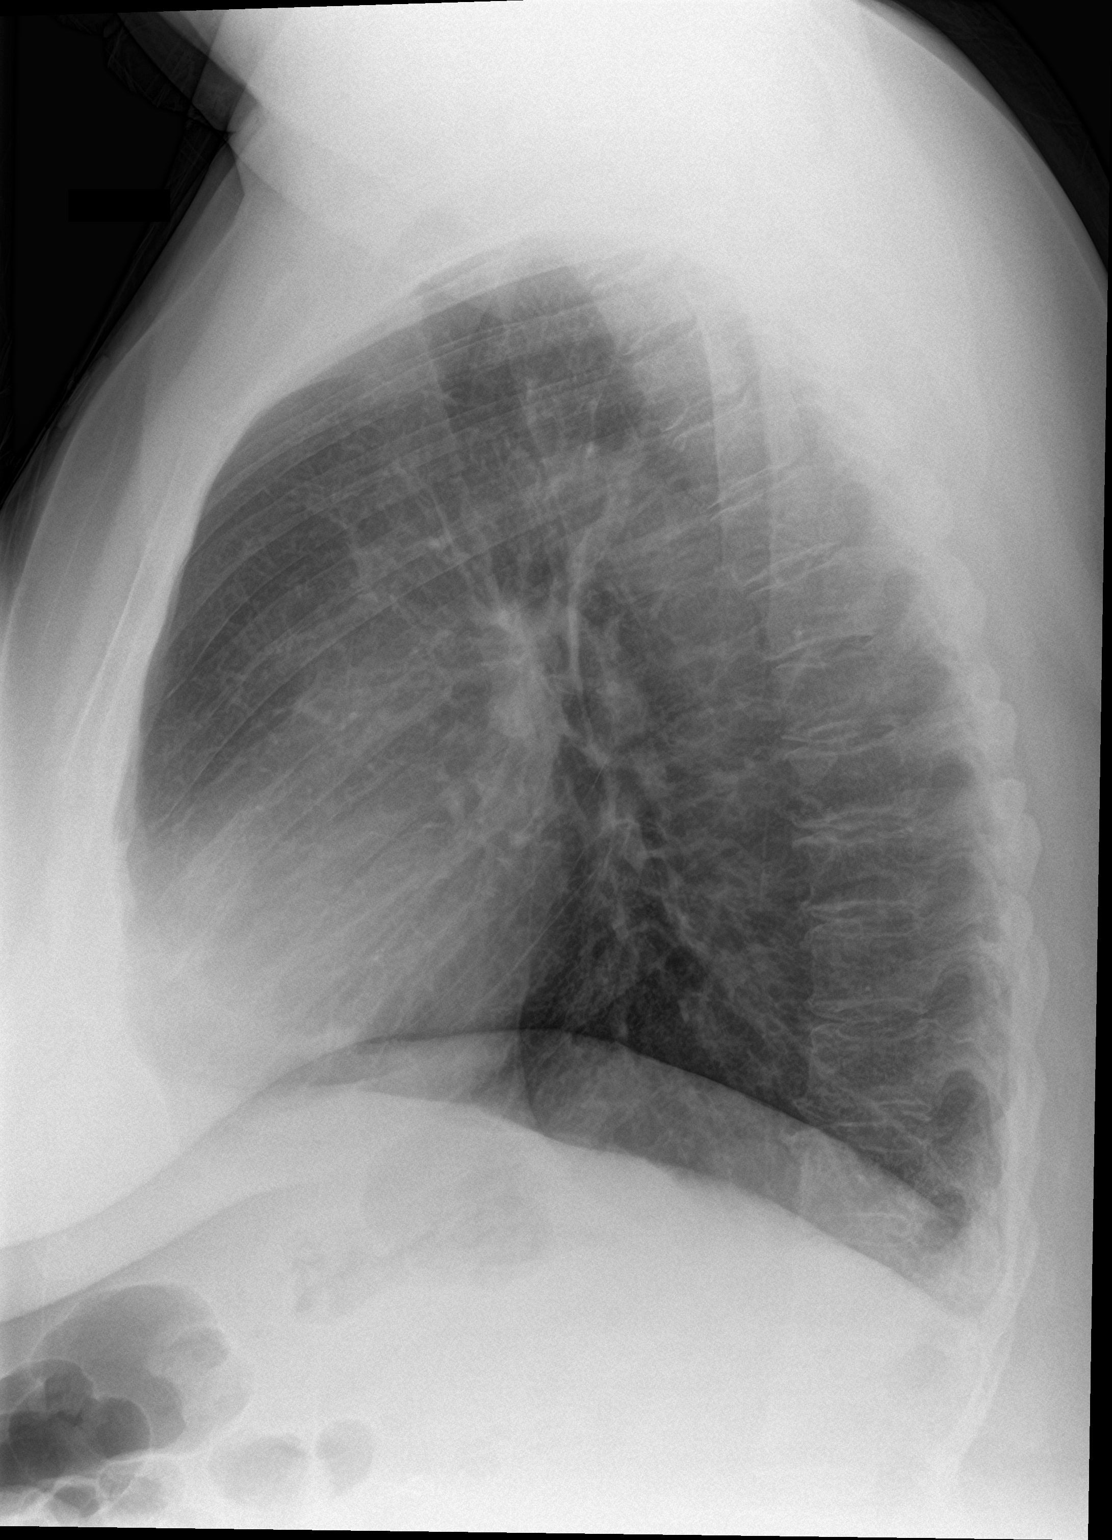

[2 of 2 positions shown; findings below may reference images not displayed]

FINDINGS: The heart size and mediastinal contours are within normal limits.
Both lungs are clear. The visualized skeletal structures are
unremarkable.
IMPRESSION: No active cardiopulmonary disease.

## 2018-07-08 ENCOUNTER — Emergency Department
Admission: EM | Admit: 2018-07-08 | Discharge: 2018-07-08 | Disposition: A | Discharge status: Home / Self Care | Payer: Medicare Other | Attending: Emergency Medicine | Admitting: Emergency Medicine

## 2018-07-08 ENCOUNTER — Other Ambulatory Visit: Payer: Self-pay

## 2018-07-08 ENCOUNTER — Encounter: Payer: Self-pay | Admitting: Emergency Medicine

## 2018-07-08 ENCOUNTER — Emergency Department: Payer: Medicare Other

## 2018-07-08 DIAGNOSIS — Z79899 Other long term (current) drug therapy: Secondary | ICD-10-CM | POA: Insufficient documentation

## 2018-07-08 DIAGNOSIS — E119 Type 2 diabetes mellitus without complications: Secondary | ICD-10-CM | POA: Insufficient documentation

## 2018-07-08 DIAGNOSIS — Z7984 Long term (current) use of oral hypoglycemic drugs: Secondary | ICD-10-CM | POA: Insufficient documentation

## 2018-07-08 DIAGNOSIS — F17210 Nicotine dependence, cigarettes, uncomplicated: Secondary | ICD-10-CM | POA: Insufficient documentation

## 2018-07-08 DIAGNOSIS — J441 Chronic obstructive pulmonary disease with (acute) exacerbation: Secondary | ICD-10-CM

## 2018-07-08 DIAGNOSIS — I10 Essential (primary) hypertension: Secondary | ICD-10-CM | POA: Insufficient documentation

## 2018-07-08 DIAGNOSIS — R0602 Shortness of breath: Secondary | ICD-10-CM | POA: Diagnosis not present

## 2018-07-08 LAB — BASIC METABOLIC PANEL
Anion gap: 8 (ref 5–15)
BUN: 24 mg/dL — ABNORMAL HIGH (ref 6–20)
CHLORIDE: 108 mmol/L (ref 98–111)
CO2: 24 mmol/L (ref 22–32)
Calcium: 9.1 mg/dL (ref 8.9–10.3)
Creatinine, Ser: 1.47 mg/dL — ABNORMAL HIGH (ref 0.44–1.00)
GFR calc non Af Amer: 40 mL/min — ABNORMAL LOW (ref >60)
GFR, EST AFRICAN AMERICAN: 46 mL/min — AB (ref >60)
Glucose, Bld: 149 mg/dL — ABNORMAL HIGH (ref 70–99)
Potassium: 4.2 mmol/L (ref 3.5–5.1)
SODIUM: 140 mmol/L (ref 135–145)

## 2018-07-08 LAB — CBC WITH DIFFERENTIAL/PLATELET
Basophils Absolute: 0.1 10*3/uL (ref 0–0.1)
Basophils Relative: 1 %
Eosinophils Absolute: 0.5 10*3/uL (ref 0–0.7)
Eosinophils Relative: 5 %
HCT: 43 % (ref 35.0–47.0)
Hemoglobin: 15.3 g/dL (ref 12.0–16.0)
Lymphocytes Relative: 33 %
Lymphs Abs: 3.2 10*3/uL (ref 1.0–3.6)
MCH: 30.8 pg (ref 26.0–34.0)
MCHC: 35.5 g/dL (ref 32.0–36.0)
MCV: 86.7 fL (ref 80.0–100.0)
Monocytes Absolute: 0.6 10*3/uL (ref 0.2–0.9)
Monocytes Relative: 6 %
Neutro Abs: 5.5 10*3/uL (ref 1.4–6.5)
Neutrophils Relative %: 55 %
Platelets: 333 10*3/uL (ref 150–440)
RBC: 4.96 MIL/uL (ref 3.80–5.20)
RDW: 15.2 % — ABNORMAL HIGH (ref 11.5–14.5)
WBC: 9.9 10*3/uL (ref 3.6–11.0)

## 2018-07-08 LAB — TROPONIN I: Troponin I: <0.03 ng/mL (ref <0.03)

## 2018-07-08 MED ORDER — IPRATROPIUM-ALBUTEROL 0.5-2.5 (3) MG/3ML IN SOLN
3.0000 mL | Freq: Once | RESPIRATORY_TRACT | Status: AC
  Administered 2018-07-08: 3 mL via RESPIRATORY_TRACT

## 2018-07-08 MED ORDER — PREDNISONE 20 MG PO TABS: 60.0000 mg | ORAL_TABLET | Freq: Every day | ORAL | 0 refills | Status: AC

## 2018-07-08 MED ORDER — IPRATROPIUM-ALBUTEROL 0.5-2.5 (3) MG/3ML IN SOLN
RESPIRATORY_TRACT | Status: AC
  Administered 2018-07-08: 3 mL via RESPIRATORY_TRACT
  Filled 2018-07-08: qty 9

## 2018-07-08 MED ORDER — MAGNESIUM SULFATE 2 GM/50ML IV SOLN
2.0000 g | Freq: Once | INTRAVENOUS | Status: AC
  Administered 2018-07-08: 2 g via INTRAVENOUS

## 2018-07-08 MED ORDER — MAGNESIUM SULFATE 2 GM/50ML IV SOLN
INTRAVENOUS | Status: AC
  Administered 2018-07-08: 2 g via INTRAVENOUS
  Filled 2018-07-08: qty 50

## 2018-07-08 NOTE — Discharge Instructions (Addendum)

## 2018-07-08 NOTE — ED Provider Notes (Signed)
[facility] Emergency Department Provider Note  ____________________________________________  Time seen: Approximately 11:30 AM  I have reviewed the triage vital signs and the nursing notes.   HISTORY  Chief Complaint Shortness of Breath   HPI Samantha [name] is a 52 y.o. female with a history of COPD, asthma, diabetes, hypertension, smoking who presents for evaluation of shortness of breath.  Patient reports 1 week of cough productive of white sputum and progressively worsening shortness of breath.  She is on 2 L [place] at night time but has needed to use it during the day for the last day. No chest pain, no fever or chills, no nausea or vomiting.  She reports that she has been using her inhalers up to 6 times a day with minimal relief.  The shortness of breath became more severe this morning which prompted her visit to the emergency room.  Patient has a history of cocaine abuse but reports that she has not used it in several months.  She still smokes.  She denies leg pain or swelling, hemoptysis, recent travel immobilization, exogenous hormones, personal or family history of blood clots.   Past Medical History:  Diagnosis Date  . Acute on chronic respiratory failure with hypoxemia (HCC) 08/30/2015  . Asthma   . COPD (chronic obstructive pulmonary disease) (HCC)   . Diabetes mellitus without complication (HCC)   . Hypertension     Patient Active Problem List   Diagnosis Date Noted  . COPD exacerbation (HCC) 02/21/2018  . Acute on chronic respiratory failure (HCC) 10/06/2015  . Acute on chronic respiratory failure with hypoxemia (HCC) 08/30/2015    Past Surgical History:  Procedure Laterality Date  . none      Prior to Admission medications   Medication Sig Start Date End Date Taking? Authorizing Provider  albuterol (PROVENTIL HFA;VENTOLIN HFA) 108 (90 Base) MCG/ACT inhaler Inhale 2 puffs into the lungs every 6 (six) hours as needed for wheezing or  shortness of breath. 09/19/17   Merrily Brittle, MD  albuterol (PROVENTIL HFA;VENTOLIN HFA) 108 (90 Base) MCG/ACT inhaler Inhale 2 puffs into the lungs every 6 (six) hours as needed for wheezing or shortness of breath. 06/27/18   Willy Eddy, MD  budesonide-formoterol [facility]) 160-4.5 MCG/ACT inhaler Inhale 2 puffs 2 (two) times daily into the lungs. 10/30/17   Emily Filbert, MD  gabapentin (NEURONTIN) 300 MG capsule Take 300 mg by mouth 2 (two) times daily.    [provider]  glimepiride (AMARYL) 4 MG tablet Take 4 mg by mouth daily.    [provider]  guaiFENesin-dextromethorphan (ROBITUSSIN DM) 100-10 MG/5ML syrup Take 5 mLs by mouth every 4 (four) hours as needed for cough. 02/22/18   Houston Siren, MD  losartan-hydrochlorothiazide (HYZAAR) 50-12.5 MG tablet Take 1 tablet by mouth daily. 02/22/16   [provider]  metFORMIN (GLUCOPHAGE) 850 MG tablet Take 1 tablet by mouth 2 (two) times daily with a meal.  12/11/15   [provider]  montelukast (SINGULAIR) 10 MG tablet Take 10 mg by mouth at bedtime.     [provider]  predniSONE (DELTASONE) 10 MG tablet Label  & dispense according to the schedule below. 5 Pills PO for 1 day then, 4 Pills PO for 1 day, 3 Pills PO for 1 day, 2 Pills PO for 1 day, 1 Pill PO for 1 days then STOP. 02/22/18   Houston Siren, MD  predniSONE (DELTASONE) 20 MG tablet Take 3 tablets (60 mg total)  by mouth daily for 4 days. 07/08/18 07/12/18  Nita Sickle, MD    Allergies Percocet [oxycodone-acetaminophen]  Family History  Problem Relation Age of Onset  . Asthma Mother   . Diabetes Mother   . Hypertension Mother   . Asthma Sister   . Heart attack Father     Social History Social History   Tobacco Use  . Smoking status: Current Every Day Smoker    Packs/day: 0.10    Years: 23.00    Pack years: 2.30    Types: Cigarettes  . Smokeless tobacco: Never Used  Substance Use Topics  . Alcohol  use: Yes  . Drug use: Yes    Types: Cocaine    Comment: 2 weeks ago snorted it.      Review of Systems  Constitutional: Negative for fever. Eyes: Negative for visual changes. ENT: Negative for sore throat. Neck: No neck pain  Cardiovascular: Negative for chest pain. Respiratory: + shortness of breath, cough Gastrointestinal: Negative for abdominal pain, vomiting or diarrhea. Genitourinary: Negative for dysuria. Musculoskeletal: Negative for back pain. Skin: Negative for rash. Neurological: Negative for headaches, weakness or numbness. Psych: No SI or HI  ____________________________________________   PHYSICAL EXAM:  VITAL SIGNS: ED Triage Vitals  Enc Vitals Group     BP 07/08/18 1125 (!) 159/93     Pulse Rate 07/08/18 1125 91     Resp 07/08/18 1125 (!) 25     Temp 07/08/18 1125 98.1 F (36.7 C)     Temp src --      SpO2 07/08/18 1125 96 %     Weight 07/08/18 1126 263 lb (119.3 kg)     Height 07/08/18 1126 5\' 7"  (1.702 m)     Head Circumference --      Peak Flow --      Pain Score 07/08/18 1126 0     Pain Loc --      Pain Edu? --      Excl. in GC? --     Constitutional: Alert and oriented. Well appearing and in no apparent distress. HEENT:      Head: Normocephalic and atraumatic.         Eyes: Conjunctivae are normal. Sclera is non-icteric.       Mouth/Throat: Mucous membranes are moist.       Neck: Supple with no signs of meningismus. Cardiovascular: Regular rate and rhythm. No murmurs, gallops, or rubs. 2+ symmetrical distal pulses are present in all extremities. No JVD. Respiratory: Tachypnea, increased work of breathing, satting 96% on 2 L nasal cannula, decreased air movement worse on the right with diffuse expiratory wheezes bilateral  Gastrointestinal: Soft, non tender, and non distended with positive bowel sounds. No rebound or guarding. Musculoskeletal: Nontender with normal range of motion in all extremities. No edema, cyanosis, or erythema of  extremities. Neurologic: Normal speech and language. Face is symmetric. Moving all extremities. No gross focal neurologic deficits are appreciated. Skin: Skin is warm, dry and intact. No rash noted. Psychiatric: Mood and affect are normal. Speech and behavior are normal.  ____________________________________________   LABS (all labs ordered are listed, but only abnormal results are displayed)  Labs Reviewed  CBC WITH DIFFERENTIAL/PLATELET - Abnormal; Notable for the following components:      Result Value   RDW 15.2 (*)    All other components within normal limits  BASIC METABOLIC PANEL - Abnormal; Notable for the following components:   Glucose, Bld 149 (*)    BUN 24 (*)  Creatinine, Ser 1.47 (*)    GFR calc non Af Amer 40 (*)    GFR calc Af Amer 46 (*)    All other components within normal limits  TROPONIN I  URINE DRUG SCREEN, QUALITATIVE ([facility] ONLY)   ____________________________________________  EKG  ED ECG REPORT I, Nita Sicklearolina Kynzie Polgar, the attending physician, personally viewed and interpreted this ECG.  Normal sinus rhythm, rate of 86, normal intervals, normal axis, no ST elevations or depressions, LVH.  Unchanged from prior ____________________________________________  RADIOLOGY  I have personally reviewed the images performed during this visit and I agree with the Radiologist's read.   Interpretation by Radiologist:  Dg Chest Portable 1 View  Result Date: 07/08/2018 CLINICAL DATA:  Acute shortness of breath EXAM: PORTABLE CHEST 1 VIEW COMPARISON:  06/27/2018 and prior radiograph FINDINGS: UPPER limits normal heart size noted. Mild peribronchial thickening is unchanged. There is no evidence of focal airspace disease, pulmonary edema, suspicious pulmonary nodule/mass, pleural effusion, or pneumothorax. No acute bony abnormalities are identified. IMPRESSION: No evidence of acute cardiopulmonary disease. Electronically Signed   By: Harmon PierJeffrey  Hu M.D.   On: 07/08/2018  12:16      ____________________________________________   PROCEDURES  Procedure(s) performed: None Procedures Critical Care performed:  None ____________________________________________   INITIAL IMPRESSION / ASSESSMENT AND PLAN / ED COURSE   52 y.o. female with a history of COPD, asthma, diabetes, hypertension, smoking who presents for evaluation of shortness of breath and cough.  Patient arrives in moderate respiratory distress, tachypnea, decreased air movement worse on the right with diffuse expiratory wheezes.  Patient uses oxygen at nighttime only but has required it all day since yesterday.  She is currently on 2 L and satting 96%.  Presentation concerning for COPD/asthma exacerbation versus pneumonia.  No history of CHF, patient looks euvolemic.  Low suspicion for PE.  Will check drug screen as patient has a history of cocaine abuse.  Will give DuoNeb's, Solu-Medrol was given by EMS, IV magnesium.  Labs and chest x-ray are pending.  EKG is pending.  Clinical Course as of Jul 08 1308  Sun Jul 08, 2018  1306 Patient is breathing a lot more comfortably after 3 DuoNeb's, remain with normal sats, still has some persistent wheezing however she is requesting to be discharged.  Offered to put patient on an hour of continuous to prevent her from having to return the patient is refusing and says she has to go.  Her labs did not show any changes from baseline.  Chest x-ray does not show any pneumonia.  Patient has had no fever and normal white count therefore will not start her on antibiotics.  Will give a prescription for prednisone.  She does have inhalers at home.  Discussed close follow-up with primary care doctor or return to the emergency room for worsening shortness of breath, difficulty breathing, chest pain.   [CV]    Clinical Course User Index [CV] Don PerkingVeronese, [place], MD     As part of my medical decision making, I reviewed the following data within the electronic MEDICAL RECORD NUMBER  Nursing notes reviewed and incorporated, Labs reviewed , EKG interpreted , Old EKG reviewed, Old chart reviewed, Radiograph reviewed , Notes from prior ED visits and [place] Controlled Substance Database    Pertinent labs & imaging results that were available during my care of the patient were reviewed by me and considered in my medical decision making (see chart for details).    ____________________________________________   FINAL CLINICAL IMPRESSION(S) / ED  DIAGNOSES  Final diagnoses:  COPD exacerbation (HCC)      NEW MEDICATIONS STARTED DURING THIS VISIT:  ED Discharge Orders        Ordered    predniSONE (DELTASONE) 20 MG tablet  Daily     07/08/18 1308       Note:  This document was prepared using Dragon voice recognition software and may include unintentional dictation errors.    Don Perking, [place], MD 07/08/18 1309

## 2018-07-08 NOTE — ED Notes (Signed)
Pt ambulatory upon discharge; declined wheel chair. Pt still wheezing bilaterally. Insisting on going home. Denies shortness of breath. Denies pain. Verbalized understanding of discharge instructions, prescription and follow-up care. VSS. Skin warm and dry. A&O x4.

## 2018-07-08 NOTE — ED Triage Notes (Signed)
Pt arrived via EMS from home with reports of breathing difficulty for the past several weeks, worse today.  Pt has had productive cough with yellow sputum. Hx of COPD and asthma.   Pt given 1 duoneb, 1 albuterol with EMS and 125mg  of Solumedrol.  Oxygen sats 96%.

## 2018-07-18 DIAGNOSIS — I10 Essential (primary) hypertension: Secondary | ICD-10-CM | POA: Diagnosis not present

## 2018-07-18 DIAGNOSIS — F17200 Nicotine dependence, unspecified, uncomplicated: Secondary | ICD-10-CM | POA: Diagnosis not present

## 2018-07-18 DIAGNOSIS — R7309 Other abnormal glucose: Secondary | ICD-10-CM | POA: Diagnosis not present

## 2018-07-18 DIAGNOSIS — J449 Chronic obstructive pulmonary disease, unspecified: Secondary | ICD-10-CM | POA: Diagnosis not present

## 2018-07-30 ENCOUNTER — Inpatient Hospital Stay
Admission: EM | Admit: 2018-07-30 | Discharge: 2018-08-01 | DRG: 190 | Disposition: A | Discharge status: Home / Self Care | Payer: Medicare Other | Attending: Internal Medicine | Admitting: Internal Medicine

## 2018-07-30 ENCOUNTER — Emergency Department: Payer: Medicare Other

## 2018-07-30 ENCOUNTER — Other Ambulatory Visit: Payer: Self-pay

## 2018-07-30 ENCOUNTER — Encounter: Payer: Self-pay | Admitting: Emergency Medicine

## 2018-07-30 DIAGNOSIS — F17210 Nicotine dependence, cigarettes, uncomplicated: Secondary | ICD-10-CM | POA: Diagnosis present

## 2018-07-30 DIAGNOSIS — Z7951 Long term (current) use of inhaled steroids: Secondary | ICD-10-CM

## 2018-07-30 DIAGNOSIS — J9601 Acute respiratory failure with hypoxia: Secondary | ICD-10-CM | POA: Diagnosis present

## 2018-07-30 DIAGNOSIS — Z79899 Other long term (current) drug therapy: Secondary | ICD-10-CM

## 2018-07-30 DIAGNOSIS — N183 Chronic kidney disease, stage 3 (moderate): Secondary | ICD-10-CM | POA: Diagnosis present

## 2018-07-30 DIAGNOSIS — Z9981 Dependence on supplemental oxygen: Secondary | ICD-10-CM

## 2018-07-30 DIAGNOSIS — I129 Hypertensive chronic kidney disease with stage 1 through stage 4 chronic kidney disease, or unspecified chronic kidney disease: Secondary | ICD-10-CM | POA: Diagnosis present

## 2018-07-30 DIAGNOSIS — J441 Chronic obstructive pulmonary disease with (acute) exacerbation: Secondary | ICD-10-CM

## 2018-07-30 DIAGNOSIS — Z720 Tobacco use: Secondary | ICD-10-CM | POA: Diagnosis not present

## 2018-07-30 DIAGNOSIS — Z7984 Long term (current) use of oral hypoglycemic drugs: Secondary | ICD-10-CM

## 2018-07-30 DIAGNOSIS — I10 Essential (primary) hypertension: Secondary | ICD-10-CM | POA: Diagnosis not present

## 2018-07-30 DIAGNOSIS — E1122 Type 2 diabetes mellitus with diabetic chronic kidney disease: Secondary | ICD-10-CM | POA: Diagnosis present

## 2018-07-30 DIAGNOSIS — Z885 Allergy status to narcotic agent status: Secondary | ICD-10-CM | POA: Diagnosis not present

## 2018-07-30 DIAGNOSIS — E119 Type 2 diabetes mellitus without complications: Secondary | ICD-10-CM | POA: Diagnosis not present

## 2018-07-30 DIAGNOSIS — R0600 Dyspnea, unspecified: Secondary | ICD-10-CM | POA: Diagnosis not present

## 2018-07-30 DIAGNOSIS — R0602 Shortness of breath: Secondary | ICD-10-CM | POA: Diagnosis not present

## 2018-07-30 LAB — BLOOD GAS, VENOUS
ACID-BASE DEFICIT: 1.5 mmol/L (ref 0.0–2.0)
BICARBONATE: 24.3 mmol/L (ref 20.0–28.0)
O2 Saturation: 79.2 %
PH VEN: 7.35 (ref 7.250–7.430)
Patient temperature: 37
pCO2, Ven: 44 mmHg (ref 44.0–60.0)
pO2, Ven: 46 mmHg — ABNORMAL HIGH (ref 32.0–45.0)

## 2018-07-30 LAB — CBC
HEMATOCRIT: 43.7 % (ref 35.0–47.0)
HEMOGLOBIN: 14.4 g/dL (ref 12.0–16.0)
MCH: 29.3 pg (ref 26.0–34.0)
MCHC: 33 g/dL (ref 32.0–36.0)
MCV: 88.6 fL (ref 80.0–100.0)
Platelets: 301 10*3/uL (ref 150–440)
RBC: 4.93 MIL/uL (ref 3.80–5.20)
RDW: 14.9 % — AB (ref 11.5–14.5)
WBC: 9.5 10*3/uL (ref 3.6–11.0)

## 2018-07-30 LAB — GLUCOSE, CAPILLARY
Glucose-Capillary: 241 mg/dL — ABNORMAL HIGH (ref 70–99)
Glucose-Capillary: 195 mg/dL — ABNORMAL HIGH (ref 70–99)

## 2018-07-30 LAB — BASIC METABOLIC PANEL
ANION GAP: 8 (ref 5–15)
BUN: 15 mg/dL (ref 6–20)
CO2: 25 mmol/L (ref 22–32)
Calcium: 9.3 mg/dL (ref 8.9–10.3)
Chloride: 109 mmol/L (ref 98–111)
Creatinine, Ser: 1.45 mg/dL — ABNORMAL HIGH (ref 0.44–1.00)
GFR calc Af Amer: 47 mL/min — ABNORMAL LOW (ref >60)
GFR, EST NON AFRICAN AMERICAN: 40 mL/min — AB (ref >60)
Glucose, Bld: 141 mg/dL — ABNORMAL HIGH (ref 70–99)
POTASSIUM: 4 mmol/L (ref 3.5–5.1)
Sodium: 142 mmol/L (ref 135–145)

## 2018-07-30 LAB — TROPONIN I: Troponin I: <0.03 ng/mL (ref <0.03)

## 2018-07-30 MED ORDER — ACETAMINOPHEN 325 MG PO TABS
650.0000 mg | ORAL_TABLET | Freq: Four times a day (QID) | ORAL | Status: DC | PRN
  Administered 2018-07-31 (×2): 650 mg via ORAL
  Filled 2018-07-30 (×2): qty 2

## 2018-07-30 MED ORDER — METHYLPREDNISOLONE SODIUM SUCC 125 MG IJ SOLR
60.0000 mg | Freq: Four times a day (QID) | INTRAMUSCULAR | Status: DC
  Administered 2018-07-30 – 2018-08-01 (×7): 60 mg via INTRAVENOUS
  Filled 2018-07-30 (×7): qty 2

## 2018-07-30 MED ORDER — SENNOSIDES-DOCUSATE SODIUM 8.6-50 MG PO TABS: 1.0000 | ORAL_TABLET | Freq: Every evening | ORAL | Status: DC | PRN

## 2018-07-30 MED ORDER — INSULIN ASPART 100 UNIT/ML ~~LOC~~ SOLN: 0.0000 [IU] | Freq: Every day | SUBCUTANEOUS | Status: DC

## 2018-07-30 MED ORDER — NICOTINE 14 MG/24HR TD PT24: 14.0000 mg | MEDICATED_PATCH | Freq: Every day | TRANSDERMAL | Status: DC

## 2018-07-30 MED ORDER — HYDRALAZINE HCL 20 MG/ML IJ SOLN
10.0000 mg | Freq: Four times a day (QID) | INTRAMUSCULAR | Status: DC | PRN
  Administered 2018-07-30: 10 mg via INTRAVENOUS
  Filled 2018-07-30: qty 1

## 2018-07-30 MED ORDER — SODIUM CHLORIDE 0.9% FLUSH: 3.0000 mL | INTRAVENOUS | Status: DC | PRN

## 2018-07-30 MED ORDER — ALBUTEROL SULFATE (2.5 MG/3ML) 0.083% IN NEBU
2.5000 mg | INHALATION_SOLUTION | RESPIRATORY_TRACT | Status: DC | PRN
  Administered 2018-07-30 (×2): 2.5 mg via RESPIRATORY_TRACT
  Filled 2018-07-30 (×3): qty 3

## 2018-07-30 MED ORDER — SODIUM CHLORIDE 0.9 % IV SOLN: 250.0000 mL | INTRAVENOUS | Status: DC | PRN

## 2018-07-30 MED ORDER — IPRATROPIUM-ALBUTEROL 0.5-2.5 (3) MG/3ML IN SOLN
3.0000 mL | Freq: Once | RESPIRATORY_TRACT | Status: AC
  Administered 2018-07-30: 3 mL via RESPIRATORY_TRACT

## 2018-07-30 MED ORDER — LOSARTAN POTASSIUM-HCTZ 50-12.5 MG PO TABS: 1.0000 | ORAL_TABLET | Freq: Every day | ORAL | Status: DC

## 2018-07-30 MED ORDER — NICOTINE 14 MG/24HR TD PT24
14.0000 mg | MEDICATED_PATCH | Freq: Every day | TRANSDERMAL | Status: DC
  Administered 2018-07-30 – 2018-08-01 (×3): 14 mg via TRANSDERMAL
  Filled 2018-07-30 (×3): qty 1

## 2018-07-30 MED ORDER — GUAIFENESIN-DM 100-10 MG/5ML PO SYRP: 5.0000 mL | ORAL_SOLUTION | ORAL | Status: DC | PRN

## 2018-07-30 MED ORDER — IPRATROPIUM-ALBUTEROL 0.5-2.5 (3) MG/3ML IN SOLN
RESPIRATORY_TRACT | Status: AC
  Administered 2018-07-30: 3 mL via RESPIRATORY_TRACT
  Filled 2018-07-30: qty 6

## 2018-07-30 MED ORDER — ONDANSETRON HCL 4 MG/2ML IJ SOLN: 4.0000 mg | Freq: Four times a day (QID) | INTRAMUSCULAR | Status: DC | PRN

## 2018-07-30 MED ORDER — ACETAMINOPHEN 650 MG RE SUPP: 650.0000 mg | Freq: Four times a day (QID) | RECTAL | Status: DC | PRN

## 2018-07-30 MED ORDER — LOSARTAN POTASSIUM 50 MG PO TABS
50.0000 mg | ORAL_TABLET | Freq: Every day | ORAL | Status: DC
  Administered 2018-07-30 – 2018-08-01 (×3): 50 mg via ORAL
  Filled 2018-07-30 (×3): qty 1

## 2018-07-30 MED ORDER — SODIUM CHLORIDE 0.9% FLUSH
3.0000 mL | Freq: Two times a day (BID) | INTRAVENOUS | Status: DC
  Administered 2018-07-30 – 2018-07-31 (×3): 3 mL via INTRAVENOUS

## 2018-07-30 MED ORDER — ONDANSETRON HCL 4 MG PO TABS: 4.0000 mg | ORAL_TABLET | Freq: Four times a day (QID) | ORAL | Status: DC | PRN

## 2018-07-30 MED ORDER — INSULIN ASPART 100 UNIT/ML ~~LOC~~ SOLN
0.0000 [IU] | Freq: Three times a day (TID) | SUBCUTANEOUS | Status: DC
  Administered 2018-07-30 – 2018-07-31 (×2): 3 [IU] via SUBCUTANEOUS
  Filled 2018-07-30 (×2): qty 1

## 2018-07-30 MED ORDER — MONTELUKAST SODIUM 10 MG PO TABS
10.0000 mg | ORAL_TABLET | Freq: Every day | ORAL | Status: DC
  Administered 2018-07-30 – 2018-07-31 (×2): 10 mg via ORAL
  Filled 2018-07-30 (×2): qty 1

## 2018-07-30 MED ORDER — AMLODIPINE BESYLATE 10 MG PO TABS
10.0000 mg | ORAL_TABLET | Freq: Every day | ORAL | Status: DC
  Administered 2018-07-30 – 2018-08-01 (×3): 10 mg via ORAL
  Filled 2018-07-30 (×3): qty 1

## 2018-07-30 MED ORDER — MOMETASONE FURO-FORMOTEROL FUM 200-5 MCG/ACT IN AERO
2.0000 | INHALATION_SPRAY | Freq: Two times a day (BID) | RESPIRATORY_TRACT | Status: DC
  Administered 2018-07-30 – 2018-07-31 (×3): 2 via RESPIRATORY_TRACT
  Filled 2018-07-30: qty 8.8

## 2018-07-30 MED ORDER — ENOXAPARIN SODIUM 40 MG/0.4ML ~~LOC~~ SOLN
40.0000 mg | SUBCUTANEOUS | Status: DC
  Administered 2018-07-30: 40 mg via SUBCUTANEOUS
  Filled 2018-07-30: qty 0.4

## 2018-07-30 MED ORDER — BISACODYL 5 MG PO TBEC: 5.0000 mg | DELAYED_RELEASE_TABLET | Freq: Every day | ORAL | Status: DC | PRN

## 2018-07-30 MED ORDER — TRAMADOL HCL 50 MG PO TABS: 50.0000 mg | ORAL_TABLET | Freq: Four times a day (QID) | ORAL | Status: DC | PRN

## 2018-07-30 MED ORDER — IPRATROPIUM-ALBUTEROL 0.5-2.5 (3) MG/3ML IN SOLN
3.0000 mL | Freq: Four times a day (QID) | RESPIRATORY_TRACT | Status: DC
  Administered 2018-07-30 – 2018-08-01 (×6): 3 mL via RESPIRATORY_TRACT
  Filled 2018-07-30 (×8): qty 3

## 2018-07-30 MED ORDER — HYDROCHLOROTHIAZIDE 12.5 MG PO CAPS
12.5000 mg | ORAL_CAPSULE | Freq: Every day | ORAL | Status: DC
  Administered 2018-07-30 – 2018-08-01 (×3): 12.5 mg via ORAL
  Filled 2018-07-30 (×3): qty 1

## 2018-07-30 MED ORDER — GABAPENTIN 300 MG PO CAPS
300.0000 mg | ORAL_CAPSULE | Freq: Two times a day (BID) | ORAL | Status: DC
  Administered 2018-07-30 – 2018-08-01 (×4): 300 mg via ORAL
  Filled 2018-07-30 (×4): qty 1

## 2018-07-30 NOTE — ED Provider Notes (Addendum)
[facility] Emergency Department Provider Note  Time seen: 11:25 AM  I have reviewed the triage vital signs and the nursing notes.   HISTORY  Chief Complaint Shortness of Breath    HPI Kiani A Lensing is a 52 y.o. female with a past medical history of asthma, COPD, diabetes, hypertension presents to the emergency department for shortness of breath.  According to the patient for the past 3 days she has had progressively worsening shortness of breath has been coughing with yellow sputum production.  Denies any fever.  States significant wheeze yesterday and into today.  Patient wears 2 L of oxygen at night only, states she has not been doing so recently however.  Denies any chest pain.  Largely negative review of systems otherwise.  Describes her shortness of breath as moderate.  EMS gave the patient 125 mg of Solu-Medrol, had 2 g of magnesium hanging and received albuterol breathing treatments in route to the hospital.   Past Medical History:  Diagnosis Date  . Acute on chronic respiratory failure with hypoxemia (HCC) 08/30/2015  . Asthma   . COPD (chronic obstructive pulmonary disease) (HCC)   . Diabetes mellitus without complication (HCC)   . Hypertension     Patient Active Problem List   Diagnosis Date Noted  . COPD exacerbation (HCC) 02/21/2018  . Acute on chronic respiratory failure (HCC) 10/06/2015  . Acute on chronic respiratory failure with hypoxemia (HCC) 08/30/2015    Past Surgical History:  Procedure Laterality Date  . none      Prior to Admission medications   Medication Sig Start Date End Date Taking? Authorizing Provider  albuterol (PROVENTIL HFA;VENTOLIN HFA) 108 (90 Base) MCG/ACT inhaler Inhale 2 puffs into the lungs every 6 (six) hours as needed for wheezing or shortness of breath. 09/19/17   Merrily Brittle, MD  albuterol (PROVENTIL HFA;VENTOLIN HFA) 108 (90 Base) MCG/ACT inhaler Inhale 2 puffs into the lungs every 6 (six) hours as  needed for wheezing or shortness of breath. 06/27/18   Willy Eddy, MD  budesonide-formoterol [facility]) 160-4.5 MCG/ACT inhaler Inhale 2 puffs 2 (two) times daily into the lungs. 10/30/17   Emily Filbert, MD  gabapentin (NEURONTIN) 300 MG capsule Take 300 mg by mouth 2 (two) times daily.    [provider]  glimepiride (AMARYL) 4 MG tablet Take 4 mg by mouth daily.    [provider]  guaiFENesin-dextromethorphan (ROBITUSSIN DM) 100-10 MG/5ML syrup Take 5 mLs by mouth every 4 (four) hours as needed for cough. 02/22/18   Houston Siren, MD  losartan-hydrochlorothiazide (HYZAAR) 50-12.5 MG tablet Take 1 tablet by mouth daily. 02/22/16   [provider]  metFORMIN (GLUCOPHAGE) 850 MG tablet Take 1 tablet by mouth 2 (two) times daily with a meal.  12/11/15   [provider]  montelukast (SINGULAIR) 10 MG tablet Take 10 mg by mouth at bedtime.     [provider]  predniSONE (DELTASONE) 10 MG tablet Label  & dispense according to the schedule below. 5 Pills PO for 1 day then, 4 Pills PO for 1 day, 3 Pills PO for 1 day, 2 Pills PO for 1 day, 1 Pill PO for 1 days then STOP. 02/22/18   Houston Siren, MD    Allergies  Allergen Reactions  . Percocet [Oxycodone-Acetaminophen] Itching    Family History  Problem Relation Age of Onset  . Asthma Mother   . Diabetes Mother   . Hypertension Mother   . Asthma Sister   .  Heart attack Father     Social History Social History   Tobacco Use  . Smoking status: Current Every Day Smoker    Packs/day: 0.10    Years: 23.00    Pack years: 2.30    Types: Cigarettes  . Smokeless tobacco: Never Used  Substance Use Topics  . Alcohol use: Yes  . Drug use: Yes    Types: Cocaine    Comment: 2 weeks ago snorted it.      Review of Systems Constitutional: Negative for fever. Eyes: Negative for visual complaints ENT: cough/congestion Cardiovascular: Negative for chest pain. Respiratory: Moderate  shortness of breath.  Positive for cough with yellow sputum production Gastrointestinal: Negative for abdominal pain Genitourinary: Negative for urinary compaints Musculoskeletal: Negative for leg pain or swelling Skin: Negative for skin complaints  Neurological: Negative for headache All other ROS negative  ____________________________________________   PHYSICAL EXAM:  VITAL SIGNS: ED Triage Vitals  Enc Vitals Group     BP 07/30/18 1109 (!) 167/99     Pulse Rate 07/30/18 1107 97     Resp 07/30/18 1107 (!) 22     Temp 07/30/18 1107 98.2 F (36.8 C)     Temp Source 07/30/18 1107 Oral     SpO2 07/30/18 1107 99 %     Weight 07/30/18 1106 256 lb (116.1 kg)     Height 07/30/18 1106 5\' 7"  (1.702 m)     Head Circumference --      Peak Flow --      Pain Score 07/30/18 1107 0     Pain Loc --      Pain Edu? --      Excl. in GC? --    Constitutional: Alert and oriented. Well appearing and in no distress. Eyes: Normal exam ENT   Head: Normocephalic and atraumatic.   Mouth/Throat: Mucous membranes are moist. Cardiovascular: Normal rate, regular rhythm. No murmur Respiratory: Mild tachypnea, moderate expiratory wheeze bilaterally.  No obvious rhonchi. Gastrointestinal: Soft and nontender. No distention.  Musculoskeletal: Nontender with normal range of motion in all extremities.  No lower extremity edema or tenderness. Neurologic:  Normal speech and language. No gross focal neurologic deficits are appreciated. Skin:  Skin is warm, dry and intact.  Psychiatric: Mood and affect are normal. Speech and behavior are normal.   EKG reviewed and interpreted by myself shows normal sinus rhythm at 98 bpm with a narrow QRS, normal axis, normal intervals, no concerning ST changes  RADIOLOGY  Chest x-ray negative  ____________________________________________   INITIAL IMPRESSION / ASSESSMENT AND PLAN / ED COURSE  Pertinent labs & imaging results that were available during my care of  the patient were reviewed by me and considered in my medical decision making (see chart for details).  Patient presents to the emergency department for shortness of breath cough congestion worsening over the past 3 days.  Differential would include asthma exacerbation, COPD exacerbation, pneumonia, URI.  We will check labs, obtain a portable chest x-ray.  Patient is receiving duo nebs in the emergency department received albuterol with EMS, 125 mg of Solu-Medrol by EMS as well as 2 g of magnesium.  Currently patient is sitting in bed with breathing treatments but is not in any distress watching TV, has already stated that she would like to go home and does not want to be admitted if at all possible.  She continues to have moderate wheeze despite breathing treatments.  Satting 92 to 94% on 2 L of oxygen.  Given her  continued wheeze with lower oxygen saturation we will admit to the hospital service for COPD exacerbation.  Patient agreeable to plan of care. ____________________________________________   FINAL CLINICAL IMPRESSION(S) / ED DIAGNOSES  Dyspnea COPD exacerbation   Minna AntisPaduchowski, Vitaliy Eisenhour, MD 07/30/18 1332    Minna AntisPaduchowski, [place] Pelto, MD 07/30/18 1408

## 2018-07-30 NOTE — ED Notes (Signed)
.   Pt is resting, Respirations even and unlabored, NAD. Stretcher lowest postion and locked. Call bell within reach. Denies any needs at this time RN will continue to monitor.    

## 2018-07-30 NOTE — H&P (Signed)
[facility] [place] at [facility]   PATIENT NAME: Samantha [name] Petrides    MR#:  [id number]  DATE OF BIRTH:  Feb 27, 1966  DATE OF ADMISSION:  07/30/2018  PRIMARY CARE PHYSICIAN: [facility]   REQUESTING/REFERRING PHYSICIAN: Minna AntisPaduchowski, Kevin, MD  CHIEF COMPLAINT:   Chief Complaint  Patient presents with  . Shortness of Breath   Shortness breath, wheezing and cough for 3 days. HISTORY OF PRESENT ILLNESS:  Samantha [name] Toney  is a 52 y.o. female with a known history of COPD, hypertension and diabetes.  The patient presents the ED with above chief complaints.  She has had worsening shortness of breath, wheezing and a cough for the past 3 days.  She.  wears 2 L of oxygen at night only at home.  She denies any fever or chills, no orthopnea or nocturnal dyspnea or leg edema.  She is treated with IV throat mitral, IV magnesium and nebulizer without improvement in the ED.  ED physician request admission.  PAST MEDICAL HISTORY:   Past Medical History:  Diagnosis Date  . Acute on chronic respiratory failure with hypoxemia (HCC) 08/30/2015  . Asthma   . COPD (chronic obstructive pulmonary disease) (HCC)   . Diabetes mellitus without complication (HCC)   . Hypertension     PAST SURGICAL HISTORY:   Past Surgical History:  Procedure Laterality Date  . none      SOCIAL HISTORY:   Social History   Tobacco Use  . Smoking status: Current Every Day Smoker    Packs/day: 0.10    Years: 23.00    Pack years: 2.30    Types: Cigarettes  . Smokeless tobacco: Never Used  Substance Use Topics  . Alcohol use: Yes    FAMILY HISTORY:   Family History  Problem Relation Age of Onset  . Asthma Mother   . Diabetes Mother   . Hypertension Mother   . Asthma Sister   . Heart attack Father     DRUG ALLERGIES:   Allergies  Allergen Reactions  . Percocet [Oxycodone-Acetaminophen] Itching    REVIEW OF SYSTEMS:   Review of Systems  Constitutional:  Negative for chills, fever and malaise/fatigue.  HENT: Negative for sore throat.   Eyes: Negative for blurred vision and double vision.  Respiratory: Positive for cough, sputum production, shortness of breath and wheezing. Negative for hemoptysis and stridor.   Cardiovascular: Negative for chest pain, palpitations, orthopnea and leg swelling.  Gastrointestinal: Negative for abdominal pain, blood in stool, diarrhea, melena, nausea and vomiting.  Genitourinary: Negative for dysuria, flank pain and hematuria.  Musculoskeletal: Negative for back pain and joint pain.  Skin: Negative for rash.  Neurological: Negative for dizziness, sensory change, focal weakness, seizures, loss of consciousness, weakness and headaches.  Endo/Heme/Allergies: Negative for polydipsia.  Psychiatric/Behavioral: Negative for depression. The patient is not nervous/anxious.     MEDICATIONS AT HOME:   Prior to Admission medications   Medication Sig Start Date End Date Taking? Authorizing Provider  albuterol (PROVENTIL HFA;VENTOLIN HFA) 108 (90 Base) MCG/ACT inhaler Inhale 2 puffs into the lungs every 6 (six) hours as needed for wheezing or shortness of breath. 06/27/18  Yes Willy Eddyobinson, Patrick, MD  budesonide-formoterol [facility](SYMBICORT) 160-4.5 MCG/ACT inhaler Inhale 2 puffs 2 (two) times daily into the lungs. 10/30/17  Yes Emily FilbertWilliams, Jonathan E, MD  gabapentin (NEURONTIN) 300 MG capsule Take 300 mg by mouth 2 (two) times daily.   Yes [provider]  glimepiride (AMARYL) 4 MG tablet Take 4 mg  by mouth daily.   Yes [provider]  losartan-hydrochlorothiazide (HYZAAR) 50-12.5 MG tablet Take 1 tablet by mouth daily. 02/22/16  Yes [provider]  metFORMIN (GLUCOPHAGE) 850 MG tablet Take 1 tablet by mouth 2 (two) times daily with a meal.  12/11/15  Yes [provider]  montelukast (SINGULAIR) 10 MG tablet Take 10 mg by mouth at bedtime.    Yes [provider]  albuterol (PROVENTIL  HFA;VENTOLIN HFA) 108 (90 Base) MCG/ACT inhaler Inhale 2 puffs into the lungs every 6 (six) hours as needed for wheezing or shortness of breath. Patient not taking: Reported on 07/30/2018 09/19/17   Merrily Brittle, MD  guaiFENesin-dextromethorphan (ROBITUSSIN DM) 100-10 MG/5ML syrup Take 5 mLs by mouth every 4 (four) hours as needed for cough. Patient not taking: Reported on 07/30/2018 02/22/18   Houston Siren, MD  predniSONE (DELTASONE) 10 MG tablet Label  & dispense according to the schedule below. 5 Pills PO for 1 day then, 4 Pills PO for 1 day, 3 Pills PO for 1 day, 2 Pills PO for 1 day, 1 Pill PO for 1 days then STOP. Patient not taking: Reported on 07/30/2018 02/22/18   Houston Siren, MD      VITAL SIGNS:  Blood pressure (!) 160/95, pulse 95, temperature 98.2 F (36.8 C), temperature source Oral, resp. rate 18, height 5\' 7"  (1.702 m), weight 116.1 kg, SpO2 92 %.  PHYSICAL EXAMINATION:  Physical Exam  GENERAL:  52 y.o.-year-old patient lying in the bed with no acute distress.  EYES: Pupils equal, round, reactive to light and accommodation. No scleral icterus. Extraocular muscles intact.  HEENT: Head atraumatic, normocephalic. Oropharynx and nasopharynx clear.  NECK:  Supple, no jugular venous distention. No thyroid enlargement, no tenderness.  LUNGS: Bilateral expiratory wheezing, no rales,rhonchi or crepitation. No use of accessory muscles of respiration.  CARDIOVASCULAR: S1, S2 normal. No murmurs, rubs, or gallops.  ABDOMEN: Soft, nontender, nondistended. Bowel sounds present. No organomegaly or mass.  EXTREMITIES: No pedal edema, cyanosis, or clubbing.  NEUROLOGIC: Cranial nerves II through XII are intact. Muscle strength 5/5 in all extremities. Sensation intact. Gait not checked.  PSYCHIATRIC: The patient is alert and oriented x 3.  SKIN: No obvious rash, lesion, or ulcer.   LABORATORY PANEL:   CBC Recent Labs  Lab 07/30/18 1104  WBC 9.5  HGB 14.4  HCT 43.7  PLT 301    ------------------------------------------------------------------------------------------------------------------  Chemistries  Recent Labs  Lab 07/30/18 1104  NA 142  K 4.0  CL 109  CO2 25  GLUCOSE 141*  BUN 15  CREATININE 1.45*  CALCIUM 9.3   ------------------------------------------------------------------------------------------------------------------  Cardiac Enzymes Recent Labs  Lab 07/30/18 1104  TROPONINI <0.03   ------------------------------------------------------------------------------------------------------------------  RADIOLOGY:  Dg Chest Portable 1 View  Result Date: 07/30/2018 CLINICAL DATA:  Shortness of breath for 2 days. EXAM: PORTABLE CHEST 1 VIEW COMPARISON:  Single-view of the chest 07/08/2018 and 02/19/2017. PA and lateral chest 06/27/2018. FINDINGS: The lungs are clear. Heart size is normal. No pneumothorax or pleural effusion. No acute or focal bony abnormality. IMPRESSION: No acute disease. Electronically Signed   By: Drusilla Kanner M.D.   On: 07/30/2018 12:06      IMPRESSION AND PLAN:   Acute respiratory failure with hypoxia COPD exacerbation. The patient will be admitted to medical floor. DuoNeb every 6 hours, IV Solu-Medrol, Dulera, Robitussin as needed. Continue oxygen by nasal cannula.  The patient uses oxygen as needed at night.  Diabetes.  Hold metformin and Amaryl, start  sliding scale.  Hypertension.  Not on hypertension medication at home, start Norvasc, IV hydralazine PRN.  CKD stage III.  Stable.  Tobacco abuse.  Smoking cessation was counseled for 3 to 4 minutes, nicotine patch.   All the records are reviewed and case discussed with ED provider. Management plans discussed with the patient, family and they are in agreement.  CODE STATUS: Full code.  TOTAL TIME TAKING CARE OF THIS PATIENT: 45 minutes.    Shaune PollackQing Janis Sol M.D on 07/30/2018 at 2:13 PM  Between 7am to 6pm - Pager - [phone]  After 6pm go to  [email or web address] - Scientist, research (life sciences)password EPAS [facility]  [facility] [place] [facility]  Office  [phone]  CC: Primary care physician; [facility]   Note: This dictation was prepared with Nurse, children'sDragon dictation along with smaller phrase technology. Any transcriptional errors that result from this process are unin

## 2018-07-30 NOTE — ED Notes (Signed)
Patient transported to room 158 by this EDT.  

## 2018-07-30 NOTE — ED Notes (Signed)
Pt is resting with NRB on. Pt is in NAD resting awiating EDP at this time.

## 2018-07-30 NOTE — ED Triage Notes (Signed)
Pt presents to ED via [facility] from home c/o SOB x2 days. Pt reports has used her home neb and inhaler today and it did not help. Pt given 125mg  Solu-Medrol, x1 albuterol tx, x1 duoneb tx, and 2G Mag Sulfate running. EMS report wheezing upper and lower. Initial RA sat 88%. Pt arrives on neb mask, sat 99%.

## 2018-07-30 NOTE — ED Notes (Signed)
Pt called out stating could not breathe RN assessed PT 02 sat was 93% RA This RN placed pt on  2L. RN will monitor.

## 2018-07-31 LAB — GLUCOSE, CAPILLARY
Glucose-Capillary: 239 mg/dL — ABNORMAL HIGH (ref 70–99)
Glucose-Capillary: 237 mg/dL — ABNORMAL HIGH (ref 70–99)
Glucose-Capillary: 230 mg/dL — ABNORMAL HIGH (ref 70–99)
Glucose-Capillary: 213 mg/dL — ABNORMAL HIGH (ref 70–99)

## 2018-07-31 LAB — BASIC METABOLIC PANEL
Anion gap: 8 (ref 5–15)
BUN: 19 mg/dL (ref 6–20)
CHLORIDE: 107 mmol/L (ref 98–111)
CO2: 23 mmol/L (ref 22–32)
Calcium: 9.7 mg/dL (ref 8.9–10.3)
Creatinine, Ser: 1.27 mg/dL — ABNORMAL HIGH (ref 0.44–1.00)
GFR calc non Af Amer: 47 mL/min — ABNORMAL LOW (ref >60)
GFR, EST AFRICAN AMERICAN: 55 mL/min — AB (ref >60)
Glucose, Bld: 212 mg/dL — ABNORMAL HIGH (ref 70–99)
Potassium: 4.4 mmol/L (ref 3.5–5.1)
Sodium: 138 mmol/L (ref 135–145)

## 2018-07-31 LAB — CBC
HEMATOCRIT: 46.7 % (ref 35.0–47.0)
Hemoglobin: 15.6 g/dL (ref 12.0–16.0)
MCH: 29.4 pg (ref 26.0–34.0)
MCHC: 33.5 g/dL (ref 32.0–36.0)
MCV: 87.8 fL (ref 80.0–100.0)
Platelets: 325 10*3/uL (ref 150–440)
RBC: 5.32 MIL/uL — AB (ref 3.80–5.20)
RDW: 15 % — ABNORMAL HIGH (ref 11.5–14.5)
WBC: 9.3 10*3/uL (ref 3.6–11.0)

## 2018-07-31 LAB — MAGNESIUM: Magnesium: 2.5 mg/dL — ABNORMAL HIGH (ref 1.7–2.4)

## 2018-07-31 MED ORDER — INSULIN ASPART 100 UNIT/ML ~~LOC~~ SOLN
0.0000 [IU] | Freq: Every day | SUBCUTANEOUS | Status: DC
  Administered 2018-07-31: 2 [IU] via SUBCUTANEOUS
  Filled 2018-07-31: qty 1

## 2018-07-31 MED ORDER — GLIMEPIRIDE 4 MG PO TABS
4.0000 mg | ORAL_TABLET | Freq: Every day | ORAL | Status: DC
  Filled 2018-07-31: qty 1

## 2018-07-31 MED ORDER — INSULIN GLARGINE 100 UNIT/ML ~~LOC~~ SOLN
10.0000 [IU] | Freq: Every day | SUBCUTANEOUS | Status: DC
  Filled 2018-07-31 (×2): qty 0.1

## 2018-07-31 MED ORDER — ENOXAPARIN SODIUM 40 MG/0.4ML ~~LOC~~ SOLN
40.0000 mg | Freq: Two times a day (BID) | SUBCUTANEOUS | Status: DC
  Administered 2018-07-31 (×2): 40 mg via SUBCUTANEOUS
  Filled 2018-07-31 (×3): qty 0.4

## 2018-07-31 MED ORDER — INSULIN ASPART 100 UNIT/ML ~~LOC~~ SOLN
0.0000 [IU] | Freq: Three times a day (TID) | SUBCUTANEOUS | Status: DC
  Administered 2018-07-31 – 2018-08-01 (×2): 5 [IU] via SUBCUTANEOUS
  Filled 2018-07-31 (×2): qty 1

## 2018-07-31 NOTE — Progress Notes (Signed)
PHARMACIST - PHYSICIAN COMMUNICATION  CONCERNING:  Enoxaparin (Lovenox) for DVT Prophylaxis    RECOMMENDATION: Patient was prescribed enoxaprin 40mg  q24 hours for VTE prophylaxis.   Filed Weights   07/30/18 1106  Weight: 256 lb (116.1 kg)    Body mass index is 40.1 kg/m.  Estimated Creatinine Clearance: 67.4 mL/min (A) (by C-G formula based on SCr of 1.27 mg/dL (H)).   [facility] patient is candidate for enoxaparin 40mg  every 12 hour dosing due to BMI being >40.   DESCRIPTION: Pharmacy has adjusted [facility]aparin dose per [facility], approved through P & T committee.  Patient is now receiving enoxaparin 40mg  every 12 hours.   Pricilla RiffleAbby K Ellington, PharmD Pharmacy Resident  07/31/2018 9:59 AM

## 2018-07-31 NOTE — Progress Notes (Addendum)
Inpatient Diabetes Program Recommendations  AACE/ADA: New Consensus Statement on Inpatient Glycemic Control (2019)  Target Ranges:  Prepandial:   less than 140 mg/dL      Peak postprandial:   less than 180 mg/dL (1-2 hours)      Critically ill patients:  140 - 180 mg/dL   Results for [facility], [facility] A (MRN [id number]) as of 07/31/2018 09:39  Ref. Range 07/30/2018 17:16 07/30/2018 21:02 07/31/2018 07:32  Glucose-Capillary Latest Ref Range: 70 - 99 mg/dL 409241 (H) 811195 (H) 914239 (H)   Review of Glycemic Control  Diabetes history: DM2 Outpatient Diabetes medications: Amaryl 4 mg daily, Metformin 850 mg BID Current orders for Inpatient glycemic control: Novolog 0-9 units TID with meals, Novolog 0-5 units QHS; Solumedrol 60 mg Q6H  Inpatient Diabetes Program Recommendations:  Insulin-Basal: If steroids are continued, please consider ordering Lantus 10 units Q24H starting now. Insulin-Correction: Please consider increasing Novolog to Moderate correction scale.  Thanks, Orlando PennerMarie Dian Minahan, RN, MSN, CDE Diabetes Coordinator Inpatient Diabetes Program [phone] (Team Pager from 8am to 5pm)

## 2018-07-31 NOTE — Progress Notes (Signed)
[facility] - [place] [facility]   PATIENT NAME: Samantha SchimkeDeffiney [name]    MR#:  [id number]  DATE OF BIRTH:  Aug 01, 1966  SUBJECTIVE:  CHIEF COMPLAINT:   Chief Complaint  Patient presents with  . Shortness of Breath  Patient feeling better, still with O2 requirement during the day-typically only wears oxygen at night  REVIEW OF SYSTEMS:  CONSTITUTIONAL: No fever, fatigue or weakness.  EYES: No blurred or double vision.  EARS, NOSE, AND THROAT: No tinnitus or ear pain.  RESPIRATORY: No cough, shortness of breath, wheezing or hemoptysis.  CARDIOVASCULAR: No chest pain, orthopnea, edema.  GASTROINTESTINAL: No nausea, vomiting, diarrhea or abdominal pain.  GENITOURINARY: No dysuria, hematuria.  ENDOCRINE: No polyuria, nocturia,  HEMATOLOGY: No anemia, easy bruising or bleeding SKIN: No rash or lesion. MUSCULOSKELETAL: No joint pain or arthritis.   NEUROLOGIC: No tingling, numbness, weakness.  PSYCHIATRY: No anxiety or depression.   ROS  DRUG ALLERGIES:   Allergies  Allergen Reactions  . Percocet [Oxycodone-Acetaminophen] Itching    VITALS:  Blood pressure (!) 146/92, pulse 84, temperature 97.8 F (36.6 C), temperature source Oral, resp. rate 20, height 5\' 7"  (1.702 m), weight 116.1 kg, SpO2 98 %.  PHYSICAL EXAMINATION:  GENERAL:  52 y.o.-year-old patient lying in the bed with no acute distress.  EYES: Pupils equal, round, reactive to light and accommodation. No scleral icterus. Extraocular muscles intact.  HEENT: Head atraumatic, normocephalic. Oropharynx and nasopharynx clear.  NECK:  Supple, no jugular venous distention. No thyroid enlargement, no tenderness.  LUNGS: Normal breath sounds bilaterally, no wheezing, rales,rhonchi or crepitation. No use of accessory muscles of respiration.  CARDIOVASCULAR: S1, S2 normal. No murmurs, rubs, or gallops.  ABDOMEN: Soft, nontender, nondistended. Bowel sounds present. No organomegaly or mass.  EXTREMITIES: No pedal  edema, cyanosis, or clubbing.  NEUROLOGIC: Cranial nerves II through XII are intact. Muscle strength 5/5 in all extremities. Sensation intact. Gait not checked.  PSYCHIATRIC: The patient is alert and oriented x 3.  SKIN: No obvious rash, lesion, or ulcer.   Physical Exam LABORATORY PANEL:   CBC Recent Labs  Lab 07/31/18 0400  WBC 9.3  HGB 15.6  HCT 46.7  PLT 325   ------------------------------------------------------------------------------------------------------------------  Chemistries  Recent Labs  Lab 07/31/18 0400  NA 138  K 4.4  CL 107  CO2 23  GLUCOSE 212*  BUN 19  CREATININE 1.27*  CALCIUM 9.7  MG 2.5*   ------------------------------------------------------------------------------------------------------------------  Cardiac Enzymes Recent Labs  Lab 07/30/18 1104  TROPONINI <0.03   ------------------------------------------------------------------------------------------------------------------  RADIOLOGY:  Dg Chest Portable 1 View  Result Date: 07/30/2018 CLINICAL DATA:  Shortness of breath for 2 days. EXAM: PORTABLE CHEST 1 VIEW COMPARISON:  Single-view of the chest 07/08/2018 and 02/19/2017. PA and lateral chest 06/27/2018. FINDINGS: The lungs are clear. Heart size is normal. No pneumothorax or pleural effusion. No acute or focal bony abnormality. IMPRESSION: No acute disease. Electronically Signed   By: Drusilla Kannerhomas  Dalessio M.D.   On: 07/30/2018 12:06    ASSESSMENT AND PLAN:  *Acute hypoxic respiratory failure  Due to acute on COPD exacerbation Resolving Continue aggressive pulmonary toilet with bronchodilator therapy, IV Solu-Medrol with tapering as tolerated, Dulera, mucolytic agents, supplemental oxygen weaning back to baseline of 2 L via nasal cannula at night only if possible, and continue close medical monitoring   *Acute on chronic COPD exacerbation  Resolving  Plan of care per above   *Chronic diabetes mellitus type 2  Continue to hold  metformin while in house  Restart  Amaryl, continue sliding scale insulin with Accu-Cheks per routine   *Hypertension  Stable  Continue Norvasc   *CKD stage III Stable Avoid nephrotoxic agents  *Chronic tobacco smoking abuse/dependency Stable Cessation counseling ordered, nicotine patch daily  Disposition home tomorrow with resumption of home oxygen barring any complications  All the records are reviewed and case discussed with Care Management/Social Workerr. Management plans discussed with the patient, family and they are in agreement.  CODE STATUS:full  TOTAL TIME TAKING CARE OF THIS PATIENT: 35 minutes.     POSSIBLE D/C IN 1 DAYS, DEPENDING ON CLINICAL CONDITION.   Evelena AsaMontell D Salary M.D on 07/31/2018   Between 7am to 6pm - Pager - [phone]  After 6pm go to [email or web address] - Social research officer, governmentpassword EPAS [facility]  [facility] [place] [facility]  Office  [phone]  CC: Primary care physician; [facility]  Note: This dictation was prepared with Nurse, children'sDragon dictation along with smaller phrase technology. Any transcriptional errors that result from this process are unintentional.

## 2018-08-01 LAB — GLUCOSE, CAPILLARY: Glucose-Capillary: 245 mg/dL — ABNORMAL HIGH (ref 70–99)

## 2018-08-01 MED ORDER — PREDNISONE 20 MG PO TABS: 40.0000 mg | ORAL_TABLET | Freq: Every day | ORAL | 0 refills | Status: AC

## 2018-08-01 MED ORDER — AMLODIPINE BESYLATE 10 MG PO TABS: 10.0000 mg | ORAL_TABLET | Freq: Every day | ORAL | 1 refills | Status: DC

## 2018-08-01 MED ORDER — GUAIFENESIN-DM 100-10 MG/5ML PO SYRP: 5.0000 mL | ORAL_SOLUTION | ORAL | 0 refills | Status: DC | PRN

## 2018-08-01 MED ORDER — ALBUTEROL SULFATE HFA 108 (90 BASE) MCG/ACT IN AERS: 2.0000 | INHALATION_SPRAY | Freq: Four times a day (QID) | RESPIRATORY_TRACT | 2 refills | Status: DC | PRN

## 2018-08-01 MED ORDER — NICOTINE 14 MG/24HR TD PT24: 14.0000 mg | MEDICATED_PATCH | Freq: Every day | TRANSDERMAL | 0 refills | Status: DC

## 2018-08-01 NOTE — Discharge Summary (Signed)
[facility] - [place] at [facility]   PATIENT NAME: Samantha SchimkeDeffiney [name]    MR#:  [id number]  DATE OF BIRTH:  08/06/1966  DATE OF ADMISSION:  07/30/2018   ADMITTING PHYSICIAN: Shaune PollackQing Shamarie Call, MD  DATE OF DISCHARGE: 08/01/2018  PRIMARY CARE PHYSICIAN: [facility]   ADMISSION DIAGNOSIS:  Difficulty Breathing  DISCHARGE DIAGNOSIS:  Active Problems:   COPD exacerbation (HCC)  SECONDARY DIAGNOSIS:   Past Medical History:  Diagnosis Date  . Acute on chronic respiratory failure with hypoxemia (HCC) 08/30/2015  . Asthma   . COPD (chronic obstructive pulmonary disease) (HCC)   . Diabetes mellitus without complication (HCC)   . Hypertension    HOSPITAL COURSE:  *Acute hypoxic respiratory failure  Due to acute on COPD exacerbation Resolving Continue aggressive pulmonary toilet with bronchodilator therapy, change IV Solu-Medrol to prednisone, on Dulera, mucolytic agents, supplemental oxygen weaning back to baseline of 2 L via nasal cannula at night.  *Acute on chronic COPD exacerbation  Resolving  Plan of care per above   *Chronic diabetes mellitus type 2  Continue to hold metformin while in house  Restart Amaryl, continue sliding scale insulin with Accu-Cheks per routine   *Hypertension  Stable  Continue Norvasc   *CKD stage III Stable Avoid nephrotoxic agents  *Chronic tobacco smoking abuse/dependency Stable Cessation counseling ordered, nicotine patch daily  DISCHARGE CONDITIONS:  Stable, discharge to home today. CONSULTS OBTAINED:   DRUG ALLERGIES:   Allergies  Allergen Reactions  . Percocet [Oxycodone-Acetaminophen] Itching   DISCHARGE MEDICATIONS:   Allergies as of 08/01/2018      Reactions   Percocet [oxycodone-acetaminophen] Itching      Medication List    TAKE these medications   albuterol 108 (90 Base) MCG/ACT inhaler Commonly known as:  PROVENTIL HFA;VENTOLIN HFA Inhale 2 puffs into the lungs every 6  (six) hours as needed for wheezing or shortness of breath. What changed:  Another medication with the same name was removed. Continue taking this medication, and follow the directions you see here.   amLODipine 10 MG tablet Commonly known as:  NORVASC Take 1 tablet (10 mg total) by mouth daily.   budesonide-formoterol 160-4.5 MCG/ACT inhaler Commonly known as:  SYMBICORT Inhale 2 puffs 2 (two) times daily into the lungs.   gabapentin 300 MG capsule Commonly known as:  NEURONTIN Take 300 mg by mouth 2 (two) times daily.   glimepiride 4 MG tablet Commonly known as:  AMARYL Take 4 mg by mouth daily.   guaiFENesin-dextromethorphan 100-10 MG/5ML syrup Commonly known as:  ROBITUSSIN DM Take 5 mLs by mouth every 4 (four) hours as needed for cough.   losartan-hydrochlorothiazide 50-12.5 MG tablet Commonly known as:  HYZAAR Take 1 tablet by mouth daily.   metFORMIN 850 MG tablet Commonly known as:  GLUCOPHAGE Take 1 tablet by mouth 2 (two) times daily with a meal.   montelukast 10 MG tablet Commonly known as:  SINGULAIR Take 10 mg by mouth at bedtime.   nicotine 14 mg/24hr patch Commonly known as:  NICODERM CQ - dosed in mg/24 hours Place 1 patch (14 mg total) onto the skin daily.   predniSONE 20 MG tablet Commonly known as:  DELTASONE Take 2 tablets (40 mg total) by mouth daily with breakfast for 4 days. What changed:    medication strength  how much to take  how to take this  when to take this  additional instructions        DISCHARGE INSTRUCTIONS:  See  AVS.  If you experience worsening of your admission symptoms, develop shortness of breath, life threatening emergency, suicidal or homicidal thoughts you must seek medical attention immediately by calling 911 or calling your MD immediately  if symptoms less severe.  You Must read complete instructions/literature along with all the possible adverse reactions/side effects for all the Medicines you take and that  have been prescribed to you. Take any new Medicines after you have completely understood and accpet all the possible adverse reactions/side effects.   Please note  You were cared for by a hospitalist during your hospital stay. If you have any questions about your discharge medications or the care you received while you were in the hospital after you are discharged, you can call the unit and asked to speak with the hospitalist on call if the hospitalist that took care of you is not available. Once you are discharged, your primary care physician will handle any further medical issues. Please note that NO REFILLS for any discharge medications will be authorized once you are discharged, as it is imperative that you return to your primary care physician (or establish a relationship with a primary care physician if you do not have one) for your aftercare needs so that they can reassess your need for medications and monitor your lab values.    On the day of Discharge:  VITAL SIGNS:  Blood pressure (!) 163/94, pulse 97, temperature 97.9 F (36.6 C), temperature source Oral, resp. rate 19, height 5\' 7"  (1.702 m), weight 116.1 kg, SpO2 97 %. PHYSICAL EXAMINATION:  GENERAL:  52 y.o.-year-old patient lying in the bed with no acute distress. Morbid obesity. EYES: Pupils equal, round, reactive to light and accommodation. No scleral icterus. Extraocular muscles intact.  HEENT: Head atraumatic, normocephalic. Oropharynx and nasopharynx clear.  NECK:  Supple, no jugular venous distention. No thyroid enlargement, no tenderness.  LUNGS: Normal breath sounds bilaterally, no wheezing, rales,rhonchi or crepitation. No use of accessory muscles of respiration.  CARDIOVASCULAR: S1, S2 normal. No murmurs, rubs, or gallops.  ABDOMEN: Soft, non-tender, non-distended. Bowel sounds present. No organomegaly or mass.  EXTREMITIES: No pedal edema, cyanosis, or clubbing.  NEUROLOGIC: Cranial nerves II through XII are intact.  Muscle strength 5/5 in all extremities. Sensation intact. Gait not checked.  PSYCHIATRIC: The patient is alert and oriented x 3.  SKIN: No obvious rash, lesion, or ulcer.  DATA REVIEW:   CBC Recent Labs  Lab 07/31/18 0400  WBC 9.3  HGB 15.6  HCT 46.7  PLT 325    Chemistries  Recent Labs  Lab 07/31/18 0400  NA 138  K 4.4  CL 107  CO2 23  GLUCOSE 212*  BUN 19  CREATININE 1.27*  CALCIUM 9.7  MG 2.5*     Microbiology Results  Results for orders placed or performed during the hospital encounter of 12/04/17  Group A Strep by PCR ([facility] Only)     Status: None   Collection Time: 12/04/17  1:13 PM  Result Value Ref Range Status   Group A Strep by PCR NOT DETECTED NOT DETECTED Final    Comment: Performed at [facility], [address] [place], [place] [address]    RADIOLOGY:  No results found.   Management plans discussed with the patient, family and they are in agreement.  CODE STATUS: Full Code   TOTAL TIME TAKING CARE OF THIS PATIENT: 35 minutes.    Shaune Pollack M.D on 08/01/2018 at 10:39 AM  Between 7am to 6pm - Pager -  [phone]  After 6pm go to [email or web address] - Scientist, research (life sciences)password EPAS [facility]  [facility] [place] [facility]  Office  [phone]  CC: Primary care physician; [facility]   Note: This dictation was prepared with Nurse, children'sDragon dictation along with smaller phrase technology. Any transcriptional errors that result from this process are unintentional.

## 2018-08-01 NOTE — Care Management (Signed)
Met with patient at bedside. She is requesting assistance paying for her Proventil and Prednisone at discharge. Patient states she doesn't have the $3 for each prescription. She states she has exhausted all resources.  Received $6 from [facility] and petty cash. Gave to patient for her prescriptions. She was appreciative.

## 2018-08-01 NOTE — Progress Notes (Signed)
CN reviewed AVS in preparation for transportation.

## 2018-08-01 NOTE — Discharge Instructions (Signed)
Smoking cessation  

## 2018-08-12 ENCOUNTER — Other Ambulatory Visit: Payer: Self-pay

## 2018-08-12 ENCOUNTER — Emergency Department: Payer: Medicare HMO

## 2018-08-12 ENCOUNTER — Emergency Department
Admission: EM | Admit: 2018-08-12 | Discharge: 2018-08-12 | Disposition: A | Discharge status: Home / Self Care | Payer: Medicare HMO | Attending: Emergency Medicine | Admitting: Emergency Medicine

## 2018-08-12 DIAGNOSIS — R918 Other nonspecific abnormal finding of lung field: Secondary | ICD-10-CM | POA: Diagnosis not present

## 2018-08-12 DIAGNOSIS — F17210 Nicotine dependence, cigarettes, uncomplicated: Secondary | ICD-10-CM | POA: Diagnosis not present

## 2018-08-12 DIAGNOSIS — R0602 Shortness of breath: Secondary | ICD-10-CM | POA: Diagnosis not present

## 2018-08-12 DIAGNOSIS — J441 Chronic obstructive pulmonary disease with (acute) exacerbation: Secondary | ICD-10-CM | POA: Diagnosis not present

## 2018-08-12 DIAGNOSIS — Z79899 Other long term (current) drug therapy: Secondary | ICD-10-CM | POA: Insufficient documentation

## 2018-08-12 DIAGNOSIS — E119 Type 2 diabetes mellitus without complications: Secondary | ICD-10-CM | POA: Insufficient documentation

## 2018-08-12 DIAGNOSIS — Z7984 Long term (current) use of oral hypoglycemic drugs: Secondary | ICD-10-CM | POA: Insufficient documentation

## 2018-08-12 DIAGNOSIS — I10 Essential (primary) hypertension: Secondary | ICD-10-CM | POA: Diagnosis not present

## 2018-08-12 LAB — BASIC METABOLIC PANEL
ANION GAP: 7 (ref 5–15)
BUN: 20 mg/dL (ref 6–20)
CO2: 26 mmol/L (ref 22–32)
Calcium: 8.4 mg/dL — ABNORMAL LOW (ref 8.9–10.3)
Chloride: 108 mmol/L (ref 98–111)
Creatinine, Ser: 1.46 mg/dL — ABNORMAL HIGH (ref 0.44–1.00)
GFR calc Af Amer: 46 mL/min — ABNORMAL LOW (ref >60)
GFR, EST NON AFRICAN AMERICAN: 40 mL/min — AB (ref >60)
GLUCOSE: 152 mg/dL — AB (ref 70–99)
POTASSIUM: 4.2 mmol/L (ref 3.5–5.1)
Sodium: 141 mmol/L (ref 135–145)

## 2018-08-12 LAB — CBC
HEMATOCRIT: 42.1 % (ref 35.0–47.0)
Hemoglobin: 14.1 g/dL (ref 12.0–16.0)
MCH: 29.6 pg (ref 26.0–34.0)
MCHC: 33.4 g/dL (ref 32.0–36.0)
MCV: 88.6 fL (ref 80.0–100.0)
PLATELETS: 323 10*3/uL (ref 150–440)
RBC: 4.75 MIL/uL (ref 3.80–5.20)
RDW: 14.9 % — AB (ref 11.5–14.5)
WBC: 11.6 10*3/uL — ABNORMAL HIGH (ref 3.6–11.0)

## 2018-08-12 MED ORDER — PREDNISONE 20 MG PO TABS
40.0000 mg | ORAL_TABLET | Freq: Once | ORAL | Status: AC
  Administered 2018-08-12: 40 mg via ORAL
  Filled 2018-08-12: qty 2

## 2018-08-12 MED ORDER — METHYLPREDNISOLONE SODIUM SUCC 125 MG IJ SOLR
125.0000 mg | INTRAMUSCULAR | Status: AC
  Administered 2018-08-12: 125 mg via INTRAVENOUS
  Filled 2018-08-12: qty 2

## 2018-08-12 MED ORDER — IPRATROPIUM-ALBUTEROL 0.5-2.5 (3) MG/3ML IN SOLN
3.0000 mL | Freq: Once | RESPIRATORY_TRACT | Status: AC
  Administered 2018-08-12: 3 mL via RESPIRATORY_TRACT
  Filled 2018-08-12: qty 3

## 2018-08-12 MED ORDER — MAGNESIUM SULFATE IN D5W 1-5 GM/100ML-% IV SOLN
1.0000 g | Freq: Once | INTRAVENOUS | Status: AC
  Administered 2018-08-12: 1 g via INTRAVENOUS
  Filled 2018-08-12: qty 100

## 2018-08-12 MED ORDER — PREDNISONE 20 MG PO TABS: 40.0000 mg | ORAL_TABLET | Freq: Every day | ORAL | 0 refills | Status: DC

## 2018-08-12 NOTE — ED Triage Notes (Addendum)
Per EMS pt was ambulatory to EMS truck and stated she needed a steroid treatment. Upon arrival pt able to speak in complete sentences, labored breathing. EMS reports pt gave herself nebulizer tx at home along with inhaler tx. History of diabetes, Ems states pt refused to have blood sugar checked,

## 2018-08-12 NOTE — Discharge Instructions (Signed)
We believe that your symptoms are caused today by an exacerbation of your COPD, and possibly bronchitis.  Please take the prescribed medications and any medications that you have at home for your COPD.  Follow up with your doctor as recommended.  If you develop any new or worsening symptoms, including but not limited to fever, persistent vomiting, worsening shortness of breath, or other symptoms that concern you, please return to the Emergency Department immediately. ° °

## 2018-08-12 NOTE — ED Notes (Addendum)
Patient ambulated to bathroom with no problems. O2 sats remained at 94% and above

## 2018-08-12 NOTE — ED Provider Notes (Signed)
[facility] Emergency Department Provider Note  ____________________________________________   First MD Initiated Contact with Patient 08/12/18 [phone]     (approximate)  I have reviewed the triage vital signs and the nursing notes.   HISTORY  Chief Complaint Shortness of Breath  HPI Samantha [name] is a 52 y.o. female a history of COPD and asthma and diabetes  Patient reports she was discharged Thursday with a COPD attack but did not quite feel back to baseline when she was discharged.  She reports she was not sent home with any prescriptions steroids except for 4 pills that they gave her a day she was discharged, and over the last day and a half she has had ongoing cough, wheezing, and took 3 breathing treatments at home this morning without improvement using her albuterol and called 911 for further assistance.  She reports she is very short of breath when she walks, not quite as bad sitting still.  She is still smoking.  No chest pain.  No leg swelling.  Reports she is had the same symptoms many times diagnosed as COPD, though also she had asthma that was similar as a child.  No fevers or chills.  Cough is frequent and occasionally productive.  Past Medical History:  Diagnosis Date  . Acute on chronic respiratory failure with hypoxemia (HCC) 08/30/2015  . Asthma   . COPD (chronic obstructive pulmonary disease) (HCC)   . Diabetes mellitus without complication (HCC)   . Hypertension     Patient Active Problem List   Diagnosis Date Noted  . COPD exacerbation (HCC) 02/21/2018  . Acute on chronic respiratory failure (HCC) 10/06/2015  . Acute on chronic respiratory failure with hypoxemia (HCC) 08/30/2015    Past Surgical History:  Procedure Laterality Date  . none      Prior to Admission medications   Medication Sig Start Date End Date Taking? Authorizing Provider  albuterol (PROVENTIL HFA;VENTOLIN HFA) 108 (90 Base) MCG/ACT inhaler Inhale 2 puffs  into the lungs every 6 (six) hours as needed for wheezing or shortness of breath. 08/01/18   Shaune Pollack, MD  amLODipine (NORVASC) 10 MG tablet Take 1 tablet (10 mg total) by mouth daily. 08/01/18   Shaune Pollack, MD  budesonide-formoterol [facility]) 160-4.5 MCG/ACT inhaler Inhale 2 puffs 2 (two) times daily into the lungs. 10/30/17   Emily Filbert, MD  gabapentin (NEURONTIN) 300 MG capsule Take 300 mg by mouth 2 (two) times daily.    [provider]  glimepiride (AMARYL) 4 MG tablet Take 4 mg by mouth daily.    [provider]  guaiFENesin-dextromethorphan (ROBITUSSIN DM) 100-10 MG/5ML syrup Take 5 mLs by mouth every 4 (four) hours as needed for cough. 08/01/18   Shaune Pollack, MD  losartan-hydrochlorothiazide [facility]) 50-12.5 MG tablet Take 1 tablet by mouth daily. 02/22/16   [provider]  metFORMIN (GLUCOPHAGE) 850 MG tablet Take 1 tablet by mouth 2 (two) times daily with a meal.  12/11/15   [provider]  montelukast (SINGULAIR) 10 MG tablet Take 10 mg by mouth at bedtime.     [provider]  nicotine (NICODERM CQ - DOSED IN MG/24 HOURS) 14 mg/24hr patch Place 1 patch (14 mg total) onto the skin daily. 08/01/18   Shaune Pollack, MD  predniSONE (DELTASONE) 20 MG tablet Take 2 tablets (40 mg total) by mouth daily with breakfast. 08/12/18   Sharyn Creamer, MD    Allergies Percocet [oxycodone-acetaminophen]  Family History  Problem Relation Age of  Onset  . Asthma Mother   . Diabetes Mother   . Hypertension Mother   . Asthma Sister   . Heart attack Father     Social History Social History   Tobacco Use  . Smoking status: Current Every Day Smoker    Packs/day: 0.10    Years: 23.00    Pack years: 2.30    Types: Cigarettes  . Smokeless tobacco: Never Used  Substance Use Topics  . Alcohol use: Yes  . Drug use: Yes    Types: Cocaine    Comment: 2 weeks ago snorted it.      Review of Systems Constitutional: No fever/chills Eyes: No visual  changes. ENT: No sore throat. Cardiovascular: Denies chest pain. Respiratory: See HPI gastrointestinal: No abdominal pain.  No nausea, no vomiting.  No diarrhea.  No constipation. Genitourinary: Negative for dysuria. Musculoskeletal: Negative for back pain. Skin: Negative for rash. Neurological: Negative for headaches, focal weakness or numbness.    ____________________________________________   PHYSICAL EXAM:  VITAL SIGNS: ED Triage Vitals  Enc Vitals Group     BP 08/12/18 0824 (!) 191/116     Pulse Rate 08/12/18 0824 72     Resp 08/12/18 0824 (!) 21     Temp 08/12/18 0824 97.7 F (36.5 C)     Temp Source 08/12/18 0824 Oral     SpO2 08/12/18 0818 97 %     Weight 08/12/18 0826 256 lb (116.1 kg)     Height 08/12/18 0826 5\' 7"  (1.702 m)     Head Circumference --      Peak Flow --      Pain Score 08/12/18 0825 7     Pain Loc --      Pain Edu? --      Excl. in GC? --     Constitutional: Alert and oriented.  Mildly dyspneic appearing.  Eyes: Conjunctivae are normal. Head: Atraumatic. Nose: No congestion/rhinnorhea. Mouth/Throat: Mucous membranes are moist. Neck: No stridor.   Cardiovascular: Normal rate, regular rhythm. Grossly normal heart sounds.  Good peripheral circulation. Respiratory: Slightly tachypneic.  Speaking in phrases.  There is diffuse and mild end expiratory wheezing.  No inspiratory wheezing.  No crackles. Gastrointestinal: Soft and nontender. No distention. Musculoskeletal: No lower extremity tenderness nor edema. Neurologic:  Normal speech and language. No gross focal neurologic deficits are appreciated.  Skin:  Skin is warm, dry and intact. No rash noted. Psychiatric: Mood and affect are normal. Speech and behavior are normal.  ____________________________________________   LABS (all labs ordered are listed, but only abnormal results are displayed)  Labs Reviewed  CBC - Abnormal; Notable for the following components:      Result Value   WBC 11.6  (*)    RDW 14.9 (*)    All other components within normal limits  BASIC METABOLIC PANEL - Abnormal; Notable for the following components:   Glucose, Bld 152 (*)    Creatinine, Ser 1.46 (*)    Calcium 8.4 (*)    GFR calc non Af Amer 40 (*)    GFR calc Af Amer 46 (*)    All other components within normal limits   ____________________________________________  EKG  Reviewed and entered by me at 8:20 AM Heart rate 80 QRS 80 QTc 460 Normal sinus rhythm, no evidence of acute ischemia ____________________________________________  RADIOLOGY   Chest x-ray reviewed, negative for acute.  Mild hyperinflation. ____________________________________________   PROCEDURES  Procedure(s) performed: None  Procedures  Critical Care performed: No  ____________________________________________  INITIAL IMPRESSION / ASSESSMENT AND PLAN / ED COURSE  Pertinent labs & imaging results that were available during my care of the patient were reviewed by me and considered in my medical decision making (see chart for details).  Patient presenting for dyspnea.  Has a history of COPD with recent treatment, also reports a history of asthma.  Clinical Course as of Aug 12 953  Wynelle Link Aug 12, 2018  1610 Reports feels much better now. Alert, speaking clealry with normal respirations and 98% on room air. Reports she would like to be discharged and is much better now. Will Rx additional 3 days of prednisone (appears some confusion that resulted in her not having prednidone at home on discharge). Have clarified and will Rx additional 3 days plus dose here today. Patient agreeable. Lungs now clear. Improved. SBP 160, patient reports that she had not taken her blood pressure medication today but will do so when she gets home.  [MQ]    Clinical Course User Index [MQ] Sharyn Creamer, MD   ----------------------------------------- 9:54 AM on 08/12/2018 -----------------------------------------  Patient notified  myself that she needs to be discharged now, she is much better and her ride is here to pick her up and she otherwise does not have a ride home.  Discussed with her that I would like to have her do some walking and measure her oxygen level before she goes to make sure that she is had ample time to be certain that she is improved prior to discharge but patient reports she must go now.  Offered to evaluate for alternative brands including helping her find a different ride home later, but patient reports she would like to go now and does not wish to stay for any further care.  I will give her prednisone and additional 3 days of prescription prednisone as she has been on steroids recently, discussed careful return precautions with the patient.  She does indeed appear much better.  Resting comfortably in no distress able to speak on her phone and speak in full clear sentences with clear lungs at this time.  She reports she has ample nebulizers at home does not need those prescribed.  Return precautions and treatment recommendations and follow-up discussed with the patient who is agreeable with the plan.   ____________________________________________   FINAL CLINICAL IMPRESSION(S) / ED DIAGNOSES  Final diagnoses:  COPD exacerbation (HCC)      NEW MEDICATIONS STARTED DURING THIS VISIT:  New Prescriptions   PREDNISONE (DELTASONE) 20 MG TABLET    Take 2 tablets (40 mg total) by mouth daily with breakfast.     Note:  This document was prepared using Dragon voice recognition software and may include unintentional dictation errors.     Sharyn Creamer, MD 08/12/18 [phone]

## 2018-08-12 NOTE — ED Notes (Signed)
Rainbow sent to lab at this time.  

## 2018-08-13 DIAGNOSIS — J45998 Other asthma: Secondary | ICD-10-CM | POA: Diagnosis not present

## 2018-09-04 DIAGNOSIS — J4540 Moderate persistent asthma, uncomplicated: Secondary | ICD-10-CM | POA: Diagnosis not present

## 2018-09-12 DIAGNOSIS — J45998 Other asthma: Secondary | ICD-10-CM | POA: Diagnosis not present

## 2018-09-27 ENCOUNTER — Emergency Department
Admission: EM | Admit: 2018-09-27 | Discharge: 2018-09-28 | Disposition: A | Discharge status: Home / Self Care | Payer: Medicare HMO | Attending: Emergency Medicine | Admitting: Emergency Medicine

## 2018-09-27 ENCOUNTER — Other Ambulatory Visit: Payer: Self-pay

## 2018-09-27 ENCOUNTER — Encounter: Payer: Self-pay | Admitting: Emergency Medicine

## 2018-09-27 ENCOUNTER — Emergency Department: Payer: Medicare HMO

## 2018-09-27 DIAGNOSIS — I10 Essential (primary) hypertension: Secondary | ICD-10-CM | POA: Insufficient documentation

## 2018-09-27 DIAGNOSIS — E119 Type 2 diabetes mellitus without complications: Secondary | ICD-10-CM | POA: Diagnosis not present

## 2018-09-27 DIAGNOSIS — R05 Cough: Secondary | ICD-10-CM | POA: Diagnosis not present

## 2018-09-27 DIAGNOSIS — J441 Chronic obstructive pulmonary disease with (acute) exacerbation: Secondary | ICD-10-CM | POA: Insufficient documentation

## 2018-09-27 DIAGNOSIS — Z79899 Other long term (current) drug therapy: Secondary | ICD-10-CM | POA: Insufficient documentation

## 2018-09-27 DIAGNOSIS — R0602 Shortness of breath: Secondary | ICD-10-CM | POA: Diagnosis not present

## 2018-09-27 DIAGNOSIS — F17210 Nicotine dependence, cigarettes, uncomplicated: Secondary | ICD-10-CM | POA: Diagnosis not present

## 2018-09-27 DIAGNOSIS — Z7984 Long term (current) use of oral hypoglycemic drugs: Secondary | ICD-10-CM | POA: Diagnosis not present

## 2018-09-27 LAB — CBC WITH DIFFERENTIAL/PLATELET
ABS IMMATURE GRANULOCYTES: 0.05 10*3/uL (ref 0.00–0.07)
BASOS PCT: 1 %
Basophils Absolute: 0.1 10*3/uL (ref 0.0–0.1)
EOS PCT: 4 %
Eosinophils Absolute: 0.4 10*3/uL (ref 0.0–0.5)
HCT: 44.9 % (ref 36.0–46.0)
HEMOGLOBIN: 14.6 g/dL (ref 12.0–15.0)
Immature Granulocytes: 0 %
Lymphocytes Relative: 37 %
Lymphs Abs: 4.3 10*3/uL — ABNORMAL HIGH (ref 0.7–4.0)
MCH: 29.3 pg (ref 26.0–34.0)
MCHC: 32.5 g/dL (ref 30.0–36.0)
MCV: 90 fL (ref 80.0–100.0)
MONO ABS: 0.8 10*3/uL (ref 0.1–1.0)
MONOS PCT: 7 %
Neutro Abs: 6.1 10*3/uL (ref 1.7–7.7)
Neutrophils Relative %: 51 %
PLATELETS: 377 10*3/uL (ref 150–400)
RBC: 4.99 MIL/uL (ref 3.87–5.11)
RDW: 13.8 % (ref 11.5–15.5)
WBC: 11.7 10*3/uL — ABNORMAL HIGH (ref 4.0–10.5)
nRBC: 0 % (ref 0.0–0.2)

## 2018-09-27 LAB — COMPREHENSIVE METABOLIC PANEL
ALT: 43 U/L (ref 0–44)
ANION GAP: 8 (ref 5–15)
AST: 26 U/L (ref 15–41)
Albumin: 4 g/dL (ref 3.5–5.0)
Alkaline Phosphatase: 76 U/L (ref 38–126)
BUN: 22 mg/dL — ABNORMAL HIGH (ref 6–20)
CO2: 28 mmol/L (ref 22–32)
CREATININE: 1.51 mg/dL — AB (ref 0.44–1.00)
Calcium: 9.3 mg/dL (ref 8.9–10.3)
Chloride: 105 mmol/L (ref 98–111)
GFR, EST AFRICAN AMERICAN: 44 mL/min — AB (ref >60)
GFR, EST NON AFRICAN AMERICAN: 38 mL/min — AB (ref >60)
Glucose, Bld: 146 mg/dL — ABNORMAL HIGH (ref 70–99)
POTASSIUM: 3.8 mmol/L (ref 3.5–5.1)
SODIUM: 141 mmol/L (ref 135–145)
Total Bilirubin: 0.7 mg/dL (ref 0.3–1.2)
Total Protein: 6.9 g/dL (ref 6.5–8.1)

## 2018-09-27 LAB — TROPONIN I

## 2018-09-27 MED ORDER — ALBUTEROL SULFATE (2.5 MG/3ML) 0.083% IN NEBU: 5.0000 mg | INHALATION_SOLUTION | Freq: Once | RESPIRATORY_TRACT | Status: DC

## 2018-09-27 MED ORDER — AZITHROMYCIN 500 MG PO TABS
500.0000 mg | ORAL_TABLET | Freq: Once | ORAL | Status: AC
  Administered 2018-09-27: 500 mg via ORAL
  Filled 2018-09-27: qty 1

## 2018-09-27 MED ORDER — IPRATROPIUM-ALBUTEROL 0.5-2.5 (3) MG/3ML IN SOLN
3.0000 mL | Freq: Once | RESPIRATORY_TRACT | Status: AC
  Administered 2018-09-27: 3 mL via RESPIRATORY_TRACT
  Filled 2018-09-27: qty 3

## 2018-09-27 MED ORDER — ALBUTEROL SULFATE (2.5 MG/3ML) 0.083% IN NEBU
15.0000 mg | INHALATION_SOLUTION | Freq: Once | RESPIRATORY_TRACT | Status: AC
  Administered 2018-09-28: 15 mg via RESPIRATORY_TRACT
  Filled 2018-09-27 (×2): qty 18

## 2018-09-27 MED ORDER — SODIUM CHLORIDE 0.9 % IV BOLUS
1000.0000 mL | Freq: Once | INTRAVENOUS | Status: AC
  Administered 2018-09-27: 1000 mL via INTRAVENOUS

## 2018-09-27 MED ORDER — MAGNESIUM SULFATE 2 GM/50ML IV SOLN
2.0000 g | Freq: Once | INTRAVENOUS | Status: AC
  Administered 2018-09-27: 2 g via INTRAVENOUS
  Filled 2018-09-27: qty 50

## 2018-09-27 NOTE — ED Triage Notes (Signed)
Pt presents to ED via EMS from home with worsening SOB. Pt reports productive cough. Hx of COPD. Increased work of breathing noted. EMS report giving pt 2 duonebs and 1 albuterol tx in addition to 125mg  solumedrol. Pt reportedly much improved. Denies pain at this time.

## 2018-09-27 NOTE — ED Provider Notes (Signed)
[facility] Emergency Department Provider Note  ____________________________________________   First MD Initiated Contact with Patient 09/27/18 2259     (approximate)  I have reviewed the triage vital signs and the nursing notes.   HISTORY  Chief Complaint Shortness of Breath   HPI Samantha [name] is a 52 y.o. female who comes to the emergency department via EMS with shortness of breath.  She has a long-standing history of COPD for which she uses 2 L of oxygen at night.  Her most recent admission was 1 month ago for COPD exacerbation.  She reports roughly 3 days of increasing shortness of breath and increased work of breathing with increasingly productive cough.  Her symptoms are worse with exertion and minimally improved with rest.  She has no history of coronary artery disease or congestive heart failure.  She has been gaining weight unintentionally which she attributes to recent frequent steroid use.  She is able to lie completely flat.  No fevers or chills.  Her symptoms have been gradual onset slowly progressive are now moderate severity.  Was given 2 DuoNeb's and one albuterol treatment in route with improvement in her symptoms.   Past Medical History:  Diagnosis Date  . Acute on chronic respiratory failure with hypoxemia (HCC) 08/30/2015  . Asthma   . COPD (chronic obstructive pulmonary disease) (HCC)   . Diabetes mellitus without complication (HCC)   . Hypertension     Patient Active Problem List   Diagnosis Date Noted  . COPD exacerbation (HCC) 02/21/2018  . Acute on chronic respiratory failure (HCC) 10/06/2015  . Acute on chronic respiratory failure with hypoxemia (HCC) 08/30/2015    Past Surgical History:  Procedure Laterality Date  . none      Prior to Admission medications   Medication Sig Start Date End Date Taking? Authorizing Provider  albuterol (PROVENTIL HFA;VENTOLIN HFA) 108 (90 Base) MCG/ACT inhaler Inhale 2 puffs into the lungs  every 6 (six) hours as needed for wheezing or shortness of breath. 08/01/18   Shaune Pollack, MD  amLODipine (NORVASC) 10 MG tablet Take 1 tablet (10 mg total) by mouth daily. 08/01/18   Shaune Pollack, MD  azithromycin (ZITHROMAX Z-PAK) 250 MG tablet Take 2 tablets (500 mg) on  Day 1,  followed by 1 tablet (250 mg) once daily on Days 2 through 5. 09/28/18   Merrily Brittle, MD  budesonide-formoterol (SYMBICORT) 160-4.5 MCG/ACT inhaler Inhale 2 puffs 2 (two) times daily into the lungs. 10/30/17   Emily Filbert, MD  gabapentin (NEURONTIN) 300 MG capsule Take 300 mg by mouth 2 (two) times daily.    [provider]  glimepiride (AMARYL) 4 MG tablet Take 4 mg by mouth daily.    [provider]  guaiFENesin-dextromethorphan (ROBITUSSIN DM) 100-10 MG/5ML syrup Take 5 mLs by mouth every 4 (four) hours as needed for cough. 08/01/18   Shaune Pollack, MD  losartan-hydrochlorothiazide [facility]) 50-12.5 MG tablet Take 1 tablet by mouth daily. 02/22/16   [provider]  metFORMIN (GLUCOPHAGE) 850 MG tablet Take 1 tablet by mouth 2 (two) times daily with a meal.  12/11/15   [provider]  montelukast (SINGULAIR) 10 MG tablet Take 10 mg by mouth at bedtime.     [provider]  nicotine (NICODERM CQ - DOSED IN MG/24 HOURS) 14 mg/24hr patch Place 1 patch (14 mg total) onto the skin daily. 08/01/18   Shaune Pollack, MD  predniSONE (DELTASONE) 50 MG tablet Take 1 tablet (50 mg total)  by mouth daily for 4 days. 09/28/18 10/02/18  Merrily Brittle, MD    Allergies Percocet [oxycodone-acetaminophen]  Family History  Problem Relation Age of Onset  . Asthma Mother   . Diabetes Mother   . Hypertension Mother   . Asthma Sister   . Heart attack Father     Social History Social History   Tobacco Use  . Smoking status: Current Every Day Smoker    Packs/day: 0.10    Years: 23.00    Pack years: 2.30    Types: Cigarettes  . Smokeless tobacco: Never Used  Substance Use Topics  .  Alcohol use: Yes  . Drug use: Yes    Types: Cocaine    Comment: 2 weeks ago snorted it.      Review of Systems Constitutional: No fever/chills Eyes: No visual changes. ENT: No sore throat. Cardiovascular: Positive for chest pain. Respiratory: Positive for shortness of breath. Gastrointestinal: No abdominal pain.  No nausea, no vomiting.  No diarrhea.  No constipation. Genitourinary: Negative for dysuria. Musculoskeletal: Negative for back pain. Skin: Negative for rash. Neurological: Negative for headaches, focal weakness or numbness.   ____________________________________________   PHYSICAL EXAM:  VITAL SIGNS: ED Triage Vitals  Enc Vitals Group     BP 09/27/18 2235 (!) 146/87     Pulse Rate 09/27/18 2235 100     Resp 09/27/18 2235 (!) 22     Temp --      Temp Source 09/27/18 2235 Oral     SpO2 09/27/18 2230 92 %     Weight 09/27/18 2236 267 lb (121.1 kg)     Height 09/27/18 2236 5\' 7"  (1.702 m)     Head Circumference --      Peak Flow --      Pain Score 09/27/18 2236 0     Pain Loc --      Pain Edu? --      Excl. in GC? --     Constitutional: Alert and oriented x4 moderate respiratory distress using accessory muscles although able to speak in short sentences Eyes: PERRL EOMI. Head: Atraumatic. Nose: No congestion/rhinnorhea. Mouth/Throat: No trismus Neck: No stridor.   Cardiovascular: Tachycardic rate, regular rhythm. Grossly normal heart sounds.  Good peripheral circulation. Respiratory: Increased respiratory effort with decreased air entry bilaterally.  Prolonged expiratory phase bilaterally with expiratory wheeze throughout Gastrointestinal: Soft nontender Musculoskeletal: No lower extremity edema.  Legs are equal in size. Neurologic:  Normal speech and language. No gross focal neurologic deficits are appreciated. Skin:  Skin is warm, dry and intact. No rash noted. Psychiatric: Somewhat anxious appearing   ____________________________________________     DIFFERENTIAL includes but not limited to  COPD exacerbation, pneumonia, pneumothorax, lobar collapse, pulmonary embolism, congestive heart failure ____________________________________________   LABS (all labs ordered are listed, but only abnormal results are displayed)  Labs Reviewed  CBC WITH DIFFERENTIAL/PLATELET - Abnormal; Notable for the following components:      Result Value   WBC 11.7 (*)    Lymphs Abs 4.3 (*)    All other components within normal limits  COMPREHENSIVE METABOLIC PANEL - Abnormal; Notable for the following components:   Glucose, Bld 146 (*)    BUN 22 (*)    Creatinine, Ser 1.51 (*)    GFR calc non Af Amer 38 (*)    GFR calc Af Amer 44 (*)    All other components within normal limits  TROPONIN I    Lab work reviewed by me with slightly elevated white  count which is nonspecific.  Elevated creatinine is at baseline. __________________________________________  EKG  ED ECG REPORT I, Merrily Brittle, the attending physician, personally viewed and interpreted this ECG.  Date: 09/27/2018 EKG Time:  Rate: 100 Rhythm: normal sinus rhythm QRS Axis: normal Intervals: normal ST/T Wave abnormalities: Lateral ST depression with no reciprocal elevation Narrative Interpretation: Concerning for subendocardial ischemia  ____________________________________________  RADIOLOGY  Chest x-ray reviewed by me shows central bronchial thickening ____________________________________________   PROCEDURES  Procedure(s) performed: no  Procedures  Critical Care performed: no  ____________________________________________   INITIAL IMPRESSION / ASSESSMENT AND PLAN / ED COURSE  Pertinent labs & imaging results that were available during my care of the patient were reviewed by me and considered in my medical decision making (see chart for details).   As part of my medical decision making, I reviewed the following data within the electronic MEDICAL RECORD NUMBER History  obtained from family if available, nursing notes, old chart and ekg, as well as notes from prior ED visits.  The patient comes to the emergency department quite short of breath, wheezing, with prolonged expiratory phase which is all consistent with COPD exacerbation.  She already had 2 DuoNeb so we will give her her third along with 15 mg of albuterol, magnesium, azithromycin, and we will reevaluate.  Gentle IV fluids for her likely insensible losses.      __----------------------------------------- 1:00 AM on 09/28/2018 -----------------------------------------  The patient feels improved and was able to get up and ambulate down the hall without desaturation.  I offered her inpatient admission given her significant beta agonist requirement however she declined because she has to take care of her grandkids in the morning.  She is discharged home with a short course of prednisone as well as azithromycin.  __________________________________________   FINAL CLINICAL IMPRESSION(S) / ED DIAGNOSES  Final diagnoses:  COPD exacerbation (HCC)      NEW MEDICATIONS STARTED DURING THIS VISIT:  Discharge Medication List as of 09/28/2018  1:00 AM    START taking these medications   Details  azithromycin (ZITHROMAX Z-PAK) 250 MG tablet Take 2 tablets (500 mg) on  Day 1,  followed by 1 tablet (250 mg) once daily on Days 2 through 5., Print         Note:  This document was prepared using Dragon voice recognition software and may include unintentional dictation errors.     Merrily Brittle, MD 09/30/18 [phone]

## 2018-09-28 DIAGNOSIS — J441 Chronic obstructive pulmonary disease with (acute) exacerbation: Secondary | ICD-10-CM | POA: Diagnosis not present

## 2018-09-28 MED ORDER — AZITHROMYCIN 250 MG PO TABS: ORAL_TABLET | ORAL | 0 refills | Status: DC

## 2018-09-28 MED ORDER — PREDNISONE 50 MG PO TABS: 50.0000 mg | ORAL_TABLET | Freq: Every day | ORAL | 0 refills | Status: AC

## 2018-09-28 NOTE — Discharge Instructions (Signed)
Today I offered to admit you to the hospital however you declined saying you would rather go home and take care of your family which is entirely reasonable.  Please take all of your steroids and antibiotics as prescribed and follow-up with your primary care physician this coming Monday for recheck.  Return to the emergency department sooner for any new or worsening symptoms such as chest pain, worsening shortness of breath, or for any other issues whatsoever.  It was a pleasure to take care of you today, and thank you for coming to our emergency department.  If you have any questions or concerns before leaving please ask the nurse to grab me and I'm more than happy to go through your aftercare instructions again.  If you were prescribed any opioid pain medication today such as Norco, Vicodin, Percocet, morphine, hydrocodone, or oxycodone please make sure you do not drive when you are taking this medication as it can alter your ability to drive safely.  If you have any concerns once you are home that you are not improving or are in fact getting worse before you can make it to your follow-up appointment, please do not hesitate to call 911 and come back for further evaluation.  Merrily Brittle, MD  Results for orders placed or performed during the hospital encounter of 09/27/18  Troponin I  Result Value Ref Range   Troponin I <0.03 <0.03 ng/mL  CBC with Differential  Result Value Ref Range   WBC 11.7 (H) 4.0 - 10.5 K/uL   RBC 4.99 3.87 - 5.11 MIL/uL   Hemoglobin 14.6 12.0 - 15.0 g/dL   HCT 91.4 78.2 - 95.6 %   MCV 90.0 80.0 - 100.0 fL   MCH 29.3 26.0 - 34.0 pg   MCHC 32.5 30.0 - 36.0 g/dL   RDW 21.3 08.6 - 57.8 %   Platelets 377 150 - 400 K/uL   nRBC 0.0 0.0 - 0.2 %   Neutrophils Relative % 51 %   Neutro Abs 6.1 1.7 - 7.7 K/uL   Lymphocytes Relative 37 %   Lymphs Abs 4.3 (H) 0.7 - 4.0 K/uL   Monocytes Relative 7 %   Monocytes Absolute 0.8 0.1 - 1.0 K/uL   Eosinophils Relative 4 %   Eosinophils Absolute 0.4 0.0 - 0.5 K/uL   Basophils Relative 1 %   Basophils Absolute 0.1 0.0 - 0.1 K/uL   Immature Granulocytes 0 %   Abs Immature Granulocytes 0.05 0.00 - 0.07 K/uL  Comprehensive metabolic panel  Result Value Ref Range   Sodium 141 135 - 145 mmol/L   Potassium 3.8 3.5 - 5.1 mmol/L   Chloride 105 98 - 111 mmol/L   CO2 28 22 - 32 mmol/L   Glucose, Bld 146 (H) 70 - 99 mg/dL   BUN 22 (H) 6 - 20 mg/dL   Creatinine, Ser 4.69 (H) 0.44 - 1.00 mg/dL   Calcium 9.3 8.9 - 62.9 mg/dL   Total Protein 6.9 6.5 - 8.1 g/dL   Albumin 4.0 3.5 - 5.0 g/dL   AST 26 15 - 41 U/L   ALT 43 0 - 44 U/L   Alkaline Phosphatase 76 38 - 126 U/L   Total Bilirubin 0.7 0.3 - 1.2 mg/dL   GFR calc non Af Amer 38 (L) >60 mL/min   GFR calc Af Amer 44 (L) >60 mL/min   Anion gap 8 5 - 15   Dg Chest 2 View  Result Date: 09/27/2018 CLINICAL DATA:  Shortness of breath.  Productive cough. EXAM: CHEST - 2 VIEW COMPARISON:  Multiple prior radiographs most recently 08/12/2018. FINDINGS: The cardiomediastinal contours are normal. Mild central bronchial thickening. Pulmonary vasculature is normal. No consolidation, pleural effusion, or pneumothorax. No acute osseous abnormalities are seen. IMPRESSION: Mild central bronchial thickening.  No focal airspace disease. Electronically Signed   By: Narda Rutherford M.D.   On: 09/27/2018 23:11

## 2018-10-04 ENCOUNTER — Encounter: Payer: Self-pay | Admitting: Emergency Medicine

## 2018-10-04 ENCOUNTER — Emergency Department
Admission: EM | Admit: 2018-10-04 | Discharge: 2018-10-04 | Disposition: A | Discharge status: Home / Self Care | Payer: Medicare HMO | Attending: Emergency Medicine | Admitting: Emergency Medicine

## 2018-10-04 ENCOUNTER — Emergency Department: Payer: Medicare HMO

## 2018-10-04 DIAGNOSIS — R05 Cough: Secondary | ICD-10-CM | POA: Diagnosis not present

## 2018-10-04 DIAGNOSIS — J441 Chronic obstructive pulmonary disease with (acute) exacerbation: Secondary | ICD-10-CM | POA: Diagnosis not present

## 2018-10-04 DIAGNOSIS — R0602 Shortness of breath: Secondary | ICD-10-CM | POA: Diagnosis not present

## 2018-10-04 DIAGNOSIS — R0603 Acute respiratory distress: Secondary | ICD-10-CM | POA: Diagnosis present

## 2018-10-04 DIAGNOSIS — F17210 Nicotine dependence, cigarettes, uncomplicated: Secondary | ICD-10-CM | POA: Insufficient documentation

## 2018-10-04 DIAGNOSIS — Z79899 Other long term (current) drug therapy: Secondary | ICD-10-CM | POA: Diagnosis not present

## 2018-10-04 DIAGNOSIS — E119 Type 2 diabetes mellitus without complications: Secondary | ICD-10-CM | POA: Insufficient documentation

## 2018-10-04 DIAGNOSIS — I10 Essential (primary) hypertension: Secondary | ICD-10-CM | POA: Insufficient documentation

## 2018-10-04 DIAGNOSIS — Z7984 Long term (current) use of oral hypoglycemic drugs: Secondary | ICD-10-CM | POA: Diagnosis not present

## 2018-10-04 LAB — BASIC METABOLIC PANEL
ANION GAP: 9 (ref 5–15)
BUN: 23 mg/dL — AB (ref 6–20)
CHLORIDE: 107 mmol/L (ref 98–111)
CO2: 24 mmol/L (ref 22–32)
Calcium: 9.2 mg/dL (ref 8.9–10.3)
Creatinine, Ser: 1.39 mg/dL — ABNORMAL HIGH (ref 0.44–1.00)
GFR calc Af Amer: 49 mL/min — ABNORMAL LOW (ref >60)
GFR, EST NON AFRICAN AMERICAN: 42 mL/min — AB (ref >60)
Glucose, Bld: 170 mg/dL — ABNORMAL HIGH (ref 70–99)
POTASSIUM: 4 mmol/L (ref 3.5–5.1)
SODIUM: 140 mmol/L (ref 135–145)

## 2018-10-04 LAB — CBC
HEMATOCRIT: 45.8 % (ref 36.0–46.0)
HEMOGLOBIN: 14.7 g/dL (ref 12.0–15.0)
MCH: 29.1 pg (ref 26.0–34.0)
MCHC: 32.1 g/dL (ref 30.0–36.0)
MCV: 90.5 fL (ref 80.0–100.0)
NRBC: 0 % (ref 0.0–0.2)
Platelets: 383 10*3/uL (ref 150–400)
RBC: 5.06 MIL/uL (ref 3.87–5.11)
RDW: 13.9 % (ref 11.5–15.5)
WBC: 10.7 10*3/uL — ABNORMAL HIGH (ref 4.0–10.5)

## 2018-10-04 LAB — TROPONIN I

## 2018-10-04 MED ORDER — IPRATROPIUM-ALBUTEROL 0.5-2.5 (3) MG/3ML IN SOLN
3.0000 mL | Freq: Once | RESPIRATORY_TRACT | Status: AC
  Administered 2018-10-04: 3 mL via RESPIRATORY_TRACT
  Filled 2018-10-04: qty 3

## 2018-10-04 MED ORDER — METHYLPREDNISOLONE SODIUM SUCC 125 MG IJ SOLR
125.0000 mg | Freq: Once | INTRAMUSCULAR | Status: AC
  Administered 2018-10-04: 125 mg via INTRAVENOUS
  Filled 2018-10-04: qty 2

## 2018-10-04 MED ORDER — BUDESONIDE-FORMOTEROL FUMARATE 160-4.5 MCG/ACT IN AERO: 2.0000 | INHALATION_SPRAY | Freq: Two times a day (BID) | RESPIRATORY_TRACT | 0 refills | Status: DC

## 2018-10-04 MED ORDER — PREDNISONE 10 MG PO TABS: ORAL_TABLET | ORAL | 0 refills | Status: DC

## 2018-10-04 NOTE — Discharge Instructions (Addendum)
Return to the emergency department immediately for any worsening condition including trouble breathing, shortness breath, chest pain, fevers, confusion altered mental status, dizziness, weakness, confusion or passing out, or any other symptoms concerning to you.

## 2018-10-04 NOTE — ED Triage Notes (Signed)
Pt arrived via EMS from home with increased SOB x2 days. Pt wheezing on arrival, able to speak in complete sentences, O2 97% RA. Pt received 1 duo neb, 2 albuterol and 2mg  of mag in route.

## 2018-10-04 NOTE — ED Provider Notes (Signed)
[facility] Emergency Department Provider Note ____________________________________________   I have reviewed the triage vital signs and the triage nursing note.  HISTORY  Chief Complaint Respiratory Distress   Historian Patient  HPI Samantha [name] is a 52 y.o. female with a history of asthma/COPD, not on home oxygen, reports last hospitalization for this was about 2 months ago, and last course of prednisone was last week, she states 4 days of 40 mg.  Reports overnight acute onset wheezing and shortness of breath consistent with prior episodes of COPD exacerbation.  No recent fevers, nausea, vomiting, chest pain, or sputum production.  Trouble breathing was moderate to severe, received DuoNeb and mag sulfate by EMS and was doing much better upon arrival to the ED.  She states she often needs a longer taper of prednisone.     Past Medical History:  Diagnosis Date  . Acute on chronic respiratory failure with hypoxemia (HCC) 08/30/2015  . Asthma   . COPD (chronic obstructive pulmonary disease) (HCC)   . Diabetes mellitus without complication (HCC)   . Hypertension     Patient Active Problem List   Diagnosis Date Noted  . COPD exacerbation (HCC) 02/21/2018  . Acute on chronic respiratory failure (HCC) 10/06/2015  . Acute on chronic respiratory failure with hypoxemia (HCC) 08/30/2015    Past Surgical History:  Procedure Laterality Date  . none      Prior to Admission medications   Medication Sig Start Date End Date Taking? Authorizing Provider  albuterol (PROVENTIL HFA;VENTOLIN HFA) 108 (90 Base) MCG/ACT inhaler Inhale 2 puffs into the lungs every 6 (six) hours as needed for wheezing or shortness of breath. 08/01/18   Shaune Pollack, MD  amLODipine (NORVASC) 10 MG tablet Take 1 tablet (10 mg total) by mouth daily. 08/01/18   Shaune Pollack, MD  azithromycin (ZITHROMAX Z-PAK) 250 MG tablet Take 2 tablets (500 mg) on  Day 1,  followed by 1 tablet (250 mg)  once daily on Days 2 through 5. 09/28/18   Merrily Brittle, MD  budesonide-formoterol (SYMBICORT) 160-4.5 MCG/ACT inhaler Inhale 2 puffs into the lungs 2 (two) times daily. 10/04/18   Governor Rooks, MD  gabapentin (NEURONTIN) 300 MG capsule Take 300 mg by mouth 2 (two) times daily.    [provider]  glimepiride (AMARYL) 4 MG tablet Take 4 mg by mouth daily.    [provider]  guaiFENesin-dextromethorphan (ROBITUSSIN DM) 100-10 MG/5ML syrup Take 5 mLs by mouth every 4 (four) hours as needed for cough. 08/01/18   Shaune Pollack, MD  losartan-hydrochlorothiazide [facility]) 50-12.5 MG tablet Take 1 tablet by mouth daily. 02/22/16   [provider]  metFORMIN (GLUCOPHAGE) 850 MG tablet Take 1 tablet by mouth 2 (two) times daily with a meal.  12/11/15   [provider]  montelukast (SINGULAIR) 10 MG tablet Take 10 mg by mouth at bedtime.     [provider]  nicotine (NICODERM CQ - DOSED IN MG/24 HOURS) 14 mg/24hr patch Place 1 patch (14 mg total) onto the skin daily. 08/01/18   Shaune Pollack, MD  predniSONE (DELTASONE) 10 MG tablet 60mg  by mouth for 3 days 40mg  by mouth for 2 days 30mg  by mouth for 2 days 20mg  by mouth for 2 days 10mg  by mouth for 2 days 10/04/18   Governor Rooks, MD    Allergies  Allergen Reactions  . Percocet [Oxycodone-Acetaminophen] Itching    Family History  Problem Relation Age of Onset  . Asthma Mother   .  Diabetes Mother   . Hypertension Mother   . Asthma Sister   . Heart attack Father     Social History Social History   Tobacco Use  . Smoking status: Current Every Day Smoker    Packs/day: 0.10    Years: 23.00    Pack years: 2.30    Types: Cigarettes  . Smokeless tobacco: Never Used  Substance Use Topics  . Alcohol use: Yes  . Drug use: Yes    Types: Cocaine    Comment: 2 weeks ago snorted it.      Review of Systems  Constitutional: Negative for fever. Eyes: Negative for visual changes. ENT: Negative for sore  throat. Cardiovascular: Negative for chest pain. Respiratory: Positive as per HPI for shortness of breath. Gastrointestinal: Negative for abdominal pain, vomiting and diarrhea. Genitourinary: Negative for dysuria. Musculoskeletal: Negative for back pain. Skin: Negative for rash. Neurological: Negative for headache.  ____________________________________________   PHYSICAL EXAM:  VITAL SIGNS: ED Triage Vitals [10/04/18 0656]  Enc Vitals Group     BP (!) 161/99     Pulse Rate 96     Resp (!) 26     Temp 98 F (36.7 C)     Temp Source Oral     SpO2 97 %     Weight      Height      Head Circumference      Peak Flow      Pain Score      Pain Loc      Pain Edu?      Excl. in GC?      Constitutional: Alert and oriented.  HEENT      Head: Normocephalic and atraumatic.      Eyes: Conjunctivae are normal. Pupils equal and round.       Ears:         Nose: No congestion/rhinnorhea.      Mouth/Throat: Mucous membranes are moist.      Neck: No stridor. Cardiovascular/Chest: Normal rate, regular rhythm.  No murmurs, rubs, or gallops. Respiratory: Normal respiratory effort without tachypnea nor retractions.  Mild tight breath sounds.  Able to speak full sentences.  No rhonchi or rales. Gastrointestinal: Soft. No distention, no guarding, no rebound. Nontender.    Genitourinary/rectal:Deferred Musculoskeletal: Nontender with normal range of motion in all extremities. No joint effusions.  No lower extremity tenderness.  No edema. Neurologic:  Normal speech and language. No gross or focal neurologic deficits are appreciated. Skin:  Skin is warm, dry and intact. No rash noted. Psychiatric: Mood and affect are normal. Speech and behavior are normal. Patient exhibits appropriate insight and judgment.   ____________________________________________  LABS (pertinent positives/negatives) I, Governor Rooks, MD the attending physician have reviewed the labs noted below.  Labs Reviewed   BASIC METABOLIC PANEL - Abnormal; Notable for the following components:      Result Value   Glucose, Bld 170 (*)    BUN 23 (*)    Creatinine, Ser 1.39 (*)    GFR calc non Af Amer 42 (*)    GFR calc Af Amer 49 (*)    All other components within normal limits  CBC - Abnormal; Notable for the following components:   WBC 10.7 (*)    All other components within normal limits  TROPONIN I    ____________________________________________    EKG I, Governor Rooks, MD, the attending physician have personally viewed and interpreted all ECGs.  97 bpm.  Normal sinus rhythm.  Narrow QS per  normal axis.  Nonspecific ST-T wave with some wavy underlying baseline. ____________________________________________  RADIOLOGY   Chest x-ray: No infiltrate or pulmonary edema. __________________________________________  PROCEDURES  Procedure(s) performed: None  Procedures  Critical Care performed: None   ____________________________________________  ED COURSE / ASSESSMENT AND PLAN  Pertinent labs & imaging results that were available during my care of the patient were reviewed by me and considered in my medical decision making (see chart for details).     Looking much better upon arrival then apparently when EMS had been called.  No concern for pneumonia.  No concern for cardiac etiology or complication such as CHF or ACS.  This seems consistent with COPD exacerbation.  Patient really requested to go home.  States that a longer prednisone taper has worked in the past, we will go ahead and do this.  She is requesting Symbicort refill since she ran out of that.  I will do that.      CONSULTATIONS:   None  Patient / Family / Caregiver informed of clinical course, medical decision-making process, and agree with plan.   I discussed return precautions, follow-up instructions, and discharge instructions with patient and/or family.  Discharge Instructions : Return to the emergency department  immediately for any worsening condition including trouble breathing, shortness breath, chest pain, fevers, confusion altered mental status, dizziness, weakness, confusion or passing out, or any other symptoms concerning to you.    ___________________________________________   FINAL CLINICAL IMPRESSION(S) / ED DIAGNOSES   Final diagnoses:  COPD exacerbation (HCC)      ___________________________________________         Note: This dictation was prepared with Dragon dictation. Any transcriptional errors that result from this process are unintentional    Governor Rooks, MD 10/04/18 [phone]

## 2018-10-12 DIAGNOSIS — R0609 Other forms of dyspnea: Secondary | ICD-10-CM | POA: Diagnosis not present

## 2018-10-12 DIAGNOSIS — J449 Chronic obstructive pulmonary disease, unspecified: Secondary | ICD-10-CM | POA: Diagnosis not present

## 2018-10-13 DIAGNOSIS — J45998 Other asthma: Secondary | ICD-10-CM | POA: Diagnosis not present

## 2018-10-21 IMAGING — CR DG CHEST 2V
1 series · 2 of 2 positions shown · non-contrast
Comparison: 02/27/2017.

CLINICAL DATA: Shortness of breath for 2 days.

EXAM:
CHEST  2 VIEW

[Series 1: dg chest 2 view · 0.14mm/px · 2 of 2 slices shown]
[im 1/2]
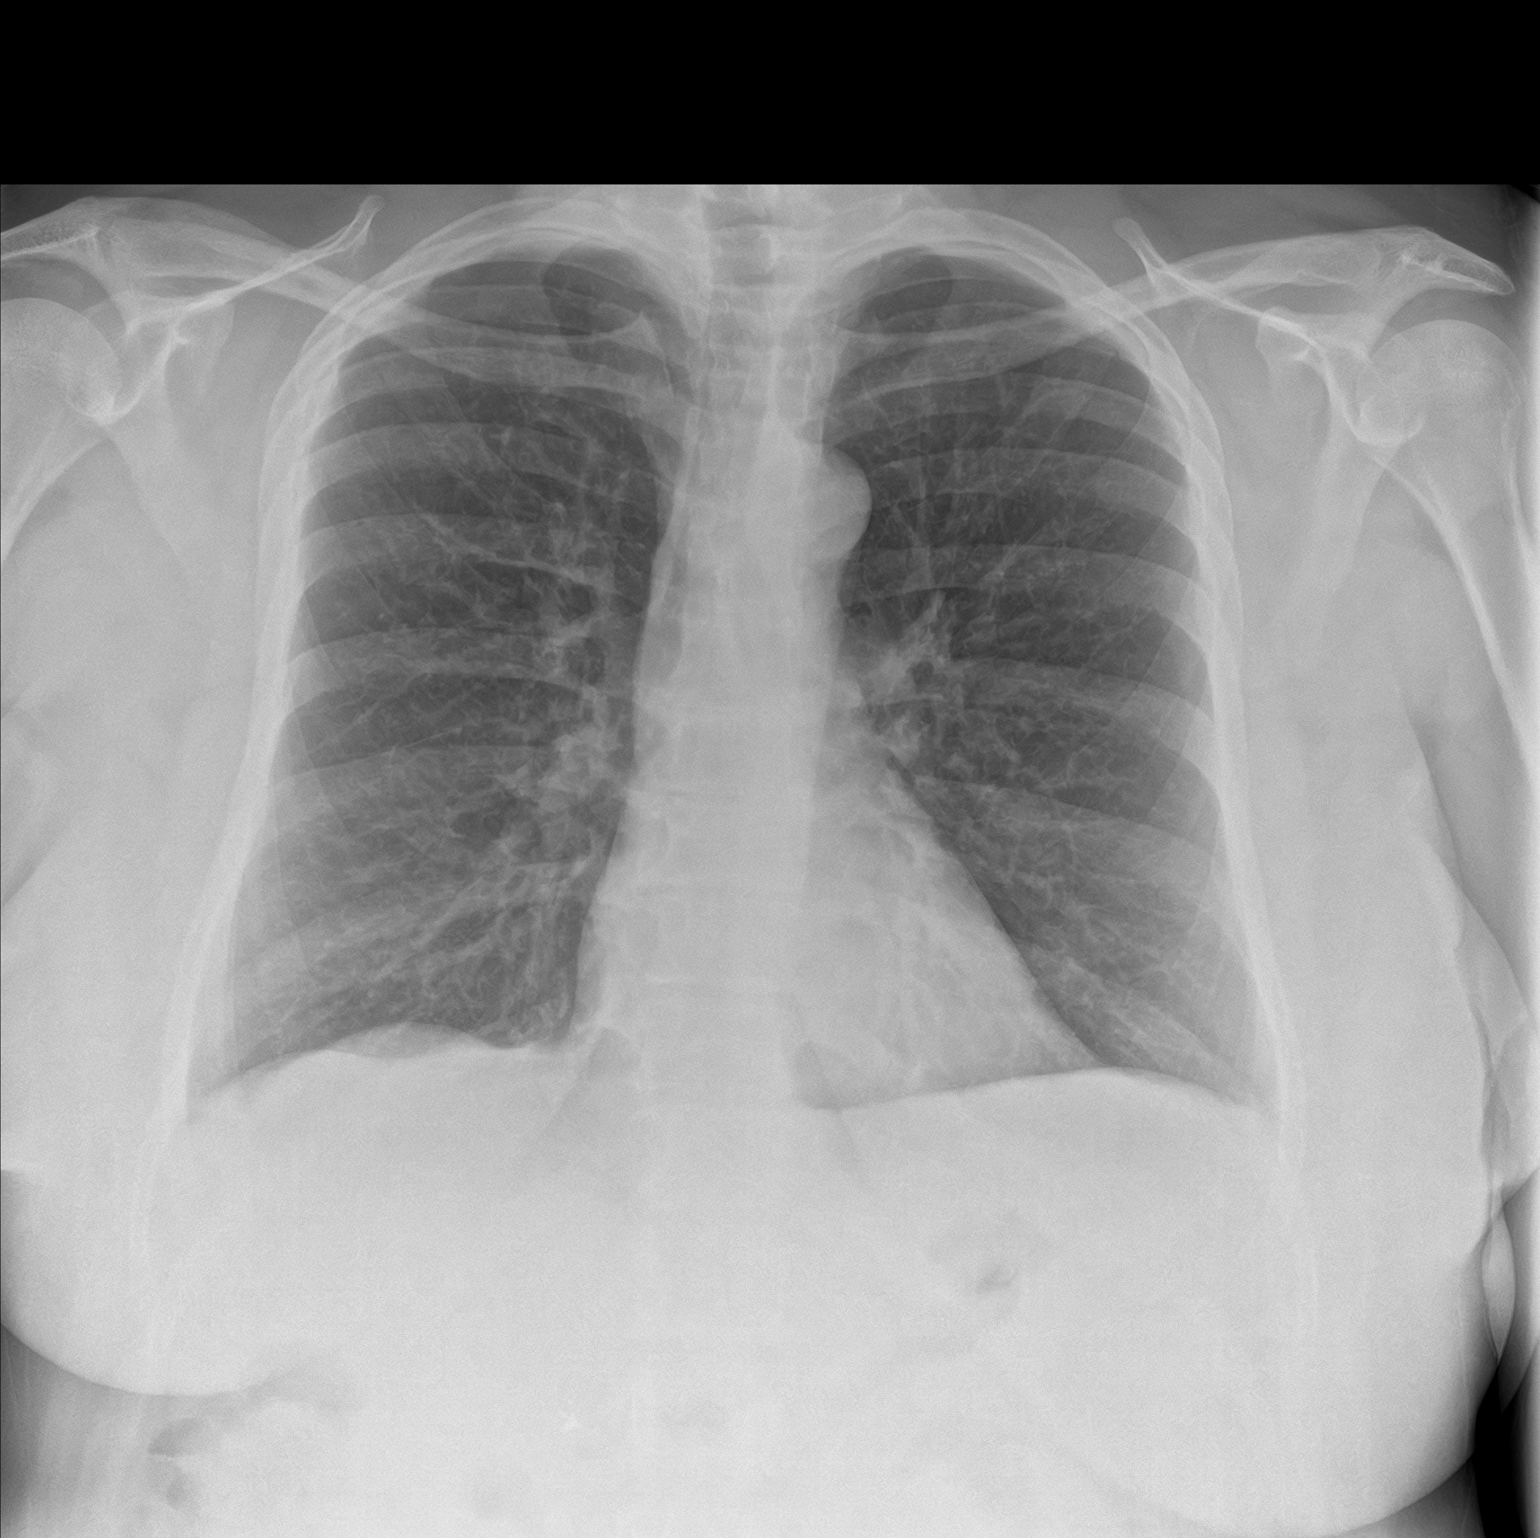
[im 2/2]
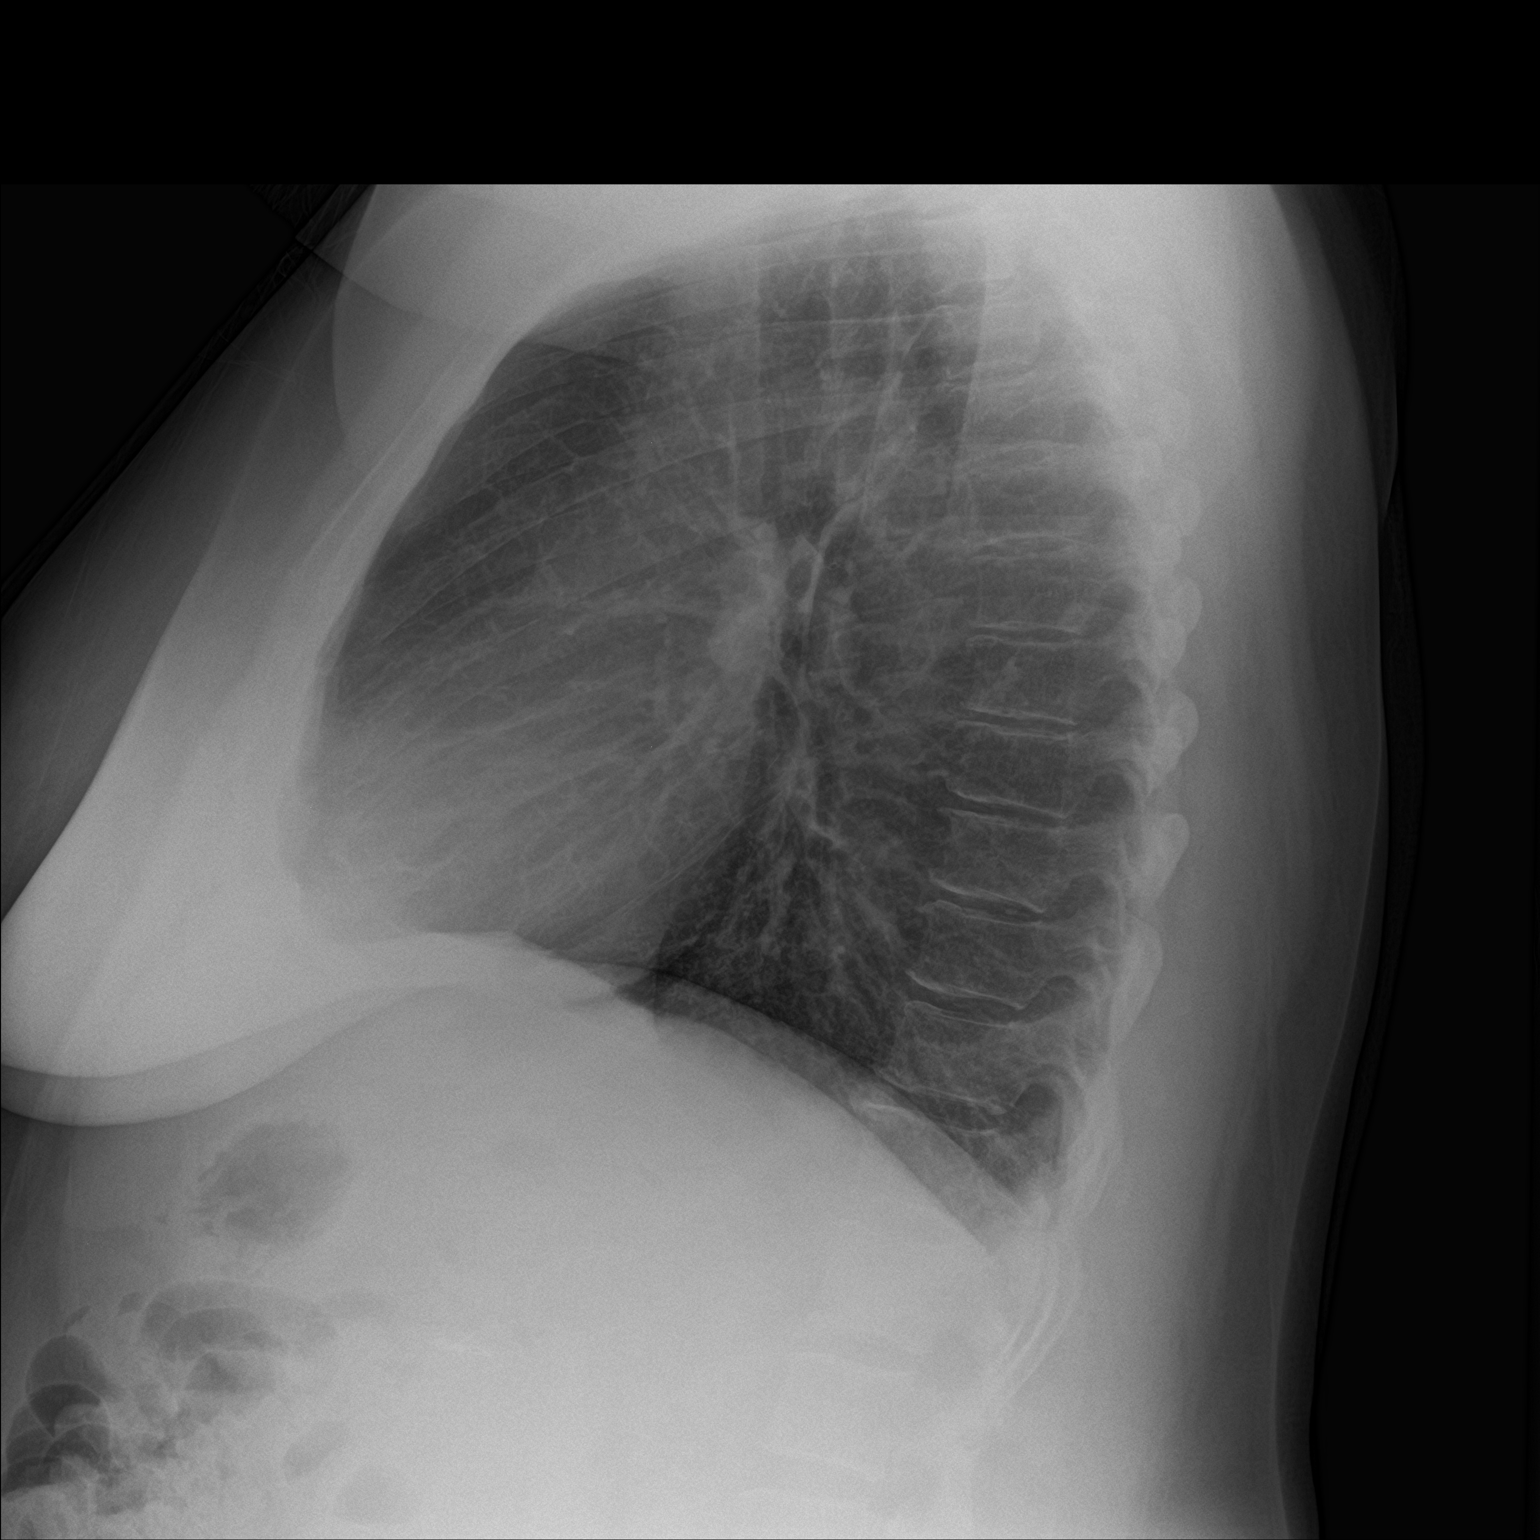

[2 of 2 positions shown; findings below may reference images not displayed]

FINDINGS: Trachea is midline. Heart size normal. Lungs are hyperinflated but
clear. No pleural fluid.
IMPRESSION: Hyperinflation without acute finding.

## 2018-11-12 DIAGNOSIS — J45998 Other asthma: Secondary | ICD-10-CM | POA: Diagnosis not present

## 2018-11-25 ENCOUNTER — Inpatient Hospital Stay: Payer: Medicare HMO

## 2018-11-25 ENCOUNTER — Emergency Department: Payer: Medicare HMO

## 2018-11-25 ENCOUNTER — Inpatient Hospital Stay
Admission: EM | Admit: 2018-11-25 | Discharge: 2018-12-04 | DRG: 207 | Disposition: A | Discharge status: Home Health | Payer: Medicare HMO | Attending: Internal Medicine | Admitting: Internal Medicine

## 2018-11-25 DIAGNOSIS — R579 Shock, unspecified: Secondary | ICD-10-CM | POA: Diagnosis not present

## 2018-11-25 DIAGNOSIS — R069 Unspecified abnormalities of breathing: Secondary | ICD-10-CM | POA: Diagnosis not present

## 2018-11-25 DIAGNOSIS — J9622 Acute and chronic respiratory failure with hypercapnia: Secondary | ICD-10-CM | POA: Diagnosis present

## 2018-11-25 DIAGNOSIS — Z4682 Encounter for fitting and adjustment of non-vascular catheter: Secondary | ICD-10-CM | POA: Diagnosis not present

## 2018-11-25 DIAGNOSIS — J9601 Acute respiratory failure with hypoxia: Secondary | ICD-10-CM | POA: Diagnosis not present

## 2018-11-25 DIAGNOSIS — J9690 Respiratory failure, unspecified, unspecified whether with hypoxia or hypercapnia: Secondary | ICD-10-CM

## 2018-11-25 DIAGNOSIS — Z825 Family history of asthma and other chronic lower respiratory diseases: Secondary | ICD-10-CM

## 2018-11-25 DIAGNOSIS — F17210 Nicotine dependence, cigarettes, uncomplicated: Secondary | ICD-10-CM | POA: Diagnosis not present

## 2018-11-25 DIAGNOSIS — J441 Chronic obstructive pulmonary disease with (acute) exacerbation: Principal | ICD-10-CM | POA: Diagnosis present

## 2018-11-25 DIAGNOSIS — J9621 Acute and chronic respiratory failure with hypoxia: Secondary | ICD-10-CM | POA: Diagnosis not present

## 2018-11-25 DIAGNOSIS — J9811 Atelectasis: Secondary | ICD-10-CM | POA: Diagnosis not present

## 2018-11-25 DIAGNOSIS — G629 Polyneuropathy, unspecified: Secondary | ICD-10-CM | POA: Diagnosis present

## 2018-11-25 DIAGNOSIS — Z885 Allergy status to narcotic agent status: Secondary | ICD-10-CM | POA: Diagnosis not present

## 2018-11-25 DIAGNOSIS — Z7984 Long term (current) use of oral hypoglycemic drugs: Secondary | ICD-10-CM | POA: Diagnosis not present

## 2018-11-25 DIAGNOSIS — Z79899 Other long term (current) drug therapy: Secondary | ICD-10-CM | POA: Diagnosis not present

## 2018-11-25 DIAGNOSIS — Z8249 Family history of ischemic heart disease and other diseases of the circulatory system: Secondary | ICD-10-CM

## 2018-11-25 DIAGNOSIS — I10 Essential (primary) hypertension: Secondary | ICD-10-CM | POA: Diagnosis not present

## 2018-11-25 DIAGNOSIS — R0602 Shortness of breath: Secondary | ICD-10-CM | POA: Diagnosis not present

## 2018-11-25 DIAGNOSIS — E119 Type 2 diabetes mellitus without complications: Secondary | ICD-10-CM | POA: Diagnosis present

## 2018-11-25 DIAGNOSIS — J9600 Acute respiratory failure, unspecified whether with hypoxia or hypercapnia: Secondary | ICD-10-CM | POA: Diagnosis not present

## 2018-11-25 DIAGNOSIS — R0603 Acute respiratory distress: Secondary | ICD-10-CM

## 2018-11-25 DIAGNOSIS — Z9981 Dependence on supplemental oxygen: Secondary | ICD-10-CM

## 2018-11-25 DIAGNOSIS — N170 Acute kidney failure with tubular necrosis: Secondary | ICD-10-CM | POA: Diagnosis not present

## 2018-11-25 DIAGNOSIS — Z833 Family history of diabetes mellitus: Secondary | ICD-10-CM

## 2018-11-25 DIAGNOSIS — R062 Wheezing: Secondary | ICD-10-CM | POA: Diagnosis not present

## 2018-11-25 DIAGNOSIS — J9610 Chronic respiratory failure, unspecified whether with hypoxia or hypercapnia: Secondary | ICD-10-CM | POA: Diagnosis not present

## 2018-11-25 DIAGNOSIS — Z7951 Long term (current) use of inhaled steroids: Secondary | ICD-10-CM | POA: Diagnosis not present

## 2018-11-25 DIAGNOSIS — Z6841 Body Mass Index (BMI) 40.0 and over, adult: Secondary | ICD-10-CM

## 2018-11-25 LAB — BLOOD GAS, ARTERIAL
Acid-base deficit: 6.2 mmol/L — ABNORMAL HIGH (ref 0.0–2.0)
Acid-base deficit: 6.1 mmol/L — ABNORMAL HIGH (ref 0.0–2.0)
Acid-base deficit: 5.1 mmol/L — ABNORMAL HIGH (ref 0.0–2.0)
BICARBONATE: 22.1 mmol/L (ref 20.0–28.0)
Bicarbonate: 23.1 mmol/L (ref 20.0–28.0)
Bicarbonate: 22 mmol/L (ref 20.0–28.0)
FIO2: 0.4
FIO2: 0.4
FIO2: 0.3
MECHVT: 450 mL
MECHVT: 500 mL
MECHVT: 500 mL
Mechanical Rate: 18
O2 Saturation: 89.1 %
O2 Saturation: 98.8 %
O2 Saturation: 96.2 %
PEEP/CPAP: 5 cmH2O
PEEP: 5 cmH2O
PEEP: 5 cmH2O
Patient temperature: 37
Patient temperature: 37
Patient temperature: 37
RATE: 18 resp/min
RATE: 16 resp/min
pCO2 arterial: 62 mmHg — ABNORMAL HIGH (ref 32.0–48.0)
pCO2 arterial: 54 mmHg — ABNORMAL HIGH (ref 32.0–48.0)
pCO2 arterial: 48 mmHg (ref 32.0–48.0)
pH, Arterial: 7.18 — CL (ref 7.350–7.450)
pH, Arterial: 7.22 — ABNORMAL LOW (ref 7.350–7.450)
pH, Arterial: 7.27 — ABNORMAL LOW (ref 7.350–7.450)
pO2, Arterial: 71 mmHg — ABNORMAL LOW (ref 83.0–108.0)
pO2, Arterial: 143 mmHg — ABNORMAL HIGH (ref 83.0–108.0)
pO2, Arterial: 94 mmHg (ref 83.0–108.0)

## 2018-11-25 LAB — BLOOD GAS, VENOUS
Acid-Base Excess: 2.2 mmol/L — ABNORMAL HIGH (ref 0.0–2.0)
BICARBONATE: 27.8 mmol/L (ref 20.0–28.0)
O2 SAT: 93.6 %
PATIENT TEMPERATURE: 37
pCO2, Ven: 46 mmHg (ref 44.0–60.0)
pH, Ven: 7.39 (ref 7.250–7.430)
pO2, Ven: 70 mmHg — ABNORMAL HIGH (ref 32.0–45.0)

## 2018-11-25 LAB — CBC WITH DIFFERENTIAL/PLATELET
Abs Immature Granulocytes: 0.04 10*3/uL (ref 0.00–0.07)
Basophils Absolute: 0 10*3/uL (ref 0.0–0.1)
Basophils Relative: 0 %
Eosinophils Absolute: 0.3 10*3/uL (ref 0.0–0.5)
Eosinophils Relative: 3 %
HCT: 43.9 % (ref 36.0–46.0)
Hemoglobin: 14.3 g/dL (ref 12.0–15.0)
Immature Granulocytes: 0 %
Lymphocytes Relative: 45 %
Lymphs Abs: 4.5 10*3/uL — ABNORMAL HIGH (ref 0.7–4.0)
MCH: 29.2 pg (ref 26.0–34.0)
MCHC: 32.6 g/dL (ref 30.0–36.0)
MCV: 89.8 fL (ref 80.0–100.0)
Monocytes Absolute: 0.7 10*3/uL (ref 0.1–1.0)
Monocytes Relative: 7 %
Neutro Abs: 4.4 10*3/uL (ref 1.7–7.7)
Neutrophils Relative %: 45 %
Platelets: 353 10*3/uL (ref 150–400)
RBC: 4.89 MIL/uL (ref 3.87–5.11)
RDW: 13.6 % (ref 11.5–15.5)
WBC: 10 10*3/uL (ref 4.0–10.5)
nRBC: 0 % (ref 0.0–0.2)

## 2018-11-25 LAB — COMPREHENSIVE METABOLIC PANEL
ALBUMIN: 3.9 g/dL (ref 3.5–5.0)
ALK PHOS: 80 U/L (ref 38–126)
ALT: 43 U/L (ref 0–44)
ANION GAP: 7 (ref 5–15)
AST: 26 U/L (ref 15–41)
BILIRUBIN TOTAL: 0.4 mg/dL (ref 0.3–1.2)
BUN: 27 mg/dL — ABNORMAL HIGH (ref 6–20)
CALCIUM: 9.1 mg/dL (ref 8.9–10.3)
CO2: 24 mmol/L (ref 22–32)
Chloride: 108 mmol/L (ref 98–111)
Creatinine, Ser: 1.49 mg/dL — ABNORMAL HIGH (ref 0.44–1.00)
GFR, EST AFRICAN AMERICAN: 46 mL/min — AB (ref >60)
GFR, EST NON AFRICAN AMERICAN: 40 mL/min — AB (ref >60)
Glucose, Bld: 149 mg/dL — ABNORMAL HIGH (ref 70–99)
POTASSIUM: 4.3 mmol/L (ref 3.5–5.1)
Sodium: 139 mmol/L (ref 135–145)
TOTAL PROTEIN: 7.2 g/dL (ref 6.5–8.1)

## 2018-11-25 LAB — TSH: TSH: 1.497 u[IU]/mL (ref 0.350–4.500)

## 2018-11-25 LAB — INFLUENZA PANEL BY PCR (TYPE A & B)
INFLAPCR: NEGATIVE
INFLBPCR: NEGATIVE

## 2018-11-25 LAB — TROPONIN I: Troponin I: <0.03 ng/mL (ref <0.03)

## 2018-11-25 LAB — BRAIN NATRIURETIC PEPTIDE: B NATRIURETIC PEPTIDE 5: 19 pg/mL (ref 0.0–100.0)

## 2018-11-25 LAB — MRSA PCR SCREENING: MRSA by PCR: NEGATIVE

## 2018-11-25 LAB — GLUCOSE, CAPILLARY: Glucose-Capillary: 240 mg/dL — ABNORMAL HIGH (ref 70–99)

## 2018-11-25 MED ORDER — PROPOFOL 10 MG/ML IV BOLUS
80.0000 mg | Freq: Once | INTRAVENOUS | Status: AC
  Administered 2018-11-25: 80 mg via INTRAVENOUS

## 2018-11-25 MED ORDER — MIDAZOLAM BOLUS VIA INFUSION
4.0000 mg | Freq: Once | INTRAVENOUS | Status: AC
  Administered 2018-11-25: 4 mg via INTRAVENOUS
  Filled 2018-11-25: qty 4

## 2018-11-25 MED ORDER — FLUTICASONE FUROATE-VILANTEROL 200-25 MCG/INH IN AEPB
1.0000 | INHALATION_SPRAY | Freq: Every day | RESPIRATORY_TRACT | Status: DC
  Filled 2018-11-25: qty 28

## 2018-11-25 MED ORDER — CHLORHEXIDINE GLUCONATE 0.12% ORAL RINSE (MEDLINE KIT)
15.0000 mL | Freq: Two times a day (BID) | OROMUCOSAL | Status: DC
  Administered 2018-11-25 – 2018-12-01 (×12): 15 mL via OROMUCOSAL

## 2018-11-25 MED ORDER — ALBUTEROL SULFATE (2.5 MG/3ML) 0.083% IN NEBU
5.0000 mg | INHALATION_SOLUTION | Freq: Once | RESPIRATORY_TRACT | Status: AC
  Administered 2018-11-25: 5 mg via RESPIRATORY_TRACT

## 2018-11-25 MED ORDER — SODIUM CHLORIDE 0.9 % IV SOLN
500.0000 mg | INTRAVENOUS | Status: AC
  Administered 2018-11-26 – 2018-11-29 (×4): 500 mg via INTRAVENOUS
  Filled 2018-11-25 (×4): qty 500

## 2018-11-25 MED ORDER — IPRATROPIUM BROMIDE 0.02 % IN SOLN
0.5000 mg | RESPIRATORY_TRACT | Status: DC
  Administered 2018-11-25 – 2018-11-26 (×6): 0.5 mg via RESPIRATORY_TRACT
  Filled 2018-11-25 (×6): qty 2.5

## 2018-11-25 MED ORDER — HYDROCHLOROTHIAZIDE 12.5 MG PO CAPS
12.5000 mg | ORAL_CAPSULE | Freq: Every day | ORAL | Status: DC
  Filled 2018-11-25 (×2): qty 1

## 2018-11-25 MED ORDER — PROPOFOL 10 MG/ML IV BOLUS
75.0000 mg | Freq: Once | INTRAVENOUS | Status: AC
  Administered 2018-11-25: 75 mg via INTRAVENOUS

## 2018-11-25 MED ORDER — FENTANYL CITRATE (PF) 100 MCG/2ML IJ SOLN
200.0000 ug | Freq: Once | INTRAMUSCULAR | Status: AC
  Administered 2018-11-25: 200 ug via INTRAVENOUS
  Filled 2018-11-25: qty 4

## 2018-11-25 MED ORDER — ONDANSETRON HCL 4 MG/2ML IJ SOLN: 4.0000 mg | Freq: Four times a day (QID) | INTRAMUSCULAR | Status: DC | PRN

## 2018-11-25 MED ORDER — SODIUM CHLORIDE 0.9 % IV SOLN
500.0000 mg | Freq: Once | INTRAVENOUS | Status: AC
  Administered 2018-11-25: 500 mg via INTRAVENOUS
  Filled 2018-11-25: qty 500

## 2018-11-25 MED ORDER — MIDAZOLAM HCL 2 MG/2ML IJ SOLN
4.0000 mg | Freq: Once | INTRAMUSCULAR | Status: AC
  Administered 2018-11-25: 4 mg via INTRAVENOUS
  Filled 2018-11-25: qty 4

## 2018-11-25 MED ORDER — ALBUTEROL SULFATE (2.5 MG/3ML) 0.083% IN NEBU
2.5000 mg | INHALATION_SOLUTION | RESPIRATORY_TRACT | Status: DC | PRN
  Filled 2018-11-25: qty 3

## 2018-11-25 MED ORDER — ACETAMINOPHEN 650 MG RE SUPP: 650.0000 mg | Freq: Four times a day (QID) | RECTAL | Status: DC | PRN

## 2018-11-25 MED ORDER — SODIUM CHLORIDE 0.9 % IV BOLUS
1000.0000 mL | Freq: Once | INTRAVENOUS | Status: AC
  Administered 2018-11-25: 1000 mL via INTRAVENOUS

## 2018-11-25 MED ORDER — ONDANSETRON HCL 4 MG PO TABS: 4.0000 mg | ORAL_TABLET | Freq: Four times a day (QID) | ORAL | Status: DC | PRN

## 2018-11-25 MED ORDER — SODIUM CHLORIDE 0.9 % IV SOLN
2.0000 mg/h | INTRAVENOUS | Status: DC
  Administered 2018-11-25 – 2018-11-27 (×3): 2 mg/h via INTRAVENOUS
  Filled 2018-11-25 (×4): qty 10

## 2018-11-25 MED ORDER — ALBUTEROL SULFATE (2.5 MG/3ML) 0.083% IN NEBU
INHALATION_SOLUTION | RESPIRATORY_TRACT | Status: AC
  Administered 2018-11-25: 10 mg via RESPIRATORY_TRACT
  Filled 2018-11-25: qty 12

## 2018-11-25 MED ORDER — DOCUSATE SODIUM 100 MG PO CAPS
100.0000 mg | ORAL_CAPSULE | Freq: Two times a day (BID) | ORAL | Status: DC
  Filled 2018-11-25: qty 1

## 2018-11-25 MED ORDER — LOSARTAN POTASSIUM-HCTZ 50-12.5 MG PO TABS: 1.0000 | ORAL_TABLET | Freq: Every day | ORAL | Status: DC

## 2018-11-25 MED ORDER — ALBUTEROL SULFATE (2.5 MG/3ML) 0.083% IN NEBU
10.0000 mg | INHALATION_SOLUTION | Freq: Once | RESPIRATORY_TRACT | Status: AC
  Administered 2018-11-25: 10 mg via RESPIRATORY_TRACT

## 2018-11-25 MED ORDER — GABAPENTIN 300 MG PO CAPS
300.0000 mg | ORAL_CAPSULE | Freq: Two times a day (BID) | ORAL | Status: DC
  Administered 2018-11-25 (×2): 300 mg via ORAL
  Filled 2018-11-25 (×2): qty 1

## 2018-11-25 MED ORDER — ACETAMINOPHEN 325 MG PO TABS: 650.0000 mg | ORAL_TABLET | Freq: Four times a day (QID) | ORAL | Status: DC | PRN

## 2018-11-25 MED ORDER — DOCUSATE SODIUM 50 MG/5ML PO LIQD
100.0000 mg | Freq: Two times a day (BID) | ORAL | Status: DC
  Administered 2018-11-25 – 2018-11-26 (×2): 100 mg
  Filled 2018-11-25 (×2): qty 10

## 2018-11-25 MED ORDER — IPRATROPIUM-ALBUTEROL 0.5-2.5 (3) MG/3ML IN SOLN
3.0000 mL | Freq: Once | RESPIRATORY_TRACT | Status: AC
  Administered 2018-11-25: 3 mL via RESPIRATORY_TRACT

## 2018-11-25 MED ORDER — MIDAZOLAM HCL 2 MG/2ML IJ SOLN: 2.0000 mg | INTRAMUSCULAR | Status: DC | PRN

## 2018-11-25 MED ORDER — ENOXAPARIN SODIUM 40 MG/0.4ML ~~LOC~~ SOLN
40.0000 mg | SUBCUTANEOUS | Status: DC
  Administered 2018-11-25: 40 mg via SUBCUTANEOUS
  Filled 2018-11-25: qty 0.4

## 2018-11-25 MED ORDER — PROPOFOL 10 MG/ML IV BOLUS
50.0000 mg | Freq: Once | INTRAVENOUS | Status: AC
  Administered 2018-11-25: 50 mg via INTRAVENOUS

## 2018-11-25 MED ORDER — KETAMINE HCL 10 MG/ML IJ SOLN
200.0000 mg | Freq: Once | INTRAMUSCULAR | Status: AC
  Administered 2018-11-25: 200 mg via INTRAVENOUS

## 2018-11-25 MED ORDER — IPRATROPIUM-ALBUTEROL 0.5-2.5 (3) MG/3ML IN SOLN
RESPIRATORY_TRACT | Status: AC
  Administered 2018-11-25: 3 mL via RESPIRATORY_TRACT
  Filled 2018-11-25: qty 3

## 2018-11-25 MED ORDER — SUCCINYLCHOLINE CHLORIDE 20 MG/ML IJ SOLN
200.0000 mg | Freq: Once | INTRAMUSCULAR | Status: AC
  Administered 2018-11-25: 200 mg via INTRAVENOUS
  Filled 2018-11-25: qty 1

## 2018-11-25 MED ORDER — ORAL CARE MOUTH RINSE
15.0000 mL | OROMUCOSAL | Status: DC
  Administered 2018-11-25 – 2018-12-01 (×57): 15 mL via OROMUCOSAL

## 2018-11-25 MED ORDER — ALBUTEROL SULFATE (2.5 MG/3ML) 0.083% IN NEBU
2.5000 mg | INHALATION_SOLUTION | RESPIRATORY_TRACT | Status: DC
  Administered 2018-11-25 – 2018-11-26 (×12): 2.5 mg via RESPIRATORY_TRACT
  Filled 2018-11-25 (×11): qty 3

## 2018-11-25 MED ORDER — PROPOFOL 1000 MG/100ML IV EMUL
5.0000 ug/kg/min | INTRAVENOUS | Status: DC
  Administered 2018-11-25 (×8): 80 ug/kg/min via INTRAVENOUS
  Administered 2018-11-25: 20 ug/kg/min via INTRAVENOUS
  Administered 2018-11-26 (×2): 80 ug/kg/min via INTRAVENOUS
  Administered 2018-11-26: 70 ug/kg/min via INTRAVENOUS
  Administered 2018-11-26: 60 ug/kg/min via INTRAVENOUS
  Filled 2018-11-25 (×3): qty 100
  Filled 2018-11-25: qty 200
  Filled 2018-11-25 (×9): qty 100

## 2018-11-25 MED ORDER — LORAZEPAM 2 MG/ML IJ SOLN
2.0000 mg | Freq: Once | INTRAMUSCULAR | Status: AC
  Administered 2018-11-25: 2 mg via INTRAVENOUS
  Filled 2018-11-25: qty 1

## 2018-11-25 MED ORDER — TIOTROPIUM BROMIDE MONOHYDRATE 18 MCG IN CAPS
18.0000 ug | ORAL_CAPSULE | Freq: Every day | RESPIRATORY_TRACT | Status: DC
  Filled 2018-11-25: qty 5

## 2018-11-25 MED ORDER — FENTANYL 2500MCG IN NS 250ML (10MCG/ML) PREMIX INFUSION
0.0000 ug/h | INTRAVENOUS | Status: DC
  Administered 2018-11-25 (×2): 200 ug/h via INTRAVENOUS
  Administered 2018-11-25: 100 ug/h via INTRAVENOUS
  Administered 2018-11-26: 350 ug/h via INTRAVENOUS
  Administered 2018-11-26: 300 ug/h via INTRAVENOUS
  Administered 2018-11-27 – 2018-11-28 (×6): 350 ug/h via INTRAVENOUS
  Administered 2018-11-28: 300 ug/h via INTRAVENOUS
  Administered 2018-11-29 (×2): 225 ug/h via INTRAVENOUS
  Administered 2018-11-30 (×2): 250 ug/h via INTRAVENOUS
  Administered 2018-12-01: 275 ug/h via INTRAVENOUS
  Filled 2018-11-25 (×17): qty 250

## 2018-11-25 MED ORDER — MONTELUKAST SODIUM 10 MG PO TABS
10.0000 mg | ORAL_TABLET | Freq: Every day | ORAL | Status: DC
  Administered 2018-11-25: 10 mg via ORAL
  Filled 2018-11-25: qty 1

## 2018-11-25 MED ORDER — LOSARTAN POTASSIUM 25 MG PO TABS
50.0000 mg | ORAL_TABLET | Freq: Every day | ORAL | Status: DC
  Administered 2018-11-25: 50 mg via ORAL
  Filled 2018-11-25: qty 2

## 2018-11-25 NOTE — ED Notes (Signed)
ED TO INPATIENT HANDOFF REPORT  Name/Age/Gender Samantha [name] 52 y.o. female  Code Status Code Status History    Date Active Date Inactive Code Status Order ID Comments User Context   07/30/2018 1601 08/01/2018 1453 Full Code [id number]  Shaune Pollack, MD ED   02/21/2018 1126 02/23/2018 0138 Full Code [id number]  Houston Siren, MD Inpatient   10/06/2015 0407 10/08/2015 1307 Full Code [id number]  Arnaldo Natal, MD Inpatient   08/30/2015 1119 08/31/2015 1811 Full Code [id number]  Enedina Finner, MD Inpatient      Home/SNF/Other Home  Chief Complaint Shortness of Breath  Level of Care/Admitting Diagnosis ED Disposition    ED Disposition Condition Comment   Admit  Hospital Area: [facility] [[id number]]  Level of Care: Stepdown [14]  Diagnosis: Acute respiratory failure with hypoxemia [facility]) [[id number]]  Admitting Physician: Arnaldo Natal [[id number]]  Attending Physician: Arnaldo Natal [[id number]]  Estimated length of stay: past midnight tomorrow  Certification:: I certify this patient will need inpatient services for at least 2 midnights  PT Class (Do Not Modify): Inpatient [101]  PT Acc Code (Do Not Modify): Private [1]       Medical History Past Medical History:  Diagnosis Date  . Acute on chronic respiratory failure with hypoxemia (HCC) 08/30/2015  . Asthma   . COPD (chronic obstructive pulmonary disease) (HCC)   . Diabetes mellitus without complication (HCC)   . Hypertension     Allergies Allergies  Allergen Reactions  . Percocet [Oxycodone-Acetaminophen] Itching    IV Location/Drains/Wounds Patient Lines/Drains/Airways Status   Active Line/Drains/Airways    Name:   Placement date:   Placement time:   Site:   Days:   Peripheral IV 11/25/18 Left Antecubital   11/25/18    0225    Antecubital   less than 1   Peripheral IV 11/25/18 Right Forearm   11/25/18    0628    Forearm   less than 1   Peripheral IV 11/25/18 Left External jugular   11/25/18     0630    External jugular   less than 1   NG/OG Tube Orogastric 16 Fr. Center mouth Aucultation 55 cm   11/25/18    0625    Center mouth   less than 1   Urethral Catheter COLE RN Coude 16 Fr.   11/25/18    6440    Coude   less than 1   Airway 7.5 mm   11/25/18    0624     less than 1          Labs/Imaging Results for orders placed or performed during the hospital encounter of 11/25/18 (from the past 48 hour(s))  Blood gas, venous     Status: Abnormal (Preliminary result)   Collection Time: 11/25/18  2:03 AM  Result Value Ref Range   FIO2 PENDING    pH, Ven 7.39 7.250 - 7.430   pCO2, Ven 46 44.0 - 60.0 mmHg   pO2, Ven 70.0 (H) 32.0 - 45.0 mmHg   Bicarbonate 27.8 20.0 - 28.0 mmol/L   Acid-Base Excess 2.2 (H) 0.0 - 2.0 mmol/L   O2 Saturation 93.6 %   Patient temperature 37.0    Collection site VENOUS    Sample type VENOUS     Comment: Performed at [facility], [address], [place], [place] [address]   Mechanical Rate PENDING   Comprehensive metabolic panel     Status: Abnormal   Collection Time: 11/25/18  2:17 AM  Result Value Ref Range   Sodium 139 135 - 145 mmol/L   Potassium 4.3 3.5 - 5.1 mmol/L   Chloride 108 98 - 111 mmol/L   CO2 24 22 - 32 mmol/L   Glucose, Bld 149 (H) 70 - 99 mg/dL   BUN 27 (H) 6 - 20 mg/dL   Creatinine, Ser 1.61 (H) 0.44 - 1.00 mg/dL   Calcium 9.1 8.9 - 09.6 mg/dL   Total Protein 7.2 6.5 - 8.1 g/dL   Albumin 3.9 3.5 - 5.0 g/dL   AST 26 15 - 41 U/L   ALT 43 0 - 44 U/L   Alkaline Phosphatase 80 38 - 126 U/L   Total Bilirubin 0.4 0.3 - 1.2 mg/dL   GFR calc non Af Amer 40 (L) >60 mL/min   GFR calc Af Amer 46 (L) >60 mL/min   Anion gap 7 5 - 15    Comment: Performed at [facility], [address] [place], [place] [address]  Brain natriuretic peptide     Status: None   Collection Time: 11/25/18  2:17 AM  Result Value Ref Range   B Natriuretic Peptide 19.0 0.0 - 100.0 pg/mL    Comment: Performed at [facility],  [address] [place], [place] [address]  Troponin I - Once     Status: None   Collection Time: 11/25/18  2:17 AM  Result Value Ref Range   Troponin I <0.03 <0.03 ng/mL    Comment: Performed at [facility], [address] [place], [place] [address]  CBC with Differential     Status: Abnormal   Collection Time: 11/25/18  2:17 AM  Result Value Ref Range   WBC 10.0 4.0 - 10.5 K/uL   RBC 4.89 3.87 - 5.11 MIL/uL   Hemoglobin 14.3 12.0 - 15.0 g/dL   HCT 95.6 21.3 - 08.6 %   MCV 89.8 80.0 - 100.0 fL   MCH 29.2 26.0 - 34.0 pg   MCHC 32.6 30.0 - 36.0 g/dL   RDW 57.8 46.9 - 62.9 %   Platelets 353 150 - 400 K/uL   nRBC 0.0 0.0 - 0.2 %   Neutrophils Relative % 45 %   Neutro Abs 4.4 1.7 - 7.7 K/uL   Lymphocytes Relative 45 %   Lymphs Abs 4.5 (H) 0.7 - 4.0 K/uL   Monocytes Relative 7 %   Monocytes Absolute 0.7 0.1 - 1.0 K/uL   Eosinophils Relative 3 %   Eosinophils Absolute 0.3 0.0 - 0.5 K/uL   Basophils Relative 0 %   Basophils Absolute 0.0 0.0 - 0.1 K/uL   Immature Granulocytes 0 %   Abs Immature Granulocytes 0.04 0.00 - 0.07 K/uL    Comment: Performed at [facility], [address] [place], [place] [address]  Influenza panel by PCR (type A & B)     Status: None   Collection Time: 11/25/18  2:17 AM  Result Value Ref Range   Influenza A By PCR NEGATIVE NEGATIVE   Influenza B By PCR NEGATIVE NEGATIVE    Comment: (NOTE) The Xpert Xpress Flu assay is intended as an aid in the diagnosis of  influenza and should not be used as a sole basis for treatment.  This  assay is FDA approved for nasopharyngeal swab specimens only. Nasal  washings and aspirates are unacceptable for Xpert Xpress Flu testing. Performed at [facility], [address] [place], [place] [address]   Blood gas, arterial  Status: Abnormal   Collection Time: 11/25/18  6:49 AM  Result Value Ref Range   FIO2 0.40    Delivery systems VENTILATOR    Mode ASSIST CONTROL    VT 450 mL    LHR 18 resp/min   Peep/cpap 5.0 cm H20   pH, Arterial 7.18 (LL) 7.350 - 7.450    Comment: CRITICAL RESULT CALLED TO, READ BACK BY AND VERIFIED WITH:  NOEL, RN AT 1610 ON [id number]    pCO2 arterial 62 (H) 32.0 - 48.0 mmHg   pO2, Arterial 71 (L) 83.0 - 108.0 mmHg   Bicarbonate 23.1 20.0 - 28.0 mmol/L   Acid-base deficit 6.2 (H) 0.0 - 2.0 mmol/L   O2 Saturation 89.1 %   Patient temperature 37.0    Collection site LEFT RADIAL    Sample type ARTERIAL DRAW     Comment: Performed at [facility], [address], [place], [place] [address]   Dg Chest Port 1 View  Result Date: 11/25/2018 CLINICAL DATA:  Status post ET tube and OG placement. EXAM: PORTABLE CHEST 1 VIEW COMPARISON:  Portable film earlier in the day. FINDINGS: Normal heart size. Clear lung fields. ET tube 4 cm above carina, satisfactory position. Orogastric tube tip lies somewhere in the stomach. Stable appearance from priors. IMPRESSION: Satisfactory ET tube placement. No active cardiopulmonary disease. Electronically Signed   By: Elsie Stain M.D.   On: 11/25/2018 07:06   Dg Chest Port 1 View  Result Date: 11/25/2018 CLINICAL DATA:  Shortness of breath for 1 week. EXAM: PORTABLE CHEST 1 VIEW COMPARISON:  Radiograph 10/04/2018 FINDINGS: The cardiomediastinal contours are normal. Chronic hyperinflation and bronchial thickening. Pulmonary vasculature is normal. No consolidation, pleural effusion, or pneumothorax. No acute osseous abnormalities are seen. IMPRESSION: Chronic hyperinflation and bronchial thickening. No acute findings. Electronically Signed   By: Narda Rutherford M.D.   On: 11/25/2018 02:59    Pending Labs Unresulted Labs (From admission, onward)    Start     Ordered   Signed and Held  Creatinine, serum  (enoxaparin (LOVENOX)    CrCl >/= 30 ml/min)  Weekly,   R    Comments:  while on enoxaparin therapy    Signed and Held   Signed and Held  TSH  Add-on,   R     Signed and Held           Vitals/Pain Today's Vitals   11/25/18 0710 11/25/18 0715 11/25/18 0720 11/25/18 0730  BP: (!) 124/98 (!) 129/97 (!) 133/97 108/72  Pulse: (!) 105 (!) 104 (!) 103 98  Resp: 12 10 10 17   Temp:      TempSrc:      SpO2: 96% 96% 96% 96%  Weight:      Height:      PainSc:        Isolation Precautions No active isolations  Medications Medications  fentaNYL in NS (19mcg/ml) infusion-PREMIX (100 mcg/hr Intravenous Rate/Dose Verify 11/25/18 0728)  propofol (DIPRIVAN) 1000 MG/100ML infusion (80 mcg/kg/min  118.4 kg Intravenous Rate/Dose Change 11/25/18 0734)  ipratropium-albuterol (DUONEB) 0.5-2.5 (3) MG/3ML nebulizer solution 3 mL (3 mLs Nebulization Given 11/25/18 0216)  albuterol (PROVENTIL) (2.5 MG/3ML) 0.083% nebulizer solution 10 mg (10 mg Nebulization Given 11/25/18 0217)  sodium chloride 0.9 % bolus 1,000 mL (0 mLs Intravenous Stopped 11/25/18 0616)  LORazepam (ATIVAN) injection 2 mg (2 mg Intravenous Given 11/25/18 0235)  azithromycin (ZITHROMAX) 500 mg in sodium chloride 0.9 % 250 mL IVPB ( Intravenous Stopped 11/25/18 0505)  fentaNYL (SUBLIMAZE) injection 200 mcg (200 mcg Intravenous Given 11/25/18 0620)  ketamine (KETALAR) injection 200 mg (200 mg Intravenous Given 11/25/18 0621)  succinylcholine (ANECTINE) injection 200 mg (200 mg Intravenous Given 11/25/18 0623)  sodium chloride 0.9 % bolus 1,000 mL (1,000 mLs Intravenous New Bag/Given 11/25/18 0615)  propofol (DIPRIVAN) 10 mg/mL bolus/IV push 80 mg (80 mg Intravenous Given 11/25/18 0638)  propofol (DIPRIVAN) 10 mg/mL bolus/IV push 50 mg (50 mg Intravenous Given 11/25/18 0725)    Mobility Walks when at home

## 2018-11-25 NOTE — ED Provider Notes (Addendum)
[facility] Emergency Department Provider Note  ____________________________________________   First MD Initiated Contact with Patient 11/25/18 0202     (approximate)  I have reviewed the triage vital signs and the nursing notes.   HISTORY  Chief Complaint No chief complaint on file.   HPI Samantha [name] is a 52 y.o. female who comes to the emergency department via EMS with roughly 24 hours of rapidly progressive shortness of breath and productive cough.  She has a past medical history of COPD and uses 2 L of nasal cannula at night only.  When EMS arrived she was saturating 88% on room air with elevated work of breathing.  EMS put her on 4 L of nasal cannula and administered 2 DuoNeb's as well as 125 mg of Solu-Medrol with some improvement in her symptoms.  She says that she is exhausted when even taking 2 or 3 steps.  No particular pain.  Denies fevers or chills.  Did not get a flu shot this year.  Symptoms came on gradually were slowly progressive are now severe.  They are non-positional.   She has been intubated once in the past for COPD   Past Medical History:  Diagnosis Date  . Acute on chronic respiratory failure with hypoxemia ([place]) 08/30/2015  . Asthma   . COPD (chronic obstructive pulmonary disease) ([place])   . Diabetes mellitus without complication ([place])   . Hypertension     Patient Active Problem List   Diagnosis Date Noted  . Acute respiratory failure with hypoxemia ([place]) 11/25/2018  . COPD exacerbation ([place]) 02/21/2018  . Acute on chronic respiratory failure ([place]) 10/06/2015  . Acute on chronic respiratory failure with hypoxemia ([place]) 08/30/2015    Past Surgical History:  Procedure Laterality Date  . none      Prior to Admission medications   Medication Sig Start Date End Date Taking? Authorizing Provider  albuterol (PROVENTIL HFA;VENTOLIN HFA) 108 (90 Base) MCG/ACT inhaler Inhale 2 puffs into the lungs every 6 (six) hours as needed  for wheezing or shortness of breath. 08/01/18  Yes Demetrios Loll, MD  budesonide-formoterol [facility]) 160-4.5 MCG/ACT inhaler Inhale 2 puffs into the lungs 2 (two) times daily. 10/04/18  Yes Lisa Roca, MD  gabapentin (NEURONTIN) 300 MG capsule Take 300 mg by mouth 2 (two) times daily.   Yes [provider]  glimepiride (AMARYL) 4 MG tablet Take 4 mg by mouth daily.   Yes [provider]  ipratropium-albuterol (DUONEB) 0.5-2.5 (3) MG/3ML SOLN Take 3 mLs by nebulization every 6 (six) hours as needed (shortness of breath).   Yes [provider]  losartan-hydrochlorothiazide (HYZAAR) 50-12.5 MG tablet Take 1 tablet by mouth daily. 02/22/16  Yes [provider]  metFORMIN (GLUCOPHAGE) 850 MG tablet Take 850 mg by mouth 2 (two) times daily with a meal.  12/11/15  Yes [provider]  montelukast (SINGULAIR) 10 MG tablet Take 10 mg by mouth at bedtime.    Yes [provider]  tiotropium (SPIRIVA) 18 MCG inhalation capsule Place 18 mcg into inhaler and inhale daily.   Yes [provider]    Allergies Percocet [oxycodone-acetaminophen]  Family History  Problem Relation Age of Onset  . Asthma Mother   . Diabetes Mother   . Hypertension Mother   . Asthma Sister   . Heart attack Father     Social History Social History   Tobacco Use  . Smoking status: Current Every Day Smoker    Packs/day: 0.10  Years: 23.00    Pack years: 2.30    Types: Cigarettes  . Smokeless tobacco: Never Used  Substance Use Topics  . Alcohol use: Yes  . Drug use: Yes    Types: Cocaine    Comment: 2 weeks ago snorted it.      Review of Systems Constitutional: No fever/chills Eyes: No visual changes. ENT: No sore throat. Cardiovascular: Denies chest pain. Respiratory: Positive for shortness of breath. Gastrointestinal: No abdominal pain.  No nausea, no vomiting.  No diarrhea.  No constipation. Genitourinary: Negative for dysuria. Musculoskeletal:  Negative for back pain. Skin: Negative for rash. Neurological: Negative for headaches, focal weakness or numbness.   ____________________________________________   PHYSICAL EXAM:  VITAL SIGNS: ED Triage Vitals  Enc Vitals Group     BP      Pulse      Resp      Temp      Temp src      SpO2      Weight      Height      Head Circumference      Peak Flow      Pain Score      Pain Loc      Pain Edu?      Excl. in [place]?     Constitutional: Appears extremely uncomfortable gasping for air breathing about 40 times a minute speaking in 1-2 word sentences using accessory muscles sitting bolt upright in bed Eyes: PERRL EOMI. Head: Atraumatic. Nose: No congestion/rhinnorhea. Mouth/Throat: No trismus Neck: No stridor.   Cardiovascular: Tachycardic rate, regular rhythm. Grossly normal heart sounds.  Good peripheral circulation. Respiratory: Moderate to severe respiratory distress with very tight sounding lungs.  Rhonchi and wheezing in all fields.  Moving limited amounts of air Gastrointestinal: Obese soft nontender Musculoskeletal: No lower extremity edema   Neurologic:No gross focal neurologic deficits are appreciated. Skin: Diaphoretic Psychiatric: Very anxious appearing.    ____________________________________________   DIFFERENTIAL includes but not limited to  COPD exacerbation, pneumonia, pneumothorax, acute coronary syndrome, pulmonary edema, pulmonary embolism ____________________________________________   LABS (all labs ordered are listed, but only abnormal results are displayed)  Labs Reviewed  BLOOD GAS, VENOUS - Abnormal; Notable for the following components:      Result Value   pO2, Ven 70.0 (*)    Acid-Base Excess 2.2 (*)    All other components within normal limits  COMPREHENSIVE METABOLIC PANEL - Abnormal; Notable for the following components:   Glucose, Bld 149 (*)    BUN 27 (*)    Creatinine, Ser 1.49 (*)    GFR calc non Af Amer 40 (*)    GFR calc Af  Amer 46 (*)    All other components within normal limits  CBC WITH DIFFERENTIAL/PLATELET - Abnormal; Notable for the following components:   Lymphs Abs 4.5 (*)    All other components within normal limits  BLOOD GAS, ARTERIAL - Abnormal; Notable for the following components:   pH, Arterial 7.18 (*)    pCO2 arterial 62 (*)    pO2, Arterial 71 (*)    Acid-base deficit 6.2 (*)    All other components within normal limits  BRAIN NATRIURETIC PEPTIDE  TROPONIN I  INFLUENZA PANEL BY PCR (TYPE A & B)    Lab work reviewed by me with no clear etiology of the patient's symptoms identified.  Elevated creatinine is at the patient's baseline __________________________________________  EKG  ED ECG REPORT I, Darel Hong, the attending physician, personally viewed and interpreted  this ECG.  Date: 11/25/2018 EKG Time:  Rate: 110 Rhythm: Sinus tachycardia QRS Axis: normal Intervals: normal ST/T Wave abnormalities: normal Narrative Interpretation: no evidence of acute ischemia  ____________________________________________  RADIOLOGY  Chest x-ray reviewed by me with chronic changes but no acute disease ____________________________________________   PROCEDURES  Procedure(s) performed: no  .Critical Care Performed by: Darel Hong, MD Authorized by: Darel Hong, MD   Critical care provider statement:    Critical care time (minutes):  65   Critical care time was exclusive of:  Separately billable procedures and treating other patients   Critical care was necessary to treat or prevent imminent or life-threatening deterioration of the following conditions:  Respiratory failure   Critical care was time spent personally by me on the following activities:  Development of treatment plan with patient or surrogate, discussions with consultants, evaluation of patient's response to treatment, examination of patient, obtaining history from patient or surrogate, ordering and performing  treatments and interventions, ordering and review of laboratory studies, ordering and review of radiographic studies, pulse oximetry, re-evaluation of patient's condition and review of old charts Procedure Name: Intubation Date/Time: 11/25/2018 6:30 AM Performed by: Darel Hong, MD Pre-anesthesia Checklist: Patient identified, Patient being monitored, Emergency Drugs available, Timeout performed and Suction available Oxygen Delivery Method: Non-rebreather mask Preoxygenation: Pre-oxygenation with 100% oxygen Induction Type: Rapid sequence Ventilation: Mask ventilation without difficulty Laryngoscope Size: Mac and 4 Tube size: 7.5 mm Number of attempts: 1 Airway Equipment and Method: Patient positioned with wedge pillow Placement Confirmation: ETT inserted through vocal cords under direct vision,  CO2 detector and Breath sounds checked- equal and bilateral Secured at: 24 cm Tube secured with: ETT holder    OG placement Date/Time: 11/25/2018 6:30 AM Performed by: Darel Hong, MD Authorized by: Darel Hong, MD  Consent: The procedure was performed in an emergent situation.  Sedation: Patient sedated: yes Vitals: Vital signs were monitored during sedation.  Patient tolerance: Patient tolerated the procedure well with no immediate complications     Critical Care performed: Yes  ____________________________________________   INITIAL IMPRESSION / ASSESSMENT AND PLAN / ED COURSE  Pertinent labs & imaging results that were available during my care of the patient were reviewed by me and considered in my medical decision making (see chart for details).   As part of my medical decision making, I reviewed the following data within the [place] History obtained from family if available, nursing notes, old chart and ekg, as well as notes from prior ED visits.  Patient comes to the emergency department and respiratory distress.  She is breathing about 40  times a minute and appears extremely uncomfortable speaking in short gasps.  Of ordered BiPAP as the patient says this is helped her in the past.  We will give 1 more DuoNeb to complete the 3 and give 10 more milligrams of albuterol.  Steroids already given.  She appears clinically dry so I will give a liter of fluids as well as a azithromycin.  ----------------------------------------- 3:16 AM on 11/25/2018 -----------------------------------------  The patient feels significantly improved after Ativan and BiPAP.  Her lungs are still quite tight although is moving more air.  At this point she requires stepdown admission.  The patient verbalizes understanding agreement with the plan.  I discussed with the hospitalist Dr. Marcille Blanco who has graciously agreed to admit the patient to his service.     ----------------------------------------- 6:09 AM on 11/25/2018 ----------------------------------------- The patient has become more lethargic and obtunded.  She is  not tolerating BiPAP and ripped the mask off.  Her respiratory status remains poor with respiratory rate in the 40s and her lungs remain rhonchorous and tight.  Decision was made for endotracheal intubation for the patient's own safety.  Intubated with fentanyl and ketamine and succinylcholine and subsequently the patient was somewhat challenging to sedate and required high doses of propofol and fentanyl including multiple boluses of both.  I updated the hospitalist Dr. Marcille Blanco prior to intubation and he understood and agreed with the plan.  ____________________________________________   FINAL CLINICAL IMPRESSION(S) / ED DIAGNOSES  Final diagnoses:  COPD exacerbation ([place])  Respiratory distress  Acute on chronic respiratory failure with hypoxia and hypercapnia ([place])      NEW MEDICATIONS STARTED DURING THIS VISIT:  New Prescriptions   No medications on file     Note:  This document was prepared using Dragon voice recognition  software and may include unintentional dictation errors.     Darel Hong, MD 11/25/18 0981    Darel Hong, MD 11/25/18 [phone]

## 2018-11-25 NOTE — H&P (Signed)
Samantha [name] is an 52 y.o. female.   Chief Complaint: Shortness of breath HPI: The patient with past medical history of COPD, hypertension and diabetes presents to the emergency department complaining of shortness of breath.  She has had progressive dyspnea and cough x24 hours.  She reports scant amounts of sputum with her cough.  The patient received multiple breathing treatments and Solu-Medrol in the emergency department without improvement.  She had been on BiPAP for approximately 3 hours but continued to have increased work of breathing as well as agitation which prompted the emergency department staff to intubate.  The hospital service was called for further management.  Past Medical History:  Diagnosis Date  . Acute on chronic respiratory failure with hypoxemia ([facility]) 08/30/2015  . Asthma   . COPD (chronic obstructive pulmonary disease) ([facility])   . Diabetes mellitus without complication ([facility])   . Hypertension     Past Surgical History:  Procedure Laterality Date  . none      Family History  Problem Relation Age of Onset  . Asthma Mother   . Diabetes Mother   . Hypertension Mother   . Asthma Sister   . Heart attack Father    Social History:  reports that she has been smoking cigarettes. She has a 2.30 pack-year smoking history. She has never used smokeless tobacco. She reports current alcohol use. She reports current drug use. Drug: Cocaine.  Allergies:  Allergies  Allergen Reactions  . Percocet [Oxycodone-Acetaminophen] Itching    (Not in a hospital admission)   Results for orders placed or performed during the hospital encounter of 11/25/18 (from the past 48 hour(s))  Blood gas, venous     Status: Abnormal (Preliminary result)   Collection Time: 11/25/18  2:03 AM  Result Value Ref Range   FIO2 PENDING    pH, Ven 7.39 7.250 - 7.430   pCO2, Ven 46 44.0 - 60.0 mmHg   pO2, Ven 70.0 (H) 32.0 - 45.0 mmHg   Bicarbonate 27.8 20.0 - 28.0 mmol/L   Acid-Base Excess 2.2 (H)  0.0 - 2.0 mmol/L   O2 Saturation 93.6 %   Patient temperature 37.0    Collection site VENOUS    Sample type VENOUS     Comment: Performed at [facility], [address] [place], [place] [address]   Mechanical Rate PENDING   Comprehensive metabolic panel     Status: Abnormal   Collection Time: 11/25/18  2:17 AM  Result Value Ref Range   Sodium 139 135 - 145 mmol/L   Potassium 4.3 3.5 - 5.1 mmol/L   Chloride 108 98 - 111 mmol/L   CO2 24 22 - 32 mmol/L   Glucose, Bld 149 (H) 70 - 99 mg/dL   BUN 27 (H) 6 - 20 mg/dL   Creatinine, Ser 6.04 (H) 0.44 - 1.00 mg/dL   Calcium 9.1 8.9 - 54.0 mg/dL   Total Protein 7.2 6.5 - 8.1 g/dL   Albumin 3.9 3.5 - 5.0 g/dL   AST 26 15 - 41 U/L   ALT 43 0 - 44 U/L   Alkaline Phosphatase 80 38 - 126 U/L   Total Bilirubin 0.4 0.3 - 1.2 mg/dL   GFR calc non Af Amer 40 (L) >60 mL/min   GFR calc Af Amer 46 (L) >60 mL/min   Anion gap 7 5 - 15    Comment: Performed at [facility], [address], [place], [place] [address]  Brain natriuretic peptide  Status: None   Collection Time: 11/25/18  2:17 AM  Result Value Ref Range   B Natriuretic Peptide 19.0 0.0 - 100.0 pg/mL    Comment: Performed at [facility], [address] [place], [place] [address]  Troponin I - Once     Status: None   Collection Time: 11/25/18  2:17 AM  Result Value Ref Range   Troponin I <0.03 <0.03 ng/mL    Comment: Performed at [facility], [address] [place], [place] [address]  CBC with Differential     Status: Abnormal   Collection Time: 11/25/18  2:17 AM  Result Value Ref Range   WBC 10.0 4.0 - 10.5 K/uL   RBC 4.89 3.87 - 5.11 MIL/uL   Hemoglobin 14.3 12.0 - 15.0 g/dL   HCT 82.9 56.2 - 13.0 %   MCV 89.8 80.0 - 100.0 fL   MCH 29.2 26.0 - 34.0 pg   MCHC 32.6 30.0 - 36.0 g/dL   RDW 86.5 78.4 - 69.6 %   Platelets 353 150 - 400 K/uL   nRBC 0.0 0.0 - 0.2 %   Neutrophils Relative % 45 %   Neutro Abs 4.4 1.7 - 7.7 K/uL    Lymphocytes Relative 45 %   Lymphs Abs 4.5 (H) 0.7 - 4.0 K/uL   Monocytes Relative 7 %   Monocytes Absolute 0.7 0.1 - 1.0 K/uL   Eosinophils Relative 3 %   Eosinophils Absolute 0.3 0.0 - 0.5 K/uL   Basophils Relative 0 %   Basophils Absolute 0.0 0.0 - 0.1 K/uL   Immature Granulocytes 0 %   Abs Immature Granulocytes 0.04 0.00 - 0.07 K/uL    Comment: Performed at [facility], [address] [place], [place] [address]  Influenza panel by PCR (type A & B)     Status: None   Collection Time: 11/25/18  2:17 AM  Result Value Ref Range   Influenza A By PCR NEGATIVE NEGATIVE   Influenza B By PCR NEGATIVE NEGATIVE    Comment: (NOTE) The Xpert Xpress Flu assay is intended as an aid in the diagnosis of  influenza and should not be used as a sole basis for treatment.  This  assay is FDA approved for nasopharyngeal swab specimens only. Nasal  washings and aspirates are unacceptable for Xpert Xpress Flu testing. Performed at [facility], [address] [place], [place] [address]    Dg Chest Port 1 View  Result Date: 11/25/2018 CLINICAL DATA:  Shortness of breath for 1 week. EXAM: PORTABLE CHEST 1 VIEW COMPARISON:  Radiograph 10/04/2018 FINDINGS: The cardiomediastinal contours are normal. Chronic hyperinflation and bronchial thickening. Pulmonary vasculature is normal. No consolidation, pleural effusion, or pneumothorax. No acute osseous abnormalities are seen. IMPRESSION: Chronic hyperinflation and bronchial thickening. No acute findings. Electronically Signed   By: Narda Rutherford M.D.   On: 11/25/2018 02:59    Review of Systems  Constitutional: Negative for chills and fever.  HENT: Negative for sore throat and tinnitus.   Eyes: Negative for blurred vision and redness.  Respiratory: Positive for cough and shortness of breath.   Cardiovascular: Negative for chest pain, palpitations, orthopnea and PND.  Gastrointestinal: Negative for abdominal pain, diarrhea, nausea and  vomiting.  Genitourinary: Negative for dysuria, frequency and urgency.  Musculoskeletal: Negative for joint pain and myalgias.  Skin: Negative for rash.       No lesions  Neurological: Negative for speech change, focal weakness and weakness.  Endo/Heme/Allergies: Does not bruise/bleed easily.  No temperature intolerance  Psychiatric/Behavioral: Negative for depression and suicidal ideas.    Blood pressure (!) 148/108, pulse (!) 101, temperature (!) 97.5 F (36.4 C), temperature source Oral, resp. rate (!) 24, height 5\' 7"  (1.702 m), weight 118.4 kg, SpO2 100 %. Physical Exam  Vitals reviewed. Constitutional: She is oriented to person, place, and time. She appears well-developed and well-nourished. No distress.  HENT:  Head: Normocephalic and atraumatic.  Mouth/Throat: Oropharynx is clear and moist.  Eyes: Pupils are equal, round, and reactive to light. Conjunctivae and EOM are normal. No scleral icterus.  Neck: Normal range of motion. Neck supple. No JVD present. No tracheal deviation present. No thyromegaly present.  Cardiovascular: Normal rate, regular rhythm and normal heart sounds. Exam reveals no gallop and no friction rub.  No murmur heard. Respiratory: Effort normal and breath sounds normal.  GI: Soft. Bowel sounds are normal. She exhibits no distension. There is no abdominal tenderness.  Genitourinary:    Genitourinary Comments: Deferred   Musculoskeletal: Normal range of motion.        General: No edema.  Lymphadenopathy:    She has no cervical adenopathy.  Neurological: She is alert and oriented to person, place, and time. No cranial nerve deficit. She exhibits normal muscle tone.  Skin: Skin is warm and dry. No rash noted. No erythema.  Psychiatric: She has a normal mood and affect. Her behavior is normal. Judgment and thought content normal.     Assessment/Plan This is a 52 year old female admitted for respiratory failure. 1.  Respiratory failure: Acute; with  hypoxemia.  The patient has failed BiPAP and is now intubated.  Continue supplemental oxygen and albuterol as needed. 2.  COPD: With exacerbation; patient has already received Solu-Medrol which we will continue.  Taper once the patient stabilized.  Azithromycin for anti-inflammatory effect. 3.  Hypertension: Controlled; resume losartan and hydrochlorothiazide once the patient is able to take oral medications.  Antihypertensive medications IV as needed while intubated. 4.  Diabetes mellitus type 2: Sliding scale insulin while hospitalized.  Hold metformin.  Gabapentin for neuropathy 5.  DVT prophylaxis: Lovenox 6.  GI prophylaxis: PPI after 24 hours intubation The patient is a full code.  I have personally spent 45 minutes in critical care time with this patient.  Discussed with [facility] telemedicine  Arnaldo Nataliamond,  Chrisie Jankovich S, MD 11/25/2018, 4:54 AM

## 2018-11-25 NOTE — Consult Note (Signed)
[facility]      Date: 11/25/2018,   MRN# [id number] Samantha [name] [birth year] Code Status:     Code Status Orders  (From admission, onward)            Referred By Dr Joycelyn Ruadiamond,Michael    AdmissionWeight: 118.4 kg                 CurrentWeight: 121.1 kg Samantha [name] is a 52 y.o. old female seen in consultation for resp failure/on vent at the request of Dr Sloan Leiteriamond,M     CHIEF COMPLAINT:   Samantha [name]   HISTORY OF PRESENT ILLNESS  Pt is intubated and sedated and unable to give history.history obtained from Dr Eula Listeniamonds H&P. 52 y/o woman with past medical history of COPD, hypertension and diabetes presented to the emergency department with complains of of shortness of breath.  She has had progressive dyspnea and cough x24 hours.  She reports scant amounts of sputum with her cough.  The patient received multiple breathing treatments and Solu-Medrol in the emergency department without improvement.  She was on BiPAP for approximately 3 hours but didn't improve with increasing work of breathing requiring intubation and mechanical [name].  we were was called for further treatment and management in the ICU.     PAST MEDICAL HISTORY   Past Medical History:  Diagnosis Date  . Acute on chronic respiratory failure with hypoxemia (HCC) 08/30/2015  . Asthma   . COPD (chronic obstructive pulmonary disease) (HCC)   . Diabetes mellitus without complication (HCC)   . Hypertension      SURGICAL HISTORY   Past Surgical History:  Procedure Laterality Date  . none       FAMILY HISTORY   Family History  Problem Relation Age of Onset  . Asthma Mother   . Diabetes Mother   . Hypertension Mother   . Asthma Sister   . Heart attack Father      SOCIAL HISTORY   Social History   Tobacco Use  . Smoking status: Current Every Day Smoker    Packs/day: 0.10    Years:  23.00    Pack years: 2.30    Types: Cigarettes  . Smokeless tobacco: Never Used  Substance Use Topics  . Alcohol use: Yes  . Drug use: Yes    Types: Cocaine    Comment: 2 weeks ago snorted it.       MEDICATIONS    Home Medication:    Current Medication:  Current Facility-Administered Medications:  .  acetaminophen (TYLENOL) tablet 650 mg, 650 mg, Oral, Q6H PRN **OR** acetaminophen (TYLENOL) suppository 650 mg, 650 mg, Rectal, Q6H PRN, Arnaldo Nataliamond, Michael S, MD .  albuterol (PROVENTIL) (2.5 MG/3ML) 0.083% nebulizer solution 2.5 mg, 2.5 mg, Nebulization, Q4H PRN, Arnaldo Nataliamond, Michael S, MD .  docusate sodium (COLACE) capsule 100 mg, 100 mg, Oral, BID, Arnaldo Nataliamond, Michael S, MD .  enoxaparin (LOVENOX) injection 40 mg, 40 mg, Subcutaneous, Q24H, Arnaldo Nataliamond, Michael S, MD .  fentaNYL 2500mcg in NS 250mL (9010mcg/ml) infusion-PREMIX, 0-400 mcg/hr, Intravenous, Continuous, Arbie CookeyBashir, Farah Lepak, MD, Last Rate: 20 mL/hr at 11/25/18 0900, 200 mcg/hr at 11/25/18 0900 .  fluticasone furoate-vilanterol (BREO ELLIPTA) 200-25 MCG/INH 1 puff, 1 puff, Inhalation, Daily, Arnaldo Nataliamond, Michael S, MD .  gabapentin (NEURONTIN) capsule 300 mg, 300 mg, Oral, BID, Arnaldo Nataliamond, Michael S, MD, 300 mg at 11/25/18 0955 .  losartan (COZAAR) tablet 50 mg, 50 mg, Oral, Daily, 50 mg at 11/25/18  1610 **AND** hydrochlorothiazide (MICROZIDE) capsule 12.5 mg, 12.5 mg, Oral, Daily, Auburn Bilberry, MD, Stopped at 11/25/18 [phone] .  midazolam (VERSED) injection 2 mg, 2 mg, Intravenous, Q2H PRN, Arbie Cookey, MD .  montelukast (SINGULAIR) tablet 10 mg, 10 mg, Oral, QHS, Arnaldo Natal, MD .  ondansetron [facility]) tablet 4 mg, 4 mg, Oral, Q6H PRN **OR** ondansetron (ZOFRAN) injection 4 mg, 4 mg, Intravenous, Q6H PRN, Arnaldo Natal, MD .  propofol (DIPRIVAN) 1000 MG/100ML infusion, 5-80 mcg/kg/min, Intravenous, Continuous, Rifenbark, Lloyd Huger, MD, Last Rate: 56.8 mL/hr at 11/25/18 0947, 80 mcg/kg/min at 11/25/18 0947 .  tiotropium (SPIRIVA)  inhalation capsule ([facility] use ONLY) 18 mcg, 18 mcg, Inhalation, Daily, Arnaldo Natal, MD    ALLERGIES   Percocet [oxycodone-acetaminophen]     REVIEW OF SYSTEMS    Review of Systems:pt sedated and intubated and I am unable to get ROS. Pl see H&P by dr Sheryle Hail for complete ROS    VS: BP (!) 134/92   Pulse 96   Temp 98.9 F (37.2 C) (Oral)   Resp (!) 7   Ht 5\' 8"  (1.727 m)   Wt 121.1 kg   SpO2 96%   BMI 40.59 kg/m      PHYSICAL EXAM  Physical Examination:   GENERAL:intubated/sedated with resp distress with poor vent resp synchrony HEAD: Normocephalic, atraumatic.  EYES: Pupils equal, round, reactive to light. Extraocular muscles intact. No scleral icterus.  MOUTH: Moist mucosal membrane. Dentition intact. No abscess noted.  EAR, NOSE, THROAT: Clear without exudates. No external lesions.  NECK: Supple. No thyromegaly. No nodules. No JVD.  PULMONARY: Diffuse bilateral wheezes with prolonged expiration. Breath sounds are decreased and she is not moving much ait CARDIOVASCULAR: S1 and S2. Regular rate and rhythm. No murmurs, rubs, or gallops. No edema. Pedal pulses 2+ bilaterally.  GASTROINTESTINAL: Soft, nontender, nondistended. No masses. Positive bowel sounds. No hepatosplenomegaly.  MUSCULOSKELETAL: No swelling, clubbing, or edema. NEUROLOGIC:sedated on vent SKIN: No ulceration, lesions, rashes, or cyanosis. Skin warm and dry. Turgor intact.    LABS   FIO2 PENDING    pH, Ven 7.39 7.250 - 7.430   pCO2, Ven 46 44.0 - 60.0 mmHg   pO2, Ven 70.0 (H) 32.0 - 45.0 mmHg   Bicarbonate 27.8 20.0 - 28.0 mmol/L   Acid-Base Excess 2.2 (H) 0.0 - 2.0 mmol/L   O2 Saturation 93.6 %   Patient temperature 37.0    Collection site VENOUS    Sample type VENOUS     Comment: Performed at [facility], [address] [place], [place] [address]   Mechanical Rate PENDING   Comprehensive metabolic panel     Status: Abnormal   Collection Time:  11/25/18  2:17 AM  Result Value Ref Range   Sodium 139 135 - 145 mmol/L   Potassium 4.3 3.5 - 5.1 mmol/L   Chloride 108 98 - 111 mmol/L   CO2 24 22 - 32 mmol/L   Glucose, Bld 149 (H) 70 - 99 mg/dL   BUN 27 (H) 6 - 20 mg/dL   Creatinine, Ser 1.19 (H) 0.44 - 1.00 mg/dL   Calcium 9.1 8.9 - 14.7 mg/dL   Total Protein 7.2 6.5 - 8.1 g/dL   Albumin 3.9 3.5 - 5.0 g/dL   AST 26 15 - 41 U/L   ALT 43 0 - 44 U/L   Alkaline Phosphatase 80 38 - 126 U/L   Total Bilirubin 0.4 0.3 - 1.2 mg/dL   GFR calc non Af Amer 40 (L) >60  mL/min   GFR calc Af Amer 46 (L) >60 mL/min   Anion gap 7 5 - 15    Comment: Performed at [facility], [address] [place], [place] [address]  Brain natriuretic peptide     Status: None   Collection Time: 11/25/18  2:17 AM  Result Value Ref Range   B Natriuretic Peptide 19.0 0.0 - 100.0 pg/mL    Comment: Performed at [facility], [address] [place], [place] [address]  Troponin I - Once     Status: None   Collection Time: 11/25/18  2:17 AM  Result Value Ref Range   Troponin I <0.03 <0.03 ng/mL    Comment: Performed at [facility], [address] [place], [place] [address]  CBC with Differential     Status: Abnormal   Collection Time: 11/25/18  2:17 AM  Result Value Ref Range   WBC 10.0 4.0 - 10.5 K/uL   RBC 4.89 3.87 - 5.11 MIL/uL   Hemoglobin 14.3 12.0 - 15.0 g/dL   HCT 91.4 78.2 - 95.6 %   MCV 89.8 80.0 - 100.0 fL   MCH 29.2 26.0 - 34.0 pg   MCHC 32.6 30.0 - 36.0 g/dL   RDW 21.3 08.6 - 57.8 %   Platelets 353 150 - 400 K/uL   nRBC 0.0 0.0 - 0.2 %   Neutrophils Relative % 45 %   Neutro Abs 4.4 1.7 - 7.7 K/uL   Lymphocytes Relative 45 %   Lymphs Abs 4.5 (H) 0.7 - 4.0 K/uL   Monocytes Relative 7 %   Monocytes Absolute 0.7 0.1 - 1.0 K/uL   Eosinophils Relative 3 %   Eosinophils Absolute 0.3 0.0 - 0.5 K/uL   Basophils Relative 0 %   Basophils Absolute 0.0 0.0 - 0.1 K/uL    Immature Granulocytes 0 %   Abs Immature Granulocytes 0.04 0.00 - 0.07 K/uL    Comment: Performed at [facility], [address] [place], [place] [address]  Influenza panel by PCR (type A & B)     Status: None   Collection Time: 11/25/18  2:17 AM  Result Value Ref Range   Influenza A By PCR NEGATIVE NEGATIVE   Influenza B By PCR NEGATIVE NEGATIVE    Comment: (NOTE) The Xpert Xpress Flu assay is intended as an aid in the diagnosis of  influenza and should not be used as a sole basis for treatment.  This  assay is FDA approved for nasopharyngeal swab specimens only. Nasal  washings and aspirates are unacceptable for Xpert Xpress Flu testing. Performed at [facility], [address] [place], [place] [address]           IMAGING    Dg Chest Port 1 View  Result Date: 11/25/2018 CLINICAL DATA:  Status post ET tube and OG placement. EXAM: PORTABLE CHEST 1 VIEW COMPARISON:  Portable film earlier in the day. FINDINGS: Normal heart size. Clear lung fields. ET tube 4 cm above carina, satisfactory position. Orogastric tube tip lies somewhere in the stomach. Stable appearance from priors. IMPRESSION: Satisfactory ET tube placement. No active cardiopulmonary disease. Electronically Signed   By: Elsie Stain M.D.   On: 11/25/2018 07:06   Dg Chest Port 1 View  Result Date: 11/25/2018 CLINICAL DATA:  Shortness of breath for 1 week. EXAM: PORTABLE CHEST 1 VIEW COMPARISON:  Radiograph 10/04/2018 FINDINGS: The cardiomediastinal contours are normal. Chronic hyperinflation and bronchial thickening. Pulmonary vasculature is normal. No consolidation, pleural effusion,  or pneumothorax. No acute osseous abnormalities are seen. IMPRESSION: Chronic hyperinflation and bronchial thickening. No acute findings. Electronically Signed   By: Narda Rutherford M.D.   On: 11/25/2018 02:59     ASSESSMENT/PLAN   1. Acute hypoxemic Respiratory failure related to AECOPD.  Adjust vent  to improve patient/vent synchrony. Follow ABG. Avoid high TV and RR. Permissive hypercapnia if needed.  2.  AECOPD, very severe bronchospasm with very poor air movement.rx with high dose IV steroids. Q2 hour albuterol   Cont emperic   Azithromycin for anti-inflammatory effect. No sig evidence of infection to warrant broad spectrum abx. Flu  negative.  3.  Hypertension:  Pt on out pt losartan and hydrochlorothiazide .  Antihypertensive medications IV as needed while intubated.  4.  Diabetes mellitus type 2: Sliding scale insulin while hospitalized.  Hold metformin.  Gabapentin for neuropathy  5.  DVT prophylaxis: Lovenox  6.  GI prophylaxis: PPI .   Full code. Critical care Time spent 45 minutes   Arbie Cookey, MD  11/25/2018 10:14 AM [facility]

## 2018-11-25 NOTE — ED Notes (Signed)
Pt breathing against vent - Dr Roxan Hockeyobinson notified - VO for bolus 75 of propofol now

## 2018-11-25 NOTE — ED Notes (Signed)
CRITICAL LAB: PH is 7.18, Don RT, Dr. Lamont Snowballifenbark notified, orders received

## 2018-11-25 NOTE — Progress Notes (Signed)
Patient desynchronous with ventilator, settings changed per MD request

## 2018-11-25 NOTE — ED Notes (Signed)
RT called to transport pt to ICU  

## 2018-11-25 NOTE — Progress Notes (Signed)
Bipap initiated & tol well. Pt felt immediate improvement in wob.

## 2018-11-25 NOTE — Progress Notes (Signed)
[employer] [facility] [place] [facility]                                                                                                                                                                                  Patient Demographics   Samantha [name], is a 52 y.o. female, DOB - 1966-02-12, UYQ:[id number]  Admit date - 11/25/2018   Admitting Physician Arnaldo NatalMichael S Diamond, MD  Outpatient Primary MD for the patient is [facility]   LOS - 0  Subjective: Patient had to be intubated due to worsening respiratory failure    Review of Systems:   CONSTITUTIONAL: On the mechanical ventilator Vitals:   Vitals:   11/25/18 1200 11/25/18 1300 11/25/18 1341 11/25/18 1400  BP: 102/63 111/61  112/63  Pulse: 78 79  80  Resp: 14 15  17   Temp:      TempSrc:      SpO2: 96% 95% 95% 95%  Weight:      Height:        Wt Readings from Last 3 Encounters:  11/25/18 121.1 kg  09/27/18 121.1 kg  08/12/18 116.1 kg     Intake/Output Summary (Last 24 hours) at 11/25/2018 1540 Last data filed at 11/25/2018 1200 Gross per 24 hour  Intake 2717.84 ml  Output 300 ml  Net 2417.84 ml    Physical Exam:   GENERAL: Critically ill HEAD, EYES, EARS, NOSE AND THROAT: Atraumatic, normocephalic. Extraocular muscles are intact. Pupils equal and reactive to light. Sclerae anicteric. No conjunctival injection. No oro-pharyngeal erythema.  NECK: Supple. There is no jugular venous distention. No bruits, no lymphadenopathy, no thyromegaly.  HEART: Regular rate and rhythm,. No murmurs, no rubs, no clicks.  LUNGS: Diminished breath sounds bilaterally aBDOMEN: Soft, flat, nontender, nondistended. Has good bowel sounds. No hepatosplenomegaly appreciated.  EXTREMITIES: No evidence of any cyanosis, clubbing, or peripheral edema.  +2 pedal and radial pulses bilaterally.  NEUROLOGIC: Sedated  sKIN: Moist and warm with no rashes appreciated.  Psych: Not anxious, depressed LN: No inguinal LN  enlargement    Antibiotics   Anti-infectives (From admission, onward)   Start     Dose/Rate Route Frequency Ordered Stop   11/26/18 1000  azithromycin (ZITHROMAX) 500 mg in sodium chloride 0.9 % 250 mL IVPB     500 mg 250 mL/hr over 60 Minutes Intravenous Every 24 hours 11/25/18 1406     11/25/18 0215  azithromycin (ZITHROMAX) 500 mg in sodium chloride 0.9 % 250 mL IVPB     500 mg 250 mL/hr over 60 Minutes Intravenous  Once 11/25/18 0212 11/25/18 0957      Medications   Scheduled Meds: . albuterol  2.5 mg  Nebulization Q2H  . docusate sodium  100 mg Oral BID  . enoxaparin (LOVENOX) injection  40 mg Subcutaneous Q24H  . fluticasone furoate-vilanterol  1 puff Inhalation Daily  . gabapentin  300 mg Oral BID  . losartan  50 mg Oral Daily   And  . hydrochlorothiazide  12.5 mg Oral Daily  . ipratropium  0.5 mg Nebulization Q4H  . montelukast  10 mg Oral QHS  . tiotropium  18 mcg Inhalation Daily   Continuous Infusions: . [START ON 11/26/2018] azithromycin    . fentaNYL infusion INTRAVENOUS 200 mcg/hr (11/25/18 1200)  . midazolam (VERSED) infusion 2 mg/hr (11/25/18 1200)  . propofol (DIPRIVAN) infusion 80 mcg/kg/min (11/25/18 1344)   PRN Meds:.acetaminophen **OR** acetaminophen, ondansetron **OR** ondansetron (ZOFRAN) IV   Data Review:   Micro Results Recent Results (from the past 240 hour(s))  MRSA PCR Screening     Status: None   Collection Time: 11/25/18  8:43 AM  Result Value Ref Range Status   MRSA by PCR NEGATIVE NEGATIVE Final    Comment:        The GeneXpert MRSA Assay (FDA approved for NASAL specimens only), is one component of a comprehensive MRSA colonization surveillance program. It is not intended to diagnose MRSA infection nor to guide or monitor treatment for MRSA infections. Performed at [facility], [address] [place], [place] [address]     Radiology Reports Dg Chest [place] 1 View  Result Date: 11/25/2018 CLINICAL DATA:  Status  post ET tube and OG placement. EXAM: PORTABLE CHEST 1 VIEW COMPARISON:  Portable film earlier in the day. FINDINGS: Normal heart size. Clear lung fields. ET tube 4 cm above carina, satisfactory position. Orogastric tube tip lies somewhere in the stomach. Stable appearance from priors. IMPRESSION: Satisfactory ET tube placement. No active cardiopulmonary disease. Electronically Signed   By: Elsie Stain M.D.   On: 11/25/2018 07:06   Dg Chest Port 1 View  Result Date: 11/25/2018 CLINICAL DATA:  Shortness of breath for 1 week. EXAM: PORTABLE CHEST 1 VIEW COMPARISON:  Radiograph 10/04/2018 FINDINGS: The cardiomediastinal contours are normal. Chronic hyperinflation and bronchial thickening. Pulmonary vasculature is normal. No consolidation, pleural effusion, or pneumothorax. No acute osseous abnormalities are seen. IMPRESSION: Chronic hyperinflation and bronchial thickening. No acute findings. Electronically Signed   By: Narda Rutherford M.D.   On: 11/25/2018 02:59     CBC Recent Labs  Lab 11/25/18 0217  WBC 10.0  HGB 14.3  HCT 43.9  PLT 353  MCV 89.8  MCH 29.2  MCHC 32.6  RDW 13.6  LYMPHSABS 4.5*  MONOABS 0.7  EOSABS 0.3  BASOSABS 0.0    Chemistries  Recent Labs  Lab 11/25/18 0217  NA 139  K 4.3  CL 108  CO2 24  GLUCOSE 149*  BUN 27*  CREATININE 1.49*  CALCIUM 9.1  AST 26  ALT 43  ALKPHOS 80  BILITOT 0.4   ------------------------------------------------------------------------------------------------------------------ estimated creatinine clearance is 59.8 mL/min (A) (by C-G formula based on SCr of 1.49 mg/dL (H)). ------------------------------------------------------------------------------------------------------------------ No results for input(s): HGBA1C in the last 72 hours. ------------------------------------------------------------------------------------------------------------------ No results for input(s): CHOL, HDL, LDLCALC, TRIG, CHOLHDL, LDLDIRECT in the  last 72 hours. ------------------------------------------------------------------------------------------------------------------ Recent Labs    11/25/18 0217  TSH 1.497   ------------------------------------------------------------------------------------------------------------------ No results for input(s): VITAMINB12, FOLATE, FERRITIN, TIBC, IRON, RETICCTPCT in the last 72 hours.  Coagulation profile No results for input(s): INR, PROTIME in the last 168 hours.  No results for input(s): DDIMER in the last 72  hours.  Cardiac Enzymes Recent Labs  Lab 11/25/18 0217  TROPONINI <0.03   ------------------------------------------------------------------------------------------------------------------ Invalid input(s): POCBNP    Assessment & Plan   This is a 52 year old female admitted for respiratory failure. 1.  Respiratory failure: Acute; with hypoxemia.  The patient has failed BiPAP and is now intubated.  Continue supplemental oxygen and albuterol as needed. 2.  COPD: With exacerbation; p continue IV Solu-Medrol continue nebs as well as azithromycin 3.  Hypertension: Controlled; continue losartan and hydrochlorothiazide  Antihypertensive medications IV as needed while intubated. 4.  Diabetes mellitus type 2: Sliding scale insulin while hospitalized.  Hold metformin.  Gabapentin for neuropathy 5.  DVT prophylaxis: Lovenox 6.  GI prophylaxis: PPI after 24 hours intubation      Code Status Orders  (From admission, onward)         Start     Ordered   11/25/18 0838  Full code  Continuous     11/25/18 0837        Code Status History    Date Active Date Inactive Code Status Order ID Comments User Context   07/30/2018 1601 08/01/2018 1453 Full Code [id number]  Shaune Pollack, MD ED   02/21/2018 1126 02/23/2018 0138 Full Code [id number]  Houston Siren, MD Inpatient   10/06/2015 0407 10/08/2015 1307 Full Code [id number]  Arnaldo Natal, MD Inpatient   08/30/2015 1119 08/31/2015  1811 Full Code [id number]  Enedina Finner, MD Inpatient           Consults pulmonary critical care  DVT Prophylaxis  Lovenox  Lab Results  Component Value Date   PLT 353 11/25/2018     Time Spent in minutes   critical care time spend additional 1pm to 145pm  Greater than 50% of time spent in care coordination and counseling patient regarding the condition and plan of care.   Auburn Bilberry M.D on 11/25/2018 at 3:40 PM  Between 7am to 6pm - Pager - [phone]  After 6pm go to [email or web address] - Social research officer, government  [employer]   Office  [phone]

## 2018-11-25 NOTE — ED Notes (Signed)
Boluses by Dr Lamont Snowballifenbark, [facility]

## 2018-11-25 NOTE — ED Triage Notes (Signed)
Pt arrived from home via EMS with complaints of SOB  X 1 week. Pt has Hx of COPD and asthma. EMS stated that pt was 88%RA when they arrived. EMS placed pt on 4L nasal canula and O2sat went to 100%. EMS gave 2 Duonebs and 125mg  of Solumedrol en route. Pt wheezing in all fields. Pt allergic to Percocet. VS per EMS BP-180/98 HR-110 CBG-107. EMS placed a 20 gauge in left AC. Dr. Lamont Snowballifenbark at bedside

## 2018-11-26 ENCOUNTER — Inpatient Hospital Stay: Payer: Self-pay

## 2018-11-26 ENCOUNTER — Inpatient Hospital Stay: Payer: Medicare HMO

## 2018-11-26 DIAGNOSIS — J441 Chronic obstructive pulmonary disease with (acute) exacerbation: Principal | ICD-10-CM

## 2018-11-26 LAB — CBC
HCT: 41.3 % (ref 36.0–46.0)
Hemoglobin: 12.7 g/dL (ref 12.0–15.0)
MCH: 29.4 pg (ref 26.0–34.0)
MCHC: 30.8 g/dL (ref 30.0–36.0)
MCV: 95.6 fL (ref 80.0–100.0)
Platelets: 293 10*3/uL (ref 150–400)
RBC: 4.32 MIL/uL (ref 3.87–5.11)
RDW: 13.9 % (ref 11.5–15.5)
WBC: 11.6 10*3/uL — AB (ref 4.0–10.5)
nRBC: 0 % (ref 0.0–0.2)

## 2018-11-26 LAB — BASIC METABOLIC PANEL
Anion gap: 6 (ref 5–15)
BUN: 42 mg/dL — ABNORMAL HIGH (ref 6–20)
CALCIUM: 8.4 mg/dL — AB (ref 8.9–10.3)
CO2: 22 mmol/L (ref 22–32)
Chloride: 108 mmol/L (ref 98–111)
Creatinine, Ser: 2.14 mg/dL — ABNORMAL HIGH (ref 0.44–1.00)
GFR calc Af Amer: 30 mL/min — ABNORMAL LOW (ref >60)
GFR calc non Af Amer: 26 mL/min — ABNORMAL LOW (ref >60)
Glucose, Bld: 171 mg/dL — ABNORMAL HIGH (ref 70–99)
Potassium: 7.3 mmol/L (ref 3.5–5.1)
Sodium: 136 mmol/L (ref 135–145)

## 2018-11-26 LAB — BLOOD GAS, ARTERIAL
Acid-base deficit: 6.8 mmol/L — ABNORMAL HIGH (ref 0.0–2.0)
Bicarbonate: 20.2 mmol/L (ref 20.0–28.0)
FIO2: 0.3
MECHVT: 500 mL
Mechanical Rate: 18
O2 Saturation: 98 %
PEEP: 5 cmH2O
Patient temperature: 37
pCO2 arterial: 45 mmHg (ref 32.0–48.0)
pH, Arterial: 7.26 — ABNORMAL LOW (ref 7.350–7.450)
pO2, Arterial: 119 mmHg — ABNORMAL HIGH (ref 83.0–108.0)

## 2018-11-26 LAB — COMPREHENSIVE METABOLIC PANEL
ALT: 34 U/L (ref 0–44)
ANION GAP: 10 (ref 5–15)
AST: 28 U/L (ref 15–41)
Albumin: 3.7 g/dL (ref 3.5–5.0)
Alkaline Phosphatase: 60 U/L (ref 38–126)
BUN: 41 mg/dL — ABNORMAL HIGH (ref 6–20)
CO2: 19 mmol/L — ABNORMAL LOW (ref 22–32)
Calcium: 8.5 mg/dL — ABNORMAL LOW (ref 8.9–10.3)
Chloride: 106 mmol/L (ref 98–111)
Creatinine, Ser: 2.11 mg/dL — ABNORMAL HIGH (ref 0.44–1.00)
GFR calc Af Amer: 30 mL/min — ABNORMAL LOW (ref >60)
GFR calc non Af Amer: 26 mL/min — ABNORMAL LOW (ref >60)
Glucose, Bld: 135 mg/dL — ABNORMAL HIGH (ref 70–99)
Potassium: 5.2 mmol/L — ABNORMAL HIGH (ref 3.5–5.1)
Sodium: 135 mmol/L (ref 135–145)
TOTAL PROTEIN: 6.3 g/dL — AB (ref 6.5–8.1)
Total Bilirubin: 0.9 mg/dL (ref 0.3–1.2)

## 2018-11-26 LAB — GLUCOSE, CAPILLARY
Glucose-Capillary: 147 mg/dL — ABNORMAL HIGH (ref 70–99)
Glucose-Capillary: 120 mg/dL — ABNORMAL HIGH (ref 70–99)
Glucose-Capillary: 156 mg/dL — ABNORMAL HIGH (ref 70–99)

## 2018-11-26 LAB — POTASSIUM: Potassium: 7.2 mmol/L (ref 3.5–5.1)

## 2018-11-26 LAB — MAGNESIUM: Magnesium: 2.4 mg/dL (ref 1.7–2.4)

## 2018-11-26 LAB — PHOSPHORUS: Phosphorus: 6.1 mg/dL — ABNORMAL HIGH (ref 2.5–4.6)

## 2018-11-26 MED ORDER — ADULT MULTIVITAMIN LIQUID CH
15.0000 mL | Freq: Every day | ORAL | Status: DC
  Administered 2018-11-27 – 2018-12-02 (×6): 15 mL
  Filled 2018-11-26 (×9): qty 15

## 2018-11-26 MED ORDER — INSULIN REGULAR HUMAN 100 UNIT/ML IJ SOLN
10.0000 [IU] | INTRAMUSCULAR | Status: AC
  Administered 2018-11-26: 10 [IU] via INTRAVENOUS
  Filled 2018-11-26: qty 10

## 2018-11-26 MED ORDER — FUROSEMIDE 10 MG/ML IJ SOLN
40.0000 mg | INTRAMUSCULAR | Status: AC
  Administered 2018-11-26: 40 mg via INTRAVENOUS
  Filled 2018-11-26: qty 4

## 2018-11-26 MED ORDER — GABAPENTIN 250 MG/5ML PO SOLN
300.0000 mg | Freq: Two times a day (BID) | ORAL | Status: DC
  Administered 2018-11-26 – 2018-12-03 (×14): 300 mg
  Filled 2018-11-26 (×17): qty 6

## 2018-11-26 MED ORDER — SODIUM CHLORIDE 0.9% FLUSH: 10.0000 mL | INTRAVENOUS | Status: DC | PRN

## 2018-11-26 MED ORDER — PRO-STAT SUGAR FREE PO LIQD
60.0000 mL | Freq: Two times a day (BID) | ORAL | Status: DC
  Administered 2018-11-26 – 2018-11-30 (×9): 60 mL

## 2018-11-26 MED ORDER — ACETAMINOPHEN 650 MG RE SUPP: 650.0000 mg | Freq: Four times a day (QID) | RECTAL | Status: DC | PRN

## 2018-11-26 MED ORDER — METHYLPREDNISOLONE SODIUM SUCC 40 MG IJ SOLR
40.0000 mg | Freq: Two times a day (BID) | INTRAMUSCULAR | Status: DC
  Administered 2018-11-26 – 2018-11-27 (×4): 40 mg via INTRAVENOUS
  Filled 2018-11-26 (×4): qty 1

## 2018-11-26 MED ORDER — SENNOSIDES-DOCUSATE SODIUM 8.6-50 MG PO TABS
1.0000 | ORAL_TABLET | Freq: Two times a day (BID) | ORAL | Status: DC
  Administered 2018-11-26 – 2018-11-28 (×5): 1
  Filled 2018-11-26 (×6): qty 1

## 2018-11-26 MED ORDER — ENOXAPARIN SODIUM 40 MG/0.4ML ~~LOC~~ SOLN
40.0000 mg | SUBCUTANEOUS | Status: DC
  Administered 2018-11-26 – 2018-12-03 (×8): 40 mg via SUBCUTANEOUS
  Filled 2018-11-26 (×8): qty 0.4

## 2018-11-26 MED ORDER — SODIUM CHLORIDE 0.9 % IV BOLUS
250.0000 mL | Freq: Once | INTRAVENOUS | Status: AC
  Administered 2018-11-26: 250 mL via INTRAVENOUS

## 2018-11-26 MED ORDER — INSULIN ASPART 100 UNIT/ML ~~LOC~~ SOLN
0.0000 [IU] | SUBCUTANEOUS | Status: DC
  Administered 2018-11-26: 2 [IU] via SUBCUTANEOUS
  Administered 2018-11-26 – 2018-11-27 (×2): 3 [IU] via SUBCUTANEOUS
  Administered 2018-11-27: 5 [IU] via SUBCUTANEOUS
  Administered 2018-11-27 (×2): 3 [IU] via SUBCUTANEOUS
  Administered 2018-11-27: 2 [IU] via SUBCUTANEOUS
  Administered 2018-11-27 – 2018-11-28 (×3): 3 [IU] via SUBCUTANEOUS
  Administered 2018-11-28: 2 [IU] via SUBCUTANEOUS
  Administered 2018-11-28 – 2018-11-29 (×6): 3 [IU] via SUBCUTANEOUS
  Filled 2018-11-26 (×17): qty 1

## 2018-11-26 MED ORDER — SODIUM CHLORIDE 0.9 % IV BOLUS
500.0000 mL | Freq: Once | INTRAVENOUS | Status: AC
  Administered 2018-11-26: 500 mL via INTRAVENOUS

## 2018-11-26 MED ORDER — ACETAMINOPHEN 325 MG PO TABS: 650.0000 mg | ORAL_TABLET | Freq: Four times a day (QID) | ORAL | Status: DC | PRN

## 2018-11-26 MED ORDER — STERILE WATER FOR INJECTION IJ SOLN
INTRAMUSCULAR | Status: AC
  Administered 2018-11-26: 12:00:00
  Filled 2018-11-26: qty 10

## 2018-11-26 MED ORDER — BUDESONIDE 0.5 MG/2ML IN SUSP
0.5000 mg | Freq: Two times a day (BID) | RESPIRATORY_TRACT | Status: DC
  Administered 2018-11-26 – 2018-12-03 (×14): 0.5 mg via RESPIRATORY_TRACT
  Filled 2018-11-26 (×14): qty 2

## 2018-11-26 MED ORDER — DEXTROSE 50 % IV SOLN
1.0000 | INTRAVENOUS | Status: AC
  Administered 2018-11-26: 50 mL via INTRAVENOUS
  Filled 2018-11-26: qty 50

## 2018-11-26 MED ORDER — VITAL HIGH PROTEIN PO LIQD
1000.0000 mL | ORAL | Status: DC
  Administered 2018-11-26 – 2018-11-29 (×3): 1000 mL

## 2018-11-26 MED ORDER — SODIUM POLYSTYRENE SULFONATE 15 GM/60ML PO SUSP
30.0000 g | Freq: Once | ORAL | Status: AC
  Administered 2018-11-26: 30 g via ORAL
  Filled 2018-11-26: qty 120

## 2018-11-26 MED ORDER — SODIUM CHLORIDE 0.9% FLUSH
10.0000 mL | Freq: Two times a day (BID) | INTRAVENOUS | Status: DC
  Administered 2018-11-26 – 2018-11-27 (×3): 10 mL
  Administered 2018-11-27 – 2018-11-28 (×2): 30 mL
  Administered 2018-11-28 – 2018-11-29 (×2): 10 mL
  Administered 2018-11-29: 30 mL
  Administered 2018-11-30: 10 mL
  Administered 2018-11-30: 20 mL
  Administered 2018-12-01 – 2018-12-03 (×5): 10 mL
  Administered 2018-12-03: 20 mL

## 2018-11-26 MED ORDER — ALBUTEROL SULFATE (2.5 MG/3ML) 0.083% IN NEBU: 2.5000 mg | INHALATION_SOLUTION | RESPIRATORY_TRACT | Status: DC

## 2018-11-26 MED ORDER — VECURONIUM BROMIDE 10 MG IV SOLR
10.0000 mg | INTRAVENOUS | Status: DC | PRN
  Administered 2018-11-26 – 2018-11-28 (×7): 10 mg via INTRAVENOUS
  Filled 2018-11-26 (×7): qty 10

## 2018-11-26 MED ORDER — SODIUM CHLORIDE 0.9 % IV SOLN
0.0000 ug/min | INTRAVENOUS | Status: DC
  Administered 2018-11-26: 60 ug/min via INTRAVENOUS
  Filled 2018-11-26: qty 40

## 2018-11-26 MED ORDER — FAMOTIDINE 20 MG PO TABS
20.0000 mg | ORAL_TABLET | Freq: Every day | ORAL | Status: DC
  Administered 2018-11-26 – 2018-12-02 (×7): 20 mg
  Filled 2018-11-26 (×7): qty 1

## 2018-11-26 MED ORDER — IPRATROPIUM-ALBUTEROL 0.5-2.5 (3) MG/3ML IN SOLN
3.0000 mL | RESPIRATORY_TRACT | Status: DC
  Administered 2018-11-26 – 2018-12-03 (×41): 3 mL via RESPIRATORY_TRACT
  Filled 2018-11-26 (×42): qty 3

## 2018-11-26 MED ORDER — PHENYLEPHRINE HCL-NACL 10-0.9 MG/250ML-% IV SOLN
0.0000 ug/min | INTRAVENOUS | Status: DC
  Administered 2018-11-26: 20 ug/min via INTRAVENOUS
  Filled 2018-11-26 (×2): qty 250

## 2018-11-26 NOTE — Progress Notes (Signed)
Initial Nutrition Assessment  DOCUMENTATION CODES:   Morbid obesity  INTERVENTION:  Initiate Vital High Protein at 50 mL/hr (1200 mL goal daily volume) + Pro-Stat 60 mL BID per OGT. Provides 1600 kcal, 165 grams of protein, 1008 mL H2O daily.  Provide liquid MVI daily per tube.  Provide minimum free water flush of 30 mL Q4hrs to maintain tube patency.  NUTRITION DIAGNOSIS:   Inadequate oral intake related to inability to eat as evidenced by NPO status.  GOAL:   Provide needs based on ASPEN/SCCM guidelines  MONITOR:   Vent status, Labs, Weight trends, TF tolerance, I & O's  REASON FOR ASSESSMENT:   Ventilator    ASSESSMENT:   52 year old female with PMHx of asthma, HTN, DM, COPD who is admitted with severe hypoxic and hypercapnic respiratory failure from COPD exacerbation requiring intubation on 12/15.   Patient intubated and sedated. On PRVC mode with FiO2 24% and PEEP 5 cmH2O. Abdomen soft today. Last BM unknown/PTA. Weight Weight appears to fluctuate between 116.1-121.1 kg over the past year. She is currently 124.2 kg (273.81 lbs). Will use weight of 121.1 kg (266.98 lbs) from 12/15 to calculate needs (BMI 40.6 kg/m2).  Enteral Access: 16 Fr. OGT placed 12/15; terminates in stomach per chest x-ray 12/15; 60 cm at corner of mouth  MAP: 58-95 mmHg  Patient is currently intubated on ventilator support Ve: 9 L/min Temp (24hrs), Avg:97.7 F (36.5 C), Min:97.4 F (36.3 C), Max:98.6 F (37 C)  Propofol: N/A  Medications reviewed and include: Colace 100 mg BID, famotidine, gabapentin, Novolog 0-15 units Q4hrs, Solu-Medrol 40 mg BID IV, azithromycin, fentanyl gtt, Versed gtt, phenylephrine gtt at 60 mcg/min.  Labs reviewed: CBG 147, Potassium 5.2, CO2 19, BUN 41, Creatinine 2.11, Phosphorus 6.1.  I/O: 810 mL UOP yesterday (0.3 mL/kg/hr)  Patient does not meet criteria for malnutrition.  Discussed with RN and on rounds. Plan is to start enteral nutrition  today.  NUTRITION - FOCUSED PHYSICAL EXAM:    Most Recent Value  Orbital Region  No depletion  Upper Arm Region  No depletion  Thoracic and Lumbar Region  No depletion  Buccal Region  Unable to assess  Temple Region  No depletion  Clavicle Bone Region  No depletion  Clavicle and Acromion Bone Region  No depletion  Scapular Bone Region  Unable to assess  Dorsal Hand  No depletion  Patellar Region  No depletion  Anterior Thigh Region  No depletion  Posterior Calf Region  No depletion  Edema (RD Assessment)  None  Hair  Reviewed  Eyes  Unable to assess  Mouth  Unable to assess  Skin  Reviewed  Nails  Reviewed     Diet Order:   Diet Order            Diet NPO time specified  Diet effective now             EDUCATION NEEDS:   No education needs have been identified at this time  Skin:  Skin Assessment: Reviewed RN Assessment  Last BM:  Unknown/PTA  Height:   Ht Readings from Last 1 Encounters:  11/25/18 5\' 8"  (1.727 m)   Weight:   Wt Readings from Last 1 Encounters:  11/26/18 124.2 kg   Ideal Body Weight:  63.6 kg  BMI:  Body mass index is 41.63 kg/m.  Estimated Nutritional Needs:   Kcal:  1366-1739 (11-14 kcal/kg)  Protein:  159 grams (2.5 grams/kg IBW)  Fluid:  1.6 L/day (25 mL/kg  IBW)  Helane Rima, MS, RD, LDN Office: [phone] Pager: [phone] After Hours/Weekend Pager: [phone]

## 2018-11-26 NOTE — Progress Notes (Signed)
[place] at [facility]                                                                                                                                                                                  Patient Demographics   Samantha [name], is a 52 y.o. female, DOB - 1966/09/20, EBX:[id number]  Admit date - 11/25/2018   Admitting Physician Harrie Foreman, MD  Outpatient Primary MD for the patient is [facility] [place]   LOS - 1  Subjective: Patient remaining intubated    Review of Systems:   CONSTITUTIONAL: On the mechanical ventilator Vitals:   Vitals:   11/26/18 1345 11/26/18 1400 11/26/18 1415 11/26/18 1430  BP: (!) 145/63 (!) 142/54 (!) 155/71 (!) 144/67  Pulse: 98 96 99 100  Resp: _0 Temp:      TempSrc:      SpO2: 97% 96% 96% 96%  Weight:      Height:        Wt Readings from Last 3 Encounters:  11/26/18 124.2 kg  09/27/18 121.1 kg  08/12/18 116.1 kg     Intake/Output Summary (Last 24 hours) at 11/26/2018 1447 Last data filed at 11/26/2018 1324 Gross per 24 hour  Intake 2643.61 ml  Output 915 ml  Net 1728.61 ml    Physical Exam:   GENERAL: Critically ill HEAD, EYES, EARS, NOSE AND THROAT: Atraumatic, normocephalic. Extraocular muscles are intact. Pupils equal and reactive to light. Sclerae anicteric. No conjunctival injection. No oro-pharyngeal erythema.  NECK: Supple. There is no jugular venous distention. No bruits, no lymphadenopathy, no thyromegaly.  HEART: Regular rate and rhythm,. No murmurs, no rubs, no clicks.  LUNGS: Diminished breath sounds bilaterally aBDOMEN: Soft, flat, nontender, nondistended. Has good bowel sounds. No hepatosplenomegaly appreciated.  EXTREMITIES: No evidence of any cyanosis, clubbing, or peripheral edema.  +2 pedal and radial pulses bilaterally.  NEUROLOGIC: Sedated  sKIN: Moist and warm with no rashes appreciated.  Psych: Not anxious, depressed LN: No inguinal LN enlargement     Antibiotics   Anti-infectives (From admission, onward)   Start     Dose/Rate Route Frequency Ordered Stop   11/26/18 1000  azithromycin (ZITHROMAX) 500 mg in sodium chloride 0.9 % 250 mL IVPB     500 mg 250 mL/hr over 60 Minutes Intravenous Every 24 hours 11/25/18 1406 11/30/18 0959   11/25/18 0215  azithromycin (ZITHROMAX) 500 mg in sodium chloride 0.9 % 250 mL IVPB     500 mg 250 mL/hr over 60 Minutes Intravenous  Once 11/25/18 0212 11/25/18 0957      Medications   Scheduled Meds: . budesonide (PULMICORT) nebulizer solution  0.5 mg  Nebulization BID  . chlorhexidine gluconate (MEDLINE KIT)  15 mL Mouth Rinse BID  . docusate  100 mg Per Tube BID  . enoxaparin (LOVENOX) injection  40 mg Subcutaneous Q24H  . famotidine  20 mg Per Tube Daily  . feeding supplement (PRO-STAT SUGAR FREE 64)  60 mL Per Tube BID  . gabapentin  300 mg Per Tube Q12H  . insulin aspart  0-15 Units Subcutaneous Q4H  . ipratropium-albuterol  3 mL Nebulization Q4H  . mouth rinse  15 mL Mouth Rinse 10 times per day  . methylPREDNISolone (SOLU-MEDROL) injection  40 mg Intravenous BID  . multivitamin  15 mL Per Tube Daily  . sodium chloride flush  10-40 mL Intracatheter Q12H   Continuous Infusions: . azithromycin Stopped (11/26/18 1118)  . feeding supplement (VITAL HIGH PROTEIN)    . fentaNYL infusion INTRAVENOUS 300 mcg/hr (11/26/18 1300)  . midazolam (VERSED) infusion 2 mg/hr (11/26/18 1300)  . phenylephrine (NEO-SYNEPHRINE) Adult infusion 55 mcg/min (11/26/18 1430)   PRN Meds:.acetaminophen **OR** acetaminophen, sodium chloride flush, vecuronium   Data Review:   Micro Results Recent Results (from the past 240 hour(s))  MRSA PCR Screening     Status: None   Collection Time: 11/25/18  8:43 AM  Result Value Ref Range Status   MRSA by PCR NEGATIVE NEGATIVE Final    Comment:        The GeneXpert MRSA Assay (FDA approved for NASAL specimens only), is one component of a comprehensive MRSA  colonization surveillance program. It is not intended to diagnose MRSA infection nor to guide or monitor treatment for MRSA infections. Performed at [facility], [address] [place], [place] [address]     Radiology Reports Dg Chest [place] 1 View  Result Date: 11/26/2018 CLINICAL DATA:  Hypoxia EXAM: PORTABLE CHEST 1 VIEW COMPARISON:  November 25, 2018 FINDINGS: Endotracheal tube tip is 6.7 cm above the carina. Nasogastric tube tip and side port are below the diaphragm. No pneumothorax. There is mild bibasilar atelectasis. Lungs elsewhere are clear. Heart size and pulmonary vascularity are normal. No adenopathy. No bone lesions. IMPRESSION: Tube positions as described without evident pneumothorax. Mild bibasilar atelectasis. Lungs elsewhere clear. Stable cardiac silhouette. Electronically Signed   By: Lowella Grip III M.D.   On: 11/26/2018 07:12   Dg Chest Port 1 View  Result Date: 11/25/2018 CLINICAL DATA:  Status post ET tube and OG placement. EXAM: PORTABLE CHEST 1 VIEW COMPARISON:  Portable film earlier in the day. FINDINGS: Normal heart size. Clear lung fields. ET tube 4 cm above carina, satisfactory position. Orogastric tube tip lies somewhere in the stomach. Stable appearance from priors. IMPRESSION: Satisfactory ET tube placement. No active cardiopulmonary disease. Electronically Signed   By: Staci Righter M.D.   On: 11/25/2018 07:06   Dg Chest Port 1 View  Result Date: 11/25/2018 CLINICAL DATA:  Shortness of breath for 1 week. EXAM: PORTABLE CHEST 1 VIEW COMPARISON:  Radiograph 10/04/2018 FINDINGS: The cardiomediastinal contours are normal. Chronic hyperinflation and bronchial thickening. Pulmonary vasculature is normal. No consolidation, pleural effusion, or pneumothorax. No acute osseous abnormalities are seen. IMPRESSION: Chronic hyperinflation and bronchial thickening. No acute findings. Electronically Signed   By: Keith Rake M.D.   On: 11/25/2018 02:59    [place] Ekg Site Rite  Result Date: 11/26/2018 If Site Rite image not attached, placement could not be confirmed due to current cardiac rhythm.    CBC Recent Labs  Lab 11/25/18 0217 11/26/18 0444  WBC 10.0 11.6*  HGB  14.3 12.7  HCT 43.9 41.3  PLT 353 293  MCV 89.8 95.6  MCH 29.2 29.4  MCHC 32.6 30.8  RDW 13.6 13.9  LYMPHSABS 4.5*  --   MONOABS 0.7  --   EOSABS 0.3  --   BASOSABS 0.0  --     Chemistries  Recent Labs  Lab 11/25/18 0217 11/26/18 0444  NA 139 135  K 4.3 5.2*  CL 108 106  CO2 24 19*  GLUCOSE 149* 135*  BUN 27* 41*  CREATININE 1.49* 2.11*  CALCIUM 9.1 8.5*  MG  --  2.4  AST 26 28  ALT 43 34  ALKPHOS 80 60  BILITOT 0.4 0.9   ------------------------------------------------------------------------------------------------------------------ estimated creatinine clearance is 42.8 mL/min (A) (by C-G formula based on SCr of 2.11 mg/dL (H)). ------------------------------------------------------------------------------------------------------------------ No results for input(s): HGBA1C in the last 72 hours. ------------------------------------------------------------------------------------------------------------------ No results for input(s): CHOL, HDL, LDLCALC, TRIG, CHOLHDL, LDLDIRECT in the last 72 hours. ------------------------------------------------------------------------------------------------------------------ Recent Labs    11/25/18 0217  TSH 1.497   ------------------------------------------------------------------------------------------------------------------ No results for input(s): VITAMINB12, FOLATE, FERRITIN, TIBC, IRON, RETICCTPCT in the last 72 hours.  Coagulation profile No results for input(s): INR, PROTIME in the last 168 hours.  No results for input(s): DDIMER in the last 72 hours.  Cardiac Enzymes Recent Labs  Lab 11/25/18 0217  TROPONINI <0.03    ------------------------------------------------------------------------------------------------------------------ Invalid input(s): POCBNP    Assessment & Plan   This is a 52 year old female admitted for respiratory failure. 1.  Respiratory failure: Acute; with hypoxemia.  The patient has failed BiPAP and is now intubated.  Continue supplemental oxygen and albuterol as needed. 2.  COPD: With exacerbation;  continue IV Solu-Medrol continue nebs as well as azithromycin 3.  Hypertension: Controlled; continue losartan and hydrochlorothiazide  Antihypertensive medications IV as needed while intubated. 4.  Diabetes mellitus type 2: Sliding scale insulin while hospitalized.  Hold metformin.  Gabapentin for neuropathy 5.  DVT prophylaxis: Lovenox 6.  GI prophylaxis: PPI after 24 hours intubation      Code Status Orders  (From admission, onward)         Start     Ordered   11/25/18 0838  Full code  Continuous     11/25/18 0837        Code Status History    Date Active Date Inactive Code Status Order ID Comments User Context   07/30/2018 1601 08/01/2018 1453 Full Code [id number]  Demetrios Loll, MD ED   02/21/2018 1126 02/23/2018 0138 Full Code [id number]  Henreitta Leber, MD Inpatient   10/06/2015 0407 10/08/2015 1307 Full Code [id number]  Harrie Foreman, MD Inpatient   08/30/2015 1119 08/31/2015 1811 Full Code [id number]  Fritzi Mandes, MD Inpatient           Consults pulmonary critical care  DVT Prophylaxis  Lovenox  Lab Results  Component Value Date   PLT 293 11/26/2018     Time Spent in minutes   12mn  Greater than 50% of time spent in care coordination and counseling patient regarding the condition and plan of care.   SDustin FlockM.D on 11/26/2018 at 2:47 PM  Between 7am to 6pm - Pager - [phone]  After 6pm go to [email or web address] - [employer]   Office  [phone]

## 2018-11-26 NOTE — Progress Notes (Signed)
Pharmacy Electrolyte Monitoring Consult:  Pharmacy consulted to assist in monitoring and replacing electrolytes in this 52 y.o. female admitted on 11/25/2018 with COPD exacerbation.   Patient's sedation regimen currently includes fentanyl and midazolam infusions.   Labs:  Sodium (mmol/L)  Date Value  11/26/2018 135  03/10/2015 140   Potassium (mmol/L)  Date Value  11/26/2018 5.2 (H)  03/10/2015 3.8   Magnesium (mg/dL)  Date Value  16/10/960412/16/2019 2.4  06/29/2014 1.9   Phosphorus (mg/dL)  Date Value  54/09/811912/16/2019 6.1 (H)   Calcium (mg/dL)  Date Value  14/78/295612/16/2019 8.5 (L)   Calcium, Total (mg/dL)  Date Value  21/30/865703/29/2016 9.0   Albumin (g/dL)  Date Value  84/69/629512/16/2019 3.7  12/07/2014 3.3 (L)    Assessment/Plan: 1. Electrolytes: potassium is elevated. Per AM ICU rounds, will recheck potassium at 1600. Will obtain all electrolytes with am labs.   2. Constipation: will start senna/docusate 1 tab VT BID.   3. Glucose: initiate SSI Q4hr.   Pharmacy will continue to monitor and adjust per consult.   Izaiyah Kleinman L 11/26/2018 3:58 PM

## 2018-11-26 NOTE — Progress Notes (Signed)
Dr. Belia HemanKasa at bedside talking with patient's daughter and family updating them on patient's diagnosis and plan of care.  MD explaining care and prognosis.  Propofol stopped at 1130 per MD order and since then vecuronium has been given for vent dyssynchrony.  Dr. Belia HemanKasa and Peyton NajjarLarry, RRT making adjustments to ventilator. Titrating neo drip to maintain BP.

## 2018-11-26 NOTE — Progress Notes (Signed)
CRITICAL CARE NOTE  CC  follow up respiratory failure  SUBJECTIVE Patient remains critically ill Prognosis is guarded On full vent support     SIGNIFICANT EVENTS 12/15 intubated, failed biPAP 12/16 remains on vent   BP (!) 150/86   Pulse (!) 49   Temp (!) 97.4 F (36.3 C) (Oral)   Resp 17   Ht _0  (1.727 m)   Wt 124.2 kg   SpO2 99%   BMI 41.63 kg/m    REVIEW OF SYSTEMS  PATIENT IS UNABLE TO PROVIDE COMPLETE REVIEW OF SYSTEMS DUE TO SEVERE CRITICAL ILLNESS   PHYSICAL EXAMINATION:  GENERAL:critically ill appearing, +resp distress HEAD: Normocephalic, atraumatic.  EYES: Pupils equal, round, reactive to light.  No scleral icterus.  MOUTH: Moist mucosal membrane. NECK: Supple. No thyromegaly. No nodules. No JVD.  PULMONARY: +rhonchi, +wheezing CARDIOVASCULAR: S1 and S2. Regular rate and rhythm. No murmurs, rubs, or gallops.  GASTROINTESTINAL: Soft, nontender, -distended. No masses. Positive bowel sounds. No hepatosplenomegaly.  MUSCULOSKELETAL: No swelling, clubbing, or edema.  NEUROLOGIC: obtunded, GCS<8 SKIN:intact,warm,dry      Indwelling Urinary Catheter continued, requirement due to   Reason to continue Indwelling Urinary Catheter for strict Intake/Output monitoring for hemodynamic instability         Ventilator continued, requirement due to, resp failure    Ventilator Sedation RASS 0 to -2     ASSESSMENT AND PLAN SYNOPSIS   Severe Hypoxic and Hypercapnic Respiratory Failure from COPD exacerbation -continue Full MV support -continue Bronchodilator Therapy -Wean Fio2 and PEEP as tolerated -will perform SAT/SBT when respiratory parameters are met    Renal Failure-most likely due to ATN -follow chem 7 -follow UO -continue Foley Catheter-assess need daily   NEUROLOGY - intubated and sedated - minimal sedation to achieve a RASS goal: -1 Wake up assessment pending   Shock-from hypovolumia IVF's Pressors if needed  CARDIAC ICU  monitoring  ID -continue IV abx as prescibed -follow up cultures  GI/Nutrition GI PROPHYLAXIS as indicated DIET-->TF's as tolerated Constipation protocol as indicated  ENDO - ICU hypoglycemic\Hyperglycemia protocol -check FSBS per protocol   ELECTROLYTES -follow labs as needed -replace as needed -pharmacy consultation and following   DVT/GI PRX ordered TRANSFUSIONS AS NEEDED MONITOR FSBS ASSESS the need for LABS as needed   Critical Care Time devoted to patient care services described in this note is 37 minutes.   Overall, patient is critically ill, prognosis is guarded.  Patient with Multiorgan failure and at high risk for cardiac arrest and death.    Corrin Parker, M.D.  [facility]  Medical Director [place] [facility]

## 2018-11-26 NOTE — Progress Notes (Signed)
Called Dr. Belia HemanKasa and made MD aware that repeat potassium check is 7.3, MD gave orders for kayexalate, amp d50, 10 units IV insulin, IV lasix 40 mg and 250 cc NS bolus.

## 2018-11-26 NOTE — Progress Notes (Signed)
Peripherally Inserted Central Catheter/Midline Placement  The IV Nurse has discussed with the patient and/or persons authorized to consent for the patient, the purpose of this procedure and the potential benefits and risks involved with this procedure.  The benefits include less needle sticks, lab draws from the catheter, and the patient may be discharged home with the catheter. Risks include, but not limited to, infection, bleeding, blood clot (thrombus formation), and puncture of an artery; nerve damage and irregular heartbeat and possibility to perform a PICC exchange if needed/ordered by physician.  Alternatives to this procedure were also discussed.  Bard Power PICC patient education guide, fact sheet on infection prevention and patient information card has been provided to patient /or left at bedside.    PICC/Midline Placement Documentation  PICC Double Lumen 11/26/18 PICC Right Basilic 37 cm 0 cm (Active)  Indication for Insertion or Continuance of Line Prolonged intravenous therapies 11/26/2018 11:16 AM  Exposed Catheter (cm) 0 cm 11/26/2018 11:16 AM  Site Assessment Clean;Dry;Intact 11/26/2018 11:16 AM  Lumen #1 Status Flushed;Blood return noted;Saline locked 11/26/2018 11:16 AM  Lumen #2 Status Flushed;Blood return noted;Saline locked 11/26/2018 11:16 AM  Dressing Type Transparent;Securing device 11/26/2018 11:16 AM  Dressing Status Clean;Dry;Intact;Antimicrobial disc in place 11/26/2018 11:16 AM  Dressing Change Due 12/03/18 11/26/2018 11:16 AM       Romie JumperAlford, Jessicaann Overbaugh Terry 11/26/2018, 11:20 AM

## 2018-11-26 NOTE — Progress Notes (Signed)
Spoke with Dr. Belia HemanKasa and made MD aware that potassium is 7.2 and MD gave order to recheck lab with a complete chemistry panel.

## 2018-11-27 LAB — GLUCOSE, CAPILLARY
Glucose-Capillary: 134 mg/dL — ABNORMAL HIGH (ref 70–99)
Glucose-Capillary: 153 mg/dL — ABNORMAL HIGH (ref 70–99)
Glucose-Capillary: 173 mg/dL — ABNORMAL HIGH (ref 70–99)
Glucose-Capillary: 185 mg/dL — ABNORMAL HIGH (ref 70–99)
Glucose-Capillary: 187 mg/dL — ABNORMAL HIGH (ref 70–99)
Glucose-Capillary: 193 mg/dL — ABNORMAL HIGH (ref 70–99)
Glucose-Capillary: 208 mg/dL — ABNORMAL HIGH (ref 70–99)

## 2018-11-27 LAB — BLOOD GAS, ARTERIAL
Acid-base deficit: 1.8 mmol/L (ref 0.0–2.0)
Bicarbonate: 28.3 mmol/L — ABNORMAL HIGH (ref 20.0–28.0)
FIO2: 50
MECHVT: 500 mL
Mechanical Rate: 18
O2 Saturation: 97.3 %
PCO2 ART: 74 mmHg — AB (ref 32.0–48.0)
PEEP: 10 cmH2O
PH ART: 7.19 — AB (ref 7.350–7.450)
PO2 ART: 114 mmHg — AB (ref 83.0–108.0)
Patient temperature: 37

## 2018-11-27 LAB — BASIC METABOLIC PANEL
Anion gap: 6 (ref 5–15)
Anion gap: 6 (ref 5–15)
BUN: 50 mg/dL — ABNORMAL HIGH (ref 6–20)
BUN: 45 mg/dL — AB (ref 6–20)
CO2: 22 mmol/L (ref 22–32)
CO2: 25 mmol/L (ref 22–32)
Calcium: 8.1 mg/dL — ABNORMAL LOW (ref 8.9–10.3)
Calcium: 8.2 mg/dL — ABNORMAL LOW (ref 8.9–10.3)
Chloride: 109 mmol/L (ref 98–111)
Chloride: 110 mmol/L (ref 98–111)
Creatinine, Ser: 2.22 mg/dL — ABNORMAL HIGH (ref 0.44–1.00)
Creatinine, Ser: 2.01 mg/dL — ABNORMAL HIGH (ref 0.44–1.00)
GFR calc Af Amer: 28 mL/min — ABNORMAL LOW (ref >60)
GFR calc Af Amer: 32 mL/min — ABNORMAL LOW (ref >60)
GFR calc non Af Amer: 25 mL/min — ABNORMAL LOW (ref >60)
GFR calc non Af Amer: 28 mL/min — ABNORMAL LOW (ref >60)
Glucose, Bld: 189 mg/dL — ABNORMAL HIGH (ref 70–99)
Glucose, Bld: 169 mg/dL — ABNORMAL HIGH (ref 70–99)
POTASSIUM: 5.8 mmol/L — AB (ref 3.5–5.1)
Potassium: 6.2 mmol/L — ABNORMAL HIGH (ref 3.5–5.1)
Sodium: 137 mmol/L (ref 135–145)
Sodium: 141 mmol/L (ref 135–145)

## 2018-11-27 LAB — POTASSIUM: Potassium: 5 mmol/L (ref 3.5–5.1)

## 2018-11-27 LAB — MAGNESIUM: Magnesium: 2.6 mg/dL — ABNORMAL HIGH (ref 1.7–2.4)

## 2018-11-27 LAB — PHOSPHORUS: Phosphorus: 5.5 mg/dL — ABNORMAL HIGH (ref 2.5–4.6)

## 2018-11-27 MED ORDER — QUETIAPINE FUMARATE 25 MG PO TABS
25.0000 mg | ORAL_TABLET | Freq: Once | ORAL | Status: AC
  Administered 2018-11-27: 25 mg via ORAL
  Filled 2018-11-27: qty 1

## 2018-11-27 MED ORDER — INSULIN ASPART 100 UNIT/ML IV SOLN
10.0000 [IU] | Freq: Once | INTRAVENOUS | Status: AC
  Administered 2018-11-27: 10 [IU] via INTRAVENOUS
  Filled 2018-11-27: qty 0.1

## 2018-11-27 MED ORDER — DEXTROSE 50 % IV SOLN
1.0000 | Freq: Once | INTRAVENOUS | Status: AC
  Administered 2018-11-27: 50 mL via INTRAVENOUS
  Filled 2018-11-27: qty 50

## 2018-11-27 MED ORDER — FUROSEMIDE 10 MG/ML IJ SOLN
40.0000 mg | Freq: Once | INTRAMUSCULAR | Status: AC
  Administered 2018-11-27: 40 mg via INTRAVENOUS
  Filled 2018-11-27: qty 4

## 2018-11-27 MED ORDER — QUETIAPINE FUMARATE 25 MG PO TABS
50.0000 mg | ORAL_TABLET | Freq: Every day | ORAL | Status: DC
  Administered 2018-11-27: 50 mg via ORAL
  Filled 2018-11-27: qty 2

## 2018-11-27 MED ORDER — SODIUM POLYSTYRENE SULFONATE 15 GM/60ML PO SUSP
30.0000 g | Freq: Once | ORAL | Status: AC
  Administered 2018-11-27: 30 g
  Filled 2018-11-27: qty 120

## 2018-11-27 MED ORDER — SODIUM CHLORIDE 0.9 % IV BOLUS
500.0000 mL | Freq: Once | INTRAVENOUS | Status: AC
  Administered 2018-11-27: 500 mL via INTRAVENOUS

## 2018-11-27 MED ORDER — SODIUM BICARBONATE 8.4 % IV SOLN
100.0000 meq | Freq: Once | INTRAVENOUS | Status: AC
  Administered 2018-11-27: 100 meq via INTRAVENOUS
  Filled 2018-11-27: qty 50

## 2018-11-27 NOTE — Progress Notes (Signed)
Pharmacy Electrolyte Monitoring Consult:  Pharmacy consulted to assist in monitoring and replacing electrolytes in this 52 y.o. female admitted on 11/25/2018 with COPD exacerbation.   Patient's sedation regimen currently includes fentanyl and midazolam infusions.   Labs:  Sodium (mmol/L)  Date Value  11/27/2018 141  03/10/2015 140   Potassium (mmol/L)  Date Value  11/27/2018 5.0  03/10/2015 3.8   Magnesium (mg/dL)  Date Value  16/10/960412/17/2019 2.6 (H)  06/29/2014 1.9   Phosphorus (mg/dL)  Date Value  54/09/811912/17/2019 5.5 (H)   Calcium (mg/dL)  Date Value  14/78/295612/17/2019 8.2 (L)   Calcium, Total (mg/dL)  Date Value  21/30/865703/29/2016 9.0   Albumin (g/dL)  Date Value  84/69/629512/16/2019 3.7  12/07/2014 3.3 (L)    Assessment/Plan: 1. Electrolytes: Per AM ICU rounds, SPS x 1. Potassium now 5. Will obtain all electrolytes with am labs.   2. Constipation: will continue senna/docusate 1 tab VT BID. Patient will bowel movement after SPS. Will continue to monitor.   3. Glucose: initiate SSI Q4hr.   Pharmacy will continue to monitor and adjust per consult.   Berdena Cisek L 11/27/2018 4:09 PM

## 2018-11-27 NOTE — Progress Notes (Signed)
[facility]                                                                                                                                                                                  Patient Demographics   Samantha [name], is a 52 y.o. female, DOB - 1966/05/09, MWN:[id number]  Admit date - 11/25/2018   Admitting Physician Harrie Foreman, MD  Outpatient Primary MD for the patient is [facility] [place]   LOS - 2  Subjective: Remains intubated, patient not meeting weaning parameters yet   Review of Systems:   CONSTITUTIONAL: On the mechanical ventilator Vitals:   Vitals:   11/27/18 0900 11/27/18 1000 11/27/18 1200 11/27/18 1300  BP: 103/68 104/71 113/70   Pulse: 95 92 96 92  Resp: (!) 24 (!) 24 (!) 24 (!) 24  Temp:   98 F (36.7 C)   TempSrc:   Axillary   SpO2: 98% 97% 95% 96%  Weight:      Height:        Wt Readings from Last 3 Encounters:  11/27/18 124.4 kg  09/27/18 121.1 kg  08/12/18 116.1 kg     Intake/Output Summary (Last 24 hours) at 11/27/2018 1417 Last data filed at 11/27/2018 1302 Gross per 24 hour  Intake 1975.16 ml  Output 3925 ml  Net -1949.84 ml    Physical Exam:   GENERAL: Critically ill HEAD, EYES, EARS, NOSE AND THROAT: Atraumatic, normocephalic. Extraocular muscles are intact. Pupils equal and reactive to light. Sclerae anicteric. No conjunctival injection. No oro-pharyngeal erythema.  NECK: Supple. There is no jugular venous distention. No bruits, no lymphadenopathy, no thyromegaly.  HEART: Regular rate and rhythm,. No murmurs, no rubs, no clicks.  LUNGS: Wheezing throughout both lungs ABDOMEN: Soft, flat, nontender, nondistended. Has good bowel sounds. No hepatosplenomegaly appreciated.  EXTREMITIES: No evidence of any cyanosis, clubbing, or peripheral edema.  +2 pedal and radial pulses bilaterally.  NEUROLOGIC: Sedated  sKIN: Moist and warm with no rashes appreciated.  Psych: Not anxious,  depressed LN: No inguinal LN enlargement    Antibiotics   Anti-infectives (From admission, onward)   Start     Dose/Rate Route Frequency Ordered Stop   11/26/18 1000  azithromycin (ZITHROMAX) 500 mg in sodium chloride 0.9 % 250 mL IVPB     500 mg 250 mL/hr over 60 Minutes Intravenous Every 24 hours 11/25/18 1406 11/30/18 0959   11/25/18 0215  azithromycin (ZITHROMAX) 500 mg in sodium chloride 0.9 % 250 mL IVPB     500 mg 250 mL/hr over 60 Minutes Intravenous  Once 11/25/18 0212 11/25/18 0957      Medications   Scheduled Meds: .  budesonide (PULMICORT) nebulizer solution  0.5 mg Nebulization BID  . chlorhexidine gluconate (MEDLINE KIT)  15 mL Mouth Rinse BID  . enoxaparin (LOVENOX) injection  40 mg Subcutaneous Q24H  . famotidine  20 mg Per Tube Daily  . feeding supplement (PRO-STAT SUGAR FREE 64)  60 mL Per Tube BID  . gabapentin  300 mg Per Tube Q12H  . insulin aspart  0-15 Units Subcutaneous Q4H  . ipratropium-albuterol  3 mL Nebulization Q4H  . mouth rinse  15 mL Mouth Rinse 10 times per day  . methylPREDNISolone (SOLU-MEDROL) injection  40 mg Intravenous BID  . multivitamin  15 mL Per Tube Daily  . QUEtiapine  50 mg Oral QHS  . senna-docusate  1 tablet Per Tube BID  . sodium chloride flush  10-40 mL Intracatheter Q12H   Continuous Infusions: . azithromycin Stopped (11/27/18 1203)  . feeding supplement (VITAL HIGH PROTEIN) 1,000 mL (11/26/18 1545)  . fentaNYL infusion INTRAVENOUS 350 mcg/hr (11/27/18 1302)  . midazolam (VERSED) infusion 2 mg/hr (11/27/18 1302)  . phenylephrine (NEO-SYNEPHRINE) Adult infusion Stopped (11/27/18 0200)   PRN Meds:.acetaminophen **OR** acetaminophen, sodium chloride flush, vecuronium   Data Review:   Micro Results Recent Results (from the past 240 hour(s))  MRSA PCR Screening     Status: None   Collection Time: 11/25/18  8:43 AM  Result Value Ref Range Status   MRSA by PCR NEGATIVE NEGATIVE Final    Comment:        The GeneXpert  MRSA Assay (FDA approved for NASAL specimens only), is one component of a comprehensive MRSA colonization surveillance program. It is not intended to diagnose MRSA infection nor to guide or monitor treatment for MRSA infections. Performed at [facility], [address], [place], [place] [address]     Radiology Reports Dg Chest [place] 1 View  Result Date: 11/26/2018 CLINICAL DATA:  Hypoxia EXAM: PORTABLE CHEST 1 VIEW COMPARISON:  November 25, 2018 FINDINGS: Endotracheal tube tip is 6.7 cm above the carina. Nasogastric tube tip and side port are below the diaphragm. No pneumothorax. There is mild bibasilar atelectasis. Lungs elsewhere are clear. Heart size and pulmonary vascularity are normal. No adenopathy. No bone lesions. IMPRESSION: Tube positions as described without evident pneumothorax. Mild bibasilar atelectasis. Lungs elsewhere clear. Stable cardiac silhouette. Electronically Signed   By: Lowella Grip III M.D.   On: 11/26/2018 07:12   Dg Chest Port 1 View  Result Date: 11/25/2018 CLINICAL DATA:  Status post ET tube and OG placement. EXAM: PORTABLE CHEST 1 VIEW COMPARISON:  Portable film earlier in the day. FINDINGS: Normal heart size. Clear lung fields. ET tube 4 cm above carina, satisfactory position. Orogastric tube tip lies somewhere in the stomach. Stable appearance from priors. IMPRESSION: Satisfactory ET tube placement. No active cardiopulmonary disease. Electronically Signed   By: Staci Righter M.D.   On: 11/25/2018 07:06   Dg Chest Port 1 View  Result Date: 11/25/2018 CLINICAL DATA:  Shortness of breath for 1 week. EXAM: PORTABLE CHEST 1 VIEW COMPARISON:  Radiograph 10/04/2018 FINDINGS: The cardiomediastinal contours are normal. Chronic hyperinflation and bronchial thickening. Pulmonary vasculature is normal. No consolidation, pleural effusion, or pneumothorax. No acute osseous abnormalities are seen. IMPRESSION: Chronic hyperinflation and bronchial thickening.  No acute findings. Electronically Signed   By: Keith Rake M.D.   On: 11/25/2018 02:59   [place] Ekg Site Rite  Result Date: 11/26/2018 If Site Rite image not attached, placement could not be confirmed due to current cardiac rhythm.  CBC Recent Labs  Lab 11/25/18 0217 11/26/18 0444  WBC 10.0 11.6*  HGB 14.3 12.7  HCT 43.9 41.3  PLT 353 293  MCV 89.8 95.6  MCH 29.2 29.4  MCHC 32.6 30.8  RDW 13.6 13.9  LYMPHSABS 4.5*  --   MONOABS 0.7  --   EOSABS 0.3  --   BASOSABS 0.0  --     Chemistries  Recent Labs  Lab 11/25/18 0217 11/26/18 0444 11/26/18 1700 11/26/18 1814 11/27/18 0024 11/27/18 0503 11/27/18 1154  NA 139 135  --  136 137 141  --   K 4.3 5.2* 7.2* 7.3* 6.2* 5.8* 5.0  CL 108 106  --  108 109 110  --   CO2 24 19*  --  '22 22 25  ' --   GLUCOSE 149* 135*  --  171* 189* 169*  --   BUN 27* 41*  --  42* 45* 50*  --   CREATININE 1.49* 2.11*  --  2.14* 2.22* 2.01*  --   CALCIUM 9.1 8.5*  --  8.4* 8.1* 8.2*  --   MG  --  2.4  --   --   --  2.6*  --   AST 26 28  --   --   --   --   --   ALT 43 34  --   --   --   --   --   ALKPHOS 80 60  --   --   --   --   --   BILITOT 0.4 0.9  --   --   --   --   --    ------------------------------------------------------------------------------------------------------------------ estimated creatinine clearance is 45 mL/min (A) (by C-G formula based on SCr of 2.01 mg/dL (H)). ------------------------------------------------------------------------------------------------------------------ No results for input(s): HGBA1C in the last 72 hours. ------------------------------------------------------------------------------------------------------------------ No results for input(s): CHOL, HDL, LDLCALC, TRIG, CHOLHDL, LDLDIRECT in the last 72 hours. ------------------------------------------------------------------------------------------------------------------ Recent Labs    11/25/18 0217  TSH 1.497    ------------------------------------------------------------------------------------------------------------------ No results for input(s): VITAMINB12, FOLATE, FERRITIN, TIBC, IRON, RETICCTPCT in the last 72 hours.  Coagulation profile No results for input(s): INR, PROTIME in the last 168 hours.  No results for input(s): DDIMER in the last 72 hours.  Cardiac Enzymes Recent Labs  Lab 11/25/18 0217  TROPONINI <0.03   ------------------------------------------------------------------------------------------------------------------ Invalid input(s): POCBNP    Assessment & Plan   This is a 52 year old female admitted for respiratory failure. 1.  Respiratory failure: Acute; with hypoxemia.  The patient has failed BiPAP and is now intubated.  Continue supplemental oxygen and albuterol as needed. 2.  COPD: With exacerbation;  continue IV Solu-Medrol continue nebs as well as azithromycin 3.  Hypertension: Controlled; off blood pressure medications 4.  Diabetes mellitus type 2: Sliding scale insulin while hospitalized.  Hold metformin.  Gabapentin for neuropathy 5.  DVT prophylaxis: Lovenox 6.  GI prophylaxis: Continue Pepcid      Code Status Orders  (From admission, onward)         Start     Ordered   11/25/18 0838  Full code  Continuous     11/25/18 0837        Code Status History    Date Active Date Inactive Code Status Order ID Comments User Context   07/30/2018 1601 08/01/2018 1453 Full Code [id number]  Demetrios Loll, MD ED   02/21/2018 1126 02/23/2018 0138 Full Code [id number]  Henreitta Leber, MD Inpatient   10/06/2015 0407 10/08/2015 1307 Full Code [id number]  Harrie Foreman, MD Inpatient   08/30/2015 1119 08/31/2015 1811 Full Code [id number]  Fritzi Mandes, MD Inpatient           Consults pulmonary critical care  DVT Prophylaxis  Lovenox  Lab Results  Component Value Date   PLT 293 11/26/2018     Time Spent in minutes   60mn  Greater than 50% of time spent  in care coordination and counseling patient regarding the condition and plan of care.   SDustin FlockM.D on 11/27/2018 at 2:17 PM  Between 7am to 6pm - Pager - [phone]  After 6pm go to [email or web address] - pProofreader [employer]   Office  [phone]

## 2018-11-27 NOTE — Progress Notes (Signed)
CRITICAL CARE NOTE  CC  Severe resp failure  SUBJECTIVE Remains critically ill Unable to wean from vent today Severe wheezing, increased WOB Full vent support      SIGNIFICANT EVENTS 12/15 intubated, failed biPAP 12/16 remains on vent 12/17 on vent, wheezing   BP 104/71   Pulse 92   Temp 98.2 F (36.8 C) (Axillary)   Resp (!) 24   Ht '5\' 8"'  (1.727 m)   Wt 124.4 kg   SpO2 97%   BMI 41.70 kg/m    REVIEW OF SYSTEMS  PATIENT IS UNABLE TO PROVIDE COMPLETE REVIEW OF SYSTEMS DUE TO SEVERE CRITICAL ILLNESS   PHYSICAL EXAMINATION:  GENERAL:critically ill appearing, +resp distress HEAD: Normocephalic, atraumatic.  EYES: Pupils equal, round, reactive to light.  No scleral icterus.  MOUTH: Moist mucosal membrane. NECK: Supple. No thyromegaly. No nodules. No JVD.  PULMONARY: +rhonchi, +wheezing CARDIOVASCULAR: S1 and S2. Regular rate and rhythm. No murmurs, rubs, or gallops.  GASTROINTESTINAL: Soft, nontender, -distended. No masses. Positive bowel sounds. No hepatosplenomegaly.  MUSCULOSKELETAL: No swelling, clubbing, or edema.  NEUROLOGIC: obtunded SKIN:intact,warm,dry       Indwelling Urinary Catheter continued, requirement due to   Reason to continue Indwelling Urinary Catheter for strict Intake/Output monitoring for hemodynamic instability         Ventilator continued, requirement due to, resp failure    Ventilator Sedation RASS 0 to -2     ASSESSMENT AND PLAN SYNOPSIS  SEVERE COPD EXACERBATION -continue IV steroids as prescribed -continue NEB THERAPY as prescribed -morphine as needed -wean fio2 as needed and tolerated    Severe Hypoxic and Hypercapnic Respiratory Failure -continue Full MV support -continue Bronchodilator Therapy -Wean Fio2 and PEEP as tolerated -will perform SAT/SBTTwhen respiratory parameters are met  Renal Failure-most likely due to ATN -follow chem 7 -follow UO -continue Foley Catheter-assess need   NEUROLOGY -  intubated and sedated - minimal sedation to achieve a RASS goal: -1    GI/Nutrition GI PROPHYLAXIS as indicated DIET-->TF's as tolerated Constipation protocol as indicated  ELECTROLYTES -follow labs as needed -replace as needed -pharmacy consultation and following   DVT/GI PRX ordered TRANSFUSIONS AS NEEDED MONITOR FSBS ASSESS the need for LABS as needed     Critical Care Time devoted to patient care services described in this note is 36 minutes.   Overall, patient is critically ill, prognosis is guarded.  Patient with Multiorgan failure and at high risk for cardiac arrest and death.    Corrin Parker, M.D.  [facility]  Medical Director [place] Director [facility] Cardio-Pulmonary Department

## 2018-11-27 NOTE — Care Management Note (Signed)
Case Management Note  Patient Details  Name: Samantha [name] MRN: [id number] Date of Birth: Mar 07, 1966  Subjective/Objective:  Patient is admitted with acute respiratory failure with hypoxemia requiring intubation.  Patient is currently in the ICU intubated and sedated, family is at the bedside.  Patient is from home in [place] and lives with one of her daughter's, she has 2.  Daughter at the bedside reports that the patient is very independent at home.  Patient has a history of COPD and does have a home nebulizer.  Patient does not drive but family and friends are available for transportation.  RNCM will cont to follow. Robbie LisJeanna Andrei Mccook RN BSN [id number]              Action/Plan:   Expected Discharge Date:                  Expected Discharge Plan:     In-House Referral:     Discharge planning Services     Post Acute Care Choice:    Choice offered to:     DME Arranged:    DME Agency:     HH Arranged:    HH Agency:     Status of Service:     If discussed at Long Length of Stay Meetings, dates discussed:    Additional Comments:  Allayne ButcherJeanna M Crystalmarie Yasin, RN 11/27/2018, 11:00 AM

## 2018-11-28 LAB — BLOOD GAS, ARTERIAL
ACID-BASE EXCESS: 6.8 mmol/L — AB (ref 0.0–2.0)
BICARBONATE: 33.7 mmol/L — AB (ref 20.0–28.0)
FIO2: 0.4
LHR: 24 {breaths}/min
MECHVT: 550 mL
Mechanical Rate: 24
O2 SAT: 98.1 %
PEEP/CPAP: 5 cmH2O
Patient temperature: 37
pCO2 arterial: 57 mmHg — ABNORMAL HIGH (ref 32.0–48.0)
pH, Arterial: 7.38 (ref 7.350–7.450)
pO2, Arterial: 107 mmHg (ref 83.0–108.0)

## 2018-11-28 LAB — CBC WITH DIFFERENTIAL/PLATELET
Abs Immature Granulocytes: 0.07 10*3/uL (ref 0.00–0.07)
BASOS ABS: 0 10*3/uL (ref 0.0–0.1)
Basophils Relative: 0 %
Eosinophils Absolute: 0 10*3/uL (ref 0.0–0.5)
Eosinophils Relative: 0 %
HCT: 44.1 % (ref 36.0–46.0)
Hemoglobin: 13.7 g/dL (ref 12.0–15.0)
Immature Granulocytes: 1 %
Lymphocytes Relative: 17 %
Lymphs Abs: 1.9 10*3/uL (ref 0.7–4.0)
MCH: 29.7 pg (ref 26.0–34.0)
MCHC: 31.1 g/dL (ref 30.0–36.0)
MCV: 95.7 fL (ref 80.0–100.0)
Monocytes Absolute: 0.7 10*3/uL (ref 0.1–1.0)
Monocytes Relative: 6 %
NRBC: 0 % (ref 0.0–0.2)
Neutro Abs: 8.8 10*3/uL — ABNORMAL HIGH (ref 1.7–7.7)
Neutrophils Relative %: 76 %
Platelets: 262 10*3/uL (ref 150–400)
RBC: 4.61 MIL/uL (ref 3.87–5.11)
RDW: 14 % (ref 11.5–15.5)
WBC: 11.4 10*3/uL — ABNORMAL HIGH (ref 4.0–10.5)

## 2018-11-28 LAB — GLUCOSE, CAPILLARY
Glucose-Capillary: 197 mg/dL — ABNORMAL HIGH (ref 70–99)
Glucose-Capillary: 161 mg/dL — ABNORMAL HIGH (ref 70–99)
Glucose-Capillary: 160 mg/dL — ABNORMAL HIGH (ref 70–99)
Glucose-Capillary: 121 mg/dL — ABNORMAL HIGH (ref 70–99)
Glucose-Capillary: 154 mg/dL — ABNORMAL HIGH (ref 70–99)
Glucose-Capillary: 195 mg/dL — ABNORMAL HIGH (ref 70–99)

## 2018-11-28 LAB — BASIC METABOLIC PANEL
ANION GAP: 8 (ref 5–15)
BUN: 57 mg/dL — ABNORMAL HIGH (ref 6–20)
CO2: 32 mmol/L (ref 22–32)
Calcium: 8.8 mg/dL — ABNORMAL LOW (ref 8.9–10.3)
Chloride: 105 mmol/L (ref 98–111)
Creatinine, Ser: 1.36 mg/dL — ABNORMAL HIGH (ref 0.44–1.00)
GFR calc Af Amer: 51 mL/min — ABNORMAL LOW (ref >60)
GFR calc non Af Amer: 44 mL/min — ABNORMAL LOW (ref >60)
Glucose, Bld: 108 mg/dL — ABNORMAL HIGH (ref 70–99)
Potassium: 4.4 mmol/L (ref 3.5–5.1)
Sodium: 145 mmol/L (ref 135–145)

## 2018-11-28 LAB — PHOSPHORUS: Phosphorus: 5.8 mg/dL — ABNORMAL HIGH (ref 2.5–4.6)

## 2018-11-28 LAB — MAGNESIUM: Magnesium: 2.6 mg/dL — ABNORMAL HIGH (ref 1.7–2.4)

## 2018-11-28 LAB — TRIGLYCERIDES: Triglycerides: 129 mg/dL (ref <150)

## 2018-11-28 MED ORDER — METHYLPREDNISOLONE SODIUM SUCC 40 MG IJ SOLR
40.0000 mg | Freq: Three times a day (TID) | INTRAMUSCULAR | Status: DC
  Administered 2018-11-28 – 2018-12-03 (×15): 40 mg via INTRAVENOUS
  Filled 2018-11-28 (×14): qty 1

## 2018-11-28 MED ORDER — PROPOFOL 1000 MG/100ML IV EMUL
5.0000 ug/kg/min | INTRAVENOUS | Status: DC
  Administered 2018-11-28: 40 ug/kg/min via INTRAVENOUS
  Administered 2018-11-28: 5 ug/kg/min via INTRAVENOUS
  Administered 2018-11-28: 40 ug/kg/min via INTRAVENOUS
  Administered 2018-11-28: 20 ug/kg/min via INTRAVENOUS
  Administered 2018-11-29 (×5): 30 ug/kg/min via INTRAVENOUS
  Administered 2018-11-30: 25 ug/kg/min via INTRAVENOUS
  Administered 2018-11-30: 30 ug/kg/min via INTRAVENOUS
  Administered 2018-11-30: 25 ug/kg/min via INTRAVENOUS
  Administered 2018-11-30: 30 ug/kg/min via INTRAVENOUS
  Administered 2018-11-30: 25 ug/kg/min via INTRAVENOUS
  Administered 2018-11-30: 30 ug/kg/min via INTRAVENOUS
  Administered 2018-12-01: 10 ug/kg/min via INTRAVENOUS
  Administered 2018-12-01: 30 ug/kg/min via INTRAVENOUS
  Filled 2018-11-28 (×17): qty 100

## 2018-11-28 MED ORDER — QUETIAPINE FUMARATE 25 MG PO TABS
50.0000 mg | ORAL_TABLET | Freq: Every day | ORAL | Status: DC
  Administered 2018-11-28 – 2018-12-03 (×5): 50 mg
  Filled 2018-11-28 (×5): qty 2

## 2018-11-28 MED ORDER — LORAZEPAM 2 MG/ML IJ SOLN
2.0000 mg | INTRAMUSCULAR | Status: DC | PRN
  Administered 2018-11-28 – 2018-12-03 (×4): 2 mg via INTRAVENOUS
  Filled 2018-11-28 (×4): qty 1

## 2018-11-28 NOTE — Progress Notes (Signed)
CRITICAL CARE NOTE  CC  Severe resp failure  SUBJECTIVE Remains on vent Severe air trapping noted RR decreased to 10 Severe wheezing High risk for cardiac arrest Remains critically ill      SIGNIFICANT EVENTS 12/15 intubated, failed biPAP 12/16 remains on vent 12/17 on vent, wheezing 12/18 severe wheezing and air trapping, unable to wean from vent   BP 125/81   Pulse 78   Temp 98.2 F (36.8 C) (Axillary)   Resp (!) 24   Ht 5\' 8"  (1.727 m)   Wt 124 kg   SpO2 98%   BMI 41.57 kg/m    REVIEW OF SYSTEMS  PATIENT IS UNABLE TO PROVIDE COMPLETE REVIEW OF SYSTEMS DUE TO SEVERE CRITICAL ILLNESS   PHYSICAL EXAMINATION:  GENERAL:critically ill appearing, +resp distress HEAD: Normocephalic, atraumatic.  EYES: Pupils equal, round, reactive to light.  No scleral icterus.  MOUTH: Moist mucosal membrane. NECK: Supple. No thyromegaly. No nodules. No JVD.  PULMONARY: +rhonchi, +wheezing CARDIOVASCULAR: S1 and S2. Regular rate and rhythm. No murmurs, rubs, or gallops.  GASTROINTESTINAL: Soft, nontender, -distended. No masses. Positive bowel sounds. No hepatosplenomegaly.  MUSCULOSKELETAL: No swelling, clubbing, or edema.  NEUROLOGIC: obtunded SKIN:intact,warm,dry      Indwelling Urinary Catheter continued, requirement due to   Reason to continue Indwelling Urinary Catheter for strict Intake/Output monitoring for hemodynamic instability         Ventilator continued, requirement due to, resp failure    Ventilator Sedation RASS 0 to -2        ASSESSMENT AND PLAN SYNOPSIS   SEVERE COPD EXACERBATION-not much improvement RR decreased to 10 due to severe air trapping -continue IV steroids as prescribed -continue NEB THERAPY as prescribed -morphine as needed -wean fio2 as needed and tolerated   Severe Hypoxic and Hypercapnic Respiratory Failure -continue Full MV support -continue Bronchodilator Therapy -Wean Fio2 and PEEP as tolerated Unable to wean  today   Renal Failure- -follow chem 7 -follow UO -continue Foley Catheter-assess need    NEUROLOGY - intubated and sedated - minimal sedation to achieve a RASS goal: -1   ELECTROLYTES -follow labs as needed -replace as needed -pharmacy consultation and following   GI/Nutrition GI PROPHYLAXIS as indicated DIET-->TF's as tolerated Constipation protocol as indicated    Critical Care Time devoted to patient care services described in this note is 34 minutes.   Overall, patient is critically ill, prognosis is guarded.  Patient with Multiorgan failure and at high risk for cardiac arrest and death.    Lucie LeatherKurian David Jasmon Mattice, M.D.  [facility]  Medical Director [facility] Medical Director [facility] Cardio-Pulmonary Department

## 2018-11-28 NOTE — Progress Notes (Signed)
[employer] [facility] [place] [facility]      PATIENT NAME: Samantha [name]    MR#:  [id number]  DATE OF BIRTH:  1966-01-08  SUBJECTIVE:   Patient remains sedated and intubated.  Difficult to wean off the vent.  Patient's daughter is at bedside.  REVIEW OF SYSTEMS:    Review of Systems  Unable to perform ROS: Intubated    Nutrition: Tube feeds Tolerating Diet: yes Tolerating PT: Await Eval   DRUG ALLERGIES:   Allergies  Allergen Reactions  . Percocet [Oxycodone-Acetaminophen] Itching    VITALS:  Blood pressure 126/60, pulse (!) 108, temperature 97.9 F (36.6 C), temperature source Axillary, resp. rate (!) 8, height 5\' 8"  (1.727 m), weight 124 kg, SpO2 96 %.  PHYSICAL EXAMINATION:   Physical Exam  GENERAL:  52 y.o.-year-old patient lying in bed sedated & Intubated.  EYES: Pupils equal, round, reactive to light and accommodation. No scleral icterus. Extraocular muscles intact.  HEENT: Head atraumatic, normocephalic. ET and OG tubes in place.  NECK:  Supple, no jugular venous distention. No thyroid enlargement, no tenderness.  LUNGS: Normal breath sounds bilaterally, no wheezing, rales, rhonchi. No use of accessory muscles of respiration.  CARDIOVASCULAR: S1, S2 normal. No murmurs, rubs, or gallops.  ABDOMEN: Soft, nontender, nondistended. Bowel sounds present. No organomegaly or mass.  EXTREMITIES: No cyanosis, clubbing or edema b/l.    NEUROLOGIC: Sedated & Intubated.   PSYCHIATRIC: Sedated & Intubated SKIN: No obvious rash, lesion, or ulcer.    LABORATORY PANEL:   CBC Recent Labs  Lab 11/28/18 1047  WBC 11.4*  HGB 13.7  HCT 44.1  PLT 262   ------------------------------------------------------------------------------------------------------------------  Chemistries  Recent Labs  Lab 11/26/18 0444  11/28/18 1047  NA 135   < > 145  K 5.2*   < > 4.4  CL 106   < > 105  CO2 19*   < > 32  GLUCOSE 135*   < > 108*  BUN 41*   < > 57*    CREATININE 2.11*   < > 1.36*  CALCIUM 8.5*   < > 8.8*  MG 2.4   < > 2.6*  AST 28  --   --   ALT 34  --   --   ALKPHOS 60  --   --   BILITOT 0.9  --   --    < > = values in this interval not displayed.   ------------------------------------------------------------------------------------------------------------------  Cardiac Enzymes Recent Labs  Lab 11/25/18 0217  TROPONINI <0.03   ------------------------------------------------------------------------------------------------------------------  RADIOLOGY:  No results found.   ASSESSMENT AND PLAN:   52 year old female with past medical history of COPD, hypertension, diabetes, obesity who presented to the hospital due to shortness of breath and noted to be in acute on chronic respiratory failure with hypoxia and hypercapnia.  1.  Acute on chronic respiratory failure with hypoxia and hypercapnia-secondary to COPD exacerbation. - Patient is currently intubated and sedated.  Continue further care as per intensivist/pulmonary.  2.  COPD exacerbation-source of patient's worsening respiratory failure with hypoxia and hypercapnia. - Patient still quite wheezy and bronchospastic.  Continue IV steroids, scheduled duo nebs, Pulmicort nebs.  Continue empiric Zithromax. -Continue vent care as per intensivist.  3.  Diabetes type 2 without complication- continue sliding scale insulin.  Follow blood sugars.  4. Neuropathy - cont. Gabapentin.   5. Acute Kidney Injury - likely ATN.  - improving and will cont. To monitor.  - renal dose meds and avoid nephrotoxins.  All the records are reviewed and case discussed with Care Management/Social Worker. Management plans discussed with the patient, family and they are in agreement.  CODE STATUS: Full code  DVT Prophylaxis: Lovenox  TOTAL TIME TAKING CARE OF THIS PATIENT: 30 minutes.   POSSIBLE D/C unclear, DEPENDING ON CLINICAL CONDITION.   Houston SirenSAINANI,Chay Mazzoni J M.D on 11/28/2018 at  3:29 PM  Between 7am to 6pm - Pager - [phone]  After 6pm go to [email or web address] - Therapist, nutritionalpassword [facility]AS [facility]  [employer] [place] [facility]  Office  [phone]  CC: Primary care physician; [facility]

## 2018-11-28 NOTE — Progress Notes (Signed)
eLink Physician-Brief Progress Note Patient Name: Samantha [name] DOB: 07-02-1966 MRN: [id number]   Date of Service  11/28/2018  HPI/Events of Note  ABG noted 7.19/74/114 on 500/24/5/28%.  eICU Interventions  Increase TV to 550.  Pt is breathing at 24.  Repeat ABG in 1 hour.      Intervention Category Minor Interventions: Other:  Larinda ButteryVanessa Sussie Minor 11/28/2018, 4:10 AM

## 2018-11-28 NOTE — Progress Notes (Addendum)
Pharmacy Electrolyte Monitoring Consult:  Pharmacy consulted to assist in monitoring and replacing electrolytes in this 52 y.o. female admitted on 11/25/2018 with COPD exacerbation.   Patient's sedation regimen currently includes fentanyl and propofol infusions.   Labs:  Sodium (mmol/L)  Date Value  11/28/2018 145  03/10/2015 140   Potassium (mmol/L)  Date Value  11/28/2018 4.4  03/10/2015 3.8   Magnesium (mg/dL)  Date Value  96/04/540912/18/2019 2.6 (H)  06/29/2014 1.9   Phosphorus (mg/dL)  Date Value  81/19/147812/18/2019 5.8 (H)   Calcium (mg/dL)  Date Value  29/56/213012/18/2019 8.8 (L)   Calcium, Total (mg/dL)  Date Value  86/57/846903/29/2016 9.0   Albumin (g/dL)  Date Value  62/95/284112/16/2019 3.7  12/07/2014 3.3 (L)    Assessment/Plan: 1. Electrolytes: SPS x 1 on 12/17. Will obtain BMP with am labs.   2. Constipation: will continue senna/docusate 1 tab VT BID. Patient will bowel movement after SPS. Will continue to monitor.   3. Glucose: Continue SSI Q4hr.   Pharmacy will continue to monitor and adjust per consult.   Samantha [name] L 11/28/2018 3:33 PM

## 2018-11-29 ENCOUNTER — Inpatient Hospital Stay: Payer: Medicare HMO

## 2018-11-29 LAB — GLUCOSE, CAPILLARY
Glucose-Capillary: 117 mg/dL — ABNORMAL HIGH (ref 70–99)
Glucose-Capillary: 133 mg/dL — ABNORMAL HIGH (ref 70–99)
Glucose-Capillary: 163 mg/dL — ABNORMAL HIGH (ref 70–99)
Glucose-Capillary: 185 mg/dL — ABNORMAL HIGH (ref 70–99)
Glucose-Capillary: 192 mg/dL — ABNORMAL HIGH (ref 70–99)
Glucose-Capillary: 230 mg/dL — ABNORMAL HIGH (ref 70–99)

## 2018-11-29 LAB — BLOOD GAS, ARTERIAL
Acid-Base Excess: 7.4 mmol/L — ABNORMAL HIGH (ref 0.0–2.0)
Bicarbonate: 36.1 mmol/L — ABNORMAL HIGH (ref 20.0–28.0)
FIO2: 0.4
LHR: 8 {breaths}/min
MECHVT: 600 mL
O2 Saturation: 98.5 %
PEEP/CPAP: 5 cmH2O
Patient temperature: 37
pCO2 arterial: 70 mmHg (ref 32.0–48.0)
pH, Arterial: 7.32 — ABNORMAL LOW (ref 7.350–7.450)
pO2, Arterial: 123 mmHg — ABNORMAL HIGH (ref 83.0–108.0)

## 2018-11-29 LAB — BASIC METABOLIC PANEL
Anion gap: 8 (ref 5–15)
BUN: 61 mg/dL — ABNORMAL HIGH (ref 6–20)
CO2: 32 mmol/L (ref 22–32)
Calcium: 8.9 mg/dL (ref 8.9–10.3)
Chloride: 105 mmol/L (ref 98–111)
Creatinine, Ser: 1.24 mg/dL — ABNORMAL HIGH (ref 0.44–1.00)
GFR calc Af Amer: 57 mL/min — ABNORMAL LOW (ref >60)
GFR calc non Af Amer: 50 mL/min — ABNORMAL LOW (ref >60)
Glucose, Bld: 182 mg/dL — ABNORMAL HIGH (ref 70–99)
Potassium: 4.9 mmol/L (ref 3.5–5.1)
Sodium: 145 mmol/L (ref 135–145)

## 2018-11-29 LAB — MAGNESIUM: Magnesium: 2.7 mg/dL — ABNORMAL HIGH (ref 1.7–2.4)

## 2018-11-29 LAB — PHOSPHORUS: Phosphorus: 4 mg/dL (ref 2.5–4.6)

## 2018-11-29 MED ORDER — SENNOSIDES-DOCUSATE SODIUM 8.6-50 MG PO TABS
2.0000 | ORAL_TABLET | Freq: Two times a day (BID) | ORAL | Status: DC
  Administered 2018-11-29 – 2018-12-04 (×9): 2
  Filled 2018-11-29 (×8): qty 2

## 2018-11-29 MED ORDER — INSULIN ASPART 100 UNIT/ML ~~LOC~~ SOLN
3.0000 [IU] | Freq: Once | SUBCUTANEOUS | Status: AC
  Administered 2018-11-29: 3 [IU] via SUBCUTANEOUS

## 2018-11-29 MED ORDER — INSULIN ASPART 100 UNIT/ML ~~LOC~~ SOLN
2.0000 [IU] | SUBCUTANEOUS | Status: DC
  Administered 2018-11-29: 6 [IU] via SUBCUTANEOUS
  Filled 2018-11-29: qty 1

## 2018-11-29 MED ORDER — INSULIN ASPART 100 UNIT/ML ~~LOC~~ SOLN
3.0000 [IU] | SUBCUTANEOUS | Status: DC
  Administered 2018-11-29: 3 [IU] via SUBCUTANEOUS
  Administered 2018-11-30 (×7): 6 [IU] via SUBCUTANEOUS
  Administered 2018-12-01: 3 [IU] via SUBCUTANEOUS
  Administered 2018-12-01 (×2): 6 [IU] via SUBCUTANEOUS
  Administered 2018-12-02 (×3): 3 [IU] via SUBCUTANEOUS
  Administered 2018-12-02: 6 [IU] via SUBCUTANEOUS
  Administered 2018-12-02 (×2): 3 [IU] via SUBCUTANEOUS
  Administered 2018-12-03: 6 [IU] via SUBCUTANEOUS
  Filled 2018-11-29 (×19): qty 1

## 2018-11-29 MED ORDER — POLYETHYLENE GLYCOL 3350 17 G PO PACK
17.0000 g | PACK | Freq: Once | ORAL | Status: AC
  Administered 2018-11-29: 17 g
  Filled 2018-11-29: qty 1

## 2018-11-29 NOTE — Progress Notes (Signed)
Family call out to let RN know that patient is uncomfortable, Dr Belia HemanKasa notified.  Dr Belia HemanKasa in room to reassess, MD ordered for sedation to resume.

## 2018-11-29 NOTE — Progress Notes (Signed)
Pharmacy Electrolyte Monitoring Consult:  Pharmacy consulted to assist in monitoring and replacing electrolytes in this 52 y.o. female admitted on 11/25/2018 with COPD exacerbation.   Patient's sedation regimen currently includes fentanyl and propofol infusions.   Labs:  Sodium (mmol/L)  Date Value  11/29/2018 145  03/10/2015 140   Potassium (mmol/L)  Date Value  11/29/2018 4.9  03/10/2015 3.8   Magnesium (mg/dL)  Date Value  16/10/960412/19/2019 2.7 (H)  06/29/2014 1.9   Phosphorus (mg/dL)  Date Value  54/09/811912/19/2019 4.0   Calcium (mg/dL)  Date Value  14/78/295612/19/2019 8.9   Calcium, Total (mg/dL)  Date Value  21/30/865703/29/2016 9.0   Albumin (g/dL)  Date Value  84/69/629512/16/2019 3.7  12/07/2014 3.3 (L)    Assessment/Plan: 1. Electrolytes: No replacement warranted. Will obtain BMP with am labs.   2. Constipation: Last documented bowel movement 12/17. Will increase senna/docusateto  2 tab VT BID and Miralax VT x 1. Will continue to monitor.   3. Glucose: Methylprednisolone 40mg  IV Q8hr. Transition patient to ICU Hyperglycemia protocol.   Pharmacy will continue to monitor and adjust per consult.   Edynn Gillock L 11/29/2018 9:05 AM

## 2018-11-29 NOTE — Progress Notes (Signed)
Notified Dr. Belia HemanKasa of ABG results, he does not want to make any changes at this time, RN aware.

## 2018-11-29 NOTE — Progress Notes (Signed)
[employer] - [place] at [facility]      PATIENT NAME: Samantha [name]    MR#:  [id number]  DATE OF BIRTH:  02/10/1966  SUBJECTIVE:   Patient remains sedated and intubated.  Attempted SBT this morning but pt. Failed and now sedated again.  Family at bedside.   REVIEW OF SYSTEMS:    Review of Systems  Unable to perform ROS: Intubated    Nutrition: Tube feeds Tolerating Diet: yes Tolerating PT: Await Eval   DRUG ALLERGIES:   Allergies  Allergen Reactions  . Percocet [Oxycodone-Acetaminophen] Itching    VITALS:  Blood pressure 111/85, pulse 86, temperature 98.9 F (37.2 C), temperature source Oral, resp. rate (!) 8, height 5\' 8"  (1.727 m), weight 124.6 kg, SpO2 100 %.  PHYSICAL EXAMINATION:   Physical Exam  GENERAL:  52 y.o.-year-old patient lying in bed sedated & Intubated.  EYES: Pupils equal, round, reactive to light and accommodation. No scleral icterus. Extraocular muscles intact.  HEENT: Head atraumatic, normocephalic. ET and OG tubes in place.  NECK:  Supple, no jugular venous distention. No thyroid enlargement, no tenderness.  LUNGS: Normal breath sounds bilaterally, diffuse end-exp. Wheezing b/l, No rales, rhonchi. No use of accessory muscles of respiration.  CARDIOVASCULAR: S1, S2 normal. No murmurs, rubs, or gallops.  ABDOMEN: Soft, nontender, nondistended. Bowel sounds present. No organomegaly or mass.  EXTREMITIES: No cyanosis, clubbing or edema b/l.    NEUROLOGIC: Sedated & Intubated.   PSYCHIATRIC: Sedated & Intubated SKIN: No obvious rash, lesion, or ulcer.    LABORATORY PANEL:   CBC Recent Labs  Lab 11/28/18 1047  WBC 11.4*  HGB 13.7  HCT 44.1  PLT 262   ------------------------------------------------------------------------------------------------------------------  Chemistries  Recent Labs  Lab 11/26/18 0444  11/29/18 0421  NA 135   < > 145  K 5.2*   < > 4.9  CL 106   < > 105  CO2 19*   < > 32  GLUCOSE 135*   <  > 182*  BUN 41*   < > 61*  CREATININE 2.11*   < > 1.24*  CALCIUM 8.5*   < > 8.9  MG 2.4   < > 2.7*  AST 28  --   --   ALT 34  --   --   ALKPHOS 60  --   --   BILITOT 0.9  --   --    < > = values in this interval not displayed.   ------------------------------------------------------------------------------------------------------------------  Cardiac Enzymes Recent Labs  Lab 11/25/18 0217  TROPONINI <0.03   ------------------------------------------------------------------------------------------------------------------  RADIOLOGY:  Dg Chest Port 1 View  Result Date: 11/29/2018 CLINICAL DATA:  Respiratory failure EXAM: PORTABLE CHEST 1 VIEW COMPARISON:  Portable chest x-ray of 11/26/2018 and 11/25/2017 FINDINGS: The tip of the endotracheal tube is approximately 3.5 cm above the carina. Right PICC line tip is within the upper SVC. No definite pneumothorax is noted. No pneumonia or effusion is seen, with only mild basilar atelectasis present. Mild cardiomegaly is stable. No bony abnormality is evident. IMPRESSION: 1. Tip of endotracheal tube approximately 3.5 cm above the carina. 2. Right PICC line tip overlies the upper SVC.  No pneumothorax. 3. Very mild bibasilar linear atelectasis. Electronically Signed   By: Dwyane DeePaul  Barry M.D.   On: 11/29/2018 11:20     ASSESSMENT AND PLAN:   52 year old female with past medical history of COPD, hypertension, diabetes, obesity who presented to the hospital due to shortness of breath and noted to be in acute  on chronic respiratory failure with hypoxia and hypercapnia.  1.  Acute on chronic respiratory failure with hypoxia and hypercapnia-secondary to COPD exacerbation. - Patient is currently intubated and sedated.   - attempted weaning trial today but pt. Failed.  Will reattempt in a.m.   2.  COPD exacerbation-source of patient's worsening respiratory failure with hypoxia and hypercapnia. - Patient remains wheezy and bronchospastic.   -  Continue IV steroids, scheduled duo nebs, Pulmicort nebs.  Continue empiric Zithromax. -Continue vent care as per intensivist.  3.  Diabetes type 2 without complication- continue sliding scale insulin.  Follow blood sugars.  4. Neuropathy - cont. Gabapentin.   5. Acute Kidney Injury - likely ATN.  - improved with IV fluid hydration.     All the records are reviewed and case discussed with Care Management/Social Worker. Management plans discussed with the patient, family and they are in agreement.  CODE STATUS: Full code  DVT Prophylaxis: Lovenox  TOTAL TIME TAKING CARE OF THIS PATIENT: 30 minutes.   POSSIBLE D/C unclear, DEPENDING ON CLINICAL CONDITION.   [place] SirenSAINANI,Obdulio Mash J M.D on 11/29/2018 at 3:50 PM  Between 7am to 6pm - Pager - [phone]  After 6pm go to [email or web address] - Therapist, nutritionalpassword EPAS [facility]  [facility] [place] [facility]  Office  [phone]  CC: Primary care physician; [facility] [employer]

## 2018-11-29 NOTE — Progress Notes (Signed)
Family at bedside.  Patient off of sedation, follows command, but getting tired.  Dr Belia HemanKasa placed patient on pressure support, per Belia HemanKasa will reassess after round regarding placing back on sedation.

## 2018-11-29 NOTE — Progress Notes (Signed)
CRITICAL CARE NOTE  CC  Severe resp failure  SUBJECTIVE +wheezing Increased WOB Remains on vent Severe air trapping  Remains critically ill High risk for cardiac arrest      SIGNIFICANT EVENTS 12/15 intubated, failed biPAP 12/16 remains on vent 12/17 on vent, wheezing 12/18 severe wheezing and air trapping, unable to wean from vent 12/19 severe wheezing, air trapping, not able to wean from vent today  BP 121/86   Pulse 97   Temp 97.6 F (36.4 C) (Axillary)   Resp 13   Ht 5\' 8"  (1.727 m)   Wt 124.6 kg   SpO2 100%   BMI 41.77 kg/m    REVIEW OF SYSTEMS  PATIENT IS UNABLE TO PROVIDE COMPLETE REVIEW OF SYSTEMS DUE TO SEVERE CRITICAL ILLNESS  PHYSICAL EXAMINATION:  GENERAL:critically ill appearing, +resp distress HEAD: Normocephalic, atraumatic.  EYES: Pupils equal, round, reactive to light.  No scleral icterus.  MOUTH: Moist mucosal membrane. NECK: Supple. No thyromegaly. No nodules. No JVD.  PULMONARY: +rhonchi, +wheezing CARDIOVASCULAR: S1 and S2. Regular rate and rhythm. No murmurs, rubs, or gallops.  GASTROINTESTINAL: Soft, nontender, -distended. No masses. Positive bowel sounds. No hepatosplenomegaly.  MUSCULOSKELETAL: No swelling, clubbing, or edema.  NEUROLOGIC: obtunded SKIN:intact,warm,dry        Indwelling Urinary Catheter continued, requirement due to   Reason to continue Indwelling Urinary Catheter for strict Intake/Output monitoring for hemodynamic instability   Central Line continued, requirement due to   Reason to continue [employer] Monitoring of central venous pressure or other hemodynamic parameters   Ventilator continued, requirement due to, resp failure    Ventilator Sedation RASS 0 to -2         ASSESSMENT AND PLAN SYNOPSIS    SEVERE COPD EXACERBATION-not much improvement -continue IV steroids as prescribed -continue NEB THERAPY as prescribed -morphine as needed -wean fio2 as needed and tolerated   Severe  Hypoxic and Hypercapnic Respiratory Failure -continue Mechanical Ventilator support -continue Bronchodilator Therapy -Wean Fio2 and PEEP as tolerated -VAP/VENT bundle implementation Unable to wean today due to severe wheezing    Renal Failure -follow chem 7 -follow UO -continue Foley Catheter-assess need    NEUROLOGY - intubated and sedated - minimal sedation to achieve a RASS goal: -1   ELECTROLYTES -follow labs as needed -replace as needed -pharmacy consultation and following    GI/Nutrition GI PROPHYLAXIS as indicated DIET-->TF's as tolerated Constipation protocol as indicated     Critical Care Time devoted to patient care services described in this note is 36 minutes.   Overall, patient is critically ill, prognosis is guarded.  Patient with Multiorgan failure and at high risk for cardiac arrest and death.    Lucie LeatherKurian David Seleen Walter, M.D.  [facility]  Medical Director [facility] Medical Director [facility]

## 2018-11-29 NOTE — Progress Notes (Signed)
Per Dr Belia HemanKasa, will not be extubating today, wait until family at bedside to do sedation vacation.

## 2018-11-30 LAB — BASIC METABOLIC PANEL
ANION GAP: 6 (ref 5–15)
BUN: 61 mg/dL — ABNORMAL HIGH (ref 6–20)
CO2: 34 mmol/L — ABNORMAL HIGH (ref 22–32)
Calcium: 9.3 mg/dL (ref 8.9–10.3)
Chloride: 104 mmol/L (ref 98–111)
Creatinine, Ser: 1.16 mg/dL — ABNORMAL HIGH (ref 0.44–1.00)
GFR calc Af Amer: >60 mL/min (ref >60)
GFR, EST NON AFRICAN AMERICAN: 54 mL/min — AB (ref >60)
Glucose, Bld: 213 mg/dL — ABNORMAL HIGH (ref 70–99)
Potassium: 5.1 mmol/L (ref 3.5–5.1)
Sodium: 144 mmol/L (ref 135–145)

## 2018-11-30 LAB — PHOSPHORUS: Phosphorus: 3.5 mg/dL (ref 2.5–4.6)

## 2018-11-30 LAB — BLOOD GAS, ARTERIAL
Acid-Base Excess: 15.5 mmol/L — ABNORMAL HIGH (ref 0.0–2.0)
Bicarbonate: 44.2 mmol/L — ABNORMAL HIGH (ref 20.0–28.0)
FIO2: 40
MECHVT: 600 mL
Mechanical Rate: 12
O2 Saturation: 98.9 %
PATIENT TEMPERATURE: 37
PEEP/CPAP: 5 cmH2O
pCO2 arterial: 73 mmHg (ref 32.0–48.0)
pH, Arterial: 7.39 (ref 7.350–7.450)
pO2, Arterial: 130 mmHg — ABNORMAL HIGH (ref 83.0–108.0)

## 2018-11-30 LAB — GLUCOSE, CAPILLARY
Glucose-Capillary: 193 mg/dL — ABNORMAL HIGH (ref 70–99)
Glucose-Capillary: 190 mg/dL — ABNORMAL HIGH (ref 70–99)
Glucose-Capillary: 185 mg/dL — ABNORMAL HIGH (ref 70–99)
Glucose-Capillary: 195 mg/dL — ABNORMAL HIGH (ref 70–99)
Glucose-Capillary: 194 mg/dL — ABNORMAL HIGH (ref 70–99)
Glucose-Capillary: 161 mg/dL — ABNORMAL HIGH (ref 70–99)

## 2018-11-30 LAB — MAGNESIUM: Magnesium: 2.9 mg/dL — ABNORMAL HIGH (ref 1.7–2.4)

## 2018-11-30 MED ORDER — IPRATROPIUM-ALBUTEROL 0.5-2.5 (3) MG/3ML IN SOLN
RESPIRATORY_TRACT | Status: AC
  Administered 2018-11-30: 22:00:00
  Filled 2018-11-30: qty 3

## 2018-11-30 MED ORDER — VITAL HIGH PROTEIN PO LIQD
1000.0000 mL | ORAL | Status: DC
  Administered 2018-11-30: 1000 mL

## 2018-11-30 MED ORDER — PRO-STAT SUGAR FREE PO LIQD
60.0000 mL | Freq: Four times a day (QID) | ORAL | Status: DC
  Administered 2018-11-30 – 2018-12-01 (×4): 60 mL

## 2018-11-30 MED ORDER — POLYETHYLENE GLYCOL 3350 17 G PO PACK
17.0000 g | PACK | Freq: Every day | ORAL | Status: DC
  Administered 2018-11-30 – 2018-12-04 (×4): 17 g
  Filled 2018-11-30 (×4): qty 1

## 2018-11-30 MED ORDER — IPRATROPIUM-ALBUTEROL 0.5-2.5 (3) MG/3ML IN SOLN
3.0000 mL | RESPIRATORY_TRACT | Status: DC | PRN
  Administered 2018-11-30 – 2018-12-04 (×5): 3 mL via RESPIRATORY_TRACT
  Filled 2018-11-30 (×4): qty 3

## 2018-11-30 MED ORDER — FUROSEMIDE 10 MG/ML IJ SOLN
60.0000 mg | Freq: Once | INTRAMUSCULAR | Status: AC
  Administered 2018-11-30: 60 mg via INTRAVENOUS
  Filled 2018-11-30: qty 6

## 2018-11-30 NOTE — Progress Notes (Signed)
Nutrition Follow-up  DOCUMENTATION CODES:   Morbid obesity  INTERVENTION:  Initiate new goal TF regimen of Vital High Protein at 20 mL/hr (480 mL goal daily volume) + Pro-Stat 60 mL QID per tube. Provides 1280 kcal, 162 grams of protein, 403 mL H2O daily. With current propofol rate provides 1771 kcal daily.  Continue liquid MVI daily per tube.  Provide minimum free water flush of 30 mL Q4hrs to maintain tube patency.  NUTRITION DIAGNOSIS:   Inadequate oral intake related to inability to eat as evidenced by NPO status.  Ongoing - addressing with TF regimen.  GOAL:   Provide needs based on ASPEN/SCCM guidelines  Met with TF regimen.  MONITOR:   Vent status, Labs, Weight trends, TF tolerance, I & O's  REASON FOR ASSESSMENT:   Ventilator    ASSESSMENT:   52 year old female with PMHx of asthma, HTN, DM, COPD who is admitted with severe hypoxic and hypercapnic respiratory failure from COPD exacerbation requiring intubation on 12/15.  Patient remains intubated and sedated. On PRVC mode with FiO2 40% and PEEP 5 cmH2O. Patient has been failing SBTs. Tolerating tube feeds. Abdomen distended but soft. Last BM was 11/27/2018 per chart but there were no BM characteristics documented so unsure how accurate that is. Patient is now on propofol gtt.  Enteral Access: 16 Fr. OGT placed 12/15; terminates in stomach per chest x-ray 12/15; 58 cm at corner of mouth  MAP: 96-105 mmHg  TF: pt tolerating Vital High Protein at 50 mL/hr + Pro-Stat 60 mL BID  Patient is currently intubated on ventilator support Ve: 6.4 L/min Temp (24hrs), Avg:98.1 F (36.7 C), Min:97.2 F (36.2 C), Max:98.9 F (37.2 C)  Propofol: 18.6 mL/hr (491 kcal daily)  Medications reviewed and include: famotidine, gabapentin 300 mg Q12hrs per tube, Novolog 3-9 units Q4hrs, Solu-Medrol 40 mg Q8hrs IV, liquid MVI daily per tube, Seroquel QHS per tube, senna-docusate 2 tablets BID per tube, fentanyl gtt, propofol  gtt.  Labs reviewed: CBG 117-193, CO2 34, BUN 61, Creatinine 1.16.  I/O: 2400 mL UOP yesterday (0.8 mL/kg/hr)  Weight trend: 124.9 kg on 12/20; +3.8 kg from admission  Discussed with RN and on rounds.  Diet Order:   Diet Order            Diet NPO time specified  Diet effective now             EDUCATION NEEDS:   No education needs have been identified at this time  Skin:  Skin Assessment: Reviewed RN Assessment  Last BM:  11/27/2018 per chart (no BM characteristics recorded)  Height:   Ht Readings from Last 1 Encounters:  11/25/18 '5\' 8"'  (1.727 m)   Weight:   Wt Readings from Last 1 Encounters:  11/30/18 124.9 kg   Ideal Body Weight:  63.6 kg  BMI:  Body mass index is 41.87 kg/m.  Estimated Nutritional Needs:   Kcal:  2409-7353 (11-14 kcal/kg)  Protein:  159 grams (2.5 grams/kg IBW)  Fluid:  1.6 L/day (25 mL/kg IBW)  Willey Blade, MS, RD, LDN Office: [phone] Pager: [phone] After Hours/Weekend Pager: [phone]

## 2018-11-30 NOTE — Progress Notes (Signed)
CRITICAL CARE NOTE  CC  Severe resp failure  SUBJECTIVE +extensive wheezing Failed SAt/SBT whne family was present yesterday Increased WOB Remains on vent Severe air trapping High risk for cardiac arrest   SIGNIFICANT EVENTS 12/15 intubated, failed biPAP 12/16 remains on vent 12/17 on vent, wheezing 12/18 severe wheezing and air trapping, unable to wean from vent 12/19 severe wheezing, air trapping, not able to wean from vent today 12/20 severe wheezing, air trappinig,   BP (!) 145/96   Pulse 84   Temp 98 F (36.7 C) (Axillary)   Resp 18   Ht 5' 8" (1.727 m)   Wt 124.9 kg   SpO2 100%   BMI 41.87 kg/m    REVIEW OF SYSTEMS  PATIENT IS UNABLE TO PROVIDE COMPLETE REVIEW OF SYSTEMS DUE TO SEVERE CRITICAL ILLNESS  PHYSICAL EXAMINATION:  PHYSICAL EXAMINATION:  GENERAL:critically ill appearing, +resp distress HEAD: Normocephalic, atraumatic.  EYES: Pupils equal, round, reactive to light.  No scleral icterus.  MOUTH: Moist mucosal membrane. NECK: Supple. No thyromegaly. No nodules. No JVD.  PULMONARY: +rhonchi, +wheezing CARDIOVASCULAR: S1 and S2. Regular rate and rhythm. No murmurs, rubs, or gallops.  GASTROINTESTINAL: Soft, nontender, -distended. No masses. Positive bowel sounds. No hepatosplenomegaly.  MUSCULOSKELETAL: No swelling, clubbing, or edema.  NEUROLOGIC: obtunded SKIN:intact,warm,dry        Indwelling Urinary Catheter continued, requirement due to   Reason to continue Indwelling Urinary Catheter for strict Intake/Output monitoring for hemodynamic instability   Central Line continued, requirement due to   Reason to continue [employer] Monitoring of central venous pressure or other hemodynamic parameters   Ventilator continued, requirement due to, resp failure    Ventilator Sedation RASS 0 to -2      ASSESSMENT AND PLAN SYNOPSIS  SEVERE COPD EXACERBATION-persistent wheezing  -continue IV steroids as prescribed -continue NEB THERAPY  as prescribed -morphine as needed -wean fio2 as needed and tolerated   Severe Hypoxic and Hypercapnic Respiratory Failure -continue Mechanical Ventilator support -continue Bronchodilator Therapy -Wean Fio2 and PEEP as tolerated -VAP/VENT bundle implementation -will perform SAT/SBT when respiratory parameters are met when family arrives   Renal Failure- -follow chem 7 -follow UO -continue Foley Catheter-assess need   NEUROLOGY - intubated and sedated - minimal sedation to achieve a RASS goal: -1   ELECTROLYTES -follow labs as needed -replace as needed -pharmacy consultation and following    DVT/GI PRX ordered TRANSFUSIONS AS NEEDED MONITOR FSBS ASSESS the need for LABS as needed    Critical Care Time devoted to patient care services described in this note is 34 minutes.   Overall, patient is critically ill, prognosis is guarded.  Patient with Multiorgan failure and at high risk for cardiac arrest and death.    Corrin Parker, M.D.  [facility]  Medical Director [place] Director [facility] Cardio-Pulmonary Department

## 2018-11-30 NOTE — Progress Notes (Signed)
Attempted to wean pt, all IV sedative meds off and patient quickly woke up and followed commands. However pt quickly became very anxious, HR Up to 120s and became diaphoretic and so replaced on vent and IV meds resumed, MD in to see daughter, all questions answered as asked

## 2018-11-30 NOTE — Progress Notes (Signed)
[employer] - [place] at [facility]      PATIENT NAME: Samantha SchimkeDeffiney [name]    MR#:  [id number]  DATE OF BIRTH:  [birth year]  SUBJECTIVE:   Patient remains sedated and intubated.  Pt. Failed vent wean again this a.m.  REVIEW OF SYSTEMS:    Review of Systems  Unable to perform ROS: Intubated    Nutrition: Tube feeds Tolerating Diet: yes Tolerating PT: Await Eval   DRUG ALLERGIES:   Allergies  Allergen Reactions  . Percocet [Oxycodone-Acetaminophen] Itching    VITALS:  Blood pressure (!) 140/98, pulse 89, temperature 99.5 F (37.5 C), resp. rate 11, height 5\' 8"  (1.727 m), weight 124.9 kg, SpO2 100 %.  PHYSICAL EXAMINATION:   Physical Exam  GENERAL:  52 y.o.-year-old patient lying in bed sedated & Intubated.  EYES: Pupils equal, round, reactive to light and accommodation. No scleral icterus. Extraocular muscles intact.  HEENT: Head atraumatic, normocephalic. ET and OG tubes in place.  NECK:  Supple, no jugular venous distention. No thyroid enlargement, no tenderness.  LUNGS: Normal breath sounds bilaterally, diffuse end-exp. Wheezing b/l, No rales, rhonchi. No use of accessory muscles of respiration.  CARDIOVASCULAR: S1, S2 normal. No murmurs, rubs, or gallops.  ABDOMEN: Soft, nontender, nondistended. Bowel sounds present. No organomegaly or mass.  EXTREMITIES: No cyanosis, clubbing or edema b/l.    NEUROLOGIC: Sedated & Intubated.   PSYCHIATRIC: Sedated & Intubated SKIN: No obvious rash, lesion, or ulcer.    LABORATORY PANEL:   CBC Recent Labs  Lab 11/28/18 1047  WBC 11.4*  HGB 13.7  HCT 44.1  PLT 262   ------------------------------------------------------------------------------------------------------------------  Chemistries  Recent Labs  Lab 11/26/18 0444  11/30/18 0429  NA 135   < > 144  K 5.2*   < > 5.1  CL 106   < > 104  CO2 19*   < > 34*  GLUCOSE 135*   < > 213*  BUN 41*   < > 61*  CREATININE 2.11*   < > 1.16*  CALCIUM 8.5*    < > 9.3  MG 2.4   < > 2.9*  AST 28  --   --   ALT 34  --   --   ALKPHOS 60  --   --   BILITOT 0.9  --   --    < > = values in this interval not displayed.   ------------------------------------------------------------------------------------------------------------------  Cardiac Enzymes Recent Labs  Lab 11/25/18 0217  TROPONINI <0.03   ------------------------------------------------------------------------------------------------------------------  RADIOLOGY:  Dg Chest Port 1 View  Result Date: 11/29/2018 CLINICAL DATA:  Respiratory failure EXAM: PORTABLE CHEST 1 VIEW COMPARISON:  Portable chest x-ray of 11/26/2018 and 11/25/2017 FINDINGS: The tip of the endotracheal tube is approximately 3.5 cm above the carina. Right PICC line tip is within the upper SVC. No definite pneumothorax is noted. No pneumonia or effusion is seen, with only mild basilar atelectasis present. Mild cardiomegaly is stable. No bony abnormality is evident. IMPRESSION: 1. Tip of endotracheal tube approximately 3.5 cm above the carina. 2. Right PICC line tip overlies the upper SVC.  No pneumothorax. 3. Very mild bibasilar linear atelectasis. Electronically Signed   By: Dwyane DeePaul  Barry M.D.   On: 11/29/2018 11:20     ASSESSMENT AND PLAN:   52 year old female with past medical history of COPD, hypertension, diabetes, obesity who presented to the hospital due to shortness of breath and noted to be in acute on chronic respiratory failure with hypoxia and hypercapnia.  1.  Acute  on chronic respiratory failure with hypoxia and hypercapnia-secondary to COPD exacerbation. -Patient remains sedated and intubated.  Patient failed vent weaning this morning again.  Intensivist is considering possible need for tracheostomy.  2.  COPD exacerbation-source of patient's worsening respiratory failure with hypoxia and hypercapnia. - Patient remains wheezy and bronchospastic.   - Continue IV steroids, scheduled duo nebs, Pulmicort  nebs.  Continue empiric Zithromax. -Continue vent care as per intensivist.  3.  Diabetes type 2 without complication- continue sliding scale insulin.   - BS stable  4. Neuropathy - cont. Gabapentin.   5. Acute Kidney Injury - likely ATN.  - resolved with IV fluid hydration. Cr. At baseline now.      All the records are reviewed and case discussed with Care Management/Social Worker. Management plans discussed with the patient, family and they are in agreement.  CODE STATUS: Full code  DVT Prophylaxis: Lovenox  TOTAL TIME TAKING CARE OF THIS PATIENT: 30 minutes.   POSSIBLE D/C unclear, DEPENDING ON CLINICAL CONDITION.   Houston SirenSAINANI, J M.D on 11/30/2018 at 4:49 PM  Between 7am to 6pm - Pager - [phone]  After 6pm go to [email or web address] - Therapist, nutritionalpassword EPAS [facility]  [facility] [place] [facility]  Office  [phone]  CC: Primary care physician; [facility] [employer]

## 2018-11-30 NOTE — Progress Notes (Signed)
Pharmacy Electrolyte Monitoring Consult:  Pharmacy consulted to assist in monitoring and replacing electrolytes in this 52 y.o. female admitted on 11/25/2018 with COPD exacerbation.   Patient's sedation regimen currently includes fentanyl and propofol infusions.   Labs:  Sodium (mmol/L)  Date Value  11/30/2018 144  03/10/2015 140   Potassium (mmol/L)  Date Value  11/30/2018 5.1  03/10/2015 3.8   Magnesium (mg/dL)  Date Value  16/10/960412/20/2019 2.9 (H)  06/29/2014 1.9   Phosphorus (mg/dL)  Date Value  54/09/811912/20/2019 3.5   Calcium (mg/dL)  Date Value  14/78/295612/20/2019 9.3   Calcium, Total (mg/dL)  Date Value  21/30/865703/29/2016 9.0   Albumin (g/dL)  Date Value  84/69/629512/16/2019 3.7  12/07/2014 3.3 (L)    Assessment/Plan: 1. Electrolytes: Furosemide 60 mg IV  X 1. No replacement warranted.  Will obtain BMP with am labs.   2. Constipation: Last documented bowel movement 12/17. Will increase senna/docusate  2 tab VT BID and Miralax VT Daily. If no bowel movement by 12/21, will order bisacody suppository. Will continue to monitor.   3. Glucose: Methylprednisolone 40mg  IV Q8hr. Continue Hyperglycemia protocol SSI Q4hr.   Pharmacy will continue to monitor and adjust per consult.   Daylene Vandenbosch L 11/30/2018 4:24 PM

## 2018-12-01 ENCOUNTER — Inpatient Hospital Stay (HOSPITAL_COMMUNITY)
Admit: 2018-12-01 | Discharge: 2018-12-01 | Disposition: A | Discharge status: Home / Self Care | Payer: Medicare HMO | Attending: Internal Medicine | Admitting: Internal Medicine

## 2018-12-01 ENCOUNTER — Inpatient Hospital Stay: Payer: Medicare HMO

## 2018-12-01 DIAGNOSIS — R0603 Acute respiratory distress: Secondary | ICD-10-CM

## 2018-12-01 LAB — COMPREHENSIVE METABOLIC PANEL
ALBUMIN: 3.2 g/dL — AB (ref 3.5–5.0)
ALT: 54 U/L — ABNORMAL HIGH (ref 0–44)
AST: 24 U/L (ref 15–41)
Alkaline Phosphatase: 60 U/L (ref 38–126)
Anion gap: 7 (ref 5–15)
BUN: 76 mg/dL — ABNORMAL HIGH (ref 6–20)
CO2: 38 mmol/L — ABNORMAL HIGH (ref 22–32)
Calcium: 9.6 mg/dL (ref 8.9–10.3)
Chloride: 102 mmol/L (ref 98–111)
Creatinine, Ser: 1.12 mg/dL — ABNORMAL HIGH (ref 0.44–1.00)
GFR calc Af Amer: >60 mL/min (ref >60)
GFR calc non Af Amer: 56 mL/min — ABNORMAL LOW (ref >60)
Glucose, Bld: 178 mg/dL — ABNORMAL HIGH (ref 70–99)
Potassium: 4.8 mmol/L (ref 3.5–5.1)
Sodium: 147 mmol/L — ABNORMAL HIGH (ref 135–145)
Total Bilirubin: 0.4 mg/dL (ref 0.3–1.2)
Total Protein: 6.8 g/dL (ref 6.5–8.1)

## 2018-12-01 LAB — BLOOD GAS, ARTERIAL
Acid-Base Excess: 16.1 mmol/L — ABNORMAL HIGH (ref 0.0–2.0)
Bicarbonate: 44.4 mmol/L — ABNORMAL HIGH (ref 20.0–28.0)
FIO2: 40
MECHVT: 600 mL
Mechanical Rate: 12
O2 Saturation: 99 %
PEEP: 5 cmH2O
Patient temperature: 37
pCO2 arterial: 70 mmHg (ref 32.0–48.0)
pH, Arterial: 7.41 (ref 7.350–7.450)
pO2, Arterial: 131 mmHg — ABNORMAL HIGH (ref 83.0–108.0)

## 2018-12-01 LAB — CBC
HCT: 40.5 % (ref 36.0–46.0)
Hemoglobin: 12.4 g/dL (ref 12.0–15.0)
MCH: 29.2 pg (ref 26.0–34.0)
MCHC: 30.6 g/dL (ref 30.0–36.0)
MCV: 95.3 fL (ref 80.0–100.0)
Platelets: 255 10*3/uL (ref 150–400)
RBC: 4.25 MIL/uL (ref 3.87–5.11)
RDW: 13.5 % (ref 11.5–15.5)
WBC: 8.8 10*3/uL (ref 4.0–10.5)
nRBC: 0 % (ref 0.0–0.2)

## 2018-12-01 LAB — GLUCOSE, CAPILLARY
Glucose-Capillary: 163 mg/dL — ABNORMAL HIGH (ref 70–99)
Glucose-Capillary: 150 mg/dL — ABNORMAL HIGH (ref 70–99)
Glucose-Capillary: 180 mg/dL — ABNORMAL HIGH (ref 70–99)
Glucose-Capillary: 119 mg/dL — ABNORMAL HIGH (ref 70–99)
Glucose-Capillary: 102 mg/dL — ABNORMAL HIGH (ref 70–99)

## 2018-12-01 LAB — URINE DRUG SCREEN, QUALITATIVE (ARMC ONLY)
Amphetamines, Ur Screen: NOT DETECTED
BARBITURATES, UR SCREEN: NOT DETECTED
Benzodiazepine, Ur Scrn: POSITIVE — AB
Cannabinoid 50 Ng, Ur ~~LOC~~: NOT DETECTED
Cocaine Metabolite,Ur ~~LOC~~: NOT DETECTED
MDMA (Ecstasy)Ur Screen: NOT DETECTED
METHADONE SCREEN, URINE: NOT DETECTED
Opiate, Ur Screen: NOT DETECTED
Phencyclidine (PCP) Ur S: NOT DETECTED
Tricyclic, Ur Screen: NOT DETECTED

## 2018-12-01 LAB — ECHOCARDIOGRAM COMPLETE
Height: 68 in
Weight: 4317.49 [oz_av]

## 2018-12-01 LAB — PROCALCITONIN: PROCALCITONIN: 0.11 ng/mL

## 2018-12-01 LAB — TRIGLYCERIDES: Triglycerides: 110 mg/dL (ref <150)

## 2018-12-01 MED ORDER — BISACODYL 10 MG RE SUPP
10.0000 mg | Freq: Once | RECTAL | Status: AC
  Administered 2018-12-01: 10 mg via RECTAL

## 2018-12-01 MED ORDER — HYDRALAZINE HCL 20 MG/ML IJ SOLN
10.0000 mg | Freq: Once | INTRAMUSCULAR | Status: AC
  Administered 2018-12-01: 10 mg via INTRAVENOUS
  Filled 2018-12-01: qty 1

## 2018-12-01 MED ORDER — GLYCOPYRROLATE 0.2 MG/ML IJ SOLN
0.1000 mg | Freq: Once | INTRAMUSCULAR | Status: AC
  Administered 2018-12-01: 0.1 mg via INTRAVENOUS

## 2018-12-01 MED ORDER — ORAL CARE MOUTH RINSE
15.0000 mL | Freq: Two times a day (BID) | OROMUCOSAL | Status: DC
  Administered 2018-12-01 – 2018-12-02 (×4): 15 mL via OROMUCOSAL

## 2018-12-01 MED ORDER — PHENOL 1.4 % MT LIQD
1.0000 | OROMUCOSAL | Status: DC | PRN
  Filled 2018-12-01: qty 177

## 2018-12-01 NOTE — Progress Notes (Signed)
Patient extubated and placed on HFNC 40lpm at 50%.  Patient tolerated well HR 80 RR 14 oxygen saturations 100%.

## 2018-12-01 NOTE — Progress Notes (Signed)
CRITICAL CARE NOTE  CC  Severe resp failure  SUBJECTIVE Extensive b/l wheezing Failed SAT/SBT duie to severe COPD Remains critically ill Full vent support Increased WOB Severe air trapping    SIGNIFICANT EVENTS 12/15 intubated, failed biPAP 12/16 remains on vent 12/17 on vent, wheezing 12/18 severe wheezing and air trapping, unable to wean from vent 12/19 severe wheezing, air trapping, not able to wean from vent today 12/20 severe wheezing, air trappinig,  12/21 severe wheezing, air trapping, vent changes made RR increased to 12  BP (!) 145/89   Pulse 68   Temp 98.1 F (36.7 C) (Axillary)   Resp 14   Ht 5\' 8"  (1.727 m)   Wt 122.4 kg   SpO2 100%   BMI 41.03 kg/m    REVIEW OF SYSTEMS  PATIENT IS UNABLE TO PROVIDE COMPLETE REVIEW OF SYSTEMS DUE TO SEVERE CRITICAL ILLNESS   PHYSICAL EXAMINATION:  GENERAL:critically ill appearing, +resp distress HEAD: Normocephalic, atraumatic.  EYES: Pupils equal, round, reactive to light.  No scleral icterus.  MOUTH: Moist mucosal membrane. NECK: Supple. No thyromegaly. No nodules. No JVD.  PULMONARY: +rhonchi, +wheezing CARDIOVASCULAR: S1 and S2. Regular rate and rhythm. No murmurs, rubs, or gallops.  GASTROINTESTINAL: Soft, nontender, -distended. No masses. Positive bowel sounds. No hepatosplenomegaly.  MUSCULOSKELETAL: No swelling, clubbing, or edema.  NEUROLOGIC: obtunded SKIN:intact,warm,dry         Indwelling Urinary Catheter continued, requirement due to   Reason to continue Indwelling Urinary Catheter for strict Intake/Output monitoring for hemodynamic instability   Central Line continued, requirement due to   Reason to continue [employer] Monitoring of central venous pressure or other hemodynamic parameters   Ventilator continued, requirement due to, resp failure    Ventilator Sedation RASS 0 to -2      ASSESSMENT AND PLAN SYNOPSIS  SEVERE COPD EXACERBATION -continue IV steroids as  prescribed -continue NEB THERAPY as prescribed -morphine as needed -wean fio2 as needed and tolerated    Severe Hypoxic and Hypercapnic Respiratory Failure -continue Mechanical Ventilator support -continue Bronchodilator Therapy -Wean Fio2 and PEEP as tolerated -VAP/VENT bundle implementation Plan for SAT/SBT when family arrives    NEUROLOGY - intubated and sedated - minimal sedation to achieve a RASS goal: -1   ELECTROLYTES -follow labs as needed -replace as needed -pharmacy consultation and following    DVT/GI PRX ordered TRANSFUSIONS AS NEEDED MONITOR FSBS ASSESS the need for LABS as needed    GI/Nutrition GI PROPHYLAXIS as indicated DIET-->TF's as tolerated Constipation protocol as indicated    Critical Care Time devoted to patient care services described in this note is 34 minutes.   Overall, patient is critically ill, prognosis is guarded.  Patient with Multiorgan failure and at high risk for cardiac arrest and death.    Lucie LeatherKurian David Takiera Mayo, M.D.  [facility]  Medical Director [facility] Medical Director [facility]

## 2018-12-01 NOTE — Progress Notes (Signed)
During WUA, pt alert, FC, calm despite coughing. Passed SBT. Extuabted to HFNC. Tolerating well. Family at bedside. Daughter asking if pt may have water. Educated family that as pt was just extubated, pt needs to remain NPO for time being to ensure that pt does not aspirate. Family verbalized understanding.

## 2018-12-01 NOTE — Progress Notes (Signed)
[facility] - [place] at [facility]      PATIENT NAME: Samantha SchimkeDeffiney Ciaravino    MR#:  [id number]  DATE OF BIRTH:  [birth year]  SUBJECTIVE:   Extubated this am. Wants water. Aunts in the room  REVIEW OF SYSTEMS:    Review of Systems  Constitutional: Negative for chills, fever and weight loss.  HENT: Negative for ear discharge, ear pain and nosebleeds.   Eyes: Negative for blurred vision, pain and discharge.  Respiratory: Positive for shortness of breath. Negative for sputum production, wheezing and stridor.   Cardiovascular: Negative for chest pain, palpitations, orthopnea and PND.  Gastrointestinal: Negative for abdominal pain, diarrhea, nausea and vomiting.  Genitourinary: Negative for frequency and urgency.  Musculoskeletal: Negative for back pain and joint pain.  Neurological: Negative for sensory change, speech change, focal weakness and weakness.  Psychiatric/Behavioral: Negative for depression and hallucinations. The patient is not nervous/anxious.    Tolerating PT: Await Eval   DRUG ALLERGIES:   Allergies  Allergen Reactions  . Percocet [Oxycodone-Acetaminophen] Itching    VITALS:  Blood pressure (!) 150/103, pulse 95, temperature 98.3 F (36.8 C), temperature source Oral, resp. rate 18, height 5\' 8"  (1.727 m), weight 122.4 kg, SpO2 100 %.  PHYSICAL EXAMINATION:   Physical Exam  GENERAL:  52 y.o.-year-old patient lying in bed sedated & Intubated.  EYES: Pupils equal, round, reactive to light and accommodation. No scleral icterus. Extraocular muscles intact.  HEENT: Head atraumatic, normocephalic. NECK:  Supple, no jugular venous distention. No thyroid enlargement, no tenderness.  LUNGS: Normal breath sounds bilaterally, diffuse end-exp. Wheezing b/l, No rales, rhonchi. No use of accessory muscles of respiration.  CARDIOVASCULAR: S1, S2 normal. No murmurs, rubs, or gallops.  ABDOMEN: Soft, nontender, nondistended. Bowel sounds present. No organomegaly  or mass.  EXTREMITIES: No cyanosis, clubbing or edema b/l.    NEUROLOGIC: moves all extremities well. No focal deficits.   PSYCHIATRIC: alert and oriented x3 SKIN: No obvious rash, lesion, or ulcer.    LABORATORY PANEL:   CBC Recent Labs  Lab 12/01/18 0450  WBC 8.8  HGB 12.4  HCT 40.5  PLT 255   ------------------------------------------------------------------------------------------------------------------  Chemistries  Recent Labs  Lab 11/30/18 0429 12/01/18 0450  NA 144 147*  K 5.1 4.8  CL 104 102  CO2 34* 38*  GLUCOSE 213* 178*  BUN 61* 76*  CREATININE 1.16* 1.12*  CALCIUM 9.3 9.6  MG 2.9*  --   AST  --  24  ALT  --  54*  ALKPHOS  --  60  BILITOT  --  0.4   ------------------------------------------------------------------------------------------------------------------  Cardiac Enzymes Recent Labs  Lab 11/25/18 0217  TROPONINI <0.03   ------------------------------------------------------------------------------------------------------------------  RADIOLOGY:  Dg Chest Port 1 View  Result Date: 12/01/2018 CLINICAL DATA:  Acute respiratory failure. EXAM: PORTABLE CHEST 1 VIEW COMPARISON:  11/29/2018 FINDINGS: Endotracheal tube and right-sided PICC line unchanged. Nasogastric tube courses into the region of the stomach and off the inferior portion of the film as tip is not visualized. Lungs are adequately inflated with stable subtle bibasilar density likely atelectasis. Cardiomediastinal silhouette and remainder of the exam is unchanged. IMPRESSION: Stable subtle hazy bibasilar density likely atelectasis. Tubes and lines unchanged. Electronically Signed   By: Elberta Fortisaniel  Boyle M.D.   On: 12/01/2018 08:06     ASSESSMENT AND PLAN:   52 year old female with past medical history of COPD, hypertension, diabetes, obesity who presented to the hospital due to shortness of breath and noted to be in acute on chronic respiratory  failure with hypoxia and  hypercapnia.  1.  Acute on chronic respiratory failure with hypoxia and hypercapnia-secondary to COPD exacerbation. -Patient now extubated.   -on HFNC  2.  COPD exacerbation-source of patient's worsening respiratory failure with hypoxia and hypercapnia. - Patient remains wheezy and bronchospastic.   - Continue IV steroids, scheduled duo nebs, Pulmicort nebs.  Continue empiric Zithromax. -Continue vent care as per intensivist.  3.  Diabetes type 2 without complication- continue sliding scale insulin.   - BS stable  4. Neuropathy - cont. Gabapentin.   5. Acute Kidney Injury - likely ATN.  - resolved with IV fluid hydration. Cr. At baseline now.     D/w pt and family  All the records are reviewed and case discussed with Care Management/Social Worker. Management plans discussed with the patient, family and they are in agreement.  CODE STATUS: Full code  DVT Prophylaxis: Lovenox  TOTAL TIME TAKING CARE OF THIS PATIENT: 30 minutes.   POSSIBLE D/C unclear, DEPENDING ON CLINICAL CONDITION.   Enedina FinnerSona Martie Muhlbauer M.D on 12/01/2018 at 3:08 PM  Between 7am to 6pm - Pager - [phone]  After 6pm go to [email or web address] - Therapist, nutritionalpassword EPAS [facility]  [facility] [place] [facility]  Office  [phone]  CC: Primary care physician; [facility] [employer]

## 2018-12-01 NOTE — Progress Notes (Signed)
Pharmacy Electrolyte Monitoring Consult:  Pharmacy consulted to assist in monitoring and replacing electrolytes in this 52 y.o. female admitted on 11/25/2018 with COPD exacerbation.   Patient's sedation regimen currently includes fentanyl and propofol infusions.   Labs:  Sodium (mmol/L)  Date Value  12/01/2018 147 (H)  03/10/2015 140   Potassium (mmol/L)  Date Value  12/01/2018 4.8  03/10/2015 3.8   Magnesium (mg/dL)  Date Value  40/98/119112/20/2019 2.9 (H)  06/29/2014 1.9   Phosphorus (mg/dL)  Date Value  47/82/956212/20/2019 3.5   Calcium (mg/dL)  Date Value  13/08/657812/21/2019 9.6   Calcium, Total (mg/dL)  Date Value  46/96/295203/29/2016 9.0   Albumin (g/dL)  Date Value  84/13/244012/21/2019 3.2 (L)  12/07/2014 3.3 (L)    Assessment/Plan: 1. Electrolytes: WNL No replacement warranted.  Will obtain BMP with am labs.   2. Constipation: Last documented bowel movement 12/17. Will increase senna/docusate  2 tab VT BID and Miralax VT Daily. If no bowel movement by 12/21, will order bisacody suppository. Will continue to monitor.   3. Glucose: Methylprednisolone 40mg  IV Q8hr. Continue Hyperglycemia protocol SSI Q4hr.   Pharmacy will continue to monitor and adjust per consult.   Savannah Morford A 12/01/2018 9:03 AM

## 2018-12-02 LAB — BASIC METABOLIC PANEL
Anion gap: 8 (ref 5–15)
BUN: 58 mg/dL — ABNORMAL HIGH (ref 6–20)
CO2: 37 mmol/L — ABNORMAL HIGH (ref 22–32)
Calcium: 9.7 mg/dL (ref 8.9–10.3)
Chloride: 101 mmol/L (ref 98–111)
Creatinine, Ser: 1.03 mg/dL — ABNORMAL HIGH (ref 0.44–1.00)
GFR calc non Af Amer: >60 mL/min (ref >60)
Glucose, Bld: 180 mg/dL — ABNORMAL HIGH (ref 70–99)
Potassium: 4.1 mmol/L (ref 3.5–5.1)
Sodium: 146 mmol/L — ABNORMAL HIGH (ref 135–145)

## 2018-12-02 LAB — GLUCOSE, CAPILLARY
Glucose-Capillary: 132 mg/dL — ABNORMAL HIGH (ref 70–99)
Glucose-Capillary: 134 mg/dL — ABNORMAL HIGH (ref 70–99)
Glucose-Capillary: 137 mg/dL — ABNORMAL HIGH (ref 70–99)
Glucose-Capillary: 143 mg/dL — ABNORMAL HIGH (ref 70–99)
Glucose-Capillary: 146 mg/dL — ABNORMAL HIGH (ref 70–99)
Glucose-Capillary: 181 mg/dL — ABNORMAL HIGH (ref 70–99)
Glucose-Capillary: 94 mg/dL (ref 70–99)

## 2018-12-02 LAB — PROCALCITONIN: Procalcitonin: <0.1 ng/mL

## 2018-12-02 MED ORDER — ONDANSETRON HCL 4 MG/2ML IJ SOLN
4.0000 mg | INTRAMUSCULAR | Status: DC | PRN
  Administered 2018-12-02 (×2): 4 mg via INTRAVENOUS
  Filled 2018-12-02 (×2): qty 2

## 2018-12-02 NOTE — Progress Notes (Signed)
PT Cancellation Note  Patient Details Name: Samantha [name] MRN: [id number] DOB: 08/11/1966   Cancelled Treatment:    Reason Eval/Treat Not Completed: Other (comment).  Pt was unable to work with PT and very nearly tearful about the request.  Agreed to let PT come see her in the AM.   Ivar DrapeRuth E Doris Mcgilvery 12/02/2018, 2:02 PM   Samul Dadauth Shamell Hittle, PT MS Acute Rehab Dept. Number: [facility] [phone] and [facility] [phone]

## 2018-12-02 NOTE — Progress Notes (Signed)
Pharmacy Electrolyte Monitoring Consult:  Pharmacy consulted to assist in monitoring and replacing electrolytes in this 52 y.o. female admitted on 11/25/2018 with COPD exacerbation.   Patient's sedation regimen currently includes fentanyl and propofol infusions.   Labs:  Sodium (mmol/L)  Date Value  12/02/2018 146 (H)  03/10/2015 140   Potassium (mmol/L)  Date Value  12/02/2018 4.1  03/10/2015 3.8   Magnesium (mg/dL)  Date Value  16/10/960412/20/2019 2.9 (H)  06/29/2014 1.9   Phosphorus (mg/dL)  Date Value  54/09/811912/20/2019 3.5   Calcium (mg/dL)  Date Value  14/78/295612/22/2019 9.7   Calcium, Total (mg/dL)  Date Value  21/30/865703/29/2016 9.0   Albumin (g/dL)  Date Value  84/69/629512/21/2019 3.2 (L)  12/07/2014 3.3 (L)    Assessment/Plan: 1. Electrolytes: WNL No replacement warranted.  Will obtain BMP with am labs.   2. Constipation: Last documented bowel movement 12/17. Will increase senna/docusate  2 tab VT BID and Miralax VT Daily. + BM 12/22  3. Glucose: Methylprednisolone 40mg  IV Q8hr. Continue Hyperglycemia protocol SSI Q4hr. Pt NPO  Pharmacy will continue to monitor and adjust per consult.   Farryn Linares A 12/02/2018 9:30 AM

## 2018-12-02 NOTE — Progress Notes (Signed)
[facility] - [place] [facility]      PATIENT NAME: Dallas SchimkeDeffiney Zeoli    MR#:  [id number]  DATE OF BIRTH:  April 02, 1966  SUBJECTIVE:   Staying well. No issues issues per RN.  REVIEW OF SYSTEMS:    Review of Systems  Constitutional: Negative for chills, fever and weight loss.  HENT: Negative for ear discharge, ear pain and nosebleeds.   Eyes: Negative for blurred vision, pain and discharge.  Respiratory: Positive for shortness of breath. Negative for sputum production, wheezing and stridor.   Cardiovascular: Negative for chest pain, palpitations, orthopnea and PND.  Gastrointestinal: Negative for abdominal pain, diarrhea, nausea and vomiting.  Genitourinary: Negative for frequency and urgency.  Musculoskeletal: Negative for back pain and joint pain.  Neurological: Negative for sensory change, speech change, focal weakness and weakness.  Psychiatric/Behavioral: Negative for depression and hallucinations. The patient is not nervous/anxious.    Tolerating PT: Await Eval   DRUG ALLERGIES:   Allergies  Allergen Reactions  . Percocet [Oxycodone-Acetaminophen] Itching    VITALS:  Blood pressure 129/71, pulse 70, temperature 98.6 F (37 C), temperature source Oral, resp. rate 14, height 5\' 8"  (1.727 m), weight 122.6 kg, SpO2 98 %.  PHYSICAL EXAMINATION:   Physical Exam  GENERAL:  52 y.o.-year-old patient lying in bed sedated & Intubated.  EYES: Pupils equal, round, reactive to light and accommodation. No scleral icterus. Extraocular muscles intact.  HEENT: Head atraumatic, normocephalic. NECK:  Supple, no jugular venous distention. No thyroid enlargement, no tenderness.  LUNGS: Normal breath sounds bilaterally, diffuse end-exp. Wheezing b/l, No rales, rhonchi. No use of accessory muscles of respiration.  CARDIOVASCULAR: S1, S2 normal. No murmurs, rubs, or gallops.  ABDOMEN: Soft, nontender, nondistended. Bowel sounds present. No organomegaly or mass.    EXTREMITIES: No cyanosis, clubbing or edema b/l.    NEUROLOGIC: moves all extremities well. No focal deficits.   PSYCHIATRIC: alert and oriented x3 SKIN: No obvious rash, lesion, or ulcer.    LABORATORY PANEL:   CBC Recent Labs  Lab 12/01/18 0450  WBC 8.8  HGB 12.4  HCT 40.5  PLT 255   ------------------------------------------------------------------------------------------------------------------  Chemistries  Recent Labs  Lab 11/30/18 0429 12/01/18 0450 12/02/18 0417  NA 144 147* 146*  K 5.1 4.8 4.1  CL 104 102 101  CO2 34* 38* 37*  GLUCOSE 213* 178* 180*  BUN 61* 76* 58*  CREATININE 1.16* 1.12* 1.03*  CALCIUM 9.3 9.6 9.7  MG 2.9*  --   --   AST  --  24  --   ALT  --  54*  --   ALKPHOS  --  60  --   BILITOT  --  0.4  --    ------------------------------------------------------------------------------------------------------------------  Cardiac Enzymes No results for input(s): TROPONINI in the last 168 hours. ------------------------------------------------------------------------------------------------------------------  RADIOLOGY:  Dg Chest Port 1 View  Result Date: 12/01/2018 CLINICAL DATA:  Acute respiratory failure. EXAM: PORTABLE CHEST 1 VIEW COMPARISON:  11/29/2018 FINDINGS: Endotracheal tube and right-sided PICC line unchanged. Nasogastric tube courses into the region of the stomach and off the inferior portion of the film as tip is not visualized. Lungs are adequately inflated with stable subtle bibasilar density likely atelectasis. Cardiomediastinal silhouette and remainder of the exam is unchanged. IMPRESSION: Stable subtle hazy bibasilar density likely atelectasis. Tubes and lines unchanged. Electronically Signed   By: Elberta Fortisaniel  Boyle M.D.   On: 12/01/2018 08:06     ASSESSMENT AND PLAN:   52 year old female with past medical history of COPD,  hypertension, diabetes, obesity who presented to the hospital due to shortness of breath and noted to be  in acute on chronic respiratory failure with hypoxia and hypercapnia.  1.  Acute on chronic respiratory failure with hypoxia and hypercapnia-secondary to COPD exacerbation. -Patient now extubated.   -on [place] oxygen  2.  COPD exacerbation-source of patient's worsening respiratory failure with hypoxia and hypercapnia. - Patient remains wheezy and bronchospastic.   - Continue IV steroids, scheduled duo nebs, Pulmicort nebs.  Continue empiric Zithromax. -Continue vent care as per intensivist.  3.  Diabetes type 2 without complication- continue sliding scale insulin.   - BS stable  4. Neuropathy - cont. Gabapentin.   5. Acute Kidney Injury - likely ATN.  - resolved with IV fluid hydration. Cr. At baseline now.     D/w pt and family  All the records are reviewed and case discussed with Care Management/Social Worker. Management plans discussed with the patient, family and they are in agreement.  CODE STATUS: Full code  DVT Prophylaxis: Lovenox  TOTAL TIME TAKING CARE OF THIS PATIENT: 30 minutes.   POSSIBLE D/C unclear, DEPENDING ON CLINICAL CONDITION.   Enedina FinnerSona Prentiss Hammett M.D on 12/02/2018 at 1:42 PM  Between 7am to 6pm - Pager - [phone]  After 6pm go to [email or web address] - Therapist, nutritionalpassword EPAS [facility]  [facility] [place] [facility]  Office  [phone]  CC: Primary care physician; [facility], [employer]

## 2018-12-02 NOTE — Progress Notes (Signed)
CRITICAL CARE NOTE  CC  Follow up resp failure    SUBJECTIVE Some wheezing this AM Alert and awake Extubated Transition to [place]    SIGNIFICANT EVENTS 12/15 intubated, failed biPAP 12/16 remains on vent 12/17 on vent, wheezing 12/18 severe wheezing and air trapping, unable to wean from vent 12/19 severe wheezing, air trapping, not able to wean from vent today 12/20 severe wheezing, air trappinig,  12/21 severe wheezing, air trapping, patient extubated 12/22 some wheezing, transition to [place]  BP (!) 145/92   Pulse 71   Temp 98.7 F (37.1 C) (Oral)   Resp 15   Ht 5\' 8"  (1.727 m)   Wt 122.6 kg   SpO2 99%   BMI 41.10 kg/m    Review of Systems:  Gen:  Denies  fever, sweats, chills weigh loss  HEENT: Denies blurred vision, double vision, ear pain, eye pain, hearing loss, nose bleeds, sore throat Cardiac:  No dizziness, chest pain or heaviness, chest tightness,edema, No JVD Resp:   No cough, -sputum production, +shortness of breath,+wheezing, -hemoptysis,  Gi: Denies swallowing difficulty, stomach pain, nausea or vomiting, diarrhea, constipation, bowel incontinence Gu:  Denies bladder incontinence, burning urine Ext:   Denies Joint pain, stiffness or swelling Skin: Denies  skin rash, easy bruising or bleeding or hives Endoc:  Denies polyuria, polydipsia , polyphagia or weight change Psych:   Denies depression, insomnia or hallucinations  Other:  All other systems negative        ASSESSMENT AND PLAN SYNOPSIS  SEVERE COPD EXACERBATION-extubated, slowly improving -continue IV steroids as prescribed -continue NEB THERAPY as prescribed -morphine as needed -wean fio2 as needed and tolerated  Severe Hypoxic and Hypercapnic Respiratory Failure-resolving High flow [place] Transition to [place]  ELECTROLYTES -follow labs as needed -replace as needed -pharmacy consultation and following    DVT/GI PRX ordered TRANSFUSIONS AS NEEDED MONITOR FSBS ASSESS the need for LABS as  needed    Lucie LeatherKurian David Cheney Ewart, M.D.  [facility]  Medical Director [facility] Medical Director [facility] Cardio-Pulmonary Department

## 2018-12-03 LAB — BLOOD GAS, ARTERIAL
Acid-base deficit: 6.7 mmol/L — ABNORMAL HIGH (ref 0.0–2.0)
Bicarbonate: 23.3 mmol/L (ref 20.0–28.0)
FIO2: 0.4
MECHVT: 450 mL
O2 Saturation: 98.2 %
PEEP: 5 cmH2O
Patient temperature: 37
Pressure support: 20 cmH2O
RATE: 12 resp/min
pCO2 arterial: 67 mmHg (ref 32.0–48.0)
pH, Arterial: 7.15 — CL (ref 7.350–7.450)
pO2, Arterial: 134 mmHg — ABNORMAL HIGH (ref 83.0–108.0)

## 2018-12-03 LAB — BASIC METABOLIC PANEL
Anion gap: 8 (ref 5–15)
BUN: 50 mg/dL — ABNORMAL HIGH (ref 6–20)
CALCIUM: 8.8 mg/dL — AB (ref 8.9–10.3)
CO2: 36 mmol/L — ABNORMAL HIGH (ref 22–32)
Chloride: 98 mmol/L (ref 98–111)
Creatinine, Ser: 1.01 mg/dL — ABNORMAL HIGH (ref 0.44–1.00)
GFR calc Af Amer: >60 mL/min (ref >60)
Glucose, Bld: 178 mg/dL — ABNORMAL HIGH (ref 70–99)
Potassium: 3.9 mmol/L (ref 3.5–5.1)
Sodium: 142 mmol/L (ref 135–145)

## 2018-12-03 LAB — PROCALCITONIN: Procalcitonin: <0.1 ng/mL

## 2018-12-03 LAB — GLUCOSE, CAPILLARY
Glucose-Capillary: 170 mg/dL — ABNORMAL HIGH (ref 70–99)
Glucose-Capillary: 162 mg/dL — ABNORMAL HIGH (ref 70–99)
Glucose-Capillary: 195 mg/dL — ABNORMAL HIGH (ref 70–99)
Glucose-Capillary: 149 mg/dL — ABNORMAL HIGH (ref 70–99)
Glucose-Capillary: 92 mg/dL (ref 70–99)

## 2018-12-03 MED ORDER — MONTELUKAST SODIUM 10 MG PO TABS
10.0000 mg | ORAL_TABLET | Freq: Every day | ORAL | Status: DC
  Administered 2018-12-03: 10 mg via ORAL
  Filled 2018-12-03: qty 1

## 2018-12-03 MED ORDER — INSULIN ASPART 100 UNIT/ML ~~LOC~~ SOLN: 0.0000 [IU] | Freq: Every day | SUBCUTANEOUS | Status: DC

## 2018-12-03 MED ORDER — PREDNISONE 50 MG PO TABS
50.0000 mg | ORAL_TABLET | Freq: Every day | ORAL | Status: DC
  Administered 2018-12-03 – 2018-12-04 (×2): 50 mg via ORAL
  Filled 2018-12-03 (×2): qty 1

## 2018-12-03 MED ORDER — GABAPENTIN 300 MG PO CAPS
300.0000 mg | ORAL_CAPSULE | Freq: Two times a day (BID) | ORAL | Status: DC
  Administered 2018-12-03 – 2018-12-04 (×2): 300 mg via ORAL
  Filled 2018-12-03 (×2): qty 1

## 2018-12-03 MED ORDER — INSULIN ASPART 100 UNIT/ML ~~LOC~~ SOLN: 0.0000 [IU] | Freq: Three times a day (TID) | SUBCUTANEOUS | Status: DC

## 2018-12-03 MED ORDER — METFORMIN HCL 850 MG PO TABS
850.0000 mg | ORAL_TABLET | Freq: Two times a day (BID) | ORAL | Status: DC
  Administered 2018-12-03 – 2018-12-04 (×3): 850 mg via ORAL
  Filled 2018-12-03 (×4): qty 1

## 2018-12-03 MED ORDER — TIOTROPIUM BROMIDE MONOHYDRATE 18 MCG IN CAPS
18.0000 ug | ORAL_CAPSULE | Freq: Every day | RESPIRATORY_TRACT | Status: DC
  Administered 2018-12-03 – 2018-12-04 (×2): 18 ug via RESPIRATORY_TRACT
  Filled 2018-12-03: qty 5

## 2018-12-03 MED ORDER — ADULT MULTIVITAMIN LIQUID CH
15.0000 mL | Freq: Every day | ORAL | Status: DC
  Administered 2018-12-03: 15 mL via ORAL
  Filled 2018-12-03: qty 15

## 2018-12-03 MED ORDER — INSULIN ASPART 100 UNIT/ML ~~LOC~~ SOLN
SUBCUTANEOUS | Status: AC
  Filled 2018-12-03: qty 1

## 2018-12-03 MED ORDER — HYDROCHLOROTHIAZIDE 12.5 MG PO CAPS
12.5000 mg | ORAL_CAPSULE | Freq: Every day | ORAL | Status: DC
  Administered 2018-12-03 – 2018-12-04 (×2): 12.5 mg via ORAL
  Filled 2018-12-03 (×2): qty 1

## 2018-12-03 MED ORDER — LOSARTAN POTASSIUM-HCTZ 50-12.5 MG PO TABS: 1.0000 | ORAL_TABLET | Freq: Every day | ORAL | Status: DC

## 2018-12-03 MED ORDER — MOMETASONE FURO-FORMOTEROL FUM 200-5 MCG/ACT IN AERO
2.0000 | INHALATION_SPRAY | Freq: Two times a day (BID) | RESPIRATORY_TRACT | Status: DC
  Administered 2018-12-03 – 2018-12-04 (×3): 2 via RESPIRATORY_TRACT
  Filled 2018-12-03: qty 8.8

## 2018-12-03 MED ORDER — LOSARTAN POTASSIUM 50 MG PO TABS
50.0000 mg | ORAL_TABLET | Freq: Every day | ORAL | Status: DC
  Administered 2018-12-03 – 2018-12-04 (×2): 50 mg via ORAL
  Filled 2018-12-03 (×2): qty 1

## 2018-12-03 MED ORDER — INSULIN ASPART 100 UNIT/ML ~~LOC~~ SOLN
0.0000 [IU] | Freq: Three times a day (TID) | SUBCUTANEOUS | Status: DC
  Administered 2018-12-03: 3 [IU] via SUBCUTANEOUS
  Administered 2018-12-03: 2 [IU] via SUBCUTANEOUS
  Administered 2018-12-03: 3 [IU] via SUBCUTANEOUS
  Filled 2018-12-03 (×2): qty 1

## 2018-12-03 MED ORDER — FAMOTIDINE 20 MG PO TABS
20.0000 mg | ORAL_TABLET | Freq: Every day | ORAL | Status: DC
  Administered 2018-12-03 – 2018-12-04 (×2): 20 mg via ORAL
  Filled 2018-12-03 (×2): qty 1

## 2018-12-03 MED ORDER — GLIMEPIRIDE 4 MG PO TABS
4.0000 mg | ORAL_TABLET | Freq: Every day | ORAL | Status: DC
  Administered 2018-12-03 – 2018-12-04 (×2): 4 mg via ORAL
  Filled 2018-12-03 (×2): qty 1

## 2018-12-03 MED ORDER — PREMIER PROTEIN SHAKE
11.0000 [oz_av] | Freq: Three times a day (TID) | ORAL | Status: DC
  Administered 2018-12-03 (×2): 11 [oz_av] via ORAL

## 2018-12-03 NOTE — Care Management Note (Signed)
Case Management Note  Patient Details  Name: Samantha [name] MRN: [id number] Date of Birth: 02/06/1966  Subjective/Objective:    Patient qualifies for NIV.  Dr. Allena KatzPatel is in agreement with ordering NIV through [facility].  Order placed in chart, MD will sign tomorrow.    Patient will have no copay and she is in agreement to accept the NIV.  Feliberto GottronJason Hinton with [facility] aware.  Robbie LisJeanna Tracer Gutridge RN BSN              Action/Plan:   Expected Discharge Date:                  Expected Discharge Plan:  Home w Home Health Services  In-House Referral:     Discharge planning Services  CM Consult  Post Acute Care Choice:  Durable Medical Equipment, Home Health Choice offered to:  Patient  DME Arranged:  [place] rolling DME Agency:  [facility]  HH Arranged:  PT, OT, Nurse's Aide HH Agency:  [facility]  Status of Service:  In process, will continue to follow  If discussed at Long Length of Stay Meetings, dates discussed:    Additional Comments:  Allayne ButcherJeanna M Revia Nghiem, RN 12/03/2018, 4:16 PM

## 2018-12-03 NOTE — Care Management Important Message (Signed)
Important Message  Patient Details  Name: Samantha [name] A Legan MRN: [id number] Date of Birth: March 24, 1966   Medicare Important Message Given:  Yes    Olegario MessierKathy A Terence Bart 12/03/2018, 11:49 AM

## 2018-12-03 NOTE — Care Management Note (Signed)
Case Management Note  Patient Details  Name: Kaleen OdeaDeffiney A Droll MRN: [id number] Date of Birth: [birth year]  Subjective/Objective:                  Patient admitted with acute respiratory failure with hypoxemia.  Patient was extubated on 12/21.  Patient transferred to 1A earlier this morning tolerating [place].  Patient is on chronic O2 at home at night, oxygen company is [employer].  PT recommended SNF, patient refuses SNF but is okay with Home Health.  Choice offered and patient chooses [facility]- PT recommends RW with 5" wheels.  Patient expected discharge tomorrow 12/24.   Robbie LisJeanna Paisleigh Maroney RN BSN [phone]  Action/Plan:   Expected Discharge Date:                  Expected Discharge Plan:  Home w Home Health Services  In-House Referral:     Discharge planning Services  CM Consult  Post Acute Care Choice:  Durable Medical Equipment, Home Health Choice offered to:  Patient  DME Arranged:  [place] rolling DME Agency:  [facility]  HH Arranged:  PT, OT, Nurse's Aide HH Agency:  [facility]  Status of Service:  In process, will continue to follow  If discussed at Long Length of Stay Meetings, dates discussed:    Additional Comments:  Allayne ButcherJeanna M Jerry Haugen, RN 12/03/2018, 3:08 PM

## 2018-12-03 NOTE — Clinical Social Work Note (Addendum)
Clinical Social Work Assessment  Patient Details  Name: Samantha [name] MRN: [id number] Date of Birth: May 24, 1966  Date of referral:  12/03/18               Reason for consult:  Facility Placement                Permission sought to share information with:    Permission granted to share information::     Name::        Agency::     Relationship::     Contact Information:     Housing/Transportation Living arrangements for the past 2 months:  Single Family Home Source of Information:  Patient Patient Interpreter Needed:  None Criminal Activity/Legal Involvement Pertinent to Current Situation/Hospitalization:  No - Comment as needed Significant Relationships:  Adult Children, Siblings Lives with:  Adult Children, Siblings Do you feel safe going back to the place where you live?  Yes Need for family participation in patient care:  Yes (Comment)  Care giving concerns:  Patient lives in [place] with her brother Samantha [name] and daughter Samantha [name].    Social Worker assessment / plan:  Holiday representative (CSW) reviewed chart and noted that PT is recommending SNF. CSW met with patient alone at bedside to discuss D/C plan. Patient was alert and oriented X4 and was sitting up in the chair at bedside. CSW introduced self and explained role of CSW department. Per patient she lives in [place] with her brother Samantha [name] and daughter Samantha [name]. Patient reported that she has 24/7 supervision at home. CSW explained SNF process. Per patient she does not want to go to SNF and wants to D/C home. Patient refused SNF. RN case manager aware of above. CSW contacted patient's daughter Samantha [name] and made her aware of above. Samantha [name] is agreeable for patient to D/C home with home health. CSW will continue to follow and assist as needed.   Employment status:  Disabled (Comment on whether or not currently receiving Disability) Insurance information:  [place]icaid [place], [employer] PT Recommendations:  [place] / Referral to community resources:  Other (Comment Required)(Patient prefers to D/C home with home health. )  Patient/Family's Response to care:  Patient prefers to D/C home.   Patient/Family's Understanding of and Emotional Response to Diagnosis, Current Treatment, and Prognosis:  Patient was very pleasant and thanked CSW for visit.   Emotional Assessment Appearance:  Appears stated age Attitude/Demeanor/Rapport:    Affect (typically observed):  Accepting, Adaptable, Pleasant Orientation:  Oriented to Self, Oriented to Place, Oriented to  Time, Oriented to Situation Alcohol / Substance use:  Not Applicable Psych involvement (Current and /or in the community):  No (Comment)  Discharge Needs  Concerns to be addressed:  Discharge Planning Concerns Readmission within the last 30 days:  No Current discharge risk:  Chronically ill Barriers to Discharge:  Continued Medical Work up   [employer], Veronia Beets, LCSW 12/03/2018, 4:18 PM

## 2018-12-03 NOTE — Progress Notes (Signed)
[employer] [facility] -  at [facility]      PATIENT NAME: Samantha SchimkeDeffiney [name]    MR#:  [id number]  DATE OF BIRTH:  1966-06-09  SUBJECTIVE:   Transferred to the floor last night. Horse voice. Denies any complaints. Feeling weak. Ambulates at home by herself.  REVIEW OF SYSTEMS:    Review of Systems  Constitutional: Negative for chills, fever and weight loss.  HENT: Negative for ear discharge, ear pain and nosebleeds.   Eyes: Negative for blurred vision, pain and discharge.  Respiratory: Positive for shortness of breath. Negative for sputum production, wheezing and stridor.   Cardiovascular: Negative for chest pain, palpitations, orthopnea and PND.  Gastrointestinal: Negative for abdominal pain, diarrhea, nausea and vomiting.  Genitourinary: Negative for frequency and urgency.  Musculoskeletal: Negative for back pain and joint pain.  Neurological: Negative for sensory change, speech change, focal weakness and weakness.  Psychiatric/Behavioral: Negative for depression and hallucinations. The patient is not nervous/anxious.    Tolerating PT: Await Eval   DRUG ALLERGIES:   Allergies  Allergen Reactions  . Percocet [Oxycodone-Acetaminophen] Itching    VITALS:  Blood pressure (!) 143/87, pulse 71, temperature 98.3 F (36.8 C), temperature source Oral, resp. rate 18, height 5\' 8"  (1.727 m), weight 122.2 kg, SpO2 95 %.  PHYSICAL EXAMINATION:   Physical Exam  GENERAL:  52 y.o.-year-old patient lying in bed morbidly obese  EYES: Pupils equal, round, reactive to light and accommodation. No scleral icterus. Extraocular muscles intact.  HEENT: Head atraumatic, normocephalic. NECK:  Supple, no jugular venous distention. No thyroid enlargement, no tenderness.  LUNGS: Normal breath sounds bilaterally No rales, rhonchi. No use of accessory muscles of respiration. Few expiratory wheezing CARDIOVASCULAR: S1, S2 normal. No murmurs, rubs, or gallops.  ABDOMEN: Soft, nontender,  nondistended. Bowel sounds present. No organomegaly or mass.  EXTREMITIES: No cyanosis, clubbing or edema b/l.    NEUROLOGIC: moves all extremities well. No focal deficits.   PSYCHIATRIC: alert and oriented x3 SKIN: No obvious rash, lesion, or ulcer.    LABORATORY PANEL:   CBC Recent Labs  Lab 12/01/18 0450  WBC 8.8  HGB 12.4  HCT 40.5  PLT 255   ------------------------------------------------------------------------------------------------------------------  Chemistries  Recent Labs  Lab 11/30/18 0429 12/01/18 0450  12/03/18 0426  NA 144 147*   < > 142  K 5.1 4.8   < > 3.9  CL 104 102   < > 98  CO2 34* 38*   < > 36*  GLUCOSE 213* 178*   < > 178*  BUN 61* 76*   < > 50*  CREATININE 1.16* 1.12*   < > 1.01*  CALCIUM 9.3 9.6   < > 8.8*  MG 2.9*  --   --   --   AST  --  24  --   --   ALT  --  54*  --   --   ALKPHOS  --  60  --   --   BILITOT  --  0.4  --   --    < > = values in this interval not displayed.   ------------------------------------------------------------------------------------------------------------------  Cardiac Enzymes No results for input(s): TROPONINI in the last 168 hours. ------------------------------------------------------------------------------------------------------------------  RADIOLOGY:  No results found.   ASSESSMENT AND PLAN:   52 year old female with past medical history of COPD, hypertension, diabetes, obesity who presented to the hospital due to shortness of breath and noted to be in acute on chronic respiratory failure with hypoxia and hypercapnia.  1.  Acute  on chronic respiratory failure with hypoxia and hypercapnia-secondary to COPD exacerbation. -Patient now extubated.   -on [place] oxygen-- patient wears oxygen chronically  2.  COPD exacerbation-source of patient's worsening respiratory failure with hypoxia and hypercapnia.  - Continue IV steroids--- change to oral taper, scheduled duo nebs, Pulmicort nebs.     -Procalcitonin times two negative. Antibiotics discontinued  as per intensivist.  3.  Diabetes type 2 without complication- continue sliding scale insulin.   - BS stable -resume metformin and glyburide  4. Neuropathy - cont. Gabapentin.   5. Acute Kidney Injury - likely ATN.  - resolved with IV fluid hydration. Cr. At baseline now.  6. Hypertension resume losartan/hydrochlorothiazide. Creatinine at baseline.     D/w pt   As to ambulate with [facility] today. If remains stable patient will discharged to home tomorrow. Patient is agreeable with the plan. Smoking cessation discussed.  All the records are reviewed and case discussed with Care Management/Social Worker. Management plans discussed with the patient, family and they are in agreement.  CODE STATUS: Full code  DVT Prophylaxis: Lovenox  TOTAL TIME TAKING CARE OF THIS PATIENT: 30 minutes.   POSSIBLE D/C  In AM DEPENDING ON CLINICAL CONDITION.   Enedina FinnerSona Lucifer Soja M.D on 12/03/2018 at 7:58 AM  Between 7am to 6pm - Pager - [phone]  After 6pm go to [email or web address] - Therapist, nutritionalpassword EPAS [facility]  [facility]  Office  [phone]  CC: Primary care physician; [facility]

## 2018-12-03 NOTE — NC FL2 (Signed)
[place] MEDICAID FL2 LEVEL OF CARE SCREENING TOOL     IDENTIFICATION  Patient Name: Samantha [name] Birthdate: [birth year] Sex: female Admission Date (Current Location): 11/25/2018  [facility] [place]  [place] ([id number]) Facility and Address:  [facility], [address], [place], [place] [address]      Provider Number: [id number]  Attending Physician Name and Address:  Enedina FinnerPatel, Sona, MD  Relative Name and Phone Number:       Current Level of Care: Hospital Recommended Level of Care: Skilled Nursing Facility Prior Approval Number:    Date Approved/Denied:   PASRR Number: ([id number])  Discharge Plan: SNF    Current Diagnoses: Patient Active Problem List   Diagnosis Date Noted  . Acute respiratory failure with hypoxemia (HCC) 11/25/2018  . COPD exacerbation (HCC) 02/21/2018  . Acute on chronic respiratory failure (HCC) 10/06/2015  . Acute on chronic respiratory failure with hypoxemia (HCC) 08/30/2015    Orientation RESPIRATION BLADDER Height & Weight     Self, Time, Situation, Place  O2(2 Liters Oxygen. ) Continent Weight: 269 lb 8 oz (122.2 kg) Height:  5\' 8"  (172.7 cm)  BEHAVIORAL SYMPTOMS/MOOD NEUROLOGICAL BOWEL NUTRITION STATUS      Incontinent Diet(Mech Soft diet consistency for conservation of energy; Thin liquids)  AMBULATORY STATUS COMMUNICATION OF NEEDS Skin   Extensive Assist Verbally Normal                       Personal Care Assistance Level of Assistance  Bathing, Feeding, Dressing Bathing Assistance: Limited assistance Feeding assistance: Independent Dressing Assistance: Limited assistance     Functional Limitations Info  Sight, Hearing, Speech Sight Info: Adequate Hearing Info: Adequate Speech Info: Adequate    SPECIAL CARE FACTORS FREQUENCY  PT (By licensed PT), OT (By licensed OT)     PT Frequency: (5) OT Frequency: (5)            Contractures      Additional Factors Info  Code  Status, Allergies Code Status Info: (Full Code. ) Allergies Info: (Percocet Oxycodone-acetaminophen)           Current Medications (12/03/2018):  This is the current hospital active medication list Current Facility-Administered Medications  Medication Dose Route Frequency Provider Last Rate Last Dose  . acetaminophen (TYLENOL) tablet 650 mg  650 mg Per Tube Q6H PRN Erin FullingKasa, Kurian, MD       Or  . acetaminophen (TYLENOL) suppository 650 mg  650 mg Rectal Q6H PRN Erin FullingKasa, Kurian, MD      . enoxaparin (LOVENOX) injection 40 mg  40 mg Subcutaneous Q24H Erin FullingKasa, Kurian, MD   40 mg at 12/02/18 2141  . famotidine (PEPCID) tablet 20 mg  20 mg Oral Daily Enedina FinnerPatel, Sona, MD   20 mg at 12/03/18 0926  . gabapentin (NEURONTIN) capsule 300 mg  300 mg Oral BID Enedina FinnerPatel, Sona, MD      . glimepiride (AMARYL) tablet 4 mg  4 mg Oral Q breakfast Enedina FinnerPatel, Sona, MD   4 mg at 12/03/18 0851  . losartan (COZAAR) tablet 50 mg  50 mg Oral Daily Enedina FinnerPatel, Sona, MD   50 mg at 12/03/18 [id number]   And  . hydrochlorothiazide (MICROZIDE) capsule 12.5 mg  12.5 mg Oral Daily Enedina FinnerPatel, Sona, MD   12.5 mg at 12/03/18 0926  . insulin aspart (novoLOG) injection 0-15 Units  0-15 Units Subcutaneous TID WC Enedina FinnerPatel, Sona, MD   3 Units at 12/03/18 1223  . insulin aspart (novoLOG) injection 0-5 Units  0-5 Units Subcutaneous QHS Enedina FinnerPatel, Sona, MD      . insulin aspart (novoLOG) injection 0-5 Units  0-5 Units Subcutaneous QHS Enedina FinnerPatel, Sona, MD      . ipratropium-albuterol (DUONEB) 0.5-2.5 (3) MG/3ML nebulizer solution 3 mL  3 mL Nebulization Q4H PRN Tukov-Yual, Magdalene S, NP   3 mL at 12/02/18 1933  . MEDLINE mouth rinse  15 mL Mouth Rinse BID Erin FullingKasa, Kurian, MD   15 mL at 12/02/18 2145  . metFORMIN (GLUCOPHAGE) tablet 850 mg  850 mg Oral BID WC Enedina FinnerPatel, Sona, MD   850 mg at 12/03/18 0926  . mometasone-formoterol (DULERA) 200-5 MCG/ACT inhaler 2 puff  2 puff Inhalation BID Enedina FinnerPatel, Sona, MD   2 puff at 12/03/18 1419  . montelukast (SINGULAIR) tablet 10 mg  10 mg Oral QHS  Enedina FinnerPatel, Sona, MD      . ondansetron [facility](ZOFRAN) injection 4 mg  4 mg Intravenous Q4H PRN Erin FullingKasa, Kurian, MD   4 mg at 12/02/18 1627  . phenol (CHLORASEPTIC) mouth spray 1 spray  1 spray Mouth/Throat PRN Erin FullingKasa, Kurian, MD      . polyethylene glycol (MIRALAX / GLYCOLAX) packet 17 g  17 g Per Tube Daily Bertram SavinSimpson, Michael L, RPH   17 g at 12/03/18 0926  . predniSONE (DELTASONE) tablet 50 mg  50 mg Oral Q breakfast Enedina FinnerPatel, Sona, MD   50 mg at 12/03/18 0851  . protein supplement (PREMIER PROTEIN) liquid - approved for s/p bariatric surgery  11 oz Oral TID BM Enedina FinnerPatel, Sona, MD      . QUEtiapine (SEROQUEL) tablet 50 mg  50 mg Per Tube QHS Bertram SavinSimpson, Michael L, RPH   50 mg at 12/02/18 2138  . senna-docusate (Senokot-S) tablet 2 tablet  2 tablet Per Tube BID Bertram SavinSimpson, Michael L, [facility] Division[facility]PH   2 tablet at 12/03/18 [id number]  . sodium chloride flush (NS) 0.9 % injection 10-40 mL  10-40 mL Intracatheter Q12H Auburn BilberryPatel, Shreyang, MD   10 mL at 12/03/18 0928  . sodium chloride flush (NS) 0.9 % injection 10-40 mL  10-40 mL Intracatheter PRN Auburn BilberryPatel, Shreyang, MD      . tiotropium [facility](SPIRIVA) inhalation capsule ([facility] use ONLY) 18 mcg  18 mcg Inhalation Daily Enedina FinnerPatel, Sona, MD   18 mcg at 12/03/18 1419     Discharge Medications: Please see discharge summary for a list of discharge medications.  Relevant Imaging Results:  Relevant Lab Results:   Additional Information (SSN: [id number])  Lula Michaux, Darleen CrockerBailey M, LCSW

## 2018-12-03 NOTE — Progress Notes (Signed)
Pharmacy Electrolyte Monitoring Consult:  Pharmacy consulted to assist in monitoring and replacing electrolytes in this 52 y.o. female admitted on 11/25/2018 with COPD exacerbation.   Patient's sedation regimen currently includes fentanyl and propofol infusions.   Labs:  Sodium (mmol/L)  Date Value  12/03/2018 142  03/10/2015 140   Potassium (mmol/L)  Date Value  12/03/2018 3.9  03/10/2015 3.8   Magnesium (mg/dL)  Date Value  16/10/960412/20/2019 2.9 (H)  06/29/2014 1.9   Phosphorus (mg/dL)  Date Value  54/09/811912/20/2019 3.5   Calcium (mg/dL)  Date Value  14/78/295612/23/2019 8.8 (L)   Calcium, Total (mg/dL)  Date Value  21/30/865703/29/2016 9.0   Albumin (g/dL)  Date Value  84/69/629512/21/2019 3.2 (L)  12/07/2014 3.3 (L)    Assessment/Plan: Electrolytes WNL. Pharmacy will sign off CCM consult. Please re-consult if further assistance is desired.  Carola FrostNathan A Vipul Cafarelli, PharmD, BCPS Clinical Pharmacist 12/03/2018 7:19 AM

## 2018-12-03 NOTE — Evaluation (Signed)
Occupational Therapy Evaluation Patient Details Name: Samantha [name] MRN: [id number] DOB: [birth year] Today's Date: 12/03/2018    History of Present Illness Pt is a 52 y.o. female presenting to hospital 11/25/18 with increased SOB and productive cough.  Pt intubated 11/25/18 and extubated 12/01/18.  Pt admitted with acute hypoxemic respiratory failure related to AECOPD; also noted with hypercapnia.  PMH includes COPD (2 L home O2 at night), DM, htn, acute on chronic respiratory failure with hypoxemia.   Clinical Impression   Pt seen for OT evaluation this date. Pt was independent in all ADLs and mobility, living in a  2 story home with family prior to admission. Pt on 3 liters of O2 at home. Pt reports becoming minimally fatigued/out of breath, requiring assist for entering and exiting the shower. Pt currently requires CGA for ADL/MOBILITY due to poor activity tolerance. Pt eager to use restroom, with decreased safety awareness, on this date. Required consistent verbal cueing in addition to CGA for safety. Pt educated in energy conservation conservation strategies including pursed lip breathing, activity pacing, home/routines modifications, work simplification, AE/DME, prioritizing of meaningful occupations, and falls prevention. Handout provided. Pt verbalized understanding but would benefit from additional skilled OT services to maximize recall and carryover of learned techniques and facilitate implementation of learned techniques into daily routines. Upon discharge, recommend SNF services.       Follow Up Recommendations  SNF    Equipment Recommendations  None recommended by OT    Recommendations for Other Services       Precautions / Restrictions Precautions Precautions: Fall Restrictions Weight Bearing Restrictions: No      Mobility Bed Mobility Overal bed mobility: Needs Assistance Bed Mobility: Supine to Sit     Supine to sit: Min guard;HOB elevated     General bed  mobility comments: increased effort to perform on own  Transfers Overall transfer level: Needs assistance Equipment used: Rolling walker (2 wheeled) Transfers: Sit to/from Stand Sit to Stand: Min guard         General transfer comment: VC for safety, pt mildly impulsive needing to get to restroom.    Balance Overall balance assessment: Needs assistance Sitting-balance support: No upper extremity supported Sitting balance-Leahy Scale: Good Sitting balance - Comments: steady sitting reaching within BOS   Standing balance support: Single extremity supported Standing balance-Leahy Scale: Poor Standing balance comment: pt requiring at least single UE support for static standing balance                           ADL either performed or assessed with clinical judgement   ADL Overall ADL's : Needs assistance/impaired                                       General ADL Comments: Pt requiring CGA for [facility]ulation[facility]. Consistent verbal cuing required to encourage pursed lip breathing throughout use of BSC and transfer back into bed.      Vision Patient Visual Report: No change from baseline       Perception     Praxis      Pertinent Vitals/Pain Pain Assessment: No/denies pain     Hand Dominance     Extremity/Trunk Assessment Upper Extremity Assessment Upper Extremity Assessment: Overall WFL for tasks assessed       Communication Communication Communication: No difficulties   Cognition Arousal/Alertness: Awake/alert Behavior During  Therapy: Impulsive (Pt with urgent need to use the restroom.) Overall Cognitive Status: Within Functional Limits for tasks assessed                                     General Comments  Pt with O2 saturation ranging between 85-95 throughout session. HR in mid 90's. O2 and HR showed improvement when pursed lip breathing and rest breaks during activity.     Exercises Other  Exercises Other Exercises: Provided pt education and handout on energy conservation strategies as well as education on adherence to aspiration precautions and mechanical soft diet per SLP order.    Shoulder Instructions      Home Living Family/patient expects to be discharged to:: Private residence Living Arrangements: Other relatives Available Help at Discharge: Family Type of Home: House Home Access: Stairs to enter Entergy CorporationEntrance Stairs-Number of Steps: 3-4 Entrance Stairs-Rails: Right;Left;Can reach both Home Layout: Two level;Able to live on main level with bedroom/bathroom     Bathroom Shower/Tub: Chief Strategy OfficerTub/shower unit   Bathroom Toilet: Standard     Home Equipment: Grab bars - tub/shower          Prior Functioning/Environment Level of Independence: Independent        Comments: (-) driving; no falls prior to hospital admission; Pt reports having DME in the home including BSC, 2WW & WC.        OT Problem List: Decreased activity tolerance;Impaired balance (sitting and/or standing);Decreased safety awareness;Decreased knowledge of use of DME or AE;Decreased knowledge of precautions;Cardiopulmonary status limiting activity;Decreased strength      OT Treatment/Interventions: Self-care/ADL training;Therapeutic exercise;Energy conservation;DME and/or AE instruction;Therapeutic activities;Patient/family education    OT Goals(Current goals can be found in the care plan section) Acute Rehab OT Goals Patient Stated Goal: to improve walking and strength OT Goal Formulation: With patient Time For Goal Achievement: 12/17/18 Potential to Achieve Goals: Good ADL Goals Pt Will Perform Eating: with supervision Pt Will Perform Lower Body Dressing: with min assist Pt Will Transfer to Toilet: with min assist  OT Frequency: Min 1X/week   Barriers to D/C:            Co-evaluation              AM-PAC OT "6 Clicks" Daily Activity     Outcome Measure Help from another person eating  meals?: None Help from another person taking care of personal grooming?: None Help from another person toileting, which includes using toliet, bedpan, or urinal?: A Little Help from another person bathing (including washing, rinsing, drying)?: A Little Help from another person to put on and taking off regular upper body clothing?: A Little Help from another person to put on and taking off regular lower body clothing?: A Little 6 Click Score: 20   End of Session Equipment Utilized During Treatment: Rolling walker;Gait belt Nurse Communication: Other (comment)(Spoke with RN about pt having/eating food in room that did not adhere to SLP order of mechanical soft diet and not adhering to aspiration precautions (HOB was <30). )  Activity Tolerance: Patient limited by fatigue Patient left: in bed;with call bell/phone within reach;with bed alarm set;with SCD's reapplied  OT Visit Diagnosis: Unsteadiness on feet (R26.81);Other abnormalities of gait and mobility (R26.89)                Time: 1610-96041506-1540 OT Time Calculation (min): 34 min Charges:  OT General Charges $OT Visit: 1 Visit OT Evaluation $OT  Eval Low Complexity: 1 Low OT Treatments $Self Care/Home Management : 23-37 mins  Andora Krull, OTR/L 12/03/18, 4:40 PM

## 2018-12-03 NOTE — Progress Notes (Signed)
Nutrition Follow-up  DOCUMENTATION CODES:   Morbid obesity  INTERVENTION:  Provide Premier Protein po TID, each supplement provides 160 kcal and 30 grams of protein.  Will discontinue order for liquid multivitamin as this was leftover from tube feed orders.  NUTRITION DIAGNOSIS:   Inadequate oral intake related to inability to eat as evidenced by NPO status.  Resolving with diet advancement.  GOAL:   Patient will meet greater than or equal to 90% of their needs  Progressing.  MONITOR:   PO intake, Supplement acceptance, Diet advancement, Labs, Weight trends, I & O's  REASON FOR ASSESSMENT:   Ventilator    ASSESSMENT:   [age] year old female with PMHx of asthma, HTN, DM, COPD who is admitted with severe hypoxic and hypercapnic respiratory failure from COPD exacerbation requiring intubation on 12/15.   -Patient was extubated on 12/21. -Started on clear liquid diet on 12/22. -Advanced to dysphagia 3 with thin liquids today following SLP evaluation  Patient was extubated on 12/21. Diet was advanced to clears on 12/22. Today advanced to dysphagia 3 with thin liquids following SLP evaluation. Patient feels weak. She is able to feed herself but needs some assistance and only took bites of her lunch today. Abdomen distended. Patient had a medium type 2 BM yesterday.  Medications reviewed and include: famotidine, gabapentin, Novolog 0-15 units TID, Novolog 0-5 units QHS, metformin 850 mg BID, liquid MVI daily per tube, prednisone 50 mg daily with breakfast, Seroquel QHS, senna-docusate 2 tablets BID.  Labs reviewed: CBG 132-195, CO2 36, BUN 50, Creatinine 1.01.  Diet Order:   Diet Order            DIET DYS 3 Room service appropriate? Yes with Assist; Fluid consistency: Thin  Diet effective now             EDUCATION NEEDS:   No education needs have been identified at this time  Skin:  Skin Assessment: Reviewed RN Assessment  Last BM:  12/02/2018 -medium type  2  Height:   Ht Readings from Last 1 Encounters:  11/25/18 5\' 8"  (1.727 m)   Weight:   Wt Readings from Last 1 Encounters:  12/03/18 122.2 kg   Ideal Body Weight:  63.6 kg  BMI:  Body mass index is 40.98 kg/m.  Estimated Nutritional Needs:   Kcal:  2100-2300  Protein:  105-115 grams  Fluid:  1.6 L/day (25 mL/kg IBW)  Helane RimaLeanne Anihya Tuma, MS, RD, LDN Office: [phone] Pager: [phone] After Hours/Weekend Pager: [phone]

## 2018-12-03 NOTE — Evaluation (Signed)
Clinical/Bedside Swallow Evaluation Patient Details  Name: Samantha [name] MRN: [id number] Date of Birth: 08-04-1966  Today's Date: 12/03/2018 Time: SLP Start Time (ACUTE ONLY): 0920 SLP Stop Time (ACUTE ONLY): 1020 SLP Time Calculation (min) (ACUTE ONLY): 60 min  Past Medical History:  Past Medical History:  Diagnosis Date  . Acute on chronic respiratory failure with hypoxemia (HCC) 08/30/2015  . Asthma   . COPD (chronic obstructive pulmonary disease) (HCC)   . Diabetes mellitus without complication (HCC)   . Hypertension    Past Surgical History:  Past Surgical History:  Procedure Laterality Date  . none     HPI:   Pt is a 52 y.o. female w/ h/o acute on chronic respiratory failure w/ intubation in past, Asthma, COPD, DM, HNT, obesity, tobacco/ETOH/drug use (cocaine) per chart notes who comes to the emergency department via EMS with roughly 24 hours of rapidly progressive shortness of breath and productive cough.  She has a past medical history of COPD and uses 2 L of nasal cannula at night only.  When EMS arrived she was saturating 88% on room air with elevated work of breathing.  EMS put her on 4 L of nasal cannula and administered 2 DuoNeb's as well as 125 mg of Solu-Medrol with some improvement in her symptoms.  She says that she is exhausted when even taking 2 or 3 steps.  No particular pain.  Denies fevers or chills.  Did not get a flu shot this year.  Symptoms came on gradually were slowly progressive are now severe.  They are non-positional.  She has been intubated once in the past for COPD.  This admission, pt was orally intubated now extubated; A/O x3; easily fatigued and SOB w/ any exertion including lengthy conversation.  Assessment / Plan / Recommendation Clinical Impression  Pt appears to present w/ adequate oropharyngeal phase swallowing function w/ no overt oropharyngeal phase deficits noted; no overt s/s of aspiration noted. Pt does have decline Pulmonary status post  recent oral intubation/extubation and respiratory illness d/t SEVERE COPD EXACERBATION per MD notes. Pt exhibits min increased inspiratory effort, wheeze if taking multiple sips or bites in a row; this decreased when she took 1-2 sips or bites then a rest break before the next bolus. During po trials, no overt s/s of aspiration followed the bolus intake; no overt coughing or throat clearing, vocal quality was raspy but not wet. Pharyngeal swallows were timely and appeared complete w/out multiple swallows following. Oral phase appeared grossly wfl for timeliness of bolus mastication/management for A-P transfer; oral clearing complete b/t trials. OM exam appeared wfl w/ no unilateral weakness noted. Pt fed self but required assistance d/t overall weakness. Recommend a more mech soft diet for easier mastication effort and conservation of energy; thin liquids. Recommend general aspiration precautions including rest breaks b/t bites/sips to avoid SOB/WOB during meals; no talking during eating to conserve respiratory energy. Explained Pills in Puree if needed for easier swallowing. ST services can be available for any further education if needed while admitted. NSG updated.  SLP Visit Diagnosis: Dysphagia, unspecified (R13.10)    Aspiration Risk  (reduced following aspiration precautions)    Diet Recommendation  Mech Soft diet consistency for conservation of energy; Thin liquids. General aspiration precautions; support w/ meals as needed d/t overall weakness  Medication Administration: Whole meds with liquid(but Whole in puree if needed )    Other  Recommendations Recommended Consults: (Dietician f/u as needed ) Oral Care Recommendations: Oral care BID;Patient independent with oral  care Other Recommendations: (n/a)   Follow up Recommendations None      Frequency and Duration (n/a)  (n/a)       Prognosis Prognosis for Safe Diet Advancement: Good Barriers to Reach Goals: (n/a)      Swallow Study    General Date of Onset: 11/25/18 Type of Study: Bedside Swallow Evaluation Previous Swallow Assessment: none Diet Prior to this Study: Thin liquids(clear liquid diet post NPO; regular diet at home per pt) Temperature Spikes Noted: No(wbc 8.8) Respiratory Status: Nasal cannula(2 liters) History of Recent Intubation: Yes Length of Intubations (days): 6 days Date extubated: 12/01/18 Behavior/Cognition: Alert;Cooperative;Pleasant mood Oral Cavity Assessment: Within Functional Limits Oral Care Completed by SLP: Recent completion by staff Oral Cavity - Dentition: Adequate natural dentition Vision: Functional for self-feeding Self-Feeding Abilities: Able to feed self;Needs assist;Needs set up(overall weakness) Patient Positioning: Upright in chair Baseline Vocal Quality: Hoarse;Low vocal intensity(Raspy. Noted inspiratory effort when exerted) Volitional Cough: Strong Volitional Swallow: Able to elicit    Oral/Motor/Sensory Function Overall Oral Motor/Sensory Function: Within functional limits   Ice Chips Ice chips: Not tested Other Comments: pt already on thin liquids and drinking water in room   Thin Liquid Thin Liquid: Within functional limits(grossly) Presentation: Cup;Self Fed;Straw(3 trials via each method) Other Comments: pt did have min increased inspiratory effort if taking multiple sips in a row; this decreased when she took 1-2 sips at one time then a rest break before the next bolus    Nectar Thick Nectar Thick Liquid: Not tested   Honey Thick Honey Thick Liquid: Not tested   Puree Puree: Within functional limits Presentation: Self Fed;Spoon(setup assist; 4 trials) Other Comments: weak UEs for self-feeding   Solid     Solid: Within functional limits(grossly) Presentation: Self Fed;Spoon(4 trials) Other Comments: pt did have min increased inspiratory effort if taking multiple bites in a row; this decreased when she took 1-2 bites then a rest break before the next bolus        Jerilynn SomKatherine Sugey Trevathan, MS, CCC-SLP Suellen Durocher 12/03/2018,12:29 PM

## 2018-12-03 NOTE — Evaluation (Signed)
Physical Therapy Evaluation Patient Details Name: Samantha [name] A Dasch MRN: [id number] DOB: Oct 01, 1966 Today's Date: 12/03/2018   History of Present Illness  Pt is a 52 y.o. female presenting to hospital 11/25/18 with increased SOB and productive cough.  Pt intubated 11/25/18 and extubated 12/01/18.  Pt admitted with acute hypoxemic respiratory failure related to AECOPD; also noted with hypercapnia.  PMH includes COPD (2 L home O2 at night), DM, htn, acute on chronic respiratory failure with hypoxemia.  Clinical Impression  Prior to hospital admission, pt was independent with ambulation; used 2 L O2 at night.  Pt lives with family on main level of home with 3-4 STE with B railings.  Currently pt is SBA semi-supine to sit; min assist to stand from bed; and CGA to ambulate a few feet bed to recliner with RW.  Pt's LE's appearing weak with mobility but stable with RW use; deferred further ambulation d/t pt's increased SOB with limited mobility (pt provided with sitting rest breaks between activities to manage SOB).  O2 sats 90% or greater on 2 L O2 via nasal cannula during session's activities.  Pt intermittently teary during session d/t current situation.  Pt would benefit from skilled PT to address noted impairments and functional limitations (see below for any additional details).  Upon hospital discharge, recommend pt discharge to STR.    Follow Up Recommendations SNF    Equipment Recommendations  Rolling walker with 5" wheels    Recommendations for Other Services OT consult     Precautions / Restrictions Precautions Precautions: Fall Restrictions Weight Bearing Restrictions: No      Mobility  Bed Mobility Overal bed mobility: Needs Assistance Bed Mobility: Supine to Sit     Supine to sit: Supervision;HOB elevated     General bed mobility comments: increased effort to perform on own  Transfers Overall transfer level: Needs assistance Equipment used: Rolling walker (2  wheeled) Transfers: Sit to/from Stand Sit to Stand: Min assist;Min guard         General transfer comment: min assist to stand from bed x2 trials with RW; CGA to stand from recliner x3 trials; vc's required for UE/LE placement and technique  Ambulation/Gait Ambulation/Gait assistance: Min guard Gait Distance (Feet): 3 Feet(bed to chair) Assistive device: Rolling walker (2 wheeled)   Gait velocity: decreased   General Gait Details: x10 B LE AROM stepping in place with B UE support on RW;  decreased B step length/foot clearance/heelstrike; LE's appearing weak but stable with RW use  Stairs            Wheelchair Mobility    Modified Rankin (Stroke Patients Only)       Balance Overall balance assessment: Needs assistance Sitting-balance support: No upper extremity supported Sitting balance-Leahy Scale: Good Sitting balance - Comments: steady sitting reaching within BOS   Standing balance support: Single extremity supported Standing balance-Leahy Scale: Poor Standing balance comment: pt requiring at least single UE support for static standing balance                             Pertinent Vitals/Pain Pain Assessment: No/denies pain  HR WFL during session's activities.    Home Living Family/patient expects to be discharged to:: Private residence Living Arrangements: Other relatives(Brother and sister) Available Help at Discharge: Family Type of Home: House Home Access: Stairs to enter Entrance Stairs-Rails: Right;Left;Can reach both Entrance Stairs-Number of Steps: 3-4 Home Layout: Two level;Able to live on main level with  bedroom/bathroom Home Equipment: Grab bars - tub/shower      Prior Function Level of Independence: Independent         Comments: (-) driving; no falls prior to hospital admission     Hand Dominance        Extremity/Trunk Assessment   Upper Extremity Assessment Upper Extremity Assessment: Defer to OT evaluation(R hand  grip strength and R shoulder flexion AROM appearing weaker than L UE)    Lower Extremity Assessment Lower Extremity Assessment: RLE deficits/detail;LLE deficits/detail RLE Deficits / Details: hip flexion 4/5; knee flexion/extension 4+/5; DF 4+/5 LLE Deficits / Details: hip flexion 3-/5; knee flexion/extension 4/5; DF 4/5    Cervical / Trunk Assessment Cervical / Trunk Assessment: Normal  Communication   Communication: No difficulties  Cognition Arousal/Alertness: Awake/alert Behavior During Therapy: (Teary at times about her situation) Overall Cognitive Status: Within Functional Limits for tasks assessed                                        General Comments   Nursing cleared pt for participation in physical therapy and reports pt fell last night (pt reports she got up to try to walk to get a personal item and fell down d/t weakness; pt reports no pain or injuries).  Pt agreeable to PT session.    Exercises     Assessment/Plan    PT Assessment Patient needs continued PT services  PT Problem List Decreased strength;Decreased activity tolerance;Decreased balance;Decreased mobility;Decreased knowledge of use of DME;Decreased knowledge of precautions;Cardiopulmonary status limiting activity       PT Treatment Interventions DME instruction;Gait training;Stair training;Functional mobility training;Therapeutic activities;Therapeutic exercise;Balance training;Patient/family education    PT Goals (Current goals can be found in the Care Plan section)  Acute Rehab PT Goals Patient Stated Goal: to improve walking and strength PT Goal Formulation: With patient Time For Goal Achievement: 12/17/18 Potential to Achieve Goals: Good    Frequency Min 2X/week   Barriers to discharge Decreased caregiver support      Co-evaluation               AM-PAC PT "6 Clicks" Mobility  Outcome Measure Help needed turning from your back to your side while in a flat bed without  using bedrails?: None Help needed moving from lying on your back to sitting on the side of a flat bed without using bedrails?: A Little Help needed moving to and from a bed to a chair (including a wheelchair)?: A Little Help needed standing up from a chair using your arms (e.g., wheelchair or bedside chair)?: A Little Help needed to walk in hospital room?: A Lot Help needed climbing 3-5 steps with a railing? : A Lot 6 Click Score: 17    End of Session Equipment Utilized During Treatment: Gait belt;Oxygen(2 L via nasal cannula) Activity Tolerance: Patient limited by fatigue Patient left: in chair;with call bell/phone within reach;with chair alarm set;with SCD's reapplied;Other (comment)(fall mat in place) Nurse Communication: Mobility status;Precautions PT Visit Diagnosis: Other abnormalities of gait and mobility (R26.89);Muscle weakness (generalized) (M62.81);History of falling (Z91.81);Difficulty in walking, not elsewhere classified (R26.2)    Time: [id number] PT Time Calculation (min) (ACUTE ONLY): 31 min   Charges:   PT Evaluation $PT Eval Low Complexity: 1 Low PT Treatments $Therapeutic Activity: 8-22 mins       Hendricks LimesEmily Charene Mccallister, PT 12/03/18, 1:48 PM [phone]

## 2018-12-04 LAB — GLUCOSE, CAPILLARY
Glucose-Capillary: 79 mg/dL (ref 70–99)
Glucose-Capillary: 135 mg/dL — ABNORMAL HIGH (ref 70–99)

## 2018-12-04 MED ORDER — PREDNISONE 10 MG PO TABS: ORAL_TABLET | ORAL | 0 refills | Status: DC

## 2018-12-04 NOTE — Discharge Summary (Addendum)
[facility] - [place] [facility]   PATIENT NAME: Samantha SchimkeDeffiney [name]    MR#:  [id number]  DATE OF BIRTH:  17-Sep-1966  DATE OF ADMISSION:  11/25/2018 ADMITTING PHYSICIAN: Arnaldo NatalMichael S Diamond, MD  DATE OF DISCHARGE: 12/04/2018  [facility]IC[facility]    ADMISSION DIAGNOSIS:  Respiratory distress [R06.03] COPD exacerbation (HCC) [J44.1] Acute on chronic respiratory failure with hypoxia and hypercapnia (HCC) [J96.21, J96.22]  DISCHARGE DIAGNOSIS:  Acute on chronic respiratory failure with hypoxia and hypercapnia due to COPD exacerbation status post intubation/mechanical ventilator  SECONDARY DIAGNOSIS:   Past Medical History:  Diagnosis Date  . Acute on chronic respiratory failure with hypoxemia (HCC) 08/30/2015  . Asthma   . COPD (chronic obstructive pulmonary disease) (HCC)   . Diabetes mellitus without complication (HCC)   . Hypertension     HOSPITAL COURSE:  52 year old female with past medical history of COPD, hypertension, diabetes, obesity who presented to the hospital due to shortness of breath and noted to be in acute on chronic respiratory failure with hypoxia and hypercapnia.  1.  Acute on chronic respiratory failure with hypoxia and hypercapnia-secondary to COPD exacerbation. -Patient later for a few days. She is extubated doing well. Slow improvement. -on [place] oxygen-- patient wears oxygen chronically -patient continues to exhibit signs of hypercapnia associated with chronic respiratory failure secondary to severe COPD. Patient requires the use of noninvasive ventilator both at bedtime and daytime to help with exacerbation periods. The use of the noninvasive ventilator will treat patients high PCO2 levels and can reduce the risk of exacerbations and future hospitalizations when used at night and during the day. Patient will need these advanced settings in conjunction with her current medication regimen, BiPAP is not an option  due to its functional limitations in the severity of patient's condition. Failure to have noninvasive ventilator available for use over a 24 hour period could lead to death.  2.  COPD exacerbation-source of patient's worsening respiratory failure with hypoxia and hypercapnia.  - Continue IV steroids--- change to oral taper, scheduled duo nebs, Pulmicort nebs.   -Procalcitonin times two negative. Antibiotics discontinued  as per intensivist.  3.  Diabetes type 2 without complication- continue sliding scale insulin.   - BS stable -resume metformin and glyburide  4. Neuropathy - cont. Gabapentin.   5. Acute Kidney Injury - likely ATN.  - resolved with IV fluid hydration. Cr. At baseline now.  6. Hypertension resume losartan/hydrochlorothiazide. Creatinine at baseline.     D/w pt   patient is wanting to go home with home health which will be arranged. Discharged today patient is agreeable with the plan. Smoking cessation discussed.   CONSULTS OBTAINED:  Treatment Team:  Enedina FinnerPatel, Merisa Julio, MD  DRUG ALLERGIES:   Allergies  Allergen Reactions  . Percocet [Oxycodone-Acetaminophen] Itching    DISCHARGE MEDICATIONS:   Allergies as of 12/04/2018      Reactions   Percocet [oxycodone-acetaminophen] Itching      Medication List    TAKE these medications   albuterol 108 (90 Base) MCG/ACT inhaler Commonly known as:  PROVENTIL HFA;VENTOLIN HFA Inhale 2 puffs into the lungs every 6 (six) hours as needed for wheezing or shortness of breath.   budesonide-formoterol 160-4.5 MCG/ACT inhaler Commonly known as:  SYMBICORT Inhale 2 puffs into the lungs 2 (two) times daily.   gabapentin 300 MG capsule Commonly known as:  NEURONTIN Take 300 mg by mouth 2 (two) times daily.   glimepiride 4 MG tablet Commonly known as:  AMARYL Take 4 mg by mouth daily.   ipratropium-albuterol 0.5-2.5 (3) MG/3ML Soln Commonly known as:  DUONEB Take 3 mLs by nebulization every 6 (six) hours as needed  (shortness of breath).   losartan-hydrochlorothiazide 50-12.5 MG tablet Commonly known as:  HYZAAR Take 1 tablet by mouth daily.   metFORMIN 850 MG tablet Commonly known as:  GLUCOPHAGE Take 850 mg by mouth 2 (two) times daily with a meal.   montelukast 10 MG tablet Commonly known as:  SINGULAIR Take 10 mg by mouth at bedtime.   predniSONE 10 MG tablet Commonly known as:  DELTASONE Take 50 mg daily. Taper by 10 mg daily then stop.   tiotropium 18 MCG inhalation capsule Commonly known as:  SPIRIVA Place 18 mcg into inhaler and inhale daily.       If you experience worsening of your admission symptoms, develop shortness of breath, life threatening emergency, suicidal or homicidal thoughts you must seek medical attention immediately by calling 911 or calling your MD immediately  if symptoms less severe.  You Must read complete instructions/literature along with all the possible adverse reactions/side effects for all the Medicines you take and that have been prescribed to you. Take any new Medicines after you have completely understood and accept all the possible adverse reactions/side effects.   Please note  You were cared for by a hospitalist during your hospital stay. If you have any questions about your discharge medications or the care you received while you were in the hospital after you are discharged, you can call the unit and asked to speak with the hospitalist on call if the hospitalist that took care of you is not available. Once you are discharged, your primary care physician will handle any further medical issues. Please note that NO REFILLS for any discharge medications will be authorized once you are discharged, as it is imperative that you return to your primary care physician (or establish a relationship with a primary care physician if you do not have one) for your aftercare needs so that they can reassess your need for medications and monitor your lab values. Today    SUBJECTIVE   No new complaints. Anxious to go home.  VITAL SIGNS:  Blood pressure 130/70, pulse 76, temperature 98.3 F (36.8 C), temperature source Oral, resp. rate (!) 23, height 5\' 8"  (1.727 m), weight 119.7 kg, SpO2 (!) 86 %.  I/O:    Intake/Output Summary (Last 24 hours) at 12/04/2018 0711 Last data filed at 12/04/2018 0523 Gross per 24 hour  Intake 240 ml  Output 800 ml  Net -560 ml    PHYSICAL EXAMINATION:  GENERAL:  52 y.o.-year-old patient lying in the bed with no acute distress. Morbidly obese EYES: Pupils equal, round, reactive to light and accommodation. No scleral icterus. Extraocular muscles intact.  HEENT: Head atraumatic, normocephalic. Oropharynx and nasopharynx clear.  NECK:  Supple, no jugular venous distention. No thyroid enlargement, no tenderness.  LUNGS: distant breath sounds bilaterally, scattered wheezing, rales,rhonchi or crepitation. No use of accessory muscles of respiration.  CARDIOVASCULAR: S1, S2 normal. No murmurs, rubs, or gallops.  ABDOMEN: Soft, non-tender, non-distended. Bowel sounds present. No organomegaly or mass.  EXTREMITIES: No pedal edema, cyanosis, or clubbing.  NEUROLOGIC: Cranial nerves II through XII are intact. Muscle strength 5/5 in all extremities. Sensation intact. Gait not checked.  PSYCHIATRIC: The patient is alert and oriented x 3.  SKIN: No obvious rash, lesion, or ulcer.   DATA REVIEW:   CBC  Recent Labs  Lab  12/01/18 0450  WBC 8.8  HGB 12.4  HCT 40.5  PLT 255    Chemistries  Recent Labs  Lab 11/30/18 0429 12/01/18 0450  12/03/18 0426  NA 144 147*   < > 142  K 5.1 4.8   < > 3.9  CL 104 102   < > 98  CO2 34* 38*   < > 36*  GLUCOSE 213* 178*   < > 178*  BUN 61* 76*   < > 50*  CREATININE 1.16* 1.12*   < > 1.01*  CALCIUM 9.3 9.6   < > 8.8*  MG 2.9*  --   --   --   AST  --  24  --   --   ALT  --  54*  --   --   ALKPHOS  --  60  --   --   BILITOT  --  0.4  --   --    < > = values in this interval not  displayed.    Microbiology Results   Recent Results (from the past 240 hour(s))  MRSA PCR Screening     Status: None   Collection Time: 11/25/18  8:43 AM  Result Value Ref Range Status   MRSA by PCR NEGATIVE NEGATIVE Final    Comment:        The GeneXpert MRSA Assay (FDA approved for NASAL specimens only), is one component of a comprehensive MRSA colonization surveillance program. It is not intended to diagnose MRSA infection nor to guide or monitor treatment for MRSA infections. Performed at [facility], [address], [place], [place] [address]     RADIOLOGY:  No results found.   Management plans discussed with the patient, family and they are in agreement.  CODE STATUS:     Code Status Orders  (From admission, onward)         Start     Ordered   11/25/18 0838  Full code  Continuous     11/25/18 0837        Code Status History    Date Active Date Inactive Code Status Order ID Comments User Context   07/30/2018 1601 08/01/2018 1453 Full Code [id number]  Shaune Pollack, MD ED   02/21/2018 1126 02/23/2018 0138 Full Code [id number]  Houston Siren, MD Inpatient   10/06/2015 0407 10/08/2015 1307 Full Code [id number]  Arnaldo Natal, MD Inpatient   08/30/2015 1119 08/31/2015 1811 Full Code [id number]  Enedina Finner, MD Inpatient      TOTAL TIME TAKING CARE OF THIS PATIENT: **40* minutes.    Enedina Finner M.D on 12/04/2018 at 7:11 AM  Between 7am to 6pm - Pager - [phone] After 6pm go to [email or web address] - Social research officer, government  [facility]  Office  [phone]  CC: Primary care physician; Raynelle Bring

## 2018-12-04 NOTE — Progress Notes (Addendum)
O2  Qualifying: 94% resting room air 87% ambulating on room air  95% 4L  ambulating

## 2018-12-04 NOTE — Care Management Note (Signed)
Case Management Note  Patient Details  Name: Samantha [name] MRN: [id number] Date of Birth: 1966/03/11  Subjective/Objective:                 Patient declines skilled nursing facility placement even though her family has voiced concerns. Family has requested wheelchair. There is documentation of need for a walker. Have the signed order for the trilogy. Patient has not had home oxygen assessment for possible need of continuous 02. Patient declines need for wheelchair. Says she has one that was her mother's. Asked patient why family is so insistent to have one and patient says "I don't know.  I have one."  A family member requested ems transport but patient declines need.    Action/Plan:  Updated [facility] on discharge and gave the NIV signed order.  Have requested attending for completion of COPD protocol. Notified [facility] of continuous oxygen. Discussed wheelchair with patient a third time and even though have obtained the order, patient declines  Expected Discharge Date:  12/04/18               Expected Discharge Plan:  Home w Home Health Services  In-House Referral:     Discharge planning Services  CM Consult  Post Acute Care Choice:  Durable Medical Equipment, Home Health Choice offered to:  Patient  DME Arranged:  NIV, Oxygen, Wheelchair manual DME Agency:  [facility] to provide NIV and [facility] the continuous oxygen. [facility] will provide the wheelchair)  HH Arranged:  PT, OT, Nurse's Aide, RN [facility] Agency:  [facility]  Status of Service:  Completed, signed off  If discussed at Long Length of Stay Meetings, dates discussed:    Additional Comments:  Samantha [name], Samantha Pierron R, RN 12/04/2018, 8:52 AM

## 2018-12-04 NOTE — Progress Notes (Signed)
Clinical Education officer, museum (CSW) received a call from patient's daughter [name] this morning with concerns about patient coming home. CSW explained that patient did walk to her chair in the room yesterday and refused SNF. CSW explained that we can't force patient to go to SNF against her will. Daughter requested EMS for transport home and a wheel chair. CSW met with patient and made her aware of above. Patient is still adamant that she does not want to go to SNF and wants to go home. Per patient she does not want EMS for transport and her daughter [name] will pick her up at 12 noon today. Per patient she has a wheel chair, bedside commode and walker at home already. RN case manager aware of above.  [employer], LCSW [phone]

## 2018-12-04 NOTE — Progress Notes (Signed)
PT Note   Patient suffers from acute hypoxemic respiratory failure related to COPD which impairs her ability to perform daily activities like toileting, dressing, grooming, and bathing in the home. A cane, walker, or crutch will not resolve the patient's issue with performing activities of daily living. A wheelchair is required/recommended and will allow patient to safely perform daily activities.   Patient reports having a caregiver who can provide assistance.   Hendricks LimesEmily Sherrye Puga, PT 12/04/18, 8:56 AM [phone]

## 2018-12-06 DIAGNOSIS — J9601 Acute respiratory failure with hypoxia: Secondary | ICD-10-CM | POA: Diagnosis not present

## 2018-12-06 DIAGNOSIS — J441 Chronic obstructive pulmonary disease with (acute) exacerbation: Secondary | ICD-10-CM | POA: Diagnosis not present

## 2018-12-06 DIAGNOSIS — J9610 Chronic respiratory failure, unspecified whether with hypoxia or hypercapnia: Secondary | ICD-10-CM | POA: Diagnosis not present

## 2018-12-07 DIAGNOSIS — J9621 Acute and chronic respiratory failure with hypoxia: Secondary | ICD-10-CM | POA: Diagnosis not present

## 2018-12-07 DIAGNOSIS — E1140 Type 2 diabetes mellitus with diabetic neuropathy, unspecified: Secondary | ICD-10-CM | POA: Diagnosis not present

## 2018-12-07 DIAGNOSIS — J441 Chronic obstructive pulmonary disease with (acute) exacerbation: Secondary | ICD-10-CM | POA: Diagnosis not present

## 2018-12-07 DIAGNOSIS — J9622 Acute and chronic respiratory failure with hypercapnia: Secondary | ICD-10-CM | POA: Diagnosis not present

## 2018-12-07 DIAGNOSIS — J45909 Unspecified asthma, uncomplicated: Secondary | ICD-10-CM | POA: Diagnosis not present

## 2018-12-07 DIAGNOSIS — I10 Essential (primary) hypertension: Secondary | ICD-10-CM | POA: Diagnosis not present

## 2018-12-07 DIAGNOSIS — F17210 Nicotine dependence, cigarettes, uncomplicated: Secondary | ICD-10-CM | POA: Diagnosis not present

## 2018-12-07 DIAGNOSIS — Z9981 Dependence on supplemental oxygen: Secondary | ICD-10-CM | POA: Diagnosis not present

## 2018-12-07 DIAGNOSIS — F1490 Cocaine use, unspecified, uncomplicated: Secondary | ICD-10-CM | POA: Diagnosis not present

## 2018-12-10 DIAGNOSIS — J449 Chronic obstructive pulmonary disease, unspecified: Secondary | ICD-10-CM | POA: Diagnosis not present

## 2018-12-11 DIAGNOSIS — J4540 Moderate persistent asthma, uncomplicated: Secondary | ICD-10-CM | POA: Diagnosis not present

## 2018-12-13 DIAGNOSIS — J45998 Other asthma: Secondary | ICD-10-CM | POA: Diagnosis not present

## 2018-12-28 DIAGNOSIS — J45909 Unspecified asthma, uncomplicated: Secondary | ICD-10-CM | POA: Diagnosis not present

## 2018-12-28 DIAGNOSIS — F17210 Nicotine dependence, cigarettes, uncomplicated: Secondary | ICD-10-CM | POA: Diagnosis not present

## 2018-12-28 DIAGNOSIS — J9622 Acute and chronic respiratory failure with hypercapnia: Secondary | ICD-10-CM | POA: Diagnosis not present

## 2018-12-28 DIAGNOSIS — Z9981 Dependence on supplemental oxygen: Secondary | ICD-10-CM | POA: Diagnosis not present

## 2018-12-28 DIAGNOSIS — E1140 Type 2 diabetes mellitus with diabetic neuropathy, unspecified: Secondary | ICD-10-CM | POA: Diagnosis not present

## 2018-12-28 DIAGNOSIS — J9621 Acute and chronic respiratory failure with hypoxia: Secondary | ICD-10-CM | POA: Diagnosis not present

## 2018-12-28 DIAGNOSIS — J441 Chronic obstructive pulmonary disease with (acute) exacerbation: Secondary | ICD-10-CM | POA: Diagnosis not present

## 2018-12-28 DIAGNOSIS — I10 Essential (primary) hypertension: Secondary | ICD-10-CM | POA: Diagnosis not present

## 2019-01-04 DIAGNOSIS — J9610 Chronic respiratory failure, unspecified whether with hypoxia or hypercapnia: Secondary | ICD-10-CM | POA: Diagnosis not present

## 2019-01-04 DIAGNOSIS — J441 Chronic obstructive pulmonary disease with (acute) exacerbation: Secondary | ICD-10-CM | POA: Diagnosis not present

## 2019-01-04 DIAGNOSIS — J9601 Acute respiratory failure with hypoxia: Secondary | ICD-10-CM | POA: Diagnosis not present

## 2019-01-10 DIAGNOSIS — J449 Chronic obstructive pulmonary disease, unspecified: Secondary | ICD-10-CM | POA: Diagnosis not present

## 2019-01-13 DIAGNOSIS — J449 Chronic obstructive pulmonary disease, unspecified: Secondary | ICD-10-CM | POA: Diagnosis not present

## 2019-01-16 DIAGNOSIS — J9622 Acute and chronic respiratory failure with hypercapnia: Secondary | ICD-10-CM | POA: Diagnosis not present

## 2019-01-16 DIAGNOSIS — J9621 Acute and chronic respiratory failure with hypoxia: Secondary | ICD-10-CM | POA: Diagnosis not present

## 2019-01-16 DIAGNOSIS — J449 Chronic obstructive pulmonary disease, unspecified: Secondary | ICD-10-CM | POA: Diagnosis not present

## 2019-01-19 IMAGING — DX DG CHEST 1V PORT
1 series · 1 of 1 positions shown · non-contrast
Comparison: Chest radiograph performed 06/21/2017

CLINICAL DATA: Acute onset of shortness of breath and wheezing.
Initial encounter.

EXAM:
PORTABLE CHEST 1 VIEW

[chest ap]
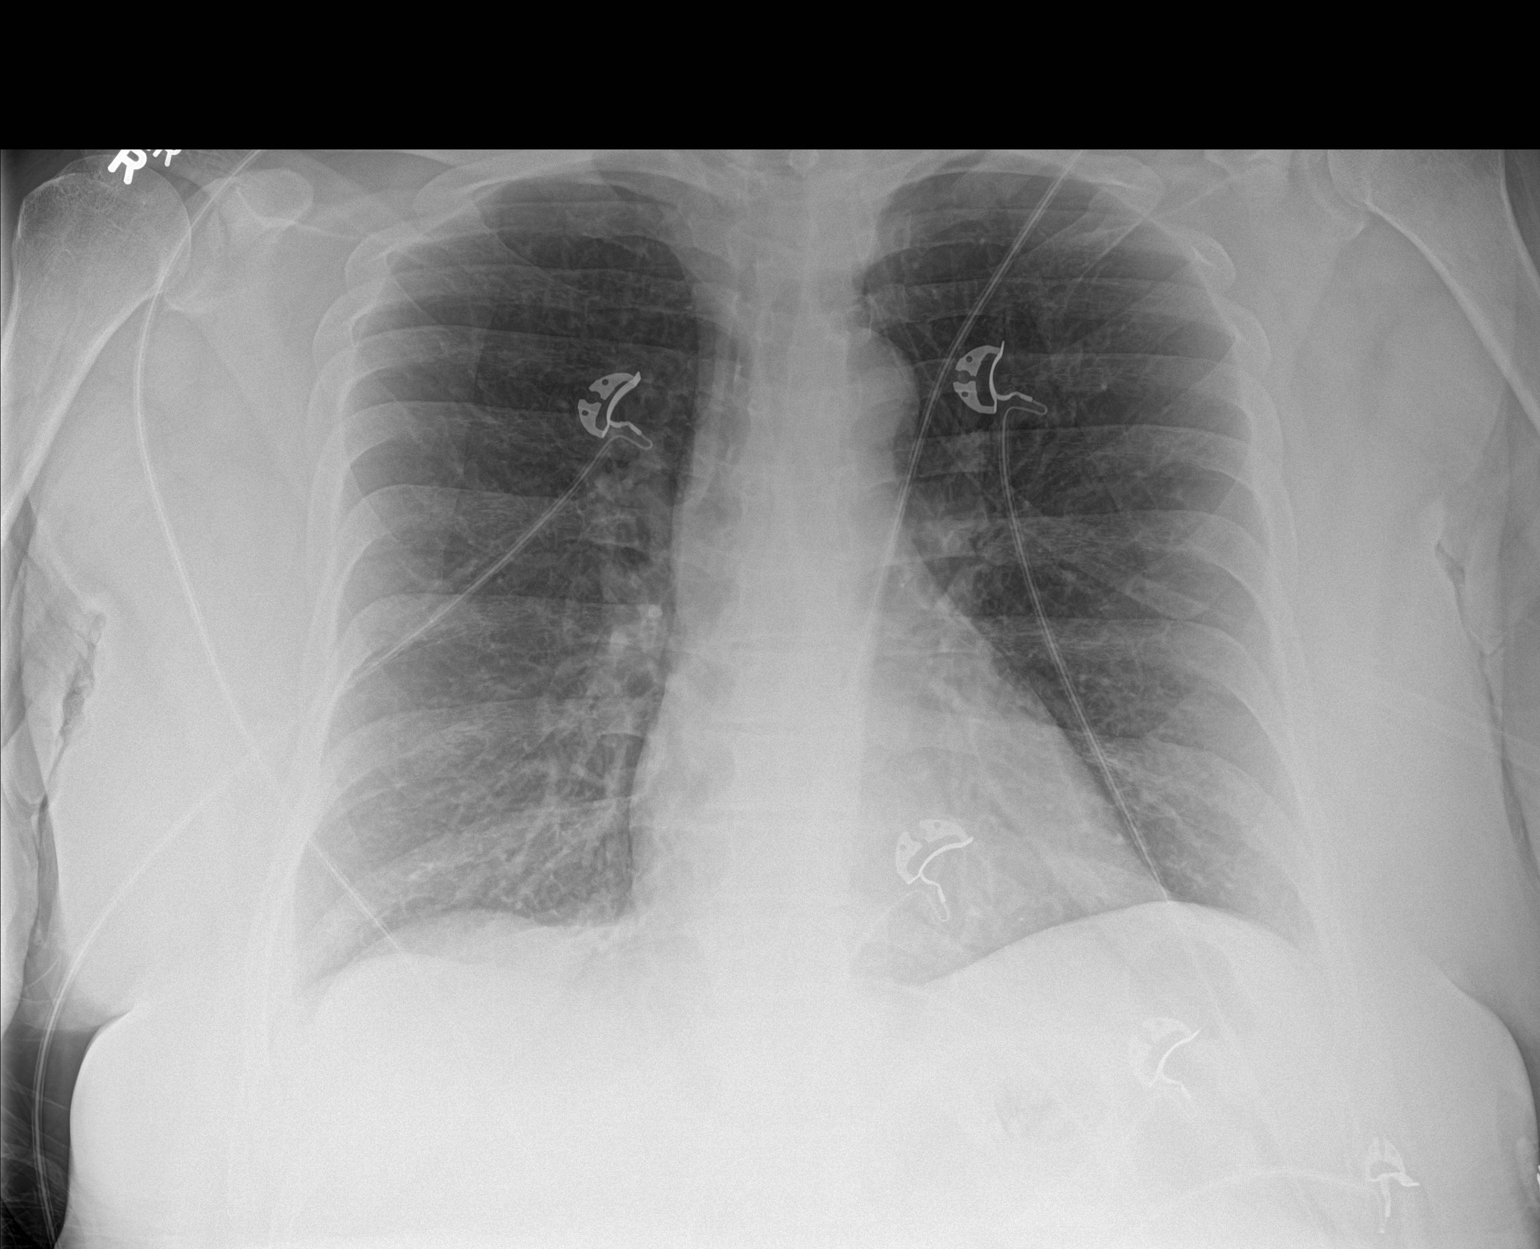

[1 of 1 positions shown; findings below may reference images not displayed]

FINDINGS: The lungs are well-aerated and clear. There is no evidence of focal
opacification, pleural effusion or pneumothorax.

The cardiomediastinal silhouette is within normal limits. No acute
osseous abnormalities are seen.
IMPRESSION: No acute cardiopulmonary process seen.

## 2019-01-30 IMAGING — DX DG CHEST 1V PORT
1 series · 1 of 1 positions shown · non-contrast
Comparison: 09/19/2017 and prior radiographs

CLINICAL DATA: Acute shortness of breath for 2 days.

EXAM:
PORTABLE CHEST 1 VIEW

[chest ap]
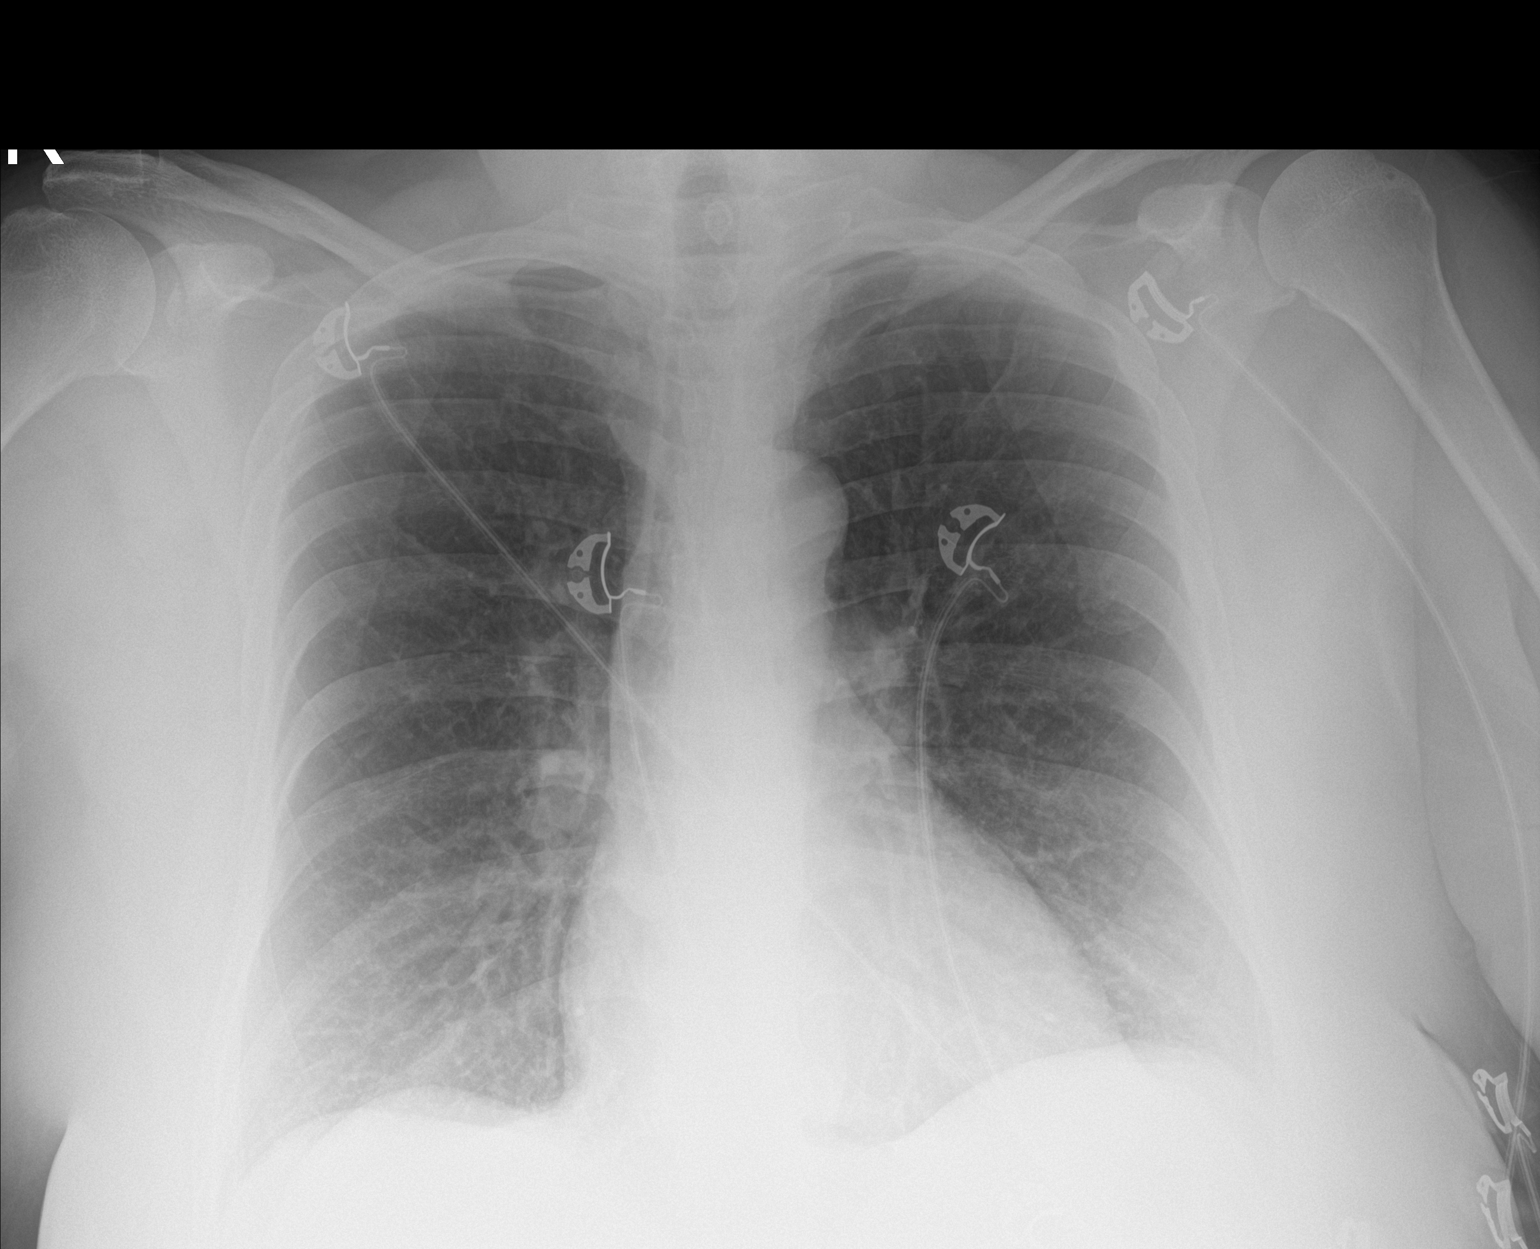

[1 of 1 positions shown; findings below may reference images not displayed]

FINDINGS: The cardiomediastinal silhouette is unremarkable.

There is no evidence of focal airspace disease, pulmonary edema,
suspicious pulmonary nodule/mass, pleural effusion, or pneumothorax.

No acute bony abnormalities are identified.
IMPRESSION: No active disease.

## 2019-02-04 DIAGNOSIS — J441 Chronic obstructive pulmonary disease with (acute) exacerbation: Secondary | ICD-10-CM | POA: Diagnosis not present

## 2019-02-04 DIAGNOSIS — J9601 Acute respiratory failure with hypoxia: Secondary | ICD-10-CM | POA: Diagnosis not present

## 2019-02-04 DIAGNOSIS — J9610 Chronic respiratory failure, unspecified whether with hypoxia or hypercapnia: Secondary | ICD-10-CM | POA: Diagnosis not present

## 2019-02-09 DIAGNOSIS — J449 Chronic obstructive pulmonary disease, unspecified: Secondary | ICD-10-CM | POA: Diagnosis not present

## 2019-02-11 DIAGNOSIS — J449 Chronic obstructive pulmonary disease, unspecified: Secondary | ICD-10-CM | POA: Diagnosis not present

## 2019-02-14 DIAGNOSIS — J9621 Acute and chronic respiratory failure with hypoxia: Secondary | ICD-10-CM | POA: Diagnosis not present

## 2019-02-14 DIAGNOSIS — J449 Chronic obstructive pulmonary disease, unspecified: Secondary | ICD-10-CM | POA: Diagnosis not present

## 2019-02-14 DIAGNOSIS — J9622 Acute and chronic respiratory failure with hypercapnia: Secondary | ICD-10-CM | POA: Diagnosis not present

## 2019-02-19 IMAGING — DX DG CHEST 1V
1 series · 1 of 1 positions shown · non-contrast
Comparison: 09/30/2017

CLINICAL DATA: Shortness of breath for 2 days

EXAM:
CHEST 1 VIEW

[chest ap]
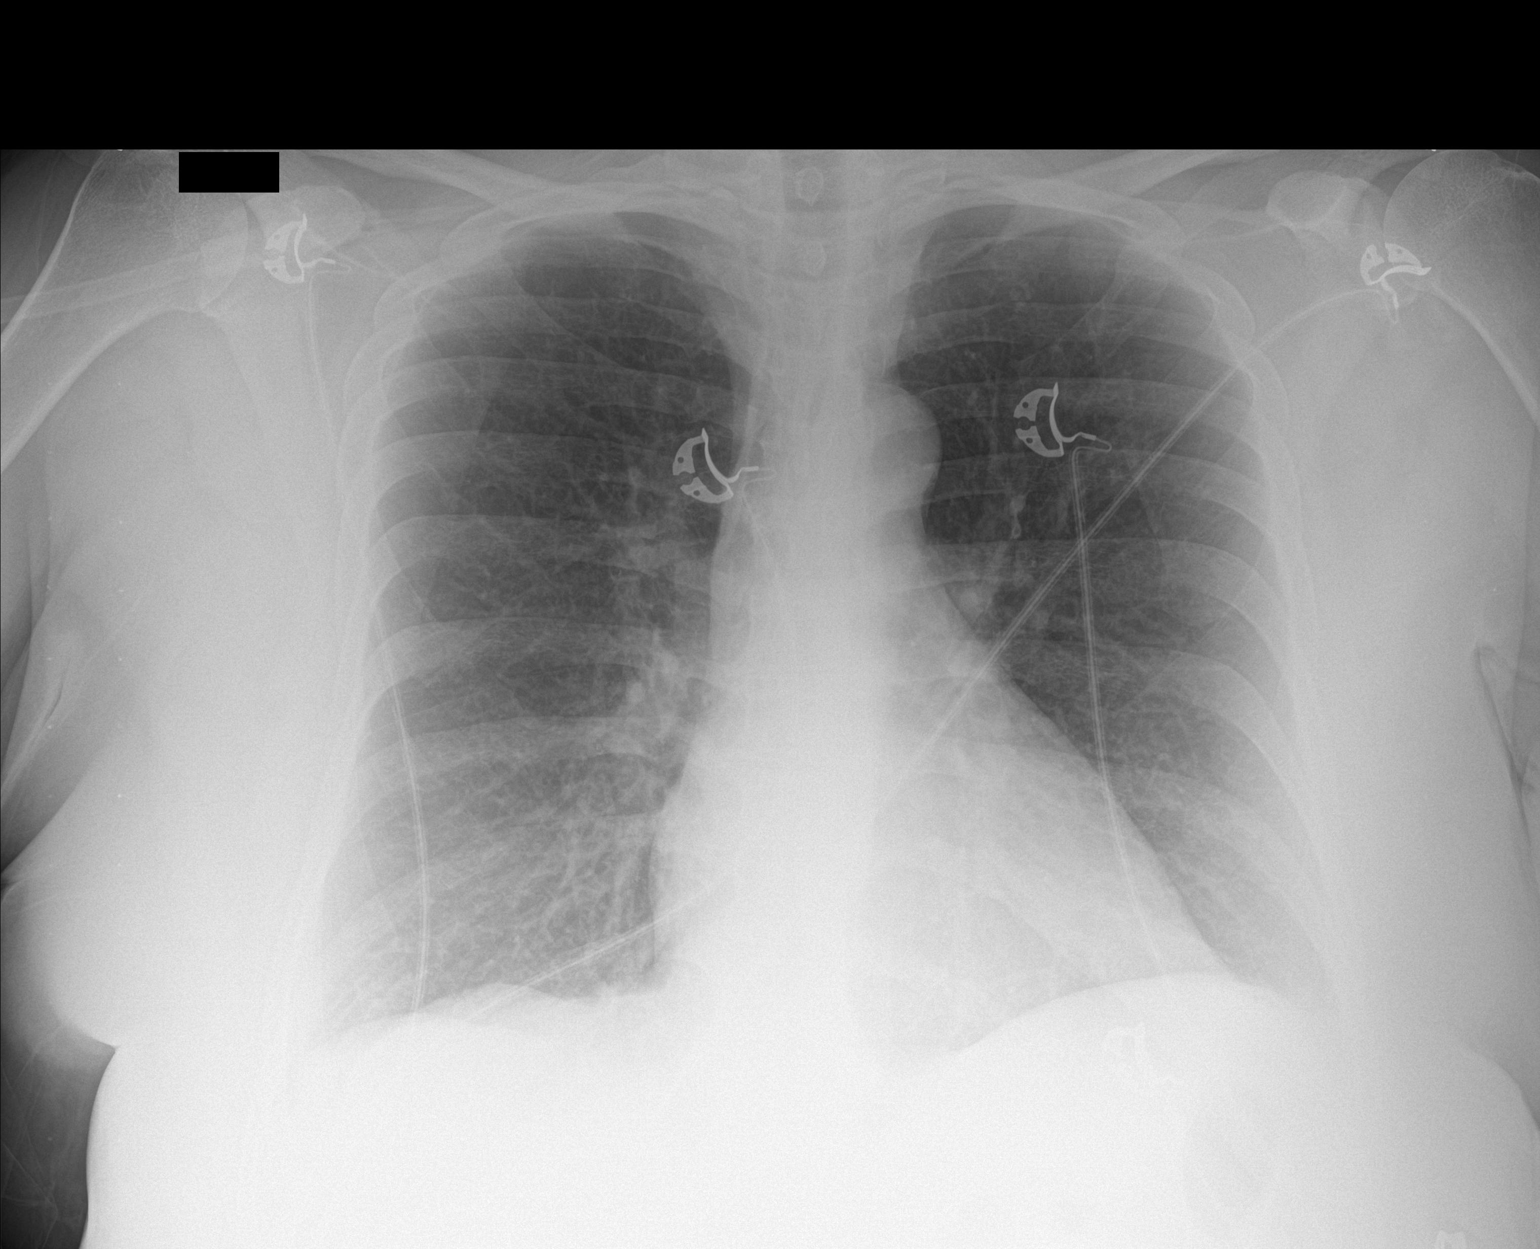

[1 of 1 positions shown; findings below may reference images not displayed]

FINDINGS: Cardiac shadow is stable. The lungs are well aerated bilaterally. No
focal infiltrate or sizable effusion is seen. No acute bony
abnormality noted.
IMPRESSION: No active disease.

## 2019-03-01 IMAGING — DX DG CHEST 1V PORT
1 series · 1 of 1 positions shown · non-contrast
Comparison: October 20, 2017

CLINICAL DATA: Shortness of Breath

EXAM:
PORTABLE CHEST 1 VIEW

[chest ap]
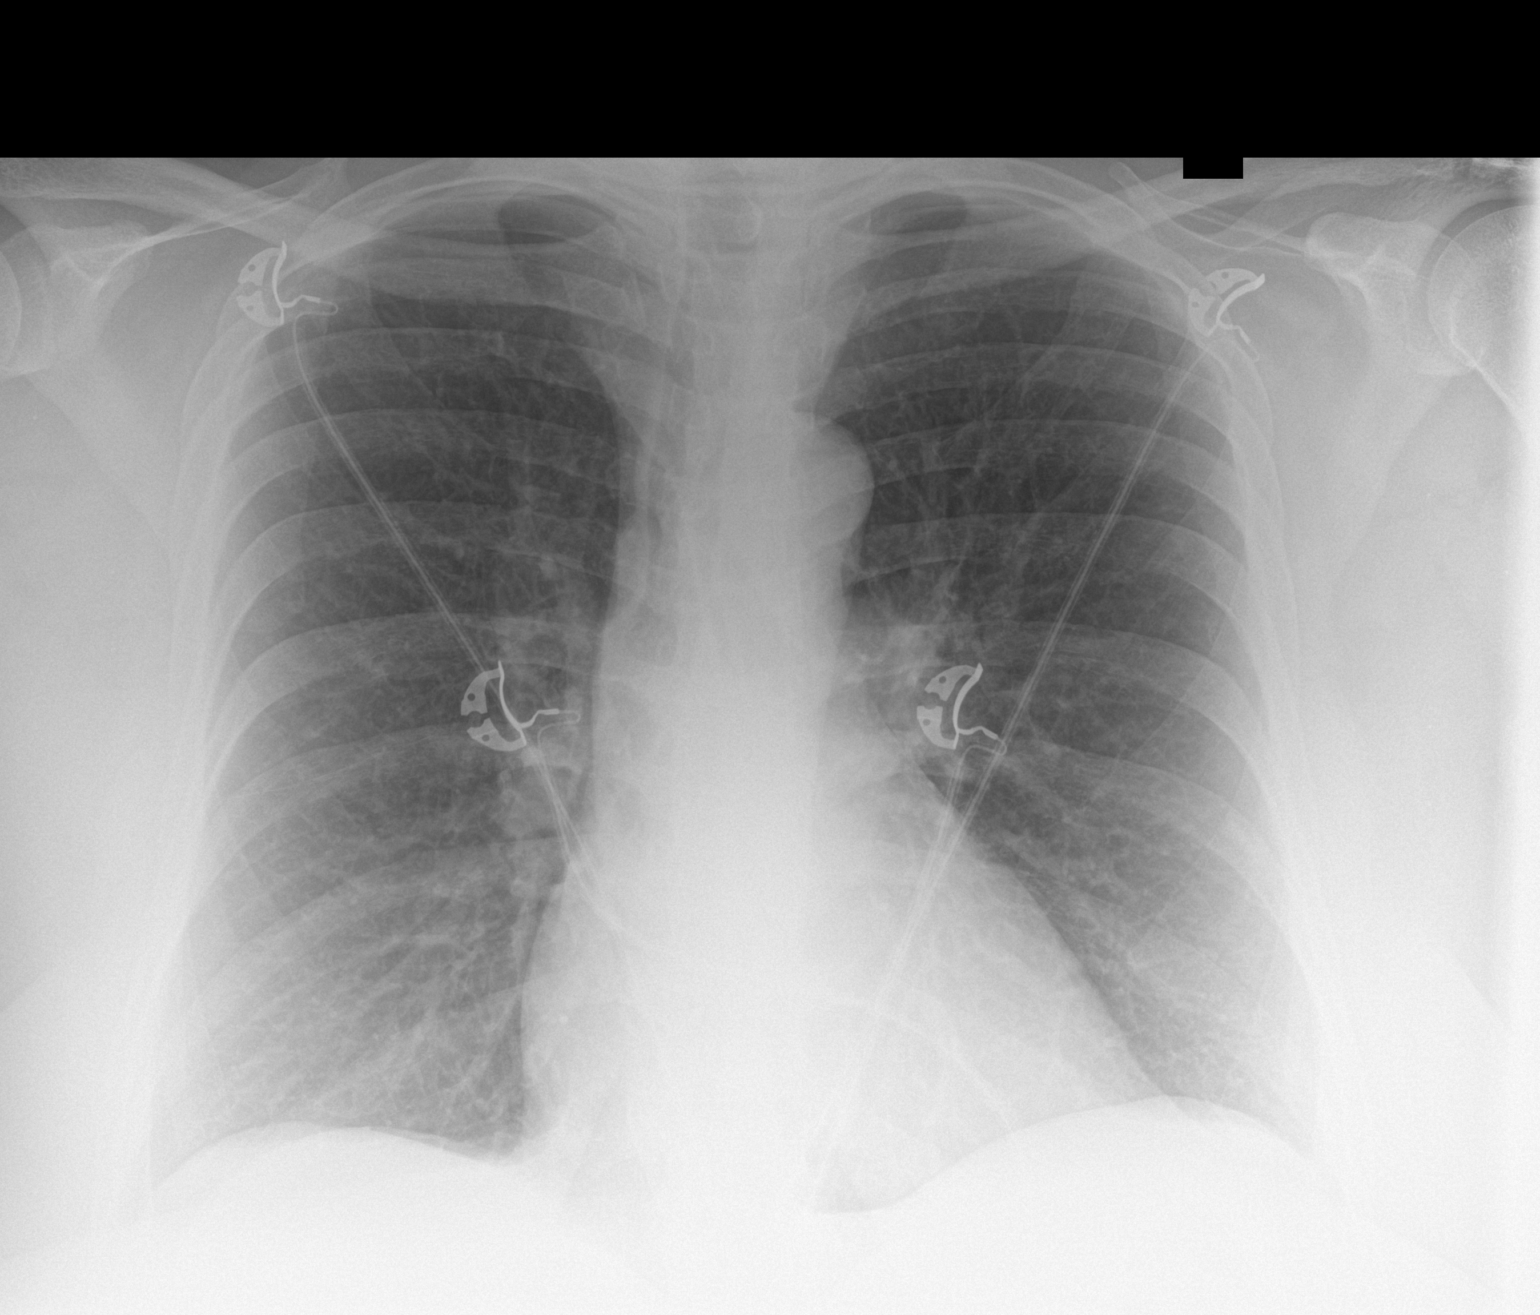

[1 of 1 positions shown; findings below may reference images not displayed]

FINDINGS: No edema or consolidation. The heart size and pulmonary vascularity
are normal. No adenopathy. No bone lesions.
IMPRESSION: No edema or consolidation.

## 2019-03-05 DIAGNOSIS — J9601 Acute respiratory failure with hypoxia: Secondary | ICD-10-CM | POA: Diagnosis not present

## 2019-03-05 DIAGNOSIS — J441 Chronic obstructive pulmonary disease with (acute) exacerbation: Secondary | ICD-10-CM | POA: Diagnosis not present

## 2019-03-11 DIAGNOSIS — J449 Chronic obstructive pulmonary disease, unspecified: Secondary | ICD-10-CM | POA: Diagnosis not present

## 2019-03-12 IMAGING — DX DG CHEST 1V PORT
1 series · 2 of 2 positions shown · non-contrast
Comparison: Radiographs 10/30/2017

CLINICAL DATA: Cough and dyspnea.

EXAM:
PORTABLE CHEST 1 VIEW

[Series 1: chest ap · 0.14mm/px · 2 of 2 slices shown]
[im 1/2]
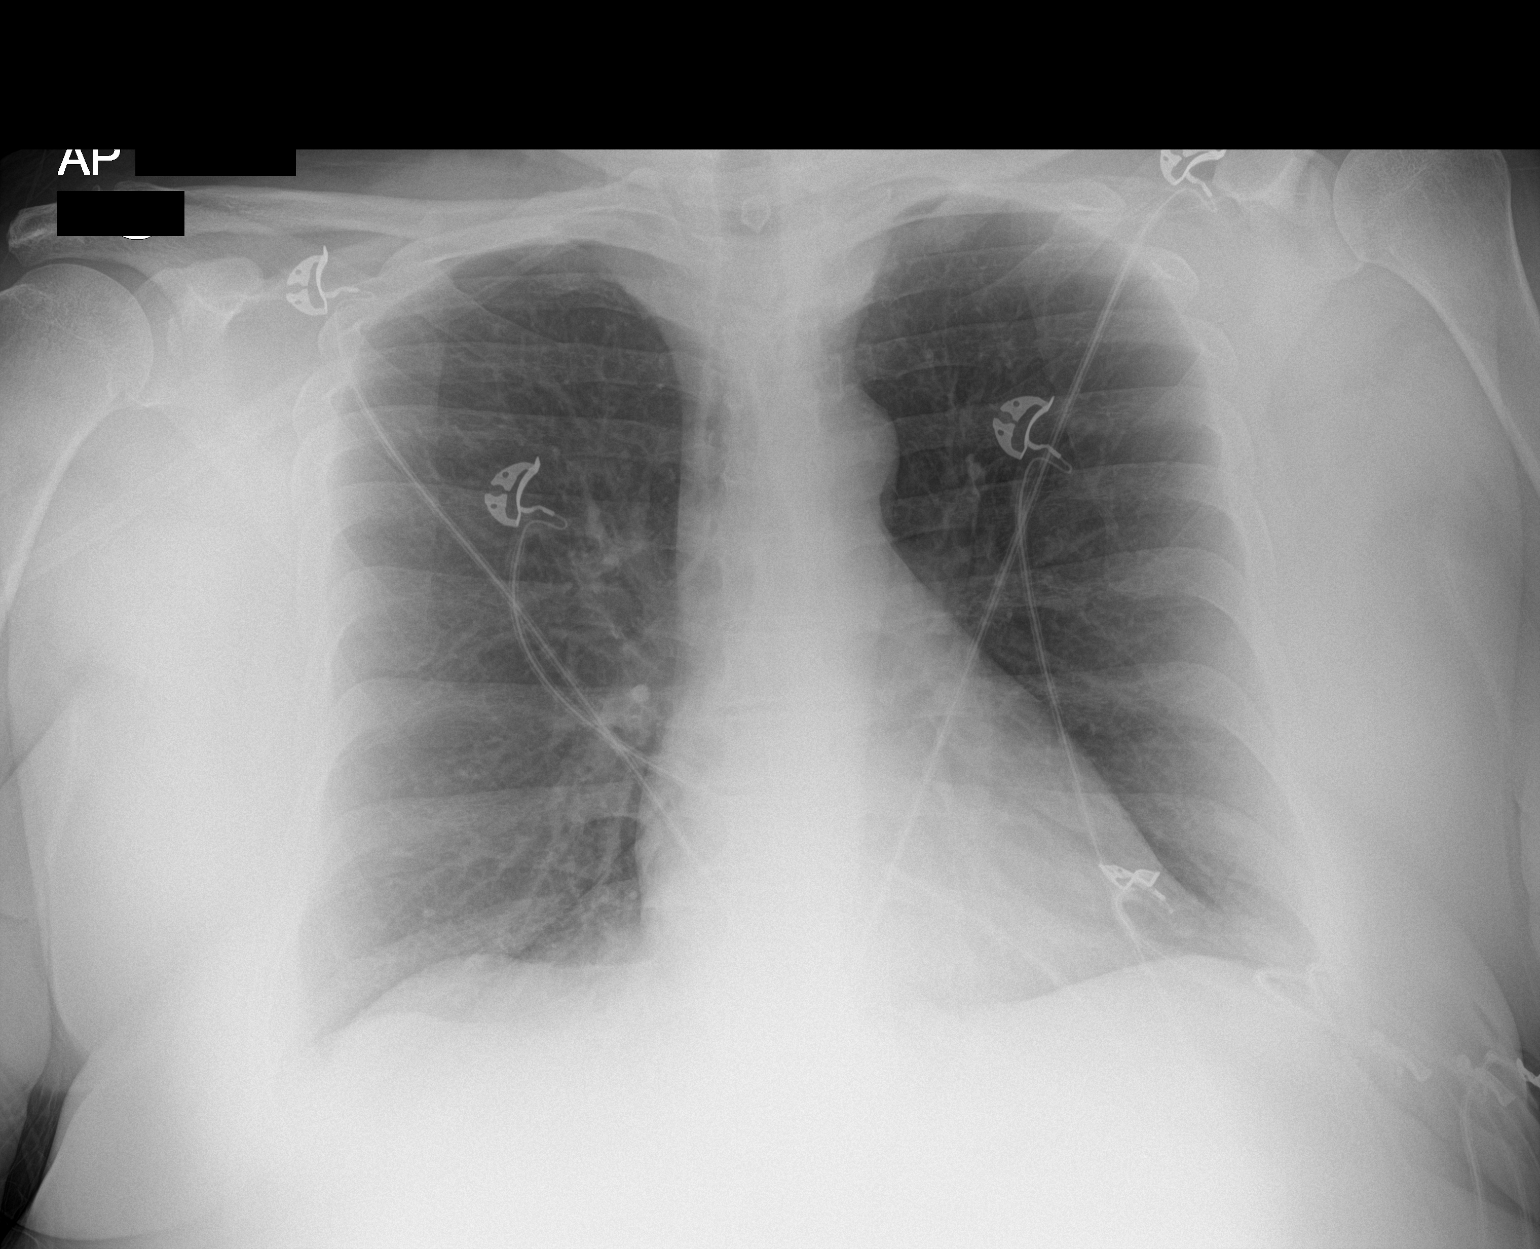
[im 2/2]
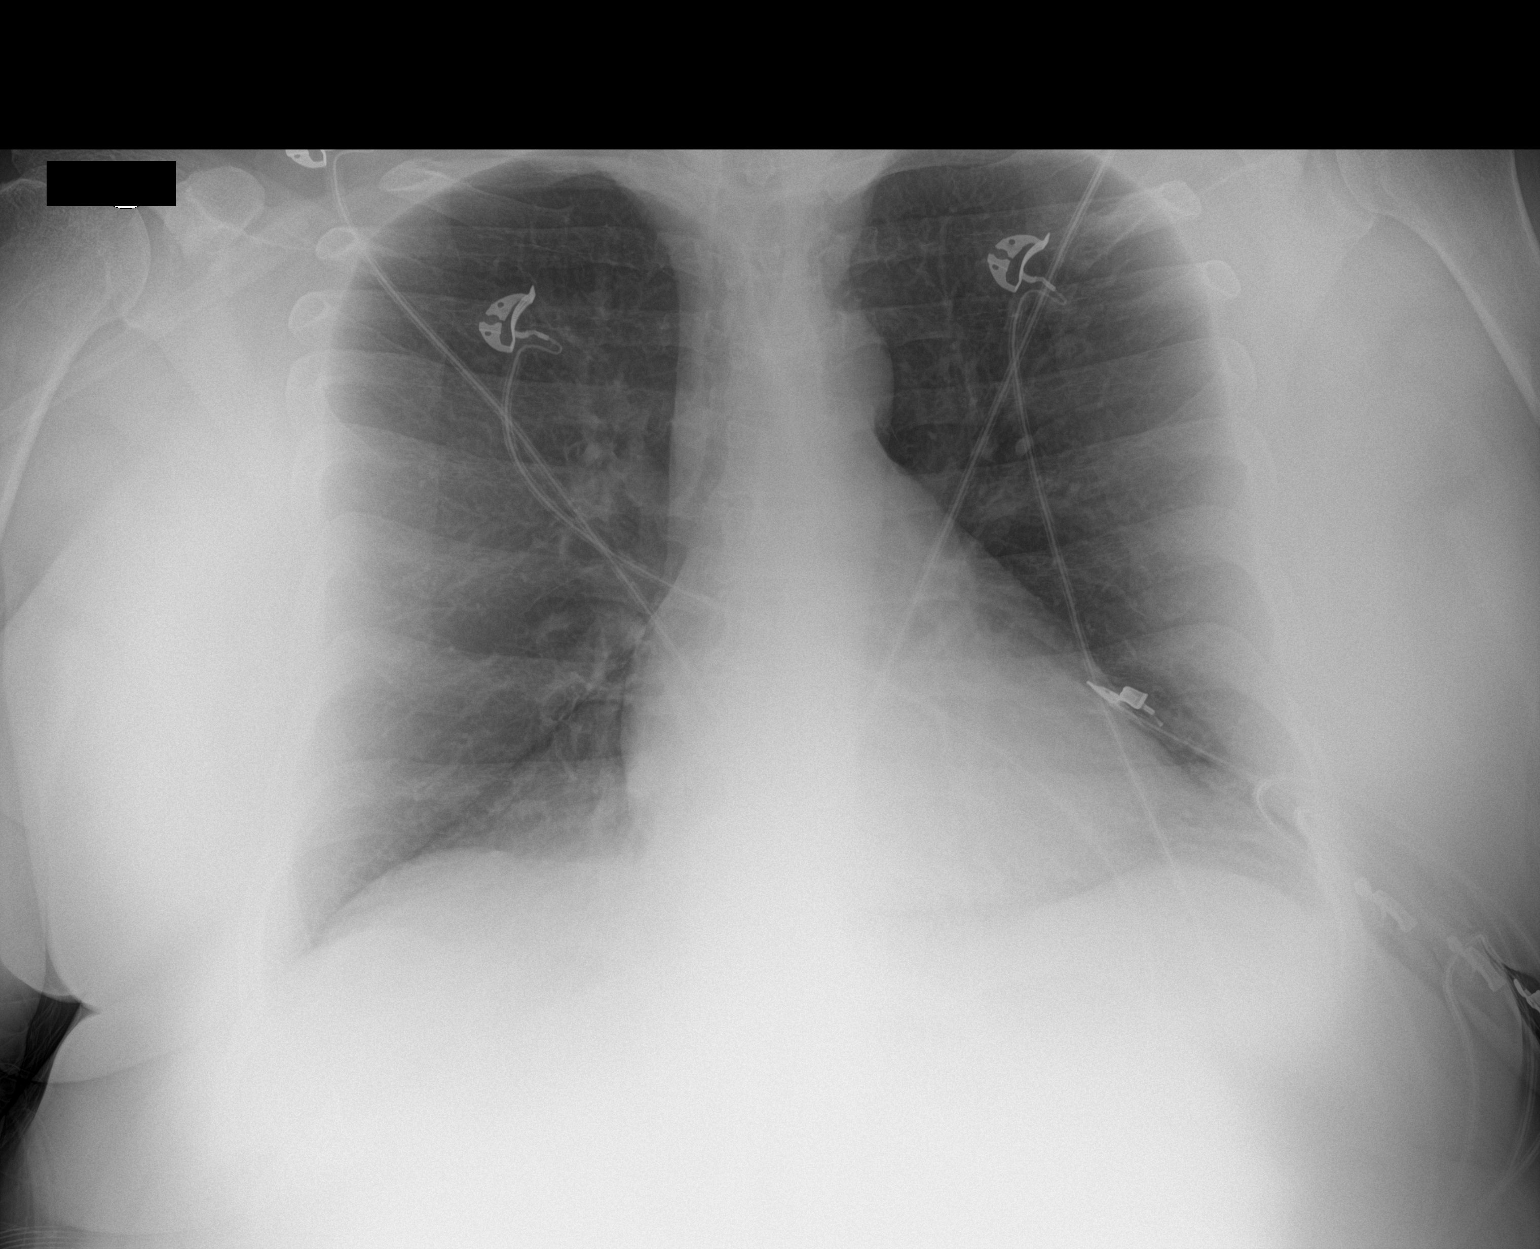

[2 of 2 positions shown; findings below may reference images not displayed]

FINDINGS: The cardiomediastinal contours are normal. Unchanged hyperinflation
and bronchial thickening. Pulmonary vasculature is normal. No
consolidation, pleural effusion, or pneumothorax. No acute osseous
abnormalities are seen.
IMPRESSION: Chronic bronchial thickening and hyperinflation. No new abnormality.

## 2019-03-14 DIAGNOSIS — J449 Chronic obstructive pulmonary disease, unspecified: Secondary | ICD-10-CM | POA: Diagnosis not present

## 2019-03-17 DIAGNOSIS — J449 Chronic obstructive pulmonary disease, unspecified: Secondary | ICD-10-CM | POA: Diagnosis not present

## 2019-03-17 DIAGNOSIS — J9622 Acute and chronic respiratory failure with hypercapnia: Secondary | ICD-10-CM | POA: Diagnosis not present

## 2019-03-17 DIAGNOSIS — J9621 Acute and chronic respiratory failure with hypoxia: Secondary | ICD-10-CM | POA: Diagnosis not present

## 2019-04-05 DIAGNOSIS — J441 Chronic obstructive pulmonary disease with (acute) exacerbation: Secondary | ICD-10-CM | POA: Diagnosis not present

## 2019-04-05 DIAGNOSIS — J9601 Acute respiratory failure with hypoxia: Secondary | ICD-10-CM | POA: Diagnosis not present

## 2019-04-05 IMAGING — CR DG CHEST 2V
1 series · 2 of 2 positions shown · non-contrast
Comparison: 11/10/2017

CLINICAL DATA: Shortness of breath for 2 days, history COPD,
diabetes mellitus, hypertension

EXAM:
CHEST  2 VIEW

[Series 1: dg chest 2 view · 0.14mm/px · 2 of 2 slices shown]
[im 1/2]
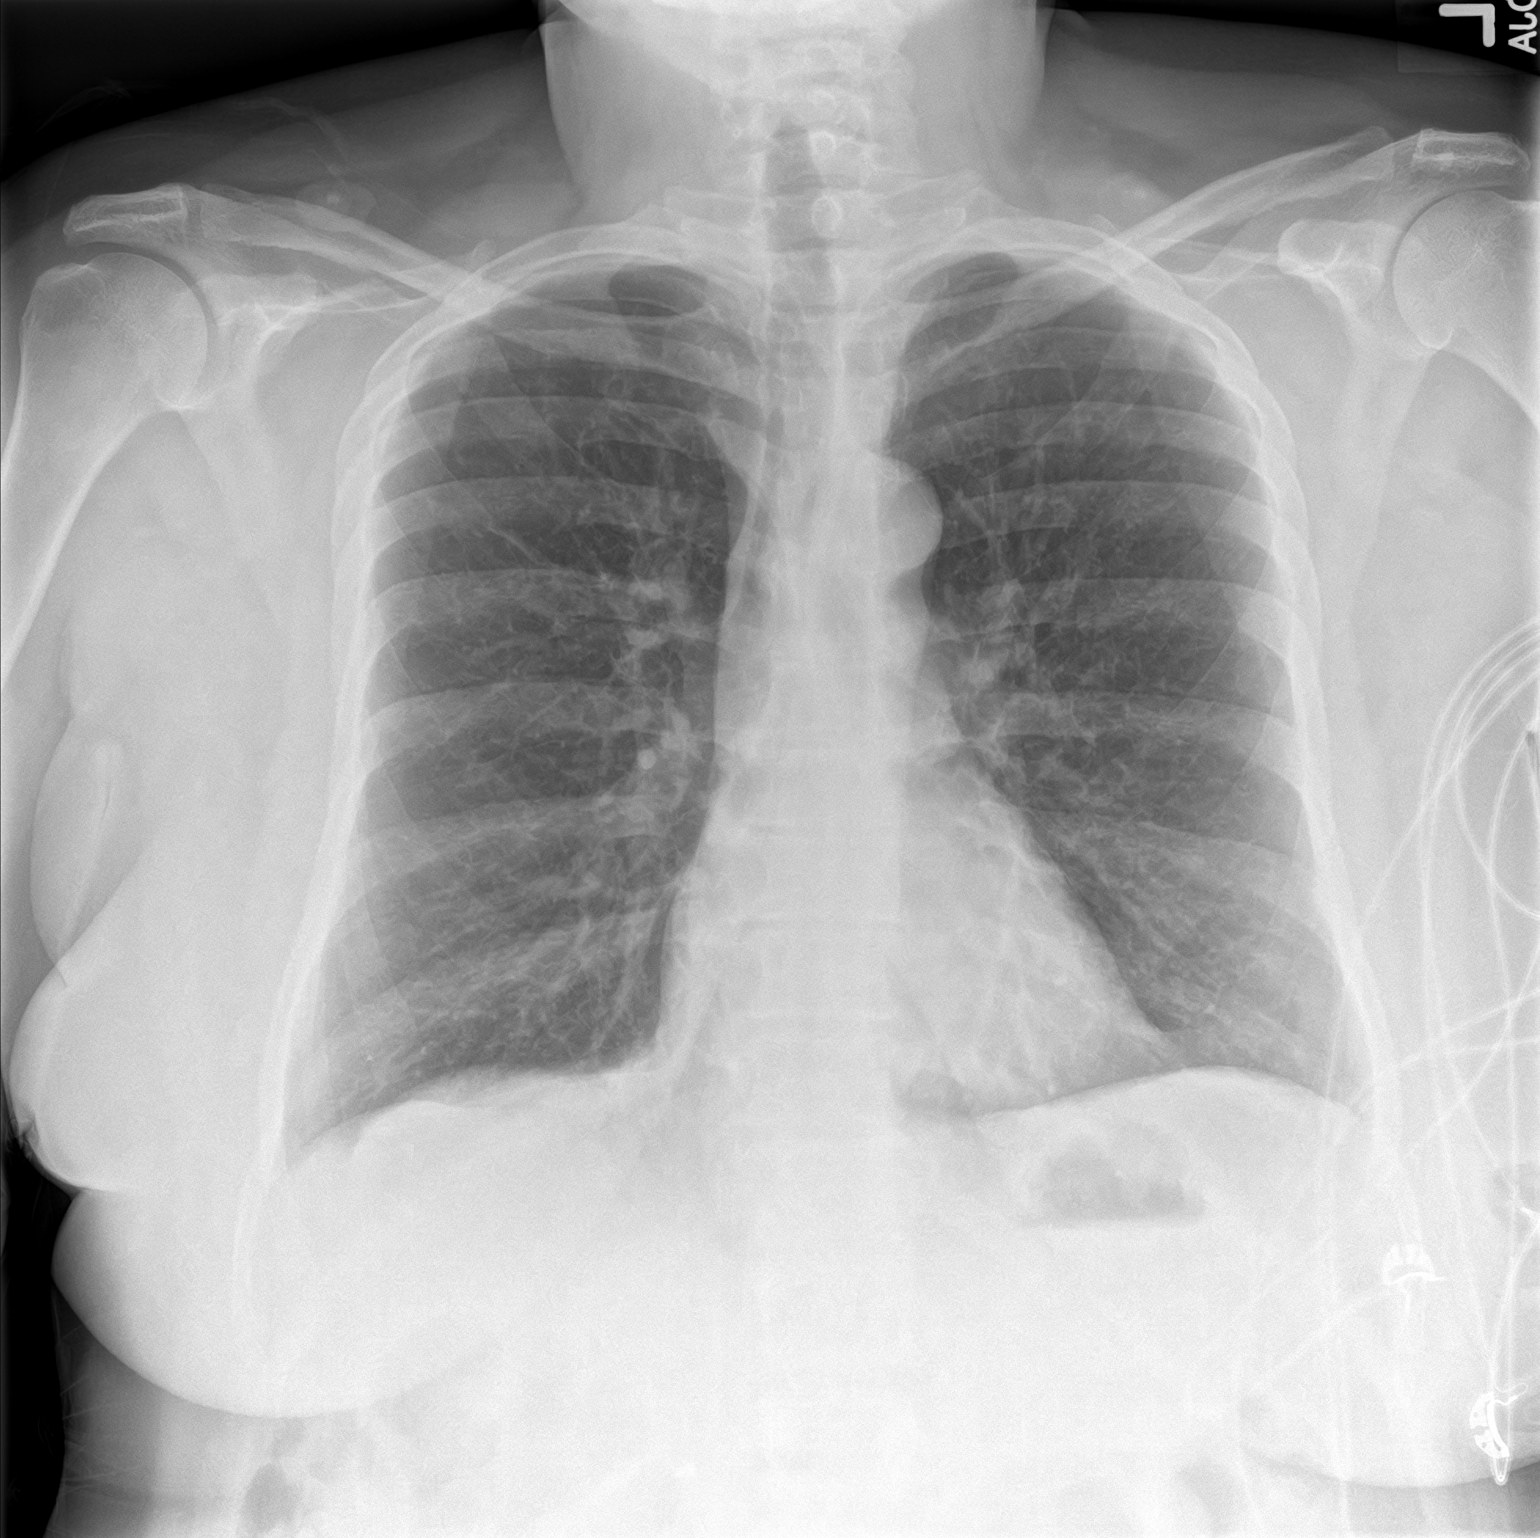
[im 2/2]
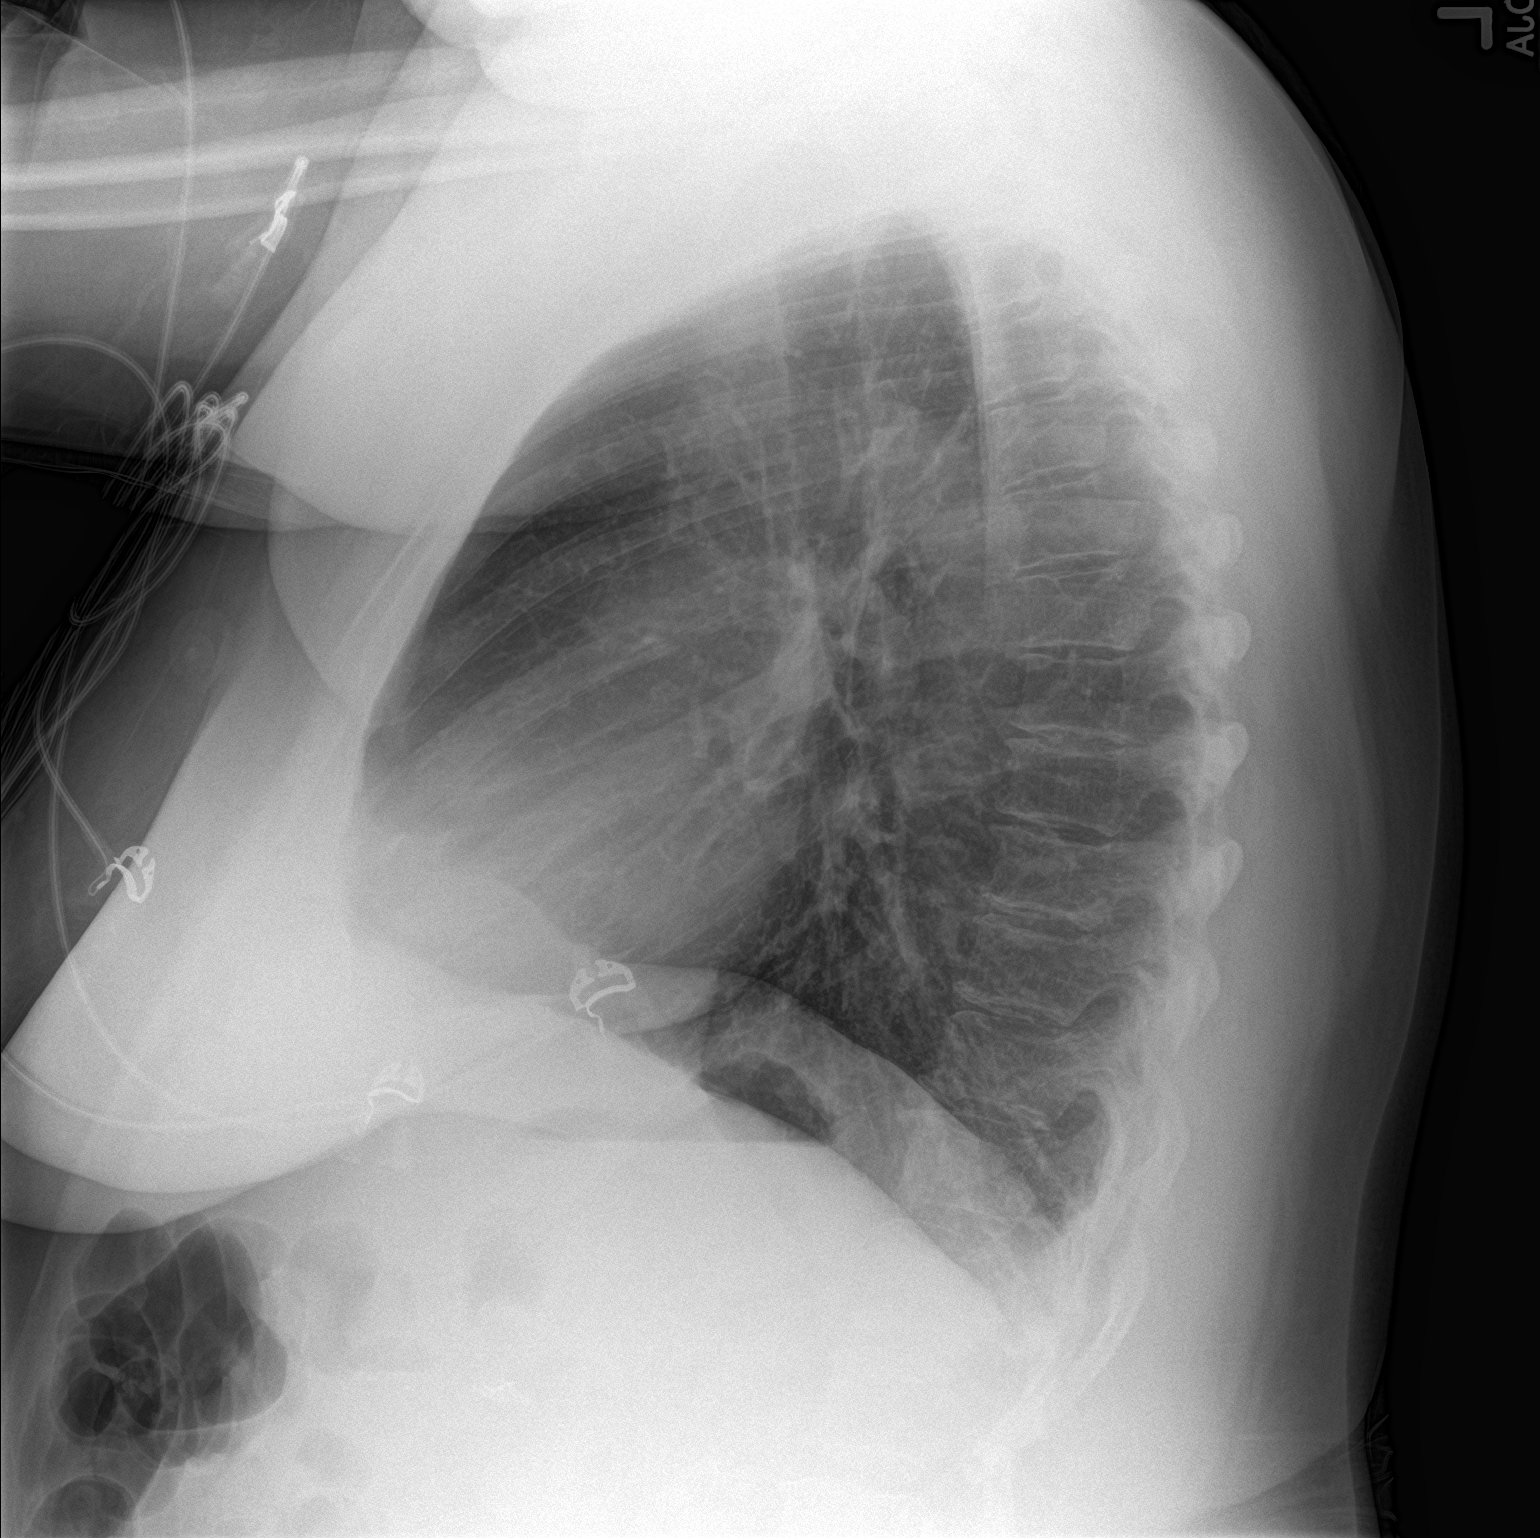

[2 of 2 positions shown; findings below may reference images not displayed]

FINDINGS: Normal heart size, mediastinal contours, and pulmonary vascularity.

Mild chronic bronchitic changes.

Lungs otherwise clear.

No pleural effusion or pneumothorax.

No acute bony abnormalities.
IMPRESSION: Chronic bronchitic changes without infiltrate.

## 2019-04-11 DIAGNOSIS — J449 Chronic obstructive pulmonary disease, unspecified: Secondary | ICD-10-CM | POA: Diagnosis not present

## 2019-04-13 DIAGNOSIS — J449 Chronic obstructive pulmonary disease, unspecified: Secondary | ICD-10-CM | POA: Diagnosis not present

## 2019-04-16 DIAGNOSIS — J9621 Acute and chronic respiratory failure with hypoxia: Secondary | ICD-10-CM | POA: Diagnosis not present

## 2019-04-16 DIAGNOSIS — J449 Chronic obstructive pulmonary disease, unspecified: Secondary | ICD-10-CM | POA: Diagnosis not present

## 2019-04-16 DIAGNOSIS — J9622 Acute and chronic respiratory failure with hypercapnia: Secondary | ICD-10-CM | POA: Diagnosis not present

## 2019-04-20 IMAGING — CR DG CHEST 2V
1 series · 2 of 2 positions shown · non-contrast
Comparison: PA and lateral chest x-ray December 04, 2017

CLINICAL DATA: Onset of wheezing and shortness breath suddenly last
night not responsive to nebulizer treatments. History of
asthma-COPD, current smoker.

EXAM:
CHEST  2 VIEW

[Series 1: dg chest 2 view · 0.14mm/px · 2 of 2 slices shown]
[im 1/2]
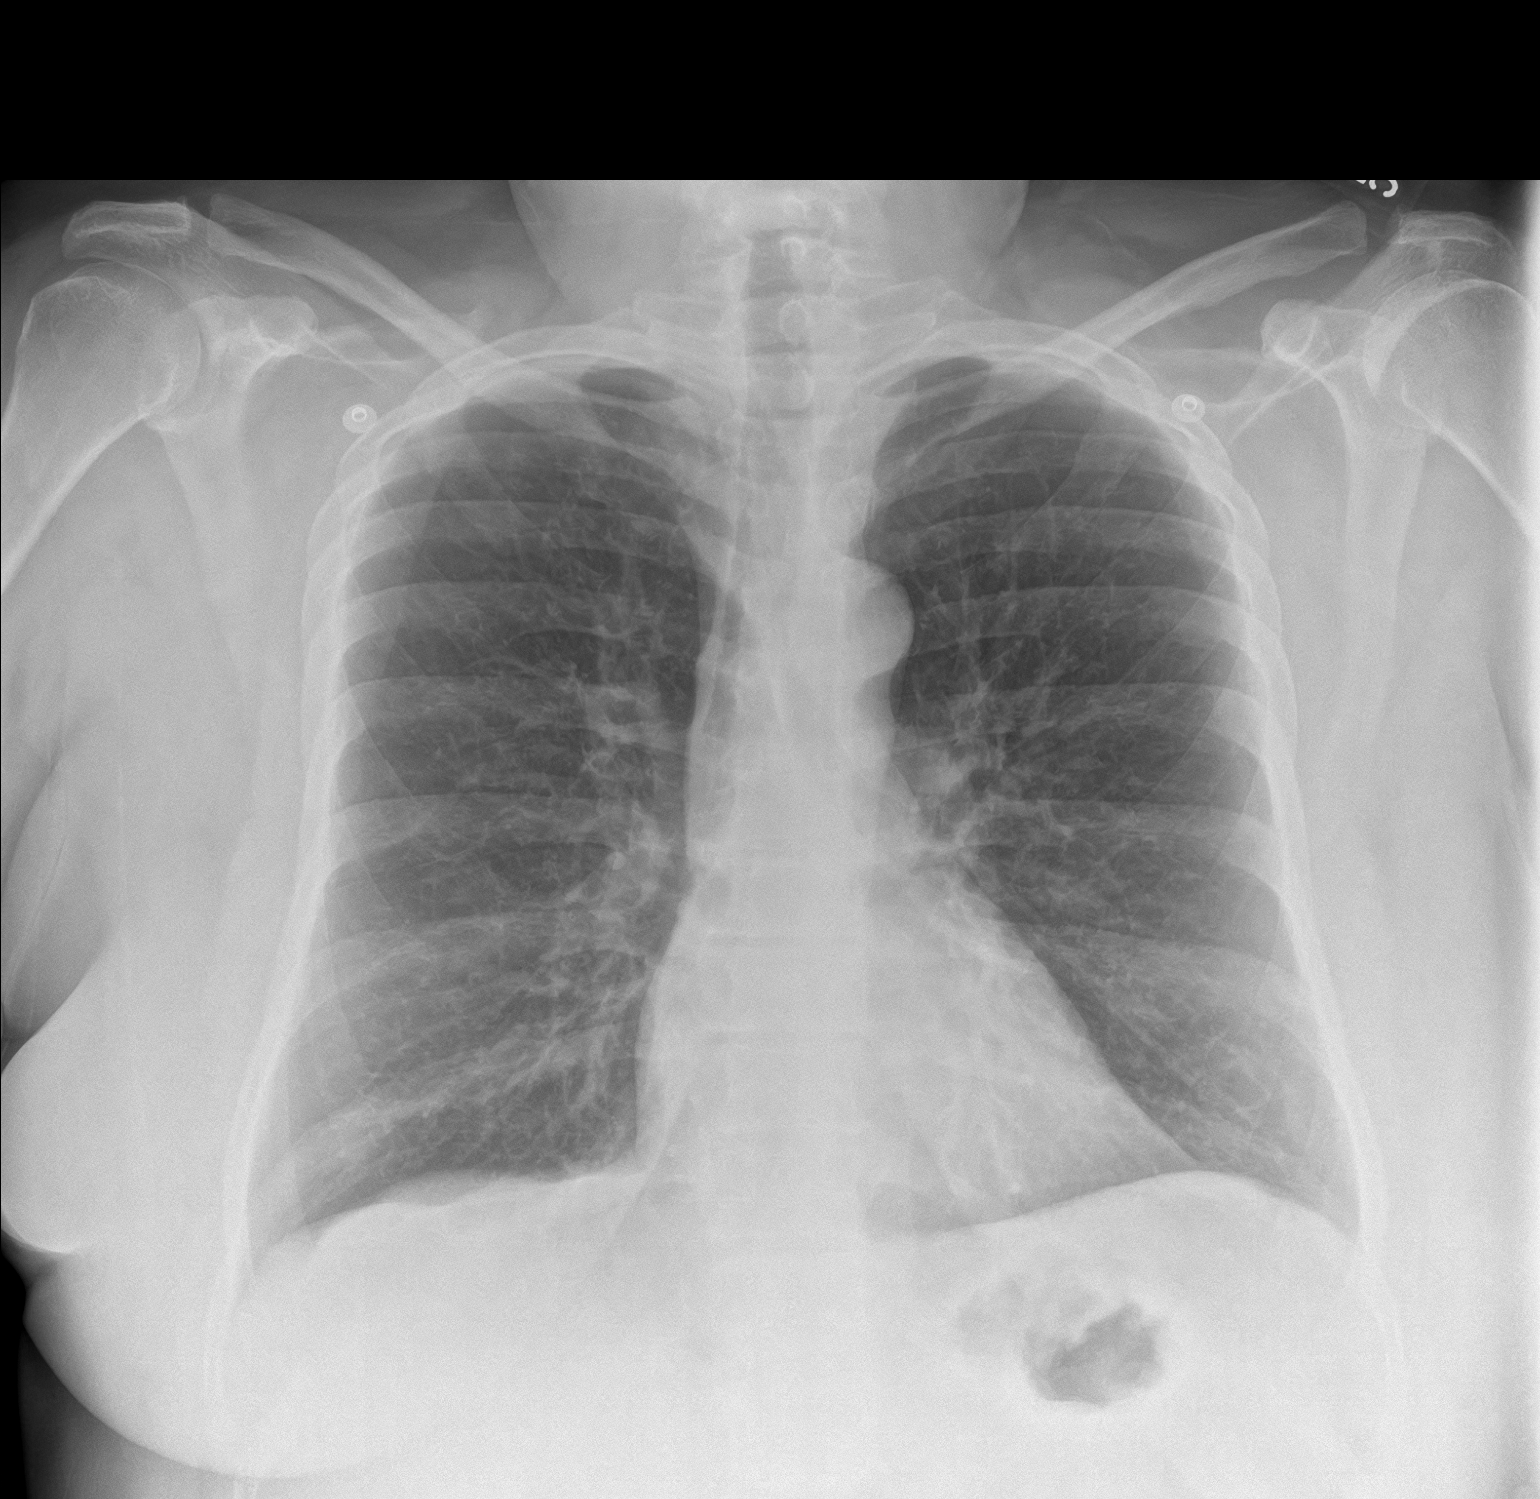
[im 2/2]
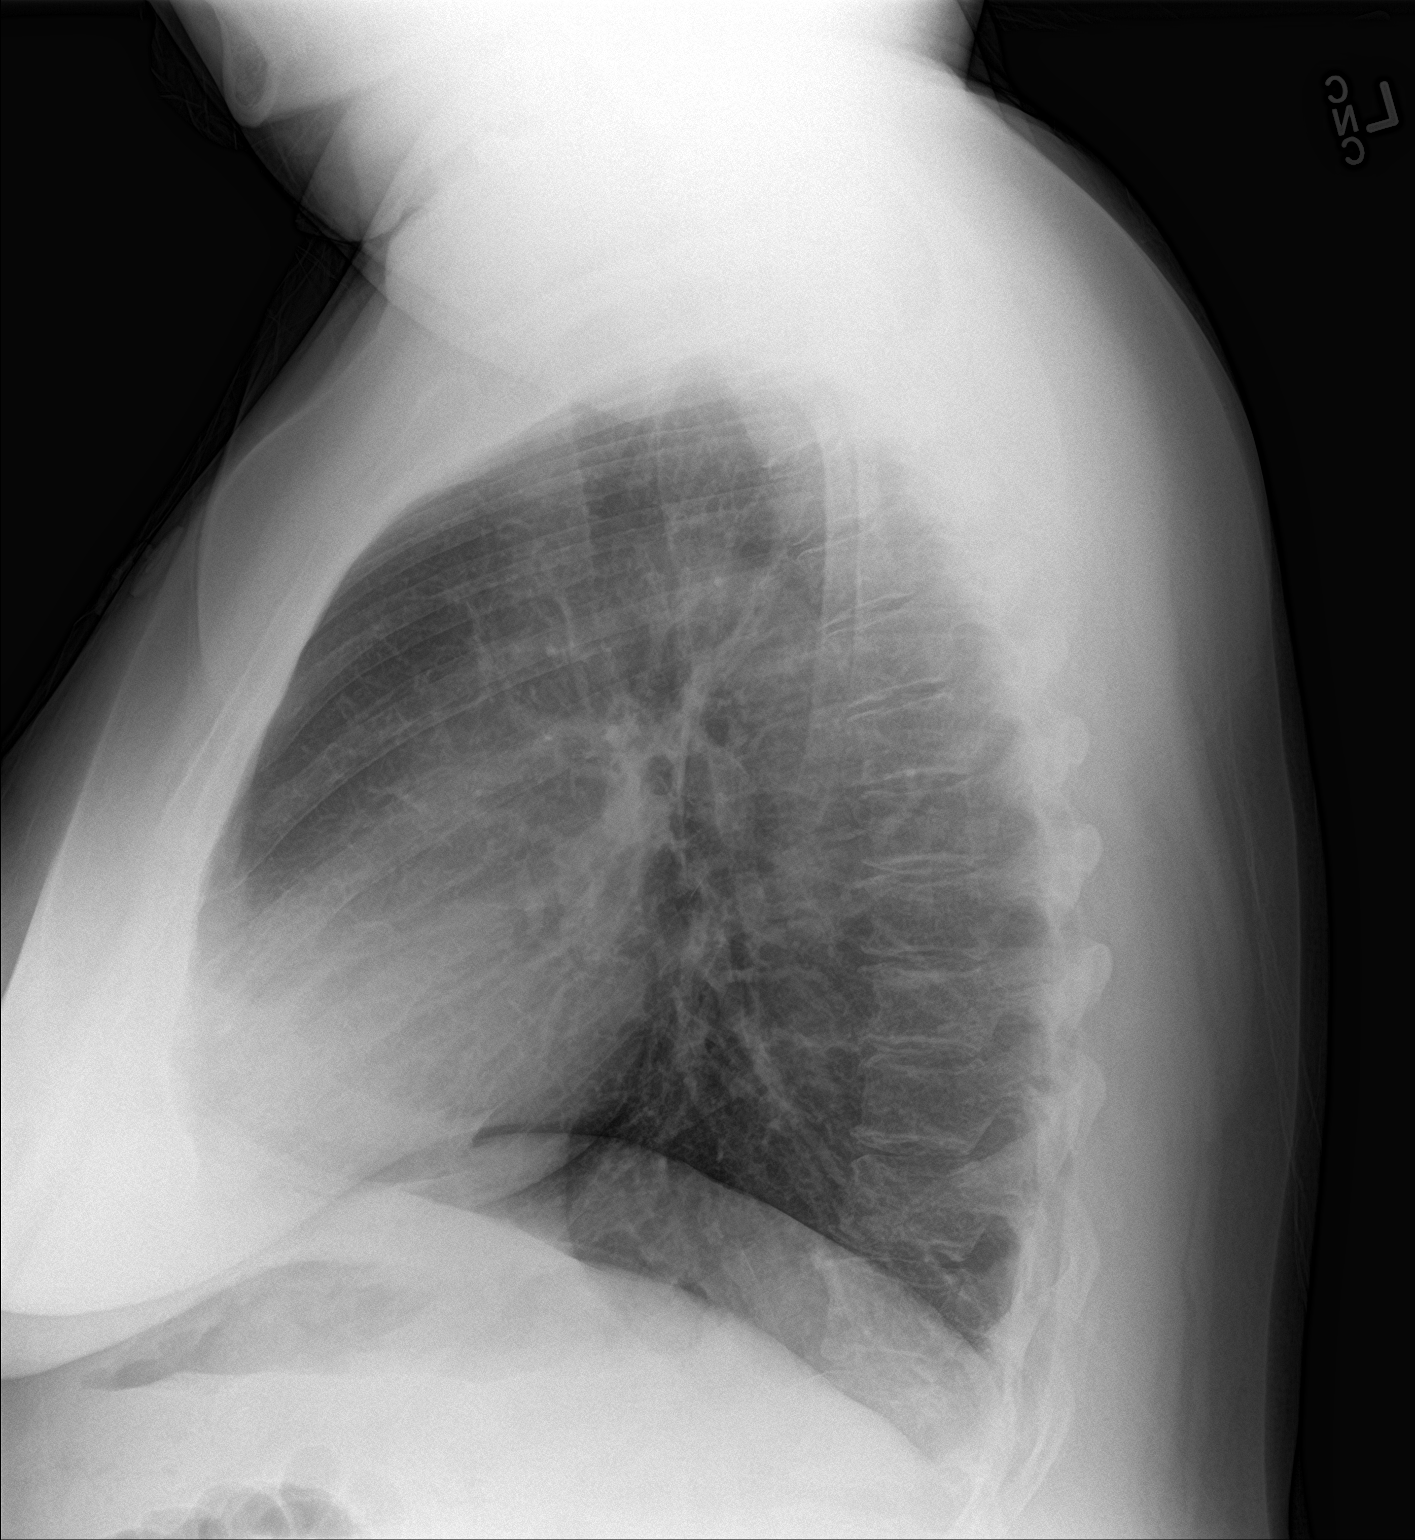

[2 of 2 positions shown; findings below may reference images not displayed]

FINDINGS: The lungs are well-expanded. The interstitial markings remain
increased. There is slightly increased density noted in the right
lower lobe that likely reflects subsegmental atelectasis or early
pneumonia. The heart and pulmonary vascularity are normal. The
mediastinum is normal in width. The bony thorax is unremarkable.
IMPRESSION: COPD. Possible subsegmental atelectasis or early pneumonia in the
right lower lobe. Followup PA and lateral chest X-ray is recommended
in 3-4 weeks following trial of antibiotic therapy to ensure
resolution and exclude underlying malignancy.

## 2019-05-05 DIAGNOSIS — J9601 Acute respiratory failure with hypoxia: Secondary | ICD-10-CM | POA: Diagnosis not present

## 2019-05-05 DIAGNOSIS — J441 Chronic obstructive pulmonary disease with (acute) exacerbation: Secondary | ICD-10-CM | POA: Diagnosis not present

## 2019-05-11 DIAGNOSIS — J449 Chronic obstructive pulmonary disease, unspecified: Secondary | ICD-10-CM | POA: Diagnosis not present

## 2019-05-13 ENCOUNTER — Emergency Department
Admission: EM | Admit: 2019-05-13 | Discharge: 2019-05-13 | Disposition: A | Discharge status: Home / Self Care | Payer: Medicare HMO | Attending: Emergency Medicine | Admitting: Emergency Medicine

## 2019-05-13 ENCOUNTER — Encounter: Payer: Self-pay | Admitting: Emergency Medicine

## 2019-05-13 ENCOUNTER — Emergency Department: Payer: Medicare HMO

## 2019-05-13 ENCOUNTER — Other Ambulatory Visit: Payer: Self-pay

## 2019-05-13 DIAGNOSIS — J45909 Unspecified asthma, uncomplicated: Secondary | ICD-10-CM | POA: Diagnosis not present

## 2019-05-13 DIAGNOSIS — E119 Type 2 diabetes mellitus without complications: Secondary | ICD-10-CM | POA: Diagnosis not present

## 2019-05-13 DIAGNOSIS — Z20828 Contact with and (suspected) exposure to other viral communicable diseases: Secondary | ICD-10-CM | POA: Insufficient documentation

## 2019-05-13 DIAGNOSIS — F17210 Nicotine dependence, cigarettes, uncomplicated: Secondary | ICD-10-CM | POA: Insufficient documentation

## 2019-05-13 DIAGNOSIS — Z7984 Long term (current) use of oral hypoglycemic drugs: Secondary | ICD-10-CM | POA: Diagnosis not present

## 2019-05-13 DIAGNOSIS — I10 Essential (primary) hypertension: Secondary | ICD-10-CM | POA: Insufficient documentation

## 2019-05-13 DIAGNOSIS — J441 Chronic obstructive pulmonary disease with (acute) exacerbation: Secondary | ICD-10-CM

## 2019-05-13 DIAGNOSIS — R062 Wheezing: Secondary | ICD-10-CM | POA: Diagnosis not present

## 2019-05-13 DIAGNOSIS — Z1159 Encounter for screening for other viral diseases: Secondary | ICD-10-CM | POA: Diagnosis not present

## 2019-05-13 DIAGNOSIS — R0602 Shortness of breath: Secondary | ICD-10-CM | POA: Diagnosis not present

## 2019-05-13 DIAGNOSIS — R069 Unspecified abnormalities of breathing: Secondary | ICD-10-CM | POA: Diagnosis not present

## 2019-05-13 LAB — COMPREHENSIVE METABOLIC PANEL
ALT: 26 U/L (ref 0–44)
AST: 17 U/L (ref 15–41)
Albumin: 3.9 g/dL (ref 3.5–5.0)
Alkaline Phosphatase: 79 U/L (ref 38–126)
Anion gap: 9 (ref 5–15)
BUN: 20 mg/dL (ref 6–20)
CO2: 21 mmol/L — ABNORMAL LOW (ref 22–32)
Calcium: 9.2 mg/dL (ref 8.9–10.3)
Chloride: 109 mmol/L (ref 98–111)
Creatinine, Ser: 1.5 mg/dL — ABNORMAL HIGH (ref 0.44–1.00)
GFR calc Af Amer: 45 mL/min — ABNORMAL LOW (ref >60)
GFR calc non Af Amer: 39 mL/min — ABNORMAL LOW (ref >60)
Glucose, Bld: 122 mg/dL — ABNORMAL HIGH (ref 70–99)
Potassium: 4.3 mmol/L (ref 3.5–5.1)
Sodium: 139 mmol/L (ref 135–145)
Total Bilirubin: 0.5 mg/dL (ref 0.3–1.2)
Total Protein: 7.3 g/dL (ref 6.5–8.1)

## 2019-05-13 LAB — BLOOD GAS, VENOUS
Acid-Base Excess: 2.7 mmol/L — ABNORMAL HIGH (ref 0.0–2.0)
Bicarbonate: 28.9 mmol/L — ABNORMAL HIGH (ref 20.0–28.0)
O2 Saturation: 65.2 %
Patient temperature: 37
pCO2, Ven: 50 mmHg (ref 44.0–60.0)
pH, Ven: 7.37 (ref 7.250–7.430)
pO2, Ven: 35 mmHg (ref 32.0–45.0)

## 2019-05-13 LAB — CBC WITH DIFFERENTIAL/PLATELET
Abs Immature Granulocytes: 0.02 10*3/uL (ref 0.00–0.07)
Basophils Absolute: 0.1 10*3/uL (ref 0.0–0.1)
Basophils Relative: 1 %
Eosinophils Absolute: 0.4 10*3/uL (ref 0.0–0.5)
Eosinophils Relative: 4 %
HCT: 45.6 % (ref 36.0–46.0)
Hemoglobin: 14.5 g/dL (ref 12.0–15.0)
Immature Granulocytes: 0 %
Lymphocytes Relative: 36 %
Lymphs Abs: 3.4 10*3/uL (ref 0.7–4.0)
MCH: 28.4 pg (ref 26.0–34.0)
MCHC: 31.8 g/dL (ref 30.0–36.0)
MCV: 89.4 fL (ref 80.0–100.0)
Monocytes Absolute: 0.5 10*3/uL (ref 0.1–1.0)
Monocytes Relative: 6 %
Neutro Abs: 5 10*3/uL (ref 1.7–7.7)
Neutrophils Relative %: 53 %
Platelets: 363 10*3/uL (ref 150–400)
RBC: 5.1 MIL/uL (ref 3.87–5.11)
RDW: 13.8 % (ref 11.5–15.5)
WBC: 9.5 10*3/uL (ref 4.0–10.5)
nRBC: 0 % (ref 0.0–0.2)

## 2019-05-13 LAB — BRAIN NATRIURETIC PEPTIDE: B Natriuretic Peptide: 24 pg/mL (ref 0.0–100.0)

## 2019-05-13 LAB — SARS CORONAVIRUS 2 BY RT PCR (HOSPITAL ORDER, PERFORMED IN ~~LOC~~ HOSPITAL LAB): SARS Coronavirus 2: NEGATIVE

## 2019-05-13 LAB — LIPASE, BLOOD: Lipase: 19 U/L (ref 11–51)

## 2019-05-13 LAB — TROPONIN I: Troponin I: <0.03 ng/mL (ref <0.03)

## 2019-05-13 MED ORDER — PREDNISONE 50 MG PO TABS: 50.0000 mg | ORAL_TABLET | Freq: Every day | ORAL | 0 refills | Status: DC

## 2019-05-13 MED ORDER — METHYLPREDNISOLONE SODIUM SUCC 125 MG IJ SOLR
125.0000 mg | Freq: Once | INTRAMUSCULAR | Status: AC
  Administered 2019-05-13: 07:00:00 125 mg via INTRAVENOUS
  Filled 2019-05-13: qty 2

## 2019-05-13 MED ORDER — AZITHROMYCIN 250 MG PO TABS: ORAL_TABLET | ORAL | 0 refills | Status: AC

## 2019-05-13 NOTE — ED Provider Notes (Signed)
[facility] Emergency Department Provider Note   ____________________________________________    I have reviewed the triage vital signs and the nursing notes.   HISTORY  Chief Complaint Shortness of Breath     HPI Samantha [name] is a 53 y.o. female with history of COPD, diabetes, hypertension, on home O2 and CPAP presents today with shortness of breath.  Patient reports that this is been worsening over the last several days, she feels that she needs steroids which typically help her tremendously.  She denies fevers or chills.  No myalgias.  No exposure to COVID-19 positive patients that she knows of.  She has been trying to self quarantine as best as she can.  No calf pain or swelling  Past Medical History:  Diagnosis Date  . Acute on chronic respiratory failure with hypoxemia (HCC) 08/30/2015  . Asthma   . COPD (chronic obstructive pulmonary disease) (HCC)   . Diabetes mellitus without complication (HCC)   . Hypertension     Patient Active Problem List   Diagnosis Date Noted  . Acute respiratory failure with hypoxemia (HCC) 11/25/2018  . COPD exacerbation (HCC) 02/21/2018  . Acute on chronic respiratory failure (HCC) 10/06/2015  . Acute on chronic respiratory failure with hypoxemia (HCC) 08/30/2015    Past Surgical History:  Procedure Laterality Date  . none      Prior to Admission medications   Medication Sig Start Date End Date Taking? Authorizing Provider  albuterol (PROVENTIL HFA;VENTOLIN HFA) 108 (90 Base) MCG/ACT inhaler Inhale 2 puffs into the lungs every 6 (six) hours as needed for wheezing or shortness of breath. 08/01/18   Shaune Pollack, MD  azithromycin (ZITHROMAX Z-PAK) 250 MG tablet Take 2 tablets (500 mg) on  Day 1,  followed by 1 tablet (250 mg) once daily on Days 2 through 5. 05/13/19 05/18/19  Jene Every, MD  budesonide-formoterol [facility]) 160-4.5 MCG/ACT inhaler Inhale 2 puffs into the lungs 2 (two) times daily. 10/04/18    Governor Rooks, MD  gabapentin (NEURONTIN) 300 MG capsule Take 300 mg by mouth 2 (two) times daily.    [provider]  glimepiride (AMARYL) 4 MG tablet Take 4 mg by mouth daily.    [provider]  ipratropium-albuterol (DUONEB) 0.5-2.5 (3) MG/3ML SOLN Take 3 mLs by nebulization every 6 (six) hours as needed (shortness of breath).    [provider]  losartan-hydrochlorothiazide (HYZAAR) 50-12.5 MG tablet Take 1 tablet by mouth daily. 02/22/16   [provider]  metFORMIN (GLUCOPHAGE) 850 MG tablet Take 850 mg by mouth 2 (two) times daily with a meal.  12/11/15   [provider]  montelukast (SINGULAIR) 10 MG tablet Take 10 mg by mouth at bedtime.     [provider]  predniSONE (DELTASONE) 50 MG tablet Take 1 tablet (50 mg total) by mouth daily with breakfast. 05/13/19   Jene Every, MD  tiotropium (SPIRIVA) 18 MCG inhalation capsule Place 18 mcg into inhaler and inhale daily.    [provider]     Allergies Percocet [oxycodone-acetaminophen]  Family History  Problem Relation Age of Onset  . Asthma Mother   . Diabetes Mother   . Hypertension Mother   . Asthma Sister   . Heart attack Father     Social History Social History   Tobacco Use  . Smoking status: Current Every Day Smoker    Packs/day: 0.10    Years: 23.00    Pack years: 2.30    Types: Cigarettes  .  Smokeless tobacco: Never Used  Substance Use Topics  . Alcohol use: Yes  . Drug use: Yes    Types: Cocaine    Comment: x 1 week ago    Review of Systems  Constitutional: No fever/chills Eyes: No visual changes.  ENT: No sore throat. Cardiovascular: Denies chest pain. Respiratory:  as above Gastrointestinal: No abdominal pain.  No nausea, no vomiting.   Genitourinary: Negative for dysuria. Musculoskeletal: Negative for back pain. Skin: Negative for rash. Neurological: Negative for headaches   ____________________________________________    PHYSICAL EXAM:  VITAL SIGNS: ED Triage Vitals  Enc Vitals Group     BP 05/13/19 0602 (!) 141/97     Pulse Rate 05/13/19 0602 82     Resp 05/13/19 0602 18     Temp 05/13/19 0602 97.8 F (36.6 C)     Temp Source 05/13/19 0602 Axillary     SpO2 05/13/19 0602 100 %     Weight 05/13/19 0600 116.1 kg (256 lb)     Height 05/13/19 0600 1.702 m (5\' 7" )     Head Circumference --      Peak Flow --      Pain Score 05/13/19 0600 8     Pain Loc --      Pain Edu? --      Excl. in GC? --     Constitutional: Alert and oriented. Eyes: Conjunctivae are normal.   Nose: No congestion/rhinnorhea. Mouth/Throat: Mucous membranes are moist.    Cardiovascular: Normal rate, regular rhythm. Grossly normal heart sounds.  Good peripheral circulation. Respiratory: Normal respiratory effort.  No retractions.  Scattered wheezing Gastrointestinal: Soft and nontender. No distention.    Musculoskeletal: No lower extremity tenderness nor edema.  Warm and well perfused Neurologic:  Normal speech and language. No gross focal neurologic deficits are appreciated.  Skin:  Skin is warm, dry and intact. No rash noted. Psychiatric: Mood and affect are normal. Speech and behavior are normal.  ____________________________________________   LABS (all labs ordered are listed, but only abnormal results are displayed)  Labs Reviewed  COMPREHENSIVE METABOLIC PANEL - Abnormal; Notable for the following components:      Result Value   CO2 21 (*)    Glucose, Bld 122 (*)    Creatinine, Ser 1.50 (*)    GFR calc non Af Amer 39 (*)    GFR calc Af Amer 45 (*)    All other components within normal limits  BLOOD GAS, VENOUS - Abnormal; Notable for the following components:   Bicarbonate 28.9 (*)    Acid-Base Excess 2.7 (*)    All other components within normal limits  SARS CORONAVIRUS 2 (HOSPITAL ORDER, PERFORMED IN [facility])  LIPASE, BLOOD  TROPONIN I  BRAIN NATRIURETIC PEPTIDE  CBC WITH  DIFFERENTIAL/PLATELET   ____________________________________________  EKG  ED ECG REPORT I, Jene Every, the attending physician, personally viewed and interpreted this ECG.  Date: 05/13/2019  Rhythm: normal sinus rhythm QRS Axis: normal Intervals: normal ST/T Wave abnormalities: normal Narrative Interpretation: no evidence of acute ischemia  ____________________________________________  RADIOLOGY  Chest x-ray unremarkable ____________________________________________   PROCEDURES  Procedure(s) performed: No  Procedures   Critical Care performed: No ____________________________________________   INITIAL IMPRESSION / ASSESSMENT AND PLAN / ED COURSE  Pertinent labs & imaging results that were available during my care of the patient were reviewed by me and considered in my medical decision making (see chart for details).  Patient well-appearing and in no acute distress, scattered wheezes on  exam.  She states this feels consistent with COPD exacerbation.  She states that she knows that she needs steroids but she has nebulizers nothing else that she needs at home.  Treated here with IV Solu-Medrol which she states made her feel significantly better.  She would like to go home she does not want any nebulizer treatment here does not want admission.  She states that she can do all those things at home.  Given that she is well-appearing and in no acute distress this is an appropriate plan  Mirriam A Monterrosa was evaluated in Emergency Department on 05/13/2019 for the symptoms described in the history of present illness. She was evaluated in the context of the global COVID-19 pandemic, which necessitated consideration that the patient might be at risk for infection with the SARS-CoV-2 virus that causes COVID-19. Institutional protocols and algorithms that pertain to the evaluation of patients at risk for COVID-19 are in a state of rapid change based on information released by regulatory  bodies including the CDC and federal and state organizations. These policies and algorithms were followed during the patient's care in the ED.     ____________________________________________   FINAL CLINICAL IMPRESSION(S) / ED DIAGNOSES  Final diagnoses:  COPD exacerbation (HCC)        Note:  This document was prepared using Dragon voice recognition software and may include unintentional dictation errors.   Jene EveryKinner, Lanelle Lindo, MD 05/13/19 [phone]

## 2019-05-13 NOTE — ED Triage Notes (Signed)
Pt arrives via [facility] with c/o SOB x 2 days. Pt has COPD and states that she has been taking all of her nebs without relief. Pt is able to talk at this time. EMS put pt on non-rebreather and pt states that she has had some relief at this time but knows that she needs steroids.

## 2019-05-14 DIAGNOSIS — J449 Chronic obstructive pulmonary disease, unspecified: Secondary | ICD-10-CM | POA: Diagnosis not present

## 2019-05-17 DIAGNOSIS — J9621 Acute and chronic respiratory failure with hypoxia: Secondary | ICD-10-CM | POA: Diagnosis not present

## 2019-05-17 DIAGNOSIS — J9622 Acute and chronic respiratory failure with hypercapnia: Secondary | ICD-10-CM | POA: Diagnosis not present

## 2019-05-17 DIAGNOSIS — J449 Chronic obstructive pulmonary disease, unspecified: Secondary | ICD-10-CM | POA: Diagnosis not present

## 2019-06-01 IMAGING — DX DG CHEST 1V PORT
1 series · 1 of 1 positions shown · non-contrast
Comparison: 12/19/2017 and earlier.

CLINICAL DATA: 51-year-old female with increase shortness of breath
and wheezing for 3 days.

EXAM:
PORTABLE CHEST 1 VIEW

[chest ap]
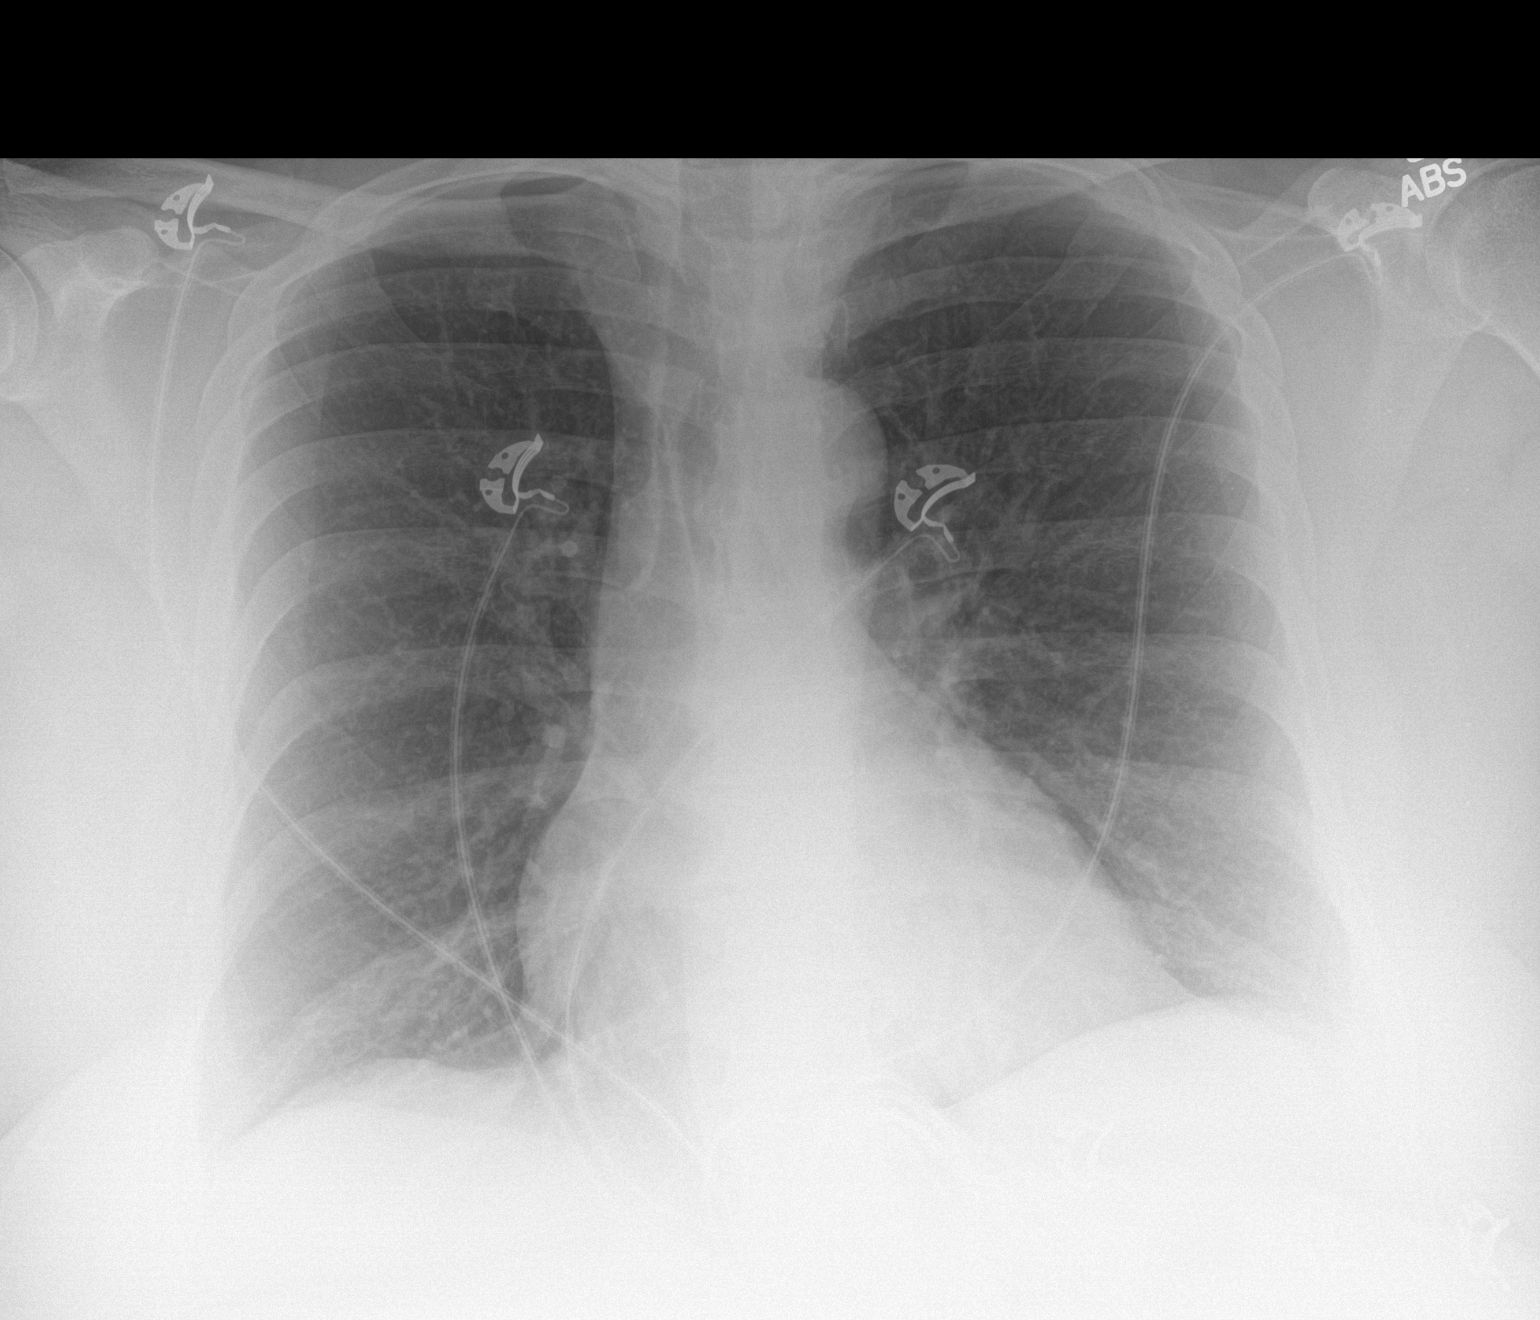

[1 of 1 positions shown; findings below may reference images not displayed]

FINDINGS: Portable AP upright view at 6866 hrs. Stable lung volumes.
Mediastinal contours remain normal. Visualized tracheal air column
is within normal limits. Allowing for portable technique the lungs
are clear. No pneumothorax.
IMPRESSION: No acute cardiopulmonary abnormality.

## 2019-06-05 DIAGNOSIS — J9601 Acute respiratory failure with hypoxia: Secondary | ICD-10-CM | POA: Diagnosis not present

## 2019-06-05 DIAGNOSIS — J441 Chronic obstructive pulmonary disease with (acute) exacerbation: Secondary | ICD-10-CM | POA: Diagnosis not present

## 2019-06-11 DIAGNOSIS — J449 Chronic obstructive pulmonary disease, unspecified: Secondary | ICD-10-CM | POA: Diagnosis not present

## 2019-06-13 DIAGNOSIS — J449 Chronic obstructive pulmonary disease, unspecified: Secondary | ICD-10-CM | POA: Diagnosis not present

## 2019-06-16 DIAGNOSIS — J9622 Acute and chronic respiratory failure with hypercapnia: Secondary | ICD-10-CM | POA: Diagnosis not present

## 2019-06-16 DIAGNOSIS — J449 Chronic obstructive pulmonary disease, unspecified: Secondary | ICD-10-CM | POA: Diagnosis not present

## 2019-06-16 DIAGNOSIS — J9621 Acute and chronic respiratory failure with hypoxia: Secondary | ICD-10-CM | POA: Diagnosis not present

## 2019-06-23 IMAGING — DX DG CHEST 1V PORT
1 series · 1 of 1 positions shown · non-contrast
Comparison: January 30, 2018

CLINICAL DATA: Shortness of Breath

EXAM:
PORTABLE CHEST 1 VIEW

[chest ap]
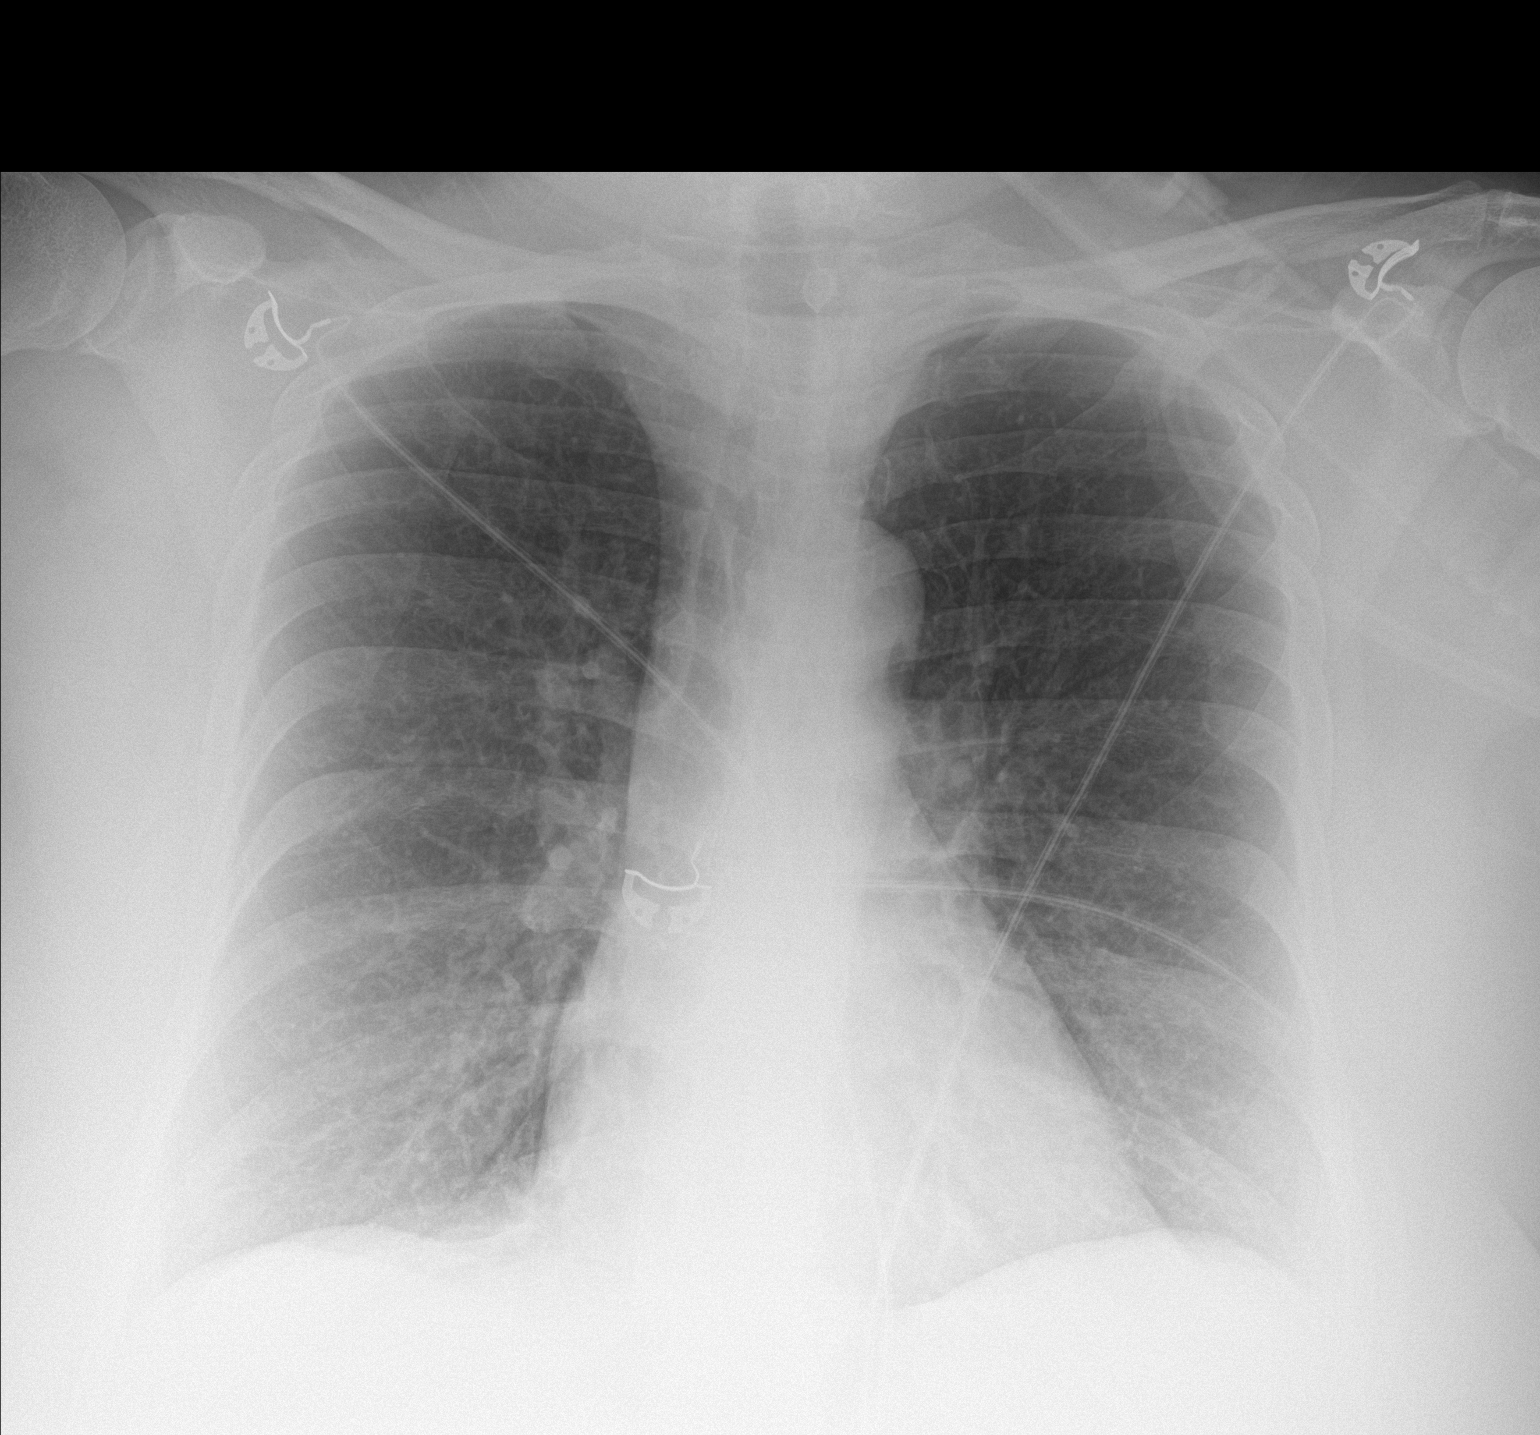

[1 of 1 positions shown; findings below may reference images not displayed]

FINDINGS: There is no appreciable edema or consolidation. Heart size and
pulmonary vascularity are normal. No adenopathy. No evident bone
lesions.
IMPRESSION: No edema or consolidation.

## 2019-07-05 DIAGNOSIS — J441 Chronic obstructive pulmonary disease with (acute) exacerbation: Secondary | ICD-10-CM | POA: Diagnosis not present

## 2019-07-05 DIAGNOSIS — J9601 Acute respiratory failure with hypoxia: Secondary | ICD-10-CM | POA: Diagnosis not present

## 2019-07-11 DIAGNOSIS — J449 Chronic obstructive pulmonary disease, unspecified: Secondary | ICD-10-CM | POA: Diagnosis not present

## 2019-07-14 DIAGNOSIS — J449 Chronic obstructive pulmonary disease, unspecified: Secondary | ICD-10-CM | POA: Diagnosis not present

## 2019-07-15 DIAGNOSIS — G4733 Obstructive sleep apnea (adult) (pediatric): Secondary | ICD-10-CM | POA: Diagnosis not present

## 2019-07-15 DIAGNOSIS — K469 Unspecified abdominal hernia without obstruction or gangrene: Secondary | ICD-10-CM | POA: Diagnosis not present

## 2019-07-15 DIAGNOSIS — J9621 Acute and chronic respiratory failure with hypoxia: Secondary | ICD-10-CM | POA: Diagnosis not present

## 2019-07-15 DIAGNOSIS — J449 Chronic obstructive pulmonary disease, unspecified: Secondary | ICD-10-CM | POA: Diagnosis not present

## 2019-07-17 DIAGNOSIS — J449 Chronic obstructive pulmonary disease, unspecified: Secondary | ICD-10-CM | POA: Diagnosis not present

## 2019-07-17 DIAGNOSIS — J9622 Acute and chronic respiratory failure with hypercapnia: Secondary | ICD-10-CM | POA: Diagnosis not present

## 2019-07-17 DIAGNOSIS — J9621 Acute and chronic respiratory failure with hypoxia: Secondary | ICD-10-CM | POA: Diagnosis not present

## 2019-08-05 DIAGNOSIS — J9601 Acute respiratory failure with hypoxia: Secondary | ICD-10-CM | POA: Diagnosis not present

## 2019-08-05 DIAGNOSIS — J441 Chronic obstructive pulmonary disease with (acute) exacerbation: Secondary | ICD-10-CM | POA: Diagnosis not present

## 2019-08-11 DIAGNOSIS — J449 Chronic obstructive pulmonary disease, unspecified: Secondary | ICD-10-CM | POA: Diagnosis not present

## 2019-08-14 DIAGNOSIS — J449 Chronic obstructive pulmonary disease, unspecified: Secondary | ICD-10-CM | POA: Diagnosis not present

## 2019-08-17 DIAGNOSIS — J9621 Acute and chronic respiratory failure with hypoxia: Secondary | ICD-10-CM | POA: Diagnosis not present

## 2019-08-17 DIAGNOSIS — J449 Chronic obstructive pulmonary disease, unspecified: Secondary | ICD-10-CM | POA: Diagnosis not present

## 2019-08-17 DIAGNOSIS — J9622 Acute and chronic respiratory failure with hypercapnia: Secondary | ICD-10-CM | POA: Diagnosis not present

## 2019-09-05 DIAGNOSIS — J9601 Acute respiratory failure with hypoxia: Secondary | ICD-10-CM | POA: Diagnosis not present

## 2019-09-05 DIAGNOSIS — J441 Chronic obstructive pulmonary disease with (acute) exacerbation: Secondary | ICD-10-CM | POA: Diagnosis not present

## 2019-09-09 ENCOUNTER — Observation Stay
Admission: EM | Admit: 2019-09-09 | Discharge: 2019-09-10 | Disposition: A | Discharge status: Home / Self Care | Payer: Medicare HMO | Attending: Surgery | Admitting: Surgery

## 2019-09-09 ENCOUNTER — Encounter: Payer: Self-pay | Admitting: *Deleted

## 2019-09-09 ENCOUNTER — Other Ambulatory Visit: Payer: Self-pay

## 2019-09-09 DIAGNOSIS — K436 Other and unspecified ventral hernia with obstruction, without gangrene: Principal | ICD-10-CM | POA: Insufficient documentation

## 2019-09-09 DIAGNOSIS — E119 Type 2 diabetes mellitus without complications: Secondary | ICD-10-CM | POA: Diagnosis not present

## 2019-09-09 DIAGNOSIS — Z885 Allergy status to narcotic agent status: Secondary | ICD-10-CM | POA: Insufficient documentation

## 2019-09-09 DIAGNOSIS — Z79899 Other long term (current) drug therapy: Secondary | ICD-10-CM | POA: Diagnosis not present

## 2019-09-09 DIAGNOSIS — R1084 Generalized abdominal pain: Secondary | ICD-10-CM

## 2019-09-09 DIAGNOSIS — K429 Umbilical hernia without obstruction or gangrene: Secondary | ICD-10-CM | POA: Diagnosis not present

## 2019-09-09 DIAGNOSIS — Z7984 Long term (current) use of oral hypoglycemic drugs: Secondary | ICD-10-CM | POA: Diagnosis not present

## 2019-09-09 DIAGNOSIS — E669 Obesity, unspecified: Secondary | ICD-10-CM | POA: Insufficient documentation

## 2019-09-09 DIAGNOSIS — Z7952 Long term (current) use of systemic steroids: Secondary | ICD-10-CM | POA: Diagnosis not present

## 2019-09-09 DIAGNOSIS — F17210 Nicotine dependence, cigarettes, uncomplicated: Secondary | ICD-10-CM | POA: Diagnosis not present

## 2019-09-09 DIAGNOSIS — Z6841 Body Mass Index (BMI) 40.0 and over, adult: Secondary | ICD-10-CM | POA: Insufficient documentation

## 2019-09-09 DIAGNOSIS — R52 Pain, unspecified: Secondary | ICD-10-CM | POA: Diagnosis not present

## 2019-09-09 DIAGNOSIS — K439 Ventral hernia without obstruction or gangrene: Secondary | ICD-10-CM | POA: Diagnosis not present

## 2019-09-09 DIAGNOSIS — J449 Chronic obstructive pulmonary disease, unspecified: Secondary | ICD-10-CM | POA: Insufficient documentation

## 2019-09-09 DIAGNOSIS — Z20828 Contact with and (suspected) exposure to other viral communicable diseases: Secondary | ICD-10-CM | POA: Diagnosis not present

## 2019-09-09 DIAGNOSIS — I10 Essential (primary) hypertension: Secondary | ICD-10-CM | POA: Diagnosis not present

## 2019-09-09 DIAGNOSIS — Z7951 Long term (current) use of inhaled steroids: Secondary | ICD-10-CM | POA: Diagnosis not present

## 2019-09-09 DIAGNOSIS — Z03818 Encounter for observation for suspected exposure to other biological agents ruled out: Secondary | ICD-10-CM | POA: Diagnosis not present

## 2019-09-09 MED ORDER — SODIUM CHLORIDE 0.9% FLUSH: 3.0000 mL | Freq: Once | INTRAVENOUS | Status: DC

## 2019-09-09 MED ORDER — SODIUM CHLORIDE 0.9 % IV BOLUS
1000.0000 mL | Freq: Once | INTRAVENOUS | Status: AC
  Administered 2019-09-09: 1000 mL via INTRAVENOUS

## 2019-09-09 MED ORDER — IOHEXOL 9 MG/ML PO SOLN: 500.0000 mL | Freq: Two times a day (BID) | ORAL | Status: DC | PRN

## 2019-09-09 MED ORDER — ONDANSETRON HCL 4 MG/2ML IJ SOLN
4.0000 mg | Freq: Once | INTRAMUSCULAR | Status: AC
  Administered 2019-09-09: 4 mg via INTRAVENOUS
  Filled 2019-09-09: qty 2

## 2019-09-09 MED ORDER — MORPHINE SULFATE (PF) 4 MG/ML IV SOLN
4.0000 mg | Freq: Once | INTRAVENOUS | Status: AC
  Administered 2019-09-09: 4 mg via INTRAVENOUS
  Filled 2019-09-09: qty 1

## 2019-09-09 NOTE — ED Triage Notes (Signed)
Pt brought in via ems from home.  Pt has abd pain.  Hx hernia.  Pt reports increased pain.  Denies n/v/d.  Pt alert.  Pt moaning out.

## 2019-09-09 NOTE — ED Provider Notes (Signed)
[facility] Emergency Department Provider Note   ____________________________________________   First MD Initiated Contact with Patient 09/09/19 2311     (approximate)  I have reviewed the triage vital signs and the nursing notes.   HISTORY  Chief Complaint Abdominal Pain    HPI Samantha [name] is a 53 y.o. female who presents to the ED from home with a chief complaint of abdominal pain.  Patient has a known umbilical hernia x6 months.  Abdomen began hurting yesterday but increased today before noon.  Symptoms associated with nausea, no vomiting.  Patient noticed her hernia would not reduce today.  Denies fever, cough, chest pain, shortness of breath, dysuria, diarrhea.       Past Medical History:  Diagnosis Date   Acute on chronic respiratory failure with hypoxemia ([place]) 08/30/2015   Asthma    COPD (chronic obstructive pulmonary disease) (HCC)    Diabetes mellitus without complication ([place])    Hypertension     Patient Active Problem List   Diagnosis Date Noted   Acute respiratory failure with hypoxemia ([place]) 11/25/2018   COPD exacerbation ([place]) 02/21/2018   Acute on chronic respiratory failure ([place]) 10/06/2015   Acute on chronic respiratory failure with hypoxemia ([place]) 08/30/2015    Past Surgical History:  Procedure Laterality Date   none      Prior to Admission medications   Medication Sig Start Date End Date Taking? Authorizing Provider  albuterol (PROVENTIL HFA;VENTOLIN HFA) 108 (90 Base) MCG/ACT inhaler Inhale 2 puffs into the lungs every 6 (six) hours as needed for wheezing or shortness of breath. 08/01/18  Yes Demetrios Loll, MD  ipratropium-albuterol (DUONEB) 0.5-2.5 (3) MG/3ML SOLN Take 3 mLs by nebulization every 6 (six) hours as needed (shortness of breath).   Yes [provider]  budesonide-formoterol (SYMBICORT) 160-4.5 MCG/ACT inhaler Inhale 2 puffs into the lungs 2 (two) times daily. 10/04/18   Lisa Roca, MD    gabapentin (NEURONTIN) 300 MG capsule Take 300 mg by mouth 2 (two) times daily.    [provider]  glimepiride (AMARYL) 4 MG tablet Take 4 mg by mouth daily.    [provider]  losartan-hydrochlorothiazide (HYZAAR) 50-12.5 MG tablet Take 1 tablet by mouth daily. 02/22/16   [provider]  metFORMIN (GLUCOPHAGE) 850 MG tablet Take 850 mg by mouth 2 (two) times daily with a meal.  12/11/15   [provider]  montelukast (SINGULAIR) 10 MG tablet Take 10 mg by mouth at bedtime.     [provider]  omeprazole (PRILOSEC) 20 MG capsule Take 1 capsule by mouth daily. 05/05/19   [provider]  predniSONE (DELTASONE) 50 MG tablet Take 1 tablet (50 mg total) by mouth daily with breakfast. 05/13/19   Lavonia Drafts, MD  tiotropium (SPIRIVA) 18 MCG inhalation capsule Place 18 mcg into inhaler and inhale daily.    [provider]    Allergies Percocet [oxycodone-acetaminophen]  Family History  Problem Relation Age of Onset   Asthma Mother    Diabetes Mother    Hypertension Mother    Asthma Sister    Heart attack Father     Social History Social History   Tobacco Use   Smoking status: Current Every Day Smoker    Packs/day: 0.10    Years: 23.00    Pack years: 2.30    Types: Cigarettes   Smokeless tobacco: Never Used  Substance Use Topics   Alcohol use: Yes   Drug use: Yes  Types: Cocaine    Comment: x 1 week ago    Review of Systems  Constitutional: No fever/chills Eyes: No visual changes. ENT: No sore throat. Cardiovascular: Denies chest pain. Respiratory: Denies shortness of breath. Gastrointestinal: Positive for abdominal pain and nausea, no vomiting.  No diarrhea.  No constipation. Genitourinary: Negative for dysuria. Musculoskeletal: Negative for back pain. Skin: Negative for rash. Neurological: Negative for headaches, focal weakness or  numbness.   ____________________________________________   PHYSICAL EXAM:  VITAL SIGNS: ED Triage Vitals  Enc Vitals Group     BP 09/09/19 2022 (!) 120/100     Pulse Rate 09/09/19 2022 (!) 105     Resp 09/09/19 2022 (!) 22     Temp 09/09/19 2022 98.9 F (37.2 C)     Temp Source 09/09/19 2022 Oral     SpO2 09/09/19 2022 95 %     Weight --      Height --      Head Circumference --      Peak Flow --      Pain Score 09/09/19 2023 10     Pain Loc --      Pain Edu? --      Excl. in GC? --     Constitutional: Alert and oriented. Well appearing and in mild to moderate acute distress. Eyes: Conjunctivae are normal. PERRL. EOMI. Head: Atraumatic. Nose: No congestion/rhinnorhea. Mouth/Throat: Mucous membranes are moist.  Oropharynx non-erythematous. Neck: No stridor.   Cardiovascular: Normal rate, regular rhythm. Grossly normal heart sounds.  Good peripheral circulation. Respiratory: Normal respiratory effort.  No retractions. Lungs CTAB. Gastrointestinal: Soft and moderately tender to palpation.  Large umbilical/ventral hernia which is unable to be reduced at bedside.  No distention. No abdominal bruits. No CVA tenderness. Musculoskeletal: No lower extremity tenderness nor edema.  No joint effusions. Neurologic:  Normal speech and language. No gross focal neurologic deficits are appreciated. No gait instability. Skin:  Skin is warm, dry and intact. No rash noted. Psychiatric: Mood and affect are normal. Speech and behavior are normal.  ____________________________________________   LABS (all labs ordered are listed, but only abnormal results are displayed)  Labs Reviewed  COMPREHENSIVE METABOLIC PANEL - Abnormal; Notable for the following components:      Result Value   Glucose, Bld 114 (*)    Creatinine, Ser 1.62 (*)    GFR calc non Af Amer 36 (*)    GFR calc Af Amer 41 (*)    All other components within normal limits  CBC - Abnormal; Notable for the following  components:   WBC 12.7 (*)    All other components within normal limits  SARS CORONAVIRUS 2 (HOSPITAL ORDER, PERFORMED IN [facility])  LIPASE, BLOOD  LACTIC ACID, PLASMA  URINALYSIS, COMPLETE (UACMP) WITH MICROSCOPIC  PROTIME-INR  URINE DRUG SCREEN, QUALITATIVE ([facility] ONLY)  TYPE AND SCREEN  TYPE AND SCREEN   ____________________________________________  EKG  ED ECG REPORT I, Rayel Santizo J, the attending physician, personally viewed and interpreted this ECG.   Date: 09/09/2019  EKG Time: 0030  Rate: 88  Rhythm: normal EKG, normal sinus rhythm  Axis: Normal  Intervals:none  ST&T Change: Nonspecific  ____________________________________________  RADIOLOGY  ED MD interpretation: Incarcerated ventral hernia  Official radiology report(s): Ct Abdomen Pelvis W Contrast  Result Date: 09/10/2019 CLINICAL DATA:  Incarcerated hernia EXAM: CT ABDOMEN AND PELVIS WITH CONTRAST TECHNIQUE: Multidetector CT imaging of the abdomen and pelvis was performed using the standard protocol following bolus administration of intravenous contrast.  CONTRAST:  100mL OMNIPAQUE IOHEXOL 300 MG/ML  SOLN COMPARISON:  None. FINDINGS: Lower chest: Linear atelectasis in both lower lobes, left greater than right. No pleural fluid. Hepatobiliary: Mild hepatic steatosis without focal lesion. Clips in the gallbladder fossa postcholecystectomy. No biliary dilatation. Pancreas: No ductal dilatation or inflammation. Spleen: Normal in size without focal abnormality. Adrenals/Urinary Tract: Nonspecific 10 mm left adrenal nodule. Normal right adrenal gland. Mild left renal scarring. No hydronephrosis or perinephric edema. Small cyst in the upper right kidney. Symmetric excretion on delayed phase imaging. Urinary bladder is unremarkable. Stomach/Bowel: Supraumbilical ventral abdominal wall hernia contains short segment of transverse colon. There is adjacent free fluid and stranding within the hernia sac, with wall  thickening of the colonic segments within the hernia neck. The hernia neck measures 4.2 x 3.8 cm in transverse by AP dimension. Moderate volume of stool throughout the colon both proximal and distal to the umbilical hernia. No small bowel dilatation, inflammation, or obstruction. Stomach is nondistended. Normal appendix. Vascular/Lymphatic: Moderate aorto bi-iliac atherosclerosis. No aneurysm. No mesenteric or portal venous gas. No enlarged lymph nodes in the abdomen or pelvis. Reproductive: Uterus and bilateral adnexa are unremarkable. Other: Supraumbilical ventral abdominal wall hernia containing short segment of transverse colon as described above. There is also a small fat containing umbilical hernia. And upper abdominal ventral abdominal wall hernia contains minimal free fluid without inflammatory tori change. There is mesenteric edema at the supraumbilical hernia. No ascites in the abdomen or pelvis. No free air or perforation. Musculoskeletal: Avascular necrosis of both femoral heads. Transitional lumbosacral anatomy. There are no acute or suspicious osseous abnormalities. IMPRESSION: 1. Supraumbilical ventral abdominal wall hernia contains transverse colon with associated inflammatory change suspicious for incarceration. There is inflammatory changes in free fluid in the hernia sac as well as mesenteric edema. No perforation or bowel obstruction. 2. Small additional fat containing umbilical and upper abdominal wall hernias. 3. Mild hepatic steatosis. 4. Avascular necrosis of both femoral heads. 5. Nonspecific 10 mm left adrenal nodule, likely an adenoma but no prior exams to available for comparison. Aortic Atherosclerosis (ICD10-I70.0). Electronically Signed   By: Narda RutherfordMelanie  Sanford M.D.   On: 09/10/2019 02:13    ____________________________________________   PROCEDURES  Procedure(s) performed (including Critical Care):  Procedures  CRITICAL CARE Performed by: Irean HongSUNG,Krishav Mamone J   Total critical care  time: 30 minutes  Critical care time was exclusive of separately billable procedures and treating other patients.  Critical care was necessary to treat or prevent imminent or life-threatening deterioration.  Critical care was time spent personally by me on the following activities: development of treatment plan with patient and/or surrogate as well as nursing, discussions with consultants, evaluation of patient's response to treatment, examination of patient, obtaining history from patient or surrogate, ordering and performing treatments and interventions, ordering and review of laboratory studies, ordering and review of radiographic studies, pulse oximetry and re-evaluation of patient's condition. ____________________________________________   INITIAL IMPRESSION / ASSESSMENT AND PLAN / ED COURSE  As part of my medical decision making, I reviewed the following data within the electronic MEDICAL RECORD NUMBER Nursing notes reviewed and incorporated, Labs reviewed, EKG interpreted, Old chart reviewed, Radiograph reviewed and Notes from prior ED visits     Samantha [name] was evaluated in Emergency Department on 09/10/2019 for the symptoms described in the history of present illness. She was evaluated in the context of the global COVID-19 pandemic, which necessitated consideration that the patient might be at risk for infection with the SARS-CoV-2 virus that  causes COVID-19. Institutional protocols and algorithms that pertain to the evaluation of patients at risk for COVID-19 are in a state of rapid change based on information released by regulatory bodies including the CDC and federal and state organizations. These policies and algorithms were followed during the patient's care in the ED.    53 year old female with known umbilical/ventral hernia who presents with abdominal pain and large hernia unable to be reduced. Differential diagnosis includes, but is not limited to, incarcerated hernia, ovarian cyst,  ovarian torsion, acute appendicitis, diverticulitis, urinary tract infection/pyelonephritis, endometriosis, bowel obstruction, colitis, renal colic, gastroenteritis, hernia, fibroids, endometriosis, etc.   Clinical Course as of Sep 09 324  Tue Sep 10, 2019  0222 Updated patient of CT scan.  Discussed with Dr. Tonna Boehringer from general surgery who will evaluate patient in the emergency department.   [JS]    Clinical Course User Index [JS] Irean Hong, MD     ____________________________________________   FINAL CLINICAL IMPRESSION(S) / ED DIAGNOSES  Final diagnoses:  Generalized abdominal pain  Incarcerated ventral hernia     ED Discharge Orders    None       Note:  This document was prepared using Dragon voice recognition software and may include unintentional dictation errors.   Irean Hong, MD 09/10/19 [phone]

## 2019-09-10 ENCOUNTER — Encounter
Admission: EM | Disposition: A | Discharge status: Home / Self Care | Payer: Self-pay | Source: Home / Self Care | Attending: Emergency Medicine

## 2019-09-10 ENCOUNTER — Encounter: Payer: Self-pay | Admitting: Anesthesiology

## 2019-09-10 ENCOUNTER — Emergency Department: Payer: Medicare HMO

## 2019-09-10 ENCOUNTER — Encounter: Payer: Self-pay | Admitting: Radiology

## 2019-09-10 DIAGNOSIS — K439 Ventral hernia without obstruction or gangrene: Secondary | ICD-10-CM | POA: Diagnosis not present

## 2019-09-10 DIAGNOSIS — F1490 Cocaine use, unspecified, uncomplicated: Secondary | ICD-10-CM | POA: Diagnosis not present

## 2019-09-10 DIAGNOSIS — K436 Other and unspecified ventral hernia with obstruction, without gangrene: Secondary | ICD-10-CM | POA: Diagnosis present

## 2019-09-10 DIAGNOSIS — K429 Umbilical hernia without obstruction or gangrene: Secondary | ICD-10-CM | POA: Diagnosis not present

## 2019-09-10 LAB — CBC
HCT: 44.7 % (ref 36.0–46.0)
HCT: 45.6 % (ref 36.0–46.0)
Hemoglobin: 14.4 g/dL (ref 12.0–15.0)
Hemoglobin: 14.6 g/dL (ref 12.0–15.0)
MCH: 28.3 pg (ref 26.0–34.0)
MCH: 28.5 pg (ref 26.0–34.0)
MCHC: 32 g/dL (ref 30.0–36.0)
MCHC: 32.2 g/dL (ref 30.0–36.0)
MCV: 88.3 fL (ref 80.0–100.0)
MCV: 88.5 fL (ref 80.0–100.0)
Platelets: 362 10*3/uL (ref 150–400)
Platelets: 376 10*3/uL (ref 150–400)
RBC: 5.06 MIL/uL (ref 3.87–5.11)
RBC: 5.15 MIL/uL — ABNORMAL HIGH (ref 3.87–5.11)
RDW: 13.3 % (ref 11.5–15.5)
RDW: 13.6 % (ref 11.5–15.5)
WBC: 10.2 10*3/uL (ref 4.0–10.5)
WBC: 12.7 10*3/uL — ABNORMAL HIGH (ref 4.0–10.5)
nRBC: 0 % (ref 0.0–0.2)
nRBC: 0 % (ref 0.0–0.2)

## 2019-09-10 LAB — COMPREHENSIVE METABOLIC PANEL
ALT: 25 U/L (ref 0–44)
AST: 20 U/L (ref 15–41)
Albumin: 4 g/dL (ref 3.5–5.0)
Alkaline Phosphatase: 77 U/L (ref 38–126)
Anion gap: 10 (ref 5–15)
BUN: 20 mg/dL (ref 6–20)
CO2: 25 mmol/L (ref 22–32)
Calcium: 9.3 mg/dL (ref 8.9–10.3)
Chloride: 104 mmol/L (ref 98–111)
Creatinine, Ser: 1.62 mg/dL — ABNORMAL HIGH (ref 0.44–1.00)
GFR calc Af Amer: 41 mL/min — ABNORMAL LOW (ref >60)
GFR calc non Af Amer: 36 mL/min — ABNORMAL LOW (ref >60)
Glucose, Bld: 114 mg/dL — ABNORMAL HIGH (ref 70–99)
Potassium: 3.8 mmol/L (ref 3.5–5.1)
Sodium: 139 mmol/L (ref 135–145)
Total Bilirubin: 0.7 mg/dL (ref 0.3–1.2)
Total Protein: 7.2 g/dL (ref 6.5–8.1)

## 2019-09-10 LAB — URINE DRUG SCREEN, QUALITATIVE (ARMC ONLY)
Amphetamines, Ur Screen: NOT DETECTED
Barbiturates, Ur Screen: NOT DETECTED
Benzodiazepine, Ur Scrn: NOT DETECTED
Cannabinoid 50 Ng, Ur ~~LOC~~: NOT DETECTED
Cocaine Metabolite,Ur ~~LOC~~: POSITIVE — AB
MDMA (Ecstasy)Ur Screen: NOT DETECTED
Methadone Scn, Ur: NOT DETECTED
Opiate, Ur Screen: POSITIVE — AB
Phencyclidine (PCP) Ur S: NOT DETECTED
Tricyclic, Ur Screen: NOT DETECTED

## 2019-09-10 LAB — BASIC METABOLIC PANEL
Anion gap: 11 (ref 5–15)
BUN: 18 mg/dL (ref 6–20)
CO2: 24 mmol/L (ref 22–32)
Calcium: 9.4 mg/dL (ref 8.9–10.3)
Chloride: 103 mmol/L (ref 98–111)
Creatinine, Ser: 1.52 mg/dL — ABNORMAL HIGH (ref 0.44–1.00)
GFR calc Af Amer: 45 mL/min — ABNORMAL LOW (ref >60)
GFR calc non Af Amer: 38 mL/min — ABNORMAL LOW (ref >60)
Glucose, Bld: 88 mg/dL (ref 70–99)
Potassium: 4.1 mmol/L (ref 3.5–5.1)
Sodium: 138 mmol/L (ref 135–145)

## 2019-09-10 LAB — TYPE AND SCREEN
ABO/RH(D): O POS
Antibody Screen: NEGATIVE

## 2019-09-10 LAB — URINALYSIS, COMPLETE (UACMP) WITH MICROSCOPIC
Bacteria, UA: NONE SEEN
Bilirubin Urine: NEGATIVE
Glucose, UA: NEGATIVE mg/dL
Hgb urine dipstick: NEGATIVE
Ketones, ur: NEGATIVE mg/dL
Leukocytes,Ua: NEGATIVE
Nitrite: NEGATIVE
Protein, ur: NEGATIVE mg/dL
Specific Gravity, Urine: 1.03 (ref 1.005–1.030)
pH: 7 (ref 5.0–8.0)

## 2019-09-10 LAB — LACTIC ACID, PLASMA
Lactic Acid, Venous: 0.7 mmol/L (ref 0.5–1.9)
Lactic Acid, Venous: 0.9 mmol/L (ref 0.5–1.9)
Lactic Acid, Venous: 1.3 mmol/L (ref 0.5–1.9)

## 2019-09-10 LAB — GLUCOSE, CAPILLARY
Glucose-Capillary: 126 mg/dL — ABNORMAL HIGH (ref 70–99)
Glucose-Capillary: 85 mg/dL (ref 70–99)

## 2019-09-10 LAB — SARS CORONAVIRUS 2 BY RT PCR (HOSPITAL ORDER, PERFORMED IN ~~LOC~~ HOSPITAL LAB): SARS Coronavirus 2: NEGATIVE

## 2019-09-10 LAB — LIPASE, BLOOD: Lipase: 15 U/L (ref 11–51)

## 2019-09-10 SURGERY — LAPAROTOMY, EXPLORATORY
Anesthesia: General | Site: Abdomen

## 2019-09-10 MED ORDER — GLIMEPIRIDE 4 MG PO TABS
4.0000 mg | ORAL_TABLET | Freq: Every day | ORAL | Status: DC
  Administered 2019-09-10: 09:00:00 4 mg via ORAL
  Filled 2019-09-10: qty 1

## 2019-09-10 MED ORDER — LIDOCAINE HCL (PF) 2 % IJ SOLN
INTRAMUSCULAR | Status: AC
  Filled 2019-09-10: qty 10

## 2019-09-10 MED ORDER — PANTOPRAZOLE SODIUM 40 MG PO TBEC
40.0000 mg | DELAYED_RELEASE_TABLET | Freq: Every day | ORAL | Status: DC
  Administered 2019-09-10: 09:00:00 40 mg via ORAL
  Filled 2019-09-10: qty 1

## 2019-09-10 MED ORDER — BUPIVACAINE LIPOSOME 1.3 % IJ SUSP
INTRAMUSCULAR | Status: AC
  Filled 2019-09-10: qty 20

## 2019-09-10 MED ORDER — IPRATROPIUM-ALBUTEROL 0.5-2.5 (3) MG/3ML IN SOLN: 3.0000 mL | Freq: Four times a day (QID) | RESPIRATORY_TRACT | Status: DC | PRN

## 2019-09-10 MED ORDER — PHENYLEPHRINE HCL (PRESSORS) 10 MG/ML IV SOLN
INTRAVENOUS | Status: AC
  Filled 2019-09-10: qty 1

## 2019-09-10 MED ORDER — METFORMIN HCL 850 MG PO TABS
850.0000 mg | ORAL_TABLET | Freq: Two times a day (BID) | ORAL | Status: DC
  Filled 2019-09-10 (×2): qty 1

## 2019-09-10 MED ORDER — SUCCINYLCHOLINE CHLORIDE 20 MG/ML IJ SOLN
INTRAMUSCULAR | Status: AC
  Filled 2019-09-10: qty 1

## 2019-09-10 MED ORDER — ACETAMINOPHEN 325 MG PO TABS
650.0000 mg | ORAL_TABLET | Freq: Four times a day (QID) | ORAL | Status: DC | PRN
  Administered 2019-09-10: 06:00:00 650 mg via ORAL
  Filled 2019-09-10: qty 2

## 2019-09-10 MED ORDER — FLUTICASONE FUROATE-VILANTEROL 200-25 MCG/INH IN AEPB
1.0000 | INHALATION_SPRAY | Freq: Every day | RESPIRATORY_TRACT | Status: DC
  Administered 2019-09-10: 1 via RESPIRATORY_TRACT
  Filled 2019-09-10 (×2): qty 28

## 2019-09-10 MED ORDER — CEFAZOLIN SODIUM 1 G IJ SOLR
INTRAMUSCULAR | Status: AC
  Filled 2019-09-10: qty 20

## 2019-09-10 MED ORDER — SODIUM CHLORIDE (PF) 0.9 % IJ SOLN
INTRAMUSCULAR | Status: AC
  Filled 2019-09-10: qty 100

## 2019-09-10 MED ORDER — IOHEXOL 300 MG/ML  SOLN
100.0000 mL | Freq: Once | INTRAMUSCULAR | Status: AC | PRN
  Administered 2019-09-10: 100 mL via INTRAVENOUS

## 2019-09-10 MED ORDER — MORPHINE SULFATE (PF) 4 MG/ML IV SOLN
4.0000 mg | Freq: Once | INTRAVENOUS | Status: AC
  Administered 2019-09-10: 4 mg via INTRAVENOUS

## 2019-09-10 MED ORDER — ONDANSETRON HCL 4 MG/2ML IJ SOLN
INTRAMUSCULAR | Status: AC
  Filled 2019-09-10: qty 2

## 2019-09-10 MED ORDER — MORPHINE SULFATE (PF) 4 MG/ML IV SOLN
4.0000 mg | Freq: Once | INTRAVENOUS | Status: AC
  Administered 2019-09-10: 4 mg via INTRAVENOUS
  Filled 2019-09-10: qty 1

## 2019-09-10 MED ORDER — MIDAZOLAM HCL 2 MG/2ML IJ SOLN
INTRAMUSCULAR | Status: AC
  Filled 2019-09-10: qty 2

## 2019-09-10 MED ORDER — ROCURONIUM BROMIDE 50 MG/5ML IV SOLN
INTRAVENOUS | Status: AC
  Filled 2019-09-10: qty 1

## 2019-09-10 MED ORDER — DEXAMETHASONE SODIUM PHOSPHATE 10 MG/ML IJ SOLN
INTRAMUSCULAR | Status: AC
  Filled 2019-09-10: qty 1

## 2019-09-10 MED ORDER — ONDANSETRON 4 MG PO TBDP: 4.0000 mg | ORAL_TABLET | Freq: Four times a day (QID) | ORAL | Status: DC | PRN

## 2019-09-10 MED ORDER — INSULIN ASPART 100 UNIT/ML ~~LOC~~ SOLN
0.0000 [IU] | Freq: Three times a day (TID) | SUBCUTANEOUS | Status: DC
  Filled 2019-09-10: qty 1

## 2019-09-10 MED ORDER — GABAPENTIN 300 MG PO CAPS
300.0000 mg | ORAL_CAPSULE | Freq: Two times a day (BID) | ORAL | Status: DC
  Administered 2019-09-10: 09:00:00 300 mg via ORAL
  Filled 2019-09-10: qty 1

## 2019-09-10 MED ORDER — ALBUTEROL SULFATE (2.5 MG/3ML) 0.083% IN NEBU
2.5000 mg | INHALATION_SOLUTION | Freq: Four times a day (QID) | RESPIRATORY_TRACT | Status: DC | PRN
  Administered 2019-09-10: 2.5 mg via RESPIRATORY_TRACT
  Filled 2019-09-10: qty 3

## 2019-09-10 MED ORDER — TIOTROPIUM BROMIDE MONOHYDRATE 18 MCG IN CAPS
18.0000 ug | ORAL_CAPSULE | Freq: Every day | RESPIRATORY_TRACT | Status: DC
  Administered 2019-09-10: 18 ug via RESPIRATORY_TRACT
  Filled 2019-09-10: qty 5

## 2019-09-10 MED ORDER — FENTANYL CITRATE (PF) 250 MCG/5ML IJ SOLN
INTRAMUSCULAR | Status: AC
  Filled 2019-09-10: qty 5

## 2019-09-10 MED ORDER — ACETAMINOPHEN 325 MG PO TABS: 650.0000 mg | ORAL_TABLET | Freq: Three times a day (TID) | ORAL | 0 refills | Status: AC | PRN

## 2019-09-10 MED ORDER — ONDANSETRON HCL 4 MG/2ML IJ SOLN: 4.0000 mg | Freq: Four times a day (QID) | INTRAMUSCULAR | Status: DC | PRN

## 2019-09-10 MED ORDER — PREDNISONE 20 MG PO TABS
50.0000 mg | ORAL_TABLET | Freq: Every day | ORAL | Status: DC
  Administered 2019-09-10: 50 mg via ORAL
  Filled 2019-09-10: qty 3

## 2019-09-10 MED ORDER — MORPHINE SULFATE (PF) 4 MG/ML IV SOLN
INTRAVENOUS | Status: AC
  Filled 2019-09-10: qty 1

## 2019-09-10 MED ORDER — MONTELUKAST SODIUM 10 MG PO TABS: 10.0000 mg | ORAL_TABLET | Freq: Every day | ORAL | Status: DC

## 2019-09-10 MED ORDER — HYDROCHLOROTHIAZIDE 12.5 MG PO CAPS
12.5000 mg | ORAL_CAPSULE | Freq: Every day | ORAL | Status: DC
  Administered 2019-09-10: 09:00:00 12.5 mg via ORAL
  Filled 2019-09-10: qty 1

## 2019-09-10 MED ORDER — DEXTROSE 50 % IV SOLN: 12.5000 g | INTRAVENOUS | Status: DC

## 2019-09-10 MED ORDER — LOSARTAN POTASSIUM 50 MG PO TABS
50.0000 mg | ORAL_TABLET | Freq: Every day | ORAL | Status: DC
  Administered 2019-09-10: 50 mg via ORAL
  Filled 2019-09-10: qty 1

## 2019-09-10 MED ORDER — ONDANSETRON HCL 4 MG/2ML IJ SOLN
4.0000 mg | Freq: Once | INTRAMUSCULAR | Status: AC
  Administered 2019-09-10: 4 mg via INTRAVENOUS
  Filled 2019-09-10: qty 2

## 2019-09-10 MED ORDER — BUPIVACAINE-EPINEPHRINE (PF) 0.5% -1:200000 IJ SOLN
INTRAMUSCULAR | Status: AC
  Filled 2019-09-10: qty 30

## 2019-09-10 MED ORDER — BUPIVACAINE HCL (PF) 0.25 % IJ SOLN
INTRAMUSCULAR | Status: AC
  Filled 2019-09-10: qty 30

## 2019-09-10 MED ORDER — LOSARTAN POTASSIUM-HCTZ 50-12.5 MG PO TABS: 1.0000 | ORAL_TABLET | Freq: Every day | ORAL | Status: DC

## 2019-09-10 MED ORDER — PROPOFOL 10 MG/ML IV BOLUS
INTRAVENOUS | Status: AC
  Filled 2019-09-10: qty 20

## 2019-09-10 MED ORDER — ONDANSETRON HCL 4 MG/2ML IJ SOLN
4.0000 mg | Freq: Once | INTRAMUSCULAR | Status: AC
  Administered 2019-09-10: 4 mg via INTRAVENOUS

## 2019-09-10 MED ORDER — BUPIVACAINE HCL (PF) 0.5 % IJ SOLN
INTRAMUSCULAR | Status: AC
  Filled 2019-09-10: qty 30

## 2019-09-10 MED ORDER — LACTATED RINGERS IV SOLN
INTRAVENOUS | Status: DC
  Administered 2019-09-10: 05:00:00 via INTRAVENOUS

## 2019-09-10 SURGICAL SUPPLY — 62 items
APPLIER CLIP 13 LRG OPEN (CLIP) ×4
BLADE SURG 15 STRL LF DISP TIS (BLADE) ×2 IMPLANT
BLADE SURG 15 STRL SS (BLADE) ×2
BLADE SURG SZ10 CARB STEEL (BLADE) ×4 IMPLANT
CANISTER SUCT 1200ML W/VALVE (MISCELLANEOUS) ×4 IMPLANT
CHLORAPREP W/TINT 26 (MISCELLANEOUS) ×4 IMPLANT
CLIP APPLIE 13 LRG OPEN (CLIP) ×2 IMPLANT
COVER WAND RF STERILE (DRAPES) ×4 IMPLANT
DERMABOND ADVANCED (GAUZE/BANDAGES/DRESSINGS) ×2
DERMABOND ADVANCED .7 DNX12 (GAUZE/BANDAGES/DRESSINGS) ×2 IMPLANT
DRAPE LAPAROTOMY 100X77 ABD (DRAPES) ×4 IMPLANT
DRSG OPSITE POSTOP 4X10 (GAUZE/BANDAGES/DRESSINGS) ×4 IMPLANT
DRSG OPSITE POSTOP 4X8 (GAUZE/BANDAGES/DRESSINGS) ×4 IMPLANT
DRSG TEGADERM 4X10 (GAUZE/BANDAGES/DRESSINGS) ×4 IMPLANT
DRSG TELFA 3X8 NADH (GAUZE/BANDAGES/DRESSINGS) ×4 IMPLANT
ELECT BLADE 6 FLAT ULTRCLN (ELECTRODE) IMPLANT
ELECT REM PT RETURN 9FT ADLT (ELECTROSURGICAL) ×8
ELECTRODE REM PT RTRN 9FT ADLT (ELECTROSURGICAL) ×4 IMPLANT
GAUZE SPONGE 4X4 12PLY STRL (GAUZE/BANDAGES/DRESSINGS) ×4 IMPLANT
GLOVE BIOGEL PI IND STRL 7.0 (GLOVE) ×2 IMPLANT
GLOVE BIOGEL PI INDICATOR 7.0 (GLOVE) ×2
GLOVE SURG SYN 6.5 ES PF (GLOVE) ×4 IMPLANT
GOWN STRL REUS W/ TWL LRG LVL3 (GOWN DISPOSABLE) ×4 IMPLANT
GOWN STRL REUS W/TWL LRG LVL3 (GOWN DISPOSABLE) ×16 IMPLANT
HANDLE SUCTION POOLE (INSTRUMENTS) ×2 IMPLANT
KIT TURNOVER KIT A (KITS) ×4 IMPLANT
LABEL OR SOLS (LABEL) ×4 IMPLANT
NEEDLE HYPO 22GX1.5 SAFETY (NEEDLE) ×4 IMPLANT
NS IRRIG 1000ML POUR BTL (IV SOLUTION) ×4 IMPLANT
NS IRRIG 500ML POUR BTL (IV SOLUTION) ×4 IMPLANT
PACK BASIN MAJOR ARMC (MISCELLANEOUS) ×4 IMPLANT
PACK BASIN MINOR ARMC (MISCELLANEOUS) ×4 IMPLANT
PACK COLON CLEAN CLOSURE (MISCELLANEOUS) ×4 IMPLANT
RELOAD LINEAR CUT PROX 55 BLUE (ENDOMECHANICALS) IMPLANT
RELOAD STAPLER LINEAR PROX 30 (STAPLE) IMPLANT
SPONGE LAP 18X18 RF (DISPOSABLE) ×4 IMPLANT
STAPLER PROXIMATE 55 BLUE (STAPLE) IMPLANT
STAPLER RELOAD LINEAR PROX 30 (STAPLE)
SUCTION POOLE HANDLE (INSTRUMENTS) ×4
SUT CHROMIC 0 SH (SUTURE) IMPLANT
SUT CHROMIC 2 0 SH (SUTURE) IMPLANT
SUT CHROMIC 3 0 SH 27 (SUTURE) IMPLANT
SUT ETHIBOND NAB MO 7 #0 18IN (SUTURE) ×4 IMPLANT
SUT MNCRL 4-0 (SUTURE) ×2
SUT MNCRL 4-0 27XMFL (SUTURE) ×2
SUT PDS AB 1 TP1 54 (SUTURE) ×8 IMPLANT
SUT PROLENE 3 0 PS 2 (SUTURE) IMPLANT
SUT SILK 0 FSL (SUTURE) IMPLANT
SUT SILK 2 0 (SUTURE) ×2
SUT SILK 2 0 REEL (SUTURE) IMPLANT
SUT SILK 2-0 18XBRD TIE 12 (SUTURE) ×2 IMPLANT
SUT SILK 3 0 (SUTURE) ×2
SUT SILK 3-0 18XBRD TIE 12 (SUTURE) ×2 IMPLANT
SUT VIC AB 0 CT1 18XCR BRD 8 (SUTURE) IMPLANT
SUT VIC AB 0 CT1 8-18 (SUTURE)
SUT VIC AB 3-0 SH 27 (SUTURE) ×4
SUT VIC AB 3-0 SH 27X BRD (SUTURE) ×4 IMPLANT
SUTURE MNCRL 4-0 27XMF (SUTURE) ×2 IMPLANT
SYR 10ML LL (SYRINGE) ×8 IMPLANT
TOWEL OR 17X26 4PK STRL BLUE (TOWEL DISPOSABLE) ×4 IMPLANT
TRAY FOLEY MTR SLVR 16FR STAT (SET/KITS/TRAYS/PACK) ×4 IMPLANT
WATER STERILE IRR 1000ML POUR (IV SOLUTION) ×4 IMPLANT

## 2019-09-10 NOTE — H&P (Addendum)
Subjective:   CC: ventral hernia  HPI:  Samantha [name] is a 53 y.o. female who was consulted by Dolores FrameSung for issue above.  Symptoms were first noted several hours ago. Pain is sharp, confined to the epigastric area over hernia, without radiation.  Associated with nothing specific, exacerbated by nothing specific.  Patient has had this hernia for some time now.  Due to her COPD status and active smoking along with her obesity, she was told before that her ventral hernia repair will likely not last long, therefore has not proceeded with a elective repair.   Past Medical History:  has a past medical history of Acute on chronic respiratory failure with hypoxemia (HCC) (08/30/2015), Asthma, COPD (chronic obstructive pulmonary disease) (HCC), Diabetes mellitus without complication (HCC), and Hypertension.  Past Surgical History:  Past Surgical History:  Procedure Laterality Date  . none      Family History: family history includes Asthma in her mother and sister; Diabetes in her mother; Heart attack in her father; Hypertension in her mother.  Social History:  reports that she has been smoking cigarettes. She has a 2.30 pack-year smoking history. She has never used smokeless tobacco. She reports current alcohol use. She reports current drug use. Drug: Cocaine.  Current Medications:  albuterol (PROVENTIL HFA;VENTOLIN HFA) 108 (90 Base) MCG/ACT inhaler Inhale 2 puffs into the lungs every 6 (six) hours as needed for wheezing or shortness of breath. Shaune Pollackhen, Qing, MD Needs Review  ipratropium-albuterol (DUONEB) 0.5-2.5 (3) MG/3ML SOLN Take 3 mLs by nebulization every 6 (six) hours as needed (shortness of breath). [provider] Needs Review  budesonide-formoterol (SYMBICORT) 160-4.5 MCG/ACT inhaler Inhale 2 puffs into the lungs 2 (two) times daily. Governor RooksLord, Rebecca, MD Needs Review  gabapentin (NEURONTIN) 300 MG capsule Take 300 mg by mouth 2 (two) times daily. [provider] Needs Review   glimepiride (AMARYL) 4 MG tablet Take 4 mg by mouth daily. [provider] Needs Review  losartan-hydrochlorothiazide (HYZAAR) 50-12.5 MG tablet Take 1 tablet by mouth daily. [provider] Needs Review  metFORMIN (GLUCOPHAGE) 850 MG tablet Take 850 mg by mouth 2 (two) times daily with a meal.  [provider] Needs Review  montelukast (SINGULAIR) 10 MG tablet Take 10 mg by mouth at bedtime.  [provider] Needs Review  omeprazole (PRILOSEC) 20 MG capsule Take 1 capsule by mouth daily. [provider] Needs Review  predniSONE (DELTASONE) 50 MG tablet Take 1 tablet (50 mg total) by mouth daily with breakfast. Jene EveryKinner, Robert, MD Needs Review  tiotropium (SPIRIVA) 18 MCG inhalation capsule Place 18 mcg into inhaler and inhale daily. [provider] Needs Review     Allergies:  Allergies as of 09/09/2019 - Review Complete 09/09/2019  Allergen Reaction Noted  . Percocet [oxycodone-acetaminophen] Itching 06/30/2015    ROS:  General: Denies weight loss, weight gain, fatigue, fevers, chills, and night sweats. Eyes: Denies blurry vision, double vision, eye pain, itchy eyes, and tearing. Ears: Denies hearing loss, earache, and ringing in ears. Nose: Denies sinus pain, congestion, infections, runny nose, and nosebleeds. Mouth/throat: Denies hoarseness, sore throat, bleeding gums, and difficulty swallowing. Heart: Denies chest pain, palpitations, racing heart, irregular heartbeat, leg pain or swelling, and decreased activity tolerance. Respiratory: Denies breathing difficulty, shortness of breath, wheezing, cough, and sputum. GI: Denies change in appetite, heartburn, nausea, vomiting, constipation, diarrhea, and blood in stool. GU: Denies difficulty urinating, pain with urinating, urgency, frequency, blood in urine. Musculoskeletal: Denies joint stiffness, pain, swelling, muscle weakness. Skin: Denies  rash, itching, mass, tumors, sores, and  boils Neurologic: Denies headache, fainting, dizziness, seizures, numbness, and tingling. Psychiatric: Denies depression, anxiety, difficulty sleeping, and memory loss. Endocrine: Denies heat or cold intolerance, and increased thirst or urination. Blood/lymph: Denies easy bruising, easy bruising, and swollen glands     Objective:     BP (!) 161/84 (BP Location: Left Arm)   Pulse 84   Temp 98.9 F (37.2 C) (Oral)   Resp 18   SpO2 96%   Constitutional :  alert, cooperative, appears stated age and no distress  Lymphatics/Throat:  no asymmetry, masses, or scars  Respiratory:  clear to auscultation bilaterally  Cardiovascular:  regular rate and rhythm  Gastrointestinal: soft, no guarding, incarcerated ventral hernia, tender to touch, not reducible at bedside.   Musculoskeletal: Steady movement  Skin: Cool and moist, no overlying skin change on ventral hernia   Psychiatric: Normal affect, non-agitated, not confused       LABS:  CMP Latest Ref Rng & Units 09/09/2019 05/13/2019 12/03/2018  Glucose 70 - 99 mg/dL 114(H) 122(H) 178(H)  BUN 6 - 20 mg/dL 20 20 50(H)  Creatinine 0.44 - 1.00 mg/dL 1.62(H) 1.50(H) 1.01(H)  Sodium 135 - 145 mmol/L 139 139 142  Potassium 3.5 - 5.1 mmol/L 3.8 4.3 3.9  Chloride 98 - 111 mmol/L 104 109 98  CO2 22 - 32 mmol/L 25 21(L) 36(H)  Calcium 8.9 - 10.3 mg/dL 9.3 9.2 8.8(L)  Total Protein 6.5 - 8.1 g/dL 7.2 7.3 -  Total Bilirubin 0.3 - 1.2 mg/dL 0.7 0.5 -  Alkaline Phos 38 - 126 U/L 77 79 -  AST 15 - 41 U/L 20 17 -  ALT 0 - 44 U/L 25 26 -   CBC Latest Ref Rng & Units 09/09/2019 05/13/2019 12/01/2018  WBC 4.0 - 10.5 K/uL 12.7(H) 9.5 8.8  Hemoglobin 12.0 - 15.0 g/dL 14.4 14.5 12.4  Hematocrit 36.0 - 46.0 % 44.7 45.6 40.5  Platelets 150 - 400 K/uL 376 363 255    RADS: CLINICAL DATA:  Incarcerated hernia  EXAM: CT ABDOMEN AND PELVIS WITH CONTRAST  TECHNIQUE: Multidetector CT imaging of the abdomen and pelvis was performed using the standard  protocol following bolus administration of intravenous contrast.  CONTRAST:  135mL OMNIPAQUE IOHEXOL 300 MG/ML  SOLN  COMPARISON:  None.  FINDINGS: Lower chest: Linear atelectasis in both lower lobes, left greater than right. No pleural fluid.  Hepatobiliary: Mild hepatic steatosis without focal lesion. Clips in the gallbladder fossa postcholecystectomy. No biliary dilatation.  Pancreas: No ductal dilatation or inflammation.  Spleen: Normal in size without focal abnormality.  Adrenals/Urinary Tract: Nonspecific 10 mm left adrenal nodule. Normal right adrenal gland. Mild left renal scarring. No hydronephrosis or perinephric edema. Small cyst in the upper right kidney. Symmetric excretion on delayed phase imaging. Urinary bladder is unremarkable.  Stomach/Bowel: Supraumbilical ventral abdominal wall hernia contains short segment of transverse colon. There is adjacent free fluid and stranding within the hernia sac, with wall thickening of the colonic segments within the hernia neck. The hernia neck measures 4.2 x 3.8 cm in transverse by AP dimension. Moderate volume of stool throughout the colon both proximal and distal to the umbilical hernia. No small bowel dilatation, inflammation, or obstruction. Stomach is nondistended. Normal appendix.  Vascular/Lymphatic: Moderate aorto bi-iliac atherosclerosis. No aneurysm. No mesenteric or portal venous gas. No enlarged lymph nodes in the abdomen or pelvis.  Reproductive: Uterus and bilateral adnexa are unremarkable.  Other: Supraumbilical ventral abdominal wall hernia containing short segment of  transverse colon as described above. There is also a small fat containing umbilical hernia. And upper abdominal ventral abdominal wall hernia contains minimal free fluid without inflammatory tori change. There is mesenteric edema at the supraumbilical hernia. No ascites in the abdomen or pelvis. No free air or  perforation.  Musculoskeletal: Avascular necrosis of both femoral heads. Transitional lumbosacral anatomy. There are no acute or suspicious osseous abnormalities.  IMPRESSION: 1. Supraumbilical ventral abdominal wall hernia contains transverse colon with associated inflammatory change suspicious for incarceration. There is inflammatory changes in free fluid in the hernia sac as well as mesenteric edema. No perforation or bowel obstruction. 2. Small additional fat containing umbilical and upper abdominal wall hernias. 3. Mild hepatic steatosis. 4. Avascular necrosis of both femoral heads. 5. Nonspecific 10 mm left adrenal nodule, likely an adenoma but no prior exams to available for comparison.  Aortic Atherosclerosis (ICD10-I70.0).   Electronically Signed   By: Narda Rutherford M.D.   On: 09/10/2019 02:13 Assessment:   Incarcerated, possible strangulated ventral hernia with colon Hx of cocaine use, last use three days ago  Plan:   Discussed  Surgery, since no other options available.  The risk of surgery include, but not limited to, recurrence, bleeding, chronic pain, post-op infxn, post-op SBO or ileus, resection of bowel, re-anastamosis, possible ostomy placement and need for re-operation to address said risks. The risks of general anesthetic, if used, includes MI, CVA, sudden death or even reaction to anesthetic medications also discussed. Alternatives include none.  Benefits include symptom relief, preventing further decline in health and possible death.  Typical post-op recovery time of additional days in hospital for observation afterwards also discussed.  The patient verbalized understanding and all questions were answered to the patient's satisfaction.  She is at a high risk secondary to her advanced COPD, obesity, DM, current smoking, and recent cocaine use.  After discussing possibility of fatal anesthesia complications from her drug use, patient at this time is  stating she will like to postpone any surgical intervention if her urine drug screen still comes back as positive.  I explained to her that delaying any surgical care will continue to place the incarcerated hernia contents at risk for ischemia, if not already, and that can continue to make her sick and even cause her demise.  She verbalized understanding and stated that at this time she still wishes to postpone any surgery if the urine drug screen comes back positive.  I will ask the anesthesiologist to have a discussion with her to compare the risk of anesthesia complication secondary to recent cocaine use versus risks involved with postponing surgery.  We will then decide on final course of action as a group.  Patient also stated that her next of kin is her aunt, Vista Mink listed in the system.  She did specifically verbalized that she will like to maintain a FULL CODE STATUS.  Consult call received at 0221.  Pt seen by 0245.

## 2019-09-10 NOTE — Progress Notes (Signed)
Georgi A Stelzner to be D/C'd home per MD order.  Discussed prescriptions and follow up appointments with the patient. Prescriptions given to patient, medication list explained in detail. Pt verbalized understanding.  Allergies as of 09/10/2019      Reactions   Percocet [oxycodone-acetaminophen] Itching      Medication List    STOP taking these medications   predniSONE 50 MG tablet Commonly known as: DELTASONE     TAKE these medications   acetaminophen 325 MG tablet Commonly known as: Tylenol Take 2 tablets (650 mg total) by mouth every 8 (eight) hours as needed for mild pain.   albuterol 108 (90 Base) MCG/ACT inhaler Commonly known as: VENTOLIN HFA Inhale 2 puffs into the lungs every 6 (six) hours as needed for wheezing or shortness of breath.   budesonide-formoterol 160-4.5 MCG/ACT inhaler Commonly known as: Symbicort Inhale 2 puffs into the lungs 2 (two) times daily.   gabapentin 300 MG capsule Commonly known as: NEURONTIN Take 300 mg by mouth 3 (three) times daily.   ipratropium-albuterol 0.5-2.5 (3) MG/3ML Soln Commonly known as: DUONEB Take 3 mLs by nebulization every 6 (six) hours as needed (shortness of breath).   losartan-hydrochlorothiazide 50-12.5 MG tablet Commonly known as: HYZAAR Take 1 tablet by mouth daily.   metFORMIN 850 MG tablet Commonly known as: GLUCOPHAGE Take 850 mg by mouth daily.   montelukast 10 MG tablet Commonly known as: SINGULAIR Take 10 mg by mouth at bedtime.   omeprazole 20 MG capsule Commonly known as: PRILOSEC Take 20 mg by mouth daily.   tiotropium 18 MCG inhalation capsule Commonly known as: SPIRIVA Place 18 mcg into inhaler and inhale daily.       Vitals:   09/10/19 0502 09/10/19 1224  BP: (!) 145/80 (!) 160/95  Pulse: 80 82  Resp: 20 19  Temp: 97.8 F (36.6 C) 97.8 F (36.6 C)  SpO2: 97% 93%    Skin clean, dry and intact without evidence of skin break down, no evidence of skin tears noted. IV catheter  discontinued intact. Site without signs and symptoms of complications. Dressing and pressure applied. Pt denies pain at this time. No complaints noted.  An After Visit Summary was printed and given to the patient. Patient escorted via [place], and D/C home via private auto.  Chuck Hint RN [facility] [employer]

## 2019-09-10 NOTE — OR Nursing (Signed)
Surgery cancelled d/t MD being able to reduce hernia. +cocaine uds. Anesthesia notified.

## 2019-09-10 NOTE — Progress Notes (Signed)
Pt resting in bed without distress awaiting bed assignment on floor.

## 2019-09-10 NOTE — Discharge Summary (Signed)
Physician Discharge Summary  Patient ID: Samantha [name] MRN: [id number] DOB/AGE: [birth year] 53 y.o.  Admit date: 09/09/2019 Discharge date: 09/10/2019  Admission Diagnoses: Incarcerated ventral hernia  Discharge Diagnoses:  Same as above  Discharged Condition: good  Hospital Course: Patient admitted under observation for above.  Presented to the ED where initial attempt at reduction was unsuccessful, therefore patient was getting prepped for emergent surgery.  However, she had a positive cocaine noted on her UDS.  Therefore an additional attempt was made at reduction right before the operating room.  The colon now is incarcerated within the hernia seen on CT was successfully reduced at this time.  Due to the prolonged time of incarceration and significant pain upon presentation, patient was placed in observation to ensure no signs of bowel ischemia.  During the observation period, patient had minimal pain, labs all stayed within normal limits, and she was able to tolerate a regular diet prior to discharge.  Due to the incidence of incarceration, I recommended that we refer her back to her pulmonologist to see if there was any additional measures that can be taken to optimize her chronic and severe COPD status, in hopes to proceed with hernia repair to prevent a another episode of incarceration with decrease perioperative risk.  Consults: None  Discharge Exam: Blood pressure (!) 160/95, pulse 82, temperature 97.8 F (36.6 C), temperature source Oral, resp. rate 19, height 5\' 7"  (1.702 m), weight 129.3 kg, SpO2 93 %. General appearance: alert, cooperative and no distress GI: soft, non-tender; bowel sounds normal; no masses,  no organomegaly and Ventral hernia remains easily reducible by laying supine.  It does recur as soon as you sits up into a sitting position but she no longer complains of the intense pain she had upon presentation  Disposition:  Discharge disposition: 01-Home or Self  Care       Discharge Instructions    Discharge patient   Complete by: As directed    Discharge disposition: 01-Home or Self Care   Discharge patient date: 09/10/2019     Allergies as of 09/10/2019      Reactions   Percocet [oxycodone-acetaminophen] Itching      Medication List    STOP taking these medications   predniSONE 50 MG tablet Commonly known as: DELTASONE     TAKE these medications   acetaminophen 325 MG tablet Commonly known as: Tylenol Take 2 tablets (650 mg total) by mouth every 8 (eight) hours as needed for mild pain.   albuterol 108 (90 Base) MCG/ACT inhaler Commonly known as: VENTOLIN HFA Inhale 2 puffs into the lungs every 6 (six) hours as needed for wheezing or shortness of breath.   budesonide-formoterol 160-4.5 MCG/ACT inhaler Commonly known as: Symbicort Inhale 2 puffs into the lungs 2 (two) times daily.   gabapentin 300 MG capsule Commonly known as: NEURONTIN Take 300 mg by mouth 3 (three) times daily.   ipratropium-albuterol 0.5-2.5 (3) MG/3ML Soln Commonly known as: DUONEB Take 3 mLs by nebulization every 6 (six) hours as needed (shortness of breath).   losartan-hydrochlorothiazide 50-12.5 MG tablet Commonly known as: HYZAAR Take 1 tablet by mouth daily.   metFORMIN 850 MG tablet Commonly known as: GLUCOPHAGE Take 850 mg by mouth daily.   montelukast 10 MG tablet Commonly known as: SINGULAIR Take 10 mg by mouth at bedtime.   omeprazole 20 MG capsule Commonly known as: PRILOSEC Take 20 mg by mouth daily.   tiotropium 18 MCG inhalation capsule Commonly known as: SPIRIVA Place 18  mcg into inhaler and inhale daily.      Follow-up Information    Erby Pian, MD. Schedule an appointment as soon as possible for a visit.   Specialty: Specialist Why: f/u for recent hospitalization and medical optimization for possible upcoming hernia repair Contact information: [place] [address] [phone]         Benjamine Sprague, DO. Schedule an appointment as soon as possible for a visit.   Specialty: Surgery Why: after seeing Dr. Raul Del and he is able provide risk stratification for possible hernia surgery Contact information: [place] [address] [phone]            Total time spent arranging discharge was >52min. Signed: Benjamine Sprague 09/10/2019, 2:11 PM

## 2019-09-10 NOTE — Progress Notes (Signed)
Report to Kelly, RN.

## 2019-09-10 NOTE — Progress Notes (Signed)
Pt in PACU awaiting surgery. Dr. Lysle Pearl at bedside and was able to reduce hernia. Surgery cancelled. Pt to be admitted to hospital.

## 2019-09-10 NOTE — Care Management Obs Status (Signed)
[place] NOTIFICATION   Patient Details  Name: Samantha [name] MRN: [id number] Date of Birth: November 29, 1966   Medicare Observation Status Notification Given:  No(admitted observation less thand 24 hours)    Beverly Sessions, RN 09/10/2019, 2:37 PM

## 2019-09-10 NOTE — Progress Notes (Signed)
Pt transported to room on floor. Abd remains soft after hernia reduced by Dr. Lysle Pearl. Pt resting in bed without distress.

## 2019-09-10 NOTE — Progress Notes (Signed)
Subjective:  CC: Samantha [name] is a 53 y.o. female  Hospital stay day 0,  Incarcerated ventral hernia  HPI: Pt UDS came back positive.    ROS:  General: Denies weight loss, weight gain, fatigue, fevers, chills, and night sweats. Heart: Denies chest pain, palpitations, racing heart, irregular heartbeat, leg pain or swelling, and decreased activity tolerance. Respiratory: Denies breathing difficulty, shortness of breath, wheezing, cough, and sputum. GI: Denies change in appetite, heartburn, nausea, vomiting, constipation, diarrhea, and blood in stool. GU: Denies difficulty urinating, pain with urinating, urgency, frequency, blood in urine.   Objective:   Temp:  [98.9 F (37.2 C)] 98.9 F (37.2 C) (09/28 2022) Pulse Rate:  [84-105] 84 (09/29 0240) Resp:  [18-22] 18 (09/29 0240) BP: (120-161)/(84-100) 161/84 (09/29 0240) SpO2:  [95 %-97 %] 96 % (09/29 0240)             Intake/Output this shift:   Intake/Output Summary (Last 24 hours) at 09/10/2019 0410 Last data filed at 09/10/2019 0115 Gross per 24 hour  Intake 1000 ml  Output -  Net 1000 ml    Constitutional :  alert, cooperative, appears stated age and no distress  Respiratory:  clear to auscultation bilaterally  Cardiovascular:  regular rate and rhythm  Gastrointestinal: soft, no guarding, ventral hernia still present, but second attempt at reduction at bedside successful.  no complaint of TTP and instant relief of pain noted after reduction.  .   Skin: Cool and moist.   Psychiatric: Normal affect, non-agitated, not confused       LABS:  CMP Latest Ref Rng & Units 09/09/2019 05/13/2019 12/03/2018  Glucose 70 - 99 mg/dL 114(H) 122(H) 178(H)  BUN 6 - 20 mg/dL 20 20 50(H)  Creatinine 0.44 - 1.00 mg/dL 1.62(H) 1.50(H) 1.01(H)  Sodium 135 - 145 mmol/L 139 139 142  Potassium 3.5 - 5.1 mmol/L 3.8 4.3 3.9  Chloride 98 - 111 mmol/L 104 109 98  CO2 22 - 32 mmol/L 25 21(L) 36(H)  Calcium 8.9 - 10.3 mg/dL 9.3 9.2 8.8(L)  Total  Protein 6.5 - 8.1 g/dL 7.2 7.3 -  Total Bilirubin 0.3 - 1.2 mg/dL 0.7 0.5 -  Alkaline Phos 38 - 126 U/L 77 79 -  AST 15 - 41 U/L 20 17 -  ALT 0 - 44 U/L 25 26 -   CBC Latest Ref Rng & Units 09/09/2019 05/13/2019 12/01/2018  WBC 4.0 - 10.5 K/uL 12.7(H) 9.5 8.8  Hemoglobin 12.0 - 15.0 g/dL 14.4 14.5 12.4  Hematocrit 36.0 - 46.0 % 44.7 45.6 40.5  Platelets 150 - 400 K/uL 376 363 255    RADS: n/a Assessment:   Incarcerated ventral hernia.  UDS came back positive cocaine, so to avoid emergent surgery, second attempt at bedside reduction performed.  This time successful at 0405.  Instant pain relief noted, easily palpable hernia defect.  Pt has no complaints at this time.  Due to prolonged time of incarceration and worsening pain, will monitor on floor to ensure no signs of ischemic bowel with serial abdominal exams and repeat lactate levels.  Will keep her NPO just in case she requires any surgical intervention in a few hours.    Time spent on this encounter >67min

## 2019-09-10 NOTE — Anesthesia Preprocedure Evaluation (Deleted)
Anesthesia Evaluation  Patient identified by MRN, date of birth, ID band Patient awake    Reviewed: Allergy & Precautions, H&P , NPO status , Patient's Chart, lab work & pertinent test results  Airway Mallampati: III  TM Distance: >3 FB Neck ROM: full    Dental   Pulmonary COPD (2L/min while sleeping),  COPD inhaler and oxygen dependent, Current Smoker,           Cardiovascular hypertension, (-) angina(-) Past MI and (-) Cardiac Stents (-) dysrhythmias      Neuro/Psych negative neurological ROS  negative psych ROS   GI/Hepatic negative GI ROS, (+)     substance abuse  cocaine use,   Endo/Other  diabetes, Type 2, Oral Hypoglycemic Agents  Renal/GU      Musculoskeletal   Abdominal   Peds  Hematology negative hematology ROS (+)   Anesthesia Other Findings Past Medical History: 08/30/2015: Acute on chronic respiratory failure with hypoxemia (HCC) No date: Asthma No date: COPD (chronic obstructive pulmonary disease) (HCC) No date: Diabetes mellitus without complication (HCC) No date: Hypertension  Past Surgical History: No date: none     Reproductive/Obstetrics negative OB ROS                             Anesthesia Physical Anesthesia Plan  ASA: III and emergent  Anesthesia Plan: General ETT and Rapid Sequence   Post-op Pain Management:    Induction:   PONV Risk Score and Plan: Ondansetron, Dexamethasone, Midazolam and Treatment may vary due to age or medical condition  Airway Management Planned:   Additional Equipment:   Intra-op Plan:   Post-operative Plan:   Informed Consent: I have reviewed the patients History and Physical, chart, labs and discussed the procedure including the risks, benefits and alternatives for the proposed anesthesia with the patient or authorized representative who has indicated his/her understanding and acceptance.     Dental Advisory  Given  Plan Discussed with: Anesthesiologist, CRNA and Surgeon  Anesthesia Plan Comments: (Cocaine positive.  Discussed risks with pt and surgeon associated with cocaine and anesthesia.    ADDENDUM: Dr. Lysle Pearl originally declared this case an emergency, but he was actually able to manually reduce the hernia in pre-op after several previous failed attempts in the ED.  The pt's pain immediately resolved and surgery was canceled at this time.)        Anesthesia Quick Evaluation

## 2019-09-11 DIAGNOSIS — J449 Chronic obstructive pulmonary disease, unspecified: Secondary | ICD-10-CM | POA: Diagnosis not present

## 2019-09-11 LAB — HIV ANTIBODY (ROUTINE TESTING W REFLEX): HIV Screen 4th Generation wRfx: NONREACTIVE — AB

## 2019-09-13 DIAGNOSIS — J449 Chronic obstructive pulmonary disease, unspecified: Secondary | ICD-10-CM | POA: Diagnosis not present

## 2019-09-16 DIAGNOSIS — J9621 Acute and chronic respiratory failure with hypoxia: Secondary | ICD-10-CM | POA: Diagnosis not present

## 2019-09-16 DIAGNOSIS — J449 Chronic obstructive pulmonary disease, unspecified: Secondary | ICD-10-CM | POA: Diagnosis not present

## 2019-09-16 DIAGNOSIS — J9622 Acute and chronic respiratory failure with hypercapnia: Secondary | ICD-10-CM | POA: Diagnosis not present

## 2019-10-05 DIAGNOSIS — J9601 Acute respiratory failure with hypoxia: Secondary | ICD-10-CM | POA: Diagnosis not present

## 2019-10-05 DIAGNOSIS — J441 Chronic obstructive pulmonary disease with (acute) exacerbation: Secondary | ICD-10-CM | POA: Diagnosis not present

## 2019-10-11 DIAGNOSIS — J449 Chronic obstructive pulmonary disease, unspecified: Secondary | ICD-10-CM | POA: Diagnosis not present

## 2019-10-14 DIAGNOSIS — J449 Chronic obstructive pulmonary disease, unspecified: Secondary | ICD-10-CM | POA: Diagnosis not present

## 2019-10-17 DIAGNOSIS — J9622 Acute and chronic respiratory failure with hypercapnia: Secondary | ICD-10-CM | POA: Diagnosis not present

## 2019-10-17 DIAGNOSIS — J449 Chronic obstructive pulmonary disease, unspecified: Secondary | ICD-10-CM | POA: Diagnosis not present

## 2019-10-17 DIAGNOSIS — J9621 Acute and chronic respiratory failure with hypoxia: Secondary | ICD-10-CM | POA: Diagnosis not present

## 2019-10-27 IMAGING — CR DG CHEST 2V
1 series · 2 of 2 positions shown · non-contrast
Comparison: 02/21/2018 and 12/19/2017.

CLINICAL DATA: Cough with shortness of breath. History of COPD.
Evaluate for pneumonia.

EXAM:
CHEST - 2 VIEW

[Series 1: dg chest 2 view · 0.14mm/px · 2 of 2 slices shown]
[im 1/2]
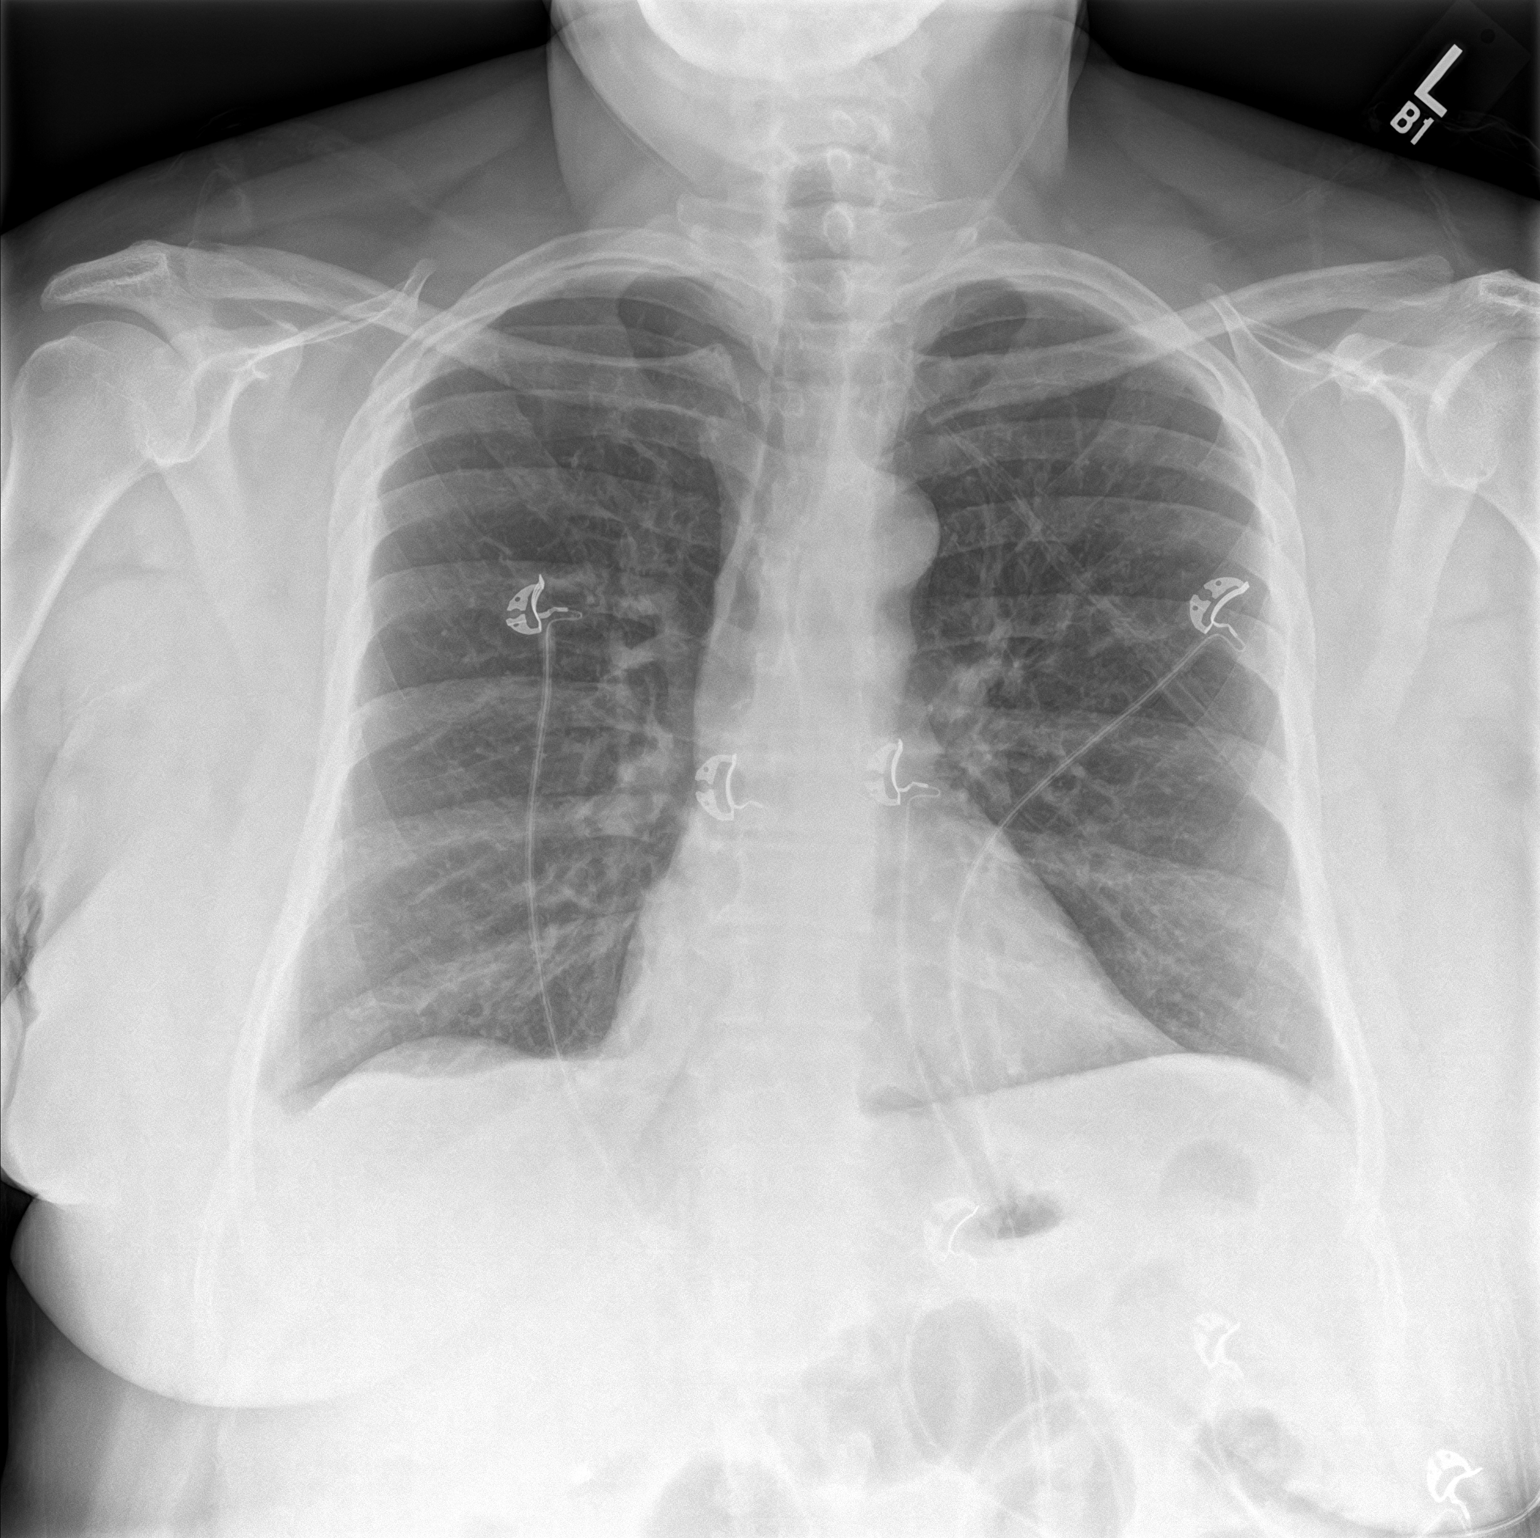
[im 2/2]
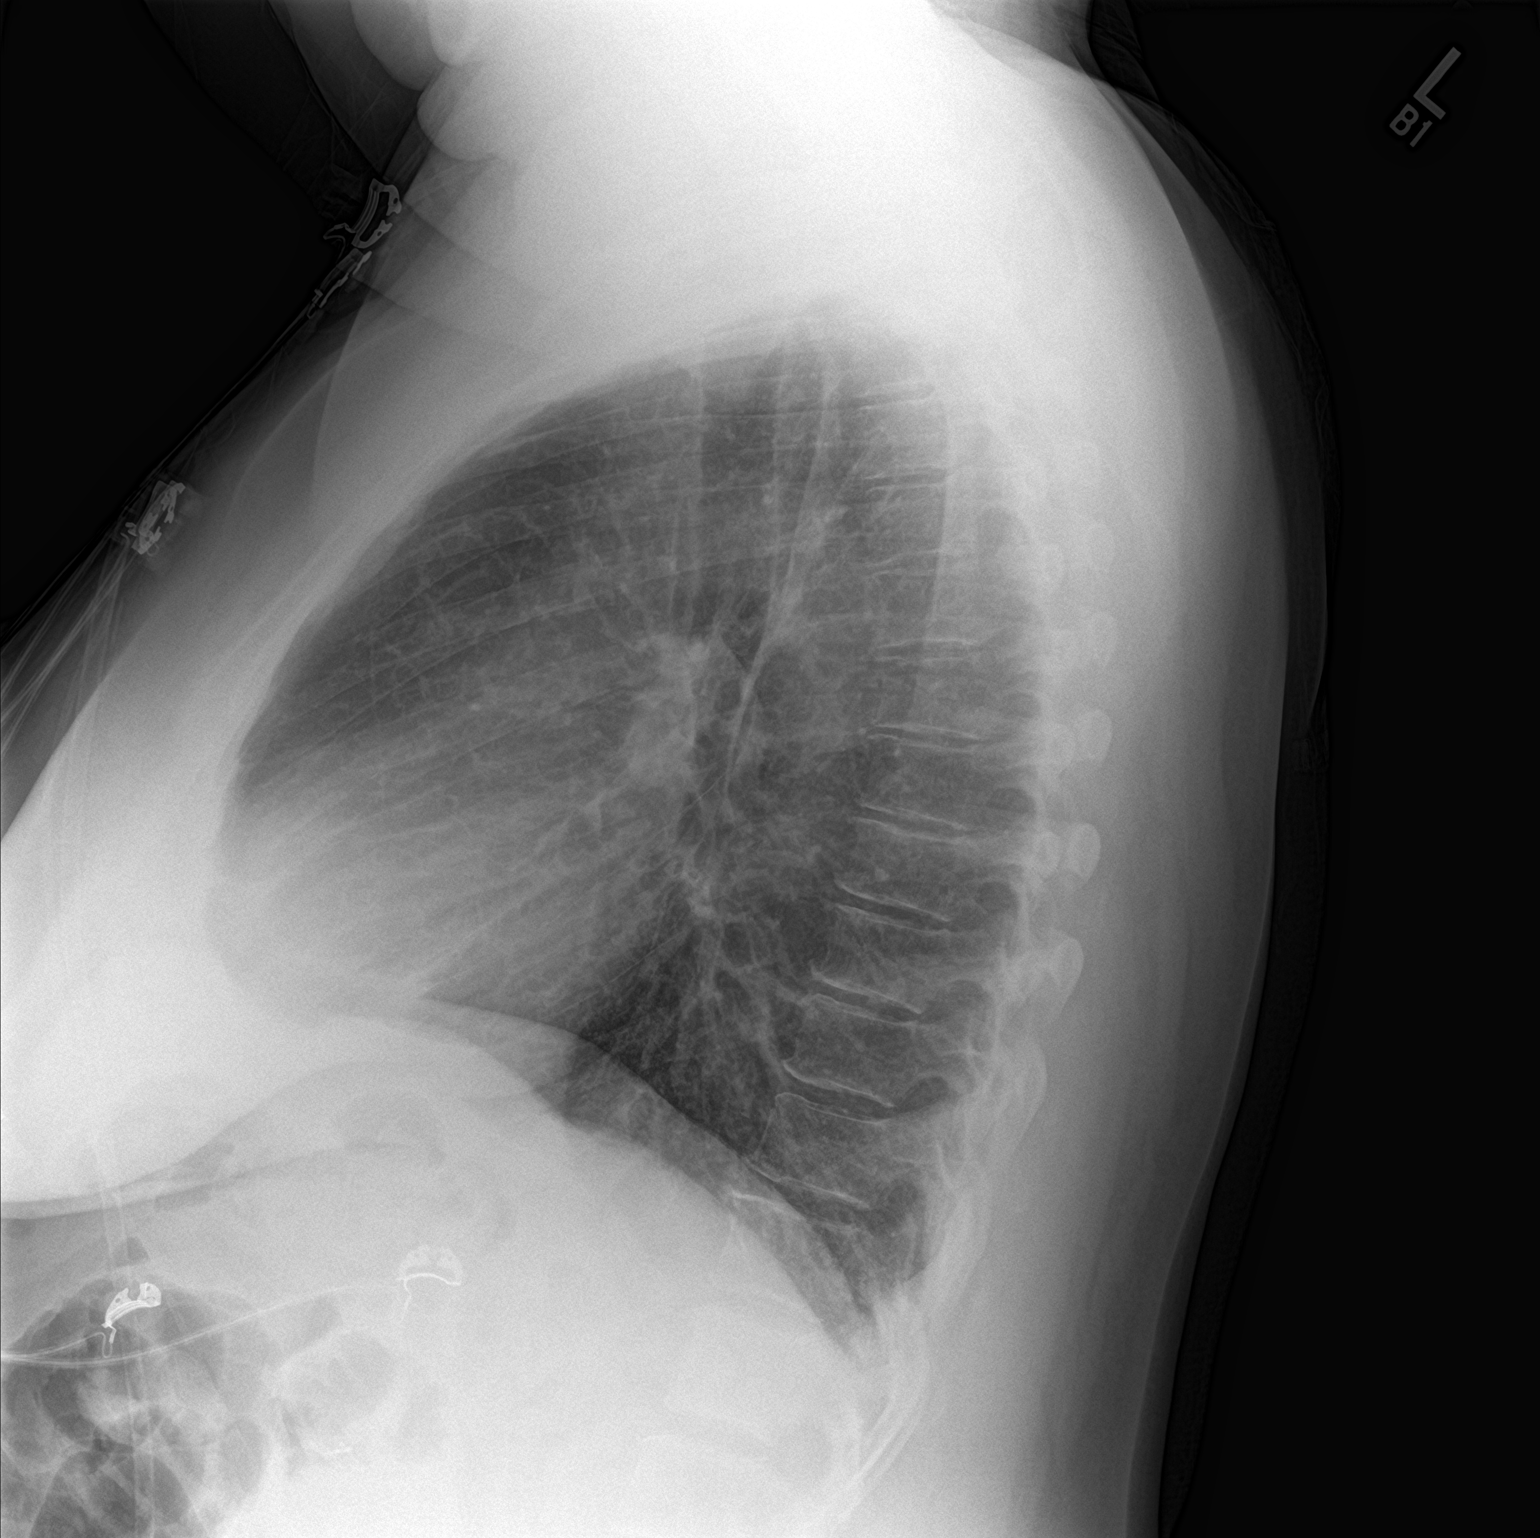

[2 of 2 positions shown; findings below may reference images not displayed]

FINDINGS: The heart size and mediastinal contours are stable. The lungs are
mildly hyperinflated but clear. There is no significant pleural
effusion or pneumothorax. No acute osseous findings are seen.
Cholecystectomy clips are noted.
IMPRESSION: No acute cardiopulmonary process. Probable chronic obstructive
pulmonary disease.

## 2019-11-05 DIAGNOSIS — J441 Chronic obstructive pulmonary disease with (acute) exacerbation: Secondary | ICD-10-CM | POA: Diagnosis not present

## 2019-11-05 DIAGNOSIS — J9601 Acute respiratory failure with hypoxia: Secondary | ICD-10-CM | POA: Diagnosis not present

## 2019-11-07 IMAGING — DX DG CHEST 1V PORT
1 series · 1 of 1 positions shown · non-contrast
Comparison: 06/27/2018 and prior radiograph

CLINICAL DATA: Acute shortness of breath

EXAM:
PORTABLE CHEST 1 VIEW

[chest ap]
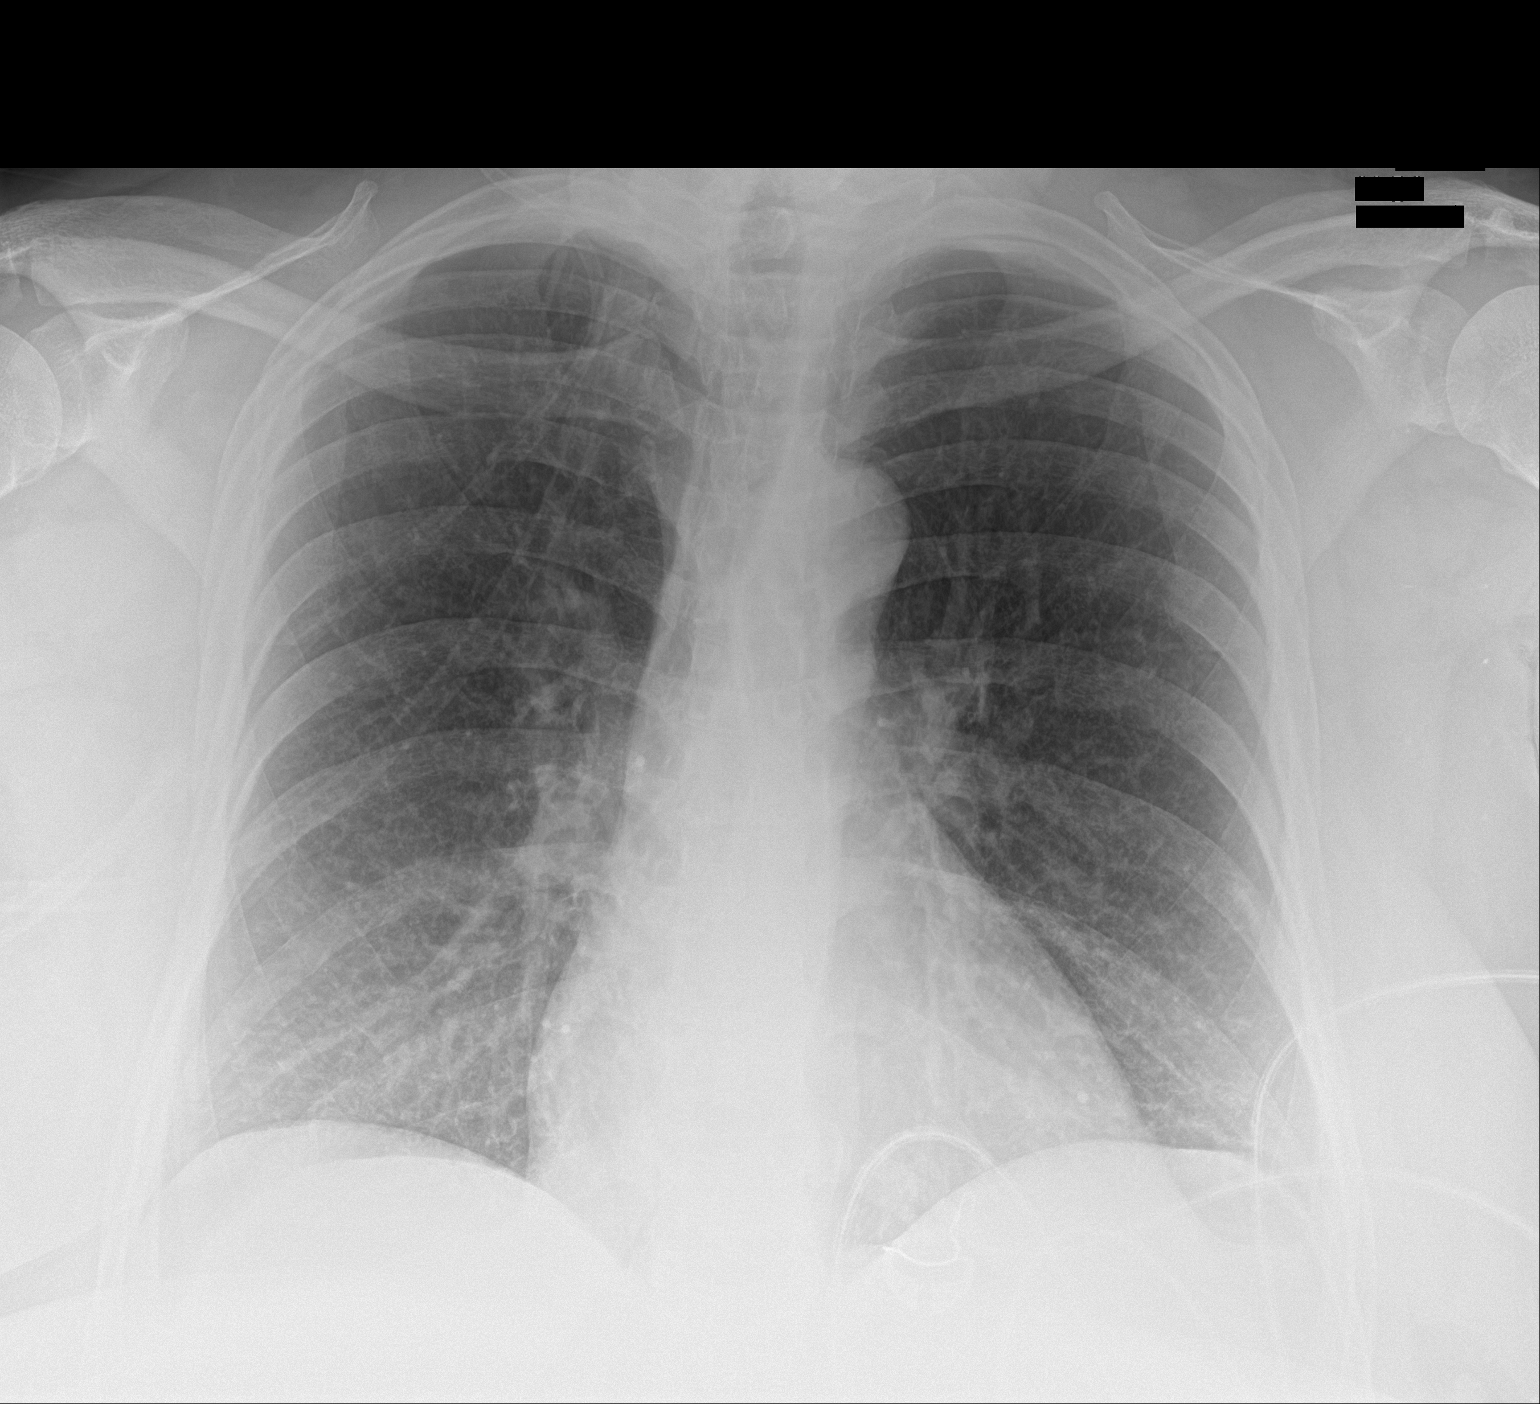

[1 of 1 positions shown; findings below may reference images not displayed]

FINDINGS: UPPER limits normal heart size noted.

Mild peribronchial thickening is unchanged.

There is no evidence of focal airspace disease, pulmonary edema,
suspicious pulmonary nodule/mass, pleural effusion, or pneumothorax.

No acute bony abnormalities are identified.
IMPRESSION: No evidence of acute cardiopulmonary disease.

## 2019-11-11 DIAGNOSIS — J449 Chronic obstructive pulmonary disease, unspecified: Secondary | ICD-10-CM | POA: Diagnosis not present

## 2019-11-13 DIAGNOSIS — J449 Chronic obstructive pulmonary disease, unspecified: Secondary | ICD-10-CM | POA: Diagnosis not present

## 2019-11-16 ENCOUNTER — Emergency Department
Admission: EM | Admit: 2019-11-16 | Discharge: 2019-11-16 | Disposition: A | Discharge status: Home / Self Care | Payer: Medicare HMO | Attending: Emergency Medicine | Admitting: Emergency Medicine

## 2019-11-16 ENCOUNTER — Other Ambulatory Visit: Payer: Self-pay

## 2019-11-16 ENCOUNTER — Encounter: Payer: Self-pay | Admitting: Emergency Medicine

## 2019-11-16 ENCOUNTER — Emergency Department: Payer: Medicare HMO

## 2019-11-16 DIAGNOSIS — R0602 Shortness of breath: Secondary | ICD-10-CM | POA: Diagnosis not present

## 2019-11-16 DIAGNOSIS — J441 Chronic obstructive pulmonary disease with (acute) exacerbation: Secondary | ICD-10-CM

## 2019-11-16 DIAGNOSIS — R062 Wheezing: Secondary | ICD-10-CM | POA: Diagnosis not present

## 2019-11-16 DIAGNOSIS — Z9981 Dependence on supplemental oxygen: Secondary | ICD-10-CM | POA: Insufficient documentation

## 2019-11-16 DIAGNOSIS — R0603 Acute respiratory distress: Secondary | ICD-10-CM | POA: Diagnosis not present

## 2019-11-16 DIAGNOSIS — J9621 Acute and chronic respiratory failure with hypoxia: Secondary | ICD-10-CM | POA: Diagnosis not present

## 2019-11-16 DIAGNOSIS — R609 Edema, unspecified: Secondary | ICD-10-CM | POA: Diagnosis not present

## 2019-11-16 DIAGNOSIS — I10 Essential (primary) hypertension: Secondary | ICD-10-CM | POA: Diagnosis not present

## 2019-11-16 DIAGNOSIS — R069 Unspecified abnormalities of breathing: Secondary | ICD-10-CM | POA: Diagnosis not present

## 2019-11-16 DIAGNOSIS — F17210 Nicotine dependence, cigarettes, uncomplicated: Secondary | ICD-10-CM | POA: Insufficient documentation

## 2019-11-16 DIAGNOSIS — J9622 Acute and chronic respiratory failure with hypercapnia: Secondary | ICD-10-CM | POA: Diagnosis not present

## 2019-11-16 DIAGNOSIS — Z20828 Contact with and (suspected) exposure to other viral communicable diseases: Secondary | ICD-10-CM | POA: Diagnosis not present

## 2019-11-16 DIAGNOSIS — J449 Chronic obstructive pulmonary disease, unspecified: Secondary | ICD-10-CM | POA: Diagnosis not present

## 2019-11-16 LAB — CBC WITH DIFFERENTIAL/PLATELET
Abs Immature Granulocytes: 0.04 10*3/uL (ref 0.00–0.07)
Basophils Absolute: 0.1 10*3/uL (ref 0.0–0.1)
Basophils Relative: 1 %
Eosinophils Absolute: 0.3 10*3/uL (ref 0.0–0.5)
Eosinophils Relative: 3 %
HCT: 39.8 % (ref 36.0–46.0)
Hemoglobin: 13.1 g/dL (ref 12.0–15.0)
Immature Granulocytes: 0 %
Lymphocytes Relative: 33 %
Lymphs Abs: 3.2 10*3/uL (ref 0.7–4.0)
MCH: 28.7 pg (ref 26.0–34.0)
MCHC: 32.9 g/dL (ref 30.0–36.0)
MCV: 87.1 fL (ref 80.0–100.0)
Monocytes Absolute: 0.5 10*3/uL (ref 0.1–1.0)
Monocytes Relative: 6 %
Neutro Abs: 5.5 10*3/uL (ref 1.7–7.7)
Neutrophils Relative %: 57 %
Platelets: 365 10*3/uL (ref 150–400)
RBC: 4.57 MIL/uL (ref 3.87–5.11)
RDW: 14.6 % (ref 11.5–15.5)
WBC: 9.6 10*3/uL (ref 4.0–10.5)
nRBC: 0 % (ref 0.0–0.2)

## 2019-11-16 LAB — BASIC METABOLIC PANEL
Anion gap: 7 (ref 5–15)
BUN: 35 mg/dL — ABNORMAL HIGH (ref 6–20)
CO2: 23 mmol/L (ref 22–32)
Calcium: 8.5 mg/dL — ABNORMAL LOW (ref 8.9–10.3)
Chloride: 109 mmol/L (ref 98–111)
Creatinine, Ser: 1.5 mg/dL — ABNORMAL HIGH (ref 0.44–1.00)
GFR calc Af Amer: 45 mL/min — ABNORMAL LOW (ref >60)
GFR calc non Af Amer: 39 mL/min — ABNORMAL LOW (ref >60)
Glucose, Bld: 148 mg/dL — ABNORMAL HIGH (ref 70–99)
Potassium: 4.1 mmol/L (ref 3.5–5.1)
Sodium: 139 mmol/L (ref 135–145)

## 2019-11-16 LAB — TROPONIN I (HIGH SENSITIVITY): Troponin I (High Sensitivity): 4 ng/L (ref <18)

## 2019-11-16 LAB — MAGNESIUM: Magnesium: 2.1 mg/dL (ref 1.7–2.4)

## 2019-11-16 LAB — PROCALCITONIN: Procalcitonin: <0.1 ng/mL

## 2019-11-16 MED ORDER — ALBUTEROL SULFATE (2.5 MG/3ML) 0.083% IN NEBU: 5.0000 mg | INHALATION_SOLUTION | Freq: Once | RESPIRATORY_TRACT | Status: DC

## 2019-11-16 MED ORDER — DOXYCYCLINE HYCLATE 100 MG PO CAPS: 100.0000 mg | ORAL_CAPSULE | Freq: Two times a day (BID) | ORAL | 0 refills | Status: AC

## 2019-11-16 MED ORDER — SODIUM CHLORIDE 0.9 % IV SOLN: 500.0000 mg | Freq: Once | INTRAVENOUS | Status: DC

## 2019-11-16 MED ORDER — IPRATROPIUM-ALBUTEROL 0.5-2.5 (3) MG/3ML IN SOLN
3.0000 mL | Freq: Once | RESPIRATORY_TRACT | Status: AC
  Administered 2019-11-16: 3 mL via RESPIRATORY_TRACT
  Filled 2019-11-16: qty 3

## 2019-11-16 MED ORDER — PREDNISONE 10 MG PO TABS: ORAL_TABLET | ORAL | 0 refills | Status: AC

## 2019-11-16 MED ORDER — BUDESONIDE-FORMOTEROL FUMARATE 160-4.5 MCG/ACT IN AERO: 2.0000 | INHALATION_SPRAY | Freq: Two times a day (BID) | RESPIRATORY_TRACT | 12 refills | Status: DC

## 2019-11-16 MED ORDER — ALBUTEROL SULFATE HFA 108 (90 BASE) MCG/ACT IN AERS: 2.0000 | INHALATION_SPRAY | RESPIRATORY_TRACT | 0 refills | Status: DC | PRN

## 2019-11-16 MED ORDER — MAGNESIUM SULFATE 2 GM/50ML IV SOLN
2.0000 g | INTRAVENOUS | Status: AC
  Administered 2019-11-16: 2 g via INTRAVENOUS
  Filled 2019-11-16: qty 50

## 2019-11-16 MED ORDER — METHYLPREDNISOLONE SODIUM SUCC 125 MG IJ SOLR
125.0000 mg | Freq: Once | INTRAMUSCULAR | Status: AC
  Administered 2019-11-16: 125 mg via INTRAVENOUS
  Filled 2019-11-16: qty 2

## 2019-11-16 MED ORDER — SODIUM CHLORIDE 0.9 % IV SOLN: 2.0000 g | Freq: Once | INTRAVENOUS | Status: DC

## 2019-11-16 NOTE — ED Provider Notes (Signed)
[facility] Emergency Department Provider Note  ____________________________________________  Time seen: Approximately 5:16 PM  I have reviewed the triage vital signs and the nursing notes.   HISTORY  Chief Complaint Shortness of Breath    HPI Samantha [name] is a 53 y.o. female with a history of COPD on 2 L nasal cannula at home who reports worsening, constant shortness of breath and dyspnea on exertion over the past 3 days.  Denies chest pain or change in cough.  No body aches fevers chills or sick contacts.  Been using her nebulizer and CPAP at home without relief.  EMS found the patient to have a oxygen saturation of 80% on her 2 L nasal cannula with diminished breath sounds so they started CPAP during transport.  On arrival patient is tachypneic with accessory muscle use, clearly in respiratory distress.      Past Medical History:  Diagnosis Date  . COPD (chronic obstructive pulmonary disease) (HCC)      There are no active problems to display for this patient.    History reviewed. No pertinent surgical history.   Prior to Admission medications   Not on File     Allergies Patient has no known allergies.   History reviewed. No pertinent family history.  Social History Social History   Tobacco Use  . Smoking status: Current Every Day Smoker  . Smokeless tobacco: Never Used  Substance Use Topics  . Alcohol use: Not on file  . Drug use: Not on file    Review of Systems  Constitutional:   No fever or chills.  ENT:   No sore throat. No rhinorrhea. Cardiovascular:   No chest pain or syncope. Respiratory: Positive shortness of breath and chronic nonproductive cough. Gastrointestinal:   Negative for abdominal pain, vomiting and diarrhea.  Musculoskeletal:   Negative for focal pain or swelling All other systems reviewed and are negative except as documented above in ROS and  HPI.  ____________________________________________   PHYSICAL EXAM:  VITAL SIGNS: ED Triage Vitals  Enc Vitals Group     BP 11/16/19 1630 (!) 185/91     Pulse Rate 11/16/19 1617 95     Resp 11/16/19 1617 18     Temp 11/16/19 1617 98.2 F (36.8 C)     Temp Source 11/16/19 1617 Oral     SpO2 11/16/19 1616 100 %     Weight 11/16/19 1617 255 lb (115.7 kg)     Height 11/16/19 1617 5\' 7"  (1.702 m)     Head Circumference --      Peak Flow --      Pain Score 11/16/19 1617 0     Pain Loc --      Pain Edu? --      Excl. in GC? --     Vital signs reviewed, nursing assessments reviewed.   Constitutional:   Alert and oriented.  Ill-appearing, respiratory distress. Eyes:   Conjunctivae are normal. EOMI. PERRL. ENT      Head:   Normocephalic and atraumatic.      Nose:   Wearing a mask.      Mouth/Throat:   Wearing a mask.      Neck:   No meningismus. Full ROM. Hematological/Lymphatic/Immunilogical:   No cervical lymphadenopathy. Cardiovascular:   RRR. Symmetric bilateral radial and DP pulses.  No murmurs. Cap refill less than 2 seconds. Respiratory:   Tachypnea, diffuse expiratory wheezing and prolonged expiratory phase.  No focal crackles.  Symmetric air movement in all  lung fields Gastrointestinal:   Soft and nontender. Non distended. There is no CVA tenderness.  No rebound, rigidity, or guarding.  Musculoskeletal:   Normal range of motion in all extremities. No joint effusions.  No lower extremity tenderness.  No edema. Neurologic:   Normal speech and language.  Motor grossly intact. No acute focal neurologic deficits are appreciated.  Skin:    Skin is warm, dry and intact. No rash noted.  No petechiae, purpura, or bullae.  ____________________________________________    LABS (pertinent positives/negatives) (all labs ordered are listed, but only abnormal results are displayed) Labs Reviewed  BASIC METABOLIC PANEL - Abnormal; Notable for the following components:       Result Value   Glucose, Bld 148 (*)    BUN 35 (*)    Creatinine, Ser 1.50 (*)    Calcium 8.5 (*)    GFR calc non Af Amer 39 (*)    GFR calc Af Amer 45 (*)    All other components within normal limits  MAGNESIUM  CBC WITH DIFFERENTIAL/PLATELET  PROCALCITONIN  POC SARS CORONAVIRUS 2 AG -  ED  TROPONIN I (HIGH SENSITIVITY)   ____________________________________________   EKG  Interpreted by me Sinus rhythm rate of 94, normal axis and intervals.  Normal QRS ST segments and T waves.  ____________________________________________    RADIOLOGY  Dg Chest Portable 1 View  Result Date: 11/16/2019 CLINICAL DATA:  Dyspnea.  Respiratory distress. EXAM: PORTABLE CHEST 1 VIEW COMPARISON:  12/01/2018 FINDINGS: The heart size and mediastinal contours are within normal limits. Both lungs are clear. The visualized skeletal structures are unremarkable. IMPRESSION: No active disease. Electronically Signed   By: Signa Kellaylor  Stroud M.D.   On: 11/16/2019 17:02    ____________________________________________   PROCEDURES .Critical Care Performed by: Sharman CheekStafford, Hailee Hollick, MD Authorized by: Sharman CheekStafford, Maylyn Narvaiz, MD   Critical care provider statement:    Critical care time was exclusive of:  Separately billable procedures and treating other patients   Critical care was necessary to treat or prevent imminent or life-threatening deterioration of the following conditions:  Respiratory failure   Critical care was time spent personally by me on the following activities:  Development of treatment plan with patient or surrogate, discussions with consultants, evaluation of patient's response to treatment, examination of patient, obtaining history from patient or surrogate, ordering and performing treatments and interventions, ordering and review of laboratory studies, ordering and review of radiographic studies, pulse oximetry, re-evaluation of patient's condition and review of old  charts    ____________________________________________  DIFFERENTIAL DIAGNOSIS   COPD exacerbation, pneumothorax, pneumonia, pulmonary edema, Covid.  Doubt ACS PE dissection or carditis.  CLINICAL IMPRESSION / ASSESSMENT AND PLAN / ED COURSE  Medications ordered in the ED: Medications  magnesium sulfate IVPB 2 g 50 mL (2 g Intravenous New Bag/Given 11/16/19 1640)  albuterol (PROVENTIL) (2.5 MG/3ML) 0.083% nebulizer solution 5 mg (has no administration in time range)  cefTRIAXone (ROCEPHIN) 2 g in sodium chloride 0.9 % 100 mL IVPB (has no administration in time range)  azithromycin (ZITHROMAX) 500 mg in sodium chloride 0.9 % 250 mL IVPB (has no administration in time range)  ipratropium-albuterol (DUONEB) 0.5-2.5 (3) MG/3ML nebulizer solution 3 mL (3 mLs Nebulization Given 11/16/19 1634)  ipratropium-albuterol (DUONEB) 0.5-2.5 (3) MG/3ML nebulizer solution 3 mL (3 mLs Nebulization Given 11/16/19 1634)  methylPREDNISolone sodium succinate (SOLU-MEDROL) 125 mg/2 mL injection 125 mg (125 mg Intravenous Given 11/16/19 1629)    Pertinent labs & imaging results that were available during my care of  the patient were reviewed by me and considered in my medical decision making (see chart for details).  Cherylee A Westberg was evaluated in Emergency Department on 11/16/2019 for the symptoms described in the history of present illness. She was evaluated in the context of the global COVID-19 pandemic, which necessitated consideration that the patient might be at risk for infection with the SARS-CoV-2 virus that causes COVID-19. Institutional protocols and algorithms that pertain to the evaluation of patients at risk for COVID-19 are in a state of rapid change based on information released by regulatory bodies including the CDC and federal and state organizations. These policies and algorithms were followed during the patient's care in the ED.     Clinical Course as of Nov 15 1802  Sat Nov 16, 2019  1617  Patient presents with respiratory distress in the setting of 2 days of escalating shortness of breath dyspnea on exertion.  80% on her usual 2 L nasal cannula.  Requiring CPAP during EMS transport, placed on BiPAP on arrival in the ED.  She is feeling better but will require aggressive COPD management, chest x-ray, ceftriaxone and azithromycin for antibiotic coverage, Covid testing.  Plan to admit to hospital.   [PS]  1712 Labs reassuring, troponin negative, chest x-ray unremarkable.  Will attempt to wean off of BiPAP to nasal cannula or high flow nasal cannula.   [PS]  1741 Patient weaned off BiPAP, now sitting upright in the room saturation 97% on room air.  States she wants to be discharged to go home.  Advised that with her COPD exacerbation and severe hypoxia and respiratory distress, we recommend hospitalization to ensure that her breathing is stabilized.  She refuses and insists on going home.  She will stay for rapid Covid test prior to discharge.   [PS]  [id number] Patient decided to leave immediately without waiting to be Covid tested.  She left the ED AGAINST MEDICAL ADVICE and has decision-making capacity.   [PS]    Clinical Course User Index [PS] Carrie Mew, MD     ____________________________________________   FINAL CLINICAL IMPRESSION(S) / ED DIAGNOSES    Final diagnoses:  COPD exacerbation ([place])  Acute on chronic respiratory failure with hypoxia [facility])     ED Discharge Orders    None      Portions of this note were generated with dragon dictation software. Dictation errors may occur despite best attempts at proofreading.   Carrie Mew, MD 11/16/19 [phone]

## 2019-11-16 NOTE — ED Triage Notes (Signed)
Pt presents from home via [facility] with c/o shortness of breath. Pt has hx of copd and noticed work of breathing increasing over past 3 days. When ems arrived pt was 80% on chronic 2L. EMS noted diminished lobes and audible wheezinff and  placed pt on cpap machine. Pt switched to bipap machine upon arrival, MD Parsons at bedside.

## 2019-11-16 NOTE — Progress Notes (Signed)
Bipap removed,placed on 2lnc per MD request,tolerating well

## 2019-11-16 NOTE — ED Notes (Signed)
Pt requesting to leave. Joni Fears MD made aware. Pt given rx but signed out [place] per Joni Fears MD. Further care refused,. Ambulatory at d/c.,

## 2019-11-18 ENCOUNTER — Encounter: Payer: Self-pay | Admitting: Radiology

## 2019-11-29 IMAGING — DX DG CHEST 1V PORT
1 series · 1 of 1 positions shown · non-contrast
Comparison: Single-view of the chest 07/08/2018 and 02/19/2017. PA
and lateral chest 06/27/2018.

CLINICAL DATA: Shortness of breath for 2 days.

EXAM:
PORTABLE CHEST 1 VIEW

[chest ap]
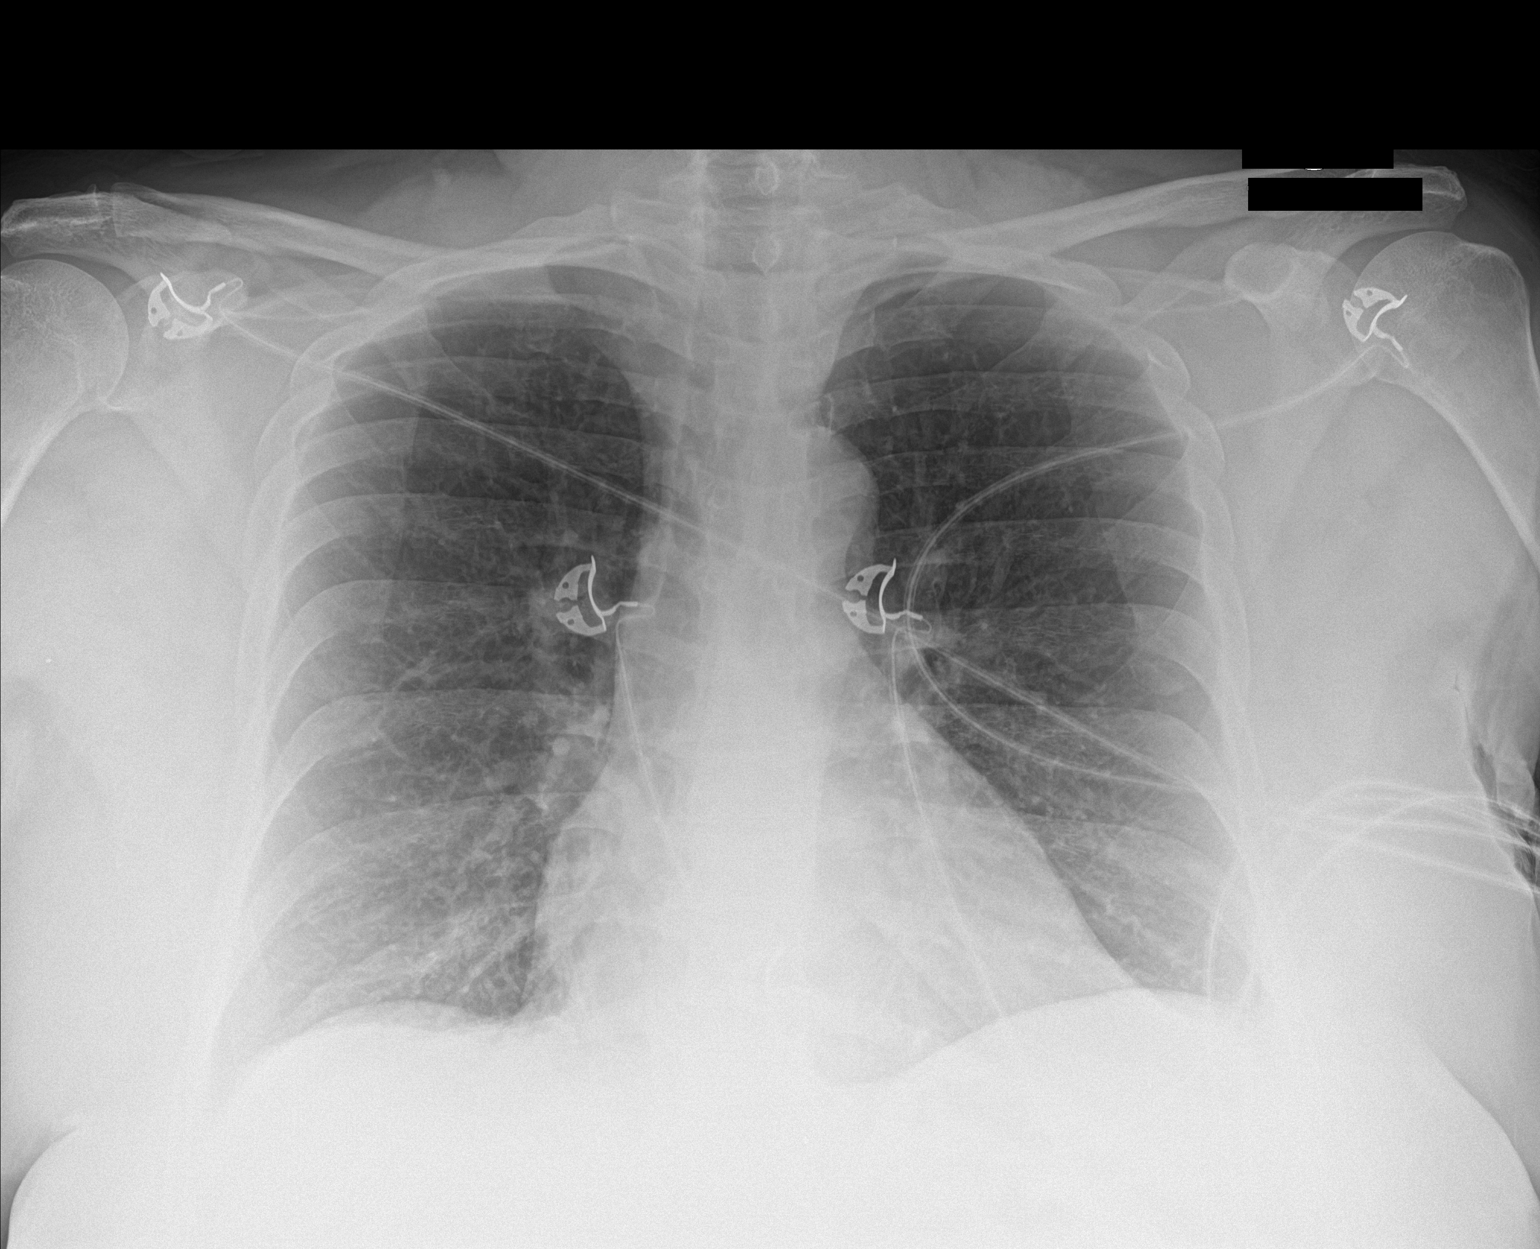

[1 of 1 positions shown; findings below may reference images not displayed]

FINDINGS: The lungs are clear. Heart size is normal. No pneumothorax or
pleural effusion. No acute or focal bony abnormality.
IMPRESSION: No acute disease.

## 2019-12-05 DIAGNOSIS — J441 Chronic obstructive pulmonary disease with (acute) exacerbation: Secondary | ICD-10-CM | POA: Diagnosis not present

## 2019-12-05 DIAGNOSIS — J9601 Acute respiratory failure with hypoxia: Secondary | ICD-10-CM | POA: Diagnosis not present

## 2019-12-11 DIAGNOSIS — J449 Chronic obstructive pulmonary disease, unspecified: Secondary | ICD-10-CM | POA: Diagnosis not present

## 2019-12-12 IMAGING — DX DG CHEST 1V PORT
1 series · 1 of 1 positions shown · non-contrast
Comparison: 07/30/2018

CLINICAL DATA: SOB x 3 days. EMS reports pt gave herself nebulizer
tx at home along with inhaler tx. Pt d/c 08/01/2018 for acute on
chronic respiratory failure and COPD exacerbation. Hx - COPD,
diabetes, HTN, current smoker.

EXAM:
PORTABLE CHEST 1 VIEW

[chest ap]
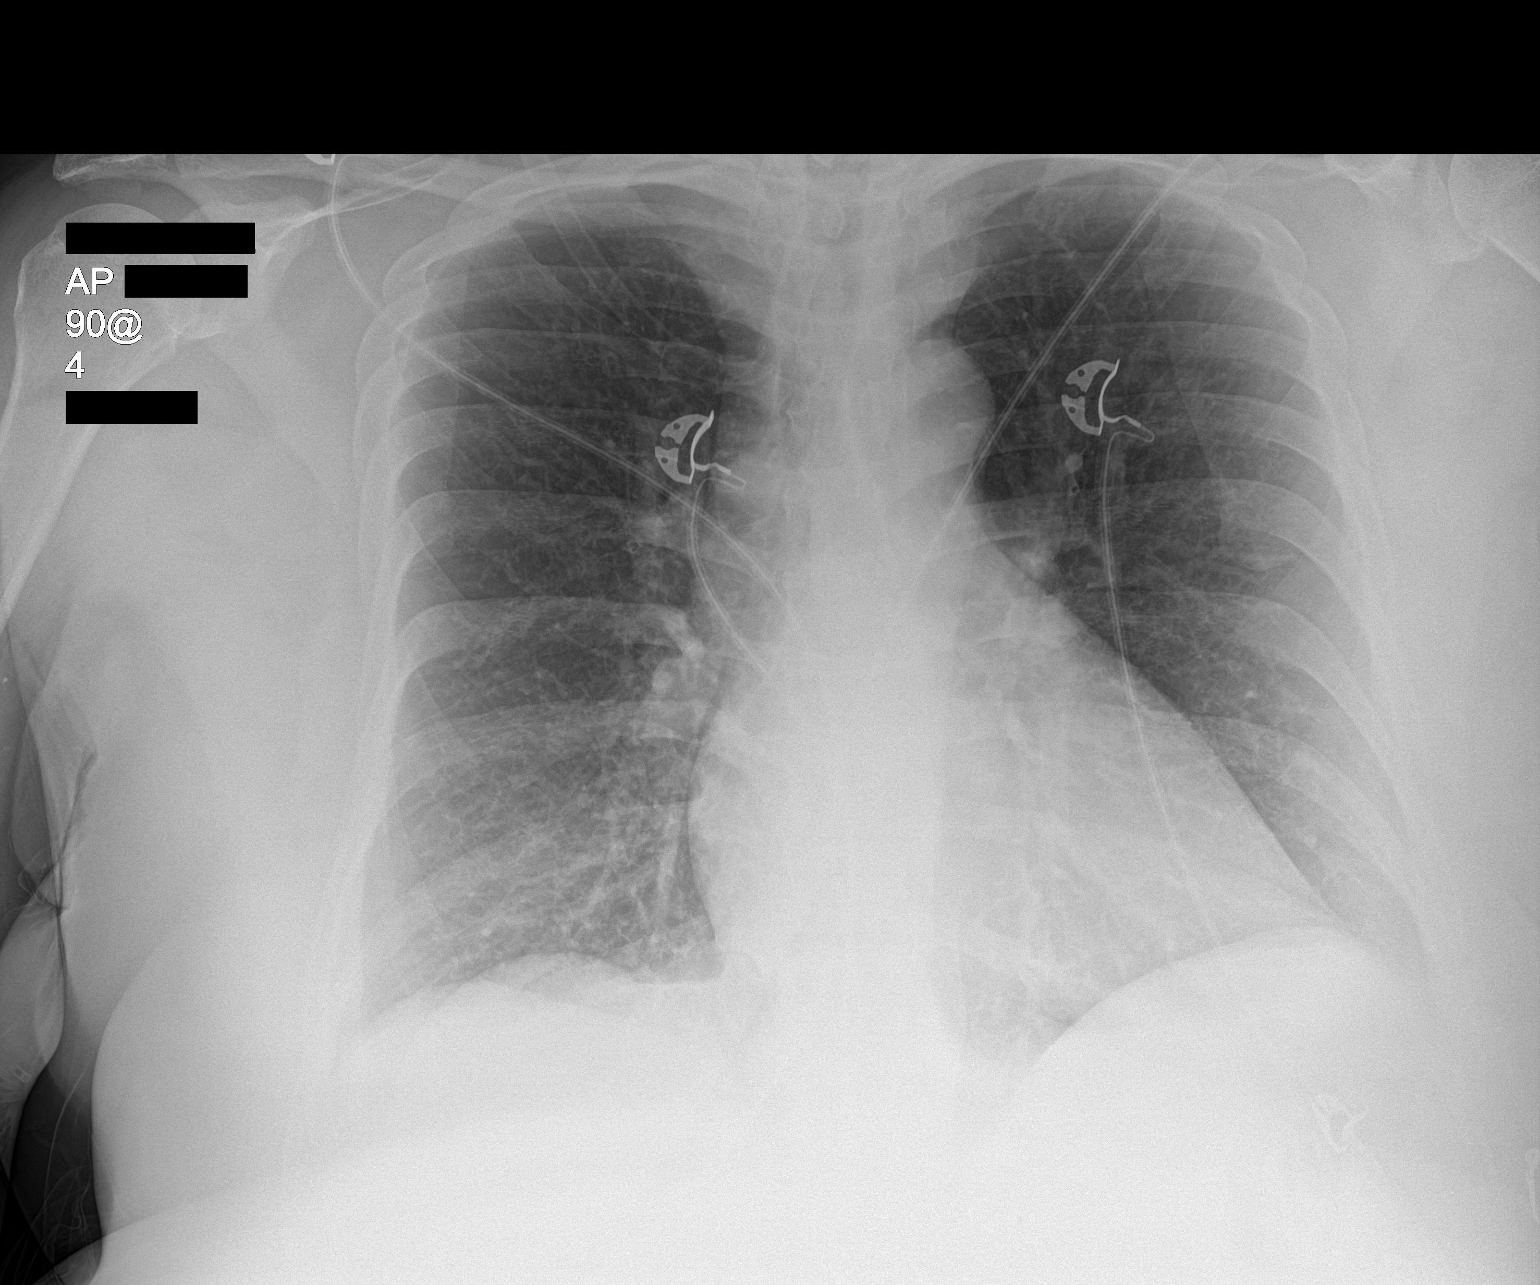

[1 of 1 positions shown; findings below may reference images not displayed]

FINDINGS: Normal cardiac silhouette. Lungs are mildly hyperinflated. No
effusion, infiltrate or pneumothorax. No acute osseous abnormality.
IMPRESSION: Mildly hyperinflated lungs.  No acute findings.

## 2019-12-14 DIAGNOSIS — J449 Chronic obstructive pulmonary disease, unspecified: Secondary | ICD-10-CM | POA: Diagnosis not present

## 2019-12-17 DIAGNOSIS — J449 Chronic obstructive pulmonary disease, unspecified: Secondary | ICD-10-CM | POA: Diagnosis not present

## 2019-12-17 DIAGNOSIS — J9621 Acute and chronic respiratory failure with hypoxia: Secondary | ICD-10-CM | POA: Diagnosis not present

## 2019-12-17 DIAGNOSIS — J9622 Acute and chronic respiratory failure with hypercapnia: Secondary | ICD-10-CM | POA: Diagnosis not present

## 2019-12-18 DIAGNOSIS — G4733 Obstructive sleep apnea (adult) (pediatric): Secondary | ICD-10-CM | POA: Diagnosis not present

## 2019-12-18 DIAGNOSIS — J449 Chronic obstructive pulmonary disease, unspecified: Secondary | ICD-10-CM | POA: Diagnosis not present

## 2019-12-18 DIAGNOSIS — J45998 Other asthma: Secondary | ICD-10-CM | POA: Diagnosis not present

## 2019-12-18 DIAGNOSIS — J9621 Acute and chronic respiratory failure with hypoxia: Secondary | ICD-10-CM | POA: Diagnosis not present

## 2019-12-30 ENCOUNTER — Other Ambulatory Visit: Payer: Self-pay

## 2019-12-30 ENCOUNTER — Emergency Department
Admission: EM | Admit: 2019-12-30 | Discharge: 2019-12-30 | Disposition: A | Discharge status: Home / Self Care | Payer: Medicare HMO | Attending: Student in an Organized Health Care Education/Training Program | Admitting: Student in an Organized Health Care Education/Training Program

## 2019-12-30 ENCOUNTER — Encounter: Payer: Self-pay | Admitting: Emergency Medicine

## 2019-12-30 ENCOUNTER — Emergency Department: Payer: Medicare HMO

## 2019-12-30 DIAGNOSIS — Z20822 Contact with and (suspected) exposure to covid-19: Secondary | ICD-10-CM | POA: Diagnosis not present

## 2019-12-30 DIAGNOSIS — Z9981 Dependence on supplemental oxygen: Secondary | ICD-10-CM | POA: Diagnosis not present

## 2019-12-30 DIAGNOSIS — F17210 Nicotine dependence, cigarettes, uncomplicated: Secondary | ICD-10-CM | POA: Insufficient documentation

## 2019-12-30 DIAGNOSIS — Z79899 Other long term (current) drug therapy: Secondary | ICD-10-CM | POA: Insufficient documentation

## 2019-12-30 DIAGNOSIS — J441 Chronic obstructive pulmonary disease with (acute) exacerbation: Secondary | ICD-10-CM | POA: Diagnosis not present

## 2019-12-30 DIAGNOSIS — Z7984 Long term (current) use of oral hypoglycemic drugs: Secondary | ICD-10-CM | POA: Diagnosis not present

## 2019-12-30 DIAGNOSIS — I10 Essential (primary) hypertension: Secondary | ICD-10-CM | POA: Insufficient documentation

## 2019-12-30 DIAGNOSIS — R069 Unspecified abnormalities of breathing: Secondary | ICD-10-CM | POA: Diagnosis not present

## 2019-12-30 DIAGNOSIS — R0602 Shortness of breath: Secondary | ICD-10-CM | POA: Diagnosis not present

## 2019-12-30 DIAGNOSIS — E119 Type 2 diabetes mellitus without complications: Secondary | ICD-10-CM | POA: Diagnosis not present

## 2019-12-30 DIAGNOSIS — Z209 Contact with and (suspected) exposure to unspecified communicable disease: Secondary | ICD-10-CM | POA: Diagnosis not present

## 2019-12-30 LAB — CBC
HCT: 43.3 % (ref 36.0–46.0)
Hemoglobin: 13.8 g/dL (ref 12.0–15.0)
MCH: 28.3 pg (ref 26.0–34.0)
MCHC: 31.9 g/dL (ref 30.0–36.0)
MCV: 88.7 fL (ref 80.0–100.0)
Platelets: 357 10*3/uL (ref 150–400)
RBC: 4.88 MIL/uL (ref 3.87–5.11)
RDW: 14.2 % (ref 11.5–15.5)
WBC: 10.5 10*3/uL (ref 4.0–10.5)
nRBC: 0 % (ref 0.0–0.2)

## 2019-12-30 LAB — BASIC METABOLIC PANEL
Anion gap: 8 (ref 5–15)
BUN: 29 mg/dL — ABNORMAL HIGH (ref 6–20)
CO2: 25 mmol/L (ref 22–32)
Calcium: 8.9 mg/dL (ref 8.9–10.3)
Chloride: 107 mmol/L (ref 98–111)
Creatinine, Ser: 1.24 mg/dL — ABNORMAL HIGH (ref 0.44–1.00)
GFR calc Af Amer: 57 mL/min — ABNORMAL LOW (ref >60)
GFR calc non Af Amer: 49 mL/min — ABNORMAL LOW (ref >60)
Glucose, Bld: 113 mg/dL — ABNORMAL HIGH (ref 70–99)
Potassium: 3.9 mmol/L (ref 3.5–5.1)
Sodium: 140 mmol/L (ref 135–145)

## 2019-12-30 LAB — POC SARS CORONAVIRUS 2 AG: SARS Coronavirus 2 Ag: NEGATIVE

## 2019-12-30 MED ORDER — METHYLPREDNISOLONE SODIUM SUCC 125 MG IJ SOLR
125.0000 mg | Freq: Once | INTRAMUSCULAR | Status: AC
  Administered 2019-12-30: 15:00:00 125 mg via INTRAVENOUS
  Filled 2019-12-30: qty 2

## 2019-12-30 MED ORDER — BUDESONIDE-FORMOTEROL FUMARATE 160-4.5 MCG/ACT IN AERO: 2.0000 | INHALATION_SPRAY | Freq: Two times a day (BID) | RESPIRATORY_TRACT | 0 refills | Status: DC

## 2019-12-30 MED ORDER — MAGNESIUM SULFATE 2 GM/50ML IV SOLN
2.0000 g | Freq: Once | INTRAVENOUS | Status: AC
  Administered 2019-12-30: 15:00:00 2 g via INTRAVENOUS
  Filled 2019-12-30: qty 50

## 2019-12-30 MED ORDER — IPRATROPIUM-ALBUTEROL 0.5-2.5 (3) MG/3ML IN SOLN
3.0000 mL | Freq: Once | RESPIRATORY_TRACT | Status: AC
  Administered 2019-12-30: 15:00:00 3 mL via RESPIRATORY_TRACT
  Filled 2019-12-30: qty 3

## 2019-12-30 MED ORDER — AZITHROMYCIN 500 MG PO TABS: 500.0000 mg | ORAL_TABLET | Freq: Every day | ORAL | 0 refills | Status: AC

## 2019-12-30 MED ORDER — PREDNISONE 20 MG PO TABS: 40.0000 mg | ORAL_TABLET | Freq: Every day | ORAL | 0 refills | Status: AC

## 2019-12-30 NOTE — ED Triage Notes (Signed)
Pt in via EMS from home. EMS reports pt has COPD and had an exposure Saturday and wants to get tested. Lungs clear.

## 2019-12-30 NOTE — ED Triage Notes (Signed)
Pt reports has COPD and her daughter called her and told her that she had COVID and she was with her daughter Saturday. Pt very anxious and concerned about covid. Pt reports she feels SOB more than her normal. Pt c/o pain to her mid back as well. Pt states is on 2L of oxygen at night only.

## 2019-12-30 NOTE — ED Provider Notes (Signed)
[facility] Emergency Department Provider Note    First MD Initiated Contact with Patient 12/30/19 1426     (approximate)  I have reviewed the triage vital signs and the nursing notes.   HISTORY  Chief Complaint Shortness of Breath    HPI Samantha [name] is a 54 y.o. female below listed past medical history does not wear chronic home O2 but does wear CPAP at night presents the ER for 3 days of worsening shortness of breath.  Feels like this is a COPD exacerbation.  Does have recent COVID-19 contact.  Her daughter just recently tested positive.  She not having fevers or chills.  Has been using her inhalers without improvement.    Past Medical History:  Diagnosis Date  . Acute on chronic respiratory failure with hypoxemia (HCC) 08/30/2015  . Asthma   . COPD (chronic obstructive pulmonary disease) (HCC)   . Diabetes mellitus without complication (HCC)   . Hypertension    Family History  Problem Relation Age of Onset  . Asthma Mother   . Diabetes Mother   . Hypertension Mother   . Asthma Sister   . Heart attack Father    Past Surgical History:  Procedure Laterality Date  . none     Patient Active Problem List   Diagnosis Date Noted  . Ventral hernia with obstruction and without gangrene 09/10/2019  . Acute respiratory failure with hypoxemia (HCC) 11/25/2018  . COPD exacerbation (HCC) 02/21/2018  . Acute on chronic respiratory failure (HCC) 10/06/2015  . Acute on chronic respiratory failure with hypoxemia (HCC) 08/30/2015      Prior to Admission medications   Medication Sig Start Date End Date Taking? Authorizing Provider  albuterol (PROVENTIL HFA) 108 (90 Base) MCG/ACT inhaler Inhale 2 puffs into the lungs every 4 (four) hours as needed for wheezing or shortness of breath. 11/16/19   Sharman Cheek, MD  albuterol (PROVENTIL HFA;VENTOLIN HFA) 108 [phone] Base) MCG/ACT inhaler Inhale 2 puffs into the lungs every 6 (six) hours as needed for  wheezing or shortness of breath. 08/01/18   Shaune Pollack, MD  azithromycin (ZITHROMAX) 500 MG tablet Take 1 tablet (500 mg total) by mouth daily for 3 days. Take 1 tablet daily for 3 days. 12/30/19 01/02/20  Willy Eddy, MD  budesonide-formoterol [facility]) 160-4.5 MCG/ACT inhaler Inhale 2 puffs into the lungs 2 (two) times daily. 12/30/19   Willy Eddy, MD  budesonide-formoterol [facility]) 160-4.5 MCG/ACT inhaler Inhale 2 puffs into the lungs 2 (two) times daily. 11/16/19   Sharman Cheek, MD  gabapentin (NEURONTIN) 300 MG capsule Take 300 mg by mouth 3 (three) times daily.     [provider]  ipratropium-albuterol (DUONEB) 0.5-2.5 (3) MG/3ML SOLN Take 3 mLs by nebulization every 6 (six) hours as needed (shortness of breath).    [provider]  losartan-hydrochlorothiazide (HYZAAR) 50-12.5 MG tablet Take 1 tablet by mouth daily. 02/22/16   [provider]  metFORMIN (GLUCOPHAGE) 850 MG tablet Take 850 mg by mouth daily.     [provider]  montelukast (SINGULAIR) 10 MG tablet Take 10 mg by mouth at bedtime.     [provider]  omeprazole (PRILOSEC) 20 MG capsule Take 20 mg by mouth daily.  05/05/19   [provider]  predniSONE (DELTASONE) 20 MG tablet Take 2 tablets (40 mg total) by mouth daily for 5 days. 12/30/19 01/04/20  Willy Eddy, MD  tiotropium (SPIRIVA) 18 MCG inhalation capsule Place 18 mcg into inhaler and inhale daily.  [provider]    Allergies Percocet [oxycodone-acetaminophen]    Social History Social History   Tobacco Use  . Smoking status: Current Every Day Smoker    Packs/day: 0.10    Years: 23.00    Pack years: 2.30    Types: Cigarettes  . Smokeless tobacco: Never Used  Substance Use Topics  . Alcohol use: Yes  . Drug use: Yes    Types: Cocaine    Comment: x 1 week ago    Review of Systems Patient denies headaches, rhinorrhea, blurry vision, numbness, shortness of breath,  chest pain, edema, cough, abdominal pain, nausea, vomiting, diarrhea, dysuria, fevers, rashes or hallucinations unless otherwise stated above in HPI. ____________________________________________   PHYSICAL EXAM:  VITAL SIGNS: Vitals:   12/30/19 1314 12/30/19 1316  BP:  (!) 191/112  Pulse: 80   Resp: (!) 24   Temp: 98.1 F (36.7 C)   SpO2: 97%     Constitutional: Alert and oriented. Tachypnea in mild respiratory distress Eyes: Conjunctivae are normal.  Head: Atraumatic. Nose: No congestion/rhinnorhea. Mouth/Throat: Mucous membranes are moist.   Neck: No stridor. Painless ROM.  Cardiovascular: Normal rate, regular rhythm. Grossly normal heart sounds.  Good peripheral circulation. Respiratory: tachypnea, use of accessory muscles, diminished breathsounds throughout, faint end expiratory wheeze. Gastrointestinal: Soft and nontender. No distention. No abdominal bruits. No CVA tenderness. Genitourinary:  Musculoskeletal: No lower extremity tenderness nor edema.  No joint effusions. Neurologic:  Normal speech and language. No gross focal neurologic deficits are appreciated. No facial droop Skin:  Skin is warm, dry and intact. No rash noted. Psychiatric: Mood and affect are normal  ____________________________________________   LABS (all labs ordered are listed, but only abnormal results are displayed)  Results for orders placed or performed during the hospital encounter of 12/30/19 (from the past 24 hour(s))  Basic metabolic panel     Status: Abnormal   Collection Time: 12/30/19  1:17 PM  Result Value Ref Range   Sodium 140 135 - 145 mmol/L   Potassium 3.9 3.5 - 5.1 mmol/L   Chloride 107 98 - 111 mmol/L   CO2 25 22 - 32 mmol/L   Glucose, Bld 113 (H) 70 - 99 mg/dL   BUN 29 (H) 6 - 20 mg/dL   Creatinine, Ser 1.24 (H) 0.44 - 1.00 mg/dL   Calcium 8.9 8.9 - 10.3 mg/dL   GFR calc non Af Amer 49 (L) >60 mL/min   GFR calc Af Amer 57 (L) >60 mL/min   Anion gap 8 5 - 15  CBC      Status: None   Collection Time: 12/30/19  1:17 PM  Result Value Ref Range   WBC 10.5 4.0 - 10.5 K/uL   RBC 4.88 3.87 - 5.11 MIL/uL   Hemoglobin 13.8 12.0 - 15.0 g/dL   HCT 43.3 36.0 - 46.0 %   MCV 88.7 80.0 - 100.0 fL   MCH 28.3 26.0 - 34.0 pg   MCHC 31.9 30.0 - 36.0 g/dL   RDW 14.2 11.5 - 15.5 %   Platelets 357 150 - 400 K/uL   nRBC 0.0 0.0 - 0.2 %  POC SARS Coronavirus 2 Ag     Status: None   Collection Time: 12/30/19  3:44 PM  Result Value Ref Range   SARS Coronavirus 2 Ag NEGATIVE NEGATIVE   ____________________________________________  EKG My review and personal interpretation at Time: 13:24   Indication: sob  Rate: 85  Rhythm: sinus Axis: nml Other: normal intervals, no stemi or depressions  ____________________________________________  RADIOLOGY  I personally reviewed all radiographic images ordered to evaluate for the above acute complaints and reviewed radiology reports and findings.  These findings were personally discussed with the patient.  Please see medical record for radiology report.  ____________________________________________   PROCEDURES  Procedure(s) performed:  Procedures    Critical Care performed: no ____________________________________________   INITIAL IMPRESSION / ASSESSMENT AND PLAN / ED COURSE  Pertinent labs & imaging results that were available during my care of the patient were reviewed by me and considered in my medical decision making (see chart for details).   DDX: Asthma, copd, covid 19, CHF, pna, ptx, malignancy, Pe, anemia   Samantha [name] is a 54 y.o. who presents to the ED with shortness of breath as described above.  Patient protecting her airway.  No stridor on exam.  Presentation consistent with COPD exacerbation.  Blood will be sent for by differential.  Will give nebulizer as well as steroids.  Will test for Covid though she is not febrile and does not have any evidence of infiltrates on chest x-ray.  Clinical Course  as of Dec 30 1647  Mon Dec 30, 2019  1646 Patient feels significantly improved.  This is not c/w chf, pna, pe.  I recommended further observation in the ER to insure the steroids are working and she won't have any rebound SOB.  She declines further observation stating she feels at her baseline. Able to ambulate without any dyspnea or hypoxia.       [PR]    Clinical Course User Index [PR] Willy Eddy, MD    The patient was evaluated in Emergency Department today for the symptoms described in the history of present illness. He/she was evaluated in the context of the global COVID-19 pandemic, which necessitated consideration that the patient might be at risk for infection with the SARS-CoV-2 virus that causes COVID-19. Institutional protocols and algorithms that pertain to the evaluation of patients at risk for COVID-19 are in a state of rapid change based on information released by regulatory bodies including the CDC and federal and state organizations. These policies and algorithms were followed during the patient's care in the ED.  As part of my medical decision making, I reviewed the following data within the electronic MEDICAL RECORD NUMBER Nursing notes reviewed and incorporated, Labs reviewed, notes from prior ED visits and [place] Controlled Substance Database   ____________________________________________   FINAL CLINICAL IMPRESSION(S) / ED DIAGNOSES  Final diagnoses:  COPD exacerbation (HCC)      NEW MEDICATIONS STARTED DURING THIS VISIT:  New Prescriptions   AZITHROMYCIN (ZITHROMAX) 500 MG TABLET    Take 1 tablet (500 mg total) by mouth daily for 3 days. Take 1 tablet daily for 3 days.   PREDNISONE (DELTASONE) 20 MG TABLET    Take 2 tablets (40 mg total) by mouth daily for 5 days.     Note:  This document was prepared using Dragon voice recognition software and may include unintentional dictation errors.    Willy Eddy, MD 12/30/19 [phone]

## 2019-12-30 NOTE — ED Notes (Signed)
Pt ambulated without assistance around the room. Pt O2 sat maintained around 98-99% on RA.

## 2019-12-30 NOTE — ED Notes (Signed)
Patient ambulatory to lobby with steady gait and NAD noted. Verbalized understanding of discharge instructions and follow-up care.  

## 2019-12-31 LAB — SARS CORONAVIRUS 2 (TAT 6-24 HRS): SARS Coronavirus 2: NEGATIVE

## 2020-01-11 DIAGNOSIS — J449 Chronic obstructive pulmonary disease, unspecified: Secondary | ICD-10-CM | POA: Diagnosis not present

## 2020-01-14 DIAGNOSIS — J9621 Acute and chronic respiratory failure with hypoxia: Secondary | ICD-10-CM | POA: Diagnosis not present

## 2020-01-14 DIAGNOSIS — G4733 Obstructive sleep apnea (adult) (pediatric): Secondary | ICD-10-CM | POA: Diagnosis not present

## 2020-01-14 DIAGNOSIS — J449 Chronic obstructive pulmonary disease, unspecified: Secondary | ICD-10-CM | POA: Diagnosis not present

## 2020-01-14 DIAGNOSIS — J45998 Other asthma: Secondary | ICD-10-CM | POA: Diagnosis not present

## 2020-01-17 DIAGNOSIS — J9621 Acute and chronic respiratory failure with hypoxia: Secondary | ICD-10-CM | POA: Diagnosis not present

## 2020-01-17 DIAGNOSIS — J9622 Acute and chronic respiratory failure with hypercapnia: Secondary | ICD-10-CM | POA: Diagnosis not present

## 2020-01-17 DIAGNOSIS — J449 Chronic obstructive pulmonary disease, unspecified: Secondary | ICD-10-CM | POA: Diagnosis not present

## 2020-01-18 DIAGNOSIS — G4733 Obstructive sleep apnea (adult) (pediatric): Secondary | ICD-10-CM | POA: Diagnosis not present

## 2020-01-18 DIAGNOSIS — J9621 Acute and chronic respiratory failure with hypoxia: Secondary | ICD-10-CM | POA: Diagnosis not present

## 2020-01-18 DIAGNOSIS — J449 Chronic obstructive pulmonary disease, unspecified: Secondary | ICD-10-CM | POA: Diagnosis not present

## 2020-01-18 DIAGNOSIS — J45998 Other asthma: Secondary | ICD-10-CM | POA: Diagnosis not present

## 2020-01-22 ENCOUNTER — Emergency Department: Payer: Medicare HMO

## 2020-01-22 ENCOUNTER — Emergency Department
Admission: EM | Admit: 2020-01-22 | Discharge: 2020-01-22 | Disposition: A | Discharge status: Home / Self Care | Payer: Medicare HMO | Attending: Emergency Medicine | Admitting: Emergency Medicine

## 2020-01-22 ENCOUNTER — Other Ambulatory Visit: Payer: Self-pay

## 2020-01-22 DIAGNOSIS — F17210 Nicotine dependence, cigarettes, uncomplicated: Secondary | ICD-10-CM | POA: Insufficient documentation

## 2020-01-22 DIAGNOSIS — J441 Chronic obstructive pulmonary disease with (acute) exacerbation: Secondary | ICD-10-CM

## 2020-01-22 DIAGNOSIS — Z743 Need for continuous supervision: Secondary | ICD-10-CM | POA: Diagnosis not present

## 2020-01-22 DIAGNOSIS — Z7984 Long term (current) use of oral hypoglycemic drugs: Secondary | ICD-10-CM | POA: Insufficient documentation

## 2020-01-22 DIAGNOSIS — R0689 Other abnormalities of breathing: Secondary | ICD-10-CM | POA: Diagnosis not present

## 2020-01-22 DIAGNOSIS — I10 Essential (primary) hypertension: Secondary | ICD-10-CM | POA: Diagnosis not present

## 2020-01-22 DIAGNOSIS — R069 Unspecified abnormalities of breathing: Secondary | ICD-10-CM | POA: Diagnosis not present

## 2020-01-22 DIAGNOSIS — E119 Type 2 diabetes mellitus without complications: Secondary | ICD-10-CM | POA: Insufficient documentation

## 2020-01-22 DIAGNOSIS — R0602 Shortness of breath: Secondary | ICD-10-CM | POA: Diagnosis not present

## 2020-01-22 LAB — CBC WITH DIFFERENTIAL/PLATELET
Abs Immature Granulocytes: 0.04 10*3/uL (ref 0.00–0.07)
Basophils Absolute: 0.1 10*3/uL (ref 0.0–0.1)
Basophils Relative: 1 %
Eosinophils Absolute: 0.4 10*3/uL (ref 0.0–0.5)
Eosinophils Relative: 4 %
HCT: 44.6 % (ref 36.0–46.0)
Hemoglobin: 14.4 g/dL (ref 12.0–15.0)
Immature Granulocytes: 1 %
Lymphocytes Relative: 41 %
Lymphs Abs: 3.5 10*3/uL (ref 0.7–4.0)
MCH: 28.7 pg (ref 26.0–34.0)
MCHC: 32.3 g/dL (ref 30.0–36.0)
MCV: 88.8 fL (ref 80.0–100.0)
Monocytes Absolute: 0.4 10*3/uL (ref 0.1–1.0)
Monocytes Relative: 5 %
Neutro Abs: 4.1 10*3/uL (ref 1.7–7.7)
Neutrophils Relative %: 48 %
Platelets: 342 10*3/uL (ref 150–400)
RBC: 5.02 MIL/uL (ref 3.87–5.11)
RDW: 14.2 % (ref 11.5–15.5)
WBC: 8.6 10*3/uL (ref 4.0–10.5)
nRBC: 0 % (ref 0.0–0.2)

## 2020-01-22 LAB — COMPREHENSIVE METABOLIC PANEL
ALT: 63 U/L — ABNORMAL HIGH (ref 0–44)
AST: 28 U/L (ref 15–41)
Albumin: 3.6 g/dL (ref 3.5–5.0)
Alkaline Phosphatase: 90 U/L (ref 38–126)
Anion gap: 11 (ref 5–15)
BUN: 31 mg/dL — ABNORMAL HIGH (ref 6–20)
CO2: 22 mmol/L (ref 22–32)
Calcium: 8.7 mg/dL — ABNORMAL LOW (ref 8.9–10.3)
Chloride: 107 mmol/L (ref 98–111)
Creatinine, Ser: 1.36 mg/dL — ABNORMAL HIGH (ref 0.44–1.00)
GFR calc Af Amer: 51 mL/min — ABNORMAL LOW (ref >60)
GFR calc non Af Amer: 44 mL/min — ABNORMAL LOW (ref >60)
Glucose, Bld: 133 mg/dL — ABNORMAL HIGH (ref 70–99)
Potassium: 3.7 mmol/L (ref 3.5–5.1)
Sodium: 140 mmol/L (ref 135–145)
Total Bilirubin: 0.6 mg/dL (ref 0.3–1.2)
Total Protein: 6.4 g/dL — ABNORMAL LOW (ref 6.5–8.1)

## 2020-01-22 LAB — TROPONIN I (HIGH SENSITIVITY): Troponin I (High Sensitivity): 5 ng/L (ref <18)

## 2020-01-22 MED ORDER — HYDROCHLOROTHIAZIDE 12.5 MG PO CAPS: 12.5000 mg | ORAL_CAPSULE | Freq: Every day | ORAL | Status: DC

## 2020-01-22 MED ORDER — BUDESONIDE-FORMOTEROL FUMARATE 160-4.5 MCG/ACT IN AERO: 2.0000 | INHALATION_SPRAY | Freq: Two times a day (BID) | RESPIRATORY_TRACT | 0 refills | Status: DC

## 2020-01-22 MED ORDER — IPRATROPIUM-ALBUTEROL 0.5-2.5 (3) MG/3ML IN SOLN
3.0000 mL | Freq: Once | RESPIRATORY_TRACT | Status: AC
  Administered 2020-01-22: 04:00:00 3 mL via RESPIRATORY_TRACT
  Filled 2020-01-22: qty 3

## 2020-01-22 MED ORDER — PREDNISONE 20 MG PO TABS: 60.0000 mg | ORAL_TABLET | Freq: Every day | ORAL | 0 refills | Status: AC

## 2020-01-22 MED ORDER — HYDROCHLOROTHIAZIDE 12.5 MG PO CAPS
12.5000 mg | ORAL_CAPSULE | Freq: Once | ORAL | Status: AC
  Administered 2020-01-22: 12.5 mg via ORAL

## 2020-01-22 MED ORDER — IPRATROPIUM-ALBUTEROL 0.5-2.5 (3) MG/3ML IN SOLN
3.0000 mL | Freq: Once | RESPIRATORY_TRACT | Status: AC
  Administered 2020-01-22: 3 mL via RESPIRATORY_TRACT

## 2020-01-22 MED ORDER — METHYLPREDNISOLONE SODIUM SUCC 125 MG IJ SOLR
125.0000 mg | Freq: Once | INTRAMUSCULAR | Status: AC
  Administered 2020-01-22: 04:00:00 125 mg via INTRAVENOUS
  Filled 2020-01-22: qty 2

## 2020-01-22 MED ORDER — MAGNESIUM SULFATE 2 GM/50ML IV SOLN
2.0000 g | Freq: Once | INTRAVENOUS | Status: AC
  Administered 2020-01-22: 2 g via INTRAVENOUS
  Filled 2020-01-22: qty 50

## 2020-01-22 MED ORDER — LOSARTAN POTASSIUM 50 MG PO TABS
50.0000 mg | ORAL_TABLET | Freq: Once | ORAL | Status: AC
  Administered 2020-01-22: 50 mg via ORAL
  Filled 2020-01-22: qty 1

## 2020-01-22 NOTE — ED Provider Notes (Signed)
[facility] Emergency Department Provider Note  ____________________________________________  Time seen: Approximately 3:58 AM  I have reviewed the triage vital signs and the nursing notes.   HISTORY  Chief Complaint Shortness of Breath   HPI Samantha [name] is a 54 y.o. female with history of asthma, COPD, OSA on CPAP, DM, HTN who presents for evaluation of SOB. Symptoms progressively worsening over the last 2 days. Dry cough and wheezing. Has been using nebulizer and inhaler with no relief. No fever, CP, lost of taste or smell, body aches, sore throat, vomiting or diarrhea. No prior history of PE/ DVT, no recent travel immobilization, no leg pain or swelling, no hemoptysis or exogenous hormones.  This evening her shortness of breath became severe which prompted EMS to be called.   No hypoxia noted per EMS. No known exposure to COVID.   Past Medical History:  Diagnosis Date  . Acute on chronic respiratory failure with hypoxemia (HCC) 08/30/2015  . Asthma   . COPD (chronic obstructive pulmonary disease) (HCC)   . Diabetes mellitus without complication (HCC)   . Hypertension     Patient Active Problem List   Diagnosis Date Noted  . Ventral hernia with obstruction and without gangrene 09/10/2019  . Acute respiratory failure with hypoxemia (HCC) 11/25/2018  . COPD exacerbation (HCC) 02/21/2018  . Acute on chronic respiratory failure (HCC) 10/06/2015  . Acute on chronic respiratory failure with hypoxemia (HCC) 08/30/2015    Past Surgical History:  Procedure Laterality Date  . none      Prior to Admission medications   Medication Sig Start Date End Date Taking? Authorizing Provider  albuterol (PROVENTIL HFA) 108 (90 Base) MCG/ACT inhaler Inhale 2 puffs into the lungs every 4 (four) hours as needed for wheezing or shortness of breath. 11/16/19   Sharman Cheek, MD  albuterol (PROVENTIL HFA;VENTOLIN HFA) 108 [phone] Base) MCG/ACT inhaler Inhale 2 puffs  into the lungs every 6 (six) hours as needed for wheezing or shortness of breath. 08/01/18   Shaune Pollack, MD  budesonide-formoterol [facility]) 160-4.5 MCG/ACT inhaler Inhale 2 puffs into the lungs 2 (two) times daily. 11/16/19   Sharman Cheek, MD  budesonide-formoterol [facility]) 160-4.5 MCG/ACT inhaler Inhale 2 puffs into the lungs 2 (two) times daily. 01/22/20   Nita Sickle, MD  gabapentin (NEURONTIN) 300 MG capsule Take 300 mg by mouth 3 (three) times daily.     [provider]  ipratropium-albuterol (DUONEB) 0.5-2.5 (3) MG/3ML SOLN Take 3 mLs by nebulization every 6 (six) hours as needed (shortness of breath).    [provider]  losartan-hydrochlorothiazide (HYZAAR) 50-12.5 MG tablet Take 1 tablet by mouth daily. 02/22/16   [provider]  metFORMIN (GLUCOPHAGE) 850 MG tablet Take 850 mg by mouth daily.     [provider]  montelukast (SINGULAIR) 10 MG tablet Take 10 mg by mouth at bedtime.     [provider]  omeprazole (PRILOSEC) 20 MG capsule Take 20 mg by mouth daily.  05/05/19   [provider]  predniSONE (DELTASONE) 20 MG tablet Take 3 tablets (60 mg total) by mouth daily for 4 days. 01/22/20 01/26/20  Nita Sickle, MD  tiotropium (SPIRIVA) 18 MCG inhalation capsule Place 18 mcg into inhaler and inhale daily.    [provider]    Allergies Percocet [oxycodone-acetaminophen]  Family History  Problem Relation Age of Onset  . Asthma Mother   . Diabetes Mother   . Hypertension Mother   . Asthma Sister   .  Heart attack Father     Social History Social History   Tobacco Use  . Smoking status: Current Every Day Smoker    Packs/day: 0.10    Years: 23.00    Pack years: 2.30    Types: Cigarettes  . Smokeless tobacco: Never Used  Substance Use Topics  . Alcohol use: Yes  . Drug use: Yes    Types: Cocaine    Comment: x 1 week ago    Review of Systems  Constitutional: Negative for fever. Eyes:  Negative for visual changes. ENT: Negative for sore throat. Neck: No neck pain  Cardiovascular: Negative for chest pain. Respiratory: + shortness of breath, cough, wheezing Gastrointestinal: Negative for abdominal pain, vomiting or diarrhea. Genitourinary: Negative for dysuria. Musculoskeletal: Negative for back pain. Skin: Negative for rash. Neurological: Negative for headaches, weakness or numbness. Psych: No SI or HI  ____________________________________________   PHYSICAL EXAM:  VITAL SIGNS: ED Triage Vitals  Enc Vitals Group     BP 01/22/20 0353 (!) 198/111     Pulse Rate 01/22/20 0353 87     Resp 01/22/20 0353 19     Temp 01/22/20 0353 98.1 F (36.7 C)     Temp src --      SpO2 01/22/20 0353 95 %     Weight 01/22/20 0352 265 lb (120.2 kg)     Height 01/22/20 0352 5\' 7"  (1.702 m)     Head Circumference --      Peak Flow --      Pain Score 01/22/20 0352 0     Pain Loc --      Pain Edu? --      Excl. in GC? --     Constitutional: Alert and oriented, mild respiratory distress. HEENT:      Head: Normocephalic and atraumatic.         Eyes: Conjunctivae are normal. Sclera is non-icteric.       Mouth/Throat: Mucous membranes are moist.       Neck: Supple with no signs of meningismus. Cardiovascular: Regular rate and rhythm. No murmurs, gallops, or rubs. 2+ symmetrical distal pulses are present in all extremities. No JVD. Respiratory: Increased work of breathing, normal sats, severely diminished air movement bilaterally with no wheezing  gastrointestinal: Soft, non tender, and non distended with positive bowel sounds. No rebound or guarding. Musculoskeletal: Nontender with normal range of motion in all extremities. No edema, cyanosis, or erythema of extremities. Neurologic: Normal speech and language. Face is symmetric. Moving all extremities. No gross focal neurologic deficits are appreciated. Skin: Skin is warm, dry and intact. No rash noted. Psychiatric: Mood and  affect are normal. Speech and behavior are normal.  ____________________________________________   LABS (all labs ordered are listed, but only abnormal results are displayed)  Labs Reviewed  COMPREHENSIVE METABOLIC PANEL - Abnormal; Notable for the following components:      Result Value   Glucose, Bld 133 (*)    BUN 31 (*)    Creatinine, Ser 1.36 (*)    Calcium 8.7 (*)    Total Protein 6.4 (*)    ALT 63 (*)    GFR calc non Af Amer 44 (*)    GFR calc Af Amer 51 (*)    All other components within normal limits  CBC WITH DIFFERENTIAL/PLATELET  TROPONIN I (HIGH SENSITIVITY)  TROPONIN I (HIGH SENSITIVITY)   ____________________________________________  EKG  ED ECG REPORT I, 03/21/20, the attending physician, personally viewed and interpreted this ECG.  Normal sinus  rhythm, rate of 90, normal intervals, normal axis, no ST elevations or depressions.  Unchanged from prior. ____________________________________________  RADIOLOGY  I have personally reviewed the images performed during this visit and I agree with the Radiologist's read.   Interpretation by Radiologist:  DG Chest Portable 1 View  Result Date: 01/22/2020 CLINICAL DATA:  Shortness of breath EXAM: PORTABLE CHEST 1 VIEW COMPARISON:  Radiograph 12/30/2019 FINDINGS: Streaky basilar opacities favoring atelectasis. No focal consolidation. No convincing features of edema. No pneumothorax or effusion. The cardiomediastinal contours are unremarkable. No acute osseous or soft tissue abnormality. Telemetry leads overlie the chest. Mask wire at the base of the neck. IMPRESSION: Streaky basilar opacities favoring atelectasis. Electronically Signed   By: Lovena Le M.D.   On: 01/22/2020 04:14      ____________________________________________   PROCEDURES  Procedure(s) performed: None Procedures Critical Care performed:  None ____________________________________________   INITIAL IMPRESSION / ASSESSMENT AND  PLAN / ED COURSE  54 y.o. female with history of asthma, COPD, OSA on CPAP, DM, HTN who presents for evaluation of SOB.  Patient arrives in mild respiratory distress, no hypoxia but tachypneic with severely diminished air movement bilaterally consistent with COPD/asthma exacerbation.  Patient denies any signs of infection including productive cough, fever, body aches.  She also denies any pain in her chest.  EKG showed no acute ischemic changes.  No prior history of PE or DVT.  Will get a chest x-ray, basic labs, and treat with duo nebs, Solu-Medrol and magnesium.    _________________________ 5:09 AM on 01/22/2020 -----------------------------------------  Patient feels markedly improved with normal WOB, moving good air with no wheezing. CXR negative for PNA. Labs with no acute findings. Will dc home on prednisone. Patient requesting refill of symbicort. She has albuterol at home. BP trending down after patient given her am antihypertensives. Discussed return precautions and close f/u with PCP.    As part of my medical decision making, I reviewed the following data within the [place] notes reviewed and incorporated, Labs reviewed , EKG interpreted , Old EKG reviewed, Old chart reviewed, Radiograph reviewed , Notes from prior ED visits and [place] Controlled Substance Database   Please note:  Patient was evaluated in Emergency Department today for the symptoms described in the history of present illness. Patient was evaluated in the context of the global COVID-19 pandemic, which necessitated consideration that the patient might be at risk for infection with the SARS-CoV-2 virus that causes COVID-19. Institutional protocols and algorithms that pertain to the evaluation of patients at risk for COVID-19 are in a state of rapid change based on information released by regulatory bodies including the CDC and federal and state organizations. These policies and algorithms were followed  during the patient's care in the ED.  Some ED evaluations and interventions may be delayed as a result of limited staffing during the pandemic.   ____________________________________________   FINAL CLINICAL IMPRESSION(S) / ED DIAGNOSES   Final diagnoses:  COPD exacerbation ([place])      NEW MEDICATIONS STARTED DURING THIS VISIT:  ED Discharge Orders         Ordered    predniSONE (DELTASONE) 20 MG tablet  Daily     01/22/20 0508    budesonide-formoterol (SYMBICORT) 160-4.5 MCG/ACT inhaler  2 times daily     01/22/20 0508           Note:  This document was prepared using Dragon voice recognition software and may include unintentional dictation errors.  Don Perking, [place], MD 01/22/20 [phone]

## 2020-01-22 NOTE — ED Triage Notes (Signed)
Pt brought in by [facility] from home for co shob since yesterday. Hx of asthma and copd, has used inhalers at home without relief.

## 2020-01-27 IMAGING — CR DG CHEST 2V
2 series · 2 of 2 positions shown · non-contrast
Comparison: Multiple prior radiographs most recently 08/12/2018.

CLINICAL DATA: Shortness of breath.  Productive cough.

EXAM:
CHEST - 2 VIEW

[chest lat]
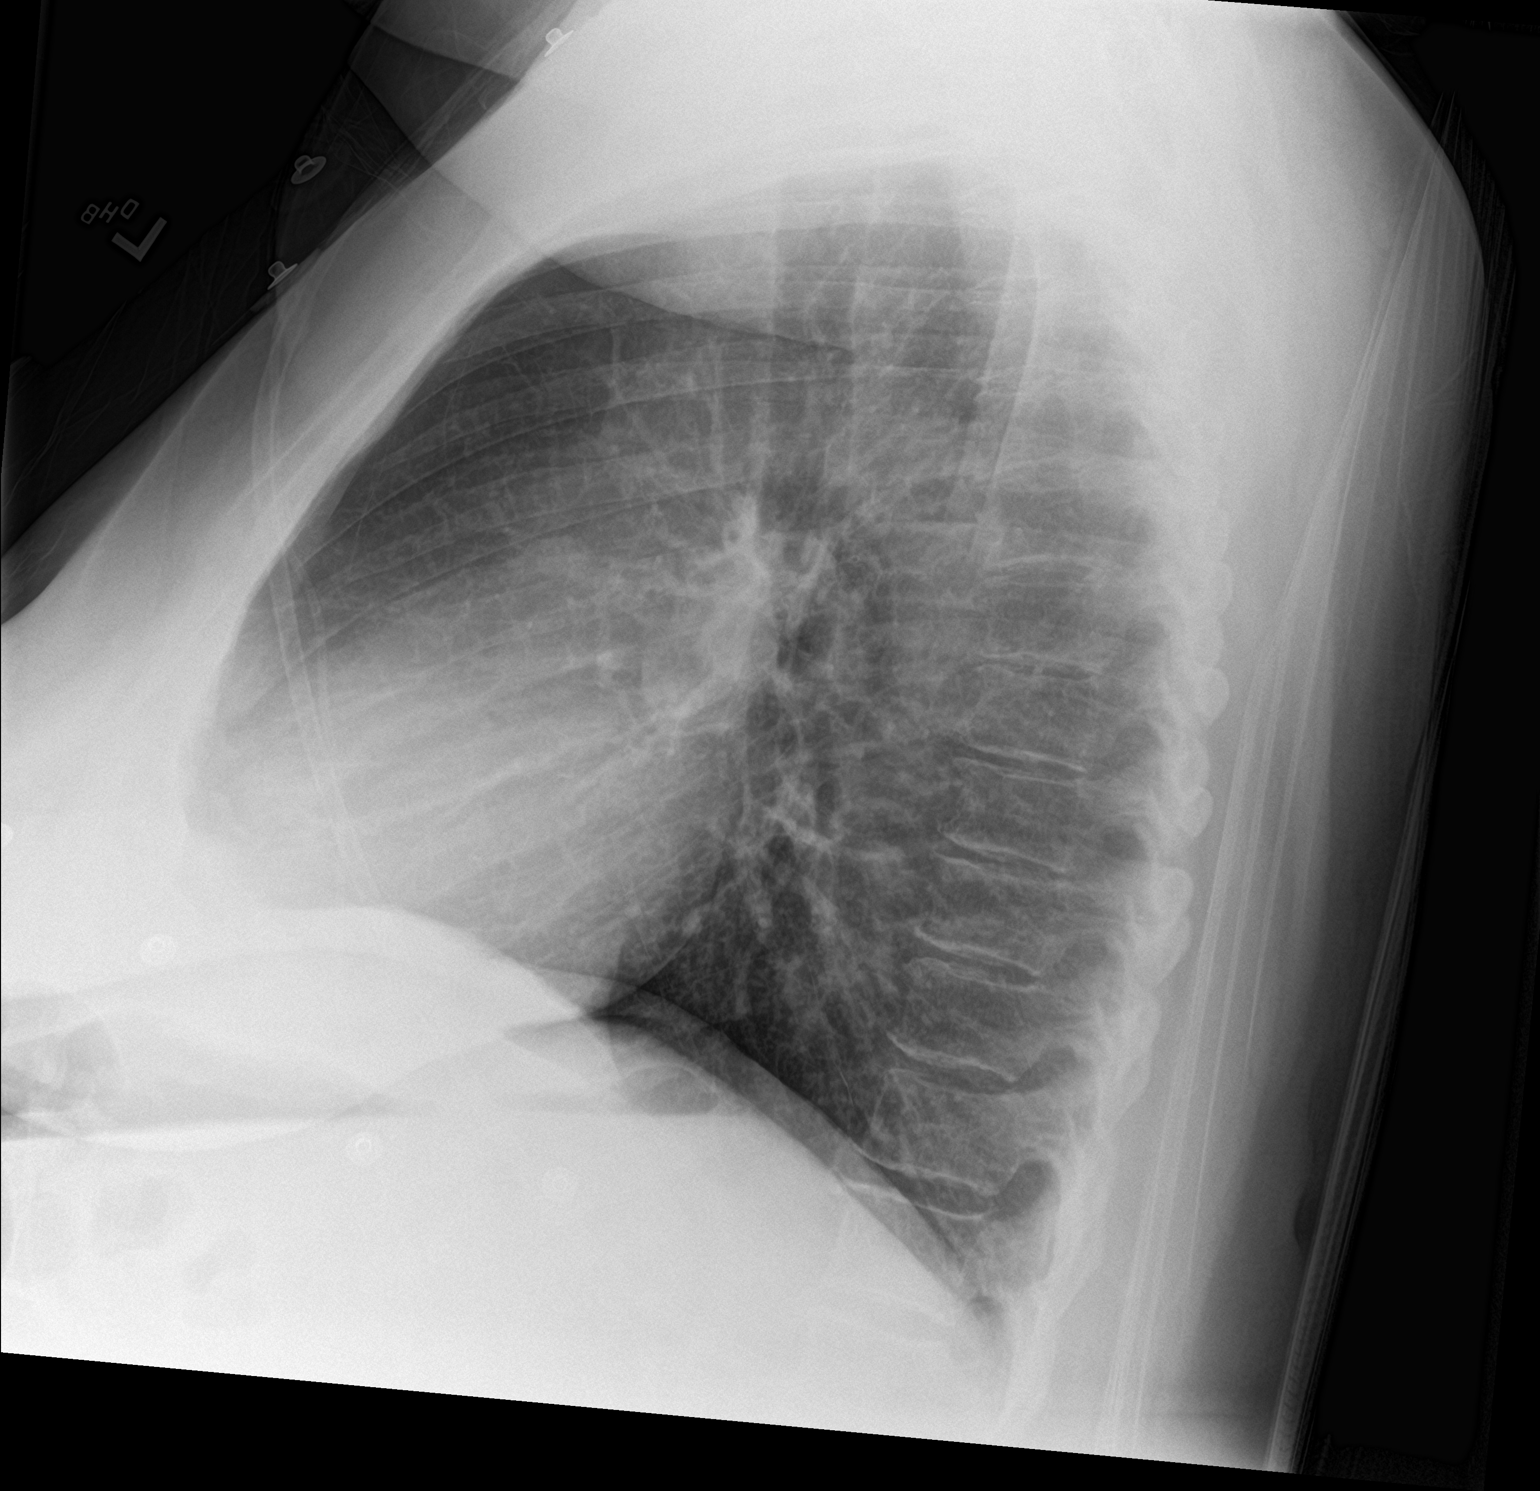

[chest ap]
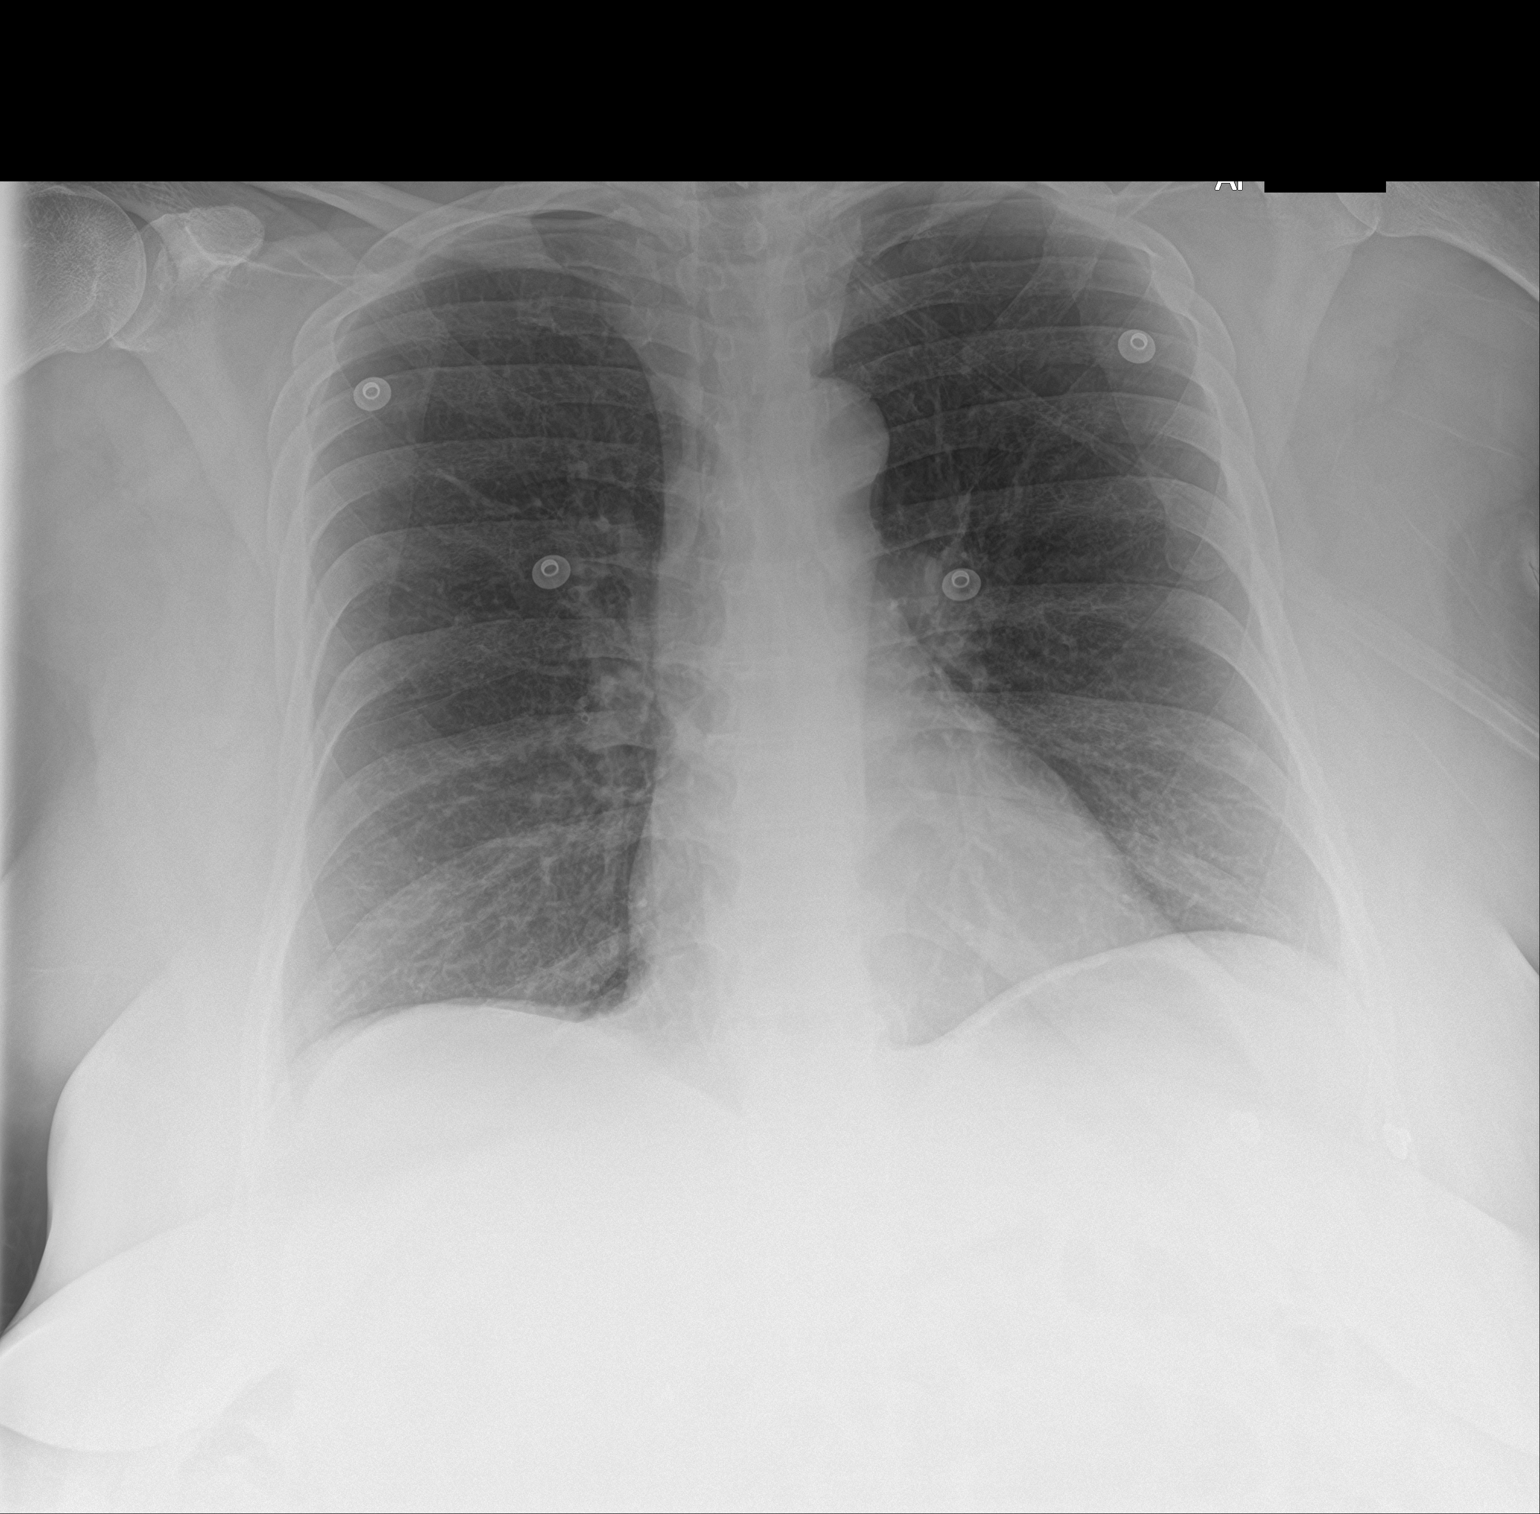

[2 of 2 positions shown; findings below may reference images not displayed]

FINDINGS: The cardiomediastinal contours are normal. Mild central bronchial
thickening. Pulmonary vasculature is normal. No consolidation,
pleural effusion, or pneumothorax. No acute osseous abnormalities
are seen.
IMPRESSION: Mild central bronchial thickening.  No focal airspace disease.

## 2020-02-03 ENCOUNTER — Emergency Department: Payer: Medicare HMO

## 2020-02-03 ENCOUNTER — Encounter: Payer: Self-pay | Admitting: Intensive Care

## 2020-02-03 ENCOUNTER — Emergency Department
Admission: EM | Admit: 2020-02-03 | Discharge: 2020-02-03 | Disposition: A | Discharge status: Home / Self Care | Payer: Medicare HMO | Attending: Emergency Medicine | Admitting: Emergency Medicine

## 2020-02-03 ENCOUNTER — Other Ambulatory Visit: Payer: Self-pay

## 2020-02-03 DIAGNOSIS — J441 Chronic obstructive pulmonary disease with (acute) exacerbation: Secondary | ICD-10-CM | POA: Insufficient documentation

## 2020-02-03 DIAGNOSIS — R062 Wheezing: Secondary | ICD-10-CM | POA: Insufficient documentation

## 2020-02-03 DIAGNOSIS — R069 Unspecified abnormalities of breathing: Secondary | ICD-10-CM | POA: Diagnosis not present

## 2020-02-03 DIAGNOSIS — F17210 Nicotine dependence, cigarettes, uncomplicated: Secondary | ICD-10-CM | POA: Insufficient documentation

## 2020-02-03 DIAGNOSIS — E119 Type 2 diabetes mellitus without complications: Secondary | ICD-10-CM | POA: Insufficient documentation

## 2020-02-03 DIAGNOSIS — I10 Essential (primary) hypertension: Secondary | ICD-10-CM | POA: Diagnosis not present

## 2020-02-03 DIAGNOSIS — F1410 Cocaine abuse, uncomplicated: Secondary | ICD-10-CM | POA: Diagnosis not present

## 2020-02-03 DIAGNOSIS — Z79899 Other long term (current) drug therapy: Secondary | ICD-10-CM | POA: Diagnosis not present

## 2020-02-03 DIAGNOSIS — J80 Acute respiratory distress syndrome: Secondary | ICD-10-CM | POA: Diagnosis not present

## 2020-02-03 DIAGNOSIS — R0602 Shortness of breath: Secondary | ICD-10-CM | POA: Diagnosis not present

## 2020-02-03 LAB — COMPREHENSIVE METABOLIC PANEL
ALT: 39 U/L (ref 0–44)
AST: 22 U/L (ref 15–41)
Albumin: 3.6 g/dL (ref 3.5–5.0)
Alkaline Phosphatase: 79 U/L (ref 38–126)
Anion gap: 8 (ref 5–15)
BUN: 22 mg/dL — ABNORMAL HIGH (ref 6–20)
CO2: 24 mmol/L (ref 22–32)
Calcium: 8.9 mg/dL (ref 8.9–10.3)
Chloride: 107 mmol/L (ref 98–111)
Creatinine, Ser: 1.37 mg/dL — ABNORMAL HIGH (ref 0.44–1.00)
GFR calc Af Amer: 50 mL/min — ABNORMAL LOW (ref >60)
GFR calc non Af Amer: 43 mL/min — ABNORMAL LOW (ref >60)
Glucose, Bld: 128 mg/dL — ABNORMAL HIGH (ref 70–99)
Potassium: 4.5 mmol/L (ref 3.5–5.1)
Sodium: 139 mmol/L (ref 135–145)
Total Bilirubin: 0.7 mg/dL (ref 0.3–1.2)
Total Protein: 6.8 g/dL (ref 6.5–8.1)

## 2020-02-03 LAB — CBC WITH DIFFERENTIAL/PLATELET
Abs Immature Granulocytes: 0.04 10*3/uL (ref 0.00–0.07)
Basophils Absolute: 0.1 10*3/uL (ref 0.0–0.1)
Basophils Relative: 1 %
Eosinophils Absolute: 0.3 10*3/uL (ref 0.0–0.5)
Eosinophils Relative: 3 %
HCT: 47.2 % — ABNORMAL HIGH (ref 36.0–46.0)
Hemoglobin: 15 g/dL (ref 12.0–15.0)
Immature Granulocytes: 0 %
Lymphocytes Relative: 33 %
Lymphs Abs: 3 10*3/uL (ref 0.7–4.0)
MCH: 28.1 pg (ref 26.0–34.0)
MCHC: 31.8 g/dL (ref 30.0–36.0)
MCV: 88.4 fL (ref 80.0–100.0)
Monocytes Absolute: 0.6 10*3/uL (ref 0.1–1.0)
Monocytes Relative: 6 %
Neutro Abs: 5.2 10*3/uL (ref 1.7–7.7)
Neutrophils Relative %: 57 %
Platelets: 326 10*3/uL (ref 150–400)
RBC: 5.34 MIL/uL — ABNORMAL HIGH (ref 3.87–5.11)
RDW: 13.9 % (ref 11.5–15.5)
WBC: 9.2 10*3/uL (ref 4.0–10.5)
nRBC: 0 % (ref 0.0–0.2)

## 2020-02-03 LAB — BRAIN NATRIURETIC PEPTIDE: B Natriuretic Peptide: 134 pg/mL — ABNORMAL HIGH (ref 0.0–100.0)

## 2020-02-03 LAB — TROPONIN I (HIGH SENSITIVITY): Troponin I (High Sensitivity): 7 ng/L (ref <18)

## 2020-02-03 IMAGING — DX DG CHEST 1V PORT
1 series · 1 of 1 positions shown · non-contrast
Comparison: 09/27/2018

CLINICAL DATA: Worsening shortness of breath for 2 days. Productive
cough for 1 week.

EXAM:
PORTABLE CHEST 1 VIEW

[chest ap]
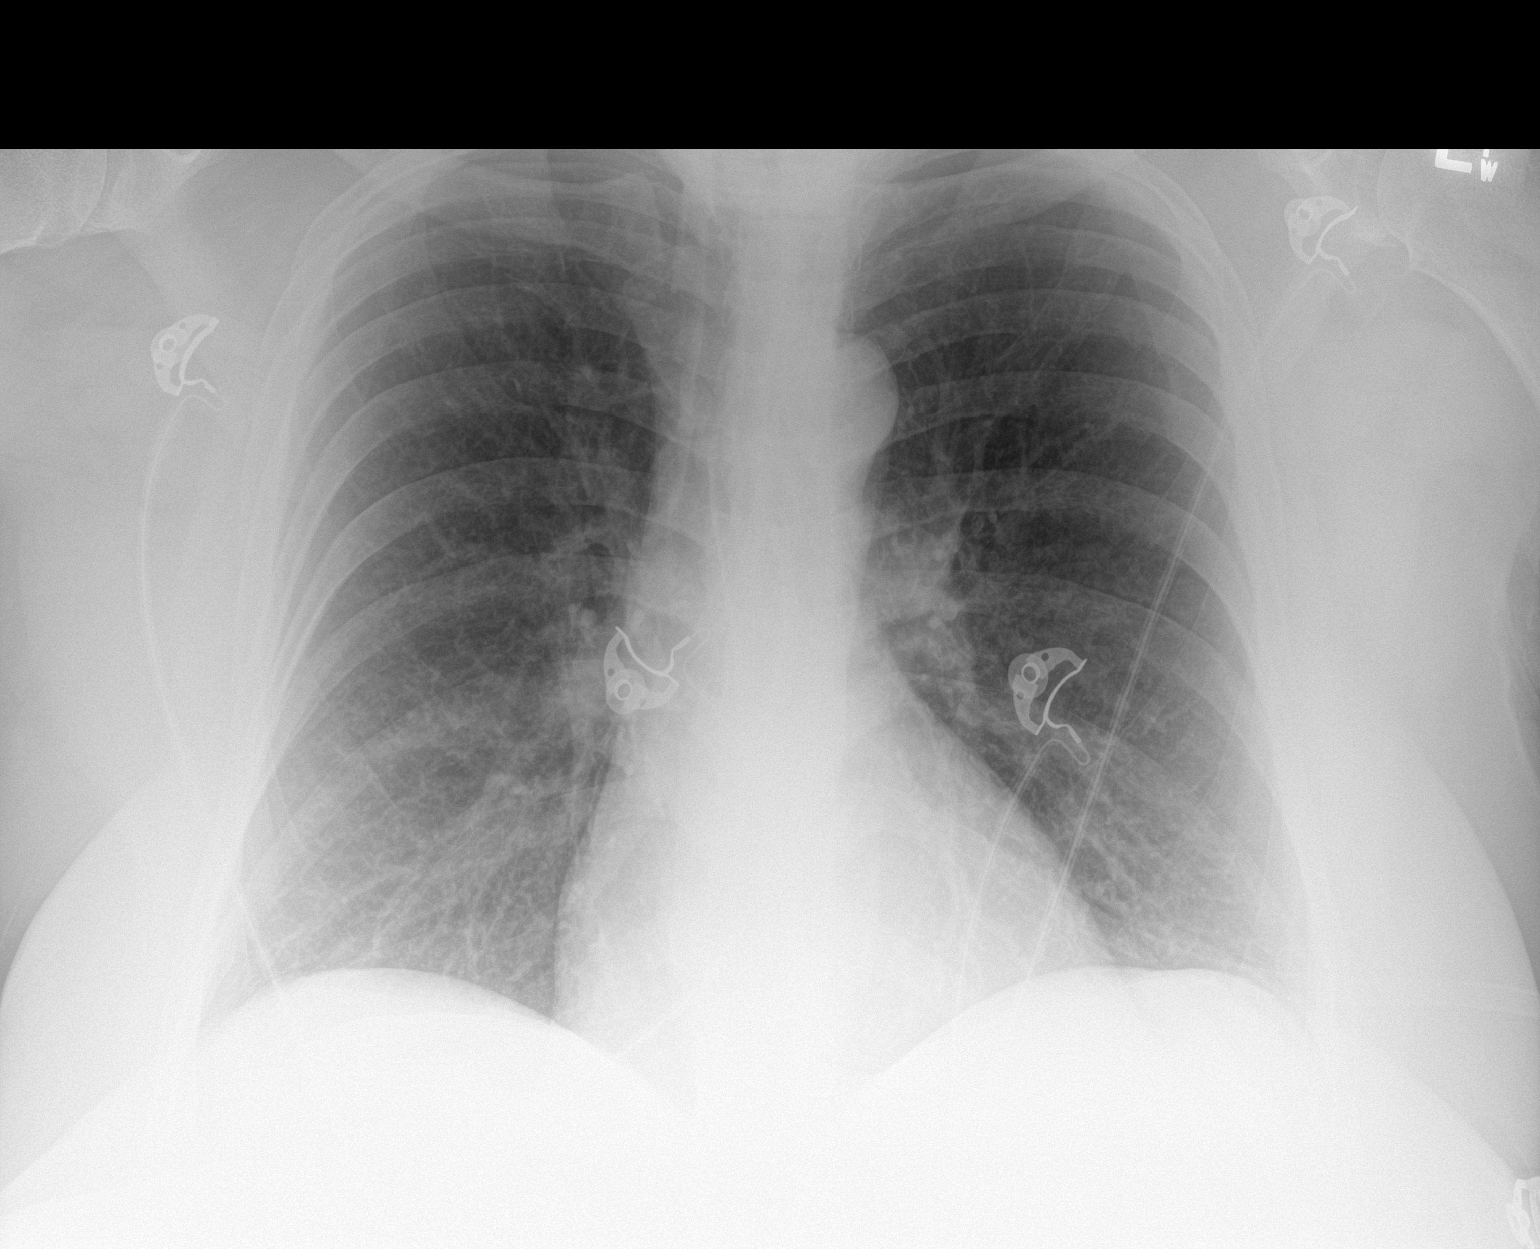

[1 of 1 positions shown; findings below may reference images not displayed]

FINDINGS: The heart size and mediastinal contours are within normal limits.
Both lungs are clear. The visualized skeletal structures are
unremarkable.
IMPRESSION: No active disease.

## 2020-02-03 MED ORDER — PREDNISONE 10 MG PO TABS: ORAL_TABLET | ORAL | 0 refills | Status: AC

## 2020-02-03 MED ORDER — MUPIROCIN 2 % EX OINT: TOPICAL_OINTMENT | CUTANEOUS | 0 refills | Status: DC

## 2020-02-03 MED ORDER — IPRATROPIUM-ALBUTEROL 0.5-2.5 (3) MG/3ML IN SOLN
3.0000 mL | Freq: Once | RESPIRATORY_TRACT | Status: AC
  Administered 2020-02-03: 3 mL via RESPIRATORY_TRACT

## 2020-02-03 MED ORDER — MAGNESIUM SULFATE 2 GM/50ML IV SOLN
2.0000 g | Freq: Once | INTRAVENOUS | Status: AC
  Administered 2020-02-03: 2 g via INTRAVENOUS
  Filled 2020-02-03: qty 50

## 2020-02-03 MED ORDER — IPRATROPIUM-ALBUTEROL 0.5-2.5 (3) MG/3ML IN SOLN
3.0000 mL | Freq: Once | RESPIRATORY_TRACT | Status: AC
  Administered 2020-02-03: 3 mL via RESPIRATORY_TRACT
  Filled 2020-02-03: qty 9

## 2020-02-03 MED ORDER — DOXYCYCLINE HYCLATE 100 MG PO CAPS: 100.0000 mg | ORAL_CAPSULE | Freq: Two times a day (BID) | ORAL | 0 refills | Status: AC

## 2020-02-03 MED ORDER — METHYLPREDNISOLONE SODIUM SUCC 125 MG IJ SOLR
125.0000 mg | Freq: Once | INTRAMUSCULAR | Status: AC
  Administered 2020-02-03: 125 mg via INTRAVENOUS
  Filled 2020-02-03: qty 2

## 2020-02-03 NOTE — ED Notes (Signed)
Pt Samantha [name] about leaving at this time. Refuses to allow this RN to recheck BP due to patient being ready to go. Pt to desk multiple times to yell at staff that she is ready to go despite this RN and Meagan, RN explaining to patient these RN's received a new patient that we were working with. Pt states she is leaving because her daughter is here. This RN discussed BP with patient, states she has not had BP medication today. States she doesn't want to stay and wait for this RN to recheck, pt will not allow this RN to discuss with EDP prior to leaving. Pt given D/C papers after IV removal and left prior to obtained E-sig or repeat trop. Pt ambulatory without difficulty to the lobby with steady gait, able to speak in full and complete sentences without difficulty.

## 2020-02-03 NOTE — ED Triage Notes (Signed)
Arrived by EMS from home for SOB. Reports multiple breathing trx at home with no relief. HX COPD. EMS vitals 98% RA, 136/92, HR 76, RR 22, respiratory wheezing and diminished in all lobes

## 2020-02-03 NOTE — ED Provider Notes (Addendum)
[facility] Emergency Department Provider Note  ____________________________________________   First MD Initiated Contact with Patient 02/03/20 [phone]     (approximate)  I have reviewed the triage vital signs and the nursing notes.   HISTORY  Chief Complaint Shortness of Breath    HPI Samantha [name] is a 54 y.o. female with past medical history of hypertension, diabetes, COPD, here shortness of breath.  The patient arrives with mild respiratory stress.  She reports that since stopping her recent prednisone burst, she has had progressive worsening wheezing and shortness of breath.  She called EMS.  With EMS, she was diffusely wheezy and given breathing treatments.  She states this is improved her symptoms.  She is been using nebulizers at home without significant relief.  She feels like whenever she completes prednisone, she initially feels better than shortly as return of her symptoms.  She notes that weather changes is a possible exacerbation.  She been using a home humidifier.  No fevers, chills, or infection symptoms.  No known coronavirus exposures.        Past Medical History:  Diagnosis Date  . Acute on chronic respiratory failure with hypoxemia (HCC) 08/30/2015  . Asthma   . COPD (chronic obstructive pulmonary disease) (HCC)   . Diabetes mellitus without complication (HCC)   . Hypertension     Patient Active Problem List   Diagnosis Date Noted  . Ventral hernia with obstruction and without gangrene 09/10/2019  . Acute respiratory failure with hypoxemia (HCC) 11/25/2018  . COPD exacerbation (HCC) 02/21/2018  . Acute on chronic respiratory failure (HCC) 10/06/2015  . Acute on chronic respiratory failure with hypoxemia (HCC) 08/30/2015    Past Surgical History:  Procedure Laterality Date  . none      Prior to Admission medications   Medication Sig Start Date End Date Taking? Authorizing Provider  albuterol (PROVENTIL HFA) 108 (90 Base)  MCG/ACT inhaler Inhale 2 puffs into the lungs every 4 (four) hours as needed for wheezing or shortness of breath. 11/16/19   Sharman Cheek, MD  albuterol (PROVENTIL HFA;VENTOLIN HFA) 108 [phone] Base) MCG/ACT inhaler Inhale 2 puffs into the lungs every 6 (six) hours as needed for wheezing or shortness of breath. 08/01/18   Shaune Pollack, MD  budesonide-formoterol [facility]) 160-4.5 MCG/ACT inhaler Inhale 2 puffs into the lungs 2 (two) times daily. 11/16/19   Sharman Cheek, MD  budesonide-formoterol [facility]) 160-4.5 MCG/ACT inhaler Inhale 2 puffs into the lungs 2 (two) times daily. 01/22/20   Nita Sickle, MD  doxycycline (VIBRAMYCIN) 100 MG capsule Take 1 capsule (100 mg total) by mouth 2 (two) times daily for 7 days. 02/03/20 02/10/20  Shaune Pollack, MD  gabapentin (NEURONTIN) 300 MG capsule Take 300 mg by mouth 3 (three) times daily.     [provider]  ipratropium-albuterol (DUONEB) 0.5-2.5 (3) MG/3ML SOLN Take 3 mLs by nebulization every 6 (six) hours as needed (shortness of breath).    [provider]  losartan-hydrochlorothiazide (HYZAAR) 50-12.5 MG tablet Take 1 tablet by mouth daily. 02/22/16   [provider]  metFORMIN (GLUCOPHAGE) 850 MG tablet Take 850 mg by mouth daily.     [provider]  montelukast (SINGULAIR) 10 MG tablet Take 10 mg by mouth at bedtime.     [provider]  mupirocin ointment (BACTROBAN) 2 % Apply to affected area on upper lip 3 times daily for 7 days 02/03/20 02/02/21  Shaune Pollack, MD  omeprazole (PRILOSEC) 20 MG capsule Take 20 mg by  mouth daily.  05/05/19   [provider]  predniSONE (DELTASONE) 10 MG tablet Take 6 tablets (60 mg total) by mouth daily for 2 days, THEN 4 tablets (40 mg total) daily for 3 days, THEN 2 tablets (20 mg total) daily for 3 days, THEN 1 tablet (10 mg total) daily for 3 days, THEN 0.5 tablets (5 mg total) daily for 3 days. 02/03/20 02/17/20  Shaune Pollack, MD  tiotropium (SPIRIVA)  18 MCG inhalation capsule Place 18 mcg into inhaler and inhale daily.    [provider]    Allergies Percocet [oxycodone-acetaminophen]  Family History  Problem Relation Age of Onset  . Asthma Mother   . Diabetes Mother   . Hypertension Mother   . Asthma Sister   . Heart attack Father     Social History Social History   Tobacco Use  . Smoking status: Current Every Day Smoker    Packs/day: 0.10    Years: 23.00    Pack years: 2.30    Types: Cigarettes  . Smokeless tobacco: Never Used  Substance Use Topics  . Alcohol use: Yes    Alcohol/week: 1.0 standard drinks    Types: 1 Cans of beer per week  . Drug use: Yes    Types: Cocaine    Comment: x 1 week ago    Review of Systems  Review of Systems  Constitutional: Positive for fatigue. Negative for fever.  HENT: Negative for congestion and sore throat.   Eyes: Negative for visual disturbance.  Respiratory: Positive for cough, shortness of breath and wheezing.   Cardiovascular: Negative for chest pain.  Gastrointestinal: Negative for abdominal pain, diarrhea, nausea and vomiting.  Genitourinary: Negative for flank pain.  Musculoskeletal: Negative for back pain and neck pain.  Skin: Negative for rash and wound.  Neurological: Negative for weakness.  All other systems reviewed and are negative.    ____________________________________________  PHYSICAL EXAM:      VITAL SIGNS: ED Triage Vitals  Enc Vitals Group     BP 02/03/20 0907 (!) 185/103     Pulse Rate 02/03/20 0907 70     Resp 02/03/20 0907 (!) 22     Temp 02/03/20 0907 98.7 F (37.1 C)     Temp Source 02/03/20 0907 Oral     SpO2 02/03/20 0907 96 %     Weight 02/03/20 0908 265 lb (120.2 kg)     Height 02/03/20 0908 5\' 7"  (1.702 m)     Head Circumference --      Peak Flow --      Pain Score 02/03/20 0907 10     Pain Loc --      Pain Edu? --      Excl. in GC? --      Physical Exam Vitals and nursing note reviewed.  Constitutional:       General: She is not in acute distress.    Appearance: She is well-developed.  HENT:     Head: Normocephalic and atraumatic.     Comments: Hoarse voice Eyes:     Conjunctiva/sclera: Conjunctivae normal.  Cardiovascular:     Rate and Rhythm: Regular rhythm. Tachycardia present.     Heart sounds: Normal heart sounds. No murmur. No friction rub.  Pulmonary:     Effort: Pulmonary effort is normal. Tachypnea present. No respiratory distress.     Breath sounds: Decreased breath sounds and wheezing present. No rales.  Abdominal:     General: There is no distension.  Palpations: Abdomen is soft.     Tenderness: There is no abdominal tenderness.  Musculoskeletal:     Cervical back: Neck supple.  Skin:    General: Skin is warm.     Capillary Refill: Capillary refill takes less than 2 seconds.  Neurological:     Mental Status: She is alert and oriented to person, place, and time.     Motor: No abnormal muscle tone.      ____________________________________________   LABS (all labs ordered are listed, but only abnormal results are displayed)  Labs Reviewed  CBC WITH DIFFERENTIAL/PLATELET - Abnormal; Notable for the following components:      Result Value   RBC 5.34 (*)    HCT 47.2 (*)    All other components within normal limits  COMPREHENSIVE METABOLIC PANEL - Abnormal; Notable for the following components:   Glucose, Bld 128 (*)    BUN 22 (*)    Creatinine, Ser 1.37 (*)    GFR calc non Af Amer 43 (*)    GFR calc Af Amer 50 (*)    All other components within normal limits  BRAIN NATRIURETIC PEPTIDE - Abnormal; Notable for the following components:   B Natriuretic Peptide 134.0 (*)    All other components within normal limits  TROPONIN I (HIGH SENSITIVITY)  TROPONIN I (HIGH SENSITIVITY)    ____________________________________________  EKG: Normal sinus rhythm, ventricular rate 68.  PR 132, QRS 82, QTc 452.  LVH.  No acute ST elevations or depressions.  No ischemia or  infarct ________________________________________  RADIOLOGY All imaging, including plain films, CT scans, and ultrasounds, independently reviewed by me, and interpretations confirmed via formal radiology reads.  ED MD interpretation:   Chest x-ray: Mild increased bronchitic changes  Official radiology report(s): DG Chest Portable 1 View  Result Date: 02/03/2020 CLINICAL DATA:  Shortness of breath. History of COPD. EXAM: PORTABLE CHEST 1 VIEW COMPARISON:  01/22/2020 FINDINGS: The cardiomediastinal silhouette is unchanged with normal heart size. Peribronchial thickening and accentuation of the interstitial markings are stable to mildly increased compared to the prior study. There is minimal scarring or atelectasis in the left lung base. No pleural effusion or pneumothorax is identified. No acute osseous abnormality is seen. IMPRESSION: Stable to mildly increased bronchitic changes. Electronically Signed   By: Sebastian Ache M.D.   On: 02/03/2020 09:38    ____________________________________________  PROCEDURES   Procedure(s) performed (including Critical Care):  .Critical Care Performed by: Shaune Pollack, MD Authorized by: Shaune Pollack, MD   Critical care provider statement:    Critical care time (minutes):  35   Critical care time was exclusive of:  Separately billable procedures and treating other patients and teaching time   Critical care was necessary to treat or prevent imminent or life-threatening deterioration of the following conditions:  Cardiac failure, circulatory failure and respiratory failure   Critical care was time spent personally by me on the following activities:  Development of treatment plan with patient or surrogate, discussions with consultants, evaluation of patient's response to treatment, examination of patient, obtaining history from patient or surrogate, ordering and performing treatments and interventions, ordering and review of laboratory studies, ordering  and review of radiographic studies, pulse oximetry, re-evaluation of patient's condition and review of old charts   I assumed direction of critical care for this patient from another provider in my specialty: no      ____________________________________________  INITIAL IMPRESSION / MDM / ASSESSMENT AND PLAN / ED COURSE  As part of my medical  decision making, I reviewed the following data within the electronic MEDICAL RECORD NUMBER Nursing notes reviewed and incorporated, Old chart reviewed, Notes from prior ED visits, and [place] Controlled Substance Database       *Chelcee A Mccarron was evaluated in Emergency Department on 02/03/2020 for the symptoms described in the history of present illness. She was evaluated in the context of the global COVID-19 pandemic, which necessitated consideration that the patient might be at risk for infection with the SARS-CoV-2 virus that causes COVID-19. Institutional protocols and algorithms that pertain to the evaluation of patients at risk for COVID-19 are in a state of rapid change based on information released by regulatory bodies including the CDC and federal and state organizations. These policies and algorithms were followed during the patient's care in the ED.  Some ED evaluations and interventions may be delayed as a result of limited staffing during the pandemic.*     Medical Decision Making: 54 year old female here with cough, wheezing, shortness of breath.  History of COPD with recurrent exacerbations.  She did have diffuse wheezing on arrival, was given 3 duo nebs, IV steroids, IV magnesium, with excellent improvement.  She was monitored.  Shortly thereafter, she is requesting to leave but she has no further wheezing and is ambulatory without hypoxia or difficulty.  She has a well-known history of COPD with good response to bronchodilators, and I feel this is reasonable.  Of note, she has recently had several bursts of steroid with immediate return shortly  thereafter.  Will prolong a more protracted taper of steroids, and we discussed the risks and benefits of this.  She also has some mild sputum production so will cover with doxycycline, the no evidence of sepsis or significant pneumonia on chest x-ray.  Discharged with outpatient pulmonology and PCP follow-up.  Of note, she does have mild superficial irritation of her upper lip due to frequent nebulizers.  Will have her cover this petroleum jelly or mupirocin ointment.  ADDENDUM: BP elevated on d/c.  Patient is adamant this is her baseline and she refuses to stay.  She has no chest pain, shortness of breath, and she did not take her blood pressure medications.  She does have history of poorly controlled hypertension.  No signs of hypertensive emergency at this time. ____________________________________________  FINAL CLINICAL IMPRESSION(S) / ED DIAGNOSES  Final diagnoses:  COPD exacerbation (HCC)     MEDICATIONS GIVEN DURING THIS VISIT:  Medications  methylPREDNISolone sodium succinate (SOLU-MEDROL) 125 mg/2 mL injection 125 mg (125 mg Intravenous Given 02/03/20 0921)  ipratropium-albuterol (DUONEB) 0.5-2.5 (3) MG/3ML nebulizer solution 3 mL (3 mLs Nebulization Given 02/03/20 0924)  ipratropium-albuterol (DUONEB) 0.5-2.5 (3) MG/3ML nebulizer solution 3 mL (3 mLs Nebulization Given 02/03/20 0924)  ipratropium-albuterol (DUONEB) 0.5-2.5 (3) MG/3ML nebulizer solution 3 mL (3 mLs Nebulization Given 02/03/20 0924)  magnesium sulfate IVPB 2 g 50 mL (2 g Intravenous New Bag/Given 02/03/20 1610)     ED Discharge Orders         Ordered    predniSONE (DELTASONE) 10 MG tablet     02/03/20 1050    mupirocin ointment (BACTROBAN) 2 %     02/03/20 1051    doxycycline (VIBRAMYCIN) 100 MG capsule  2 times daily     02/03/20 1053           Note:  This document was prepared using Dragon voice recognition software and may include unintentional dictation errors.   Shaune Pollack, MD 02/03/20 1055     Shaune Pollack,  MD 02/03/20 1120

## 2020-02-03 NOTE — Discharge Instructions (Addendum)
Apply the antibiotic ointment or over-the-counter Eucerin/petroleum jelly to the upper lip to protect it for your nebulizers  Continue using your albuterol at least every 2-4 hours for the next 24 to 48 hours  Take the prolonged course of steroids as prescribed

## 2020-02-03 NOTE — ED Notes (Signed)
This RN and Meagan, RN at bedside, pt provided with warm blankets per her request. Pt finished breathing tx. Call bell within reach. Pt current speaking on phone with family member. Explained and apologized for delay in MD coming to room. Pt denies further needs.

## 2020-02-09 DIAGNOSIS — J449 Chronic obstructive pulmonary disease, unspecified: Secondary | ICD-10-CM | POA: Diagnosis not present

## 2020-02-11 DIAGNOSIS — J449 Chronic obstructive pulmonary disease, unspecified: Secondary | ICD-10-CM | POA: Diagnosis not present

## 2020-02-14 DIAGNOSIS — J449 Chronic obstructive pulmonary disease, unspecified: Secondary | ICD-10-CM | POA: Diagnosis not present

## 2020-02-14 DIAGNOSIS — J9622 Acute and chronic respiratory failure with hypercapnia: Secondary | ICD-10-CM | POA: Diagnosis not present

## 2020-02-14 DIAGNOSIS — J9621 Acute and chronic respiratory failure with hypoxia: Secondary | ICD-10-CM | POA: Diagnosis not present

## 2020-02-15 DIAGNOSIS — G4733 Obstructive sleep apnea (adult) (pediatric): Secondary | ICD-10-CM | POA: Diagnosis not present

## 2020-02-15 DIAGNOSIS — J9621 Acute and chronic respiratory failure with hypoxia: Secondary | ICD-10-CM | POA: Diagnosis not present

## 2020-02-15 DIAGNOSIS — J449 Chronic obstructive pulmonary disease, unspecified: Secondary | ICD-10-CM | POA: Diagnosis not present

## 2020-02-15 DIAGNOSIS — J45998 Other asthma: Secondary | ICD-10-CM | POA: Diagnosis not present

## 2020-02-25 ENCOUNTER — Other Ambulatory Visit: Payer: Self-pay

## 2020-02-25 ENCOUNTER — Emergency Department: Payer: Medicare HMO

## 2020-02-25 ENCOUNTER — Emergency Department
Admission: EM | Admit: 2020-02-25 | Discharge: 2020-02-25 | Disposition: A | Discharge status: Home / Self Care | Payer: Medicare HMO | Attending: Emergency Medicine | Admitting: Emergency Medicine

## 2020-02-25 ENCOUNTER — Encounter: Payer: Self-pay | Admitting: Emergency Medicine

## 2020-02-25 DIAGNOSIS — E119 Type 2 diabetes mellitus without complications: Secondary | ICD-10-CM | POA: Insufficient documentation

## 2020-02-25 DIAGNOSIS — J441 Chronic obstructive pulmonary disease with (acute) exacerbation: Secondary | ICD-10-CM

## 2020-02-25 DIAGNOSIS — Z79899 Other long term (current) drug therapy: Secondary | ICD-10-CM | POA: Insufficient documentation

## 2020-02-25 DIAGNOSIS — Z7984 Long term (current) use of oral hypoglycemic drugs: Secondary | ICD-10-CM | POA: Insufficient documentation

## 2020-02-25 DIAGNOSIS — I10 Essential (primary) hypertension: Secondary | ICD-10-CM | POA: Insufficient documentation

## 2020-02-25 DIAGNOSIS — F17210 Nicotine dependence, cigarettes, uncomplicated: Secondary | ICD-10-CM | POA: Insufficient documentation

## 2020-02-25 DIAGNOSIS — J9611 Chronic respiratory failure with hypoxia: Secondary | ICD-10-CM | POA: Diagnosis not present

## 2020-02-25 DIAGNOSIS — R0602 Shortness of breath: Secondary | ICD-10-CM | POA: Diagnosis not present

## 2020-02-25 DIAGNOSIS — R05 Cough: Secondary | ICD-10-CM | POA: Diagnosis not present

## 2020-02-25 DIAGNOSIS — J9621 Acute and chronic respiratory failure with hypoxia: Secondary | ICD-10-CM | POA: Diagnosis not present

## 2020-02-25 LAB — CBC WITH DIFFERENTIAL/PLATELET
Abs Immature Granulocytes: 0.03 10*3/uL (ref 0.00–0.07)
Basophils Absolute: 0 10*3/uL (ref 0.0–0.1)
Basophils Relative: 1 %
Eosinophils Absolute: 0.2 10*3/uL (ref 0.0–0.5)
Eosinophils Relative: 3 %
HCT: 44.4 % (ref 36.0–46.0)
Hemoglobin: 14 g/dL (ref 12.0–15.0)
Immature Granulocytes: 0 %
Lymphocytes Relative: 39 %
Lymphs Abs: 3.3 10*3/uL (ref 0.7–4.0)
MCH: 27.9 pg (ref 26.0–34.0)
MCHC: 31.5 g/dL (ref 30.0–36.0)
MCV: 88.4 fL (ref 80.0–100.0)
Monocytes Absolute: 0.6 10*3/uL (ref 0.1–1.0)
Monocytes Relative: 7 %
Neutro Abs: 4.3 10*3/uL (ref 1.7–7.7)
Neutrophils Relative %: 50 %
Platelets: 323 10*3/uL (ref 150–400)
RBC: 5.02 MIL/uL (ref 3.87–5.11)
RDW: 13.7 % (ref 11.5–15.5)
WBC: 8.5 10*3/uL (ref 4.0–10.5)
nRBC: 0 % (ref 0.0–0.2)

## 2020-02-25 LAB — TROPONIN I (HIGH SENSITIVITY): Troponin I (High Sensitivity): 8 ng/L (ref <18)

## 2020-02-25 LAB — BASIC METABOLIC PANEL
Anion gap: 8 (ref 5–15)
BUN: 31 mg/dL — ABNORMAL HIGH (ref 6–20)
CO2: 24 mmol/L (ref 22–32)
Calcium: 8.9 mg/dL (ref 8.9–10.3)
Chloride: 105 mmol/L (ref 98–111)
Creatinine, Ser: 1.47 mg/dL — ABNORMAL HIGH (ref 0.44–1.00)
GFR calc Af Amer: 46 mL/min — ABNORMAL LOW (ref >60)
GFR calc non Af Amer: 40 mL/min — ABNORMAL LOW (ref >60)
Glucose, Bld: 164 mg/dL — ABNORMAL HIGH (ref 70–99)
Potassium: 4.1 mmol/L (ref 3.5–5.1)
Sodium: 137 mmol/L (ref 135–145)

## 2020-02-25 LAB — BRAIN NATRIURETIC PEPTIDE: B Natriuretic Peptide: 44 pg/mL (ref 0.0–100.0)

## 2020-02-25 MED ORDER — MAGNESIUM SULFATE 2 GM/50ML IV SOLN
2.0000 g | INTRAVENOUS | Status: AC
  Administered 2020-02-25: 2 g via INTRAVENOUS
  Filled 2020-02-25: qty 50

## 2020-02-25 MED ORDER — AMOXICILLIN 875 MG PO TABS: 875.0000 mg | ORAL_TABLET | Freq: Two times a day (BID) | ORAL | 0 refills | Status: DC

## 2020-02-25 MED ORDER — ALBUTEROL SULFATE (2.5 MG/3ML) 0.083% IN NEBU
5.0000 mg | INHALATION_SOLUTION | Freq: Once | RESPIRATORY_TRACT | Status: AC
  Administered 2020-02-25: 5 mg via RESPIRATORY_TRACT
  Filled 2020-02-25: qty 6

## 2020-02-25 MED ORDER — GUAIFENESIN 100 MG/5ML PO SOLN: 5.0000 mL | ORAL | 0 refills | Status: AC | PRN

## 2020-02-25 MED ORDER — PREDNISONE 10 MG PO TABS: ORAL_TABLET | ORAL | 0 refills | Status: AC

## 2020-02-25 MED ORDER — IPRATROPIUM-ALBUTEROL 0.5-2.5 (3) MG/3ML IN SOLN
3.0000 mL | Freq: Once | RESPIRATORY_TRACT | Status: AC
  Administered 2020-02-25: 3 mL via RESPIRATORY_TRACT
  Filled 2020-02-25: qty 3

## 2020-02-25 MED ORDER — METHYLPREDNISOLONE SODIUM SUCC 125 MG IJ SOLR
125.0000 mg | Freq: Once | INTRAMUSCULAR | Status: AC
  Administered 2020-02-25: 125 mg via INTRAVENOUS
  Filled 2020-02-25: qty 2

## 2020-02-25 MED ORDER — LOSARTAN POTASSIUM-HCTZ 50-12.5 MG PO TABS: 1.0000 | ORAL_TABLET | Freq: Every day | ORAL | 0 refills | Status: AC

## 2020-02-25 NOTE — ED Notes (Signed)
Pt respirations now even and unlabored. Pt removed 2L nasal cannula by self to trial tolerating room air. Pt oxygen saturation 96% on room air at this time. Pt denies any further needs at this time.

## 2020-02-25 NOTE — ED Triage Notes (Signed)
Pt presents from home via [facility] with c/o shortness of breath. Pt has hx of copd. Pt chronically on 2L. Pt oxygen 96% on 2L, but respirations labored and audible wheezing heard upon arrival. Pt unable to talk in complete sentences. Pt alert at this time. MD Scotty Court at bedside with this RN.

## 2020-02-25 NOTE — ED Notes (Signed)
E-signature not working at this time. Pt verbalized understanding of D/C instructions, prescriptions and follow up care with no further questions at this time. Pt in NAD and wheeled to lobby at time of D/C. Pt given taxi voucher, charge heather, rn and Pierre Part, first nurse aware.

## 2020-02-25 NOTE — ED Provider Notes (Signed)
[facility] Emergency Department Provider Note  ____________________________________________  Time seen: Approximately 8:59 AM  I have reviewed the triage vital signs and the nursing notes.   HISTORY  Chief Complaint Shortness of Breath    HPI Samantha [name] is a 54 y.o. female With a history of COPD, diabetes, hypertension, chronic respiratory failure on 2 L nasal cannula at home who comes the ED complaining of worsening shortness of breath for the past 2 days, gradual onset, constant, not alleviated by her inhalers.  Feels like a COPD exacerbation.  Denies chest pain.  Denies fevers chills body aches or sick contacts.  Has some nonproductive cough.      Past Medical History:  Diagnosis Date  . Acute on chronic respiratory failure with hypoxemia ([place]) 08/30/2015  . Asthma   . COPD (chronic obstructive pulmonary disease) ([place])   . Diabetes mellitus without complication ([place])   . Hypertension      Patient Active Problem List   Diagnosis Date Noted  . Ventral hernia with obstruction and without gangrene 09/10/2019  . Acute respiratory failure with hypoxemia ([place]) 11/25/2018  . COPD exacerbation ([place]) 02/21/2018  . Acute on chronic respiratory failure ([place]) 10/06/2015  . Acute on chronic respiratory failure with hypoxemia ([place]) 08/30/2015     Past Surgical History:  Procedure Laterality Date  . none       Prior to Admission medications   Medication Sig Start Date End Date Taking? Authorizing Provider  albuterol (PROVENTIL HFA) 108 (90 Base) MCG/ACT inhaler Inhale 2 puffs into the lungs every 4 (four) hours as needed for wheezing or shortness of breath. 11/16/19   Carrie Mew, MD  albuterol (PROVENTIL HFA;VENTOLIN HFA) 108 [phone] Base) MCG/ACT inhaler Inhale 2 puffs into the lungs every 6 (six) hours as needed for wheezing or shortness of breath. 08/01/18   Demetrios Loll, MD  amoxicillin (AMOXIL) 875 MG tablet Take 1 tablet (875 mg total) by mouth  2 (two) times daily. 02/25/20   Carrie Mew, MD  budesonide-formoterol [facility]) 160-4.5 MCG/ACT inhaler Inhale 2 puffs into the lungs 2 (two) times daily. 11/16/19   Carrie Mew, MD  budesonide-formoterol [facility]) 160-4.5 MCG/ACT inhaler Inhale 2 puffs into the lungs 2 (two) times daily. 01/22/20   Rudene Re, MD  gabapentin (NEURONTIN) 300 MG capsule Take 300 mg by mouth 3 (three) times daily.     [provider]  guaiFENesin (ROBITUSSIN) 100 MG/5ML SOLN Take 5 mLs (100 mg total) by mouth every 4 (four) hours as needed for cough or to loosen phlegm. 02/25/20   Carrie Mew, MD  ipratropium-albuterol (DUONEB) 0.5-2.5 (3) MG/3ML SOLN Take 3 mLs by nebulization every 6 (six) hours as needed (shortness of breath).    [provider]  losartan-hydrochlorothiazide (HYZAAR) 50-12.5 MG tablet Take 1 tablet by mouth daily. 02/25/20   Carrie Mew, MD  metFORMIN (GLUCOPHAGE) 850 MG tablet Take 850 mg by mouth daily.     [provider]  montelukast (SINGULAIR) 10 MG tablet Take 10 mg by mouth at bedtime.     [provider]  mupirocin ointment (BACTROBAN) 2 % Apply to affected area on upper lip 3 times daily for 7 days 02/03/20 02/02/21  Duffy Bruce, MD  omeprazole (PRILOSEC) 20 MG capsule Take 20 mg by mouth daily.  05/05/19   [provider]  predniSONE (DELTASONE) 10 MG tablet Take 5 tablets (50 mg total) by mouth daily for 3 days, THEN 4 tablets (40 mg total) daily for 3 days, THEN  3 tablets (30 mg total) daily for 3 days, THEN 2 tablets (20 mg total) daily for 3 days, THEN 1 tablet (10 mg total) daily for 3 days. 02/25/20 03/11/20  Sharman Cheek, MD  tiotropium (SPIRIVA) 18 MCG inhalation capsule Place 18 mcg into inhaler and inhale daily.    [provider]     Allergies Percocet [oxycodone-acetaminophen]   Family History  Problem Relation Age of Onset  . Asthma Mother   . Diabetes Mother   . Hypertension  Mother   . Asthma Sister   . Heart attack Father     Social History Social History   Tobacco Use  . Smoking status: Current Every Day Smoker    Packs/day: 0.10    Years: 23.00    Pack years: 2.30    Types: Cigarettes  . Smokeless tobacco: Never Used  Substance Use Topics  . Alcohol use: Yes    Alcohol/week: 1.0 standard drinks    Types: 1 Cans of beer per week  . Drug use: Yes    Types: Cocaine    Comment: x 1 week ago    Review of Systems  Constitutional:   No fever or chills.  ENT:   No sore throat. No rhinorrhea. Cardiovascular:   No chest pain or syncope. Respiratory:   Positive shortness of breath and nonproductive cough. Gastrointestinal:   Negative for abdominal pain, vomiting and diarrhea.  Musculoskeletal:   Negative for focal pain or swelling All other systems reviewed and are negative except as documented above in ROS and HPI.  ____________________________________________   PHYSICAL EXAM:  VITAL SIGNS: ED Triage Vitals  Enc Vitals Group     BP 02/25/20 0753 (!) 154/90     Pulse Rate 02/25/20 0753 100     Resp 02/25/20 0753 12     Temp 02/25/20 0808 98.1 F (36.7 C)     Temp Source 02/25/20 0808 Axillary     SpO2 02/25/20 0753 96 %     Weight 02/25/20 0808 265 lb (120.2 kg)     Height 02/25/20 0808 5\' 7"  (1.702 m)     Head Circumference --      Peak Flow --      Pain Score 02/25/20 0808 0     Pain Loc --      Pain Edu? --      Excl. in GC? --     Vital signs reviewed, nursing assessments reviewed.   Constitutional:   Alert and oriented. Non-toxic appearance. Eyes:   Conjunctivae are normal. EOMI. PERRL. ENT      Head:   Normocephalic and atraumatic.      Nose:   Wearing a mask.      Mouth/Throat:   Wearing a mask.      Neck:   No meningismus. Full ROM. Hematological/Lymphatic/Immunilogical:   No cervical lymphadenopathy. Cardiovascular:   RRR. Symmetric bilateral radial and DP pulses.  No murmurs. Cap refill less than 2  seconds. Respiratory: Accessory muscle use.  Diminished air entry in all lung fields.  Prolonged expiratory phase. Gastrointestinal:   Soft and nontender. Non distended. There is no CVA tenderness.  No rebound, rigidity, or guarding. Musculoskeletal:   Normal range of motion in all extremities. No joint effusions.  No lower extremity tenderness.  No edema. Neurologic:   Normal speech and language.  Motor grossly intact. No acute focal neurologic deficits are appreciated.  Skin:    Skin is warm, dry and intact. No rash noted.  No petechiae, purpura,  or bullae.  ____________________________________________    LABS (pertinent positives/negatives) (all labs ordered are listed, but only abnormal results are displayed) Labs Reviewed  BASIC METABOLIC PANEL - Abnormal; Notable for the following components:      Result Value   Glucose, Bld 164 (*)    BUN 31 (*)    Creatinine, Ser 1.47 (*)    GFR calc non Af Amer 40 (*)    GFR calc Af Amer 46 (*)    All other components within normal limits  CBC WITH DIFFERENTIAL/PLATELET  BRAIN NATRIURETIC PEPTIDE  TROPONIN I (HIGH SENSITIVITY)  TROPONIN I (HIGH SENSITIVITY)   ____________________________________________   EKG  Interpreted by me Sinus tachycardia rate 103, normal axis intervals QRS ST segments and T waves.  ____________________________________________    RADIOLOGY  DG Chest Portable 1 View  Result Date: 02/25/2020 CLINICAL DATA:  Dyspnea, cough. Additional history provided: Shortness of breath, history of COPD, chronically on supplemental oxygen EXAM: PORTABLE CHEST 1 VIEW COMPARISON:  Chest radiographs 02/03/2020 and earlier FINDINGS: Heart size within normal limits. No significant interval change in appearance of the lungs as compared to prior examination 02/03/2020. As before, there is peribronchial thickening with accentuation of the interstitial lung markings. No evidence of pleural effusion or pneumothorax. No acute bony  abnormality. IMPRESSION: No significant interval change as compared to chest radiograph 02/03/2020. Bronchitic changes with chronically prominent interstitial lung markings. Electronically Signed   By: Jackey Loge DO   On: 02/25/2020 08:35    ____________________________________________   PROCEDURES Procedures  ____________________________________________  DIFFERENTIAL DIAGNOSIS   COPD exacerbation, pulmonary edema, pleural effusion, pneumonia.  Doubt ACS PE dissection  CLINICAL IMPRESSION / ASSESSMENT AND PLAN / ED COURSE  Medications ordered in the ED: Medications  ipratropium-albuterol (DUONEB) 0.5-2.5 (3) MG/3ML nebulizer solution 3 mL (3 mLs Nebulization Given 02/25/20 0805)  albuterol (PROVENTIL) (2.5 MG/3ML) 0.083% nebulizer solution 5 mg (5 mg Nebulization Given 02/25/20 0805)  methylPREDNISolone sodium succinate (SOLU-MEDROL) 125 mg/2 mL injection 125 mg (125 mg Intravenous Given 02/25/20 0801)  magnesium sulfate IVPB 2 g 50 mL (0 g Intravenous Stopped 02/25/20 1018)    Pertinent labs & imaging results that were available during my care of the patient were reviewed by me and considered in my medical decision making (see chart for details).  Samantha [name] was evaluated in Emergency Department on 02/25/2020 for the symptoms described in the history of present illness. She was evaluated in the context of the global COVID-19 pandemic, which necessitated consideration that the patient might be at risk for infection with the SARS-CoV-2 virus that causes COVID-19. Institutional protocols and algorithms that pertain to the evaluation of patients at risk for COVID-19 are in a state of rapid change based on information released by regulatory bodies including the CDC and federal and state organizations. These policies and algorithms were followed during the patient's care in the ED.   Patient presents with diminished air movement, shortness of breath, cough.  High suspicion for COPD  exacerbation.  Will obtain labs, chest x-ray.  Give bronchodilators, Solu-Medrol, IV magnesium.  Clinical Course as of Feb 24 1034  Tue Feb 25, 2020  1031 Work-up negative.  Patient's lung exam is improved.  She states that she feels back to normal.  BNP, troponin, chest x-ray all reassuring.  Vital signs reassuring.  She is now oxygen saturation of 96% on room air.  Stable for discharge home.   [PS]    Clinical Course User Index [PS] Sharman Cheek, MD  ____________________________________________   FINAL CLINICAL IMPRESSION(S) / ED DIAGNOSES    Final diagnoses:  COPD exacerbation (HCC)  Chronic respiratory failure with hypoxia [facility])     ED Discharge Orders         Ordered    losartan-hydrochlorothiazide (HYZAAR) 50-12.5 MG tablet  Daily     02/25/20 1034    amoxicillin (AMOXIL) 875 MG tablet  2 times daily     02/25/20 1034    predniSONE (DELTASONE) 10 MG tablet     02/25/20 1034    guaiFENesin (ROBITUSSIN) 100 MG/5ML SOLN  Every 4 hours PRN     02/25/20 1034          Portions of this note were generated with dragon dictation software. Dictation errors may occur despite best attempts at proofreading.   Sharman Cheek, MD 02/25/20 [phone]

## 2020-03-10 DIAGNOSIS — J449 Chronic obstructive pulmonary disease, unspecified: Secondary | ICD-10-CM | POA: Diagnosis not present

## 2020-03-13 DIAGNOSIS — J449 Chronic obstructive pulmonary disease, unspecified: Secondary | ICD-10-CM | POA: Diagnosis not present

## 2020-03-16 DIAGNOSIS — J9622 Acute and chronic respiratory failure with hypercapnia: Secondary | ICD-10-CM | POA: Diagnosis not present

## 2020-03-16 DIAGNOSIS — J9621 Acute and chronic respiratory failure with hypoxia: Secondary | ICD-10-CM | POA: Diagnosis not present

## 2020-03-16 DIAGNOSIS — J449 Chronic obstructive pulmonary disease, unspecified: Secondary | ICD-10-CM | POA: Diagnosis not present

## 2020-03-17 DIAGNOSIS — J449 Chronic obstructive pulmonary disease, unspecified: Secondary | ICD-10-CM | POA: Diagnosis not present

## 2020-03-17 DIAGNOSIS — G4733 Obstructive sleep apnea (adult) (pediatric): Secondary | ICD-10-CM | POA: Diagnosis not present

## 2020-03-17 DIAGNOSIS — J45998 Other asthma: Secondary | ICD-10-CM | POA: Diagnosis not present

## 2020-03-17 DIAGNOSIS — J9621 Acute and chronic respiratory failure with hypoxia: Secondary | ICD-10-CM | POA: Diagnosis not present

## 2020-03-26 IMAGING — DX DG CHEST 1V PORT
1 series · 1 of 1 positions shown · non-contrast
Comparison: Portable film earlier in the day.

CLINICAL DATA: Status post ET tube and OG placement.

EXAM:
PORTABLE CHEST 1 VIEW

[chest ap]
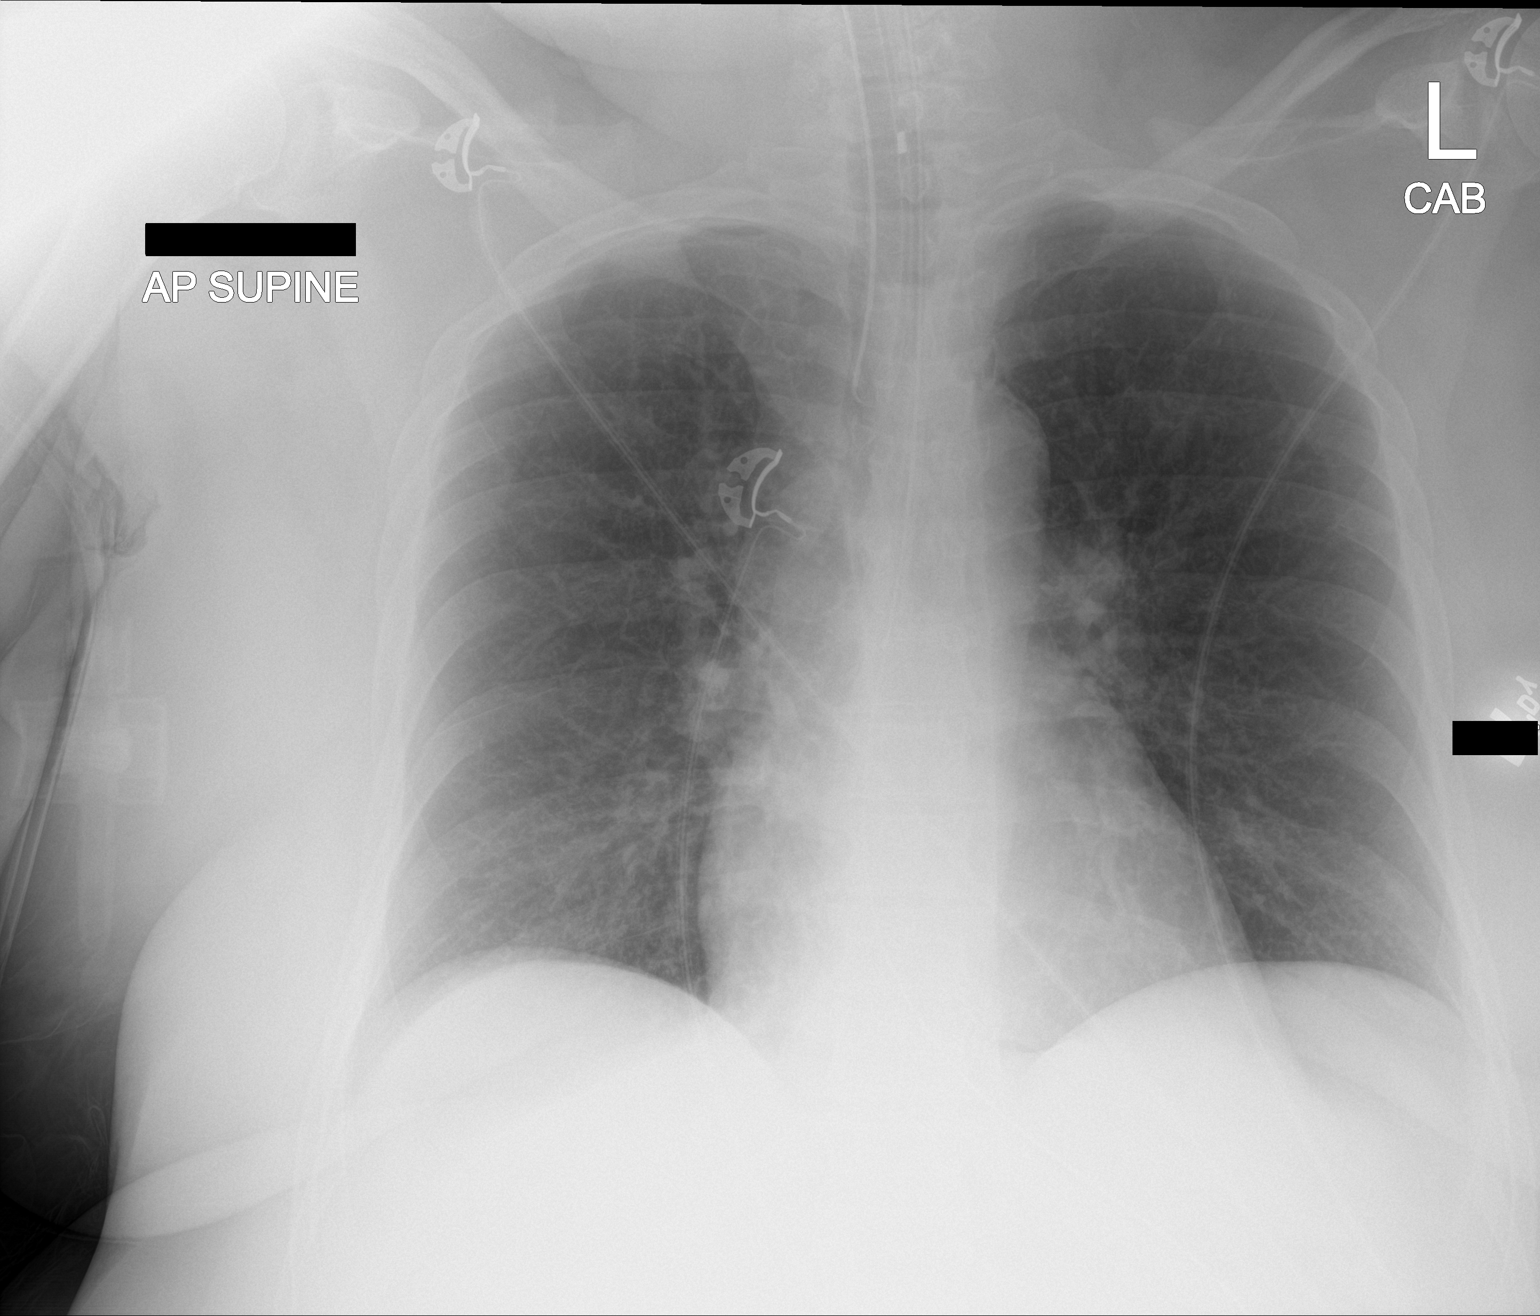

[1 of 1 positions shown; findings below may reference images not displayed]

FINDINGS: Normal heart size. Clear lung fields. ET tube 4 cm above carina,
satisfactory position. Orogastric tube tip lies somewhere in the
stomach. Stable appearance from priors.
IMPRESSION: Satisfactory ET tube placement. No active cardiopulmonary disease.

## 2020-03-26 IMAGING — DX DG CHEST 1V PORT
1 series · 1 of 1 positions shown · non-contrast
Comparison: Radiograph 10/04/2018

CLINICAL DATA: Shortness of breath for 1 week.

EXAM:
PORTABLE CHEST 1 VIEW

[chest ap]
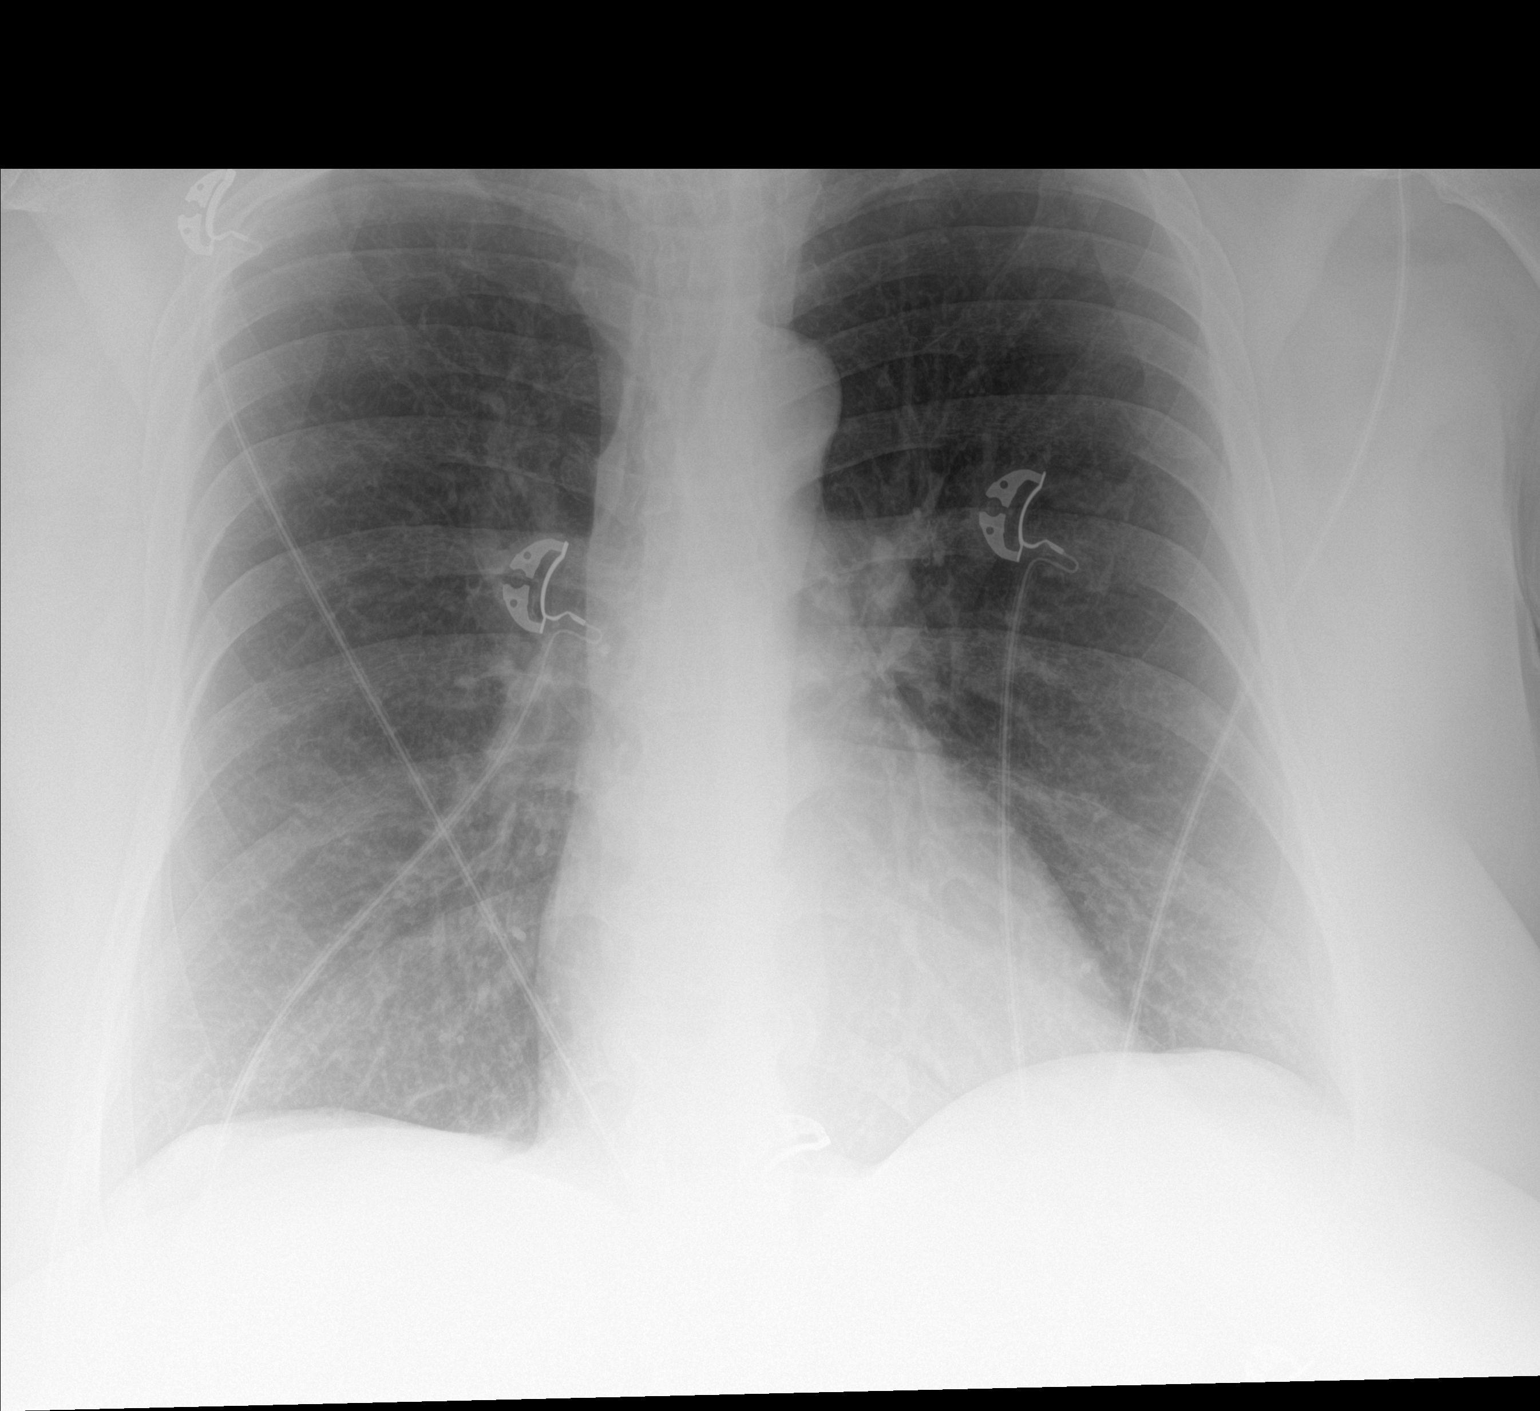

[1 of 1 positions shown; findings below may reference images not displayed]

FINDINGS: The cardiomediastinal contours are normal. Chronic hyperinflation
and bronchial thickening. Pulmonary vasculature is normal. No
consolidation, pleural effusion, or pneumothorax. No acute osseous
abnormalities are seen.
IMPRESSION: Chronic hyperinflation and bronchial thickening. No acute findings.

## 2020-04-10 DIAGNOSIS — J449 Chronic obstructive pulmonary disease, unspecified: Secondary | ICD-10-CM | POA: Diagnosis not present

## 2020-04-12 DIAGNOSIS — J449 Chronic obstructive pulmonary disease, unspecified: Secondary | ICD-10-CM | POA: Diagnosis not present

## 2020-04-15 DIAGNOSIS — J9621 Acute and chronic respiratory failure with hypoxia: Secondary | ICD-10-CM | POA: Diagnosis not present

## 2020-04-15 DIAGNOSIS — J9622 Acute and chronic respiratory failure with hypercapnia: Secondary | ICD-10-CM | POA: Diagnosis not present

## 2020-04-15 DIAGNOSIS — J449 Chronic obstructive pulmonary disease, unspecified: Secondary | ICD-10-CM | POA: Diagnosis not present

## 2020-04-16 DIAGNOSIS — J449 Chronic obstructive pulmonary disease, unspecified: Secondary | ICD-10-CM | POA: Diagnosis not present

## 2020-04-16 DIAGNOSIS — G4733 Obstructive sleep apnea (adult) (pediatric): Secondary | ICD-10-CM | POA: Diagnosis not present

## 2020-04-16 DIAGNOSIS — J45998 Other asthma: Secondary | ICD-10-CM | POA: Diagnosis not present

## 2020-04-16 DIAGNOSIS — J9621 Acute and chronic respiratory failure with hypoxia: Secondary | ICD-10-CM | POA: Diagnosis not present

## 2020-05-07 DIAGNOSIS — J439 Emphysema, unspecified: Secondary | ICD-10-CM | POA: Diagnosis not present

## 2020-05-07 DIAGNOSIS — R490 Dysphonia: Secondary | ICD-10-CM | POA: Diagnosis not present

## 2020-05-07 DIAGNOSIS — R0600 Dyspnea, unspecified: Secondary | ICD-10-CM | POA: Diagnosis not present

## 2020-05-07 DIAGNOSIS — R05 Cough: Secondary | ICD-10-CM | POA: Diagnosis not present

## 2020-05-07 DIAGNOSIS — G4733 Obstructive sleep apnea (adult) (pediatric): Secondary | ICD-10-CM | POA: Diagnosis not present

## 2020-05-10 DIAGNOSIS — J449 Chronic obstructive pulmonary disease, unspecified: Secondary | ICD-10-CM | POA: Diagnosis not present

## 2020-05-13 DIAGNOSIS — J449 Chronic obstructive pulmonary disease, unspecified: Secondary | ICD-10-CM | POA: Diagnosis not present

## 2020-05-16 DIAGNOSIS — J449 Chronic obstructive pulmonary disease, unspecified: Secondary | ICD-10-CM | POA: Diagnosis not present

## 2020-05-16 DIAGNOSIS — J9621 Acute and chronic respiratory failure with hypoxia: Secondary | ICD-10-CM | POA: Diagnosis not present

## 2020-05-16 DIAGNOSIS — J9622 Acute and chronic respiratory failure with hypercapnia: Secondary | ICD-10-CM | POA: Diagnosis not present

## 2020-05-17 DIAGNOSIS — G4733 Obstructive sleep apnea (adult) (pediatric): Secondary | ICD-10-CM | POA: Diagnosis not present

## 2020-05-17 DIAGNOSIS — J9621 Acute and chronic respiratory failure with hypoxia: Secondary | ICD-10-CM | POA: Diagnosis not present

## 2020-05-17 DIAGNOSIS — J45998 Other asthma: Secondary | ICD-10-CM | POA: Diagnosis not present

## 2020-05-17 DIAGNOSIS — J449 Chronic obstructive pulmonary disease, unspecified: Secondary | ICD-10-CM | POA: Diagnosis not present

## 2020-06-10 DIAGNOSIS — J449 Chronic obstructive pulmonary disease, unspecified: Secondary | ICD-10-CM | POA: Diagnosis not present

## 2020-06-12 DIAGNOSIS — J449 Chronic obstructive pulmonary disease, unspecified: Secondary | ICD-10-CM | POA: Diagnosis not present

## 2020-06-15 DIAGNOSIS — J449 Chronic obstructive pulmonary disease, unspecified: Secondary | ICD-10-CM | POA: Diagnosis not present

## 2020-06-15 DIAGNOSIS — J9621 Acute and chronic respiratory failure with hypoxia: Secondary | ICD-10-CM | POA: Diagnosis not present

## 2020-06-15 DIAGNOSIS — J9622 Acute and chronic respiratory failure with hypercapnia: Secondary | ICD-10-CM | POA: Diagnosis not present

## 2020-06-16 DIAGNOSIS — J9621 Acute and chronic respiratory failure with hypoxia: Secondary | ICD-10-CM | POA: Diagnosis not present

## 2020-06-16 DIAGNOSIS — G4733 Obstructive sleep apnea (adult) (pediatric): Secondary | ICD-10-CM | POA: Diagnosis not present

## 2020-06-16 DIAGNOSIS — J449 Chronic obstructive pulmonary disease, unspecified: Secondary | ICD-10-CM | POA: Diagnosis not present

## 2020-06-16 DIAGNOSIS — J45998 Other asthma: Secondary | ICD-10-CM | POA: Diagnosis not present

## 2020-07-10 DIAGNOSIS — J449 Chronic obstructive pulmonary disease, unspecified: Secondary | ICD-10-CM | POA: Diagnosis not present

## 2020-07-13 DIAGNOSIS — J449 Chronic obstructive pulmonary disease, unspecified: Secondary | ICD-10-CM | POA: Diagnosis not present

## 2020-07-16 DIAGNOSIS — J9621 Acute and chronic respiratory failure with hypoxia: Secondary | ICD-10-CM | POA: Diagnosis not present

## 2020-07-16 DIAGNOSIS — J449 Chronic obstructive pulmonary disease, unspecified: Secondary | ICD-10-CM | POA: Diagnosis not present

## 2020-07-16 DIAGNOSIS — J9622 Acute and chronic respiratory failure with hypercapnia: Secondary | ICD-10-CM | POA: Diagnosis not present

## 2020-07-17 DIAGNOSIS — J449 Chronic obstructive pulmonary disease, unspecified: Secondary | ICD-10-CM | POA: Diagnosis not present

## 2020-07-17 DIAGNOSIS — J45998 Other asthma: Secondary | ICD-10-CM | POA: Diagnosis not present

## 2020-07-17 DIAGNOSIS — G4733 Obstructive sleep apnea (adult) (pediatric): Secondary | ICD-10-CM | POA: Diagnosis not present

## 2020-07-17 DIAGNOSIS — J9621 Acute and chronic respiratory failure with hypoxia: Secondary | ICD-10-CM | POA: Diagnosis not present

## 2020-07-22 ENCOUNTER — Inpatient Hospital Stay: Payer: Medicare HMO

## 2020-07-22 ENCOUNTER — Other Ambulatory Visit: Payer: Self-pay

## 2020-07-22 ENCOUNTER — Inpatient Hospital Stay
Admission: EM | Admit: 2020-07-22 | Discharge: 2020-07-24 | DRG: 190 | Discharge status: Against Medical Advice | Payer: Medicare HMO | Attending: Internal Medicine | Admitting: Internal Medicine

## 2020-07-22 ENCOUNTER — Emergency Department: Payer: Medicare HMO

## 2020-07-22 DIAGNOSIS — I10 Essential (primary) hypertension: Secondary | ICD-10-CM

## 2020-07-22 DIAGNOSIS — Z79899 Other long term (current) drug therapy: Secondary | ICD-10-CM

## 2020-07-22 DIAGNOSIS — R0602 Shortness of breath: Secondary | ICD-10-CM | POA: Diagnosis not present

## 2020-07-22 DIAGNOSIS — I129 Hypertensive chronic kidney disease with stage 1 through stage 4 chronic kidney disease, or unspecified chronic kidney disease: Secondary | ICD-10-CM | POA: Diagnosis present

## 2020-07-22 DIAGNOSIS — E1142 Type 2 diabetes mellitus with diabetic polyneuropathy: Secondary | ICD-10-CM | POA: Diagnosis present

## 2020-07-22 DIAGNOSIS — I160 Hypertensive urgency: Secondary | ICD-10-CM | POA: Diagnosis present

## 2020-07-22 DIAGNOSIS — K439 Ventral hernia without obstruction or gangrene: Secondary | ICD-10-CM | POA: Diagnosis present

## 2020-07-22 DIAGNOSIS — J9621 Acute and chronic respiratory failure with hypoxia: Secondary | ICD-10-CM | POA: Diagnosis present

## 2020-07-22 DIAGNOSIS — Z7984 Long term (current) use of oral hypoglycemic drugs: Secondary | ICD-10-CM | POA: Diagnosis not present

## 2020-07-22 DIAGNOSIS — R062 Wheezing: Secondary | ICD-10-CM | POA: Diagnosis not present

## 2020-07-22 DIAGNOSIS — R0689 Other abnormalities of breathing: Secondary | ICD-10-CM | POA: Diagnosis not present

## 2020-07-22 DIAGNOSIS — E1122 Type 2 diabetes mellitus with diabetic chronic kidney disease: Secondary | ICD-10-CM | POA: Diagnosis present

## 2020-07-22 DIAGNOSIS — E1165 Type 2 diabetes mellitus with hyperglycemia: Secondary | ICD-10-CM | POA: Diagnosis present

## 2020-07-22 DIAGNOSIS — Z7951 Long term (current) use of inhaled steroids: Secondary | ICD-10-CM | POA: Diagnosis not present

## 2020-07-22 DIAGNOSIS — F17210 Nicotine dependence, cigarettes, uncomplicated: Secondary | ICD-10-CM | POA: Diagnosis present

## 2020-07-22 DIAGNOSIS — N183 Chronic kidney disease, stage 3 unspecified: Secondary | ICD-10-CM | POA: Diagnosis present

## 2020-07-22 DIAGNOSIS — J441 Chronic obstructive pulmonary disease with (acute) exacerbation: Secondary | ICD-10-CM | POA: Diagnosis not present

## 2020-07-22 DIAGNOSIS — R069 Unspecified abnormalities of breathing: Secondary | ICD-10-CM | POA: Diagnosis not present

## 2020-07-22 DIAGNOSIS — K469 Unspecified abdominal hernia without obstruction or gangrene: Secondary | ICD-10-CM | POA: Diagnosis not present

## 2020-07-22 DIAGNOSIS — Z20822 Contact with and (suspected) exposure to covid-19: Secondary | ICD-10-CM | POA: Diagnosis present

## 2020-07-22 DIAGNOSIS — Z6841 Body Mass Index (BMI) 40.0 and over, adult: Secondary | ICD-10-CM | POA: Diagnosis not present

## 2020-07-22 DIAGNOSIS — K219 Gastro-esophageal reflux disease without esophagitis: Secondary | ICD-10-CM | POA: Diagnosis present

## 2020-07-22 DIAGNOSIS — F1420 Cocaine dependence, uncomplicated: Secondary | ICD-10-CM | POA: Diagnosis present

## 2020-07-22 DIAGNOSIS — E1129 Type 2 diabetes mellitus with other diabetic kidney complication: Secondary | ICD-10-CM | POA: Diagnosis present

## 2020-07-22 DIAGNOSIS — N1831 Chronic kidney disease, stage 3a: Secondary | ICD-10-CM | POA: Diagnosis not present

## 2020-07-22 DIAGNOSIS — K5730 Diverticulosis of large intestine without perforation or abscess without bleeding: Secondary | ICD-10-CM | POA: Diagnosis not present

## 2020-07-22 DIAGNOSIS — I700 Atherosclerosis of aorta: Secondary | ICD-10-CM | POA: Diagnosis not present

## 2020-07-22 LAB — COMPREHENSIVE METABOLIC PANEL
ALT: 34 U/L (ref 0–44)
AST: 22 U/L (ref 15–41)
Albumin: 3.7 g/dL (ref 3.5–5.0)
Alkaline Phosphatase: 79 U/L (ref 38–126)
Anion gap: 9 (ref 5–15)
BUN: 33 mg/dL — ABNORMAL HIGH (ref 6–20)
CO2: 25 mmol/L (ref 22–32)
Calcium: 8.7 mg/dL — ABNORMAL LOW (ref 8.9–10.3)
Chloride: 108 mmol/L (ref 98–111)
Creatinine, Ser: 1.55 mg/dL — ABNORMAL HIGH (ref 0.44–1.00)
GFR calc Af Amer: 43 mL/min — ABNORMAL LOW (ref >60)
GFR calc non Af Amer: 37 mL/min — ABNORMAL LOW (ref >60)
Glucose, Bld: 123 mg/dL — ABNORMAL HIGH (ref 70–99)
Potassium: 4 mmol/L (ref 3.5–5.1)
Sodium: 142 mmol/L (ref 135–145)
Total Bilirubin: 0.5 mg/dL (ref 0.3–1.2)
Total Protein: 6.7 g/dL (ref 6.5–8.1)

## 2020-07-22 LAB — CBC
HCT: 44.3 % (ref 36.0–46.0)
Hemoglobin: 14.7 g/dL (ref 12.0–15.0)
MCH: 28.9 pg (ref 26.0–34.0)
MCHC: 33.2 g/dL (ref 30.0–36.0)
MCV: 87.2 fL (ref 80.0–100.0)
Platelets: 357 10*3/uL (ref 150–400)
RBC: 5.08 MIL/uL (ref 3.87–5.11)
RDW: 13.4 % (ref 11.5–15.5)
WBC: 9.5 10*3/uL (ref 4.0–10.5)
nRBC: 0 % (ref 0.0–0.2)

## 2020-07-22 LAB — SARS CORONAVIRUS 2 BY RT PCR (HOSPITAL ORDER, PERFORMED IN ~~LOC~~ HOSPITAL LAB): SARS Coronavirus 2: NEGATIVE

## 2020-07-22 LAB — GLUCOSE, CAPILLARY
Glucose-Capillary: 147 mg/dL — ABNORMAL HIGH (ref 70–99)
Glucose-Capillary: 183 mg/dL — ABNORMAL HIGH (ref 70–99)
Glucose-Capillary: 278 mg/dL — ABNORMAL HIGH (ref 70–99)
Glucose-Capillary: 222 mg/dL — ABNORMAL HIGH (ref 70–99)
Glucose-Capillary: 221 mg/dL — ABNORMAL HIGH (ref 70–99)

## 2020-07-22 LAB — TROPONIN I (HIGH SENSITIVITY)
Troponin I (High Sensitivity): 6 ng/L (ref <18)
Troponin I (High Sensitivity): 5 ng/L (ref <18)

## 2020-07-22 LAB — URINE DRUG SCREEN, QUALITATIVE (ARMC ONLY)
Amphetamines, Ur Screen: NOT DETECTED
Barbiturates, Ur Screen: NOT DETECTED
Benzodiazepine, Ur Scrn: NOT DETECTED
Cannabinoid 50 Ng, Ur ~~LOC~~: NOT DETECTED
Cocaine Metabolite,Ur ~~LOC~~: POSITIVE — AB
MDMA (Ecstasy)Ur Screen: NOT DETECTED
Methadone Scn, Ur: NOT DETECTED
Opiate, Ur Screen: NOT DETECTED
Phencyclidine (PCP) Ur S: NOT DETECTED
Tricyclic, Ur Screen: NOT DETECTED

## 2020-07-22 LAB — BLOOD GAS, VENOUS
Acid-Base Excess: 0.1 mmol/L (ref 0.0–2.0)
Bicarbonate: 26 mmol/L (ref 20.0–28.0)
O2 Saturation: 94.1 %
Patient temperature: 37
pCO2, Ven: 46 mmHg (ref 44.0–60.0)
pH, Ven: 7.36 (ref 7.250–7.430)
pO2, Ven: 74 mmHg — ABNORMAL HIGH (ref 32.0–45.0)

## 2020-07-22 MED ORDER — IPRATROPIUM-ALBUTEROL 0.5-2.5 (3) MG/3ML IN SOLN: 3.0000 mL | Freq: Once | RESPIRATORY_TRACT | Status: AC

## 2020-07-22 MED ORDER — IPRATROPIUM-ALBUTEROL 0.5-2.5 (3) MG/3ML IN SOLN
3.0000 mL | RESPIRATORY_TRACT | Status: DC
  Administered 2020-07-22 – 2020-07-24 (×12): 3 mL via RESPIRATORY_TRACT
  Filled 2020-07-22 (×13): qty 3

## 2020-07-22 MED ORDER — HYDRALAZINE HCL 20 MG/ML IJ SOLN
5.0000 mg | INTRAMUSCULAR | Status: DC | PRN
  Administered 2020-07-22 – 2020-07-23 (×3): 5 mg via INTRAVENOUS
  Filled 2020-07-22 (×3): qty 1

## 2020-07-22 MED ORDER — PANTOPRAZOLE SODIUM 40 MG PO TBEC
40.0000 mg | DELAYED_RELEASE_TABLET | Freq: Every day | ORAL | Status: DC
  Administered 2020-07-22 – 2020-07-24 (×3): 40 mg via ORAL
  Filled 2020-07-22 (×3): qty 1

## 2020-07-22 MED ORDER — METHYLPREDNISOLONE SODIUM SUCC 125 MG IJ SOLR: 125.0000 mg | Freq: Once | INTRAMUSCULAR | Status: AC

## 2020-07-22 MED ORDER — ACETAMINOPHEN 325 MG PO TABS
650.0000 mg | ORAL_TABLET | Freq: Four times a day (QID) | ORAL | Status: DC | PRN
  Administered 2020-07-22: 650 mg via ORAL
  Filled 2020-07-22: qty 2

## 2020-07-22 MED ORDER — NICOTINE 21 MG/24HR TD PT24
21.0000 mg | MEDICATED_PATCH | Freq: Every day | TRANSDERMAL | Status: DC
  Administered 2020-07-22 – 2020-07-24 (×3): 21 mg via TRANSDERMAL
  Filled 2020-07-22 (×3): qty 1

## 2020-07-22 MED ORDER — INSULIN ASPART 100 UNIT/ML ~~LOC~~ SOLN
0.0000 [IU] | Freq: Three times a day (TID) | SUBCUTANEOUS | Status: DC
  Administered 2020-07-22: 1 [IU] via SUBCUTANEOUS
  Administered 2020-07-22: 2 [IU] via SUBCUTANEOUS
  Administered 2020-07-22: 5 [IU] via SUBCUTANEOUS
  Administered 2020-07-23 (×2): 3 [IU] via SUBCUTANEOUS
  Administered 2020-07-23: 7 [IU] via SUBCUTANEOUS
  Administered 2020-07-24: 3 [IU] via SUBCUTANEOUS
  Filled 2020-07-22 (×7): qty 1

## 2020-07-22 MED ORDER — MOMETASONE FURO-FORMOTEROL FUM 200-5 MCG/ACT IN AERO
2.0000 | INHALATION_SPRAY | Freq: Two times a day (BID) | RESPIRATORY_TRACT | Status: DC
  Administered 2020-07-22 – 2020-07-24 (×4): 2 via RESPIRATORY_TRACT
  Filled 2020-07-22: qty 8.8

## 2020-07-22 MED ORDER — MONTELUKAST SODIUM 10 MG PO TABS
10.0000 mg | ORAL_TABLET | Freq: Every day | ORAL | Status: DC
  Administered 2020-07-22 – 2020-07-23 (×2): 10 mg via ORAL
  Filled 2020-07-22 (×3): qty 1

## 2020-07-22 MED ORDER — MAGNESIUM SULFATE 2 GM/50ML IV SOLN
2.0000 g | Freq: Once | INTRAVENOUS | Status: AC
  Administered 2020-07-22: 2 g via INTRAVENOUS
  Filled 2020-07-22: qty 50

## 2020-07-22 MED ORDER — ALBUTEROL SULFATE (2.5 MG/3ML) 0.083% IN NEBU
2.5000 mg | INHALATION_SOLUTION | RESPIRATORY_TRACT | Status: DC | PRN
  Administered 2020-07-22: 2.5 mg via RESPIRATORY_TRACT
  Filled 2020-07-22: qty 3

## 2020-07-22 MED ORDER — GABAPENTIN 300 MG PO CAPS
300.0000 mg | ORAL_CAPSULE | Freq: Three times a day (TID) | ORAL | Status: DC
  Administered 2020-07-22 – 2020-07-24 (×6): 300 mg via ORAL
  Filled 2020-07-22 (×6): qty 1

## 2020-07-22 MED ORDER — HYDRALAZINE HCL 20 MG/ML IJ SOLN
10.0000 mg | Freq: Once | INTRAMUSCULAR | Status: AC
  Administered 2020-07-22: 10 mg via INTRAVENOUS
  Filled 2020-07-22: qty 1

## 2020-07-22 MED ORDER — INSULIN ASPART 100 UNIT/ML ~~LOC~~ SOLN
0.0000 [IU] | Freq: Every day | SUBCUTANEOUS | Status: DC
  Administered 2020-07-22 – 2020-07-23 (×2): 2 [IU] via SUBCUTANEOUS
  Filled 2020-07-22 (×2): qty 1

## 2020-07-22 MED ORDER — SODIUM CHLORIDE 0.9% FLUSH
10.0000 mL | Freq: Two times a day (BID) | INTRAVENOUS | Status: DC
  Administered 2020-07-22 – 2020-07-24 (×4): 10 mL via INTRAVENOUS

## 2020-07-22 MED ORDER — LOSARTAN POTASSIUM 50 MG PO TABS
50.0000 mg | ORAL_TABLET | Freq: Every day | ORAL | Status: DC
  Administered 2020-07-22 – 2020-07-24 (×3): 50 mg via ORAL
  Filled 2020-07-22 (×3): qty 1

## 2020-07-22 MED ORDER — LOSARTAN POTASSIUM-HCTZ 50-12.5 MG PO TABS: 1.0000 | ORAL_TABLET | Freq: Every day | ORAL | Status: DC

## 2020-07-22 MED ORDER — METHYLPREDNISOLONE SODIUM SUCC 125 MG IJ SOLR
60.0000 mg | Freq: Two times a day (BID) | INTRAMUSCULAR | Status: DC
  Administered 2020-07-22 – 2020-07-24 (×4): 60 mg via INTRAVENOUS
  Filled 2020-07-22 (×4): qty 2

## 2020-07-22 MED ORDER — ENOXAPARIN SODIUM 40 MG/0.4ML ~~LOC~~ SOLN
40.0000 mg | Freq: Two times a day (BID) | SUBCUTANEOUS | Status: DC
  Administered 2020-07-22 – 2020-07-23 (×4): 40 mg via SUBCUTANEOUS
  Filled 2020-07-22 (×4): qty 0.4

## 2020-07-22 MED ORDER — AZITHROMYCIN 250 MG PO TABS
250.0000 mg | ORAL_TABLET | Freq: Every day | ORAL | Status: DC
  Administered 2020-07-23 – 2020-07-24 (×2): 250 mg via ORAL
  Filled 2020-07-22 (×2): qty 1

## 2020-07-22 MED ORDER — HYDROCHLOROTHIAZIDE 12.5 MG PO CAPS
12.5000 mg | ORAL_CAPSULE | Freq: Every day | ORAL | Status: DC
  Administered 2020-07-22 – 2020-07-24 (×3): 12.5 mg via ORAL
  Filled 2020-07-22 (×3): qty 1

## 2020-07-22 MED ORDER — METHYLPREDNISOLONE SODIUM SUCC 125 MG IJ SOLR
INTRAMUSCULAR | Status: AC
  Administered 2020-07-22: 125 mg via INTRAVENOUS
  Filled 2020-07-22: qty 2

## 2020-07-22 MED ORDER — IPRATROPIUM-ALBUTEROL 0.5-2.5 (3) MG/3ML IN SOLN
RESPIRATORY_TRACT | Status: AC
  Administered 2020-07-22: 3 mL via RESPIRATORY_TRACT
  Filled 2020-07-22: qty 6

## 2020-07-22 MED ORDER — AZITHROMYCIN 500 MG PO TABS
500.0000 mg | ORAL_TABLET | Freq: Every day | ORAL | Status: AC
  Administered 2020-07-22: 500 mg via ORAL
  Filled 2020-07-22: qty 1

## 2020-07-22 MED ORDER — IOHEXOL 9 MG/ML PO SOLN
1000.0000 mL | Freq: Once | ORAL | Status: AC | PRN
  Administered 2020-07-22: 1000 mL via ORAL

## 2020-07-22 MED ORDER — ONDANSETRON HCL 4 MG/2ML IJ SOLN: 4.0000 mg | Freq: Three times a day (TID) | INTRAMUSCULAR | Status: DC | PRN

## 2020-07-22 MED ORDER — IPRATROPIUM-ALBUTEROL 0.5-2.5 (3) MG/3ML IN SOLN
3.0000 mL | Freq: Once | RESPIRATORY_TRACT | Status: AC
  Administered 2020-07-22: 3 mL via RESPIRATORY_TRACT

## 2020-07-22 MED ORDER — DM-GUAIFENESIN ER 30-600 MG PO TB12: 1.0000 | ORAL_TABLET | Freq: Two times a day (BID) | ORAL | Status: DC | PRN

## 2020-07-22 NOTE — Progress Notes (Signed)
PHARMACIST - PHYSICIAN COMMUNICATION  CONCERNING:  Enoxaparin (Lovenox) for DVT Prophylaxis    RECOMMENDATION: Patient was prescribed enoxaprin 40mg  q24 hours for VTE prophylaxis.   Filed Weights   07/22/20 0603  Weight: 116.1 kg (256 lb)    Body mass index is 40.1 kg/m.  Estimated Creatinine Clearance: 54 mL/min (A) (by C-G formula based on SCr of 1.55 mg/dL (H)).  Based on [facility] policy patient is candidate for enoxaparin 40mg  every 12 hour dosing due to BMI being >40.  DESCRIPTION: Pharmacy has adjusted enoxaparin dose per [facility] policy.  Patient is now receiving enoxaparin 40mg  every 12 hours.    [facility], PharmD, BCPS Clinical Pharmacist 07/22/2020 8:58 AM

## 2020-07-22 NOTE — ED Notes (Addendum)
Pt back in bed and willing to stay at this time. Pt placed on 4L . Satting at 98%, RR 11. Breathing treatment administered.

## 2020-07-22 NOTE — ED Notes (Signed)
Pt put bipap back on herself. Respiratory called to confirm settings.

## 2020-07-22 NOTE — ED Notes (Signed)
Patient transported to CT 

## 2020-07-22 NOTE — ED Notes (Signed)
Pt removed bipap again. 94% on RA.

## 2020-07-22 NOTE — ED Provider Notes (Signed)
[facility] Emergency Department Provider Note  ____________________________________________   First MD Initiated Contact with Patient 07/22/20 [phone]     (approximate)  I have reviewed the triage vital signs and the nursing notes.   HISTORY  Chief Complaint Wheezing   HPI Samantha [name] is a 54 y.o. female with below list of previous medical conditions including COPD hypertension diabetes mellitus presents to the emergency department via EMS secondary to 2-day history of progressive dyspnea.  Patient also admits to cough.  Patient denies any fever afebrile on presentation.  Patient denies any chest pain.        Past Medical History:  Diagnosis Date  . Acute on chronic respiratory failure with hypoxemia ([facility]) 08/30/2015  . Asthma   . COPD (chronic obstructive pulmonary disease) ([facility])   . Diabetes mellitus without complication ([facility])   . Hypertension     Patient Active Problem List   Diagnosis Date Noted  . Ventral hernia with obstruction and without gangrene 09/10/2019  . Acute respiratory failure with hypoxemia ([facility]) 11/25/2018  . COPD exacerbation ([facility]) 02/21/2018  . Acute on chronic respiratory failure ([facility]) 10/06/2015  . Acute on chronic respiratory failure with hypoxemia ([facility]) 08/30/2015    Past Surgical History:  Procedure Laterality Date  . none      Prior to Admission medications   Medication Sig Start Date End Date Taking? Authorizing Provider  albuterol (PROVENTIL HFA) 108 (90 Base) MCG/ACT inhaler Inhale 2 puffs into the lungs every 4 (four) hours as needed for wheezing or shortness of breath. 11/16/19   Sharman Cheek, MD  albuterol (PROVENTIL HFA;VENTOLIN HFA) 108 [phone] Base) MCG/ACT inhaler Inhale 2 puffs into the lungs every 6 (six) hours as needed for wheezing or shortness of breath. 08/01/18   Shaune Pollack, MD  amoxicillin (AMOXIL) 875 MG tablet Take 1 tablet (875 mg total) by mouth 2 (two) times daily. 02/25/20   Sharman Cheek, MD  budesonide-formoterol [facility]) 160-4.5 MCG/ACT inhaler Inhale 2 puffs into the lungs 2 (two) times daily. 11/16/19   Sharman Cheek, MD  budesonide-formoterol [facility]) 160-4.5 MCG/ACT inhaler Inhale 2 puffs into the lungs 2 (two) times daily. 01/22/20   Nita Sickle, MD  gabapentin (NEURONTIN) 300 MG capsule Take 300 mg by mouth 3 (three) times daily.     [provider]  guaiFENesin (ROBITUSSIN) 100 MG/5ML SOLN Take 5 mLs (100 mg total) by mouth every 4 (four) hours as needed for cough or to loosen phlegm. 02/25/20   Sharman Cheek, MD  ipratropium-albuterol (DUONEB) 0.5-2.5 (3) MG/3ML SOLN Take 3 mLs by nebulization every 6 (six) hours as needed (shortness of breath).    [provider]  losartan-hydrochlorothiazide (HYZAAR) 50-12.5 MG tablet Take 1 tablet by mouth daily. 02/25/20   Sharman Cheek, MD  metFORMIN (GLUCOPHAGE) 850 MG tablet Take 850 mg by mouth daily.     [provider]  montelukast (SINGULAIR) 10 MG tablet Take 10 mg by mouth at bedtime.     [provider]  mupirocin ointment (BACTROBAN) 2 % Apply to affected area on upper lip 3 times daily for 7 days 02/03/20 02/02/21  Shaune Pollack, MD  omeprazole (PRILOSEC) 20 MG capsule Take 20 mg by mouth daily.  05/05/19   [provider]  tiotropium (SPIRIVA) 18 MCG inhalation capsule Place 18 mcg into inhaler and inhale daily.    [provider]    Allergies Percocet [oxycodone-acetaminophen]  Family History  Problem Relation Age of Onset  . Asthma Mother   .  Diabetes Mother   . Hypertension Mother   . Asthma Sister   . Heart attack Father     Social History Social History   Tobacco Use  . Smoking status: Current Every Day Smoker    Packs/day: 0.10    Years: 23.00    Pack years: 2.30    Types: Cigarettes  . Smokeless tobacco: Never Used  Vaping Use  . Vaping Use: Never used  Substance Use Topics  . Alcohol use: Yes    Alcohol/week:  1.0 standard drink    Types: 1 Cans of beer per week  . Drug use: Yes    Types: Cocaine    Comment: x 1 week ago    Review of Systems Constitutional: No fever/chills Eyes: No visual changes. ENT: No sore throat. Cardiovascular: Denies chest pain. Respiratory: Positive for dyspnea and cough Gastrointestinal: No abdominal pain.  No nausea, no vomiting.  No diarrhea.  No constipation. Genitourinary: Negative for dysuria. Musculoskeletal: Negative for neck pain.  Negative for back pain. Integumentary: Negative for rash. Neurological: Negative for headaches, focal weakness or numbness.   ____________________________________________   PHYSICAL EXAM:  VITAL SIGNS: ED Triage Vitals  Enc Vitals Group     BP 07/22/20 0602 (!) 204/129     Pulse Rate 07/22/20 0602 83     Resp 07/22/20 0602 14     Temp 07/22/20 0619 (!) 97.1 F (36.2 C)     Temp Source 07/22/20 0619 Axillary     SpO2 07/22/20 0602 99 %     Weight 07/22/20 0603 116.1 kg (256 lb)     Height 07/22/20 0603 1.702 m (5\' 7" )     Head Circumference --      Peak Flow --      Pain Score 07/22/20 0603 10     Pain Loc --      Pain Edu? --      Excl. in GC? --     Constitutional: Alert and oriented.  Apparent respiratory distress Eyes: Conjunctivae are normal.  Head: Atraumatic. Mouth/Throat: Patient is wearing a mask. Neck: No stridor.  No meningeal signs.   Cardiovascular: Tachycardia, regular rhythm. Good peripheral circulation. Grossly normal heart sounds. Respiratory: Tachypnea, positive accessory respiratory muscle use, diffuse rhonchi Gastrointestinal: Soft and nontender. No distention.  Musculoskeletal: No lower extremity tenderness nor edema. No gross deformities of extremities. Neurologic:  Normal speech and language. No gross focal neurologic deficits are appreciated.  Skin:  Skin is warm, dry and intact. Psychiatric: Mood and affect are normal. Speech and behavior are  normal.  ____________________________________________   LABS (all labs ordered are listed, but only abnormal results are displayed)  Labs Reviewed  SARS CORONAVIRUS 2 BY RT PCR (HOSPITAL ORDER, PERFORMED IN [place])  CBC  COMPREHENSIVE METABOLIC PANEL  BLOOD GAS, VENOUS  TROPONIN I (HIGH SENSITIVITY)   ____________________________________________  EKG ED ECG REPORT I, Wren N Sokha Craker, the attending physician, personally viewed and interpreted this ECG.   Date: 07/22/2020  EKG Time: 5:55 AM  Rate: 85  Rhythm: Normal sinus rhythm  Axis: Normal  Intervals: Normal  ST&T Change: None   ____________________________________________  RADIOLOGY I, [place], personally viewed and evaluated these images (plain radiographs) as part of my medical decision making, as well as reviewing the written report by the radiologist.  ED MD interpretation: No active cardiopulmonary disease noted on chest x-ray per radiologist.  Official radiology report(s): DG Chest Portable 1 View  Result Date: 07/22/2020 CLINICAL DATA:  Shortness  of breath. EXAM: PORTABLE CHEST 1 VIEW COMPARISON:  02/25/2020 FINDINGS: The cardiac silhouette, mediastinal and hilar contours are within normal limits. The lungs are clear. No pleural effusion. No pulmonary lesions. The bony thorax is intact. IMPRESSION: No acute cardiopulmonary findings. Electronically Signed   By: Rudie Meyer M.D.   On: 07/22/2020 06:14    ____________________________________________   PROCEDURES    .Critical Care Performed by: Darci Current, MD Authorized by: Darci Current, MD   Critical care provider statement:    Critical care time (minutes):  30   Critical care time was exclusive of:  Separately billable procedures and treating other patients   Critical care was necessary to treat or prevent imminent or life-threatening deterioration of the following conditions:  Respiratory failure   Critical  care was time spent personally by me on the following activities:  Development of treatment plan with patient or surrogate, discussions with consultants, evaluation of patient's response to treatment, examination of patient, obtaining history from patient or surrogate, ordering and performing treatments and interventions, ordering and review of laboratory studies, ordering and review of radiographic studies, pulse oximetry, re-evaluation of patient's condition and review of old charts     ____________________________________________   INITIAL IMPRESSION / MDM / ASSESSMENT AND PLAN / ED COURSE  As part of my medical decision making, I reviewed the following data within the electronic MEDICAL RECORD NUMBER   54 year old female presented with above-stated history and physical exam secondary respiratory distress.  Differential diagnosis including but not limited to COPD exacerbation, pneumonia, COVID-19, pulmonary edema, ACS.  BiPAP was applied to the patient immediately upon arrival.  Patient received 2 duo nebs 125 mg of IV Solu-Medrol and 2 g of magnesium sulfate.  On reevaluation patient respiratory status much improved.  The patient removed the BiPAP stating "I do not need it anymore".  Agrees to remain in the hospital for further management.  Patient discussed with hospitalist after admission for further evaluation and management. ____________________________________________  FINAL CLINICAL IMPRESSION(S) / ED DIAGNOSES  Final diagnoses:  COPD exacerbation (HCC)     MEDICATIONS GIVEN DURING THIS VISIT:  Medications  magnesium sulfate IVPB 2 g 50 mL (2 g Intravenous New Bag/Given 07/22/20 0619)  ipratropium-albuterol (DUONEB) 0.5-2.5 (3) MG/3ML nebulizer solution 3 mL (3 mLs Nebulization Given 07/22/20 0606)  ipratropium-albuterol (DUONEB) 0.5-2.5 (3) MG/3ML nebulizer solution 3 mL (3 mLs Nebulization Given 07/22/20 0606)  methylPREDNISolone sodium succinate (SOLU-MEDROL) 125 mg/2 mL injection  125 mg (125 mg Intravenous Given 07/22/20 0606)     ED Discharge Orders    None      *Please note:  Destyne A Yeakle was evaluated in Emergency Department on 07/22/2020 for the symptoms described in the history of present illness. She was evaluated in the context of the global COVID-19 pandemic, which necessitated consideration that the patient might be at risk for infection with the SARS-CoV-2 virus that causes COVID-19. Institutional protocols and algorithms that pertain to the evaluation of patients at risk for COVID-19 are in a state of rapid change based on information released by regulatory bodies including the CDC and federal and state organizations. These policies and algorithms were followed during the patient's care in the ED.  Some ED evaluations and interventions may be delayed as a result of limited staffing during and after the pandemic.*  Note:  This document was prepared using Dragon voice recognition software and may include unintentional dictation errors.   Darci Current, MD 07/23/20 0030

## 2020-07-22 NOTE — ED Notes (Signed)
Pt ambulatory to bathroom with standby assist at this time.

## 2020-07-22 NOTE — ED Triage Notes (Signed)
Pt to ed via [facility] fro home c/o [facility] x2 days 1 breathing tx enroute  Pt with labored breathign and audible wheezing  Hx of intubation

## 2020-07-22 NOTE — H&P (Signed)
History and Physical    Samantha [name] MWN:[id number] DOB: [birth year] DOA: 07/22/2020  Referring MD/NP/PA:   [facility]   Patient coming from:  The patient is coming from home.  At baseline, pt is independent for most of ADL.        Chief Complaint: SOB  HPI: Samantha [name] is a 54 y.o. female with medical history significant of hypertension, diabetes mellitus, COPD, asthma, GERD, ventral hernia, tobacco abuse, drug abuse, CKD stage III, who presents with shortness breath.  Patient states that she has shortness of breath for more than 2 days, which has been progressively worsening.  She also has cough and wheezing. She has some yellow-colored sputum production.  Denies chest pain, fever or chills.  Patient does not have nausea, vomiting, diarrhea.  No symptoms of UTI.  Patient states she has a chronic large ventral hernia, today she has moderate tenderness over the hernia.  ED Course: pt was found to have negative COVID-19 PCR, WBC 9.6, troponin 6, positive UDS for cocaine, renal function close to baseline, temperature 97.1, elevated blood pressure 204/129, heart rate 83, RR 14, oxygen saturation 99% on 8 L nasal cannula oxygen, BiPAP started in ED, chest x-ray negative patient is admitted to progressive bed as inpatient.  Review of Systems:   General: no fevers, chills, no body weight gain, has fatigue HEENT: no blurry vision, hearing changes or sore throat Respiratory: has dyspnea, coughing, wheezing CV: no chest pain, no palpitations GI: no nausea, vomiting, has ventral hernia with pain, diarrhea, constipation GU: no dysuria, burning on urination, increased urinary frequency, hematuria  Ext: no leg edema Neuro: no unilateral weakness, numbness, or tingling, no vision change or hearing loss Skin: no rash, no skin tear. MSK: No muscle spasm, no deformity, no limitation of range of movement in spin Heme: No easy bruising.  Travel history: No recent long distant  travel.  Allergy:  Allergies  Allergen Reactions  . Percocet [Oxycodone-Acetaminophen] Itching    Past Medical History:  Diagnosis Date  . Acute on chronic respiratory failure with hypoxemia (HCC) 08/30/2015  . Asthma   . COPD (chronic obstructive pulmonary disease) (HCC)   . Diabetes mellitus without complication (HCC)   . Hypertension     Past Surgical History:  Procedure Laterality Date  . none      Social History:  reports that she has been smoking cigarettes. She has a 2.30 pack-year smoking history. She has never used smokeless tobacco. She reports current alcohol use of about 1.0 standard drink of alcohol per week. She reports current drug use. Drug: Cocaine.  Family History:  Family History  Problem Relation Age of Onset  . Asthma Mother   . Diabetes Mother   . Hypertension Mother   . Asthma Sister   . Heart attack Father      Prior to Admission medications   Medication Sig Start Date End Date Taking? Authorizing Provider  albuterol (PROVENTIL HFA) 108 (90 Base) MCG/ACT inhaler Inhale 2 puffs into the lungs every 4 (four) hours as needed for wheezing or shortness of breath. 11/16/19   Sharman CheekStafford, Phillip, MD  albuterol (PROVENTIL HFA;VENTOLIN HFA) 108 [phone](90 Base) MCG/ACT inhaler Inhale 2 puffs into the lungs every 6 (six) hours as needed for wheezing or shortness of breath. 08/01/18   Shaune Pollackhen, Qing, MD  amoxicillin (AMOXIL) 875 MG tablet Take 1 tablet (875 mg total) by mouth 2 (two) times daily. 02/25/20   Sharman CheekStafford, Phillip, MD  budesonide-formoterol [facility](SYMBICORT) 160-4.5 MCG/ACT inhaler  Inhale 2 puffs into the lungs 2 (two) times daily. 11/16/19   Sharman Cheek, MD  budesonide-formoterol [facility]) 160-4.5 MCG/ACT inhaler Inhale 2 puffs into the lungs 2 (two) times daily. 01/22/20   Nita Sickle, MD  gabapentin (NEURONTIN) 300 MG capsule Take 300 mg by mouth 3 (three) times daily.     [provider]  guaiFENesin (ROBITUSSIN) 100 MG/5ML SOLN Take 5 mLs (100 mg  total) by mouth every 4 (four) hours as needed for cough or to loosen phlegm. 02/25/20   Sharman Cheek, MD  ipratropium-albuterol (DUONEB) 0.5-2.5 (3) MG/3ML SOLN Take 3 mLs by nebulization every 6 (six) hours as needed (shortness of breath).    [provider]  losartan-hydrochlorothiazide (HYZAAR) 50-12.5 MG tablet Take 1 tablet by mouth daily. 02/25/20   Sharman Cheek, MD  metFORMIN (GLUCOPHAGE) 850 MG tablet Take 850 mg by mouth daily.     [provider]  montelukast (SINGULAIR) 10 MG tablet Take 10 mg by mouth at bedtime.     [provider]  mupirocin ointment (BACTROBAN) 2 % Apply to affected area on upper lip 3 times daily for 7 days 02/03/20 02/02/21  Shaune Pollack, MD  omeprazole (PRILOSEC) 20 MG capsule Take 20 mg by mouth daily.  05/05/19   [provider]  tiotropium (SPIRIVA) 18 MCG inhalation capsule Place 18 mcg into inhaler and inhale daily.    [provider]    Physical Exam: Vitals:   07/22/20 1230 07/22/20 1300 07/22/20 1523 07/22/20 1524  BP: (!) 167/94 (!) 167/87    Pulse: 95 92 (!) 106 (!) 107  Resp:  12 20 20   Temp:      TempSrc:      SpO2: 99% 98% 99% 100%  Weight:      Height:       General: Not in acute distress HEENT:       Eyes: PERRL, EOMI, no scleral icterus.       ENT: No discharge from the ears and nose, no pharynx injection, no tonsillar enlargement.        Neck: No JVD, no bruit, no mass felt. Heme: No neck lymph node enlargement. Cardiac: S1/S2, RRR, No murmurs, No gallops or rubs. Respiratory: Has wheezing bilaterally GI: Soft, nondistended, no rebound pain, no organomegaly, BS present. Pt has a large ventral hernia with tenderness GU: No hematuria Ext: No pitting leg edema bilaterally. 2+DP/PT pulse bilaterally. Musculoskeletal: No joint deformities, No joint redness or warmth, no limitation of ROM in spin. Skin: No rashes.  Neuro: Alert, oriented X3, cranial nerves II-XII grossly intact,  moves all extremities normally. Psych: Patient is not psychotic, no suicidal or hemocidal ideation.  Labs on Admission: I have personally reviewed following labs and imaging studies  CBC: Recent Labs  Lab 07/22/20 0553  WBC 9.5  HGB 14.7  HCT 44.3  MCV 87.2  PLT 357   Basic Metabolic Panel: Recent Labs  Lab 07/22/20 0553  NA 142  K 4.0  CL 108  CO2 25  GLUCOSE 123*  BUN 33*  CREATININE 1.55*  CALCIUM 8.7*   GFR: Estimated Creatinine Clearance: 54 mL/min (A) (by C-G formula based on SCr of 1.55 mg/dL (H)). Liver Function Tests: Recent Labs  Lab 07/22/20 0553  AST 22  ALT 34  ALKPHOS 79  BILITOT 0.5  PROT 6.7  ALBUMIN 3.7   No results for input(s): LIPASE, AMYLASE in the last 168 hours. No results for input(s): AMMONIA in the last 168 hours. Coagulation Profile: No  results for input(s): INR, PROTIME in the last 168 hours. Cardiac Enzymes: No results for input(s): CKTOTAL, CKMB, CKMBINDEX, TROPONINI in the last 168 hours. BNP (last 3 results) No results for input(s): PROBNP in the last 8760 hours. HbA1C: No results for input(s): HGBA1C in the last 72 hours. CBG: Recent Labs  Lab 07/22/20 0805 07/22/20 1228  GLUCAP 147* 183*   Lipid Profile: No results for input(s): CHOL, HDL, LDLCALC, TRIG, CHOLHDL, LDLDIRECT in the last 72 hours. Thyroid Function Tests: No results for input(s): TSH, T4TOTAL, FREET4, T3FREE, THYROIDAB in the last 72 hours. Anemia Panel: No results for input(s): VITAMINB12, FOLATE, FERRITIN, TIBC, IRON, RETICCTPCT in the last 72 hours. Urine analysis:    Component Value Date/Time   COLORURINE YELLOW (A) 09/10/2019 0323   APPEARANCEUR HAZY (A) 09/10/2019 0323   APPEARANCEUR Hazy 01/03/2013 0528   LABSPEC 1.030 09/10/2019 0323   LABSPEC 1.023 01/03/2013 0528   PHURINE 7.0 09/10/2019 0323   GLUCOSEU NEGATIVE 09/10/2019 0323   GLUCOSEU >=500 01/03/2013 0528   HGBUR NEGATIVE 09/10/2019 0323   BILIRUBINUR NEGATIVE 09/10/2019 0323    BILIRUBINUR Negative 01/03/2013 0528   KETONESUR NEGATIVE 09/10/2019 0323   PROTEINUR NEGATIVE 09/10/2019 0323   NITRITE NEGATIVE 09/10/2019 0323   LEUKOCYTESUR NEGATIVE 09/10/2019 0323   LEUKOCYTESUR Negative 01/03/2013 0528   Sepsis Labs: @LABRCNTIP (procalcitonin:4,lacticidven:4) ) Recent Results (from the past 240 hour(s))  SARS Coronavirus 2 by RT PCR (hospital order, performed in [facility] hospital lab) Nasopharyngeal Nasopharyngeal Swab     Status: None   Collection Time: 07/22/20  6:22 AM   Specimen: Nasopharyngeal Swab  Result Value Ref Range Status   SARS Coronavirus 2 NEGATIVE NEGATIVE Final    Comment: (NOTE) SARS-CoV-2 target nucleic acids are NOT DETECTED.  The SARS-CoV-2 RNA is generally detectable in upper and lower respiratory specimens during the acute phase of infection. The lowest concentration of SARS-CoV-2 viral copies this assay can detect is 250 copies / mL. A negative result does not preclude SARS-CoV-2 infection and should not be used as the sole basis for treatment or other patient management decisions.  A negative result may occur with improper specimen collection / handling, submission of specimen other than nasopharyngeal swab, presence of viral mutation(s) within the areas targeted by this assay, and inadequate number of viral copies (<250 copies / mL). A negative result must be combined with clinical observations, patient history, and epidemiological information.  Fact Sheet for Patients:   09/21/20  Fact Sheet for Healthcare Providers: BoilerBrush.com.cy  This test is not yet approved or  cleared by the https://pope.com/ FDA and has been authorized for detection and/or diagnosis of SARS-CoV-2 by FDA under an Emergency Use Authorization (EUA).  This EUA will remain in effect (meaning this test can be used) for the duration of the COVID-19 declaration under Section 564(b)(1) of the Act, 21  U.S.C. section 360bbb-3(b)(1), unless the authorization is terminated or revoked sooner.  Performed at [facility], [address] [place], [place]      Radiological Exams on Admission: DG Chest Portable 1 View  Result Date: 07/22/2020 CLINICAL DATA:  Shortness of breath. EXAM: PORTABLE CHEST 1 VIEW COMPARISON:  02/25/2020 FINDINGS: The cardiac silhouette, mediastinal and hilar contours are within normal limits. The lungs are clear. No pleural effusion. No pulmonary lesions. The bony thorax is intact. IMPRESSION: No acute cardiopulmonary findings. Electronically Signed   By: 02/27/2020 M.D.   On: 07/22/2020 06:14     EKG: Independently reviewed.  Sinus rhythm,  QTC 456, LAE, nonspecific T wave change   Assessment/Plan Active Problems:   Acute on chronic respiratory failure with hypoxemia (HCC)   COPD exacerbation (HCC)   Type II diabetes mellitus with renal manifestations (HCC)   GERD (gastroesophageal reflux disease)   Hypertensive urgency   CKD (chronic kidney disease), stage IIIa   HTN (hypertension)   Ventral hernia     Acute on chronic respiratory failure with hypoxemia due to COPD exacerbation (HCC): -will admit to progressive unit as inpatient - on BiPAP -Bronchodilators -Solu-Medrol 60 mg IV tid -Z pak  -Singulair -Mucinex for cough  -Incentive spirometry -Follow up sputum culture -Nasal cannula oxygen as needed to maintain O2 saturation 93% or greater when pt is off BiPAP  Type II diabetes mellitus with renal manifestations (HCC): Recent A1c 7.7, poorly controlled.  Patient is taking Metformin -SSI  GERD (gastroesophageal reflux disease) -Protonix  Hypertensive urgency and HTN: Bp 204/129.  -As needed hydralazine IV -Continue Hyzaar  CKD (chronic kidney disease), stage IIIa: stable -Follow-up by BMP  Ventral hernia: -will get CT-abd/pelvis. If has complications, will consult surgeon         DVT ppx:  SQ Lovenox Code  Status: Full code Family Communication: not done, no family member is at bed side.  Disposition Plan:  Anticipate discharge back to previous environment Consults called:  nopne Admission status:  progressive unit as inpt      Status is: Inpatient  Remains inpatient appropriate because:Inpatient level of care appropriate due to severity of illness.  Patient has multiple comorbidities, now presents with acute on chronic respiratory failure with hypoxia, needs BiPAP.  Patient also has hypertensive urgency.  Her presentation is highly complicated.  Patient is at high risk of deteriorating.  Will need to be treated in hospital for at least 2 days.   Dispo: The patient is from: Home              Anticipated d/c is to: Home              Anticipated d/c date is: 2 days              Patient currently is not medically stable to d/c.         Date of Service 07/22/2020    Lorretta Harp [facility]   If 7PM-7AM, please contact night-coverage [email or web address] 07/22/2020, 4:03 PM

## 2020-07-22 NOTE — ED Notes (Signed)
Pt wanting to leave. MD messaged.

## 2020-07-23 DIAGNOSIS — K439 Ventral hernia without obstruction or gangrene: Secondary | ICD-10-CM

## 2020-07-23 DIAGNOSIS — J9621 Acute and chronic respiratory failure with hypoxia: Secondary | ICD-10-CM

## 2020-07-23 DIAGNOSIS — J441 Chronic obstructive pulmonary disease with (acute) exacerbation: Principal | ICD-10-CM

## 2020-07-23 LAB — CBC
HCT: 43 % (ref 36.0–46.0)
Hemoglobin: 13.9 g/dL (ref 12.0–15.0)
MCH: 28.4 pg (ref 26.0–34.0)
MCHC: 32.3 g/dL (ref 30.0–36.0)
MCV: 87.8 fL (ref 80.0–100.0)
Platelets: 359 10*3/uL (ref 150–400)
RBC: 4.9 MIL/uL (ref 3.87–5.11)
RDW: 13.4 % (ref 11.5–15.5)
WBC: 10.5 10*3/uL (ref 4.0–10.5)
nRBC: 0 % (ref 0.0–0.2)

## 2020-07-23 LAB — BASIC METABOLIC PANEL WITH GFR
Anion gap: 10 (ref 5–15)
BUN: 27 mg/dL — ABNORMAL HIGH (ref 6–20)
CO2: 24 mmol/L (ref 22–32)
Calcium: 9.4 mg/dL (ref 8.9–10.3)
Chloride: 104 mmol/L (ref 98–111)
Creatinine, Ser: 1.18 mg/dL — ABNORMAL HIGH (ref 0.44–1.00)
GFR calc Af Amer: >60 mL/min (ref >60)
GFR calc non Af Amer: 52 mL/min — ABNORMAL LOW (ref >60)
Glucose, Bld: 193 mg/dL — ABNORMAL HIGH (ref 70–99)
Potassium: 4 mmol/L (ref 3.5–5.1)
Sodium: 138 mmol/L (ref 135–145)

## 2020-07-23 LAB — GLUCOSE, CAPILLARY
Glucose-Capillary: 206 mg/dL — ABNORMAL HIGH (ref 70–99)
Glucose-Capillary: 307 mg/dL — ABNORMAL HIGH (ref 70–99)
Glucose-Capillary: 202 mg/dL — ABNORMAL HIGH (ref 70–99)
Glucose-Capillary: 259 mg/dL — ABNORMAL HIGH (ref 70–99)

## 2020-07-23 LAB — HIV ANTIBODY (ROUTINE TESTING W REFLEX): HIV Screen 4th Generation wRfx: NONREACTIVE

## 2020-07-23 MED ORDER — ENSURE MAX PROTEIN PO LIQD
11.0000 [oz_av] | Freq: Two times a day (BID) | ORAL | Status: DC
  Administered 2020-07-23 – 2020-07-24 (×3): 11 [oz_av] via ORAL
  Filled 2020-07-23: qty 330

## 2020-07-23 MED ORDER — TIOTROPIUM BROMIDE MONOHYDRATE 18 MCG IN CAPS
18.0000 ug | ORAL_CAPSULE | Freq: Every day | RESPIRATORY_TRACT | Status: DC
  Administered 2020-07-24: 18 ug via RESPIRATORY_TRACT
  Filled 2020-07-23: qty 5

## 2020-07-23 MED ORDER — DILTIAZEM HCL 30 MG PO TABS
30.0000 mg | ORAL_TABLET | Freq: Two times a day (BID) | ORAL | Status: DC
  Administered 2020-07-23 – 2020-07-24 (×2): 30 mg via ORAL
  Filled 2020-07-23 (×2): qty 1

## 2020-07-23 NOTE — Plan of Care (Signed)
  Problem: Education: Goal: Knowledge of General Education information will improve Description: Including pain rating scale, medication(s)/side effects and non-pharmacologic comfort measures Outcome: Progressing   Problem: Health Behavior/Discharge Planning: Goal: Ability to manage health-related needs will improve Outcome: Progressing   Problem: Clinical Measurements: Goal: Ability to maintain clinical measurements within normal limits will improve Outcome: Progressing   Problem: Education: Goal: Knowledge of disease or condition will improve Outcome: Progressing Goal: Knowledge of the prescribed therapeutic regimen will improve Outcome: Progressing

## 2020-07-23 NOTE — Plan of Care (Signed)

## 2020-07-23 NOTE — Progress Notes (Signed)
PROGRESS NOTE    Samantha [name]  HUD:[id number] DOB: [birth year] DOA: 07/22/2020 [facility]    Assessment & Plan:   Active Problems:   Acute on chronic respiratory failure with hypoxemia (HCC)   COPD exacerbation (HCC)   Type II diabetes mellitus with renal manifestations (HCC)   GERD (gastroesophageal reflux disease)   Hypertensive urgency   CKD (chronic kidney disease), stage IIIa   HTN (hypertension)   Ventral hernia   Acute on chronic hypoxic respiratory failure: due to COPD exacerbation. Continue on azithromycin, solumedrol, bronchodilators. Continue on BiPAP and wean as tolerated. Encourage incentive spirometry   COPD exacerbation: continue on azithromycin, solumedrol, bronchodilators. Encourage incentive spirometry. Continue on supplemental oxygen   Cocaine addiction:  has been addict since 54 yrs old as per pt. Illicit drug use cessation counseling.  Ventral hernia: ventral supraumbilical hernia containing a segment of transverse colon & no associated obstruction or inflammatory changes of the herniated segment of the colon as per CT abd/pelvis. W/ a lot of abd pain so will consult general surg   DM2: recent A1c 7.7, poorly controlled. Hold home dose of metformin. 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 Continue on home dose of gabapentin   GERD: continue on PPI   Hypertensive urgency: resolved  HTN: continue on losartan, HCTZ. IV hydralazine prn   CKDIIIa: stable. Avoid nephrotoxic meds    DVT prophylaxis: lovenox Code Status: full  Family Communication:  Disposition Plan: depends on PT/OT recs  Status is: Inpatient  Remains inpatient appropriate because:IV treatments appropriate due to intensity of illness or inability to take PO   Dispo: The patient is from: Home              Anticipated d/c is to: Home              Anticipated d/c date is: 3 days              Patient currently is not medically stable  to d/c.    Consultants:   General surg   Procedures:    Antimicrobials: azithromycin    Subjective: Pt c/o abd pain and shortness of breath   Objective: Vitals:   07/22/20 1749 07/22/20 1840 07/22/20 1948 07/23/20 0610  BP:  (!) 164/101 (!) 165/101 (!) 149/84  Pulse:  (!) 102 93 91  Resp:  19 (!) 21 (!) 22  Temp:  97.9 F (36.6 C) 98.2 F (36.8 C) 98.4 F (36.9 C)  TempSrc:  Oral Oral Oral  SpO2: 99% 96% 99% 98%  Weight:    128.5 kg  Height:        Intake/Output Summary (Last 24 hours) at 07/23/2020 0731 Last data filed at 07/23/2020 0700 Gross per 24 hour  Intake 0 ml  Output 1800 ml  Net -1800 ml   Filed Weights   07/22/20 0603 07/23/20 0610  Weight: 116.1 kg 128.5 kg    Examination:  General exam: Appears calm and comfortable  Respiratory system: course breath sounds b/l Cardiovascular system: S1 & S2+. No  rubs, gallops or clicks.  Gastrointestinal system: Abdomen is obese, soft and nontender. Hypoactive bowel sounds heard. Ventral hernia present Central nervous system: Alert and oriented. Moves all 4 extremities  Psychiatry: Judgement and insight appear normal. Mood & affect appropriate.     Data Reviewed: I have personally reviewed following labs and imaging studies  CBC: Recent Labs  Lab 07/22/20 0553  WBC 9.5  HGB 14.7  HCT  44.3  MCV 87.2  PLT 357   Basic Metabolic Panel: Recent Labs  Lab 07/22/20 0553  NA 142  K 4.0  CL 108  CO2 25  GLUCOSE 123*  BUN 33*  CREATININE 1.55*  CALCIUM 8.7*   GFR: Estimated Creatinine Clearance: 57.2 mL/min (A) (by C-G formula based on SCr of 1.55 mg/dL (H)). Liver Function Tests: Recent Labs  Lab 07/22/20 0553  AST 22  ALT 34  ALKPHOS 79  BILITOT 0.5  PROT 6.7  ALBUMIN 3.7   No results for input(s): LIPASE, AMYLASE in the last 168 hours. No results for input(s): AMMONIA in the last 168 hours. Coagulation Profile: No results for input(s): INR, PROTIME in the last 168 hours. Cardiac  Enzymes: No results for input(s): CKTOTAL, CKMB, CKMBINDEX, TROPONINI in the last 168 hours. BNP (last 3 results) No results for input(s): PROBNP in the last 8760 hours. HbA1C: No results for input(s): HGBA1C in the last 72 hours. CBG: Recent Labs  Lab 07/22/20 0805 07/22/20 1228 07/22/20 1700 07/22/20 1838 07/22/20 2117  GLUCAP 147* 183* 278* 222* 221*   Lipid Profile: No results for input(s): CHOL, HDL, LDLCALC, TRIG, CHOLHDL, LDLDIRECT in the last 72 hours. Thyroid Function Tests: No results for input(s): TSH, T4TOTAL, FREET4, T3FREE, THYROIDAB in the last 72 hours. Anemia Panel: No results for input(s): VITAMINB12, FOLATE, FERRITIN, TIBC, IRON, RETICCTPCT in the last 72 hours. Sepsis Labs: No results for input(s): PROCALCITON, LATICACIDVEN in the last 168 hours.  Recent Results (from the past 240 hour(s))  SARS Coronavirus 2 by RT PCR (hospital order, performed in [facility] hospital lab) Nasopharyngeal Nasopharyngeal Swab     Status: None   Collection Time: 07/22/20  6:22 AM   Specimen: Nasopharyngeal Swab  Result Value Ref Range Status   SARS Coronavirus 2 NEGATIVE NEGATIVE Final    Comment: (NOTE) SARS-CoV-2 target nucleic acids are NOT DETECTED.  The SARS-CoV-2 RNA is generally detectable in upper and lower respiratory specimens during the acute phase of infection. The lowest concentration of SARS-CoV-2 viral copies this assay can detect is 250 copies / mL. A negative result does not preclude SARS-CoV-2 infection and should not be used as the sole basis for treatment or other patient management decisions.  A negative result may occur with improper specimen collection / handling, submission of specimen other than nasopharyngeal swab, presence of viral mutation(s) within the areas targeted by this assay, and inadequate number of viral copies (<250 copies / mL). A negative result must be combined with clinical observations, patient history, and epidemiological  information.  Fact Sheet for Patients:   BoilerBrush.com.cy  Fact Sheet for Healthcare Providers: https://pope.com/  This test is not yet approved or  cleared by the [place] FDA and has been authorized for detection and/or diagnosis of SARS-CoV-2 by FDA under an Emergency Use Authorization (EUA).  This EUA will remain in effect (meaning this test can be used) for the duration of the COVID-19 declaration under Section 564(b)(1) of the Act, 21 U.S.C. section 360bbb-3(b)(1), unless the authorization is terminated or revoked sooner.  Performed at [facility], [address], [place], [place] [address]          Radiology Studies: CT ABDOMEN PELVIS WO CONTRAST  Result Date: 07/22/2020 CLINICAL DATA:  54 year old female with abdominal pain. Concern for hernia. EXAM: CT ABDOMEN AND PELVIS WITHOUT CONTRAST TECHNIQUE: Multidetector CT imaging of the abdomen and pelvis was performed following the standard protocol without IV contrast. COMPARISON:  CT abdomen pelvis dated 09/10/2019.  FINDINGS: Evaluation of this exam is limited in the absence of intravenous contrast. Lower chest: There are minimal bibasilar atelectasis. No intra-abdominal free air or free fluid. Hepatobiliary: There is fatty infiltration of the liver. No intrahepatic biliary dilatation. Cholecystectomy. Pancreas: Unremarkable. No pancreatic ductal dilatation or surrounding inflammatory changes. Spleen: Normal in size without focal abnormality. Adrenals/Urinary Tract: The adrenal glands unremarkable. Mild bilateral renal atrophy and cortical irregularity and lobulation. There is no hydronephrosis or nephrolithiasis on either side. The visualized ureters and urinary bladder appear unremarkable. Stomach/Bowel: There is herniation of a segment of the transverse colon into a ventral supraumbilical hernia. No associated obstruction. There is minimal stranding of the fat within  the hernia, possibly chronic. No significant inflammatory changes. There is colonic diverticulosis. There is focal inflammatory changes of the transverse colon just proximal to the neck of the hernia which may be reactive or represent acute diverticulitis. There is no bowel obstruction. The appendix is normal. Vascular/Lymphatic: Moderate aortoiliac atherosclerotic disease. The IVC is unremarkable. No portal venous gas. There is no adenopathy. Reproductive: The uterus and ovaries are grossly unremarkable. Other: The neck of the aforementioned large supraumbilical hernia which contains a segment of transverse colon measures approximately 6 cm in diameter. Additionally there is a smaller fat containing midline ventral supraumbilical hernia more superiorly anterior to the liver with small amount of fluid within the hernia. Slight interval decrease in the amount of fluid within the hernia compared to the prior CT. Musculoskeletal: Degenerative changes. No acute osseous pathology. Avascular necrosis of the femoral heads. IMPRESSION: 1. Ventral supraumbilical hernia containing a segment of the transverse colon. No associated obstruction. No significant inflammatory changes of the herniated segment of the colon. 2. Colonic diverticulosis. Focal inflammatory changes of the transverse colon just proximal to the neck of the hernia may be reactive or represent acute diverticulitis. No bowel obstruction. Normal appendix. 3. Fatty liver. 4. Aortic Atherosclerosis (ICD10-I70.0). Electronically Signed   By: Elgie Collard M.D.   On: 07/22/2020 19:03   DG Chest Portable 1 View  Result Date: 07/22/2020 CLINICAL DATA:  Shortness of breath. EXAM: PORTABLE CHEST 1 VIEW COMPARISON:  02/25/2020 FINDINGS: The cardiac silhouette, mediastinal and hilar contours are within normal limits. The lungs are clear. No pleural effusion. No pulmonary lesions. The bony thorax is intact. IMPRESSION: No acute cardiopulmonary findings.  Electronically Signed   By: Rudie Meyer M.D.   On: 07/22/2020 06:14        Scheduled Meds:  azithromycin  250 mg Oral Daily   enoxaparin (LOVENOX) injection  40 mg Subcutaneous Q12H   gabapentin  300 mg Oral TID   losartan  50 mg Oral Daily   And   hydrochlorothiazide  12.5 mg Oral Daily   insulin aspart  0-5 Units Subcutaneous QHS   insulin aspart  0-9 Units Subcutaneous TID WC   ipratropium-albuterol  3 mL Nebulization Q4H   methylPREDNISolone (SOLU-MEDROL) injection  60 mg Intravenous Q12H   mometasone-formoterol  2 puff Inhalation BID   montelukast  10 mg Oral QHS   nicotine  21 mg Transdermal Daily   pantoprazole  40 mg Oral Daily   sodium chloride flush  10 mL Intravenous Q12H   Continuous Infusions:   LOS: 1 day    Time spent: 35 mins     Charise Killian, MD [facility] Pager [phone]  If 7PM-7AM, please contact night-coverage [email or web address] 07/23/2020, 7:31 AM

## 2020-07-23 NOTE — Progress Notes (Signed)
Initial Nutrition Assessment  DOCUMENTATION CODES:   Morbid obesity  INTERVENTION:   Ensure Max protein supplement BID, each supplement provides 150kcal and 30g of protein.  Liberalize diet   NUTRITION DIAGNOSIS:   Increased nutrient needs related to catabolic illness (COPD) as evidenced by increased estimated needs. GOAL:   Patient will meet greater than or equal to 90% of their needs  MONITOR:   PO intake, Supplement acceptance, Labs, Weight trends, Skin, I & O's  REASON FOR ASSESSMENT:   Consult Assessment of nutrition requirement/status  ASSESSMENT:   54 y.o. female with medical history significant of hypertension, diabetes mellitus, COPD, asthma, GERD, ventral hernia, tobacco abuse, drug abuse, CKD stage III, who presents with shortness breath.  Met with pt in room today. Pt reports good appetite and oral intake at baseline but reports that she does not like the hospital food. Pt reports that she did not eat any breakfast this morning but she is planning to eat lunch. RD discussed with pt the importance of adequate nutrition needed to preserve lean muscle. RD also briefly discussed diabetes diet education with pt. Pt is willing to drink chocolate Ensure supplements while in hospital. Per chart, pt with weight gain pta.   Medications reviewed and include: azithromycin, lovenox, insulin, solu-medrol, nicotine, protonix  Labs reviewed: BUN 27(H), creat 1.18(H) cbgs- 206, 307 x 24 hrs  NUTRITION - FOCUSED PHYSICAL EXAM:    Most Recent Value  Orbital Region No depletion  Upper Arm Region No depletion  Thoracic and Lumbar Region No depletion  Buccal Region No depletion  Temple Region No depletion  Clavicle Bone Region No depletion  Clavicle and Acromion Bone Region No depletion  Scapular Bone Region No depletion  Dorsal Hand No depletion  Patellar Region No depletion  Anterior Thigh Region No depletion  Posterior Calf Region No depletion  Edema (RD Assessment)  Mild  Hair Reviewed  Eyes Reviewed  Mouth Reviewed  Skin Reviewed  Nails Reviewed     Diet Order:   Diet Order            Diet Carb Modified Fluid consistency: Thin; Room service appropriate? Yes  Diet effective now                EDUCATION NEEDS:   Education needs have been addressed  Skin:  Skin Assessment: Reviewed RN Assessment  Last BM:  8/11  Height:   Ht Readings from Last 1 Encounters:  07/22/20 _0  (1.702 m)    Weight:   Wt Readings from Last 1 Encounters:  07/23/20 128.5 kg    Ideal Body Weight:  61.36 kg  BMI:  Body mass index is 44.36 kg/m.  Estimated Nutritional Needs:   Kcal:  2300-2600kcal/day  Protein:  115-130g/day  Fluid:  1.9-2.2L/day  Koleen Distance MS, RD, LDN Please refer to [facility] for RD and/or RD on-call/weekend/after hours pager

## 2020-07-23 NOTE — Consult Note (Signed)
SURGICAL CONSULTATION NOTE   HISTORY OF PRESENT ILLNESS (HPI):  54 y.o. female [facility] for evaluation of shortness of breath. Patient reports feeling short of breath since 2 to 3 days ago.  She was admitted by hospitalist for COPD exacerbation.  Patient has been complaining of abdominal pain due to ventral hernia.  She reported the ventral hernia has been there for more than 5 years.  The patient reported that the pain is in the mid abdomen.  There is no bed radiation but she reports that the pain makes her COPD worse.  There is no alleviating or aggravating factor.  Of note patient has history of severe COPD poorly controlled, poorly controlled diabetes without any follow-up by PCP, morbid obesity of 44 BMI, smoker actively, positive for cocaine.  She had CT scan done showing supraumbilical hernia with herniated colon without obstruction.  She also had an small epigastric hernia with fat content. I personally evaluated the images.   Surgery is consulted by Dr. Mayford KnifeWilliams in this context for evaluation and management of ventral hernia.  PAST MEDICAL HISTORY (PMH):  Past Medical History:  Diagnosis Date  . Acute on chronic respiratory failure with hypoxemia (HCC) 08/30/2015  . Asthma   . COPD (chronic obstructive pulmonary disease) (HCC)   . Diabetes mellitus without complication (HCC)   . Hypertension      PAST SURGICAL HISTORY (PSH):  Past Surgical History:  Procedure Laterality Date  . none       MEDICATIONS:  Prior to Admission medications   Medication Sig Start Date End Date Taking? Authorizing Provider  albuterol (PROVENTIL HFA) 108 (90 Base) MCG/ACT inhaler Inhale 2 puffs into the lungs every 4 (four) hours as needed for wheezing or shortness of breath. 11/16/19  Yes Sharman CheekStafford, Phillip, MD  budesonide-formoterol [facility](SYMBICORT) 160-4.5 MCG/ACT inhaler Inhale 2 puffs into the lungs 2 (two) times daily. 01/22/20  Yes Veronese, [place], MD  gabapentin (NEURONTIN) 300 MG  capsule Take 300 mg by mouth 3 (three) times daily.    Yes [provider]  ipratropium-albuterol (DUONEB) 0.5-2.5 (3) MG/3ML SOLN Take 3 mLs by nebulization every 6 (six) hours as needed (shortness of breath).   Yes [provider]  losartan-hydrochlorothiazide (HYZAAR) 50-12.5 MG tablet Take 1 tablet by mouth daily. 02/25/20  Yes Sharman CheekStafford, Phillip, MD  metFORMIN (GLUCOPHAGE) 850 MG tablet Take 850 mg by mouth 2 (two) times daily with a meal.   Yes [provider]  montelukast (SINGULAIR) 10 MG tablet Take 10 mg by mouth at bedtime.    Yes [provider]  omeprazole (PRILOSEC) 20 MG capsule Take 20 mg by mouth daily.  05/05/19  Yes [provider]  tiotropium (SPIRIVA) 18 MCG inhalation capsule Place 18 mcg into inhaler and inhale daily.   Yes [provider]  guaiFENesin (ROBITUSSIN) 100 MG/5ML SOLN Take 5 mLs (100 mg total) by mouth every 4 (four) hours as needed for cough or to loosen phlegm. 02/25/20   Sharman CheekStafford, Phillip, MD     ALLERGIES:  Allergies  Allergen Reactions  . Percocet [Oxycodone-Acetaminophen] Itching     SOCIAL HISTORY:  Social History   Socioeconomic History  . Marital status: Divorced    Spouse name: Not on file  . Number of children: Not on file  . Years of education: Not on file  . Highest education level: Not on file  Occupational History  . Not on file  Tobacco Use  . Smoking status: Current Every Day Smoker  Packs/day: 0.10    Years: 23.00    Pack years: 2.30    Types: Cigarettes  . Smokeless tobacco: Never Used  Vaping Use  . Vaping Use: Never used  Substance and Sexual Activity  . Alcohol use: Yes    Alcohol/week: 1.0 standard drink    Types: 1 Cans of beer per week  . Drug use: Yes    Types: Cocaine    Comment: x 1 week ago  . Sexual activity: Not on file  Other Topics Concern  . Not on file  Social History Narrative   ** Merged History Encounter **       Social Determinants of Health    Financial Resource Strain:   . Difficulty of Paying Living Expenses:   Food Insecurity:   . Worried About Programme researcher, broadcasting/film/video in the Last Year:   . Barista in the Last Year:   Transportation Needs:   . Freight forwarder (Medical):   Marland Kitchen Lack of Transportation (Non-Medical):   Physical Activity:   . Days of Exercise per Week:   . Minutes of Exercise per Session:   Stress:   . Feeling of Stress :   Social Connections:   . Frequency of Communication with Friends and Family:   . Frequency of Social Gatherings with Friends and Family:   . Attends Religious Services:   . Active Member of Clubs or Organizations:   . Attends Banker Meetings:   Marland Kitchen Marital Status:   Intimate Partner Violence:   . Fear of Current or Ex-Partner:   . Emotionally Abused:   Marland Kitchen Physically Abused:   . Sexually Abused:       FAMILY HISTORY:  Family History  Problem Relation Age of Onset  . Asthma Mother   . Diabetes Mother   . Hypertension Mother   . Asthma Sister   . Heart attack Father      REVIEW OF SYSTEMS:  Constitutional: denies weight loss, fever, chills, or sweats  Eyes: denies any other vision changes, history of eye injury  ENT: denies sore throat, hearing problems  Respiratory: positive shortness of breath, wheezing  Cardiovascular: denies chest pain, palpitations  Gastrointestinal: positive abdominal pain, nausea and vomiting Genitourinary: denies burning with urination or urinary frequency Musculoskeletal: denies any other joint pains or cramps  Skin: denies any other rashes or skin discolorations  Neurological: denies any other headache, dizziness, weakness  Psychiatric: denies any other depression, anxiety   All other review of systems were negative   VITAL SIGNS:  Temp:  [97.8 F (36.6 C)-98.4 F (36.9 C)] 97.9 F (36.6 C) (08/12 1119) Pulse Rate:  [88-107] 98 (08/12 1119) Resp:  [14-22] 17 (08/12 1119) BP: (149-206)/(84-111) 175/90 (08/12  1119) SpO2:  [95 %-100 %] 95 % (08/12 1119) Weight:  [128.5 kg] 128.5 kg (08/12 0610)     Height: 5\' 7"  (170.2 cm) Weight: 128.5 kg BMI (Calculated): 44.35   INTAKE/OUTPUT:  This shift: Total I/O In: 240 [P.O.:240] Out: 300 [Urine:300]  Last 2 shifts: @IOLAST2SHIFTS @   PHYSICAL EXAM:  Constitutional:  -- Morbid obese  -- Awake, alert, and oriented x3  Eyes:  -- Pupils equally round and reactive to light  -- No scleral icterus  Ear, nose, and throat:  -- No jugular venous distension  Pulmonary:  -- bilateral crackles Cardiovascular:  -- S1, S2 present  -- No pericardial rubs Gastrointestinal:  -- Abdomen soft, nontender, non-distended, no guarding or rebound tenderness -- Soft ventral hernia,  reducible Musculoskeletal and Integumentary:  -- Wounds: None appreciated -- Extremities: B/L UE and LE FROM, hands and feet warm, no edema  Neurologic:  -- Motor function: intact and symmetric -- Sensation: intact and symmetric   Labs:  CBC Latest Ref Rng & Units 07/23/2020 07/22/2020 02/25/2020  WBC 4.0 - 10.5 K/uL 10.5 9.5 8.5  Hemoglobin 12.0 - 15.0 g/dL 78.5 88.5 02.7  Hematocrit 36 - 46 % 43.0 44.3 44.4  Platelets 150 - 400 K/uL 359 357 323   CMP Latest Ref Rng & Units 07/23/2020 07/22/2020 02/25/2020  Glucose 70 - 99 mg/dL 741(O) 878(M) 767(M)  BUN 6 - 20 mg/dL 09(O) 70(J) 62(E)  Creatinine 0.44 - 1.00 mg/dL 3.66(Q) 9.47(M) 5.46(T)  Sodium 135 - 145 mmol/L 138 142 137  Potassium 3.5 - 5.1 mmol/L 4.0 4.0 4.1  Chloride 98 - 111 mmol/L 104 108 105  CO2 22 - 32 mmol/L 24 25 24   Calcium 8.9 - 10.3 mg/dL 9.4 ) 8.9  Total Protein 6.5 - 8.1 g/dL - 6.7 -  Total Bilirubin 0.3 - 1.2 mg/dL - 0.5 -  Alkaline Phos 38 - 126 U/L - 79 -  AST 15 - 41 U/L - 22 -  ALT 0 - 44 U/L - 34 -    Imaging studies:  EXAM: CT ABDOMEN AND PELVIS WITHOUT CONTRAST  TECHNIQUE: Multidetector CT imaging of the abdomen and pelvis was performed following the standard protocol without IV  contrast.  COMPARISON:  CT abdomen pelvis dated 09/10/2019.  FINDINGS: Evaluation of this exam is limited in the absence of intravenous contrast.  Lower chest: There are minimal bibasilar atelectasis.  No intra-abdominal free air or free fluid.  Hepatobiliary: There is fatty infiltration of the liver. No intrahepatic biliary dilatation. Cholecystectomy.  Pancreas: Unremarkable. No pancreatic ductal dilatation or surrounding inflammatory changes.  Spleen: Normal in size without focal abnormality.  Adrenals/Urinary Tract: The adrenal glands unremarkable. Mild bilateral renal atrophy and cortical irregularity and lobulation. There is no hydronephrosis or nephrolithiasis on either side. The visualized ureters and urinary bladder appear unremarkable.  Stomach/Bowel: There is herniation of a segment of the transverse colon into a ventral supraumbilical hernia. No associated obstruction. There is minimal stranding of the fat within the hernia, possibly chronic. No significant inflammatory changes. There is colonic diverticulosis. There is focal inflammatory changes of the transverse colon just proximal to the neck of the hernia which may be reactive or represent acute diverticulitis. There is no bowel obstruction. The appendix is normal.  Vascular/Lymphatic: Moderate aortoiliac atherosclerotic disease. The IVC is unremarkable. No portal venous gas. There is no adenopathy.  Reproductive: The uterus and ovaries are grossly unremarkable.  Other: The neck of the aforementioned large supraumbilical hernia which contains a segment of transverse colon measures approximately 6 cm in diameter.  Additionally there is a smaller fat containing midline ventral supraumbilical hernia more superiorly anterior to the liver with small amount of fluid within the hernia. Slight interval decrease in the amount of fluid within the hernia compared to the prior CT.  Musculoskeletal:  Degenerative changes. No acute osseous pathology. Avascular necrosis of the femoral heads.  IMPRESSION: 1. Ventral supraumbilical hernia containing a segment of the transverse colon. No associated obstruction. No significant inflammatory changes of the herniated segment of the colon. 2. Colonic diverticulosis. Focal inflammatory changes of the transverse colon just proximal to the neck of the hernia may be reactive or represent acute diverticulitis. No bowel obstruction. Normal appendix. 3. Fatty liver. 4. Aortic Atherosclerosis (ICD10-I70.0).  Electronically Signed   By: Elgie Collard M.D.   On: 07/22/2020 19:03  Assessment/Plan:  54 y.o. female consulted for evaluation of ventral hernia, complicated by pertinent comorbidities including severe COPD chronically on exacerbation that she cannot even talk in complete sentences at this moment, smoker, BMI of 54, uncontrolled diabetes, illicit drug user.  Patient was evaluated due to abdominal pain due to ventral hernia.  At this moment there are multiple contraindication for surgical management on this patient.  This patient would not tolerate general anesthesia under her current severe COPD exacerbation.  She is also positive for cocaine which make her high risk for death on the general anesthesia.  She is a smoker, has uncontrolled diabetes and has a BMI of 44 which make her recurrence rate very high.  Patient has multiple prohibitive contraindications of general anesthesia and ventral hernia repair.  I discussed all these contraindication with the patient and I told her that if she starts smoking, if she lose weight, if she controlled diabetes and if she controlled her COPD repair of her hernia can be considered but if she continue with all these high risk factors she will not be a candidate for surgical management.  This patient does not have a PCP to follow her diabetes and her obesity.  She should continue following with  pulmonologist for better control for her COPD and smoking cessation.  She may also need psychiatry evaluation for assistant in the management of addiction  Gae Gallop, MD

## 2020-07-23 NOTE — Progress Notes (Signed)
Cross  Cover Brief Note Patient started on oral short acting cardizem due to ongoing tachycardia and hypertension

## 2020-07-23 NOTE — Progress Notes (Signed)
Inpatient Diabetes Program Recommendations  AACE/ADA: New Consensus Statement on Inpatient Glycemic Control   Target Ranges:  Prepandial:   less than 140 mg/dL      Peak postprandial:   less than 180 mg/dL (1-2 hours)      Critically ill patients:  140 - 180 mg/dL   Results for POTENZA, Conley A (MRN [id number]) as of 07/23/2020 12:24  Ref. Range 07/22/2020 08:05 07/22/2020 12:28 07/22/2020 17:00 07/22/2020 18:38 07/22/2020 21:17 07/23/2020 07:55 07/23/2020 11:16  Glucose-Capillary Latest Ref Range: 70 - 99 mg/dL 888 (H) 916 (H) 945 (H) 222 (H) 221 (H) 206 (H) 307 (H)   Review of Glycemic Control  Diabetes history: DM2 Outpatient Diabetes medications: Metformin 850 mg BID Current orders for Inpatient glycemic control: Novolog 0-9 units TID with meals, Novolog 0-5 units QHS; Solumedrol 60 mg Q12H  Inpatient Diabetes Program Recommendations:    Insulin-If steroids are continued, please consider Novolog 5 units TID with meals for meal coverage if patient eats at least 50% of meals.  Thanks, Orlando Penner, RN, MSN, CDE Diabetes Coordinator Inpatient Diabetes Program [phone] (Team Pager from 8am to 5pm)

## 2020-07-24 LAB — BASIC METABOLIC PANEL
Anion gap: 11 (ref 5–15)
BUN: 35 mg/dL — ABNORMAL HIGH (ref 6–20)
CO2: 23 mmol/L (ref 22–32)
Calcium: 9.5 mg/dL (ref 8.9–10.3)
Chloride: 105 mmol/L (ref 98–111)
Creatinine, Ser: 1.19 mg/dL — ABNORMAL HIGH (ref 0.44–1.00)
GFR calc Af Amer: 60 mL/min — ABNORMAL LOW (ref >60)
GFR calc non Af Amer: 51 mL/min — ABNORMAL LOW (ref >60)
Glucose, Bld: 187 mg/dL — ABNORMAL HIGH (ref 70–99)
Potassium: 4 mmol/L (ref 3.5–5.1)
Sodium: 139 mmol/L (ref 135–145)

## 2020-07-24 LAB — CBC
HCT: 45.6 % (ref 36.0–46.0)
Hemoglobin: 15.2 g/dL — ABNORMAL HIGH (ref 12.0–15.0)
MCH: 28.8 pg (ref 26.0–34.0)
MCHC: 33.3 g/dL (ref 30.0–36.0)
MCV: 86.5 fL (ref 80.0–100.0)
Platelets: 376 10*3/uL (ref 150–400)
RBC: 5.27 MIL/uL — ABNORMAL HIGH (ref 3.87–5.11)
RDW: 13.8 % (ref 11.5–15.5)
WBC: 11 10*3/uL — ABNORMAL HIGH (ref 4.0–10.5)
nRBC: 0 % (ref 0.0–0.2)

## 2020-07-24 LAB — GLUCOSE, CAPILLARY: Glucose-Capillary: 212 mg/dL — ABNORMAL HIGH (ref 70–99)

## 2020-07-24 MED ORDER — IPRATROPIUM-ALBUTEROL 0.5-2.5 (3) MG/3ML IN SOLN: 3.0000 mL | Freq: Four times a day (QID) | RESPIRATORY_TRACT | 0 refills | Status: AC | PRN

## 2020-07-24 MED ORDER — AZITHROMYCIN 250 MG PO TABS: 250.0000 mg | ORAL_TABLET | Freq: Every day | ORAL | 0 refills | Status: AC

## 2020-07-24 MED ORDER — INSULIN ASPART 100 UNIT/ML ~~LOC~~ SOLN: 0.0000 [IU] | Freq: Three times a day (TID) | SUBCUTANEOUS | Status: DC

## 2020-07-24 MED ORDER — ALBUTEROL SULFATE (2.5 MG/3ML) 0.083% IN NEBU
2.5000 mg | INHALATION_SOLUTION | RESPIRATORY_TRACT | 2 refills | Status: AC | PRN
Start: 2020-07-24 — End: 2020-08-23

## 2020-07-24 MED ORDER — BUDESONIDE-FORMOTEROL FUMARATE 160-4.5 MCG/ACT IN AERO: 2.0000 | INHALATION_SPRAY | Freq: Two times a day (BID) | RESPIRATORY_TRACT | 0 refills | Status: AC

## 2020-07-24 MED ORDER — ALBUTEROL SULFATE HFA 108 (90 BASE) MCG/ACT IN AERS
2.0000 | INHALATION_SPRAY | RESPIRATORY_TRACT | 0 refills | Status: AC | PRN
Start: 2020-07-24 — End: 2020-08-23

## 2020-07-24 MED ORDER — INSULIN ASPART 100 UNIT/ML ~~LOC~~ SOLN: 0.0000 [IU] | Freq: Every day | SUBCUTANEOUS | Status: DC

## 2020-07-24 MED ORDER — INSULIN ASPART 100 UNIT/ML ~~LOC~~ SOLN: 4.0000 [IU] | Freq: Three times a day (TID) | SUBCUTANEOUS | Status: DC

## 2020-07-24 MED ORDER — PREDNISONE 20 MG PO TABS: 40.0000 mg | ORAL_TABLET | Freq: Every day | ORAL | 0 refills | Status: AC

## 2020-07-24 NOTE — Progress Notes (Signed)
Pt refused cpap

## 2020-07-24 NOTE — Progress Notes (Addendum)
Pt states she wants to leave AMA, Dr. Mayford Knife at bedside and aware. Pt upset because MD informed pt she has to stay one more night. Pt asking for taxi boucher. Informed pt she can not have one since she is leaving AMA. Tele box and IV removed. Advised pt to follow up closely with Dr. Mayo Ao.Pt escorted downstairs via wheelchair.

## 2020-07-24 NOTE — Progress Notes (Signed)
   07/24/20 0028  Assess: MEWS Score  BP (!) 177/97  Patient ambulated around the nursing desk X2. Patient's pulse ox remains at 94% while on 3liters of oxygen. Patient is dyspneic with exertion.

## 2020-07-24 NOTE — Progress Notes (Signed)
Inpatient Diabetes Program Recommendations  AACE/ADA: New Consensus Statement on Inpatient Glycemic Control (2015)  Target Ranges:  Prepandial:   less than 140 mg/dL      Peak postprandial:   less than 180 mg/dL (1-2 hours)      Critically ill patients:  140 - 180 mg/dL   Lab Results  Component Value Date   GLUCAP 212 (H) 07/24/2020   HGBA1C 7.7 (H) 10/06/2015    Review of Glycemic Control Results for Samantha [name], Samantha [name] (MRN [id number]) as of 07/24/2020 08:58  Ref. Range 07/23/2020 11:16 07/23/2020 16:37 07/23/2020 21:55 07/24/2020 08:22  Glucose-Capillary Latest Ref Range: 70 - 99 mg/dL 977 (H) 414 (H) 239 (H) 212 (H)   Diabetes history: DM2 Outpatient Diabetes medications: Metformin 850 mg BID Current orders for Inpatient glycemic control: Novolog 0-9 units TID with meals, Novolog 0-5 units QHS; Solumedrol 60 mg Q12H  Inpatient Diabetes Program Recommendations:    Insulin-If steroids are continued, please consider Novolog 4 units TID with meals for meal coverage if patient eats at least 50% of meals and increasing correction to Novolog 0-15 units TID.  Thanks, Lujean Rave, MSN, RNC-OB Diabetes Coordinator [phone] (8a-5p)

## 2020-07-24 NOTE — Discharge Summary (Addendum)
Physician Discharge Summary  Samantha [name] MWN:[id number] DOB: 1966-07-01 DOA: 07/22/2020  PCP: [facility]  Admit date: 07/22/2020 Discharge date: 07/24/2020  Admitted From: home Disposition:  Pt left AMA  Recommendations for Outpatient Follow-up:  1. Pt left AMA   Home Health: no  Equipment/Devices:  Discharge Condition: guarded  CODE STATUS: full  Diet recommendation: Heart Healthy / Carb Modified   Brief/Interim Summary: HPI was taken from Dr. Clyde Lundborg: Samantha [name] is a 54 y.o. female with medical history significant of hypertension, diabetes mellitus, COPD, asthma, GERD, ventral hernia, tobacco abuse, drug abuse, CKD stage III, who presents with shortness breath.  Patient states that she has shortness of breath for more than 2 days, which has been progressively worsening.  She also has cough and wheezing. She has some yellow-colored sputum production.  Denies chest pain, fever or chills.  Patient does not have nausea, vomiting, diarrhea.  No symptoms of UTI.  Patient states she has a chronic large ventral hernia, today she has moderate tenderness over the hernia.  ED Course: pt was found to have negative COVID-19 PCR, WBC 9.6, troponin 6, positive UDS for cocaine, renal function close to baseline, temperature 97.1, elevated blood pressure 204/129, heart rate 83, RR 14, oxygen saturation 99% on 8 L nasal cannula oxygen, BiPAP started in ED, chest x-ray negative patient is admitted to progressive bed as inpatient  Hospital course from Dr. Wilfred Lacy 8/12-8/13/21: Pt presented w/ shortness of breath secondary to acute COPD exacerbation. Pt was treated w/ IV azithromycin, steroids, bronchodilators, incentive spirometry and supplemental oxygen. Pt was on 4L on the day the pt left AMA which is higher than pt's baseline oxygen level. Furthermore, pt also c/o abd pain secondary ventral hernia that was not obstructed as per CT scan. General surg was consulted who stated that the pt  currently is not a  surgical candidate secondary chronic cocaine use, uncontrolled HTN & DM, current smoker & morbid obesity. Pt stated she felt better and was going home so pt left AMA.   Discharge Diagnoses:  Active Problems:   Acute on chronic respiratory failure with hypoxemia (HCC)   COPD exacerbation (HCC)   Type II diabetes mellitus with renal manifestations (HCC)   GERD (gastroesophageal reflux disease)   Hypertensive urgency   CKD (chronic kidney disease), stage IIIa   HTN (hypertension)   Ventral hernia   Acute on chronic hypoxic respiratory failure: due to COPD exacerbation. Continue on azithromycin, solumedrol, bronchodilators. Continue on supplemental oxygen & wean back to baseline. Encourage incentive spirometry   COPD exacerbation: continue on azithromycin, solumedrol, bronchodilators. Encourage incentive spirometry. Continue on supplemental oxygen   Cocaine addiction:  has been addict since 54 yrs old as per pt. Illicit drug use cessation counseling.  Ventral hernia: ventral supraumbilical hernia containing a segment of transverse colon & no associated obstruction or inflammatory changes of the herniated segment of the colon as per CT abd/pelvis. Not currently a surgical candidate as per gen surg   DM2: recent A1c 7.7, poorly controlled. Hold home dose of metformin. 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 Continue on home dose of gabapentin   GERD: continue on PPI   Hypertensive urgency: resolved  HTN: continue on losartan, HCTZ. IV hydralazine prn   CKDIIIa: stable. Avoid nephrotoxic meds   Discharge Instructions     Allergies  Allergen Reactions  . Percocet [Oxycodone-Acetaminophen] Itching    Consultations:  General surg    Procedures/Studies: CT ABDOMEN PELVIS  WO CONTRAST  Result Date: 07/22/2020 CLINICAL DATA:  54 year old female with abdominal pain. Concern for hernia. EXAM: CT ABDOMEN AND PELVIS WITHOUT  CONTRAST TECHNIQUE: Multidetector CT imaging of the abdomen and pelvis was performed following the standard protocol without IV contrast. COMPARISON:  CT abdomen pelvis dated 09/10/2019. FINDINGS: Evaluation of this exam is limited in the absence of intravenous contrast. Lower chest: There are minimal bibasilar atelectasis. No intra-abdominal free air or free fluid. Hepatobiliary: There is fatty infiltration of the liver. No intrahepatic biliary dilatation. Cholecystectomy. Pancreas: Unremarkable. No pancreatic ductal dilatation or surrounding inflammatory changes. Spleen: Normal in size without focal abnormality. Adrenals/Urinary Tract: The adrenal glands unremarkable. Mild bilateral renal atrophy and cortical irregularity and lobulation. There is no hydronephrosis or nephrolithiasis on either side. The visualized ureters and urinary bladder appear unremarkable. Stomach/Bowel: There is herniation of a segment of the transverse colon into a ventral supraumbilical hernia. No associated obstruction. There is minimal stranding of the fat within the hernia, possibly chronic. No significant inflammatory changes. There is colonic diverticulosis. There is focal inflammatory changes of the transverse colon just proximal to the neck of the hernia which may be reactive or represent acute diverticulitis. There is no bowel obstruction. The appendix is normal. Vascular/Lymphatic: Moderate aortoiliac atherosclerotic disease. The IVC is unremarkable. No portal venous gas. There is no adenopathy. Reproductive: The uterus and ovaries are grossly unremarkable. Other: The neck of the aforementioned large supraumbilical hernia which contains a segment of transverse colon measures approximately 6 cm in diameter. Additionally there is a smaller fat containing midline ventral supraumbilical hernia more superiorly anterior to the liver with small amount of fluid within the hernia. Slight interval decrease in the amount of fluid within the  hernia compared to the prior CT. Musculoskeletal: Degenerative changes. No acute osseous pathology. Avascular necrosis of the femoral heads. IMPRESSION: 1. Ventral supraumbilical hernia containing a segment of the transverse colon. No associated obstruction. No significant inflammatory changes of the herniated segment of the colon. 2. Colonic diverticulosis. Focal inflammatory changes of the transverse colon just proximal to the neck of the hernia may be reactive or represent acute diverticulitis. No bowel obstruction. Normal appendix. 3. Fatty liver. 4. Aortic Atherosclerosis (ICD10-I70.0). Electronically Signed   By: Elgie CollardArash  Radparvar M.D.   On: 07/22/2020 19:03   DG Chest Portable 1 View  Result Date: 07/22/2020 CLINICAL DATA:  Shortness of breath. EXAM: PORTABLE CHEST 1 VIEW COMPARISON:  02/25/2020 FINDINGS: The cardiac silhouette, mediastinal and hilar contours are within normal limits. The lungs are clear. No pleural effusion. No pulmonary lesions. The bony thorax is intact. IMPRESSION: No acute cardiopulmonary findings. Electronically Signed   By: Rudie MeyerP.  Gallerani M.D.   On: 07/22/2020 06:14       Subjective: Pt stated she felt better and was going home    Discharge Exam: Vitals:   07/24/20 0516 07/24/20 0822  BP: 138/81 (!) 148/95  Pulse: 89 95  Resp:  18  Temp: 97.8 F (36.6 C) 98.2 F (36.8 C)  SpO2: 100% 95%   Vitals:   07/24/20 0028 07/24/20 0053 07/24/20 0516 07/24/20 0822  BP: (!) 177/97 (!) 153/84 138/81 (!) 148/95  Pulse: (!) 111 (!) 106 89 95  Resp:  18  18  Temp:  98.2 F (36.8 C) 97.8 F (36.6 C) 98.2 F (36.8 C)  TempSrc:  Oral Oral Oral  SpO2: 94% 97% 100% 95%  Weight:  128 kg    Height:        General: Pt is alert,  awake, not in acute distress Cardiovascular: S1/S2 +, no rubs, no gallops Respiratory: severely diminished breath sounds b/l Abdominal: Soft, NT, obese, bowel sounds + Extremities:  no cyanosis    The results of significant diagnostics from  this hospitalization (including imaging, microbiology, ancillary and laboratory) are listed below for reference.     Microbiology: Recent Results (from the past 240 hour(s))  SARS Coronavirus 2 by RT PCR (hospital order, performed in [facility] hospital lab) Nasopharyngeal Nasopharyngeal Swab     Status: None   Collection Time: 07/22/20  6:22 AM   Specimen: Nasopharyngeal Swab  Result Value Ref Range Status   SARS Coronavirus 2 NEGATIVE NEGATIVE Final    Comment: (NOTE) SARS-CoV-2 target nucleic acids are NOT DETECTED.  The SARS-CoV-2 RNA is generally detectable in upper and lower respiratory specimens during the acute phase of infection. The lowest concentration of SARS-CoV-2 viral copies this assay can detect is 250 copies / mL. A negative result does not preclude SARS-CoV-2 infection and should not be used as the sole basis for treatment or other patient management decisions.  A negative result may occur with improper specimen collection / handling, submission of specimen other than nasopharyngeal swab, presence of viral mutation(s) within the areas targeted by this assay, and inadequate number of viral copies (<250 copies / mL). A negative result must be combined with clinical observations, patient history, and epidemiological information.  Fact Sheet for Patients:   BoilerBrush.com.cy  Fact Sheet for Healthcare Providers: https://pope.com/  This test is not yet approved or  cleared by the [place] FDA and has been authorized for detection and/or diagnosis of SARS-CoV-2 by FDA under an Emergency Use Authorization (EUA).  This EUA will remain in effect (meaning this test can be used) for the duration of the COVID-19 declaration under Section 564(b)(1) of the Act, 21 U.S.C. section 360bbb-3(b)(1), unless the authorization is terminated or revoked sooner.  Performed at [facility], [address]  [place], [place] [address]      Labs: BNP (last 3 results) Recent Labs    02/03/20 0913 02/25/20 0747  BNP 134.0* 44.0   Basic Metabolic Panel: Recent Labs  Lab 07/22/20 0553 07/23/20 0541 07/24/20 0813  NA 142 138 139  K 4.0 4.0 4.0  CL 108 104 105  CO2 25 24 23   GLUCOSE 123* 193* 187*  BUN 33* 27* 35*  CREATININE 1.55* 1.18* 1.19*  CALCIUM 8.7* 9.4 9.5   Liver Function Tests: Recent Labs  Lab 07/22/20 0553  AST 22  ALT 34  ALKPHOS 79  BILITOT 0.5  PROT 6.7  ALBUMIN 3.7   No results for input(s): LIPASE, AMYLASE in the last 168 hours. No results for input(s): AMMONIA in the last 168 hours. CBC: Recent Labs  Lab 07/22/20 0553 07/23/20 0541 07/24/20 0813  WBC 9.5 10.5 11.0*  HGB 14.7 13.9 15.2*  HCT 44.3 43.0 45.6  MCV 87.2 87.8 86.5  PLT 357 359 376   Cardiac Enzymes: No results for input(s): CKTOTAL, CKMB, CKMBINDEX, TROPONINI in the last 168 hours. BNP: Invalid input(s): POCBNP CBG: Recent Labs  Lab 07/23/20 0755 07/23/20 1116 07/23/20 1637 07/23/20 2155 07/24/20 0822  GLUCAP 206* 307* 202* 259* 212*   D-Dimer No results for input(s): DDIMER in the last 72 hours. Hgb A1c No results for input(s): HGBA1C in the last 72 hours. Lipid Profile No results for input(s): CHOL, HDL, LDLCALC, TRIG, CHOLHDL, LDLDIRECT in the last 72 hours. Thyroid function studies No results for input(s): TSH, T4TOTAL,  T3FREE, THYROIDAB in the last 72 hours.  Invalid input(s): FREET3 Anemia work up No results for input(s): VITAMINB12, FOLATE, FERRITIN, TIBC, IRON, RETICCTPCT in the last 72 hours. Urinalysis    Component Value Date/Time   COLORURINE YELLOW (A) 09/10/2019 0323   APPEARANCEUR HAZY (A) 09/10/2019 0323   APPEARANCEUR Hazy 01/03/2013 0528   LABSPEC 1.030 09/10/2019 0323   LABSPEC 1.023 01/03/2013 0528   PHURINE 7.0 09/10/2019 0323   GLUCOSEU NEGATIVE 09/10/2019 0323   GLUCOSEU >=500 01/03/2013 0528   HGBUR NEGATIVE 09/10/2019 0323   BILIRUBINUR  NEGATIVE 09/10/2019 0323   BILIRUBINUR Negative 01/03/2013 0528   KETONESUR NEGATIVE 09/10/2019 0323   PROTEINUR NEGATIVE 09/10/2019 0323   NITRITE NEGATIVE 09/10/2019 0323   LEUKOCYTESUR NEGATIVE 09/10/2019 0323   LEUKOCYTESUR Negative 01/03/2013 0528   Sepsis Labs Invalid input(s): PROCALCITONIN,  WBC,  LACTICIDVEN Microbiology Recent Results (from the past 240 hour(s))  SARS Coronavirus 2 by RT PCR (hospital order, performed in [facility] hospital lab) Nasopharyngeal Nasopharyngeal Swab     Status: None   Collection Time: 07/22/20  6:22 AM   Specimen: Nasopharyngeal Swab  Result Value Ref Range Status   SARS Coronavirus 2 NEGATIVE NEGATIVE Final    Comment: (NOTE) SARS-CoV-2 target nucleic acids are NOT DETECTED.  The SARS-CoV-2 RNA is generally detectable in upper and lower respiratory specimens during the acute phase of infection. The lowest concentration of SARS-CoV-2 viral copies this assay can detect is 250 copies / mL. A negative result does not preclude SARS-CoV-2 infection and should not be used as the sole basis for treatment or other patient management decisions.  A negative result may occur with improper specimen collection / handling, submission of specimen other than nasopharyngeal swab, presence of viral mutation(s) within the areas targeted by this assay, and inadequate number of viral copies (<250 copies / mL). A negative result must be combined with clinical observations, patient history, and epidemiological information.  Fact Sheet for Patients:   BoilerBrush.com.cy  Fact Sheet for Healthcare Providers: https://pope.com/  This test is not yet approved or  cleared by the [place] FDA and has been authorized for detection and/or diagnosis of SARS-CoV-2 by FDA under an Emergency Use Authorization (EUA).  This EUA will remain in effect (meaning this test can be used) for the duration of the COVID-19  declaration under Section 564(b)(1) of the Act, 21 U.S.C. section 360bbb-3(b)(1), unless the authorization is terminated or revoked sooner.  Performed at [facility], [address] [place], [place] [address]      Time coordinating discharge: Over 30 minutes  SIGNED:   Charise Killian, MD  [facility] 07/24/2020, 12:36 PM Pager   If 7PM-7AM, please contact night-coverage [email or web address]

## 2020-07-24 NOTE — Plan of Care (Signed)
  Problem: Education: Goal: Knowledge of General Education information will improve Description: Including pain rating scale, medication(s)/side effects and non-pharmacologic comfort measures Outcome: Progressing   Problem: Education: Goal: Knowledge of disease or condition will improve Outcome: Progressing Goal: Knowledge of the prescribed therapeutic regimen will improve Outcome: Progressing   Problem: Respiratory: Goal: Ability to maintain a clear airway will improve Outcome: Progressing Goal: Levels of oxygenation will improve Outcome: Progressing

## 2020-07-24 NOTE — Care Management Important Message (Signed)
Important Message  Patient Details  Name: MELANEE CORDIAL MRN: [id number] Date of Birth: [birth year]   Medicare Important Message Given:  Other (see comment)  Patient left AMA.  Unable to deliver concurrent Medicare IM.  Received initial on 07/22/2020 at 3:27pm.   Johnell Comings 07/24/2020, 11:09 AM

## 2020-08-04 DIAGNOSIS — E119 Type 2 diabetes mellitus without complications: Secondary | ICD-10-CM | POA: Diagnosis not present

## 2020-08-10 DIAGNOSIS — J449 Chronic obstructive pulmonary disease, unspecified: Secondary | ICD-10-CM | POA: Diagnosis not present

## 2020-08-13 DIAGNOSIS — J449 Chronic obstructive pulmonary disease, unspecified: Secondary | ICD-10-CM | POA: Diagnosis not present

## 2020-08-16 DIAGNOSIS — J9621 Acute and chronic respiratory failure with hypoxia: Secondary | ICD-10-CM | POA: Diagnosis not present

## 2020-08-16 DIAGNOSIS — J9622 Acute and chronic respiratory failure with hypercapnia: Secondary | ICD-10-CM | POA: Diagnosis not present

## 2020-08-16 DIAGNOSIS — J449 Chronic obstructive pulmonary disease, unspecified: Secondary | ICD-10-CM | POA: Diagnosis not present

## 2020-08-17 DIAGNOSIS — J9621 Acute and chronic respiratory failure with hypoxia: Secondary | ICD-10-CM | POA: Diagnosis not present

## 2020-08-17 DIAGNOSIS — G4733 Obstructive sleep apnea (adult) (pediatric): Secondary | ICD-10-CM | POA: Diagnosis not present

## 2020-08-17 DIAGNOSIS — J449 Chronic obstructive pulmonary disease, unspecified: Secondary | ICD-10-CM | POA: Diagnosis not present

## 2020-08-17 DIAGNOSIS — J45998 Other asthma: Secondary | ICD-10-CM | POA: Diagnosis not present

## 2020-09-10 DIAGNOSIS — J449 Chronic obstructive pulmonary disease, unspecified: Secondary | ICD-10-CM | POA: Diagnosis not present

## 2020-09-11 IMAGING — DX PORTABLE CHEST - 1 VIEW
1 series · 1 of 1 positions shown · non-contrast
Comparison: 12/01/2018 and earlier.

CLINICAL DATA: 53-year-old female with shortness of breath for 2
days.

EXAM:
PORTABLE CHEST 1 VIEW

[chest ap]
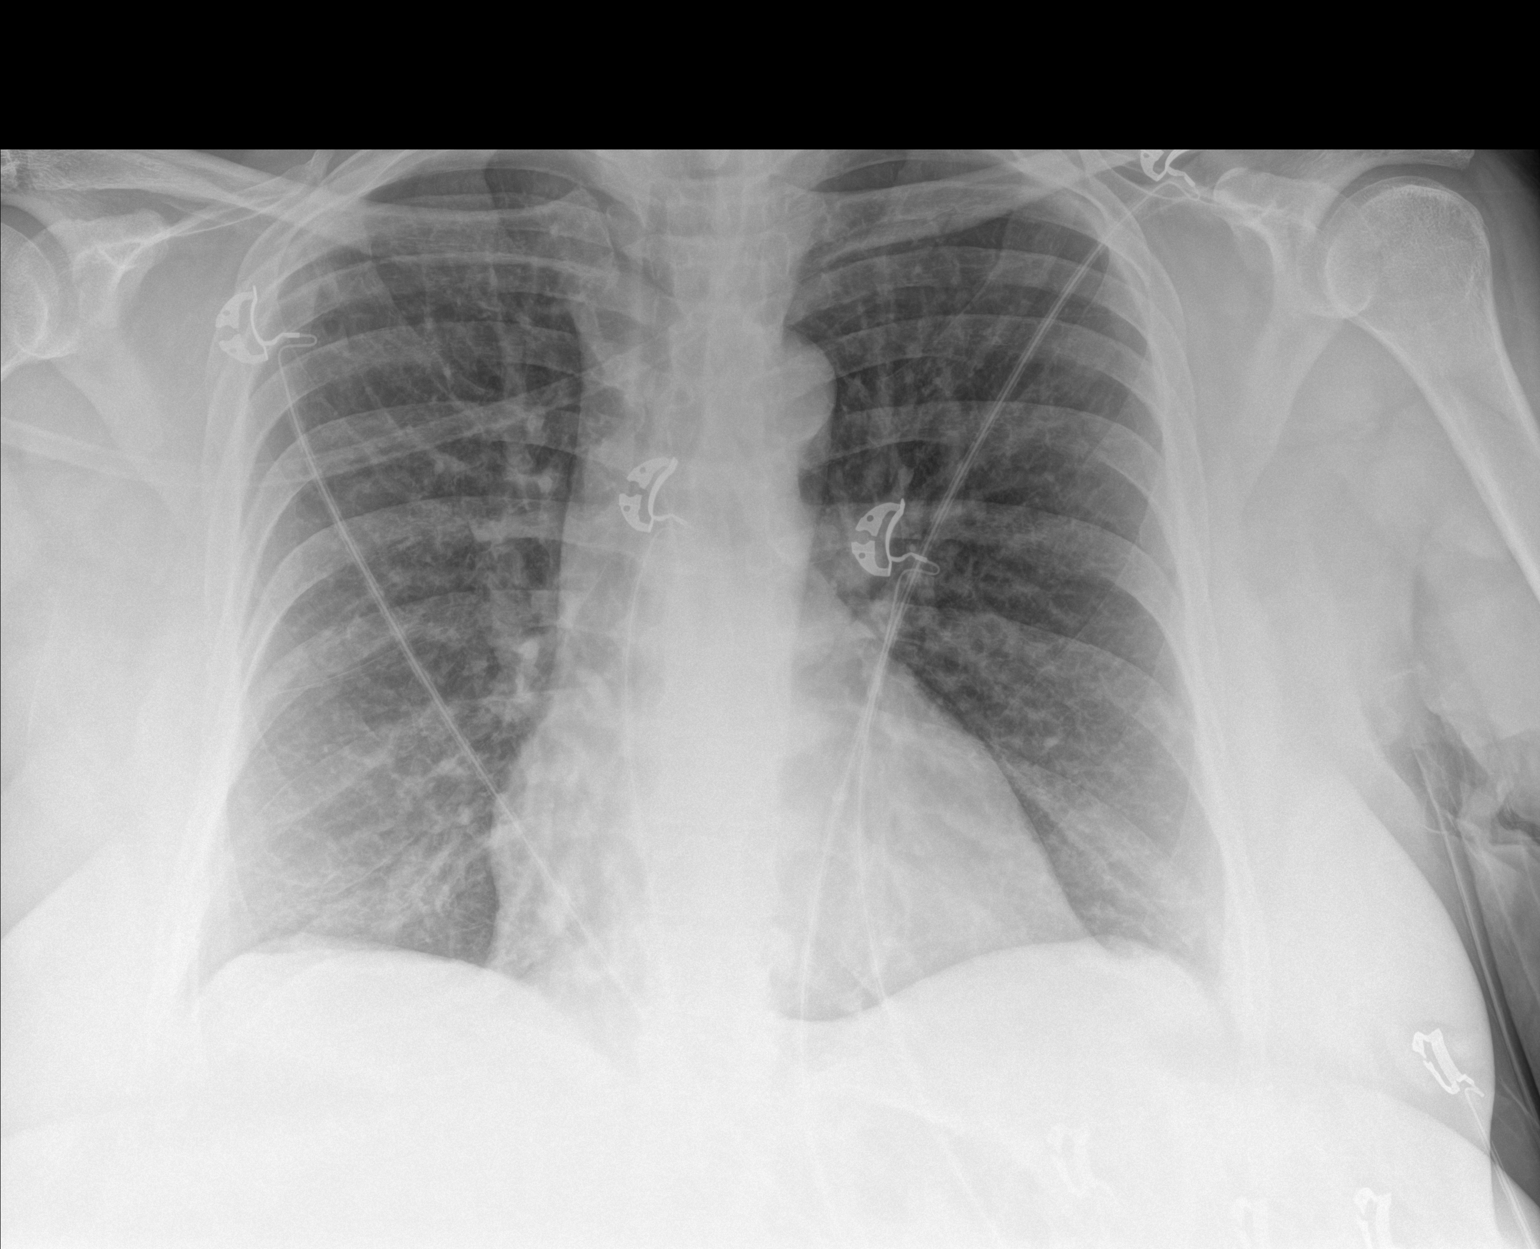

[1 of 1 positions shown; findings below may reference images not displayed]

FINDINGS: Portable AP upright view at 7250 hours. Mediastinal contours remain
normal. Visualized tracheal air column is within normal limits.
Stable lung volumes. Mild basilar predominant increased interstitial
markings are chronic. No pneumothorax, pleural effusion or acute
opacity. Paucity of bowel gas in the upper abdomen. No acute osseous
abnormality identified.
IMPRESSION: No acute cardiopulmonary abnormality.

## 2020-09-12 DIAGNOSIS — J449 Chronic obstructive pulmonary disease, unspecified: Secondary | ICD-10-CM | POA: Diagnosis not present

## 2020-09-15 DIAGNOSIS — J9621 Acute and chronic respiratory failure with hypoxia: Secondary | ICD-10-CM | POA: Diagnosis not present

## 2020-09-15 DIAGNOSIS — J449 Chronic obstructive pulmonary disease, unspecified: Secondary | ICD-10-CM | POA: Diagnosis not present

## 2020-09-15 DIAGNOSIS — J9622 Acute and chronic respiratory failure with hypercapnia: Secondary | ICD-10-CM | POA: Diagnosis not present

## 2020-09-16 DIAGNOSIS — G4733 Obstructive sleep apnea (adult) (pediatric): Secondary | ICD-10-CM | POA: Diagnosis not present

## 2020-09-16 DIAGNOSIS — J45998 Other asthma: Secondary | ICD-10-CM | POA: Diagnosis not present

## 2020-09-16 DIAGNOSIS — J449 Chronic obstructive pulmonary disease, unspecified: Secondary | ICD-10-CM | POA: Diagnosis not present

## 2020-09-16 DIAGNOSIS — J9621 Acute and chronic respiratory failure with hypoxia: Secondary | ICD-10-CM | POA: Diagnosis not present

## 2020-10-10 DIAGNOSIS — J449 Chronic obstructive pulmonary disease, unspecified: Secondary | ICD-10-CM | POA: Diagnosis not present

## 2020-10-13 DIAGNOSIS — J449 Chronic obstructive pulmonary disease, unspecified: Secondary | ICD-10-CM | POA: Diagnosis not present

## 2020-10-16 DIAGNOSIS — J9621 Acute and chronic respiratory failure with hypoxia: Secondary | ICD-10-CM | POA: Diagnosis not present

## 2020-10-16 DIAGNOSIS — J449 Chronic obstructive pulmonary disease, unspecified: Secondary | ICD-10-CM | POA: Diagnosis not present

## 2020-10-16 DIAGNOSIS — J9622 Acute and chronic respiratory failure with hypercapnia: Secondary | ICD-10-CM | POA: Diagnosis not present

## 2020-10-17 DIAGNOSIS — J9621 Acute and chronic respiratory failure with hypoxia: Secondary | ICD-10-CM | POA: Diagnosis not present

## 2020-10-17 DIAGNOSIS — J449 Chronic obstructive pulmonary disease, unspecified: Secondary | ICD-10-CM | POA: Diagnosis not present

## 2020-10-17 DIAGNOSIS — J45998 Other asthma: Secondary | ICD-10-CM | POA: Diagnosis not present

## 2020-10-17 DIAGNOSIS — G4733 Obstructive sleep apnea (adult) (pediatric): Secondary | ICD-10-CM | POA: Diagnosis not present

## 2020-11-10 DIAGNOSIS — J449 Chronic obstructive pulmonary disease, unspecified: Secondary | ICD-10-CM | POA: Diagnosis not present

## 2020-11-12 DIAGNOSIS — J449 Chronic obstructive pulmonary disease, unspecified: Secondary | ICD-10-CM | POA: Diagnosis not present

## 2020-11-15 DIAGNOSIS — J9621 Acute and chronic respiratory failure with hypoxia: Secondary | ICD-10-CM | POA: Diagnosis not present

## 2020-11-15 DIAGNOSIS — J9622 Acute and chronic respiratory failure with hypercapnia: Secondary | ICD-10-CM | POA: Diagnosis not present

## 2020-11-15 DIAGNOSIS — J449 Chronic obstructive pulmonary disease, unspecified: Secondary | ICD-10-CM | POA: Diagnosis not present

## 2020-11-16 DIAGNOSIS — J449 Chronic obstructive pulmonary disease, unspecified: Secondary | ICD-10-CM | POA: Diagnosis not present

## 2020-11-16 DIAGNOSIS — G4733 Obstructive sleep apnea (adult) (pediatric): Secondary | ICD-10-CM | POA: Diagnosis not present

## 2020-11-16 DIAGNOSIS — J45998 Other asthma: Secondary | ICD-10-CM | POA: Diagnosis not present

## 2020-11-16 DIAGNOSIS — J9621 Acute and chronic respiratory failure with hypoxia: Secondary | ICD-10-CM | POA: Diagnosis not present

## 2020-11-24 DIAGNOSIS — R059 Cough, unspecified: Secondary | ICD-10-CM | POA: Diagnosis not present

## 2020-11-24 DIAGNOSIS — J449 Chronic obstructive pulmonary disease, unspecified: Secondary | ICD-10-CM | POA: Diagnosis not present

## 2020-11-24 DIAGNOSIS — G4733 Obstructive sleep apnea (adult) (pediatric): Secondary | ICD-10-CM | POA: Diagnosis not present

## 2020-11-24 DIAGNOSIS — J9621 Acute and chronic respiratory failure with hypoxia: Secondary | ICD-10-CM | POA: Diagnosis not present

## 2020-11-24 DIAGNOSIS — R0600 Dyspnea, unspecified: Secondary | ICD-10-CM | POA: Diagnosis not present

## 2020-12-10 DIAGNOSIS — J449 Chronic obstructive pulmonary disease, unspecified: Secondary | ICD-10-CM | POA: Diagnosis not present

## 2020-12-13 DIAGNOSIS — J449 Chronic obstructive pulmonary disease, unspecified: Secondary | ICD-10-CM | POA: Diagnosis not present

## 2020-12-16 DIAGNOSIS — J9622 Acute and chronic respiratory failure with hypercapnia: Secondary | ICD-10-CM | POA: Diagnosis not present

## 2020-12-16 DIAGNOSIS — J9621 Acute and chronic respiratory failure with hypoxia: Secondary | ICD-10-CM | POA: Diagnosis not present

## 2020-12-16 DIAGNOSIS — J449 Chronic obstructive pulmonary disease, unspecified: Secondary | ICD-10-CM | POA: Diagnosis not present

## 2020-12-17 DIAGNOSIS — J9621 Acute and chronic respiratory failure with hypoxia: Secondary | ICD-10-CM | POA: Diagnosis not present

## 2020-12-17 DIAGNOSIS — G4733 Obstructive sleep apnea (adult) (pediatric): Secondary | ICD-10-CM | POA: Diagnosis not present

## 2020-12-17 DIAGNOSIS — J45998 Other asthma: Secondary | ICD-10-CM | POA: Diagnosis not present

## 2020-12-17 DIAGNOSIS — J449 Chronic obstructive pulmonary disease, unspecified: Secondary | ICD-10-CM | POA: Diagnosis not present

## 2021-01-09 IMAGING — CT CT ABD-PELV W/ CM
2 of 5 series · 15 of 46 positions shown, 17 images · IV contrast (omnipaque)
Comparison: None.

CLINICAL DATA: Incarcerated hernia

EXAM:
CT ABDOMEN AND PELVIS WITH CONTRAST
TECHNIQUE: Multidetector CT imaging of the abdomen and pelvis was performed
using the standard protocol following bolus administration of
intravenous contrast.
CONTRAST:  100mL OMNIPAQUE IOHEXOL 300 MG/ML  SOLN

[Series 2: routine abd/pel with · axial · 0.85mm/px · z∈[-1122,-697]mm · 12 of 97 slices shown, 14 images]
[im 6/97  soft-tissue]
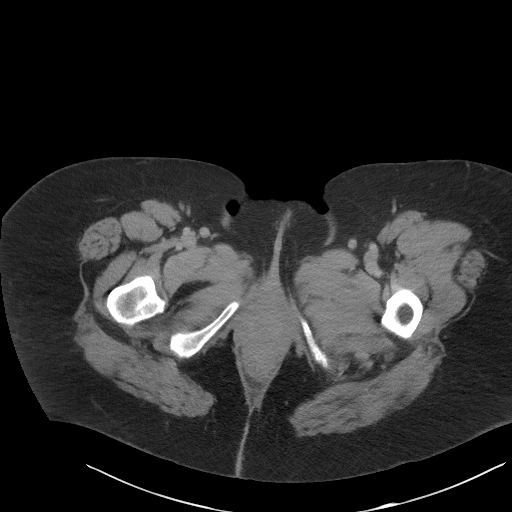
[im 6/97  bone]
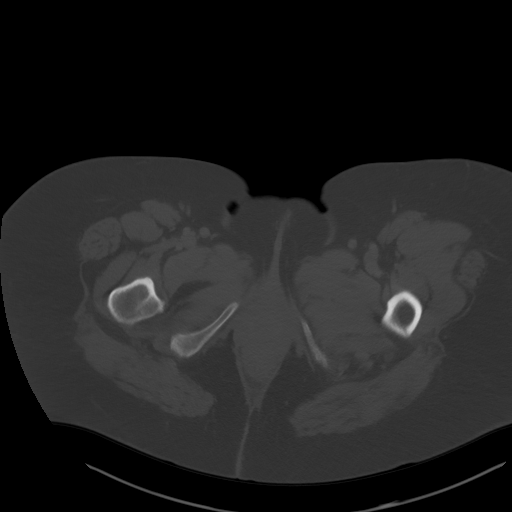
[im 16/97  soft-tissue]
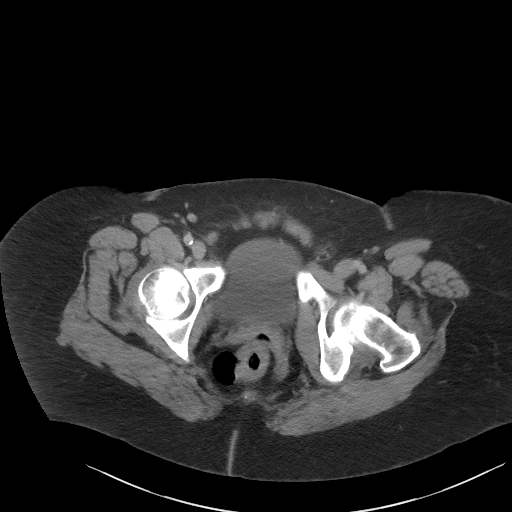
[im 21/97  soft-tissue]
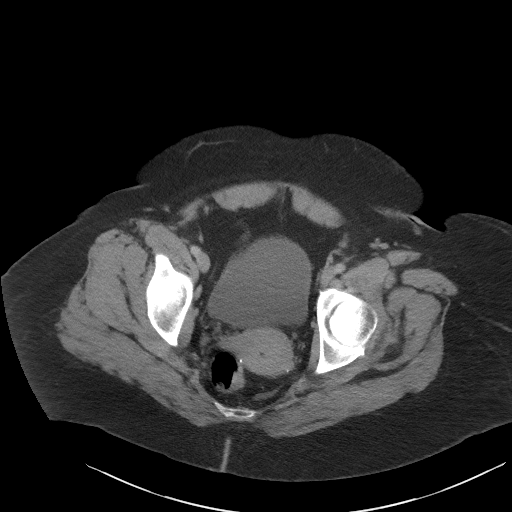
[im 31/97  soft-tissue]
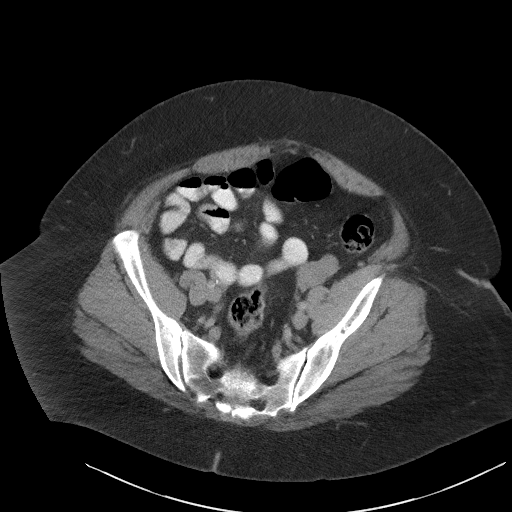
[im 36/97  soft-tissue]
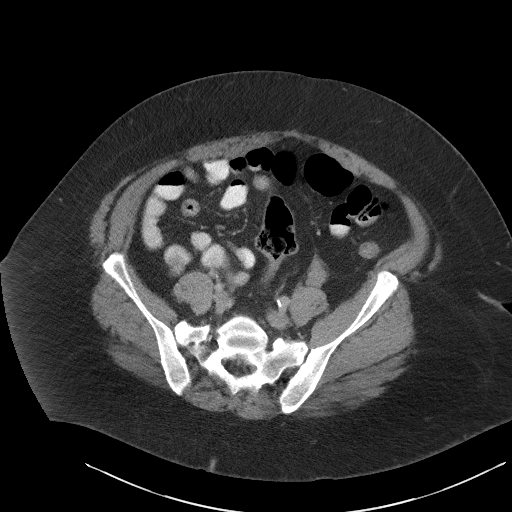
[im 46/97  soft-tissue]
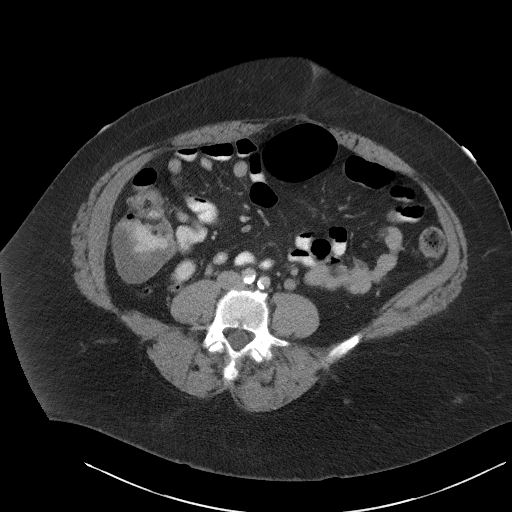
[im 51/97  soft-tissue]
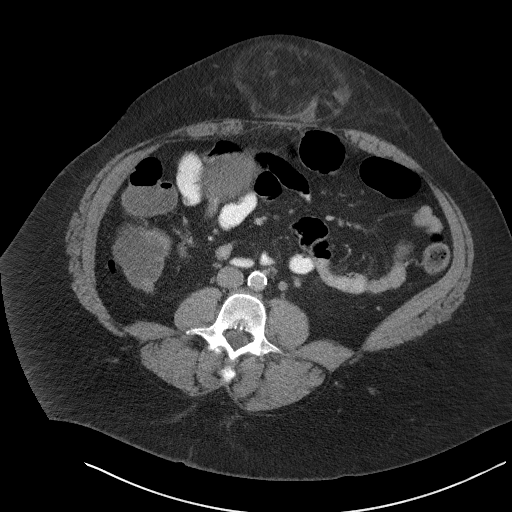
[im 61/97  soft-tissue]
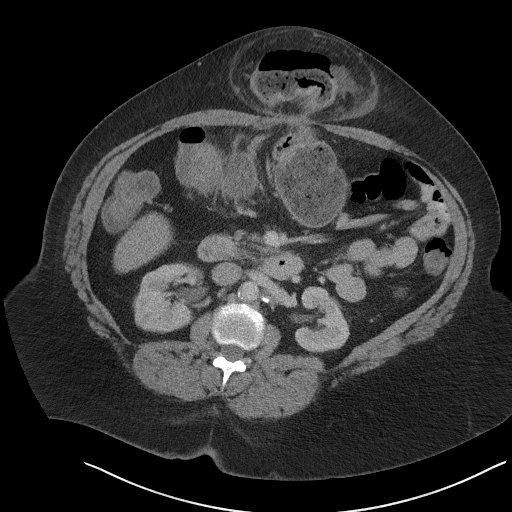
[im 66/97  soft-tissue]
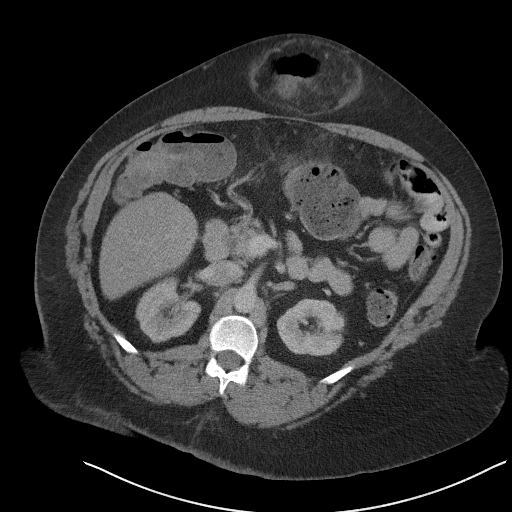
[im 66/97  bone]
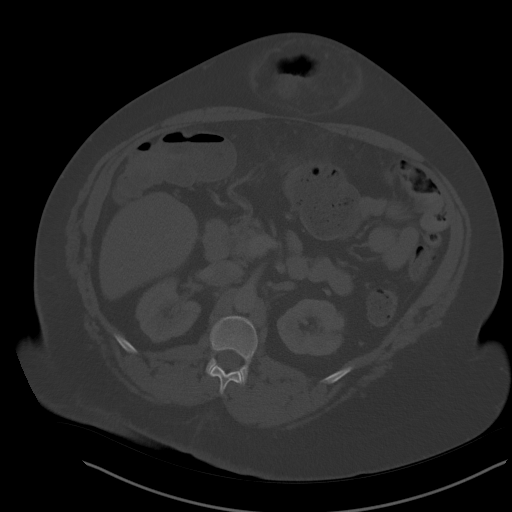
[im 76/97  soft-tissue]
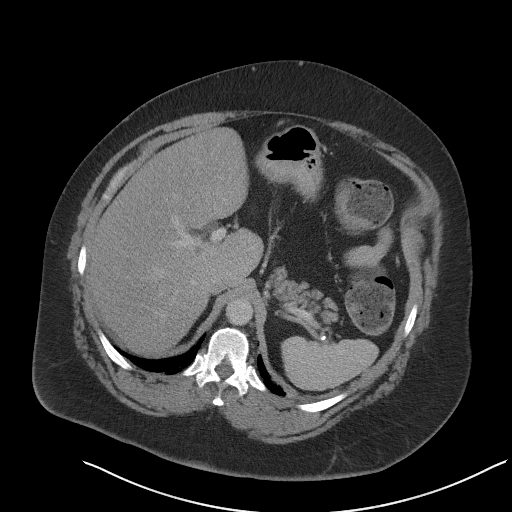
[im 81/97  soft-tissue]
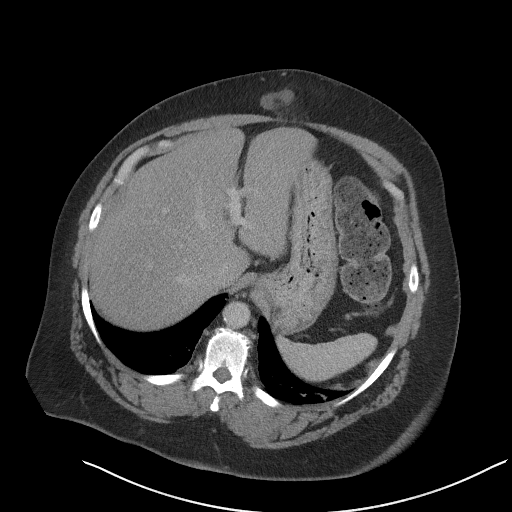
[im 91/97  soft-tissue]
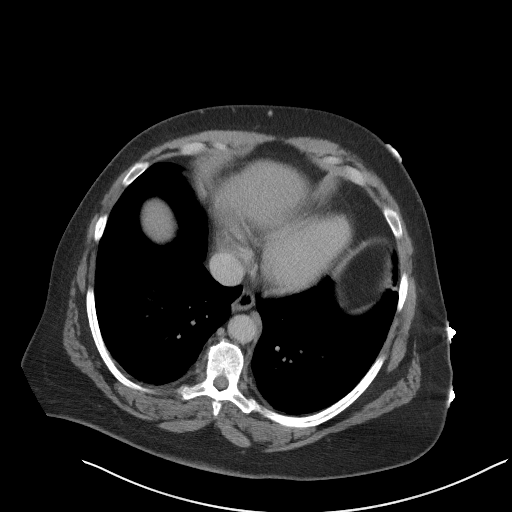

[Series 5: coronal st · coronal · 0.82mm/px · 3 of 128 slices shown]
[im 43/128  soft-tissue]
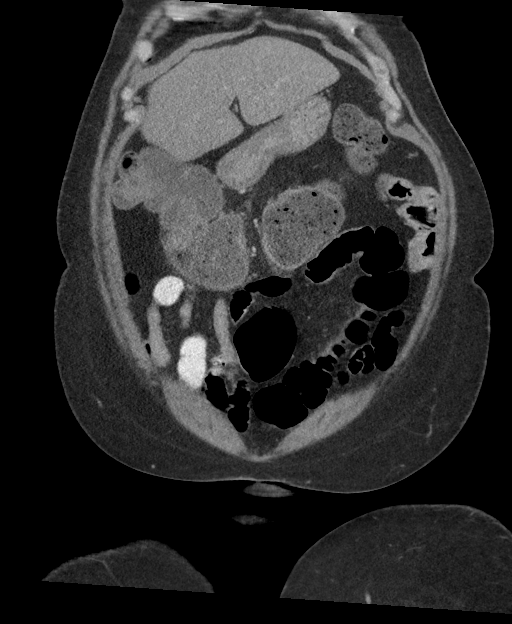
[im 57/128  soft-tissue]
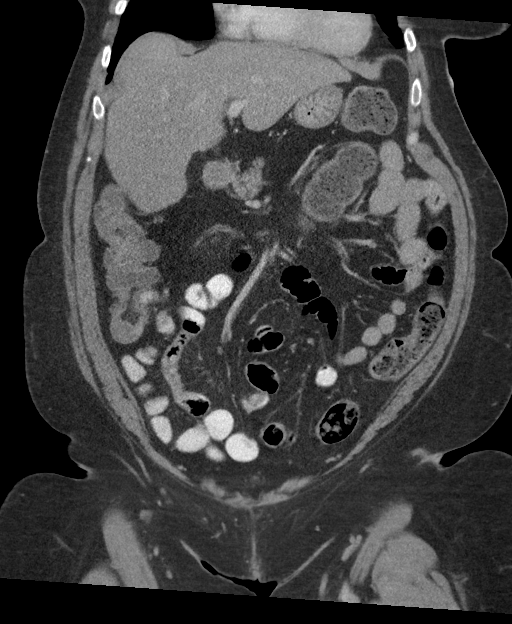
[im 71/128  soft-tissue]
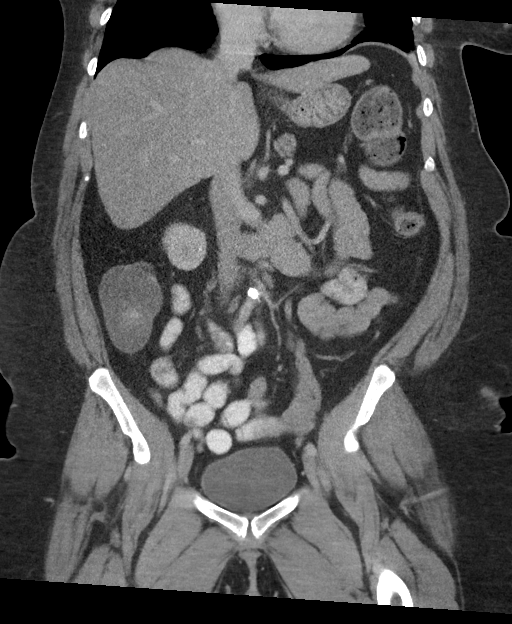

[15 of 46 positions shown; findings below may reference images not displayed]

FINDINGS: Lower chest: Linear atelectasis in both lower lobes, left greater
than right. No pleural fluid.

Hepatobiliary: Mild hepatic steatosis without focal lesion. Clips in
the gallbladder fossa postcholecystectomy. No biliary dilatation.

Pancreas: No ductal dilatation or inflammation.

Spleen: Normal in size without focal abnormality.

Adrenals/Urinary Tract: Nonspecific 10 mm left adrenal nodule.
Normal right adrenal gland. Mild left renal scarring. No
hydronephrosis or perinephric edema. Small cyst in the upper right
kidney. Symmetric excretion on delayed phase imaging. Urinary
bladder is unremarkable.

Stomach/Bowel: Supraumbilical ventral abdominal wall hernia contains
short segment of transverse colon. There is adjacent free fluid and
stranding within the hernia sac, with wall thickening of the colonic
segments within the hernia neck. The hernia neck measures 4.2 x
cm in transverse by AP dimension. Moderate volume of stool
throughout the colon both proximal and distal to the umbilical
hernia. No small bowel dilatation, inflammation, or obstruction.
Stomach is nondistended. Normal appendix.

Vascular/Lymphatic: Moderate aorto bi-iliac atherosclerosis. No
aneurysm. No mesenteric or portal venous gas. No enlarged lymph
nodes in the abdomen or pelvis.

Reproductive: Uterus and bilateral adnexa are unremarkable.

Other: Supraumbilical ventral abdominal wall hernia containing short
segment of transverse colon as described above. There is also a
small fat containing umbilical hernia. And upper abdominal ventral
abdominal wall hernia contains minimal free fluid without
inflammatory [REDACTED] change. There is mesenteric edema at the
supraumbilical hernia. No ascites in the abdomen or pelvis. No free
air or perforation.

Musculoskeletal: Avascular necrosis of both femoral heads.
Transitional lumbosacral anatomy. There are no acute or suspicious
osseous abnormalities.
IMPRESSION: 1. Supraumbilical ventral abdominal wall hernia contains transverse
colon with associated inflammatory change suspicious for
incarceration. There is inflammatory changes in free fluid in the
hernia sac as well as mesenteric edema. No perforation or bowel
obstruction.
2. Small additional fat containing umbilical and upper abdominal
wall hernias.
3. Mild hepatic steatosis.
4. Avascular necrosis of both femoral heads.
5. Nonspecific 10 mm left adrenal nodule, likely an adenoma but no
prior exams to available for comparison.

Aortic Atherosclerosis ([id number]).

## 2021-01-10 DIAGNOSIS — J449 Chronic obstructive pulmonary disease, unspecified: Secondary | ICD-10-CM | POA: Diagnosis not present

## 2021-01-13 DIAGNOSIS — J449 Chronic obstructive pulmonary disease, unspecified: Secondary | ICD-10-CM | POA: Diagnosis not present

## 2021-01-16 DIAGNOSIS — J9622 Acute and chronic respiratory failure with hypercapnia: Secondary | ICD-10-CM | POA: Diagnosis not present

## 2021-01-16 DIAGNOSIS — J9621 Acute and chronic respiratory failure with hypoxia: Secondary | ICD-10-CM | POA: Diagnosis not present

## 2021-01-16 DIAGNOSIS — J449 Chronic obstructive pulmonary disease, unspecified: Secondary | ICD-10-CM | POA: Diagnosis not present

## 2021-01-17 DIAGNOSIS — J9621 Acute and chronic respiratory failure with hypoxia: Secondary | ICD-10-CM | POA: Diagnosis not present

## 2021-01-17 DIAGNOSIS — J449 Chronic obstructive pulmonary disease, unspecified: Secondary | ICD-10-CM | POA: Diagnosis not present

## 2021-01-17 DIAGNOSIS — J45998 Other asthma: Secondary | ICD-10-CM | POA: Diagnosis not present

## 2021-01-17 DIAGNOSIS — G4733 Obstructive sleep apnea (adult) (pediatric): Secondary | ICD-10-CM | POA: Diagnosis not present

## 2021-02-08 DIAGNOSIS — J449 Chronic obstructive pulmonary disease, unspecified: Secondary | ICD-10-CM | POA: Diagnosis not present

## 2021-02-10 DIAGNOSIS — J449 Chronic obstructive pulmonary disease, unspecified: Secondary | ICD-10-CM | POA: Diagnosis not present

## 2021-02-13 DIAGNOSIS — J9622 Acute and chronic respiratory failure with hypercapnia: Secondary | ICD-10-CM | POA: Diagnosis not present

## 2021-02-13 DIAGNOSIS — J449 Chronic obstructive pulmonary disease, unspecified: Secondary | ICD-10-CM | POA: Diagnosis not present

## 2021-02-13 DIAGNOSIS — J9621 Acute and chronic respiratory failure with hypoxia: Secondary | ICD-10-CM | POA: Diagnosis not present

## 2021-02-23 DIAGNOSIS — E663 Overweight: Secondary | ICD-10-CM | POA: Diagnosis not present

## 2021-02-23 DIAGNOSIS — J310 Chronic rhinitis: Secondary | ICD-10-CM | POA: Diagnosis not present

## 2021-02-23 DIAGNOSIS — R0600 Dyspnea, unspecified: Secondary | ICD-10-CM | POA: Diagnosis not present

## 2021-02-23 DIAGNOSIS — G4733 Obstructive sleep apnea (adult) (pediatric): Secondary | ICD-10-CM | POA: Diagnosis not present

## 2021-02-23 DIAGNOSIS — J441 Chronic obstructive pulmonary disease with (acute) exacerbation: Secondary | ICD-10-CM | POA: Diagnosis not present

## 2021-03-08 DIAGNOSIS — I10 Essential (primary) hypertension: Secondary | ICD-10-CM | POA: Diagnosis not present

## 2021-03-08 DIAGNOSIS — R7309 Other abnormal glucose: Secondary | ICD-10-CM | POA: Diagnosis not present

## 2021-03-08 DIAGNOSIS — J449 Chronic obstructive pulmonary disease, unspecified: Secondary | ICD-10-CM | POA: Diagnosis not present

## 2021-03-08 DIAGNOSIS — E119 Type 2 diabetes mellitus without complications: Secondary | ICD-10-CM | POA: Diagnosis not present

## 2021-03-10 DIAGNOSIS — J449 Chronic obstructive pulmonary disease, unspecified: Secondary | ICD-10-CM | POA: Diagnosis not present

## 2021-03-13 DIAGNOSIS — J449 Chronic obstructive pulmonary disease, unspecified: Secondary | ICD-10-CM | POA: Diagnosis not present

## 2021-03-16 DIAGNOSIS — J9621 Acute and chronic respiratory failure with hypoxia: Secondary | ICD-10-CM | POA: Diagnosis not present

## 2021-03-16 DIAGNOSIS — J449 Chronic obstructive pulmonary disease, unspecified: Secondary | ICD-10-CM | POA: Diagnosis not present

## 2021-03-16 DIAGNOSIS — J9622 Acute and chronic respiratory failure with hypercapnia: Secondary | ICD-10-CM | POA: Diagnosis not present

## 2021-03-17 IMAGING — DX DG CHEST 1V PORT
1 series · 1 of 1 positions shown · non-contrast
Comparison: 12/01/2018

CLINICAL DATA: Dyspnea.  Respiratory distress.

EXAM:
PORTABLE CHEST 1 VIEW

[chest ap]
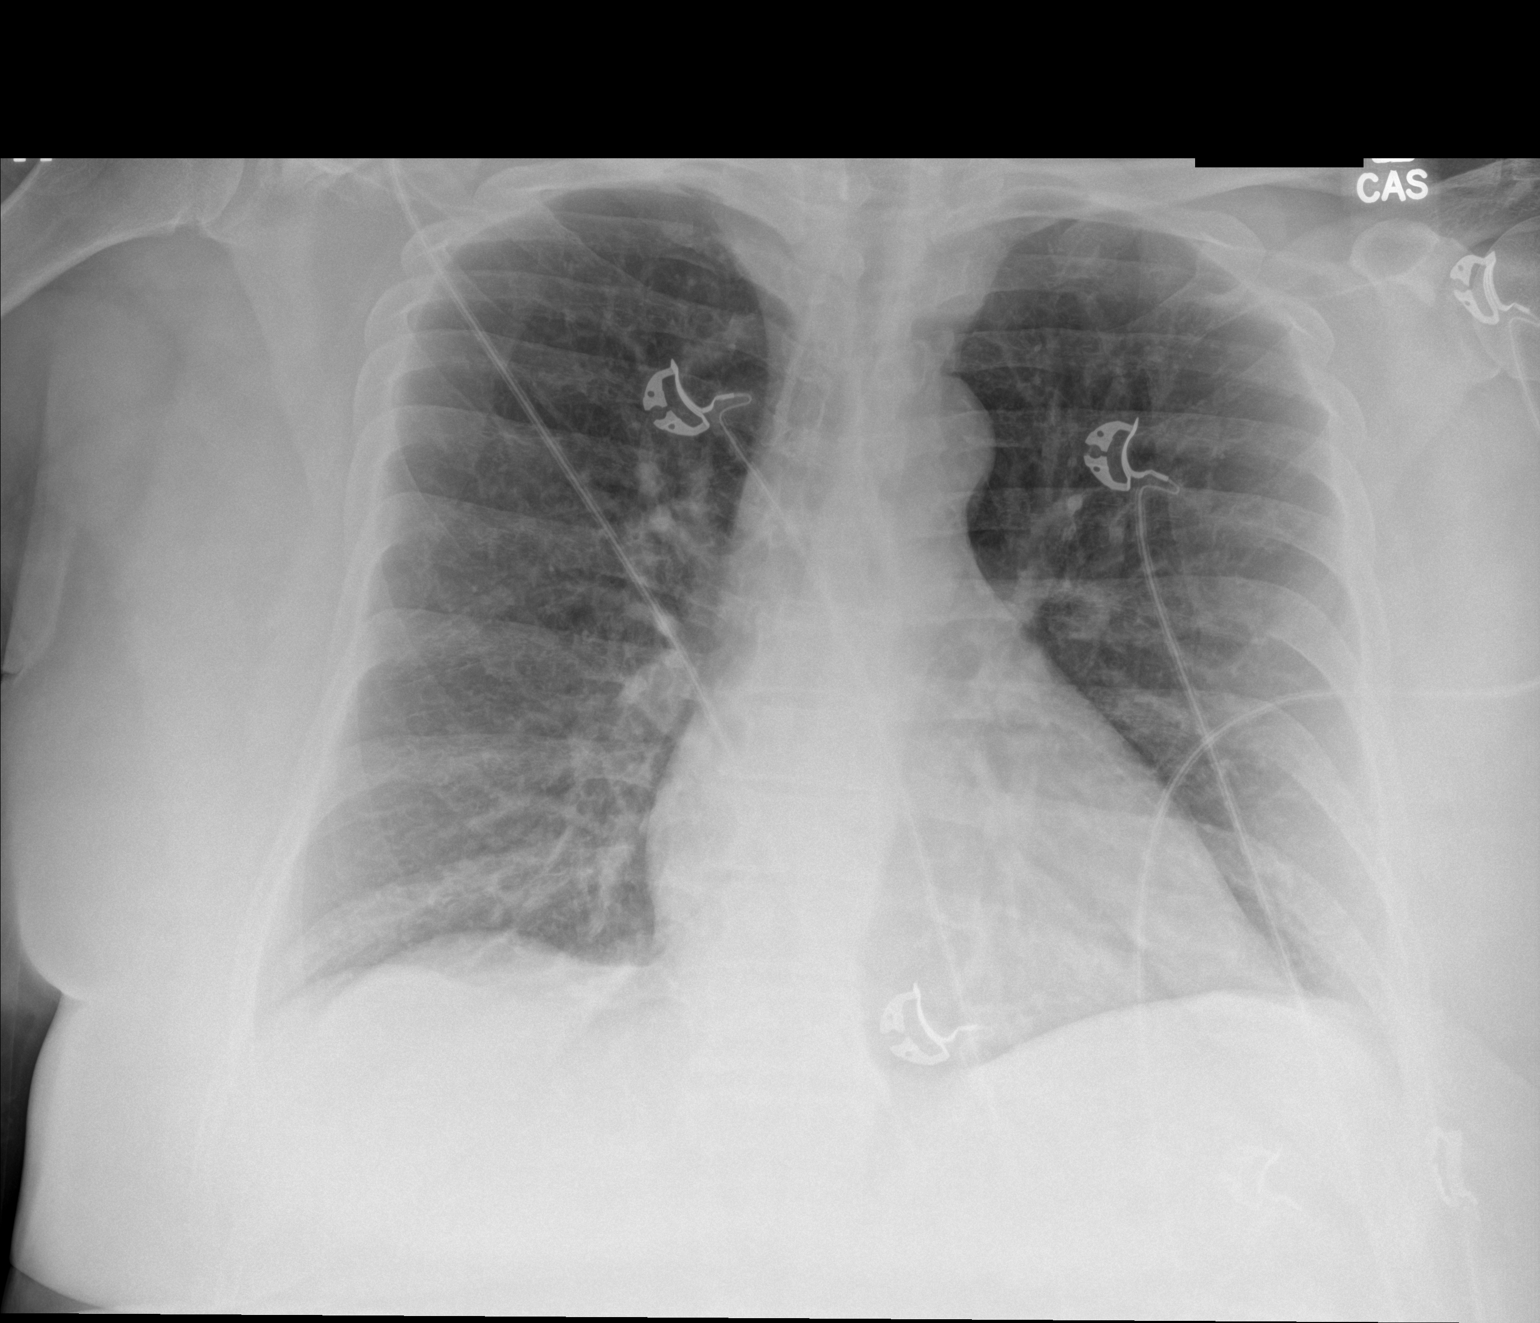

[1 of 1 positions shown; findings below may reference images not displayed]

FINDINGS: The heart size and mediastinal contours are within normal limits.
Both lungs are clear. The visualized skeletal structures are
unremarkable.
IMPRESSION: No active disease.

## 2021-04-10 DIAGNOSIS — J449 Chronic obstructive pulmonary disease, unspecified: Secondary | ICD-10-CM | POA: Diagnosis not present

## 2021-04-12 DIAGNOSIS — J449 Chronic obstructive pulmonary disease, unspecified: Secondary | ICD-10-CM | POA: Diagnosis not present

## 2021-04-15 DIAGNOSIS — J9621 Acute and chronic respiratory failure with hypoxia: Secondary | ICD-10-CM | POA: Diagnosis not present

## 2021-04-15 DIAGNOSIS — J9622 Acute and chronic respiratory failure with hypercapnia: Secondary | ICD-10-CM | POA: Diagnosis not present

## 2021-04-15 DIAGNOSIS — J449 Chronic obstructive pulmonary disease, unspecified: Secondary | ICD-10-CM | POA: Diagnosis not present

## 2021-04-30 IMAGING — CR DG CHEST 2V
1 series · 2 of 2 positions shown · non-contrast
Comparison: 11/16/2019

CLINICAL DATA: Shortness of breath, history of possible recent
COVID exposure

EXAM:
CHEST - 2 VIEW

[Series 1: w chest pa · 0.14mm/px · 2 of 2 slices shown]
[im 1/2]
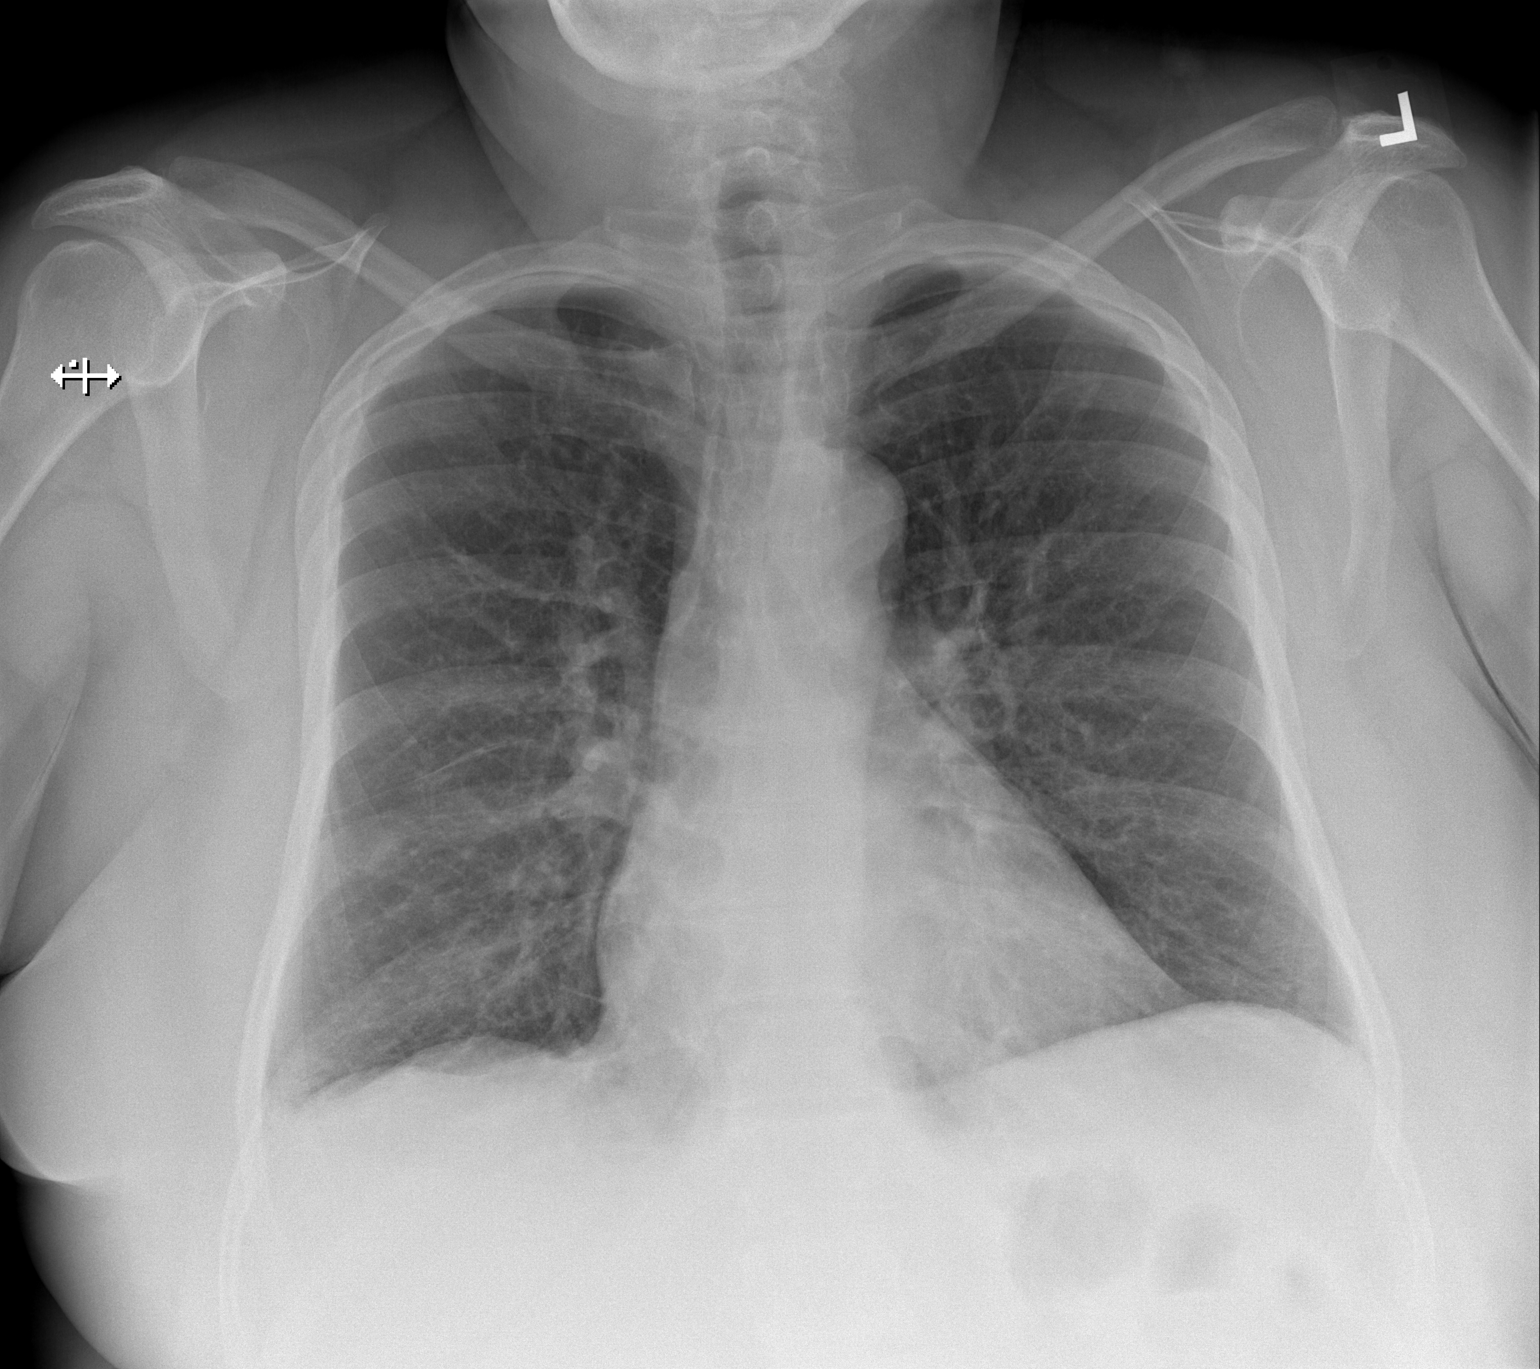
[im 2/2]
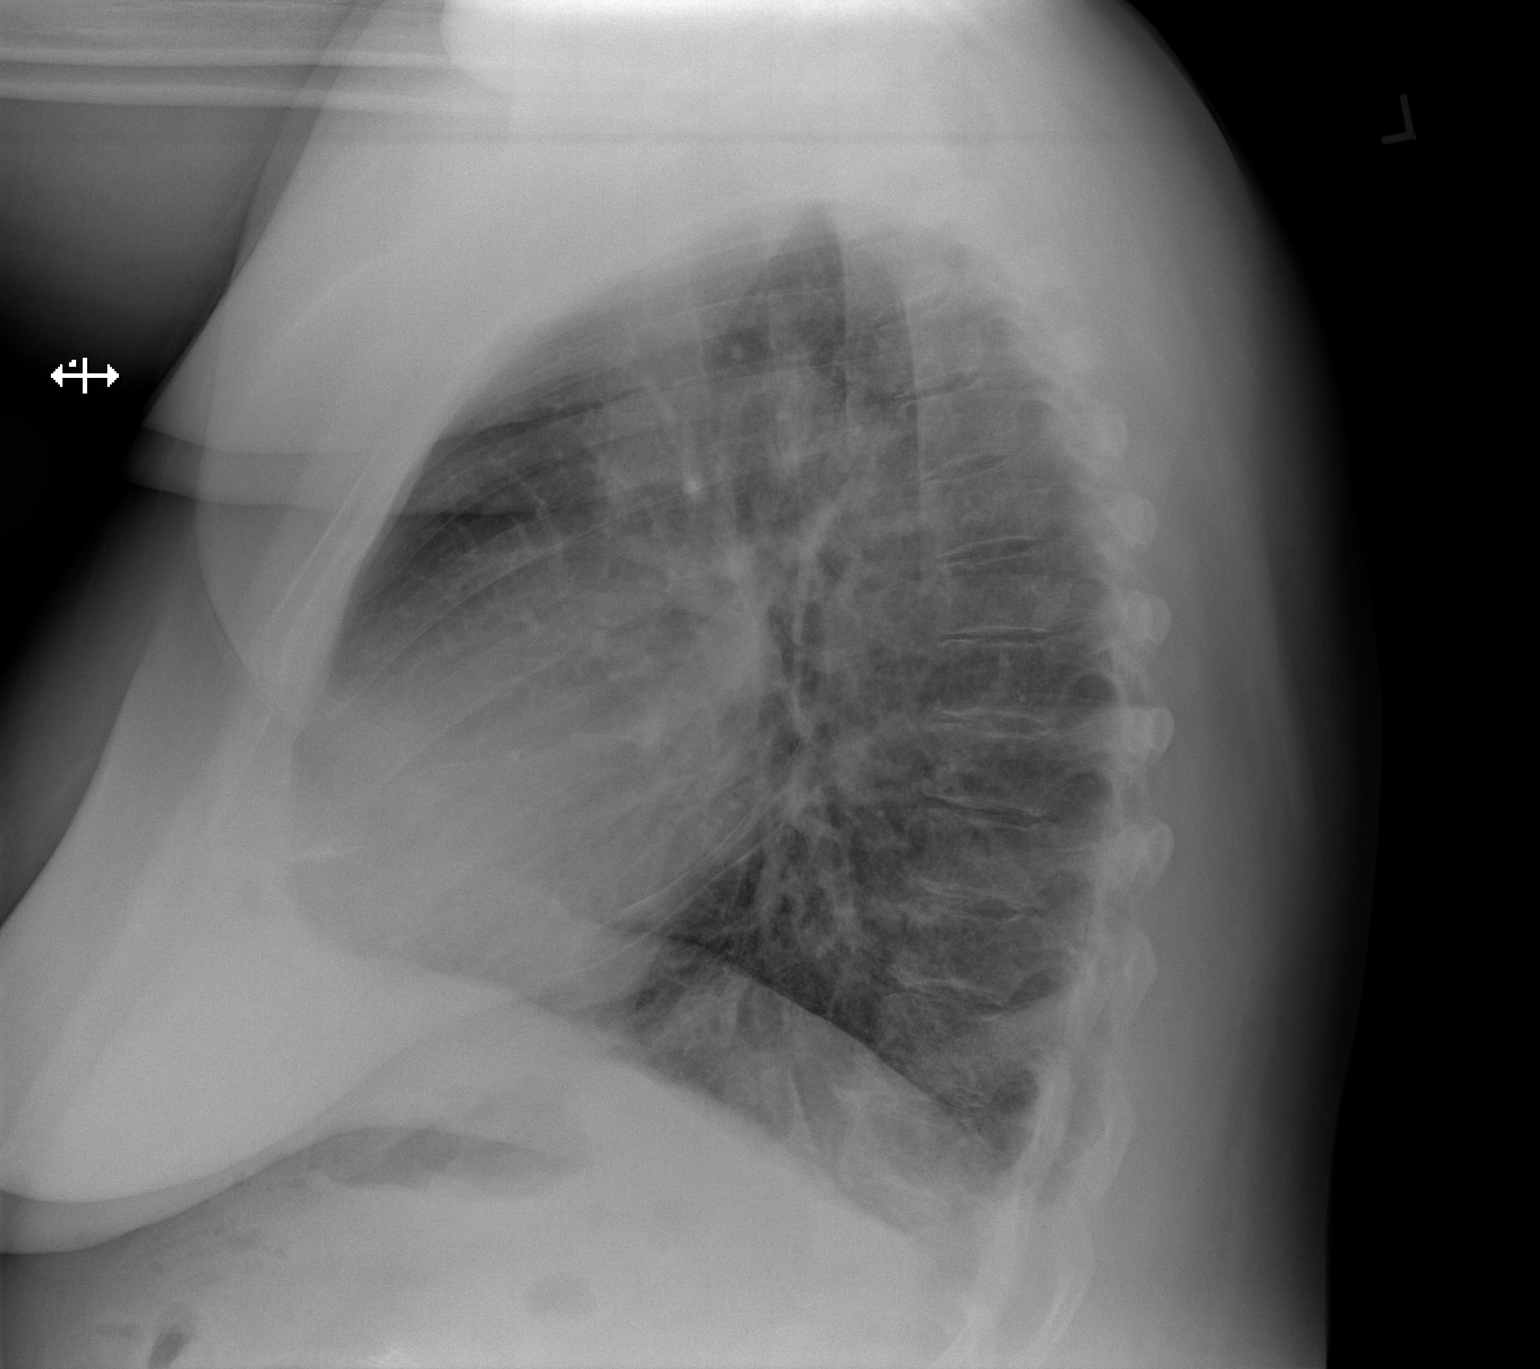

[2 of 2 positions shown; findings below may reference images not displayed]

FINDINGS: Cardiac shadow is stable. The lungs are well aerated bilaterally. No
focal infiltrate or sizable effusion is seen. No bony abnormality is
noted.
IMPRESSION: No acute abnormality seen.

## 2021-05-10 DIAGNOSIS — J449 Chronic obstructive pulmonary disease, unspecified: Secondary | ICD-10-CM | POA: Diagnosis not present

## 2021-05-13 DIAGNOSIS — J449 Chronic obstructive pulmonary disease, unspecified: Secondary | ICD-10-CM | POA: Diagnosis not present

## 2021-05-16 DIAGNOSIS — J9621 Acute and chronic respiratory failure with hypoxia: Secondary | ICD-10-CM | POA: Diagnosis not present

## 2021-05-16 DIAGNOSIS — J9622 Acute and chronic respiratory failure with hypercapnia: Secondary | ICD-10-CM | POA: Diagnosis not present

## 2021-05-16 DIAGNOSIS — J449 Chronic obstructive pulmonary disease, unspecified: Secondary | ICD-10-CM | POA: Diagnosis not present

## 2021-06-04 IMAGING — DX DG CHEST 1V PORT
1 series · 1 of 1 positions shown · non-contrast
Comparison: 01/22/2020

CLINICAL DATA: Shortness of breath. History of COPD.

EXAM:
PORTABLE CHEST 1 VIEW

[chest ap]
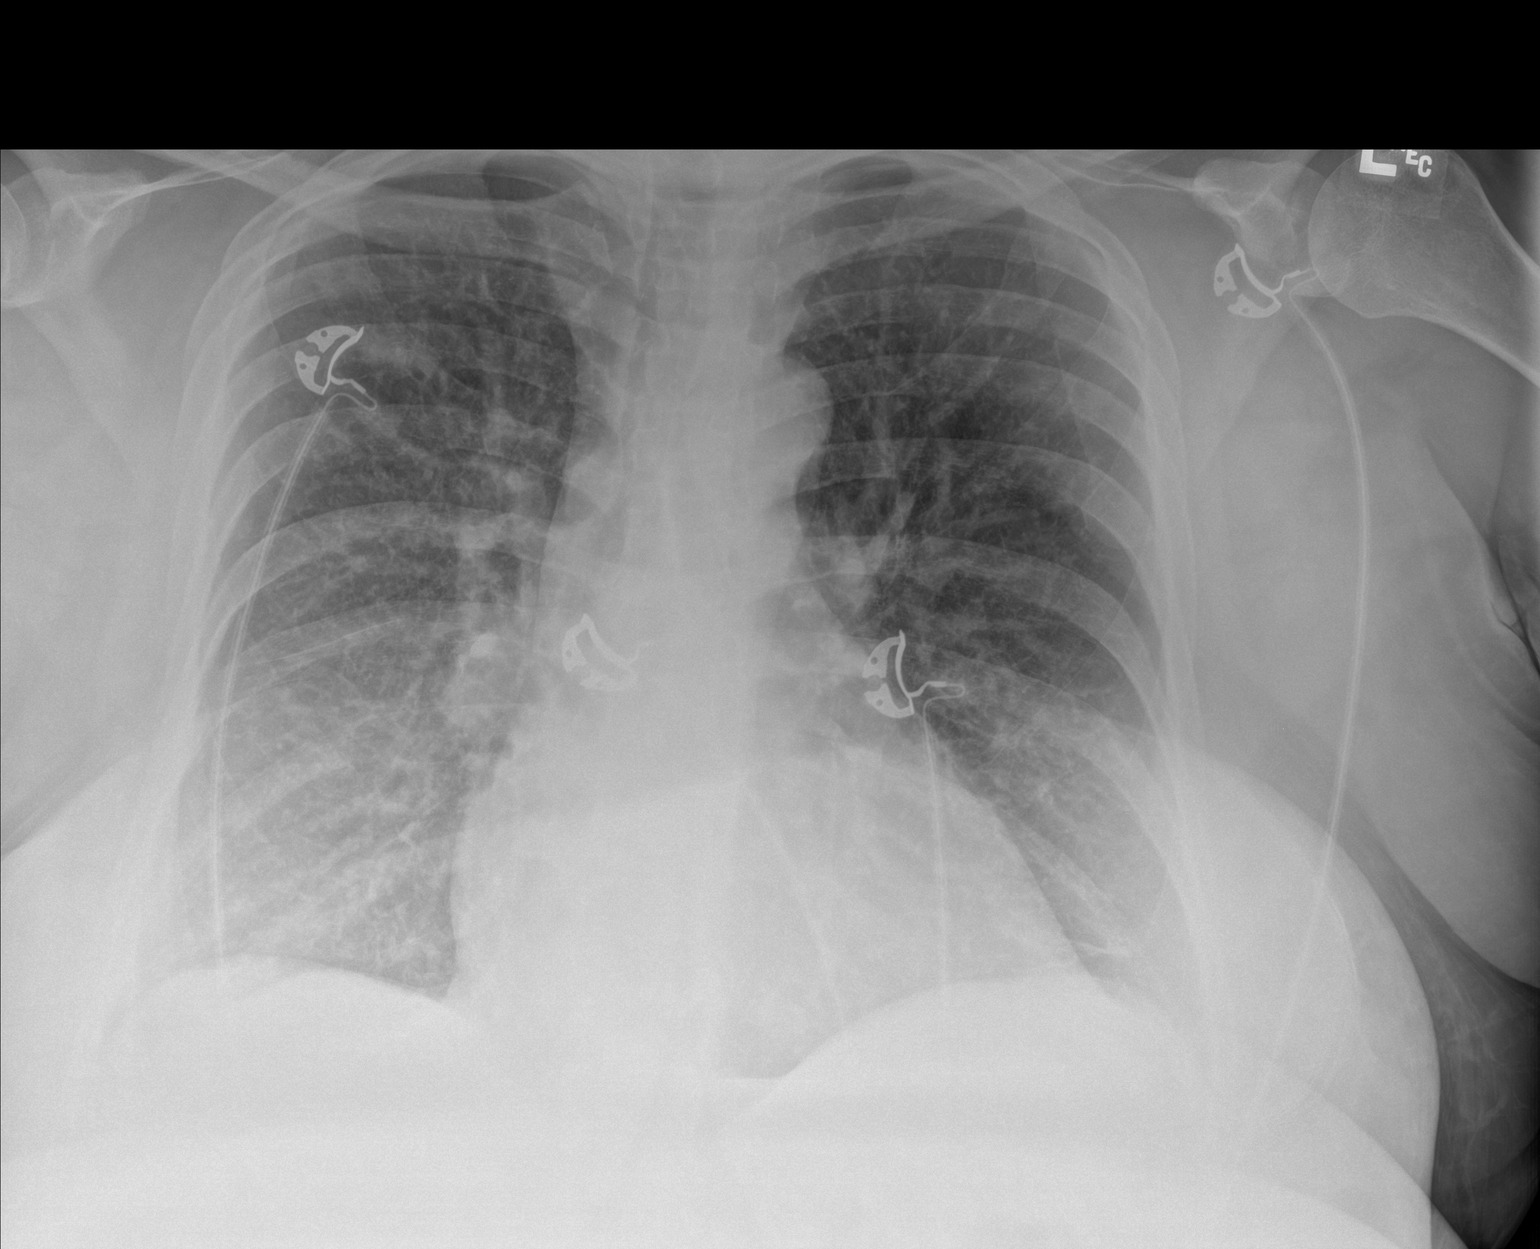

[1 of 1 positions shown; findings below may reference images not displayed]

FINDINGS: The cardiomediastinal silhouette is unchanged with normal heart
size. Peribronchial thickening and accentuation of the interstitial
markings are stable to mildly increased compared to the prior study.
There is minimal scarring or atelectasis in the left lung base. No
pleural effusion or pneumothorax is identified. No acute osseous
abnormality is seen.
IMPRESSION: Stable to mildly increased bronchitic changes.

## 2021-06-10 DIAGNOSIS — J449 Chronic obstructive pulmonary disease, unspecified: Secondary | ICD-10-CM | POA: Diagnosis not present

## 2021-06-12 DIAGNOSIS — J449 Chronic obstructive pulmonary disease, unspecified: Secondary | ICD-10-CM | POA: Diagnosis not present

## 2021-06-15 DIAGNOSIS — J449 Chronic obstructive pulmonary disease, unspecified: Secondary | ICD-10-CM | POA: Diagnosis not present

## 2021-06-15 DIAGNOSIS — J9622 Acute and chronic respiratory failure with hypercapnia: Secondary | ICD-10-CM | POA: Diagnosis not present

## 2021-06-15 DIAGNOSIS — J9621 Acute and chronic respiratory failure with hypoxia: Secondary | ICD-10-CM | POA: Diagnosis not present

## 2021-06-26 IMAGING — DX DG CHEST 1V PORT
1 series · 1 of 1 positions shown · non-contrast
Comparison: Chest radiographs 02/03/2020 and earlier

CLINICAL DATA: Dyspnea, cough. Additional history provided:
Shortness of breath, history of COPD, chronically on supplemental
oxygen

EXAM:
PORTABLE CHEST 1 VIEW

[chest ap]
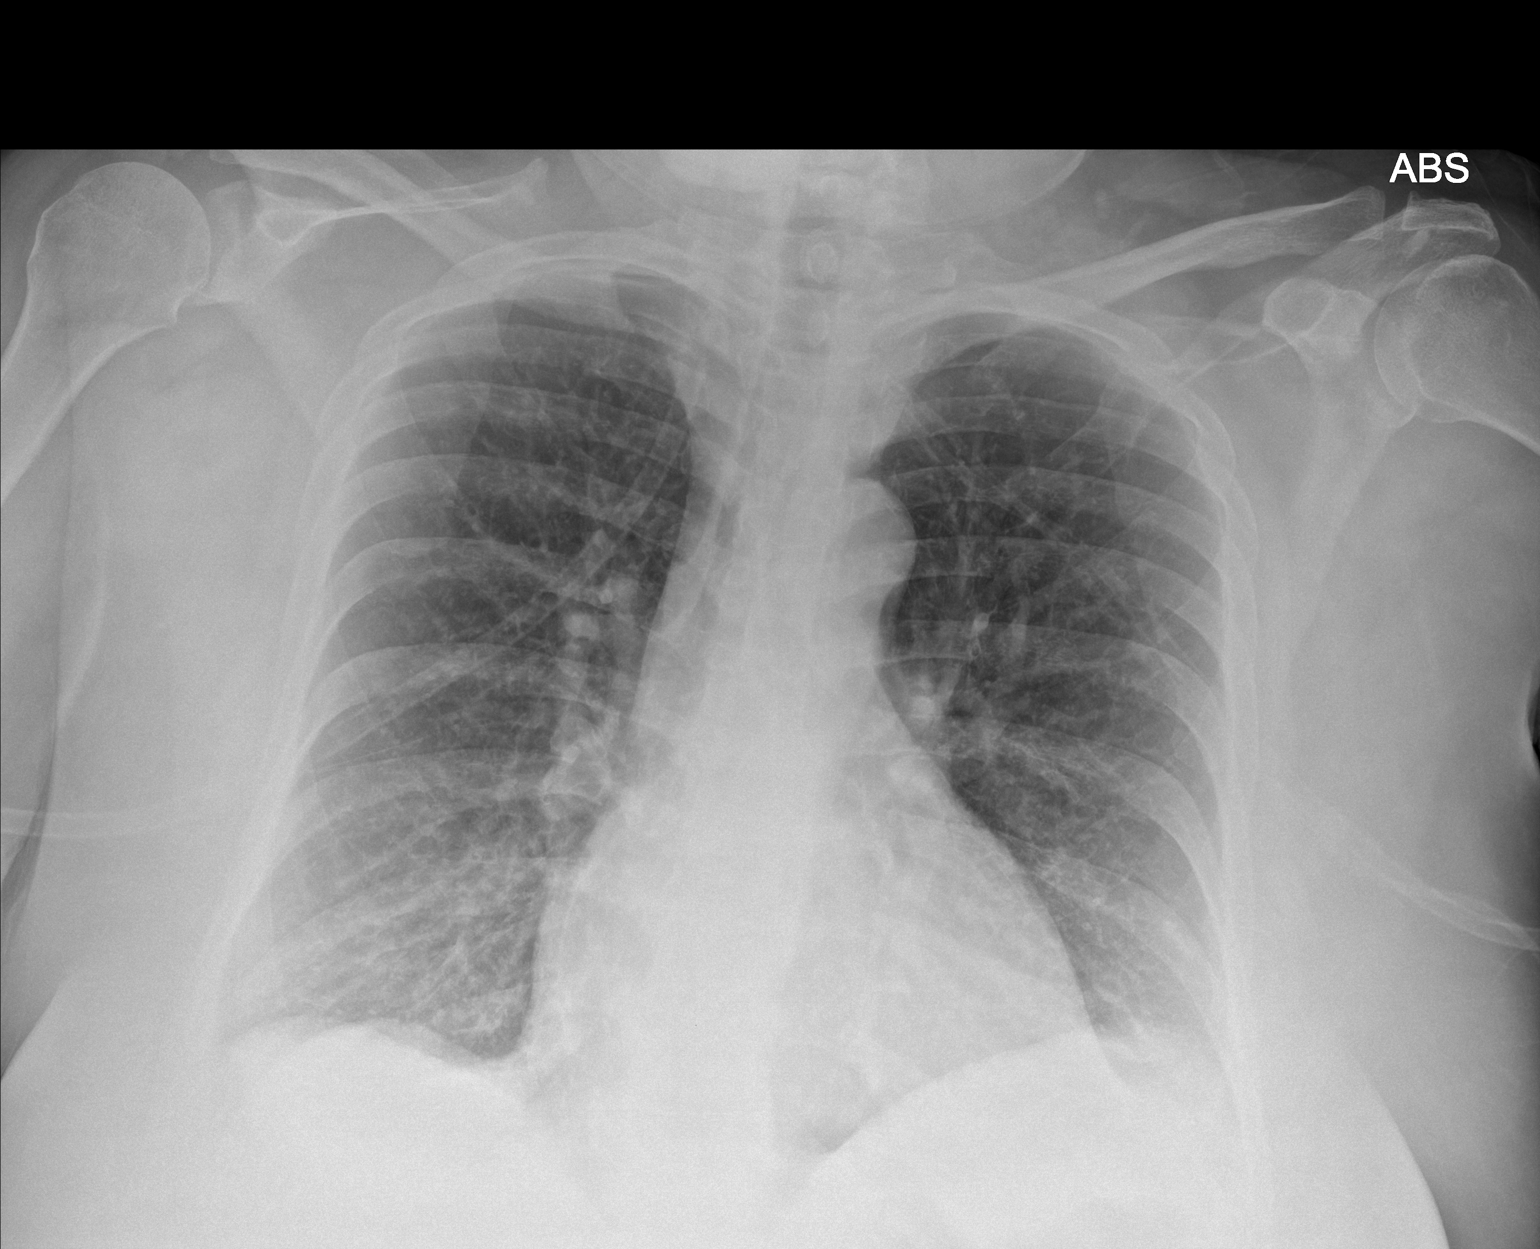

[1 of 1 positions shown; findings below may reference images not displayed]

FINDINGS: Heart size within normal limits. No significant interval change in
appearance of the lungs as compared to prior examination 02/03/2020.
As before, there is peribronchial thickening with accentuation of
the interstitial lung markings. No evidence of pleural effusion or
pneumothorax. No acute bony abnormality.
IMPRESSION: No significant interval change as compared to chest radiograph
02/03/2020.

Bronchitic changes with chronically prominent interstitial lung
markings.

## 2021-07-10 DIAGNOSIS — J449 Chronic obstructive pulmonary disease, unspecified: Secondary | ICD-10-CM | POA: Diagnosis not present

## 2021-07-13 DIAGNOSIS — J449 Chronic obstructive pulmonary disease, unspecified: Secondary | ICD-10-CM | POA: Diagnosis not present

## 2021-07-16 DIAGNOSIS — J449 Chronic obstructive pulmonary disease, unspecified: Secondary | ICD-10-CM | POA: Diagnosis not present

## 2021-07-16 DIAGNOSIS — J9621 Acute and chronic respiratory failure with hypoxia: Secondary | ICD-10-CM | POA: Diagnosis not present

## 2021-07-16 DIAGNOSIS — J9622 Acute and chronic respiratory failure with hypercapnia: Secondary | ICD-10-CM | POA: Diagnosis not present

## 2021-08-10 DIAGNOSIS — J449 Chronic obstructive pulmonary disease, unspecified: Secondary | ICD-10-CM | POA: Diagnosis not present

## 2021-08-13 DIAGNOSIS — J449 Chronic obstructive pulmonary disease, unspecified: Secondary | ICD-10-CM | POA: Diagnosis not present

## 2021-08-16 DIAGNOSIS — J9621 Acute and chronic respiratory failure with hypoxia: Secondary | ICD-10-CM | POA: Diagnosis not present

## 2021-08-16 DIAGNOSIS — J9622 Acute and chronic respiratory failure with hypercapnia: Secondary | ICD-10-CM | POA: Diagnosis not present

## 2021-08-16 DIAGNOSIS — J449 Chronic obstructive pulmonary disease, unspecified: Secondary | ICD-10-CM | POA: Diagnosis not present

## 2021-09-10 DIAGNOSIS — J449 Chronic obstructive pulmonary disease, unspecified: Secondary | ICD-10-CM | POA: Diagnosis not present

## 2021-09-12 DIAGNOSIS — J449 Chronic obstructive pulmonary disease, unspecified: Secondary | ICD-10-CM | POA: Diagnosis not present

## 2021-09-15 DIAGNOSIS — J449 Chronic obstructive pulmonary disease, unspecified: Secondary | ICD-10-CM | POA: Diagnosis not present

## 2021-09-15 DIAGNOSIS — J9621 Acute and chronic respiratory failure with hypoxia: Secondary | ICD-10-CM | POA: Diagnosis not present

## 2021-09-15 DIAGNOSIS — J9622 Acute and chronic respiratory failure with hypercapnia: Secondary | ICD-10-CM | POA: Diagnosis not present

## 2021-10-10 DIAGNOSIS — J449 Chronic obstructive pulmonary disease, unspecified: Secondary | ICD-10-CM | POA: Diagnosis not present

## 2021-10-13 DIAGNOSIS — J449 Chronic obstructive pulmonary disease, unspecified: Secondary | ICD-10-CM | POA: Diagnosis not present

## 2021-10-16 DIAGNOSIS — J9621 Acute and chronic respiratory failure with hypoxia: Secondary | ICD-10-CM | POA: Diagnosis not present

## 2021-10-16 DIAGNOSIS — J9622 Acute and chronic respiratory failure with hypercapnia: Secondary | ICD-10-CM | POA: Diagnosis not present

## 2021-10-16 DIAGNOSIS — J449 Chronic obstructive pulmonary disease, unspecified: Secondary | ICD-10-CM | POA: Diagnosis not present

## 2021-11-16 ENCOUNTER — Emergency Department: Payer: Medicare HMO

## 2021-11-16 ENCOUNTER — Emergency Department
Admission: EM | Admit: 2021-11-16 | Discharge: 2021-11-17 | Discharge status: Against Medical Advice | Payer: Medicare HMO | Attending: Emergency Medicine | Admitting: Emergency Medicine

## 2021-11-16 DIAGNOSIS — E119 Type 2 diabetes mellitus without complications: Secondary | ICD-10-CM | POA: Insufficient documentation

## 2021-11-16 DIAGNOSIS — Z7951 Long term (current) use of inhaled steroids: Secondary | ICD-10-CM | POA: Diagnosis not present

## 2021-11-16 DIAGNOSIS — Z20822 Contact with and (suspected) exposure to covid-19: Secondary | ICD-10-CM | POA: Insufficient documentation

## 2021-11-16 DIAGNOSIS — J441 Chronic obstructive pulmonary disease with (acute) exacerbation: Secondary | ICD-10-CM

## 2021-11-16 DIAGNOSIS — Z79899 Other long term (current) drug therapy: Secondary | ICD-10-CM | POA: Insufficient documentation

## 2021-11-16 DIAGNOSIS — F17210 Nicotine dependence, cigarettes, uncomplicated: Secondary | ICD-10-CM | POA: Insufficient documentation

## 2021-11-16 DIAGNOSIS — I129 Hypertensive chronic kidney disease with stage 1 through stage 4 chronic kidney disease, or unspecified chronic kidney disease: Secondary | ICD-10-CM | POA: Diagnosis not present

## 2021-11-16 DIAGNOSIS — R0602 Shortness of breath: Secondary | ICD-10-CM

## 2021-11-16 DIAGNOSIS — R0689 Other abnormalities of breathing: Secondary | ICD-10-CM | POA: Diagnosis not present

## 2021-11-16 DIAGNOSIS — J45909 Unspecified asthma, uncomplicated: Secondary | ICD-10-CM | POA: Insufficient documentation

## 2021-11-16 DIAGNOSIS — R062 Wheezing: Secondary | ICD-10-CM | POA: Diagnosis not present

## 2021-11-16 DIAGNOSIS — Z7984 Long term (current) use of oral hypoglycemic drugs: Secondary | ICD-10-CM | POA: Diagnosis not present

## 2021-11-16 DIAGNOSIS — N1831 Chronic kidney disease, stage 3a: Secondary | ICD-10-CM | POA: Diagnosis not present

## 2021-11-16 DIAGNOSIS — Z5321 Procedure and treatment not carried out due to patient leaving prior to being seen by health care provider: Secondary | ICD-10-CM | POA: Diagnosis not present

## 2021-11-16 DIAGNOSIS — R069 Unspecified abnormalities of breathing: Secondary | ICD-10-CM | POA: Diagnosis not present

## 2021-11-16 DIAGNOSIS — J9601 Acute respiratory failure with hypoxia: Secondary | ICD-10-CM

## 2021-11-16 LAB — CBC
HCT: 45.2 % (ref 36.0–46.0)
Hemoglobin: 14.4 g/dL (ref 12.0–15.0)
MCH: 28.8 pg (ref 26.0–34.0)
MCHC: 31.9 g/dL (ref 30.0–36.0)
MCV: 90.4 fL (ref 80.0–100.0)
Platelets: 409 10*3/uL — ABNORMAL HIGH (ref 150–400)
RBC: 5 MIL/uL (ref 3.87–5.11)
RDW: 13.2 % (ref 11.5–15.5)
WBC: 13.5 10*3/uL — ABNORMAL HIGH (ref 4.0–10.5)
nRBC: 0 % (ref 0.0–0.2)

## 2021-11-16 LAB — BLOOD GAS, VENOUS
Acid-Base Excess: 2.5 mmol/L — ABNORMAL HIGH (ref 0.0–2.0)
Bicarbonate: 27.9 mmol/L (ref 20.0–28.0)
FIO2: 0.21
O2 Saturation: 76.2 %
Patient temperature: 37
pCO2, Ven: 45 mmHg (ref 44.0–60.0)
pH, Ven: 7.4 (ref 7.250–7.430)
pO2, Ven: 41 mmHg (ref 32.0–45.0)

## 2021-11-16 LAB — BASIC METABOLIC PANEL
Anion gap: 6 (ref 5–15)
BUN: 37 mg/dL — ABNORMAL HIGH (ref 6–20)
CO2: 25 mmol/L (ref 22–32)
Calcium: 9 mg/dL (ref 8.9–10.3)
Chloride: 105 mmol/L (ref 98–111)
Creatinine, Ser: 1.69 mg/dL — ABNORMAL HIGH (ref 0.44–1.00)
GFR, Estimated: 35 mL/min — ABNORMAL LOW (ref >60)
Glucose, Bld: 132 mg/dL — ABNORMAL HIGH (ref 70–99)
Potassium: 4.4 mmol/L (ref 3.5–5.1)
Sodium: 136 mmol/L (ref 135–145)

## 2021-11-16 LAB — BRAIN NATRIURETIC PEPTIDE: B Natriuretic Peptide: 18.4 pg/mL (ref 0.0–100.0)

## 2021-11-16 LAB — TROPONIN I (HIGH SENSITIVITY): Troponin I (High Sensitivity): 4 ng/L (ref <18)

## 2021-11-16 MED ORDER — SODIUM CHLORIDE 0.9 % IV SOLN
2.0000 g | Freq: Once | INTRAVENOUS | Status: AC
  Administered 2021-11-17: 2 g via INTRAVENOUS
  Filled 2021-11-16: qty 20

## 2021-11-16 MED ORDER — IPRATROPIUM-ALBUTEROL 0.5-2.5 (3) MG/3ML IN SOLN
3.0000 mL | Freq: Once | RESPIRATORY_TRACT | Status: AC
  Administered 2021-11-16: 3 mL via RESPIRATORY_TRACT

## 2021-11-16 MED ORDER — MAGNESIUM SULFATE 2 GM/50ML IV SOLN
2.0000 g | Freq: Once | INTRAVENOUS | Status: AC
  Administered 2021-11-16: 2 g via INTRAVENOUS

## 2021-11-16 MED ORDER — IPRATROPIUM-ALBUTEROL 0.5-2.5 (3) MG/3ML IN SOLN: 3.0000 mL | Freq: Once | RESPIRATORY_TRACT | Status: AC

## 2021-11-16 MED ORDER — IPRATROPIUM-ALBUTEROL 0.5-2.5 (3) MG/3ML IN SOLN
RESPIRATORY_TRACT | Status: AC
  Administered 2021-11-16: 3 mL via RESPIRATORY_TRACT
  Filled 2021-11-16: qty 9

## 2021-11-16 MED ORDER — METHYLPREDNISOLONE SODIUM SUCC 125 MG IJ SOLR: 125.0000 mg | Freq: Once | INTRAMUSCULAR | Status: AC

## 2021-11-16 MED ORDER — METHYLPREDNISOLONE SODIUM SUCC 125 MG IJ SOLR
INTRAMUSCULAR | Status: AC
  Administered 2021-11-16: 125 mg via INTRAVENOUS
  Filled 2021-11-16: qty 2

## 2021-11-16 MED ORDER — MAGNESIUM SULFATE 2 GM/50ML IV SOLN
INTRAVENOUS | Status: AC
  Filled 2021-11-16: qty 50

## 2021-11-16 NOTE — ED Provider Notes (Signed)
[facility] Emergency Department Provider Note  ____________________________________________  Time seen: Approximately 11:09 PM  I have reviewed the triage vital signs and the nursing notes.   HISTORY  Chief Complaint Shortness of Breath and Wheezing   HPI Samantha [name] is a 55 y.o. female with a history of asthma, COPD, diabetes, hypertension, obesity, apnea who presents for evaluation of shortness of breath.  Patient reports that she has had a cough productive of yellow phlegm over the last 2 days with wheezing progressively worsening shortness of breath.  Has been using her inhalers at home.  This evening her shortness of breath became more severe.  She is complaining of chest tightness but denies pleuritic chest pain, denies fever, chills, body aches, sore throat, vomiting or diarrhea.  No personal or family history of PE or DVT, no recent travel immobilization, no leg pain or swelling, no hemoptysis, no exogenous hormones of cancer   Past Medical History:  Diagnosis Date   Acute on chronic respiratory failure with hypoxemia ([place]) 08/30/2015   Asthma    COPD (chronic obstructive pulmonary disease) ([place])    Diabetes mellitus without complication ([place])    Hypertension     Patient Active Problem List   Diagnosis Date Noted   Type II diabetes mellitus with renal manifestations ([place]) 07/22/2020   GERD (gastroesophageal reflux disease) 07/22/2020   Hypertensive urgency 07/22/2020   CKD (chronic kidney disease), stage IIIa 07/22/2020   HTN (hypertension) 07/22/2020   Ventral hernia 07/22/2020   Ventral hernia with obstruction and without gangrene 09/10/2019   Acute respiratory failure with hypoxemia ([place]) 11/25/2018   COPD exacerbation ([place]) 02/21/2018   Acute on chronic respiratory failure ([place]) 10/06/2015   Acute on chronic respiratory failure with hypoxemia ([place]) 08/30/2015    Past Surgical History:  Procedure Laterality Date   none       Prior to Admission medications   Medication Sig Start Date End Date Taking? Authorizing Provider  amoxicillin-clavulanate (AUGMENTIN) 875-125 MG tablet Take 1 tablet by mouth 2 (two) times daily for 7 days. 11/17/21 11/24/21 Yes Biridiana Twardowski, [place], MD  predniSONE (DELTASONE) 20 MG tablet Take 3 tablets (60 mg total) by mouth daily with breakfast for 4 days. 11/17/21 11/21/21 Yes Zariah Cavendish, [place], MD  albuterol (PROVENTIL HFA) 108 (90 Base) MCG/ACT inhaler Inhale 2 puffs into the lungs every 4 (four) hours as needed for wheezing or shortness of breath. 07/24/20 08/23/20  Wyvonnia Dusky, MD  albuterol (PROVENTIL) (2.5 MG/3ML) 0.083% nebulizer solution Take 3 mLs (2.5 mg total) by nebulization every 4 (four) hours as needed for wheezing or shortness of breath. 07/24/20 08/23/20  Wyvonnia Dusky, MD  budesonide-formoterol [facility]) 160-4.5 MCG/ACT inhaler Inhale 2 puffs into the lungs 2 (two) times daily. 07/24/20 08/23/20  Wyvonnia Dusky, MD  gabapentin (NEURONTIN) 300 MG capsule Take 300 mg by mouth 3 (three) times daily.     [provider]  guaiFENesin (ROBITUSSIN) 100 MG/5ML SOLN Take 5 mLs (100 mg total) by mouth every 4 (four) hours as needed for cough or to loosen phlegm. 02/25/20   Carrie Mew, MD  ipratropium-albuterol (DUONEB) 0.5-2.5 (3) MG/3ML SOLN Take 3 mLs by nebulization every 6 (six) hours as needed (shortness of breath). 07/24/20 08/23/20  Wyvonnia Dusky, MD  losartan-hydrochlorothiazide (HYZAAR) 50-12.5 MG tablet Take 1 tablet by mouth daily. 02/25/20   Carrie Mew, MD  metFORMIN (GLUCOPHAGE) 850 MG tablet Take 850 mg by mouth 2 (two) times daily with a meal.  [provider]  montelukast (SINGULAIR) 10 MG tablet Take 10 mg by mouth at bedtime.     [provider]  omeprazole (PRILOSEC) 20 MG capsule Take 20 mg by mouth daily.  05/05/19   [provider]  tiotropium (SPIRIVA) 18 MCG inhalation capsule Place 18 mcg into  inhaler and inhale daily.    [provider]    Allergies Percocet [oxycodone-acetaminophen]  Family History  Problem Relation Age of Onset   Asthma Mother    Diabetes Mother    Hypertension Mother    Asthma Sister    Heart attack Father     Social History Social History   Tobacco Use   Smoking status: Every Day    Packs/day: 0.10    Years: 23.00    Pack years: 2.30    Types: Cigarettes   Smokeless tobacco: Never  Vaping Use   Vaping Use: Never used  Substance Use Topics   Alcohol use: Yes    Alcohol/week: 1.0 standard drink    Types: 1 Cans of beer per week   Drug use: Yes    Types: Cocaine    Comment: x 1 week ago    Review of Systems  Constitutional: Negative for fever. Eyes: Negative for visual changes. ENT: Negative for sore throat. Neck: No neck pain  Cardiovascular: Negative for chest pain. + chest tightness Respiratory: + shortness of breath, cough, wheezing Gastrointestinal: Negative for abdominal pain, vomiting or diarrhea. Genitourinary: Negative for dysuria. Musculoskeletal: Negative for back pain. Skin: Negative for rash. Neurological: Negative for headaches, weakness or numbness. Psych: No SI or HI  ____________________________________________   PHYSICAL EXAM:  VITAL SIGNS: ED Triage Vitals  Enc Vitals Group     BP 11/16/21 2242 (!) 161/117     Pulse Rate 11/16/21 2242 100     Resp 11/16/21 2242 (!) 28     Temp 11/16/21 2242 97.9 F (36.6 C)     Temp Source 11/16/21 2242 Oral     SpO2 11/16/21 2242 97 %     Weight 11/16/21 2243 267 lb (121.1 kg)     Height --      Head Circumference --      Peak Flow --      Pain Score --      Pain Loc --      Pain Edu? --      Excl. in [place]? --     Constitutional: Alert and oriented, moderate respiratory distress.  HEENT:      Head: Normocephalic and atraumatic.         Eyes: Conjunctivae are normal. Sclera is non-icteric.       Mouth/Throat: Mucous membranes are moist.       Neck:  Supple with no signs of meningismus. Cardiovascular: Regular rate and rhythm. No murmurs, gallops, or rubs. 2+ symmetrical distal pulses are present in all extremities. No JVD. Respiratory: Tachypneic with retractions, increased work of breathing, decreased air movement bilaterally and wheezing, no hypoxia Gastrointestinal: Soft, non tender, and non distended with positive bowel sounds. No rebound or guarding. Genitourinary: No CVA tenderness. Musculoskeletal:  No edema, cyanosis, or erythema of extremities. Neurologic: Normal speech and language. Face is symmetric. Moving all extremities. No gross focal neurologic deficits are appreciated. Skin: Skin is warm, dry and intact. No rash noted. Psychiatric: Mood and affect are normal. Speech and behavior are normal.  ____________________________________________   LABS (all labs ordered are listed, but only abnormal results are displayed)  Labs Reviewed  BASIC  METABOLIC PANEL - Abnormal; Notable for the following components:      Result Value   Glucose, Bld 132 (*)    BUN 37 (*)    Creatinine, Ser 1.69 (*)    GFR, Estimated 35 (*)    All other components within normal limits  CBC - Abnormal; Notable for the following components:   WBC 13.5 (*)    Platelets 409 (*)    All other components within normal limits  BLOOD GAS, VENOUS - Abnormal; Notable for the following components:   Acid-Base Excess 2.5 (*)    All other components within normal limits  RESP PANEL BY RT-PCR (FLU A&B, COVID) ARPGX2  BRAIN NATRIURETIC PEPTIDE  PROCALCITONIN  POC URINE PREG, ED  TROPONIN I (HIGH SENSITIVITY)   ____________________________________________  EKG  ED ECG REPORT I, Nita Sickle, the attending physician, personally viewed and interpreted this ECG.  Sinus rhythm with a rate of 97, normal intervals, normal axis, T wave inversions in inferior leads with no ST elevations. ____________________________________________  RADIOLOGY  I have  personally reviewed the images performed during this visit and I agree with the Radiologist's read.   Interpretation by Radiologist:  DG Chest 1 View  Result Date: 11/16/2021 CLINICAL DATA:  Shortness of breath and wheezing EXAM: CHEST  1 VIEW COMPARISON:  07/23/2019 FINDINGS: The heart size and mediastinal contours are within normal limits. Both lungs are clear. The visualized skeletal structures are unremarkable. IMPRESSION: No active disease. Electronically Signed   By: Alcide Clever M.D.   On: 11/16/2021 23:05     ____________________________________________   PROCEDURES  Procedure(s) performed: yes .1-3 Lead EKG Interpretation Performed by: Nita Sickle, MD Authorized by: Nita Sickle, MD     Interpretation: non-specific     ECG rate assessment: normal     Rhythm: sinus rhythm     Ectopy: none     Conduction: normal     Critical Care performed: yes  CRITICAL CARE Performed by: Nita Sickle  ?  Total critical care time: 30 min  Critical care time was exclusive of separately billable procedures and treating other patients.  Critical care was necessary to treat or prevent imminent or life-threatening deterioration.  Critical care was time spent personally by me on the following activities: development of treatment plan with patient and/or surrogate as well as nursing, discussions with consultants, evaluation of patient's response to treatment, examination of patient, obtaining history from patient or surrogate, ordering and performing treatments and interventions, ordering and review of laboratory studies, ordering and review of radiographic studies, pulse oximetry and re-evaluation of patient's condition.  ____________________________________________   INITIAL IMPRESSION / ASSESSMENT AND PLAN / ED COURSE  55 y.o. female with a history of asthma, COPD, diabetes, hypertension, obesity, apnea who presents for evaluation of shortness of breath, wheezing,  chest tightness in the setting of 2 days of a cough productive of yellow phlegm.  Patient arrives in mild to moderate respiratory distress, tachypneic with increased work of breathing, retractions but not hypoxic, she has diminished air movement bilaterally with expiratory wheezes.  Patient placed on BiPAP upon arrival.  Differential diagnoses including COPD exacerbation versus asthma exacerbation versus bronchitis versus pneumonia versus pneumothorax versus COVID versus flu versus PE versus myocarditis versus pericarditis.  We will get VBG, labs, procalcitonin.  We will start patient on DuoNeb's x3, Solu-Medrol and IV magnesium.  We will get a chest x-ray.  Patient placed on telemetry for close monitoring of cardiorespiratory status.  Old medical records reviewed including patient's last  visit with her pulmonologist Dr. Raul Del on March 2022  _________________________ 12:09 AM on 11/17/2021 ----------------------------------------- Patient reports feeling markedly improved and requesting that Bipap be removed. Will trial. CXR negative for edema, PNA, PTX. Covid and flu neg. Trop, BNP, VBG all WNL. Slightly elevated WBC and platelets most likely reactive from a viral syndrome. Procal neg. Repeat troponin pending. Will continue to monitor WOB closely.     _________________________ 1:00 AM on 11/17/2021 ----------------------------------------- Patient has been off of Bipap for 1 hour and remains well appearing with normal WOB and normal sats. I recommended admission especially considering patient was initially on Bipap for increased WOB. Patient is adamant about going home. Patient reports that he ride is here and she is ready to go. Will not let us get ambulatory sats or a repeat troponin. I explained to the patient that without full evaluation and ambulatory stas there is a chance we can miss something more life threatening. I also explained the importance of getting ambulatory sats to make sure she was  not hypoxic. Patient reports that "it ain't nothing wrong with my heart, I can tell and I got oxygen at home if I need." Patient understands the risks associated with leaving the hospital including worsening respiratory distress, hypoxia, respiratory failure, cardiac arrest. Patient will be discharged against medical advice. I am providing her with prescriptions for augmentin and prednisone. Recommended close f/u with PCP and patient is welcome to return if she changes her mind and wishes to continue her treatment.  _____________________________________________ Please note:  Patient was evaluated in Emergency Department today for the symptoms described in the history of present illness. Patient was evaluated in the context of the global COVID-19 pandemic, which necessitated consideration that the patient might be at risk for infection with the SARS-CoV-2 virus that causes COVID-19. Institutional protocols and algorithms that pertain to the evaluation of patients at risk for COVID-19 are in a state of rapid change based on information released by regulatory bodies including the CDC and federal and state organizations. These policies and algorithms were followed during the patient's care in the ED.  Some ED evaluations and interventions may be delayed as a result of limited staffing during the pandemic.   [place] Controlled Substance Database was reviewed by me. ____________________________________________   FINAL CLINICAL IMPRESSION(S) / ED DIAGNOSES   Final diagnoses:  SOB (shortness of breath)  COPD exacerbation (HCC)      NEW MEDICATIONS STARTED DURING THIS VISIT:  ED Discharge Orders          Ordered    predniSONE (DELTASONE) 20 MG tablet  Daily with breakfast        11/17/21 0059    amoxicillin-clavulanate (AUGMENTIN) 875-125 MG tablet  2 times daily        11/17/21 0059             Note:  This document was prepared using Dragon voice recognition software and may include  unintentional dictation errors.    Rudene Re, MD 11/17/21 [phone]

## 2021-11-16 NOTE — ED Notes (Signed)
Pt with hx frequent intubations; acuity level changed and charge nurse notified

## 2021-11-16 NOTE — ED Triage Notes (Signed)
Pt in via [facility] with sob, wheezing since yesterday. Hx of COPD, asthma, wears 2LNC at home. States she has been taking numerous breathing trxs at home today with little relief. Wheezing with exertion, states chest feels tight. Sats 97% in triage on [facility]

## 2021-11-16 NOTE — ED Notes (Signed)
Pt roomed, IV started, monitoring applied

## 2021-11-17 DIAGNOSIS — J441 Chronic obstructive pulmonary disease with (acute) exacerbation: Secondary | ICD-10-CM | POA: Diagnosis not present

## 2021-11-17 LAB — PROCALCITONIN: Procalcitonin: <0.1 ng/mL

## 2021-11-17 LAB — RESP PANEL BY RT-PCR (FLU A&B, COVID) ARPGX2
Influenza A by PCR: NEGATIVE
Influenza B by PCR: NEGATIVE
SARS Coronavirus 2 by RT PCR: NEGATIVE

## 2021-11-17 MED ORDER — PREDNISONE 20 MG PO TABS: 60.0000 mg | ORAL_TABLET | Freq: Every day | ORAL | 0 refills | Status: AC

## 2021-11-17 MED ORDER — AMOXICILLIN-POT CLAVULANATE 875-125 MG PO TABS: 1.0000 | ORAL_TABLET | Freq: Two times a day (BID) | ORAL | 0 refills | Status: AC

## 2021-11-17 NOTE — ED Notes (Signed)
Pt refused repeat troponin. MD in room to discuss plan of care and pt insisting on leaving AMA. Pt given discharge paperwork and follow-up care discussed - AMA form signed by pt.

## 2021-11-21 IMAGING — DX DG CHEST 1V PORT
1 series · 1 of 1 positions shown · non-contrast
Comparison: 02/25/2020

CLINICAL DATA: Shortness of breath.

EXAM:
PORTABLE CHEST 1 VIEW

[chest ap]
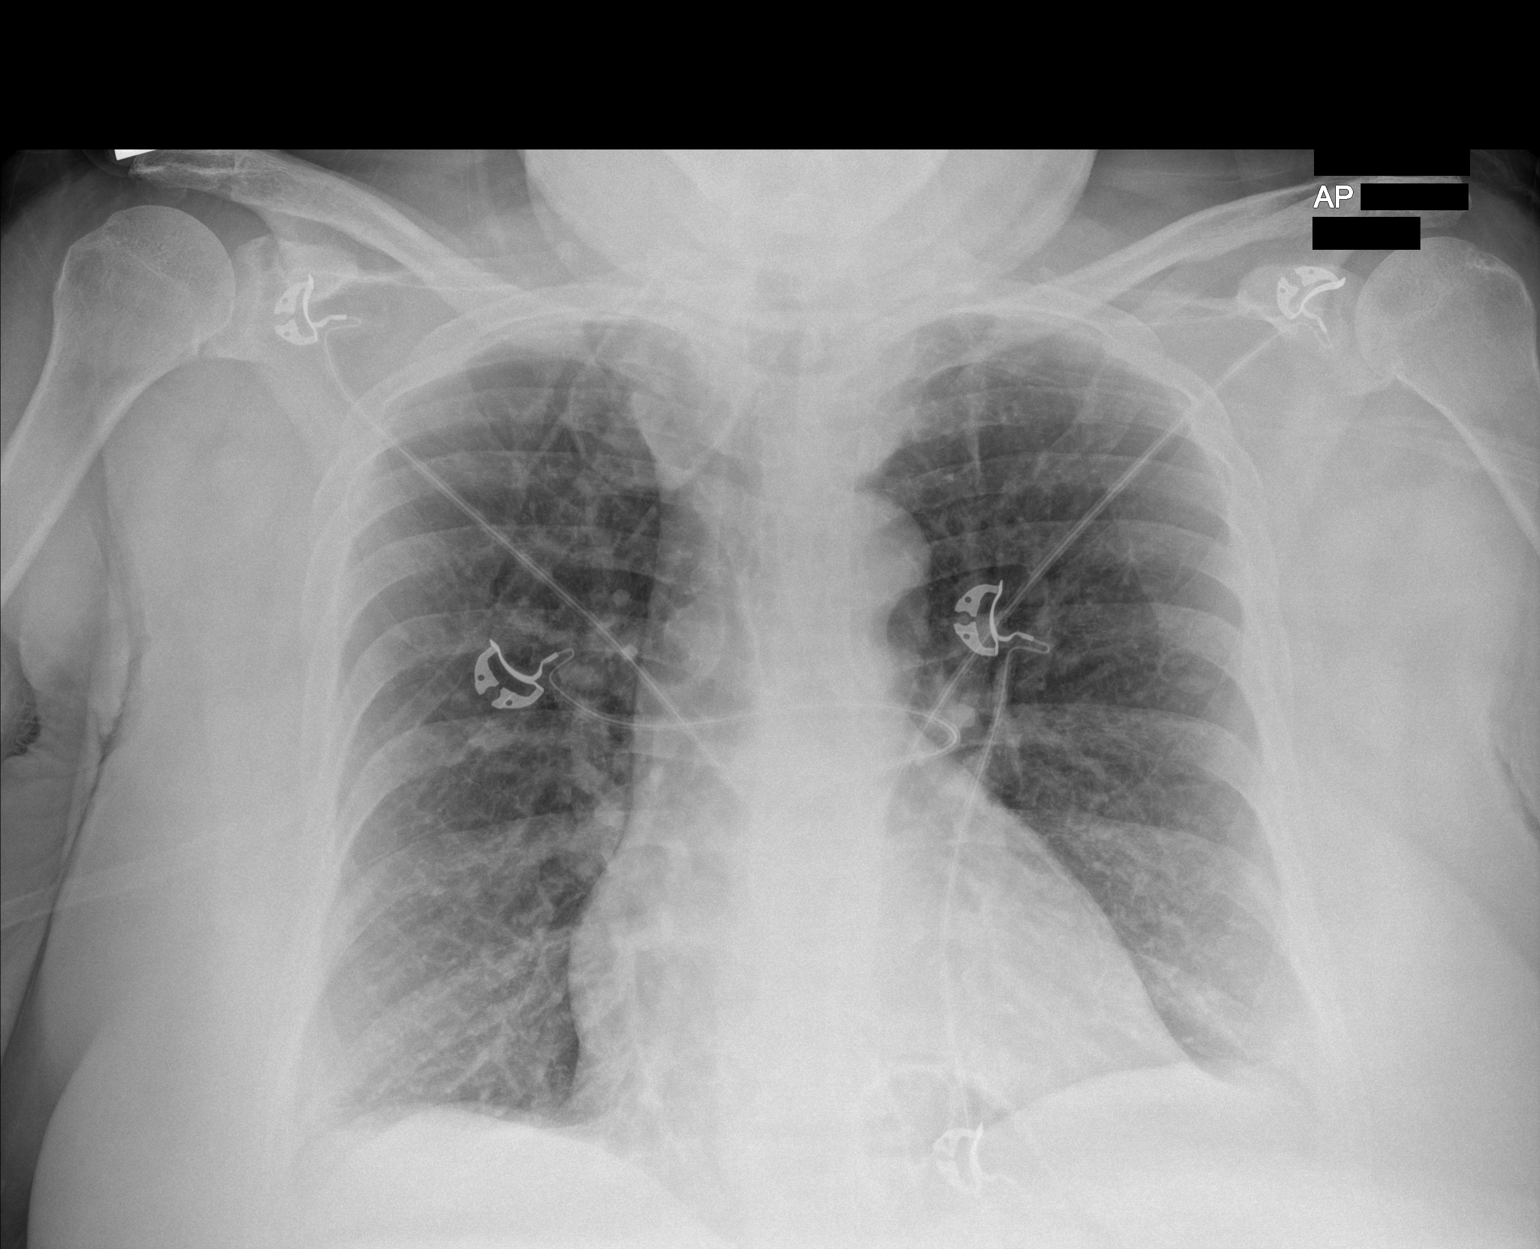

[1 of 1 positions shown; findings below may reference images not displayed]

FINDINGS: The cardiac silhouette, mediastinal and hilar contours are within
normal limits. The lungs are clear. No pleural effusion. No
pulmonary lesions. The bony thorax is intact.
IMPRESSION: No acute cardiopulmonary findings.

## 2021-11-21 IMAGING — CT CT ABD-PELV W/O CM
2 of 4 series · 16 of 46 positions shown, 18 images · non-contrast
Comparison: CT abdomen pelvis dated 09/10/2019.

CLINICAL DATA: 54-year-old female with abdominal pain. Concern for
hernia.

EXAM:
CT ABDOMEN AND PELVIS WITHOUT CONTRAST
TECHNIQUE: Multidetector CT imaging of the abdomen and pelvis was performed
following the standard protocol without IV contrast.

[Series 2: routine abd/pel wo · axial · 0.98mm/px · z∈[-541,-101]mm · 13 of 96 slices shown, 15 images]
[im 4/96  soft-tissue]
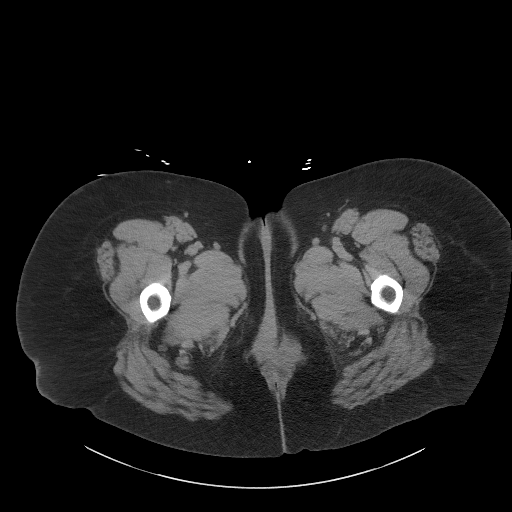
[im 4/96  bone]
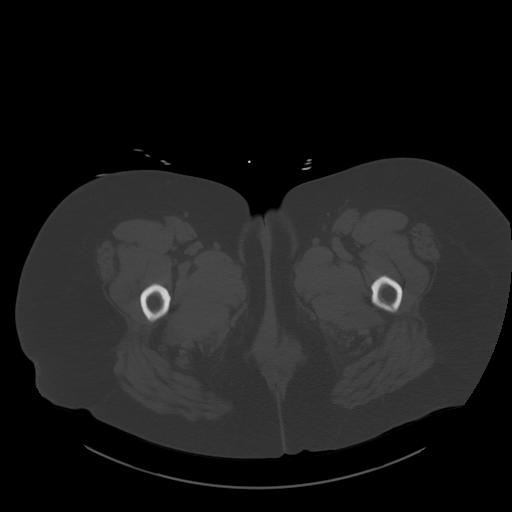
[im 11/96  soft-tissue]
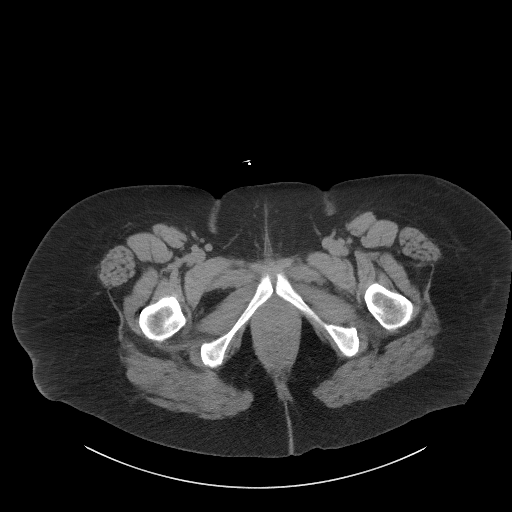
[im 19/96  soft-tissue]
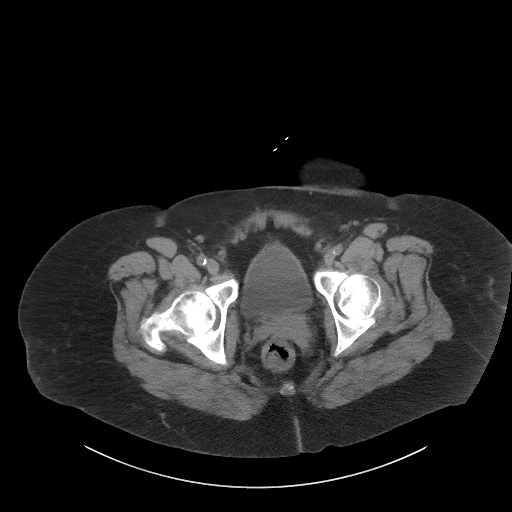
[im 26/96  soft-tissue]
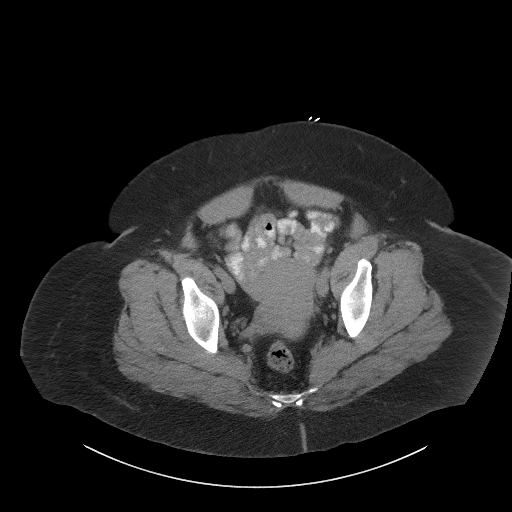
[im 33/96  soft-tissue]
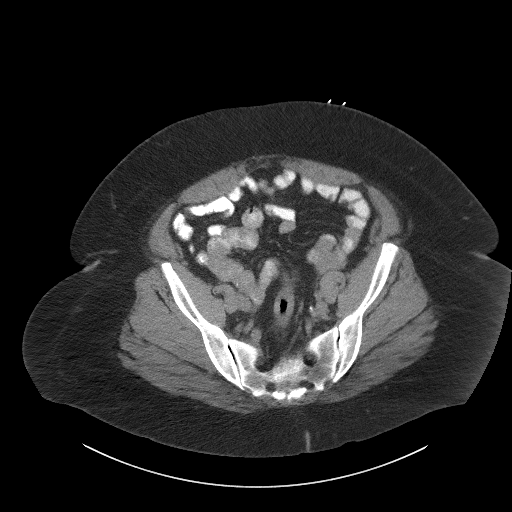
[im 41/96  soft-tissue]
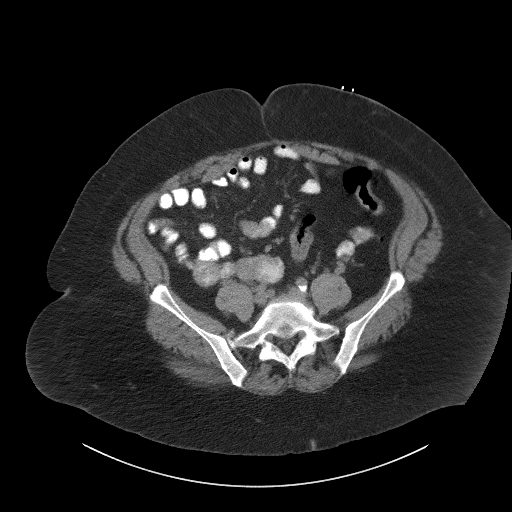
[im 48/96  soft-tissue]
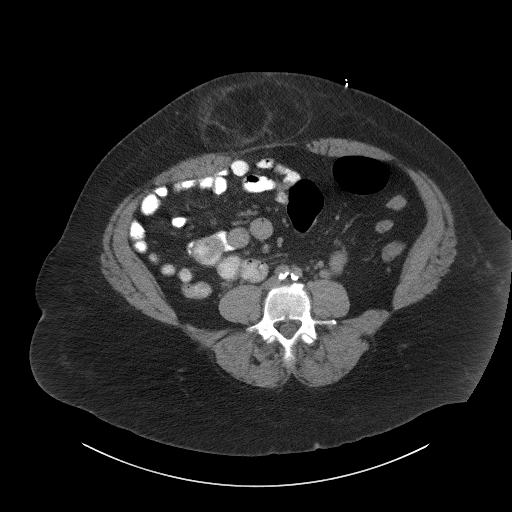
[im 55/96  soft-tissue]
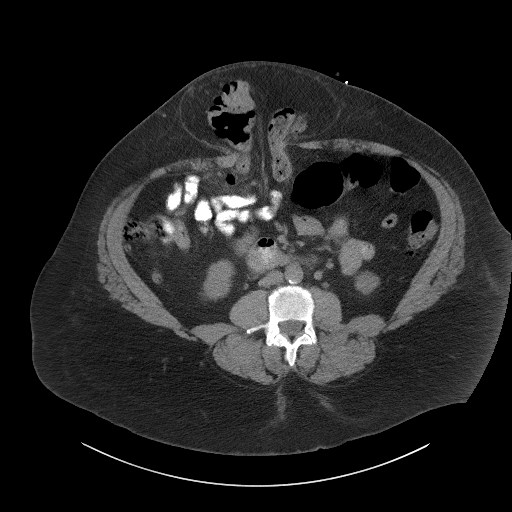
[im 63/96  soft-tissue]
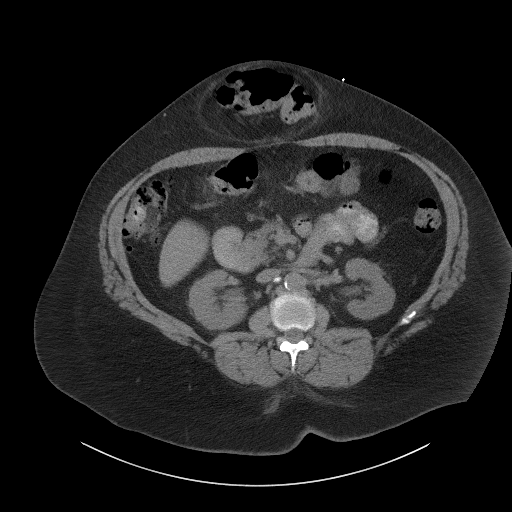
[im 63/96  bone]
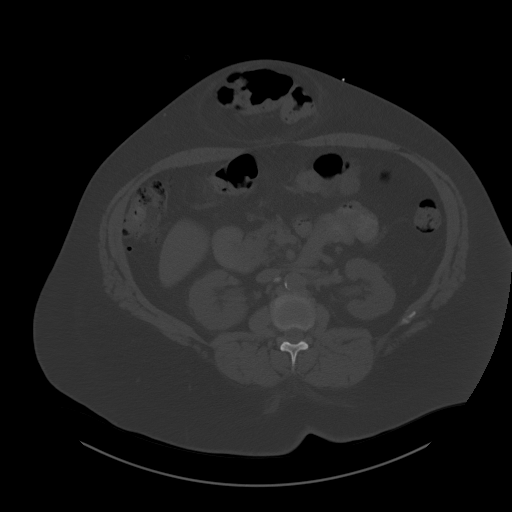
[im 70/96  soft-tissue]
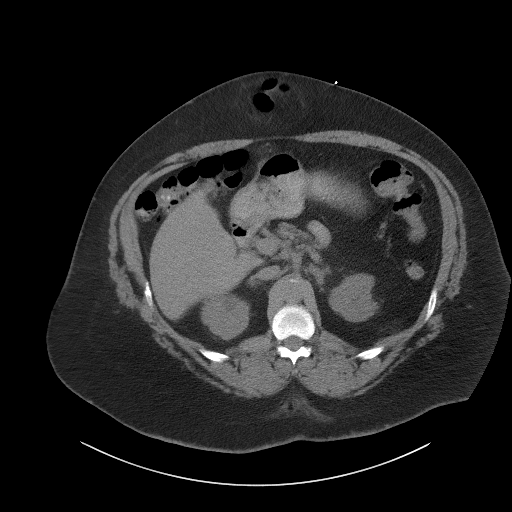
[im 77/96  soft-tissue]
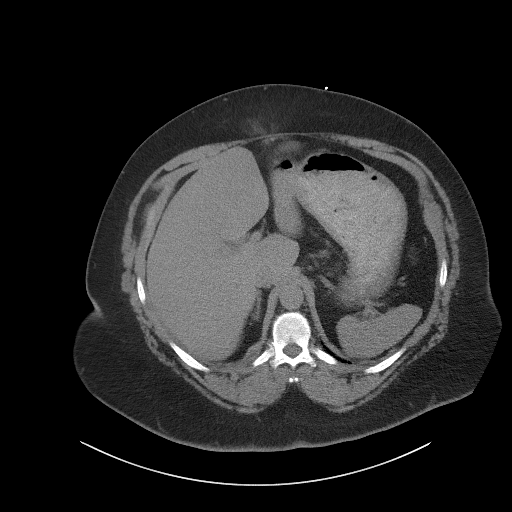
[im 85/96  soft-tissue]
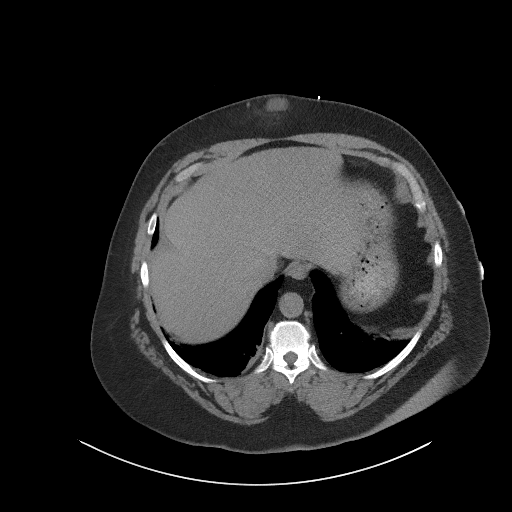
[im 92/96  soft-tissue]
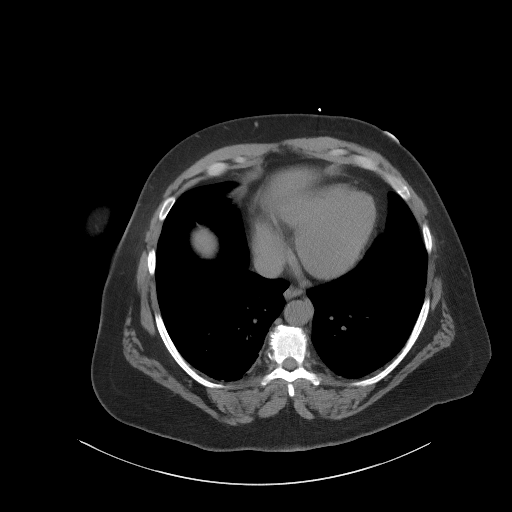

[Series 5: coronal st · coronal · 0.89mm/px · 3 of 126 slices shown]
[im 42/126  soft-tissue]
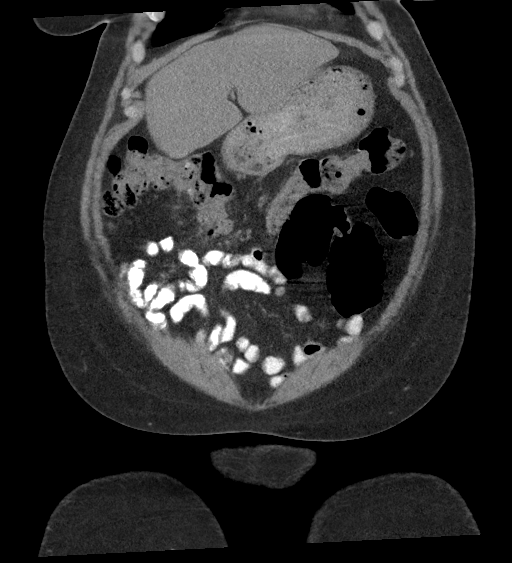
[im 56/126  soft-tissue]
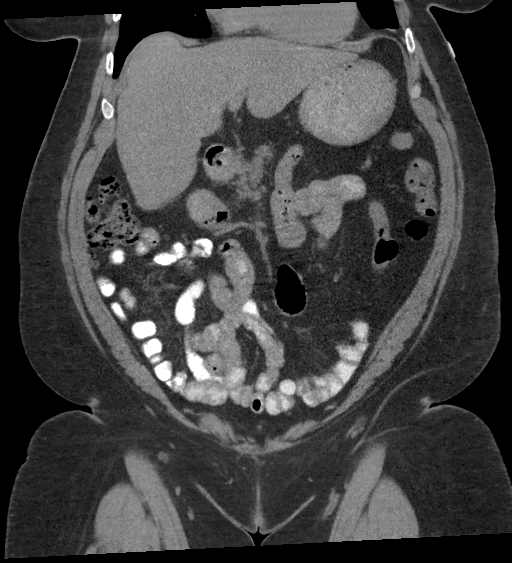
[im 70/126  soft-tissue]
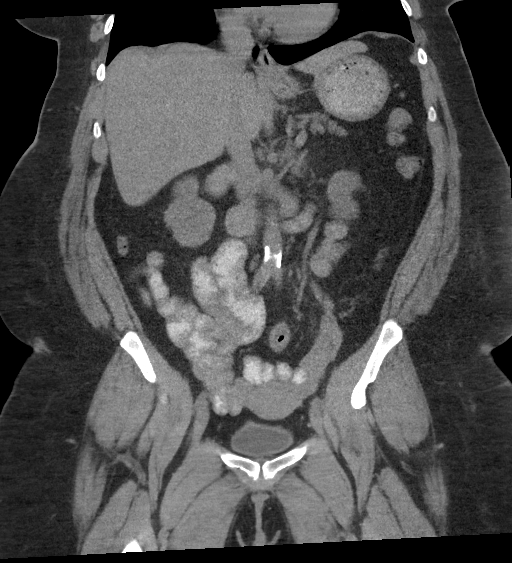

[16 of 46 positions shown; findings below may reference images not displayed]

FINDINGS: Evaluation of this exam is limited in the absence of intravenous
contrast.

Lower chest: There are minimal bibasilar atelectasis.

No intra-abdominal free air or free fluid.

Hepatobiliary: There is fatty infiltration of the liver. No
intrahepatic biliary dilatation. Cholecystectomy.

Pancreas: Unremarkable. No pancreatic ductal dilatation or
surrounding inflammatory changes.

Spleen: Normal in size without focal abnormality.

Adrenals/Urinary Tract: The adrenal glands unremarkable. Mild
bilateral renal atrophy and cortical irregularity and lobulation.
There is no hydronephrosis or nephrolithiasis on either side. The
visualized ureters and urinary bladder appear unremarkable.

Stomach/Bowel: There is herniation of a segment of the transverse
colon into a ventral supraumbilical hernia. No associated
obstruction. There is minimal stranding of the fat within the
hernia, possibly chronic. No significant inflammatory changes. There
is colonic diverticulosis. There is focal inflammatory changes of
the transverse colon just proximal to the neck of the hernia which
may be reactive or represent acute diverticulitis. There is no bowel
obstruction. The appendix is normal.

Vascular/Lymphatic: Moderate aortoiliac atherosclerotic disease. The
IVC is unremarkable. No portal venous gas. There is no adenopathy.

Reproductive: The uterus and ovaries are grossly unremarkable.

Other: The neck of the aforementioned large supraumbilical hernia
which contains a segment of transverse colon measures approximately
6 cm in diameter.

Additionally there is a smaller fat containing midline ventral
supraumbilical hernia more superiorly anterior to the liver with
small amount of fluid within the hernia. Slight interval decrease in
the amount of fluid within the hernia compared to the prior CT.

Musculoskeletal: Degenerative changes. No acute osseous pathology.
Avascular necrosis of the femoral heads.
IMPRESSION: 1. Ventral supraumbilical hernia containing a segment of the
transverse colon. No associated obstruction. No significant
inflammatory changes of the herniated segment of the colon.
2. Colonic diverticulosis. Focal inflammatory changes of the
transverse colon just proximal to the neck of the hernia may be
reactive or represent acute diverticulitis. No bowel obstruction.
Normal appendix.
3. Fatty liver.
4. Aortic Atherosclerosis ([id number]).

## 2021-11-22 DIAGNOSIS — R0609 Other forms of dyspnea: Secondary | ICD-10-CM | POA: Diagnosis not present

## 2021-11-22 DIAGNOSIS — J441 Chronic obstructive pulmonary disease with (acute) exacerbation: Secondary | ICD-10-CM | POA: Diagnosis not present

## 2021-12-10 DIAGNOSIS — J449 Chronic obstructive pulmonary disease, unspecified: Secondary | ICD-10-CM | POA: Diagnosis not present

## 2021-12-13 DIAGNOSIS — J449 Chronic obstructive pulmonary disease, unspecified: Secondary | ICD-10-CM | POA: Diagnosis not present

## 2022-01-10 DIAGNOSIS — J449 Chronic obstructive pulmonary disease, unspecified: Secondary | ICD-10-CM | POA: Diagnosis not present

## 2022-01-13 DIAGNOSIS — J449 Chronic obstructive pulmonary disease, unspecified: Secondary | ICD-10-CM | POA: Diagnosis not present

## 2022-02-10 DIAGNOSIS — J449 Chronic obstructive pulmonary disease, unspecified: Secondary | ICD-10-CM | POA: Diagnosis not present

## 2022-02-24 DIAGNOSIS — J441 Chronic obstructive pulmonary disease with (acute) exacerbation: Secondary | ICD-10-CM | POA: Diagnosis not present

## 2022-02-24 DIAGNOSIS — R0609 Other forms of dyspnea: Secondary | ICD-10-CM | POA: Diagnosis not present

## 2022-03-10 DIAGNOSIS — J449 Chronic obstructive pulmonary disease, unspecified: Secondary | ICD-10-CM | POA: Diagnosis not present

## 2022-03-13 DIAGNOSIS — J449 Chronic obstructive pulmonary disease, unspecified: Secondary | ICD-10-CM | POA: Diagnosis not present

## 2022-04-12 DIAGNOSIS — J449 Chronic obstructive pulmonary disease, unspecified: Secondary | ICD-10-CM | POA: Diagnosis not present

## 2022-05-10 DIAGNOSIS — J449 Chronic obstructive pulmonary disease, unspecified: Secondary | ICD-10-CM | POA: Diagnosis not present

## 2022-05-13 DIAGNOSIS — J449 Chronic obstructive pulmonary disease, unspecified: Secondary | ICD-10-CM | POA: Diagnosis not present

## 2022-06-02 ENCOUNTER — Emergency Department
Admission: EM | Admit: 2022-06-02 | Discharge: 2022-06-02 | Disposition: A | Discharge status: Home / Self Care | Payer: Medicare HMO | Attending: Emergency Medicine | Admitting: Emergency Medicine

## 2022-06-02 ENCOUNTER — Other Ambulatory Visit: Payer: Self-pay

## 2022-06-02 ENCOUNTER — Emergency Department: Payer: Medicare HMO

## 2022-06-02 DIAGNOSIS — J029 Acute pharyngitis, unspecified: Secondary | ICD-10-CM | POA: Diagnosis not present

## 2022-06-02 DIAGNOSIS — R0600 Dyspnea, unspecified: Secondary | ICD-10-CM | POA: Diagnosis not present

## 2022-06-02 DIAGNOSIS — R0602 Shortness of breath: Secondary | ICD-10-CM | POA: Diagnosis not present

## 2022-06-02 DIAGNOSIS — R062 Wheezing: Secondary | ICD-10-CM | POA: Insufficient documentation

## 2022-06-02 DIAGNOSIS — J449 Chronic obstructive pulmonary disease, unspecified: Secondary | ICD-10-CM | POA: Insufficient documentation

## 2022-06-02 DIAGNOSIS — Z20822 Contact with and (suspected) exposure to covid-19: Secondary | ICD-10-CM | POA: Insufficient documentation

## 2022-06-02 DIAGNOSIS — R1310 Dysphagia, unspecified: Secondary | ICD-10-CM | POA: Diagnosis not present

## 2022-06-02 DIAGNOSIS — B370 Candidal stomatitis: Secondary | ICD-10-CM

## 2022-06-02 DIAGNOSIS — R0689 Other abnormalities of breathing: Secondary | ICD-10-CM | POA: Diagnosis not present

## 2022-06-02 DIAGNOSIS — I10 Essential (primary) hypertension: Secondary | ICD-10-CM | POA: Diagnosis not present

## 2022-06-02 DIAGNOSIS — J441 Chronic obstructive pulmonary disease with (acute) exacerbation: Secondary | ICD-10-CM | POA: Diagnosis not present

## 2022-06-02 DIAGNOSIS — I959 Hypotension, unspecified: Secondary | ICD-10-CM | POA: Diagnosis not present

## 2022-06-02 LAB — COMPREHENSIVE METABOLIC PANEL
ALT: 38 U/L (ref 0–44)
AST: 20 U/L (ref 15–41)
Albumin: 3.7 g/dL (ref 3.5–5.0)
Alkaline Phosphatase: 70 U/L (ref 38–126)
Anion gap: 8 (ref 5–15)
BUN: 28 mg/dL — ABNORMAL HIGH (ref 6–20)
CO2: 27 mmol/L (ref 22–32)
Calcium: 9.4 mg/dL (ref 8.9–10.3)
Chloride: 103 mmol/L (ref 98–111)
Creatinine, Ser: 1.64 mg/dL — ABNORMAL HIGH (ref 0.44–1.00)
GFR, Estimated: 36 mL/min — ABNORMAL LOW (ref >60)
Glucose, Bld: 186 mg/dL — ABNORMAL HIGH (ref 70–99)
Potassium: 4 mmol/L (ref 3.5–5.1)
Sodium: 138 mmol/L (ref 135–145)
Total Bilirubin: 0.4 mg/dL (ref 0.3–1.2)
Total Protein: 6.9 g/dL (ref 6.5–8.1)

## 2022-06-02 LAB — CBC WITH DIFFERENTIAL/PLATELET
Abs Immature Granulocytes: 0.06 10*3/uL (ref 0.00–0.07)
Basophils Absolute: 0.1 10*3/uL (ref 0.0–0.1)
Basophils Relative: 0 %
Eosinophils Absolute: 0.2 10*3/uL (ref 0.0–0.5)
Eosinophils Relative: 2 %
HCT: 43.3 % (ref 36.0–46.0)
Hemoglobin: 13.7 g/dL (ref 12.0–15.0)
Immature Granulocytes: 1 %
Lymphocytes Relative: 36 %
Lymphs Abs: 4.4 10*3/uL — ABNORMAL HIGH (ref 0.7–4.0)
MCH: 28.4 pg (ref 26.0–34.0)
MCHC: 31.6 g/dL (ref 30.0–36.0)
MCV: 89.8 fL (ref 80.0–100.0)
Monocytes Absolute: 0.6 10*3/uL (ref 0.1–1.0)
Monocytes Relative: 5 %
Neutro Abs: 6.9 10*3/uL (ref 1.7–7.7)
Neutrophils Relative %: 56 %
Platelets: 358 10*3/uL (ref 150–400)
RBC: 4.82 MIL/uL (ref 3.87–5.11)
RDW: 14.4 % (ref 11.5–15.5)
WBC: 12.2 10*3/uL — ABNORMAL HIGH (ref 4.0–10.5)
nRBC: 0 % (ref 0.0–0.2)

## 2022-06-02 LAB — RESP PANEL BY RT-PCR (FLU A&B, COVID) ARPGX2
Influenza A by PCR: NEGATIVE
Influenza B by PCR: NEGATIVE
SARS Coronavirus 2 by RT PCR: NEGATIVE

## 2022-06-02 LAB — GROUP A STREP BY PCR: Group A Strep by PCR: NOT DETECTED

## 2022-06-02 MED ORDER — FLUCONAZOLE 50 MG PO TABS
150.0000 mg | ORAL_TABLET | Freq: Once | ORAL | Status: AC
  Administered 2022-06-02: 150 mg via ORAL
  Filled 2022-06-02: qty 1

## 2022-06-02 MED ORDER — IPRATROPIUM-ALBUTEROL 0.5-2.5 (3) MG/3ML IN SOLN
3.0000 mL | Freq: Once | RESPIRATORY_TRACT | Status: AC
  Administered 2022-06-02: 3 mL via RESPIRATORY_TRACT
  Filled 2022-06-02: qty 3

## 2022-06-02 MED ORDER — MENTHOL 3 MG MT LOZG
1.0000 | LOZENGE | OROMUCOSAL | Status: DC | PRN
  Administered 2022-06-02: 3 mg via ORAL
  Filled 2022-06-02: qty 9

## 2022-06-02 MED ORDER — PREDNISONE 20 MG PO TABS: 60.0000 mg | ORAL_TABLET | Freq: Every day | ORAL | 0 refills | Status: AC

## 2022-06-02 MED ORDER — FLUCONAZOLE 100 MG PO TABS: 100.0000 mg | ORAL_TABLET | Freq: Every day | ORAL | 0 refills | Status: AC

## 2022-06-02 NOTE — ED Provider Notes (Signed)
-----------------------------------------   3:17 PM on 06/02/2022 -----------------------------------------  Blood pressure (!) 162/100, pulse 97, temperature 98.1 F (36.7 C), resp. rate (!) 22, height 5\' 7"  (1.702 m), weight 123.8 kg, SpO2 97 %.  Assuming care from Dr. Laurence Aly.  In short, Samantha [name] is a 56 y.o. female with a chief complaint of wheezing/dyspnve.  Refer to the original H&P for additional details.  The current plan of care is to reassessments after treatments including steroids and nebulizers.  Chest x-ray as well as CBC metabolic panel are pending.    Sharyn Creamer, MD 06/02/22 1517

## 2022-06-02 NOTE — ED Provider Notes (Signed)
Chest x-ray personally interpreted by me as negative for acute finding or infiltrate  CBC shows very mild leukocytosis.  Labs notable for mild chronic renal insufficiency with creatinine 1.6 similar to previous baseline from a few months ago.  ----------------------------------------- 4:28 PM on 06/02/2022 ----------------------------------------- Patient examined, reports she feels notably better and her breathing feels much better.  She has also reported that she her throats been sore and she feels a bit hoarse for the last day.  No trouble swallowing, not spitting up and she is handling secretions without difficulty.  Examination the oropharynx shows a slightly grayish plaque covering the tongue and also mild bilateral tonsillar hypertrophy without obvious exudates.  Mild anterior cervical tenderness along the lymph chain  We will exclude strep, pretest probability is low, send strep test.  No obvious purulence or evidence of unilateral tonsillar swelling or evidence of tonsillar abscess or anterior neck abscess, retropharyngeal abscess etc. by clinical exam.  Does appear that she likely has thrush.  As she is responding well to treatment at this time including improvement in breathing respiratory rate normalized oxygen saturation 92% on room air, no ongoing wheezing reports she feels much better and lung sounds are now clear bilaterally speaking in full clear sentences without distress except for some hoarseness or possible laryngitis   Sharyn Creamer, MD 06/02/22 1630

## 2022-06-02 NOTE — ED Notes (Signed)
D/C and new RX and reasons to return discussed with pt, pt verbalized understanding. Pt ambulatory with steady gait on D/C.  Pt refused D/C vitals due to "needing to go now".

## 2022-06-02 NOTE — ED Notes (Signed)
This pt stated her throat was sore at this time, this rn asked the MD for medication

## 2022-06-02 NOTE — ED Provider Notes (Signed)
   [facility] Provider Note    Event Date/Time   First MD Initiated Contact with Patient 06/02/22 1455     (approximate)   History   No chief complaint on file.   HPI  Adamari A Sollars is a 56 y.o. female with a past history of COPD who comes in complaining of shortness of breath.  EMS reports she was wheezing a lot but sats were 95 on her 2 L. EMS gave her an albuterol neb a DuoNeb 2 g of magnesium 125 Solu-Medrol.     Physical Exam   Triage Vital Signs: ED Triage Vitals  Enc Vitals Group     BP      Pulse      Resp      Temp      Temp src      SpO2      Weight      Height      Head Circumference      Peak Flow      Pain Score      Pain Loc      Pain Edu?      Excl. in [place]?     Most recent vital signs: Vitals:   06/02/22 1454  Pulse: 97  Resp: (!) 22     General: Awake, no distress.  CV:  Good peripheral perfusion.  Heart regular rate and rhythm no audible murmurs Resp:  Normal effort.  Lungs are now clear Abd:  No distention.  Soft and nontender Extremities slight trace edema   ED Results / Procedures / Treatments   Labs (all labs ordered are listed, but only abnormal results are displayed) Labs Reviewed  COMPREHENSIVE METABOLIC PANEL  CBC WITH DIFFERENTIAL/PLATELET     EKG  Pending at this time   RADIOLOGY Pending at this time   PROCEDURES:  Critical Care performed:   Procedures   MEDICATIONS ORDERED IN ED: Medications - No data to display   IMPRESSION / MDM / ASSESSMENT AND PLAN / ED COURSE  I reviewed the triage vital signs and the nursing notes. I will sign this patient out to the oncoming physician Dr. Fanny Bien.  We will check some lab work chest x-ray and possibly give her another DuoNeb.  Differential diagnosis includes, but is not limited to, asthma, COPD, bronchitis, pneumonia, less likely are PE and pneumothorax  Patient's presentation is most consistent with exacerbation of chronic  illness.  The patient is on the cardiac monitor to evaluate for evidence of arrhythmia and/or significant heart rate changes.  None have been seen so far     FINAL CLINICAL IMPRESSION(S) / ED DIAGNOSES   Final diagnoses:  Dyspnea, unspecified type     Rx / DC Orders   ED Discharge Orders     None        Note:  This document was prepared using Dragon voice recognition software and may include unintentional dictation errors.   Arnaldo Natal, MD 06/02/22 1459

## 2022-06-02 NOTE — ED Triage Notes (Signed)
Pt presents to ED with c/o of SOB, pt states HX of asthma. Pt states this been increasingly getting worse over the past 2 days. Pt states normally wears 2L/min via [place].   EMS states wheezing throughout all lobes.   EMS gave 125 mg of solumedrol 1 duoneb 1 albuterol  2gm of mag given   Lung clear at this time after TX.

## 2022-06-12 DIAGNOSIS — J449 Chronic obstructive pulmonary disease, unspecified: Secondary | ICD-10-CM | POA: Diagnosis not present

## 2022-06-13 ENCOUNTER — Other Ambulatory Visit: Payer: Self-pay

## 2022-06-13 ENCOUNTER — Emergency Department: Payer: Medicare HMO

## 2022-06-13 ENCOUNTER — Emergency Department
Admission: EM | Admit: 2022-06-13 | Discharge: 2022-06-13 | Disposition: A | Discharge status: Home / Self Care | Payer: Medicare HMO | Attending: Emergency Medicine | Admitting: Emergency Medicine

## 2022-06-13 DIAGNOSIS — E119 Type 2 diabetes mellitus without complications: Secondary | ICD-10-CM | POA: Insufficient documentation

## 2022-06-13 DIAGNOSIS — J441 Chronic obstructive pulmonary disease with (acute) exacerbation: Secondary | ICD-10-CM

## 2022-06-13 DIAGNOSIS — I10 Essential (primary) hypertension: Secondary | ICD-10-CM | POA: Insufficient documentation

## 2022-06-13 DIAGNOSIS — R0602 Shortness of breath: Secondary | ICD-10-CM | POA: Diagnosis not present

## 2022-06-13 DIAGNOSIS — J45909 Unspecified asthma, uncomplicated: Secondary | ICD-10-CM | POA: Insufficient documentation

## 2022-06-13 DIAGNOSIS — R069 Unspecified abnormalities of breathing: Secondary | ICD-10-CM | POA: Diagnosis not present

## 2022-06-13 LAB — CBC
HCT: 46 % (ref 36.0–46.0)
Hemoglobin: 14.5 g/dL (ref 12.0–15.0)
MCH: 28.5 pg (ref 26.0–34.0)
MCHC: 31.5 g/dL (ref 30.0–36.0)
MCV: 90.4 fL (ref 80.0–100.0)
Platelets: 345 10*3/uL (ref 150–400)
RBC: 5.09 MIL/uL (ref 3.87–5.11)
RDW: 13.9 % (ref 11.5–15.5)
WBC: 10.2 10*3/uL (ref 4.0–10.5)
nRBC: 0 % (ref 0.0–0.2)

## 2022-06-13 LAB — BASIC METABOLIC PANEL
Anion gap: 9 (ref 5–15)
BUN: 32 mg/dL — ABNORMAL HIGH (ref 6–20)
CO2: 25 mmol/L (ref 22–32)
Calcium: 9.3 mg/dL (ref 8.9–10.3)
Chloride: 104 mmol/L (ref 98–111)
Creatinine, Ser: 1.72 mg/dL — ABNORMAL HIGH (ref 0.44–1.00)
GFR, Estimated: 34 mL/min — ABNORMAL LOW (ref >60)
Glucose, Bld: 178 mg/dL — ABNORMAL HIGH (ref 70–99)
Potassium: 5.1 mmol/L (ref 3.5–5.1)
Sodium: 138 mmol/L (ref 135–145)

## 2022-06-13 MED ORDER — PREDNISONE 10 MG PO TABS: 10.0000 mg | ORAL_TABLET | Freq: Every day | ORAL | 0 refills | Status: DC

## 2022-06-13 MED ORDER — METHYLPREDNISOLONE SODIUM SUCC 125 MG IJ SOLR: 125.0000 mg | Freq: Once | INTRAMUSCULAR | Status: DC

## 2022-06-13 MED ORDER — DEXAMETHASONE SODIUM PHOSPHATE 10 MG/ML IJ SOLN
10.0000 mg | Freq: Once | INTRAMUSCULAR | Status: AC
  Administered 2022-06-13: 10 mg via INTRAMUSCULAR
  Filled 2022-06-13: qty 1

## 2022-06-13 MED ORDER — IPRATROPIUM-ALBUTEROL 0.5-2.5 (3) MG/3ML IN SOLN
3.0000 mL | Freq: Once | RESPIRATORY_TRACT | Status: AC
  Administered 2022-06-13: 3 mL via RESPIRATORY_TRACT
  Filled 2022-06-13: qty 6

## 2022-06-13 MED ORDER — IPRATROPIUM-ALBUTEROL 0.5-2.5 (3) MG/3ML IN SOLN
3.0000 mL | Freq: Once | RESPIRATORY_TRACT | Status: AC
  Administered 2022-06-13: 3 mL via RESPIRATORY_TRACT

## 2022-06-13 NOTE — ED Provider Triage Note (Signed)
Emergency Medicine Provider Triage Evaluation Note  Kaleen Odea , a 56 y.o. female with a PMH of COPD was evaluated in triage.  Pt complains of SOB x2 days. Denies CP. On 2L [place]. Had a recent URI last week. No fever. Cough with yellow phlegm. No history of PE/DVT.  Marland Kitchen  Review of Systems  Positive: SOB/wheezing Negative: CP, leg swelling  Physical Exam  There were no vitals taken for this visit. Gen:   Awake, no distress   Resp:  Normal effort, audible wheezing MSK:   Moves extremities without difficulty  Other:    Medical Decision Making  Medically screening exam initiated at 7:01 PM.  Appropriate orders placed.  Chayce A Tanimoto was informed that the remainder of the evaluation will be completed by another provider, this initial triage assessment does not replace that evaluation, and the importance of remaining in the ED until their evaluation is complete.     Jackelyn Hoehn, PA-C 06/13/22 1906

## 2022-06-13 NOTE — ED Provider Notes (Signed)
[facility] Provider Note    Event Date/Time   First MD Initiated Contact with Patient 06/13/22 2208     (approximate)  History   Chief Complaint: Shortness of Breath  HPI  Samantha [name] is a 56 y.o. female with a past medical history of COPD, asthma, diabetes, hypertension, presents to the emergency department for shortness of breath.  Patient states she has a history of COPD.  States she was just in the hospital last week for COPD but prednisone ran out approximately 1 week ago.  Patient states since that time she has had worsening shortness of breath.  Denies any cough but states shortness of breath and wheeze.  No fever.  Physical Exam   Triage Vital Signs: ED Triage Vitals [06/13/22 1902]  Enc Vitals Group     BP (!) 152/106     Pulse Rate (!) 104     Resp (!) 22     Temp 98.4 F (36.9 C)     Temp Source Oral     SpO2 97 %     Weight      Height      Head Circumference      Peak Flow      Pain Score      Pain Loc      Pain Edu?      Excl. in GC?     Most recent vital signs: Vitals:   06/13/22 1902  BP: (!) 152/106  Pulse: (!) 104  Resp: (!) 22  Temp: 98.4 F (36.9 C)  SpO2: 97%    General: Awake, no distress.  CV:  Good peripheral perfusion.  Regular rate and rhythm  Resp:  Mild tachypnea.  Moderate expiratory wheeze bilaterally. Abd:  No distention.  Soft, nontender.  No rebound or guarding.    ED Results / Procedures / Treatments   EKG  EKG viewed and interpreted by myself shows sinus tachycardia at 103 bpm with a narrow QRS, normal axis, normal intervals, no concerning ST changes.  RADIOLOGY  I have viewed and interpreted the chest x-ray images I do not see any acute abnormality seen on my evaluation. Radiology is read the chest x-ray is negative   MEDICATIONS ORDERED IN ED: Medications  ipratropium-albuterol (DUONEB) 0.5-2.5 (3) MG/3ML nebulizer solution 3 mL (has no administration in time range)   ipratropium-albuterol (DUONEB) 0.5-2.5 (3) MG/3ML nebulizer solution 3 mL (has no administration in time range)  dexamethasone (DECADRON) injection 10 mg (has no administration in time range)     IMPRESSION / MDM / ASSESSMENT AND PLAN / ED COURSE  I reviewed the triage vital signs and the nursing notes.  Patient's presentation is most consistent with acute presentation with potential threat to life or bodily function.  Patient presents emergency department for trouble breathing.  Patient does have expiratory wheezing bilaterally.  She is reassuringly satting 97 to 99% on room air.  Patient wears 2 L of oxygen as needed at home for her COPD.   Patient's work-up in the emergency department is reassuring.  Unchanged chemistry, normal CBC, reassuring EKG and a clear chest x-ray.   Patient given a shot of Decadron in the emergency department as well as multiple DuoNebs.  Patient states she is feeling much better and wishes to go home.  We will discharge the patient home on a prolonged prednisone taper.  FINAL CLINICAL IMPRESSION(S) / ED DIAGNOSES   COPD exacerbation  Note:  This document was prepared using Dragon voice recognition  software and may include unintentional dictation errors.   Minna Antis, MD 06/13/22 2257

## 2022-06-13 NOTE — ED Notes (Signed)
Pt states she feels short of breath. Breathing is equal and unlabored.

## 2022-06-13 NOTE — ED Notes (Signed)
Pt c/o SHOB and requests O2; sat 95% on ra; O2 applied at 2l/min via [place] for pt comfort

## 2022-06-13 NOTE — ED Triage Notes (Signed)
Pt comes via EMs from home with c/o sob. Pt states this started two days ago. Pt states hx of asthma and COPD. Pt has hx of intubation twice. BP-157/99 HR-109 O2-96 % RA  Per EMs pt did refuse any treatments. Pt showing labored breathing and accessory muscle use noted. Pt recently seen here for same.

## 2022-06-13 NOTE — ED Notes (Signed)
Pt called daughter for ride and gave verbal consent to dc

## 2022-07-10 DIAGNOSIS — J449 Chronic obstructive pulmonary disease, unspecified: Secondary | ICD-10-CM | POA: Diagnosis not present

## 2022-07-13 DIAGNOSIS — J449 Chronic obstructive pulmonary disease, unspecified: Secondary | ICD-10-CM | POA: Diagnosis not present

## 2022-08-10 DIAGNOSIS — J449 Chronic obstructive pulmonary disease, unspecified: Secondary | ICD-10-CM | POA: Diagnosis not present

## 2022-08-13 DIAGNOSIS — J449 Chronic obstructive pulmonary disease, unspecified: Secondary | ICD-10-CM | POA: Diagnosis not present

## 2022-08-24 ENCOUNTER — Telehealth: Payer: Self-pay | Admitting: *Deleted

## 2022-08-24 NOTE — Patient Outreach (Signed)
  Care Coordination   08/24/2022 Name: Samantha [name] MRN: [id number] DOB: 1966/12/04   Care Coordination Outreach Attempts:  An unsuccessful telephone outreach was attempted today to offer the patient information about available care coordination services as a benefit of their health plan.   Follow Up Plan:  Additional outreach attempts will be made to offer the patient care coordination information and services.   Encounter Outcome:  No Answer  Care Coordination Interventions Activated:  No   Care Coordination Interventions:  No, not indicated    Irving Shows [facility], BSN [facility] RN Care Coordinator [phone]

## 2022-08-25 ENCOUNTER — Telehealth: Payer: Self-pay | Admitting: *Deleted

## 2022-08-25 NOTE — Patient Outreach (Signed)
  Care Coordination   08/25/2022 Name: Samantha [name] MRN: [id number] DOB: 03-Jul-1966   Care Coordination Outreach Attempts:  A second unsuccessful outreach was attempted today to offer the patient with information about available care coordination services as a benefit of their health plan.     Follow Up Plan:  No further outreach attempts will be made at this time. We have been unable to contact the patient to offer or enroll patient in care coordination services  Encounter Outcome:  No Answer  Care Coordination Interventions Activated:  No   Care Coordination Interventions:  No, not indicated    Irving Shows [facility], BSN [facility] RN Care Coordinator [phone]

## 2022-08-26 ENCOUNTER — Telehealth: Payer: Self-pay | Admitting: *Deleted

## 2022-08-26 NOTE — Patient Outreach (Signed)
  Care Coordination   08/26/2022 Name: Samantha [name] MRN: [id number] DOB: 18-Dec-1965   Care Coordination Outreach Attempts:  A third unsuccessful outreach was attempted today to offer the patient with information about available care coordination services as a benefit of their health plan.   Follow Up Plan:  No further outreach attempts will be made at this time. We have been unable to contact the patient to offer or enroll patient in care coordination services  Encounter Outcome:  No Answer  Care Coordination Interventions Activated:  No   Care Coordination Interventions:  No, not indicated    Irving Shows [facility], BSN [facility] RN Care Coordinator [phone]

## 2022-09-21 DIAGNOSIS — J9621 Acute and chronic respiratory failure with hypoxia: Secondary | ICD-10-CM | POA: Diagnosis not present

## 2022-12-27 DIAGNOSIS — F17200 Nicotine dependence, unspecified, uncomplicated: Secondary | ICD-10-CM | POA: Diagnosis not present

## 2022-12-27 DIAGNOSIS — Z6841 Body Mass Index (BMI) 40.0 and over, adult: Secondary | ICD-10-CM | POA: Diagnosis not present

## 2022-12-27 DIAGNOSIS — J42 Unspecified chronic bronchitis: Secondary | ICD-10-CM | POA: Diagnosis not present

## 2022-12-27 DIAGNOSIS — K439 Ventral hernia without obstruction or gangrene: Secondary | ICD-10-CM | POA: Diagnosis not present

## 2023-01-04 DIAGNOSIS — G4733 Obstructive sleep apnea (adult) (pediatric): Secondary | ICD-10-CM | POA: Diagnosis not present

## 2023-01-04 DIAGNOSIS — J439 Emphysema, unspecified: Secondary | ICD-10-CM | POA: Diagnosis not present

## 2023-01-04 DIAGNOSIS — F17218 Nicotine dependence, cigarettes, with other nicotine-induced disorders: Secondary | ICD-10-CM | POA: Diagnosis not present

## 2023-01-04 DIAGNOSIS — K439 Ventral hernia without obstruction or gangrene: Secondary | ICD-10-CM | POA: Diagnosis not present

## 2023-01-04 DIAGNOSIS — M25571 Pain in right ankle and joints of right foot: Secondary | ICD-10-CM | POA: Diagnosis not present

## 2023-03-18 IMAGING — DX DG CHEST 1V
1 series · 1 of 1 positions shown · non-contrast
Comparison: 07/23/2019

CLINICAL DATA: Shortness of breath and wheezing

EXAM:
CHEST  1 VIEW

[chest ap]
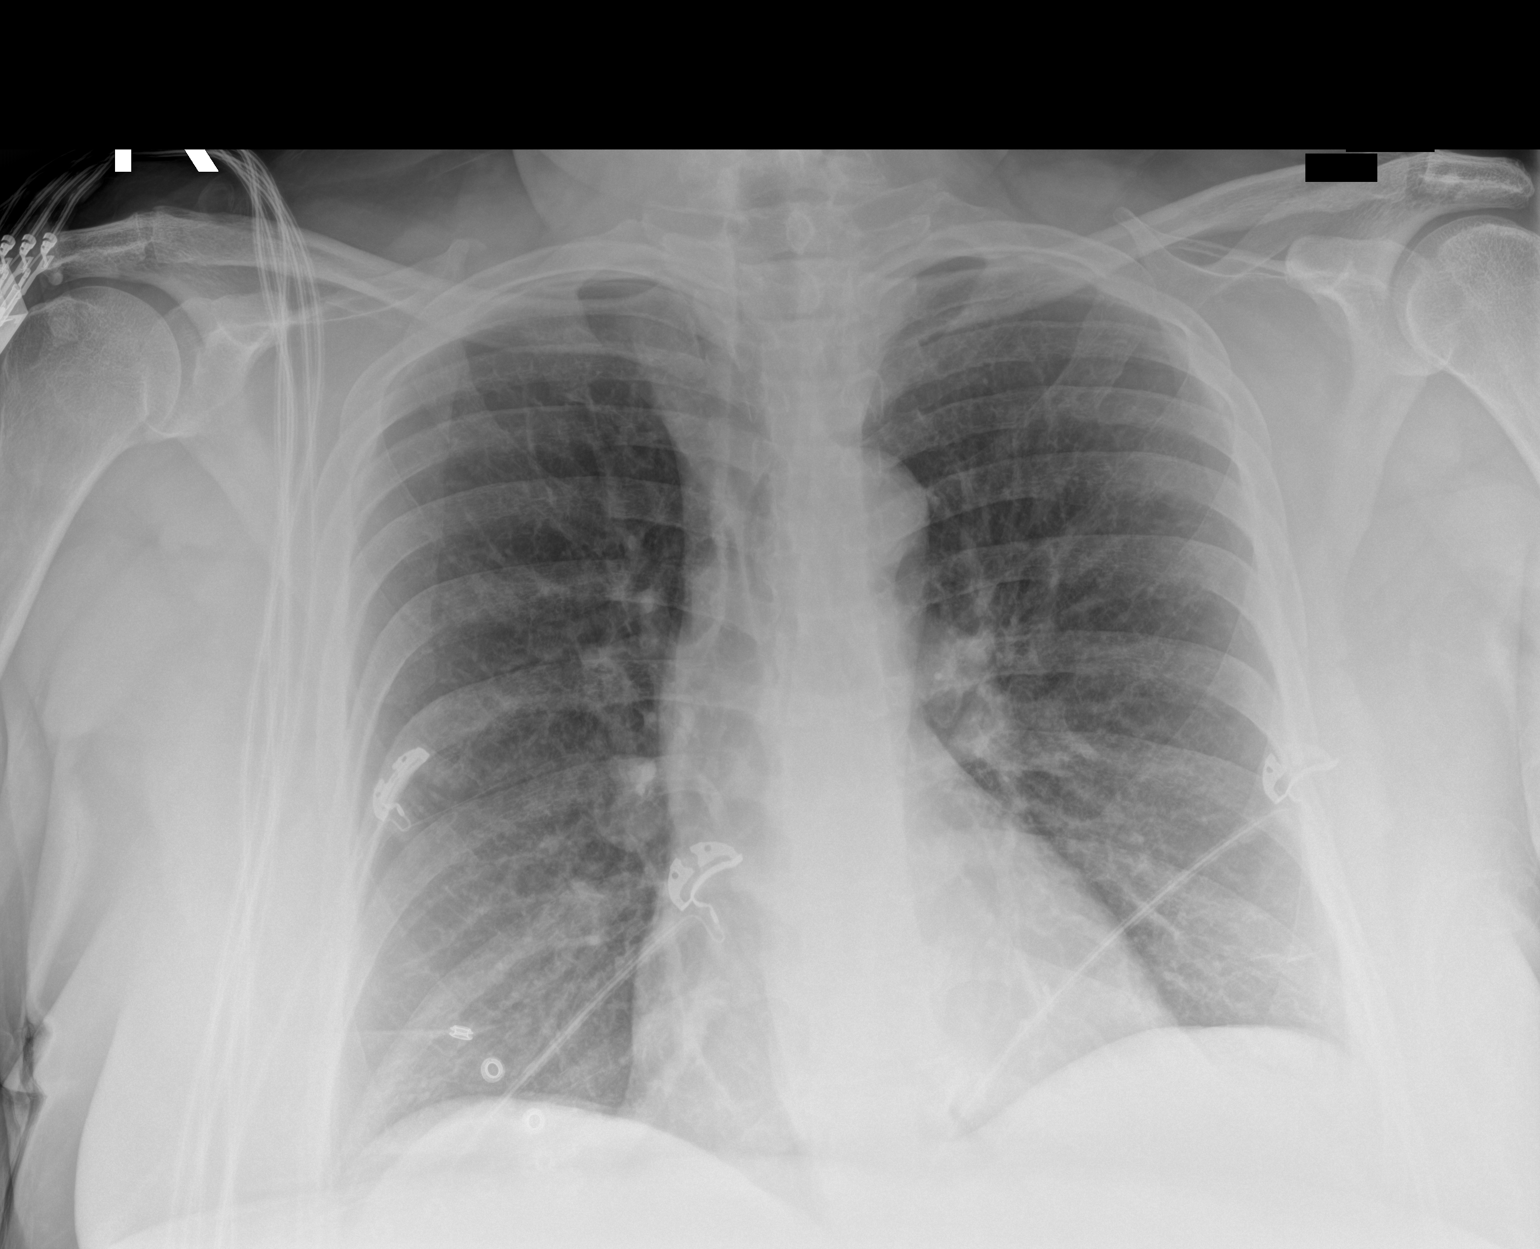

[1 of 1 positions shown; findings below may reference images not displayed]

FINDINGS: The heart size and mediastinal contours are within normal limits.
Both lungs are clear. The visualized skeletal structures are
unremarkable.
IMPRESSION: No active disease.

## 2023-05-12 DIAGNOSIS — E119 Type 2 diabetes mellitus without complications: Secondary | ICD-10-CM | POA: Diagnosis not present

## 2023-05-12 DIAGNOSIS — I10 Essential (primary) hypertension: Secondary | ICD-10-CM | POA: Diagnosis not present

## 2023-05-12 DIAGNOSIS — J449 Chronic obstructive pulmonary disease, unspecified: Secondary | ICD-10-CM | POA: Diagnosis not present

## 2023-05-12 DIAGNOSIS — H00019 Hordeolum externum unspecified eye, unspecified eyelid: Secondary | ICD-10-CM | POA: Diagnosis not present

## 2023-05-25 ENCOUNTER — Other Ambulatory Visit: Payer: Self-pay

## 2023-05-25 ENCOUNTER — Emergency Department: Payer: Medicare HMO

## 2023-05-25 ENCOUNTER — Emergency Department
Admission: EM | Admit: 2023-05-25 | Discharge: 2023-05-25 | Disposition: A | Discharge status: Home / Self Care | Payer: Medicare HMO | Attending: Emergency Medicine | Admitting: Emergency Medicine

## 2023-05-25 ENCOUNTER — Encounter: Payer: Self-pay | Admitting: Emergency Medicine

## 2023-05-25 DIAGNOSIS — M7989 Other specified soft tissue disorders: Secondary | ICD-10-CM | POA: Insufficient documentation

## 2023-05-25 DIAGNOSIS — E119 Type 2 diabetes mellitus without complications: Secondary | ICD-10-CM | POA: Diagnosis not present

## 2023-05-25 DIAGNOSIS — R7981 Abnormal blood-gas level: Secondary | ICD-10-CM | POA: Insufficient documentation

## 2023-05-25 DIAGNOSIS — I10 Essential (primary) hypertension: Secondary | ICD-10-CM | POA: Diagnosis not present

## 2023-05-25 DIAGNOSIS — J441 Chronic obstructive pulmonary disease with (acute) exacerbation: Secondary | ICD-10-CM | POA: Insufficient documentation

## 2023-05-25 DIAGNOSIS — R0602 Shortness of breath: Secondary | ICD-10-CM | POA: Diagnosis not present

## 2023-05-25 DIAGNOSIS — M25571 Pain in right ankle and joints of right foot: Secondary | ICD-10-CM | POA: Insufficient documentation

## 2023-05-25 DIAGNOSIS — R0902 Hypoxemia: Secondary | ICD-10-CM | POA: Diagnosis not present

## 2023-05-25 DIAGNOSIS — J45909 Unspecified asthma, uncomplicated: Secondary | ICD-10-CM | POA: Diagnosis not present

## 2023-05-25 LAB — COMPREHENSIVE METABOLIC PANEL
ALT: 26 U/L (ref 0–44)
AST: 21 U/L (ref 15–41)
Albumin: 3.8 g/dL (ref 3.5–5.0)
Alkaline Phosphatase: 68 U/L (ref 38–126)
Anion gap: 11 (ref 5–15)
BUN: 35 mg/dL — ABNORMAL HIGH (ref 6–20)
CO2: 18 mmol/L — ABNORMAL LOW (ref 22–32)
Calcium: 9.1 mg/dL (ref 8.9–10.3)
Chloride: 110 mmol/L (ref 98–111)
Creatinine, Ser: 1.47 mg/dL — ABNORMAL HIGH (ref 0.44–1.00)
GFR, Estimated: 41 mL/min — ABNORMAL LOW (ref >60)
Glucose, Bld: 95 mg/dL (ref 70–99)
Potassium: 4.6 mmol/L (ref 3.5–5.1)
Sodium: 139 mmol/L (ref 135–145)
Total Bilirubin: 0.3 mg/dL (ref 0.3–1.2)
Total Protein: 7 g/dL (ref 6.5–8.1)

## 2023-05-25 LAB — CBC
HCT: 41.6 % (ref 36.0–46.0)
Hemoglobin: 13.1 g/dL (ref 12.0–15.0)
MCH: 28.6 pg (ref 26.0–34.0)
MCHC: 31.5 g/dL (ref 30.0–36.0)
MCV: 90.8 fL (ref 80.0–100.0)
Platelets: 384 10*3/uL (ref 150–400)
RBC: 4.58 MIL/uL (ref 3.87–5.11)
RDW: 14.8 % (ref 11.5–15.5)
WBC: 7.7 10*3/uL (ref 4.0–10.5)
nRBC: 0 % (ref 0.0–0.2)

## 2023-05-25 LAB — BRAIN NATRIURETIC PEPTIDE: B Natriuretic Peptide: 35 pg/mL (ref 0.0–100.0)

## 2023-05-25 LAB — TROPONIN I (HIGH SENSITIVITY): Troponin I (High Sensitivity): 4 ng/L (ref <18)

## 2023-05-25 MED ORDER — DEXAMETHASONE SODIUM PHOSPHATE 10 MG/ML IJ SOLN
16.0000 mg | Freq: Once | INTRAMUSCULAR | Status: AC
  Administered 2023-05-25: 16 mg via INTRAMUSCULAR
  Filled 2023-05-25: qty 2

## 2023-05-25 MED ORDER — DOXYCYCLINE MONOHYDRATE 100 MG PO TABS: 100.0000 mg | ORAL_TABLET | Freq: Two times a day (BID) | ORAL | 0 refills | Status: AC

## 2023-05-25 MED ORDER — PREDNISONE 50 MG PO TABS: 50.0000 mg | ORAL_TABLET | Freq: Every day | ORAL | 0 refills | Status: AC

## 2023-05-25 MED ORDER — IPRATROPIUM-ALBUTEROL 0.5-2.5 (3) MG/3ML IN SOLN
9.0000 mL | Freq: Once | RESPIRATORY_TRACT | Status: AC
  Administered 2023-05-25: 9 mL via RESPIRATORY_TRACT
  Filled 2023-05-25: qty 9

## 2023-05-25 NOTE — ED Provider Notes (Signed)
[facility] Provider Note    Event Date/Time   First MD Initiated Contact with Patient 05/25/23 [id number]     (approximate)   History   Shortness of Breath   HPI  Samantha [name] is a 57 y.o. female past medical history send For COPD, asthma, baseline 5 L of oxygen during the daytime), diabetes, hypertension, obesity, who presents to the emergency department for cough and shortness of breath.  3 days of progressively worsening cough and shortness of breath.  Tried albuterol inhalers at home without significant improvement.  Followed by pulmonology.  No recent hospitalizations.  Denies any fever.  No history of PEs or DVTs.  Denies any significant lower extremity edema.  Complaining of some mild pain to her right ankle which has been ongoing.  Does state that she had a prior x-ray and was told to do Tylenol.     Physical Exam   Triage Vital Signs: ED Triage Vitals  Enc Vitals Group     BP 05/25/23 0844 127/88     Pulse Rate 05/25/23 0845 91     Resp 05/25/23 0844 (!) 22     Temp 05/25/23 0844 98.3 F (36.8 C)     Temp Source 05/25/23 0844 Oral     SpO2 05/25/23 0844 94 %     Weight 05/25/23 0845 272 lb 14.9 oz (123.8 kg)     Height 05/25/23 0845 5\' 7"  (1.702 m)     Head Circumference --      Peak Flow --      Pain Score 05/25/23 0845 9     Pain Loc --      Pain Edu? --      Excl. in GC? --     Most recent vital signs: Vitals:   05/25/23 0844 05/25/23 0845  BP: 127/88   Pulse:  91  Resp: (!) 22   Temp: 98.3 F (36.8 C)   SpO2: 94%     Physical Exam Constitutional:      Appearance: She is well-developed.  HENT:     Head: Atraumatic.  Eyes:     Conjunctiva/sclera: Conjunctivae normal.  Cardiovascular:     Rate and Rhythm: Regular rhythm.  Pulmonary:     Effort: Respiratory distress present.     Breath sounds: Wheezing present.     Comments: 5 L home oxygen.  Speaking in full sentences.  Diffuse inspiratory and expiratory wheezing  throughout all lung fields.  No focal rhonchi or rales. Abdominal:     General: There is no distension.  Musculoskeletal:        General: Normal range of motion.     Cervical back: Normal range of motion.     Right lower leg: No edema.     Left lower leg: No edema.     Comments: Mild area of swelling to the right foot.  No significant tenderness to palpation.  No open wounds.  Skin:    General: Skin is warm.  Neurological:     Mental Status: She is alert. Mental status is at baseline.     IMPRESSION / MDM / ASSESSMENT AND PLAN / ED COURSE  I reviewed the triage vital signs and the nursing notes.  Differential diagnosis including COPD exacerbation, ACS, pneumonia, anemia, CHF  EKG  I, Corena Herter, the attending physician, personally viewed and interpreted this ECG.   Rate: 92  Rhythm: Normal sinus  Axis: Normal  Intervals: Normal  ST&T Change: None No significant change  when compared to prior EKG  No tachycardic or bradycardic dysrhythmias while on cardiac telemetry.  RADIOLOGY I independently reviewed imaging, my interpretation of imaging: Chest x-ray with questionable area of interstitial edema.  No focal findings consistent with pneumonia.  Read as mild bilateral lower lung interstitial thickening concerning for possible mild interstitial pulmonary edema.  LABS (all labs ordered are listed, but only abnormal results are displayed) Labs interpreted as -    Labs Reviewed  COMPREHENSIVE METABOLIC PANEL - Abnormal; Notable for the following components:      Result Value   CO2 18 (*)    BUN 35 (*)    Creatinine, Ser 1.47 (*)    GFR, Estimated 41 (*)    All other components within normal limits  CBC  BRAIN NATRIURETIC PEPTIDE  TROPONIN I (HIGH SENSITIVITY)  TROPONIN I (HIGH SENSITIVITY)     MDM  On chart review patient has had a history of COPD exacerbations, no recent hospitalizations, was last treated with DuoNeb treatments and Decadron with prednisone  taper.  Followed by pulmonology.  Patient was given DuoNeb treatments, IM Decadron  On reevaluation significant improvement of her shortness of breath.  Speaking in full sentences and states that she feels much better.  Normal BNP and no signs of significant heart failure.  Troponin is negative.  Creatinine at baseline.  No significant electrolyte abnormalities.  Mildly low CO2.  Will start the patient on steroids and doxycycline.  Instructed to follow-up closely with her primary care physician and pulmonologist.  Given return precautions for worsening shortness of breath.     PROCEDURES:  Critical Care performed: No  Procedures  Patient's presentation is most consistent with acute presentation with potential threat to life or bodily function.   MEDICATIONS ORDERED IN ED: Medications  ipratropium-albuterol (DUONEB) 0.5-2.5 (3) MG/3ML nebulizer solution 9 mL (9 mLs Nebulization Given 05/25/23 1020)  dexamethasone (DECADRON) injection 16 mg (16 mg Intramuscular Given 05/25/23 1020)    FINAL CLINICAL IMPRESSION(S) / ED DIAGNOSES   Final diagnoses:  COPD exacerbation (HCC)     Rx / DC Orders   ED Discharge Orders          Ordered    doxycycline (ADOXA) 100 MG tablet  2 times daily        05/25/23 1024    predniSONE (DELTASONE) 50 MG tablet  Daily with breakfast        05/25/23 1024             Note:  This document was prepared using Dragon voice recognition software and may include unintentional dictation errors.   Corena Herter, MD 05/25/23 1135

## 2023-05-25 NOTE — Discharge Instructions (Signed)
You are seen in the emergency department for trouble with breathing and diagnosed with a COPD/asthma exacerbation.  You are given a prescription for steroids and an antibiotic.  Use your albuterol inhaler every 4 hours as needed for wheezing and shortness of breath.  Follow-up closely with your pulmonologist and your primary care provider.  Prednisone -you are given a prescription for a steroid.  It is important that you take this medication with food.  This medication can cause an upset stomach.  It also can increase your glucose if you have a history of diabetes, so it is important that you check your glucose frequently while you are on this medication.  Doxycycline - This medication can cause acid reflux.  It is important that you take it with food and drink plenty of water.  Do not lie down for 1 hour after taking this medication.  It also causes sun sensitivity so stay out of the sun or wear SPF while on this medication.

## 2023-05-25 NOTE — ED Triage Notes (Signed)
Pt here with SOB via [facility] x3 days. Pt states she has been coughing a lot but denies fever or cp. Pt states she has been using her inhaler and nebulizer with no relief. Pt wears 4L ox oxygen.

## 2023-05-30 ENCOUNTER — Telehealth: Payer: Self-pay | Admitting: *Deleted

## 2023-05-30 NOTE — Telephone Encounter (Signed)
Transition Care Management Unsuccessful Follow-up Telephone Call  Date of discharge and from where:  [facility] 05/25/2023  Attempts:  1st Attempt  Reason for unsuccessful TCM follow-up call:    Doing ok and patient busy declined answering any questions

## 2023-06-08 DIAGNOSIS — F17218 Nicotine dependence, cigarettes, with other nicotine-induced disorders: Secondary | ICD-10-CM | POA: Diagnosis not present

## 2023-06-08 DIAGNOSIS — G4733 Obstructive sleep apnea (adult) (pediatric): Secondary | ICD-10-CM | POA: Diagnosis not present

## 2023-06-08 DIAGNOSIS — J441 Chronic obstructive pulmonary disease with (acute) exacerbation: Secondary | ICD-10-CM | POA: Diagnosis not present

## 2023-06-08 DIAGNOSIS — R0602 Shortness of breath: Secondary | ICD-10-CM | POA: Diagnosis not present

## 2023-10-02 IMAGING — DX DG CHEST 1V PORT
1 series · 1 of 1 positions shown · non-contrast
Comparison: Prior chest radiographs 11/16/2021 and earlier.

CLINICAL DATA: Provided history: Shortness of breath.

EXAM:
PORTABLE CHEST 1 VIEW

[chest ap]
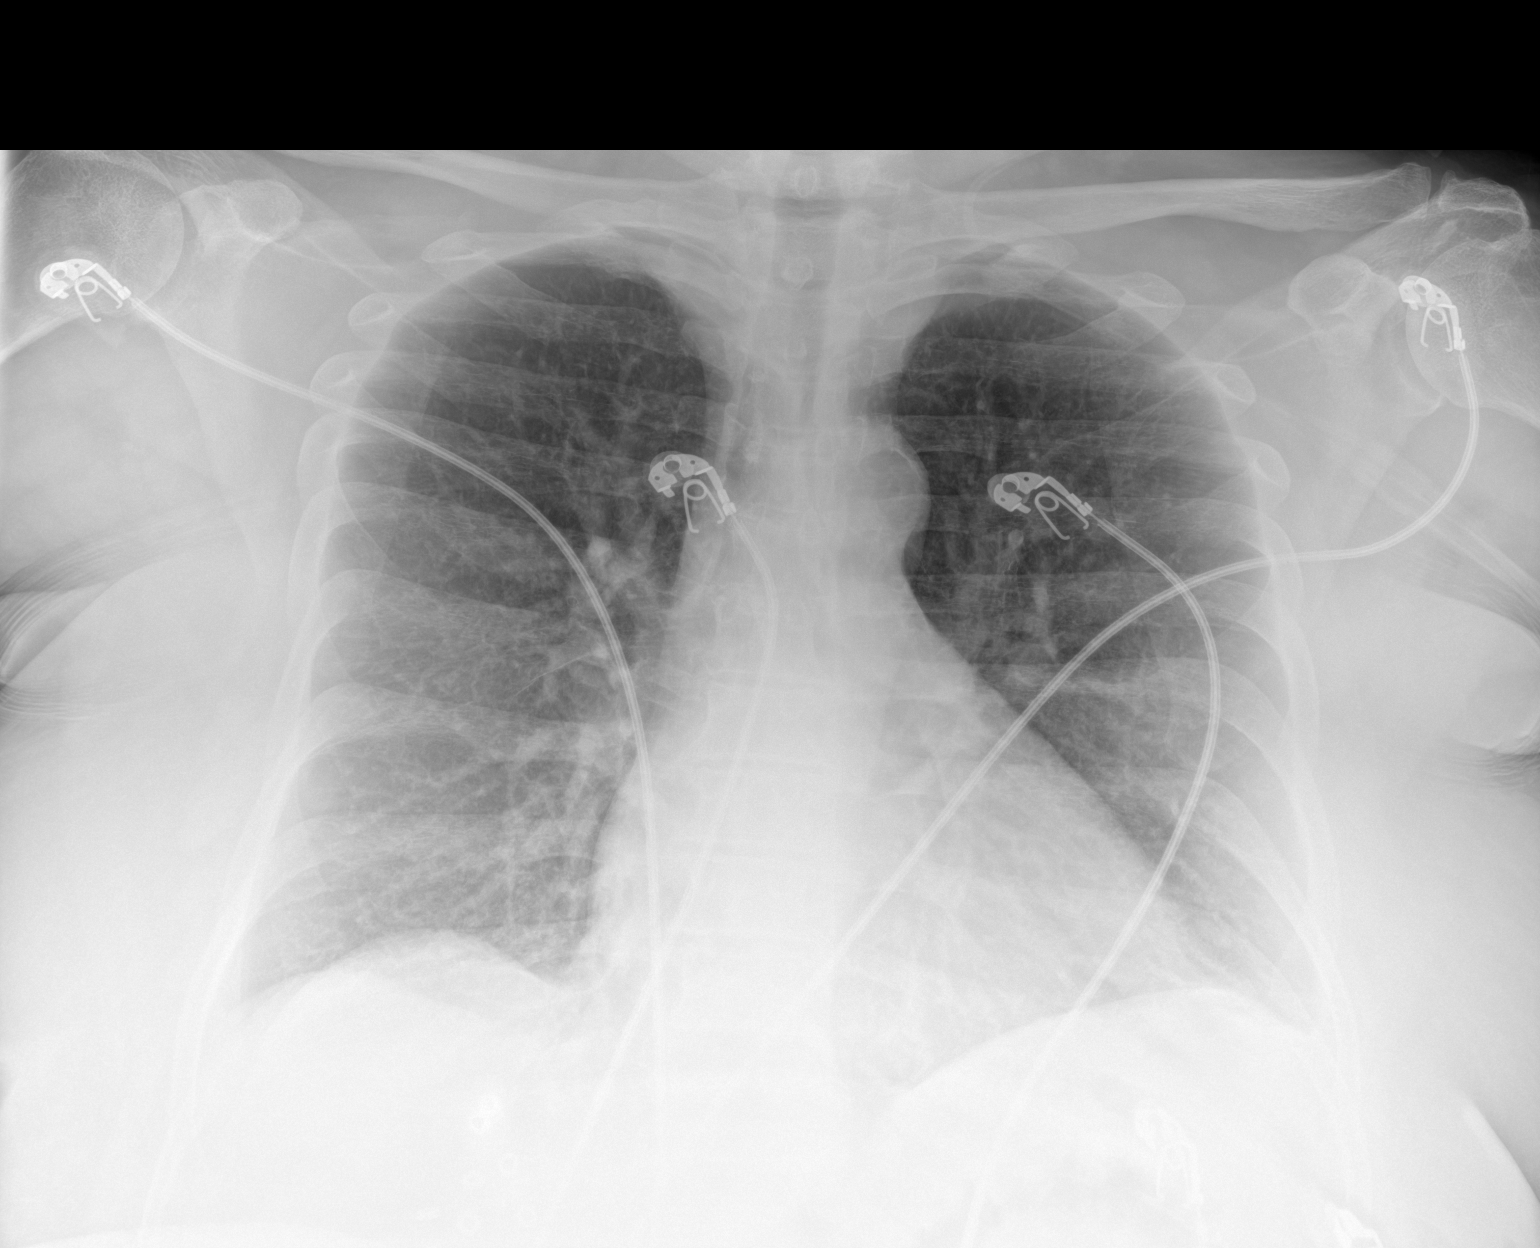

[1 of 1 positions shown; findings below may reference images not displayed]

FINDINGS: Heart size within normal limits. Aortic atherosclerosis. No
appreciable airspace consolidation or pulmonary edema. No evidence
of pleural effusion or pneumothorax. No acute bony abnormality
identified.
IMPRESSION: No evidence of acute cardiopulmonary abnormality.

Aortic Atherosclerosis ([id number]).

## 2023-12-22 DIAGNOSIS — E8729 Other acidosis: Secondary | ICD-10-CM | POA: Diagnosis not present

## 2023-12-22 DIAGNOSIS — J439 Emphysema, unspecified: Secondary | ICD-10-CM | POA: Diagnosis not present

## 2023-12-22 DIAGNOSIS — R5382 Chronic fatigue, unspecified: Secondary | ICD-10-CM | POA: Diagnosis not present

## 2024-07-25 DIAGNOSIS — J9801 Acute bronchospasm: Secondary | ICD-10-CM | POA: Diagnosis not present

## 2024-07-25 DIAGNOSIS — N183 Chronic kidney disease, stage 3 unspecified: Secondary | ICD-10-CM | POA: Diagnosis not present

## 2024-07-25 DIAGNOSIS — J441 Chronic obstructive pulmonary disease with (acute) exacerbation: Secondary | ICD-10-CM | POA: Diagnosis not present

## 2024-08-01 DIAGNOSIS — Z1389 Encounter for screening for other disorder: Secondary | ICD-10-CM | POA: Diagnosis not present

## 2024-08-01 DIAGNOSIS — Z0131 Encounter for examination of blood pressure with abnormal findings: Secondary | ICD-10-CM | POA: Diagnosis not present

## 2024-08-01 DIAGNOSIS — I10 Essential (primary) hypertension: Secondary | ICD-10-CM | POA: Diagnosis not present

## 2024-08-01 DIAGNOSIS — Z712 Person consulting for explanation of examination or test findings: Secondary | ICD-10-CM | POA: Diagnosis not present

## 2024-08-01 DIAGNOSIS — J449 Chronic obstructive pulmonary disease, unspecified: Secondary | ICD-10-CM | POA: Diagnosis not present

## 2024-08-01 DIAGNOSIS — E119 Type 2 diabetes mellitus without complications: Secondary | ICD-10-CM | POA: Diagnosis not present

## 2024-10-09 DIAGNOSIS — R062 Wheezing: Secondary | ICD-10-CM | POA: Diagnosis not present

## 2024-10-09 DIAGNOSIS — F17218 Nicotine dependence, cigarettes, with other nicotine-induced disorders: Secondary | ICD-10-CM | POA: Diagnosis not present

## 2024-10-09 DIAGNOSIS — J441 Chronic obstructive pulmonary disease with (acute) exacerbation: Secondary | ICD-10-CM | POA: Diagnosis not present
